# Patient Record
Sex: Male | Born: 1955 | Race: Black or African American | Hispanic: No | Marital: Married | Smoking: Current some day smoker
Health system: Southern US, Community
[De-identification: ages and names within clinical notes are randomized; demographics above are authoritative.]

## PROBLEM LIST (undated history)

## (undated) DIAGNOSIS — I77810 Thoracic aortic ectasia: Secondary | ICD-10-CM

## (undated) DIAGNOSIS — R600 Localized edema: Secondary | ICD-10-CM

## (undated) DIAGNOSIS — I5022 Chronic systolic (congestive) heart failure: Secondary | ICD-10-CM

## (undated) DIAGNOSIS — F419 Anxiety disorder, unspecified: Secondary | ICD-10-CM

## (undated) DIAGNOSIS — I513 Intracardiac thrombosis, not elsewhere classified: Secondary | ICD-10-CM

## (undated) DIAGNOSIS — E785 Hyperlipidemia, unspecified: Secondary | ICD-10-CM

## (undated) DIAGNOSIS — F1490 Cocaine use, unspecified, uncomplicated: Secondary | ICD-10-CM

## (undated) DIAGNOSIS — I480 Paroxysmal atrial fibrillation: Secondary | ICD-10-CM

## (undated) DIAGNOSIS — N1832 Chronic kidney disease, stage 3b: Secondary | ICD-10-CM

## (undated) DIAGNOSIS — Z91199 Patient's noncompliance with other medical treatment and regimen due to unspecified reason: Secondary | ICD-10-CM

## (undated) DIAGNOSIS — I428 Other cardiomyopathies: Secondary | ICD-10-CM

## (undated) DIAGNOSIS — I10 Essential (primary) hypertension: Secondary | ICD-10-CM

## (undated) DIAGNOSIS — I739 Peripheral vascular disease, unspecified: Secondary | ICD-10-CM

## (undated) DIAGNOSIS — I509 Heart failure, unspecified: Secondary | ICD-10-CM

## (undated) DIAGNOSIS — I340 Nonrheumatic mitral (valve) insufficiency: Secondary | ICD-10-CM

## (undated) HISTORY — DX: Thoracic aortic ectasia: I77.810

## (undated) HISTORY — DX: Anxiety disorder, unspecified: F41.9

## (undated) HISTORY — DX: Chronic kidney disease, stage 3b: N18.32

## (undated) HISTORY — DX: Chronic systolic (congestive) heart failure: I50.22

## (undated) HISTORY — DX: Essential (primary) hypertension: I10

## (undated) HISTORY — DX: Nonrheumatic mitral (valve) insufficiency: I34.0

## (undated) HISTORY — DX: Intracardiac thrombosis, not elsewhere classified: I51.3

## (undated) HISTORY — DX: Other cardiomyopathies: I42.8

## (undated) HISTORY — DX: Hyperlipidemia, unspecified: E78.5

## (undated) HISTORY — DX: Paroxysmal atrial fibrillation: I48.0

## (undated) HISTORY — DX: Patient's noncompliance with other medical treatment and regimen due to unspecified reason: Z91.199

## (undated) HISTORY — DX: Peripheral vascular disease, unspecified: I73.9

## (undated) HISTORY — PX: HERNIA REPAIR: SHX51

## (undated) HISTORY — DX: Cocaine use, unspecified, uncomplicated: F14.90

---

## 2001-12-11 ENCOUNTER — Emergency Department (HOSPITAL_COMMUNITY): Admission: EM | Admit: 2001-12-11 | Discharge: 2001-12-11 | Payer: Self-pay | Admitting: *Deleted

## 2001-12-11 ENCOUNTER — Encounter: Payer: Self-pay | Admitting: *Deleted

## 2003-10-16 ENCOUNTER — Emergency Department (HOSPITAL_COMMUNITY): Admission: EM | Admit: 2003-10-16 | Discharge: 2003-10-16 | Payer: Self-pay | Admitting: Emergency Medicine

## 2006-06-28 ENCOUNTER — Encounter: Payer: Self-pay | Admitting: Emergency Medicine

## 2006-06-28 ENCOUNTER — Inpatient Hospital Stay (HOSPITAL_COMMUNITY): Admission: AD | Admit: 2006-06-28 | Discharge: 2006-07-03 | Payer: Self-pay | Admitting: General Surgery

## 2006-07-07 ENCOUNTER — Emergency Department (HOSPITAL_COMMUNITY): Admission: EM | Admit: 2006-07-07 | Discharge: 2006-07-07 | Payer: Self-pay | Admitting: Hematology and Oncology

## 2006-07-11 ENCOUNTER — Emergency Department (HOSPITAL_COMMUNITY): Admission: EM | Admit: 2006-07-11 | Discharge: 2006-07-11 | Payer: Self-pay | Admitting: Emergency Medicine

## 2008-11-13 ENCOUNTER — Emergency Department (HOSPITAL_COMMUNITY): Admission: EM | Admit: 2008-11-13 | Discharge: 2008-11-13 | Payer: Self-pay | Admitting: Emergency Medicine

## 2010-02-08 ENCOUNTER — Encounter: Payer: Self-pay | Admitting: General Surgery

## 2010-04-23 LAB — POCT I-STAT, CHEM 8
BUN: 12 mg/dL (ref 6–23)
Calcium, Ion: 1.07 mmol/L — ABNORMAL LOW (ref 1.12–1.32)
Chloride: 105 mEq/L (ref 96–112)
Creatinine, Ser: 1 mg/dL (ref 0.4–1.5)
Glucose, Bld: 131 mg/dL — ABNORMAL HIGH (ref 70–99)
HCT: 37 % — ABNORMAL LOW (ref 39.0–52.0)
Hemoglobin: 12.6 g/dL — ABNORMAL LOW (ref 13.0–17.0)
Potassium: 3.3 mEq/L — ABNORMAL LOW (ref 3.5–5.1)
Sodium: 140 mEq/L (ref 135–145)
TCO2: 22 mmol/L (ref 0–100)
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
## 2010-11-05 LAB — CBC
HCT: 39.8
HCT: 41.5
HCT: 42.1
Hemoglobin: 13.6
Hemoglobin: 14.6
Hemoglobin: 14.6
MCHC: 34
MCHC: 34.6
MCHC: 35.3
MCV: 94.3
MCV: 94.4
MCV: 96.1
Platelets: 202
Platelets: 260
Platelets: 269
RBC: 4.15 — ABNORMAL LOW
RBC: 4.39
RBC: 4.46
RDW: 13.2
RDW: 13.3
RDW: 13.6
WBC: 10
WBC: 12 — ABNORMAL HIGH
WBC: 14.1 — ABNORMAL HIGH

## 2010-11-05 LAB — BASIC METABOLIC PANEL
BUN: 12
BUN: 3 — ABNORMAL LOW
BUN: 8
CO2: 20
CO2: 27
CO2: 29
Calcium: 8.1 — ABNORMAL LOW
Calcium: 8.2 — ABNORMAL LOW
Calcium: 8.3 — ABNORMAL LOW
Chloride: 103
Chloride: 107
Chloride: 108
Creatinine, Ser: 0.87
Creatinine, Ser: 0.96
Creatinine, Ser: 1.11
GFR calc Af Amer: >60
GFR calc Af Amer: >60
GFR calc Af Amer: >60
GFR calc non Af Amer: >60
GFR calc non Af Amer: >60
GFR calc non Af Amer: >60
Glucose, Bld: 119 — ABNORMAL HIGH
Glucose, Bld: 132 — ABNORMAL HIGH
Glucose, Bld: 135 — ABNORMAL HIGH
Potassium: 3.4 — ABNORMAL LOW
Potassium: 3.4 — ABNORMAL LOW
Potassium: 3.9
Sodium: 137
Sodium: 138
Sodium: 139

## 2010-11-05 LAB — URINALYSIS, ROUTINE W REFLEX MICROSCOPIC
Bilirubin Urine: NEGATIVE
Glucose, UA: NEGATIVE
Hgb urine dipstick: NEGATIVE
Ketones, ur: NEGATIVE
Nitrite: NEGATIVE
Protein, ur: NEGATIVE
Specific Gravity, Urine: 1.02
Urobilinogen, UA: 1
pH: 6

## 2010-11-05 LAB — DIFFERENTIAL
Basophils Absolute: 0
Basophils Relative: 0
Eosinophils Absolute: 0
Eosinophils Relative: 0
Lymphocytes Relative: 17
Lymphs Abs: 2.1
Monocytes Absolute: 0.7
Monocytes Relative: 6
Neutro Abs: 9.2 — ABNORMAL HIGH
Neutrophils Relative %: 76

## 2010-11-05 LAB — RAPID URINE DRUG SCREEN, HOSP PERFORMED
Amphetamines: NOT DETECTED
Barbiturates: NOT DETECTED
Benzodiazepines: NOT DETECTED
Cocaine: POSITIVE — AB
Opiates: POSITIVE — AB
Tetrahydrocannabinol: NOT DETECTED

## 2010-11-05 LAB — PROTIME-INR
INR: 1
Prothrombin Time: 13.2

## 2010-11-05 LAB — HEPATIC FUNCTION PANEL
ALT: 27
AST: 26
Albumin: 3.3 — ABNORMAL LOW
Alkaline Phosphatase: 76
Bilirubin, Direct: 0.2
Indirect Bilirubin: 0.3
Total Bilirubin: 0.5
Total Protein: 6.6

## 2010-11-05 LAB — ETHANOL: Alcohol, Ethyl (B): 113 — ABNORMAL HIGH

## 2010-11-05 LAB — APTT: aPTT: 29

## 2011-07-07 ENCOUNTER — Inpatient Hospital Stay (HOSPITAL_COMMUNITY)
Admission: EM | Admit: 2011-07-07 | Discharge: 2011-07-10 | DRG: 287 | Disposition: A | Discharge status: Home / Self Care | Payer: 59 | Source: Ambulatory Visit | Attending: Family Medicine | Admitting: Family Medicine

## 2011-07-07 ENCOUNTER — Encounter (HOSPITAL_COMMUNITY): Payer: Self-pay | Admitting: Emergency Medicine

## 2011-07-07 ENCOUNTER — Emergency Department (HOSPITAL_COMMUNITY): Payer: 59

## 2011-07-07 DIAGNOSIS — E119 Type 2 diabetes mellitus without complications: Secondary | ICD-10-CM | POA: Diagnosis present

## 2011-07-07 DIAGNOSIS — I4729 Other ventricular tachycardia: Secondary | ICD-10-CM | POA: Diagnosis present

## 2011-07-07 DIAGNOSIS — E1169 Type 2 diabetes mellitus with other specified complication: Secondary | ICD-10-CM

## 2011-07-07 DIAGNOSIS — J90 Pleural effusion, not elsewhere classified: Secondary | ICD-10-CM | POA: Diagnosis present

## 2011-07-07 DIAGNOSIS — I5022 Chronic systolic (congestive) heart failure: Secondary | ICD-10-CM

## 2011-07-07 DIAGNOSIS — I452 Bifascicular block: Secondary | ICD-10-CM | POA: Diagnosis present

## 2011-07-07 DIAGNOSIS — E1121 Type 2 diabetes mellitus with diabetic nephropathy: Secondary | ICD-10-CM

## 2011-07-07 DIAGNOSIS — R791 Abnormal coagulation profile: Secondary | ICD-10-CM | POA: Diagnosis present

## 2011-07-07 DIAGNOSIS — I10 Essential (primary) hypertension: Secondary | ICD-10-CM

## 2011-07-07 DIAGNOSIS — I472 Ventricular tachycardia, unspecified: Secondary | ICD-10-CM | POA: Diagnosis present

## 2011-07-07 DIAGNOSIS — I509 Heart failure, unspecified: Secondary | ICD-10-CM | POA: Diagnosis present

## 2011-07-07 DIAGNOSIS — I5021 Acute systolic (congestive) heart failure: Principal | ICD-10-CM

## 2011-07-07 DIAGNOSIS — I428 Other cardiomyopathies: Secondary | ICD-10-CM | POA: Diagnosis present

## 2011-07-07 DIAGNOSIS — R0789 Other chest pain: Secondary | ICD-10-CM | POA: Diagnosis present

## 2011-07-07 DIAGNOSIS — F17200 Nicotine dependence, unspecified, uncomplicated: Secondary | ICD-10-CM | POA: Diagnosis present

## 2011-07-07 HISTORY — DX: Localized edema: R60.0

## 2011-07-07 LAB — CBC
HCT: 44.3 % (ref 39.0–52.0)
Hemoglobin: 15 g/dL (ref 13.0–17.0)
MCH: 33.2 pg (ref 26.0–34.0)
MCHC: 33.9 g/dL (ref 30.0–36.0)
MCV: 98 fL (ref 78.0–100.0)
Platelets: 189 10*3/uL (ref 150–400)
RBC: 4.52 MIL/uL (ref 4.22–5.81)
RDW: 14.3 % (ref 11.5–15.5)
WBC: 7.3 10*3/uL (ref 4.0–10.5)

## 2011-07-07 LAB — DIFFERENTIAL
Basophils Absolute: 0 10*3/uL (ref 0.0–0.1)
Basophils Relative: 0 % (ref 0–1)
Eosinophils Absolute: 0 10*3/uL (ref 0.0–0.7)
Eosinophils Relative: 0 % (ref 0–5)
Lymphocytes Relative: 22 % (ref 12–46)
Lymphs Abs: 1.6 10*3/uL (ref 0.7–4.0)
Monocytes Absolute: 0.7 10*3/uL (ref 0.1–1.0)
Monocytes Relative: 9 % (ref 3–12)
Neutro Abs: 5 10*3/uL (ref 1.7–7.7)
Neutrophils Relative %: 68 % (ref 43–77)

## 2011-07-07 LAB — BASIC METABOLIC PANEL
BUN: 7 mg/dL (ref 6–23)
CO2: 20 mEq/L (ref 19–32)
Calcium: 8.5 mg/dL (ref 8.4–10.5)
Chloride: 105 mEq/L (ref 96–112)
Creatinine, Ser: 0.98 mg/dL (ref 0.50–1.35)
GFR calc Af Amer: >90 mL/min (ref >90)
GFR calc non Af Amer: >90 mL/min (ref >90)
Glucose, Bld: 144 mg/dL — ABNORMAL HIGH (ref 70–99)
Potassium: 3.6 mEq/L (ref 3.5–5.1)
Sodium: 138 mEq/L (ref 135–145)

## 2011-07-07 LAB — PRO B NATRIURETIC PEPTIDE: Pro B Natriuretic peptide (BNP): 6821 pg/mL — ABNORMAL HIGH (ref 0–125)

## 2011-07-07 LAB — TROPONIN I: Troponin I: <0.3 ng/mL (ref <0.30)

## 2011-07-07 MED ORDER — POTASSIUM CHLORIDE CRYS ER 10 MEQ PO TBCR
10.0000 meq | EXTENDED_RELEASE_TABLET | Freq: Every day | ORAL | Status: DC
  Administered 2011-07-07 – 2011-07-10 (×3): 10 meq via ORAL
  Filled 2011-07-07 (×3): qty 1

## 2011-07-07 MED ORDER — FUROSEMIDE 10 MG/ML IJ SOLN: 40.0000 mg | Freq: Once | INTRAMUSCULAR | Status: DC

## 2011-07-07 MED ORDER — ALPRAZOLAM 0.5 MG PO TABS
0.5000 mg | ORAL_TABLET | Freq: Three times a day (TID) | ORAL | Status: DC | PRN
  Administered 2011-07-07 – 2011-07-09 (×4): 0.5 mg via ORAL
  Filled 2011-07-07 (×4): qty 1

## 2011-07-07 MED ORDER — IPRATROPIUM BROMIDE 0.02 % IN SOLN
0.5000 mg | Freq: Once | RESPIRATORY_TRACT | Status: AC
  Administered 2011-07-07: 0.5 mg via RESPIRATORY_TRACT
  Filled 2011-07-07: qty 2.5

## 2011-07-07 MED ORDER — SODIUM CHLORIDE 0.9 % IV SOLN: INTRAVENOUS | Status: DC

## 2011-07-07 MED ORDER — SODIUM CHLORIDE 0.9 % IV SOLN: 250.0000 mL | INTRAVENOUS | Status: DC | PRN

## 2011-07-07 MED ORDER — FUROSEMIDE 10 MG/ML IJ SOLN
40.0000 mg | Freq: Once | INTRAMUSCULAR | Status: AC
Start: 2011-07-07 — End: 2011-07-07
  Administered 2011-07-07: 40 mg via INTRAVENOUS
  Filled 2011-07-07: qty 4

## 2011-07-07 MED ORDER — LOSARTAN POTASSIUM 50 MG PO TABS
50.0000 mg | ORAL_TABLET | Freq: Every day | ORAL | Status: DC
  Administered 2011-07-07 – 2011-07-10 (×4): 50 mg via ORAL
  Filled 2011-07-07 (×4): qty 1

## 2011-07-07 MED ORDER — ASPIRIN 81 MG PO CHEW: 324.0000 mg | CHEWABLE_TABLET | ORAL | Status: DC

## 2011-07-07 MED ORDER — CARVEDILOL 6.25 MG PO TABS
6.2500 mg | ORAL_TABLET | Freq: Two times a day (BID) | ORAL | Status: DC
  Administered 2011-07-07 – 2011-07-10 (×6): 6.25 mg via ORAL
  Filled 2011-07-07: qty 1
  Filled 2011-07-07: qty 2
  Filled 2011-07-07: qty 1
  Filled 2011-07-07: qty 2
  Filled 2011-07-07 (×2): qty 1
  Filled 2011-07-07: qty 2
  Filled 2011-07-07: qty 1

## 2011-07-07 MED ORDER — ALBUTEROL SULFATE (5 MG/ML) 0.5% IN NEBU
2.5000 mg | INHALATION_SOLUTION | Freq: Once | RESPIRATORY_TRACT | Status: AC
  Administered 2011-07-07: 2.5 mg via RESPIRATORY_TRACT
  Filled 2011-07-07: qty 0.5

## 2011-07-07 MED ORDER — METFORMIN HCL 500 MG PO TABS
500.0000 mg | ORAL_TABLET | Freq: Every day | ORAL | Status: DC
  Administered 2011-07-07 – 2011-07-08 (×2): 500 mg via ORAL
  Filled 2011-07-07 (×3): qty 1

## 2011-07-07 MED ORDER — ONDANSETRON HCL 4 MG/2ML IJ SOLN: 4.0000 mg | Freq: Three times a day (TID) | INTRAMUSCULAR | Status: AC | PRN

## 2011-07-07 MED ORDER — SODIUM CHLORIDE 0.9 % IJ SOLN: 3.0000 mL | INTRAMUSCULAR | Status: DC | PRN

## 2011-07-07 MED ORDER — SODIUM CHLORIDE 0.9 % IJ SOLN
3.0000 mL | Freq: Two times a day (BID) | INTRAMUSCULAR | Status: DC
  Administered 2011-07-07 – 2011-07-08 (×3): 3 mL via INTRAVENOUS
  Filled 2011-07-07 (×2): qty 3

## 2011-07-07 MED ORDER — CLOPIDOGREL BISULFATE 75 MG PO TABS
75.0000 mg | ORAL_TABLET | Freq: Once | ORAL | Status: AC
  Administered 2011-07-08: 75 mg via ORAL
  Filled 2011-07-07: qty 1

## 2011-07-07 MED ORDER — SODIUM CHLORIDE 0.45 % IV SOLN
INTRAVENOUS | Status: DC
  Administered 2011-07-07: 12:00:00 via INTRAVENOUS

## 2011-07-07 MED ORDER — FUROSEMIDE 10 MG/ML IJ SOLN
40.0000 mg | Freq: Two times a day (BID) | INTRAMUSCULAR | Status: DC
  Administered 2011-07-07 – 2011-07-09 (×5): 40 mg via INTRAVENOUS
  Filled 2011-07-07 (×7): qty 4

## 2011-07-07 NOTE — ED Notes (Signed)
Patient complaining of shortness of breath since last night. Also complaining of cough x 2 weeks. Denies pain.

## 2011-07-07 NOTE — H&P (Signed)
NAME:  [name] [name], [name] [name]              ACCOUNT NO.:  [id number]  MEDICAL RECORD NO.:  [id number]  LOCATION:  A335                          FACILITY:  [facility]  PHYSICIAN:  Avonda Toso G. Renard Matter, MD   DATE OF BIRTH:  10/28/1955  DATE OF ADMISSION:  07/07/2011 DATE OF DISCHARGE:  LH                             HISTORY & PHYSICAL   This 56 year old Afro American male was admitted through the ED with a chief complaint being chest tightness and shortness of breath which had been present for several days.  The patient was seen and evaluated by ED physician.  It was noted he had a nonproductive cough and dyspnea with exertion and when he attempts to lie down flat, he has some more shortness of breath.  X-rays at the time of his admission showed evidence of an enlarged cardiac silhouette with pulmonary vascular congestion.  Bibasilar atelectasis with small pleural effusions.  The patient did have an elevated proBNP 6821.  He was felt that he was in congestive heart failure.  Subsequently was admitted.  An EKG done showed frequent PVCs, right bundle branch block, left anterior fascicular block.  The patient was started on IV furosemide on admission.  SOCIAL HISTORY:  The patient does smoke approximately half a pack a day. Does use alcohol on a regular basis.  PAST MEDICAL HISTORY:  The patient does have a history of diabetes mellitus type 2.  PAST SURGICAL HISTORY:  The patient has a history of hernia repair.  ALLERGIES:  The patient has no known allergies.  REVIEW OF SYSTEMS:  HEENT:  Negative.  CARDIOPULMONARY:  The patient has some shortness of breath and some orthopnea.  GI:  No nausea, vomiting, or diarrhea.  GU:  No dysuria, hematuria.  EXAMINATION:  GENERAL:  Alert male. VITAL SIGNS:  Blood pressure 144/78, respirations 18, pulse 47, temp 97.8. HEENT:  Eyes, PERRLA.  TMs negative. OROPHARYNX:  Benign. NECK:  Supple.  No JVD or thyroid abnormalities. LUNGS:  Clear to P and A. HEART:   Regular rhythm.  No murmurs. ABDOMEN:  No palpable organs or masses.  No organomegaly. EXTREMITIES:  1+ edema bilaterally. NEUROLOGIC:  Cranial nerves intact.  No motor or sensory abnormalities. No abnormal reflexes.  ASSESSMENT:  The patient was admitted with what was felt to be congestive heart failure.  PLAN:  To obtain Cardiology consult to start IV furosemide for diuresis. Continue to monitor daily weights.  MEDICATION LIST:  Metformin 500 mg daily.  Potassium chloride 10 mEq daily.     Syd Manges G. Renard Matter, MD     AGM/MEDQ  D:  07/07/2011  T:  07/07/2011  Job:  [id number]

## 2011-07-07 NOTE — Consult Note (Signed)
Reason for Consult:CHF Referring Physician: [name] is an 56 y.o. male.  [name] is a very pleasant 56 year old African American male patient with no prior cardiac history who was admitted with congestive heart failure. He complains of 2-3 day history of cough that gradually worsened and shortness of breath when laying down. He was walking up a flight of stairs at work today where he'd completely gave out of breath so drove himself to the emergency room. His BNP is 6821 and chest x-ray shows pulmonary vascular congestion. The patient does admit to several week history of chest tightness when doing his yard work. He attributed this to the pollen.   Patient has history of diabetes mellitus and smokes a half a pack of cigarettes a day for the past 20 years he also drinks 5-6 beers daily. Has a family history of coronary artery disease. He's had occasional leg swelling over the years.  Past Medical History  Diagnosis Date  . Diabetes mellitus   . Fluid retention in legs     Past Surgical History  Procedure Date  . Hernia repair     History reviewed. No pertinent family history.  Social History:  reports that he has been smoking.  He does not have any smokeless tobacco history on file. He reports that he drinks alcohol. He reports that he does not use illicit drugs.  Allergies: No Known Allergies  Medications:  Scheduled Meds:   . albuterol  2.5 mg Nebulization Once  . furosemide  40 mg Intravenous Once  . furosemide  40 mg Intravenous BID  . ipratropium  0.5 mg Nebulization Once  . metFORMIN  500 mg Oral Q breakfast  . potassium chloride  10 mEq Oral Daily  . DISCONTD: furosemide  40 mg Intramuscular Once   Continuous Infusions:   . sodium chloride 50 mL/hr at 07/07/11 1155   PRN Meds:.ALPRAZolam, ondansetron (ZOFRAN) IV   Results for orders placed during the hospital encounter of 07/07/11 (from the past 48 hour(s))  CBC     Status: Normal   Collection Time    07/07/11  6:59 AM      Component Value Range Comment   WBC 7.3  4.0 - 10.5 K/uL    RBC 4.52  4.22 - 5.81 MIL/uL    Hemoglobin 15.0  13.0 - 17.0 g/dL    HCT 45.4  09.8 - 11.9 %    MCV 98.0  78.0 - 100.0 fL    MCH 33.2  26.0 - 34.0 pg    MCHC 33.9  30.0 - 36.0 g/dL    RDW 14.7  82.9 - 56.2 %    Platelets 189  150 - 400 K/uL   DIFFERENTIAL     Status: Normal   Collection Time   07/07/11  6:59 AM      Component Value Range Comment   Neutrophils Relative 68  43 - 77 %    Neutro Abs 5.0  1.7 - 7.7 K/uL    Lymphocytes Relative 22  12 - 46 %    Lymphs Abs 1.6  0.7 - 4.0 K/uL    Monocytes Relative 9  3 - 12 %    Monocytes Absolute 0.7  0.1 - 1.0 K/uL    Eosinophils Relative 0  0 - 5 %    Eosinophils Absolute 0.0  0.0 - 0.7 K/uL    Basophils Relative 0  0 - 1 %    Basophils Absolute 0.0  0.0 - 0.1 K/uL  BASIC METABOLIC PANEL     Status: Abnormal   Collection Time   07/07/11  6:59 AM      Component Value Range Comment   Sodium 138  135 - 145 mEq/L    Potassium 3.6  3.5 - 5.1 mEq/L    Chloride 105  96 - 112 mEq/L    CO2 20  19 - 32 mEq/L    Glucose, Bld 144 (*) 70 - 99 mg/dL    BUN 7  6 - 23 mg/dL    Creatinine, Ser 1.61  0.50 - 1.35 mg/dL    Calcium 8.5  8.4 - 09.6 mg/dL    GFR calc non Af Amer >90  >90 mL/min    GFR calc Af Amer >90  >90 mL/min   PRO B NATRIURETIC PEPTIDE     Status: Abnormal   Collection Time   07/07/11  6:59 AM      Component Value Range Comment   Pro B Natriuretic peptide (BNP) 6821.0 (*) 0 - 125 pg/mL   TROPONIN I     Status: Normal   Collection Time   07/07/11  6:59 AM      Component Value Range Comment   Troponin I <0.30  <0.30 ng/mL     Dg Chest 2 View  07/07/2011  *RADIOLOGY REPORT*  Clinical Data: Shortness of breath, smoker, cough  CHEST - 2 VIEW  Comparison: 06/28/2006  Findings: Mild enlargement of cardiac silhouette. Pulmonary vascular congestion. Small pleural effusions and bibasilar atelectasis. Peribronchial thickening. No pneumothorax.  Unremarkable.  IMPRESSION: Enlargement of cardiac silhouette with pulmonary vascular congestion. Bibasilar atelectasis and small pleural effusions.  Original Report Authenticated By: Lollie Marrow, M.D.    ROS See HPI Eyes: Negative Ears:Negative for hearing loss, tinnitus Cardiovascular: He has occasional palpitations when he gets anxious,Negative for near-syncope, orthopnea, paroxysmal nocturnal dyspnea and syncope, claudication, cyanosis,.  Respiratory:   Negative for hemoptysis, shortness of breath, sleep disturbances due to breathing, sputum production and wheezing.   Endocrine: Negative for cold intolerance and heat intolerance.  Hematologic/Lymphatic: Negative for adenopathy and bleeding problem. Does not bruise/bleed easily.  Musculoskeletal: Negative.   Gastrointestinal: Negative for nausea, vomiting, reflux, abdominal pain, diarrhea, constipation.   Genitourinary: Negative for bladder incontinence, dysuria, flank pain, frequency, hematuria, hesitancy, nocturia and urgency.  Neurological: Negative.  Allergic/Immunologic: Negative for environmental allergies.  Blood pressure 151/90, pulse 80, temperature 98.2 F (36.8 C), resp. rate 18, height 6' (1.829 m), weight 199 lb 4.8 oz (90.402 kg), SpO2 97.00%. Physical Exam PHYSICAL EXAM: Well-nournished, in no acute distress. Neck: slight increased JVD, no HJR, Bruit, or thyroid enlargement Lungs: decreased breath sounds with bibasilar Rales right greater than left Cardiovascular: RRR with some skipping, positive S3 and S4 gallop, 1-2/6 systolic murmur at the left sternal border, no bruit, thrill, or heave. Abdomen: BS normal. Soft without organomegaly, masses, lesions or tenderness. Extremities: without cyanosis, clubbing or edema. Good distal pulses bilateral SKin: Warm, no lesions or rashes  Musculoskeletal: No deformities Neuro: no focal signs  EAV:WUJWJX sinus rhythm with frequent PVCs, incomplete right bundle branch  block  Assessment/Plan: CHF: we'll need to diurese, stop IV fluids, and check 2-D echo for LV function. Could be ischemic. Hypertension: patient has no history of high blood pressure but hasn't been elevated since admission. Add low dose Coreg. Chest pain worrisome for ischemia: will likely need further assessment with cardiac catheterization. He is on the schedule for this Friday, June 21st for right and left heart cath with Dr. [place]  at 12:00 Diabetes mellitus Tobacco abuse: smoking cessation. Patient would like to try Chantix. ETOH: have discussed decreasing intake.  Jacolyn Reedy 07/07/2011, 12:04 PM    Patient examined and chart reviewed.  Long discussion with patient and wife.  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  Responding to diuresis with less orthopnea and edema.  Echo pending from today but suspect EF will be at least modrately reduced.  Tentatively planned right and left heart cath in [place] on Friday with Dr [place].  Indicated to patient that heart will not improve unless he stops drinking.  Can be transferred tomorrow or day of cath.  Continue to titrate meds.  Pre cath orders written and case scheduled in [facility] around 12:30 6/21  Charlton Haws 3:27 PM 07/07/2011

## 2011-07-07 NOTE — ED Provider Notes (Signed)
History     CSN: [id number]  Arrival date & time 07/07/11  6213   First MD Initiated Contact with Patient 07/07/11 [id number]      Chief Complaint  Patient presents with  . Shortness of Breath    (Consider location/radiation/quality/duration/timing/severity/associated sxs/prior treatment) Patient is a 56 y.o. male presenting with shortness of breath. The history is provided by the patient.  Shortness of Breath  Associated symptoms include shortness of breath.  He has been having shortness of breath and cough for the last 2 days. Cough is nonproductive. He denies fever, chills, sweats. There has been some mild tightness in his chest. He denies nausea, vomiting, diaphoresis. He thinks that he's been exposed to too much pollen. Dyspnea is worse if he lays flat and if he exerts himself. Nothing makes the symptoms any better. He has not taken any medication for her.  Past Medical History  Diagnosis Date  . Diabetes mellitus   . Fluid retention in legs     Past Surgical History  Procedure Date  . Hernia repair     History reviewed. No pertinent family history.  History  Substance Use Topics  . Smoking status: Current Everyday Smoker -- 0.5 packs/day  . Smokeless tobacco: Not on file  . Alcohol Use: Yes     weekends      Review of Systems  Respiratory: Positive for shortness of breath.   All other systems reviewed and are negative.    Allergies  Review of patient's allergies indicates no known allergies.  Home Medications   Current Outpatient Rx  Name Route Sig Dispense Refill  . METFORMIN HCL 500 MG PO TABS Oral Take 500 mg by mouth daily.    Marland Kitchen POTASSIUM CHLORIDE CRYS ER 10 MEQ PO TBCR Oral Take 10 mEq by mouth daily.      BP 158/117  Pulse 96  Temp 97.8 F (36.6 C)  Resp 20  Ht 6' (1.829 m)  Wt 215 lb (97.523 kg)  BMI 29.16 kg/m2  SpO2 95%  Physical Exam  Nursing note and vitals reviewed.  56 year old male who is resting comfortably and in no acute  distress. Vital signs are significant for hypertension with blood pressure 158/117. Oxygen saturation is 95% which is normal. Head is normocephalic and atraumatic. PERRLA, EOMI. Neck is nontender and supple. No JVD seen. Back is nontender. Lungs have a slightly prolonged exhalation phase but no overt rales, wheezes, or rhonchi. Heart has regular rate and rhythm without murmur. Abdomen is soft, flat, nontender without masses or hepatosplenomegaly. Extremities have 1+ edema, no cyanosis. Skin is warm and dry without rash. Neurologic: Mental status is normal, cranial nerves are intact, there are no motor or sensory deficits.  ED Course  Procedures (including critical care time)  Results for orders placed during the hospital encounter of 07/07/11  CBC      Component Value Range   WBC 7.3  4.0 - 10.5 K/uL   RBC 4.52  4.22 - 5.81 MIL/uL   Hemoglobin 15.0  13.0 - 17.0 g/dL   HCT 78.4  69.6 - 29.5 %   MCV 98.0  78.0 - 100.0 fL   MCH 33.2  26.0 - 34.0 pg   MCHC 33.9  30.0 - 36.0 g/dL   RDW 28.4  13.2 - 44.0 %   Platelets 189  150 - 400 K/uL  DIFFERENTIAL      Component Value Range   Neutrophils Relative 68  43 - 77 %   Neutro Abs 5.0  1.7 - 7.7 K/uL   Lymphocytes Relative 22  12 - 46 %   Lymphs Abs 1.6  0.7 - 4.0 K/uL   Monocytes Relative 9  3 - 12 %   Monocytes Absolute 0.7  0.1 - 1.0 K/uL   Eosinophils Relative 0  0 - 5 %   Eosinophils Absolute 0.0  0.0 - 0.7 K/uL   Basophils Relative 0  0 - 1 %   Basophils Absolute 0.0  0.0 - 0.1 K/uL  BASIC METABOLIC PANEL      Component Value Range   Sodium 138  135 - 145 mEq/L   Potassium 3.6  3.5 - 5.1 mEq/L   Chloride 105  96 - 112 mEq/L   CO2 20  19 - 32 mEq/L   Glucose, Bld 144 (*) 70 - 99 mg/dL   BUN 7  6 - 23 mg/dL   Creatinine, Ser 1.61  0.50 - 1.35 mg/dL   Calcium 8.5  8.4 - 09.6 mg/dL   GFR calc non Af Amer >90  >90 mL/min   GFR calc Af Amer >90  >90 mL/min  PRO B NATRIURETIC PEPTIDE      Component Value Range   Pro B Natriuretic  peptide (BNP) 6821.0 (*) 0 - 125 pg/mL  TROPONIN I      Component Value Range   Troponin I <0.30  <0.30 ng/mL   Dg Chest 2 View  07/07/2011  *RADIOLOGY REPORT*  Clinical Data: Shortness of breath, smoker, cough  CHEST - 2 VIEW  Comparison: 06/28/2006  Findings: Mild enlargement of cardiac silhouette. Pulmonary vascular congestion. Small pleural effusions and bibasilar atelectasis. Peribronchial thickening. No pneumothorax. Unremarkable.  IMPRESSION: Enlargement of cardiac silhouette with pulmonary vascular congestion. Bibasilar atelectasis and small pleural effusions.  Original Report Authenticated By: Lollie Marrow, M.D.      Date: 07/07/2011  Rate: 94  Rhythm: normal sinus rhythm and premature ventricular contractions (PVC)  QRS Axis: left  Intervals: QT prolonged  ST/T Wave abnormalities: normal  Conduction Disutrbances right bundle branch block, left anterior fascicular block  Narrative Interpretation: Frequent PVCs, right bundle branch block, left anterior fascicular block. When compared with ECG of 06/28/2006, right bundle-branch block and PVCs are now present.  Old EKG Reviewed: changes noted    1. CHF (congestive heart failure)       MDM  Dyspnea which seems most consistent with acute bronchitis. Chest x-ray and BNP will be obtained to rule out CHF. He will be given a therapeutic trial of albuterol with Atrovent.  He got no relief with albuterol and Atrovent. Laboratory workup and chest x-ray seem most consistent with CHF. Since this is new onset CHF, he should be admitted. Case is discussed with Dr. Renard Matter who agrees to admit him. Treatment was started in the emergency department with a dose of Lasix.      Dione Booze, MD 07/07/11 [phone]

## 2011-07-08 ENCOUNTER — Encounter (HOSPITAL_COMMUNITY): Payer: Self-pay | Admitting: Adult Health

## 2011-07-08 DIAGNOSIS — I517 Cardiomegaly: Secondary | ICD-10-CM

## 2011-07-08 DIAGNOSIS — I509 Heart failure, unspecified: Secondary | ICD-10-CM

## 2011-07-08 DIAGNOSIS — I10 Essential (primary) hypertension: Secondary | ICD-10-CM

## 2011-07-08 DIAGNOSIS — E1169 Type 2 diabetes mellitus with other specified complication: Secondary | ICD-10-CM

## 2011-07-08 DIAGNOSIS — E1121 Type 2 diabetes mellitus with diabetic nephropathy: Secondary | ICD-10-CM

## 2011-07-08 DIAGNOSIS — I5022 Chronic systolic (congestive) heart failure: Secondary | ICD-10-CM

## 2011-07-08 DIAGNOSIS — E119 Type 2 diabetes mellitus without complications: Secondary | ICD-10-CM

## 2011-07-08 LAB — GLUCOSE, CAPILLARY: Glucose-Capillary: 91 mg/dL (ref 70–99)

## 2011-07-08 LAB — BASIC METABOLIC PANEL
BUN: 10 mg/dL (ref 6–23)
CO2: 25 mEq/L (ref 19–32)
Calcium: 8.7 mg/dL (ref 8.4–10.5)
Chloride: 103 mEq/L (ref 96–112)
Creatinine, Ser: 1.21 mg/dL (ref 0.50–1.35)
GFR calc Af Amer: 77 mL/min — ABNORMAL LOW (ref >90)
GFR calc non Af Amer: 66 mL/min — ABNORMAL LOW (ref >90)
Glucose, Bld: 123 mg/dL — ABNORMAL HIGH (ref 70–99)
Potassium: 3.3 mEq/L — ABNORMAL LOW (ref 3.5–5.1)
Sodium: 140 mEq/L (ref 135–145)

## 2011-07-08 MED ORDER — SODIUM CHLORIDE 0.9 % IJ SOLN
INTRAMUSCULAR | Status: AC
  Filled 2011-07-08: qty 3

## 2011-07-08 MED ORDER — SODIUM CHLORIDE 0.9 % IJ SOLN
INTRAMUSCULAR | Status: AC
  Administered 2011-07-08: 3 mL via INTRAVENOUS
  Filled 2011-07-08: qty 6

## 2011-07-08 MED ORDER — ASPIRIN 81 MG PO CHEW
324.0000 mg | CHEWABLE_TABLET | ORAL | Status: AC
  Administered 2011-07-09: 324 mg via ORAL
  Filled 2011-07-08: qty 1
  Filled 2011-07-08: qty 4
  Filled 2011-07-08: qty 3

## 2011-07-08 NOTE — Progress Notes (Signed)
SUBJECTIVE: Feeling and breathing better with no recurrence of edema.  Active Problems:  CHF (congestive heart failure)  Diabetes mellitus  Hypertension   LABS: Basic Metabolic Panel:  Basename 07/08/11 0600 07/07/11 0659  NA 140 138  K 3.3* 3.6  CL 103 105  CO2 25 20  GLUCOSE 123* 144*  BUN 10 7  CREATININE 1.21 0.98  CALCIUM 8.7 8.5  MG -- --  PHOS -- --   CBC:  Basename 07/07/11 0659  WBC 7.3  NEUTROABS 5.0  HGB 15.0  HCT 44.3  MCV 98.0  PLT 189   Cardiac Enzymes:  Basename 07/07/11 0659  CKTOTAL --  CKMB --  CKMBINDEX --  TROPONINI <0.30    RADIOLOGY: Dg Chest 2 View  07/07/2011  *RADIOLOGY REPORT*  Clinical Data: Shortness of breath, smoker, cough  CHEST - 2 VIEW  Comparison: 06/28/2006  Findings: Mild enlargement of cardiac silhouette. Pulmonary vascular congestion. Small pleural effusions and bibasilar atelectasis. Peribronchial thickening. No pneumothorax. Unremarkable.  IMPRESSION: Enlargement of cardiac silhouette with pulmonary vascular congestion. Bibasilar atelectasis and small pleural effusions.  Original Report Authenticated By: Lollie Marrow, M.D.     PHYSICAL EXAM BP 118/82  Pulse 80  Temp 97.4 F (36.3 C) (Oral)  Resp 20  Ht 6' (1.829 m)  Wt 195 lb (88.451 kg)  BMI 26.45 kg/m2  SpO2 96% General: Well developed, well nourished, in no acute distress Head: Eyes PERRLA, No xanthomas.   Normal cephalic and atramatic  Lungs: Clear bilaterally to auscultation and percussion. Heart: HRRR S1 S2 distant heart sounds.  Pulses are 2+ & equal.            No carotid bruit. No JVD.  No abdominal bruits. No femoral bruits. Abdomen: Bowel sounds are positive, abdomen soft and non-tender without masses or                  Hernia's noted. Msk:  Back normal, normal gait. Normal strength and tone for age. Extremities: No clubbing, cyanosis or edema.  DP +1 Neuro: Alert and oriented X 3. Psych:  Good affect, responds appropriately  TELEMETRY: Reviewed  telemetry pt in: NSR with PVC's  ASSESSMENT AND PLAN:  1. CHF: He is diuresed approximately 2000 cc since admission. He is breathing better with no edema noted on exam. He continues on IV Lasix 40 mg twice a day. He is mildly hypokalemic this morning and this is being repleted. He appears to be euvolemic. He is on Coreg 6.25 mg twice a day.  2. Chest pain: The patient was seen and examined by Dr.Nishan yesterday. He plans to transfer patient for cardiac catheterization in a.m. He is scheduled for 12:30 PM on 6/21. Cardiac enzymes have been found to be negative. He remains on Plavix and aspirin.  3. Hypertension: Blood pressure is much better controlled on Cozaar. He was not on any antihypertensives prior to admission. Echocardiogram is pending results.  Bettey Mare. Lyman Bishop NP [facility] 07/08/2011, 5:23 PM

## 2011-07-08 NOTE — Progress Notes (Signed)
NAME:  [name], [name]              ACCOUNT NO.:  [id number]  MEDICAL RECORD NO.:  [id number]  LOCATION:  A335                          FACILITY:  [facility]  PHYSICIAN:  Khadija Thier G. Renard Matter, MD   DATE OF BIRTH:  1955/04/01  DATE OF PROCEDURE: DATE OF DISCHARGE:                                PROGRESS NOTE   This patient was thought to have evidence of congestive heart failure and possible cardiomyopathy.  He is relatively comfortable today, had a fair night with minimal difficulty with his breathing.  X-rays on admission showed evidence of atelectasis and small pleural effusions. The patient did have elevated proBNP 6821.  OBJECTIVE:  VITAL SIGNS:  Blood pressure 131/93, respirations 20, pulse 55, temp 98.1. GENERAL:  The patient has a net urinary output -2095. LUNGS:  Diminished breath sounds. HEART:  Regular rhythm. ABDOMEN:  No palpable organs or masses.  Minimal edema.  ASSESSMENT:  The patient was admitted with congestive heart failure, cardiomyopathy.  A 2D echo will be done and he is scheduled for left heart catheterization on Friday in [place].  PLAN:  To continue current IV Lasix, we will check BMET.     [name] Yepes G. Renard Matter, MD     AGM/MEDQ  D:  07/08/2011  T:  07/08/2011  Job:  [id number]

## 2011-07-08 NOTE — Progress Notes (Signed)
*  PRELIMINARY RESULTS* Echocardiogram 2D Echocardiogram has been performed.  [name] 07/08/2011, 9:09 AM

## 2011-07-08 NOTE — Progress Notes (Signed)
UR Chart Review Completed  

## 2011-07-09 ENCOUNTER — Encounter (HOSPITAL_COMMUNITY)
Admission: EM | Disposition: A | Discharge status: Home / Self Care | Payer: Self-pay | Source: Ambulatory Visit | Attending: Family Medicine

## 2011-07-09 DIAGNOSIS — I428 Other cardiomyopathies: Secondary | ICD-10-CM

## 2011-07-09 HISTORY — PX: LEFT AND RIGHT HEART CATHETERIZATION WITH CORONARY ANGIOGRAM: SHX5449

## 2011-07-09 LAB — CREATININE, SERUM
Creatinine, Ser: 1.25 mg/dL (ref 0.50–1.35)
GFR calc Af Amer: 74 mL/min — ABNORMAL LOW (ref >90)
GFR calc non Af Amer: 64 mL/min — ABNORMAL LOW (ref >90)

## 2011-07-09 LAB — POCT I-STAT 3, VENOUS BLOOD GAS (G3P V)
Acid-Base Excess: 1 mmol/L (ref 0.0–2.0)
Bicarbonate: 25.7 mEq/L — ABNORMAL HIGH (ref 20.0–24.0)
O2 Saturation: 62 %
TCO2: 27 mmol/L (ref 0–100)
pCO2, Ven: 39.4 mmHg — ABNORMAL LOW (ref 45.0–50.0)
pH, Ven: 7.423 — ABNORMAL HIGH (ref 7.250–7.300)
pO2, Ven: 31 mmHg (ref 30.0–45.0)

## 2011-07-09 LAB — POCT I-STAT 3, ART BLOOD GAS (G3+)
Bicarbonate: 22.6 mEq/L (ref 20.0–24.0)
O2 Saturation: 93 %
TCO2: 24 mmol/L (ref 0–100)
pCO2 arterial: 32.4 mmHg — ABNORMAL LOW (ref 35.0–45.0)
pH, Arterial: 7.452 — ABNORMAL HIGH (ref 7.350–7.450)
pO2, Arterial: 63 mmHg — ABNORMAL LOW (ref 80.0–100.0)

## 2011-07-09 LAB — GLUCOSE, CAPILLARY
Glucose-Capillary: 105 mg/dL — ABNORMAL HIGH (ref 70–99)
Glucose-Capillary: 124 mg/dL — ABNORMAL HIGH (ref 70–99)

## 2011-07-09 LAB — PROTIME-INR
INR: 0.97 (ref 0.00–1.49)
Prothrombin Time: 13.1 seconds (ref 11.6–15.2)

## 2011-07-09 LAB — MAGNESIUM: Magnesium: 2.1 mg/dL (ref 1.5–2.5)

## 2011-07-09 LAB — CBC
HCT: 54.9 % — ABNORMAL HIGH (ref 39.0–52.0)
Hemoglobin: 19.1 g/dL — ABNORMAL HIGH (ref 13.0–17.0)
MCH: 34.4 pg — ABNORMAL HIGH (ref 26.0–34.0)
MCHC: 34.8 g/dL (ref 30.0–36.0)
MCV: 98.7 fL (ref 78.0–100.0)
Platelets: 243 10*3/uL (ref 150–400)
RBC: 5.56 MIL/uL (ref 4.22–5.81)
RDW: 14.2 % (ref 11.5–15.5)
WBC: 7.6 10*3/uL (ref 4.0–10.5)

## 2011-07-09 LAB — BASIC METABOLIC PANEL
BUN: 16 mg/dL (ref 6–23)
CO2: 25 mEq/L (ref 19–32)
Calcium: 8.8 mg/dL (ref 8.4–10.5)
Chloride: 101 mEq/L (ref 96–112)
Creatinine, Ser: 1.21 mg/dL (ref 0.50–1.35)
GFR calc Af Amer: 77 mL/min — ABNORMAL LOW (ref >90)
GFR calc non Af Amer: 66 mL/min — ABNORMAL LOW (ref >90)
Glucose, Bld: 118 mg/dL — ABNORMAL HIGH (ref 70–99)
Potassium: 3.1 mEq/L — ABNORMAL LOW (ref 3.5–5.1)
Sodium: 139 mEq/L (ref 135–145)

## 2011-07-09 LAB — LIPID PANEL
Cholesterol: 169 mg/dL (ref 0–200)
HDL: 45 mg/dL (ref >39)
LDL Cholesterol: 94 mg/dL (ref 0–99)
Total CHOL/HDL Ratio: 3.8 RATIO
Triglycerides: 152 mg/dL — ABNORMAL HIGH (ref <150)
VLDL: 30 mg/dL (ref 0–40)

## 2011-07-09 LAB — POTASSIUM: Potassium: 4 mEq/L (ref 3.5–5.1)

## 2011-07-09 SURGERY — LEFT AND RIGHT HEART CATHETERIZATION WITH CORONARY ANGIOGRAM
Anesthesia: LOCAL

## 2011-07-09 MED ORDER — SODIUM CHLORIDE 0.9 % IV SOLN
INTRAVENOUS | Status: DC
  Administered 2011-07-09: 10 mL/h via INTRAVENOUS

## 2011-07-09 MED ORDER — NICOTINE 21 MG/24HR TD PT24
21.0000 mg | MEDICATED_PATCH | Freq: Every day | TRANSDERMAL | Status: DC
  Administered 2011-07-09 – 2011-07-10 (×2): 21 mg via TRANSDERMAL
  Filled 2011-07-09 (×2): qty 1

## 2011-07-09 MED ORDER — POTASSIUM CHLORIDE 10 MEQ/100ML IV SOLN
10.0000 meq | INTRAVENOUS | Status: AC
  Administered 2011-07-09: 10 meq via INTRAVENOUS
  Filled 2011-07-09: qty 100

## 2011-07-09 MED ORDER — POTASSIUM CHLORIDE CRYS ER 20 MEQ PO TBCR
40.0000 meq | EXTENDED_RELEASE_TABLET | ORAL | Status: AC
  Administered 2011-07-09: 40 meq via ORAL
  Filled 2011-07-09: qty 2

## 2011-07-09 MED ORDER — ZOLPIDEM TARTRATE 5 MG PO TABS: 5.0000 mg | ORAL_TABLET | Freq: Every evening | ORAL | Status: DC | PRN

## 2011-07-09 MED ORDER — DIAZEPAM 5 MG PO TABS
5.0000 mg | ORAL_TABLET | Freq: Once | ORAL | Status: AC
  Administered 2011-07-09: 5 mg via ORAL
  Filled 2011-07-09: qty 1

## 2011-07-09 MED ORDER — HEPARIN SODIUM (PORCINE) 5000 UNIT/ML IJ SOLN
5000.0000 [IU] | Freq: Three times a day (TID) | INTRAMUSCULAR | Status: DC
  Administered 2011-07-09 – 2011-07-10 (×2): 5000 [IU] via SUBCUTANEOUS
  Filled 2011-07-09 (×5): qty 1

## 2011-07-09 MED ORDER — LIVING BETTER WITH HEART FAILURE BOOK
Freq: Once | Status: DC
  Filled 2011-07-09: qty 1

## 2011-07-09 MED ORDER — FUROSEMIDE 40 MG PO TABS
40.0000 mg | ORAL_TABLET | Freq: Every day | ORAL | Status: DC
  Administered 2011-07-10: 40 mg via ORAL
  Filled 2011-07-09: qty 1

## 2011-07-09 MED ORDER — CLOPIDOGREL BISULFATE 75 MG PO TABS
75.0000 mg | ORAL_TABLET | Freq: Every day | ORAL | Status: DC
  Administered 2011-07-09: 75 mg via ORAL
  Filled 2011-07-09: qty 1

## 2011-07-09 MED ORDER — ASPIRIN 81 MG PO CHEW
324.0000 mg | CHEWABLE_TABLET | Freq: Once | ORAL | Status: AC
  Administered 2011-07-09: 324 mg via ORAL
  Filled 2011-07-09: qty 4

## 2011-07-09 MED ORDER — FENTANYL CITRATE 0.05 MG/ML IJ SOLN
INTRAMUSCULAR | Status: AC
  Filled 2011-07-09: qty 2

## 2011-07-09 MED ORDER — MIDAZOLAM HCL 2 MG/2ML IJ SOLN
INTRAMUSCULAR | Status: AC
  Filled 2011-07-09: qty 2

## 2011-07-09 MED ORDER — SODIUM CHLORIDE 0.9 % IV SOLN: 1.0000 mL/kg/h | INTRAVENOUS | Status: AC

## 2011-07-09 MED ORDER — POTASSIUM CHLORIDE CRYS ER 20 MEQ PO TBCR
40.0000 meq | EXTENDED_RELEASE_TABLET | Freq: Once | ORAL | Status: AC
  Administered 2011-07-09: 40 meq via ORAL
  Filled 2011-07-09: qty 2

## 2011-07-09 NOTE — Progress Notes (Signed)
Pt had 16 beats of vtacch ,pt resting on bed, denies any discomforts, v/s stable. Theodore Demark PA notified. New labs ordered.pt kept on monitor.

## 2011-07-09 NOTE — CV Procedure (Signed)
   Cardiac Catheterization Procedure Note  Name: [name] [name] MRN: [id number] DOB: 1955-05-17  Procedure: Right Heart Cath, Left Heart Cath, Selective Coronary Angiography, LV angiography  Indication: 56 year old white male with newly diagnosed cardiomyopathy and ejection fraction of 15%. He has a history of diabetes and tobacco use.   Procedural Details: The right groin was prepped, draped, and anesthetized with 1% lidocaine. Using the modified Seldinger technique a 5 French sheath was placed in the right femoral artery and a 7 French sheath was placed in the right femoral vein. A Swan-Ganz catheter was used for the right heart catheterization. Standard protocol was followed for recording of right heart pressures and sampling of oxygen saturations. Fick cardiac output was calculated. Standard Judkins catheters were used for selective coronary angiography and left ventriculography. There were no immediate procedural complications. The patient was transferred to the post catheterization recovery area for further monitoring.  Procedural Findings: Hemodynamics RA 6/4with a mean of 2 mmHg RV 20/1 mmHg PA 31/13 with a mean of 21 mmHg PCWP 11/9 with a mean of 6 mm mercury LV 104/4 mmHg AO 98/72 with a mean of 84 mmHg  Oxygen saturations: PA 62% AO 93%  Cardiac Output (Fick) 4.42 L per minute  Cardiac Index (Fick) 2.1 L per minute per meter square   Coronary angiography: Coronary dominance: right  Left mainstem: Normal  Left anterior descending (LAD): Normal  Left circumflex (LCx): Normal  Right coronary artery (RCA): Normal  Left ventriculography: Limited injection demonstrates left ventricular enlargement with severe global hypokinesis. Ejection fraction is estimated at 15-20%.  Final Conclusions:   1. Normal coronary anatomy. 2. Severe left ventricular dysfunction. 3. Normal right heart pressures.  Recommendations: Recommend optimizing medications for congestive heart  failure. Patient needs to abstain from tobacco and alcohol. May resume metformin 48 hours post cath.  Theron Arista [facility] 07/09/2011, 3:28 PM

## 2011-07-09 NOTE — Progress Notes (Signed)
Report given to [facility] via telephone.  Verbalized understanding.  [facility] to be at [facility] within the hour to transport to [facility] for cardiac cath.  Schonewitz, Candelaria Stagers 07/09/2011

## 2011-07-09 NOTE — Progress Notes (Signed)
NAME:  [name] [name], [name] [name]              ACCOUNT NO.:  [id number]  MEDICAL RECORD NO.:  [id number]  LOCATION:  A335                          FACILITY:  [facility]  PHYSICIAN:  Temitope Flammer G. Renard Matter, MD   DATE OF BIRTH:  1955-01-25  DATE OF PROCEDURE: DATE OF DISCHARGE:                                PROGRESS NOTE   This patient was admitted with what was felt to be congestive heart failure and possible cardiomyopathy, had a fair night.  Previous x-ray showed evidence of atelectasis and pleural effusions.  He did have elevated proBNP 6821.  OBJECTIVE:  VITAL SIGNS:  Blood pressure 115/75, respirations 16, pulse 83, temp 98.2. HEART:  Regular rhythm. LUNGS:  Clear to P and A. ABDOMEN:  No palpable organs or masses. Minimal edema.  ASSESSMENT:  The patient was admitted with what was felt to be congestive heart failure.  He has been diuresing.  He has been seen by cardiologist and they planned cardiac catheterization today.  Cardiac enzymes remained negative.  Potassium was slightly low and has been repleted.  He has negative output 2955.  Lab studies have returned, again it shows a potassium of 3.1, extra potassium will be given.     [name] [name] G. Renard Matter, MD     AGM/MEDQ  D:  07/09/2011  T:  07/09/2011  Job:  [id number]

## 2011-07-09 NOTE — Progress Notes (Signed)
Patient crying out in pain from IV Potassium.  Refusing to continue.  Made [name] Reining, PA cardiology aware.  PA ordered PO supplements in place of IV.  Will carry order out as ordered.  Schonewitz, Candelaria Stagers 07/09/2011

## 2011-07-10 DIAGNOSIS — I5021 Acute systolic (congestive) heart failure: Principal | ICD-10-CM

## 2011-07-10 LAB — BASIC METABOLIC PANEL
BUN: 18 mg/dL (ref 6–23)
CO2: 22 mEq/L (ref 19–32)
Calcium: 8.6 mg/dL (ref 8.4–10.5)
Chloride: 104 mEq/L (ref 96–112)
Creatinine, Ser: 1.13 mg/dL (ref 0.50–1.35)
GFR calc Af Amer: 83 mL/min — ABNORMAL LOW (ref >90)
GFR calc non Af Amer: 72 mL/min — ABNORMAL LOW (ref >90)
Glucose, Bld: 116 mg/dL — ABNORMAL HIGH (ref 70–99)
Potassium: 3.6 mEq/L (ref 3.5–5.1)
Sodium: 139 mEq/L (ref 135–145)

## 2011-07-10 LAB — TSH: TSH: 2.654 u[IU]/mL (ref 0.350–4.500)

## 2011-07-10 MED ORDER — CARVEDILOL 6.25 MG PO TABS: 6.2500 mg | ORAL_TABLET | Freq: Two times a day (BID) | ORAL | Status: DC

## 2011-07-10 MED ORDER — POTASSIUM CHLORIDE CRYS ER 20 MEQ PO TBCR
EXTENDED_RELEASE_TABLET | ORAL | Status: AC
  Filled 2011-07-10: qty 2

## 2011-07-10 MED ORDER — POTASSIUM CHLORIDE CRYS ER 20 MEQ PO TBCR
40.0000 meq | EXTENDED_RELEASE_TABLET | Freq: Once | ORAL | Status: AC
  Administered 2011-07-10: 40 meq via ORAL

## 2011-07-10 MED ORDER — NICOTINE 21 MG/24HR TD PT24: 1.0000 | MEDICATED_PATCH | TRANSDERMAL | Status: AC

## 2011-07-10 MED ORDER — LOSARTAN POTASSIUM 50 MG PO TABS: 50.0000 mg | ORAL_TABLET | Freq: Every day | ORAL | Status: DC

## 2011-07-10 MED ORDER — FUROSEMIDE 40 MG PO TABS: 40.0000 mg | ORAL_TABLET | Freq: Every day | ORAL | Status: DC

## 2011-07-10 NOTE — Progress Notes (Signed)
@   Subjective:  Denies CP or dyspnea; no history of syncope or palpitations   Objective:  Filed Vitals:   07/10/11 0200 07/10/11 0300 07/10/11 0500 07/10/11 0845  BP: 98/64 123/81 99/72   Pulse: 76 74 84   Temp:    97.9 F (36.6 C)  TempSrc:    Oral  Resp: 18 20    Height:      Weight:   87 kg (191 lb 12.8 oz)   SpO2: 93% 91% 99% 94%    Intake/Output from previous day:  Intake/Output Summary (Last 24 hours) at 07/10/11 0924 Last data filed at 07/10/11 0600  Gross per 24 hour  Intake    584 ml  Output   1475 ml  Net   -891 ml    Physical Exam: Physical exam: Well-developed well-nourished in no acute distress.  Skin is warm and dry.  HEENT is normal.  Neck is supple.  Chest is clear to auscultation with normal expansion.  Cardiovascular exam is regular rate and rhythm.  Abdominal exam nontender or distended. No masses palpated. Right groin with no hematoma or bruit Extremities show no edema. neuro grossly intact    Lab Results: Basic Metabolic Panel:  Basename 07/10/11 0434 07/09/11 1648 07/09/11 1300 07/09/11 0539  NA 139 -- -- 139  K 3.6 -- 4.0 --  CL 104 -- -- 101  CO2 22 -- -- 25  GLUCOSE 116* -- -- 118*  BUN 18 -- -- 16  CREATININE 1.13 1.25 -- --  CALCIUM 8.6 -- -- 8.8  MG -- -- 2.1 --  PHOS -- -- -- --   CBC:  Basename 07/09/11 1648  WBC 7.6  NEUTROABS --  HGB 19.1*  HCT 54.9*  MCV 98.7  PLT 243   Telemetry-sinus with frequent PVCs and nonsustained ventricular tachycardia.  Assessment/Plan:  1) cardiomyopathy-etiology unclear. Cardiac catheterization reveals normal coronary arteries and normal capillary wedge pressure. Check TSH. Patient has significant alcohol use which certainly could be contributing. Plan continue beta blocker and ARB. Titrate as an outpatient. Avoid EtOH. Repeat echocardiogram once medications fully titrated. His ejection fraction less than 35% he will need ICD. #2-nonsustained ventricular tachycardia-patient not  symptomatic. Continue beta blocker. Reassess LV function after meds titrated and patient abstains from alcohol. Note magnesium normal. Supplement potassium. #3-EtOH-patient counseled on discontinuing. #4-hypertension-blood pressure controlled. #5-diabetes mellitus-resume Glucophage 48 hours following procedure and followup primary care. #6-acute systolic congestive heart failure-continue ARB, beta blocker and present dose of Lasix. Patient euvolemic on examination and pulmonary capillary wedge pressure normal on catheterization. Plan check potassium and renal function 4 days following discharge. #7-tobacco abuse-patient counseled on discontinuing. Patient will followup in [place] approximately 2 weeks following discharge. Greater than 30 minutes PA and physician time D2 Olga Millers 07/10/2011, 9:24 AM

## 2011-07-10 NOTE — Discharge Summary (Signed)
Physician Discharge Summary  Patient ID: [name] [name] MRN: [id number] DOB/AGE: [birth year] 56 y.o.  Admit date: 07/07/2011 Discharge date: 07/10/2011  Primary Cardiologist: Charlton Haws, MD   Primary Discharge Diagnosis:  1 Cardiomyopathy  - Unclear etiology  - Normal coronary arteries  - Normal capillary wedge pressure  2 Acute SHF  - EF 15-20%  3 NSVT  - Asymptomatic  4 ETOH   Secondary Discharge Diagnoses: Past Medical History  Diagnosis Date  . Diabetes mellitus   . Fluid retention in legs     Reason for Admission: 56 year old [place] American male, with no prior cardiac history, who was initially admitted at [facility] with congestive heart failure, and seen in consultation by Dr. Eden Emms. A 2D echo at [facility] yielded EF 15%. Recommendation was to transfer to [facility] for right/left cardiac catheterization.   Procedures: Right Heart Cath, Left Heart Cath, Selective Coronary Angiography, LV angiography  Hospital Course:  Coronary angiography indicated normal coronary anatomy, but with severe LVD (EF 15-20%), and normal right heart pressures.  Plan is to continue beta blocker, current dose of Lasix, and ARB, and titrate as an outpatient. Patient is to avoid EtOH. Repeat echocardiogram once medications fully titrated. If ejection fraction remains less than 35%, he will need ICD.  Patient will need a TSH level and followup BMET early next week, for close monitoring of electrolytes and renal function. Metformin to be resumed 48 hours post cath.  Discharge Vitals: Blood pressure 95/59, pulse 78, temperature 98 F (36.7 C), temperature source Oral, resp. rate 20, height 6' (1.829 m), weight 188 lb 14.4 oz (85.684 kg), SpO2 98.00%.  Labs: Lab Results  Component Value Date   WBC 7.6 07/09/2011   HGB 19.1* 07/09/2011   HCT 54.9* 07/09/2011   MCV 98.7 07/09/2011   PLT 243 07/09/2011      Lab 07/10/11 0434  NA 139  K 3.6  CL 104  CO2 22  BUN 18  CREATININE 1.13  CALCIUM 8.6    ALBUMIN --  PROT --  BILITOT --  ALKPHOS --  ALT --  AST --  GLUCOSE 116*    Lab Results  Component Value Date   CHOL 169 07/09/2011   HDL 45 07/09/2011   LDLCALC 94 07/09/2011   TRIG 152* 07/09/2011    No results found for this basename: DDIMER    No results found for this basename: TSH    No results found for this basename: CKTOTAL:3,CKMB:3,TROPONINI:3 in the last 72 hours  Diagnostic Studies: Dg Chest 2 View  07/07/2011  *RADIOLOGY REPORT*  Clinical Data: Shortness of breath, smoker, cough  CHEST - 2 VIEW  Comparison: 06/28/2006  Findings: Mild enlargement of cardiac silhouette. Pulmonary vascular congestion. Small pleural effusions and bibasilar atelectasis. Peribronchial thickening. No pneumothorax. Unremarkable.  IMPRESSION: Enlargement of cardiac silhouette with pulmonary vascular congestion. Bibasilar atelectasis and small pleural effusions.  Original Report Authenticated By: Lollie Marrow, M.D.     DISPOSITION: Stable condition  FOLLOW UP PLANS AND APPOINTMENTS: Discharge Orders    Future Orders Please Complete By Expires   Diet - low sodium heart healthy      Increase activity slowly        Follow-up Information    Follow up with Charlton Haws, MD in 2 weeks. ([place] office to call and arrange follow up)    Contact information:   [address], [address] [place] [address] [phone]          DISCHARGE  MEDICATIONS: Medication List  As of 07/10/2011 12:20 PM   TAKE these medications         ALPRAZolam 1 MG tablet   Commonly known as: XANAX   Take 1 mg by mouth 3 (three) times daily as needed. For anxiety      carvedilol 6.25 MG tablet   Commonly known as: COREG   Take 1 tablet (6.25 mg total) by mouth 2 (two) times daily with a meal.      fexofenadine 180 MG tablet   Commonly known as: ALLEGRA   Take 180 mg by mouth daily.      furosemide 40 MG tablet   Commonly known as: LASIX   Take 1 tablet  (40 mg total) by mouth daily.      losartan 50 MG tablet   Commonly known as: COZAAR   Take 1 tablet (50 mg total) by mouth daily.      metFORMIN 500 MG tablet   Commonly known as: GLUCOPHAGE   Take 500 mg by mouth daily.      nicotine 21 mg/24hr patch   Commonly known as: NICODERM CQ - dosed in mg/24 hours   Place 1 patch onto the skin daily.      potassium chloride 10 MEQ tablet   Commonly known as: K-DUR,KLOR-CON   Take 10 mEq by mouth daily.            BRING ALL MEDICATIONS WITH YOU TO FOLLOW UP APPOINTMENTS  Time spent with patient to include physician time: Greater than 30 minutes, including physician time.  SignedPrescott Parma 07/10/2011, 12:20 PM Co-Sign MD

## 2011-07-10 NOTE — Progress Notes (Signed)
Pt had 12beats of VT. Pt asleep. VS obtained and remain stable. Will continue to monitor.

## 2011-07-10 NOTE — Progress Notes (Signed)
Dr. Antoine Poche made aware of 8 &12 beat runs of VT. Pt was asleep. VSS

## 2011-07-10 NOTE — Discharge Instructions (Signed)
Resume Metformin after 48 hours.  BMET and TSH level next week, arrangements to be made through our office.

## 2011-07-11 NOTE — Discharge Summary (Signed)
See progress notes [name] [name]  

## 2011-07-12 ENCOUNTER — Telehealth: Payer: Self-pay

## 2011-07-12 DIAGNOSIS — I10 Essential (primary) hypertension: Secondary | ICD-10-CM

## 2011-07-12 DIAGNOSIS — E119 Type 2 diabetes mellitus without complications: Secondary | ICD-10-CM

## 2011-07-12 LAB — GLUCOSE, CAPILLARY
Glucose-Capillary: 168 mg/dL — ABNORMAL HIGH (ref 70–99)
Glucose-Capillary: 215 mg/dL — ABNORMAL HIGH (ref 70–99)

## 2011-07-12 NOTE — Telephone Encounter (Signed)
**Note De-Identified Axel Frisk Obfuscation** Message copied by Demetrios Loll on Mon Jul 12, 2011 11:08 AM ------      Message from: San Jetty      Created: Mon Jul 12, 2011 10:08 AM       PT NEEDS TO HAVE BMET AND TSH DONE ON 07/14/11. PT IS AWARE ALREADY. CAN YOU PLEASE PUT ORDER IN. GENE SERPE CALLED IT OVER, PT HAS SEEN DR Jens Som AND KATHRYN LAWRENCE IN HOSPITAL ALSO.[email or web address]

## 2011-07-12 NOTE — Telephone Encounter (Signed)
**Note De-Identified [name] [name] Obfuscation** Lab orders faxed to [facility] lab./LV

## 2011-07-15 ENCOUNTER — Encounter: Payer: 59 | Admitting: Physician Assistant

## 2011-07-15 LAB — BASIC METABOLIC PANEL
BUN: 19 mg/dL (ref 6–23)
CO2: 24 mEq/L (ref 19–32)
Calcium: 9.3 mg/dL (ref 8.4–10.5)
Chloride: 105 mEq/L (ref 96–112)
Creat: 1.24 mg/dL (ref 0.50–1.35)
Glucose, Bld: 93 mg/dL (ref 70–99)
Potassium: 4.1 mEq/L (ref 3.5–5.3)
Sodium: 140 mEq/L (ref 135–145)

## 2011-07-15 LAB — TSH: TSH: 6.026 u[IU]/mL — ABNORMAL HIGH (ref 0.350–4.500)

## 2011-07-16 ENCOUNTER — Ambulatory Visit (INDEPENDENT_AMBULATORY_CARE_PROVIDER_SITE_OTHER): Payer: 59 | Admitting: Adult Health

## 2011-07-16 ENCOUNTER — Encounter: Payer: Self-pay | Admitting: Adult Health

## 2011-07-16 VITALS — BP 96/65 | HR 64 | Resp 18 | Ht 72.0 in | Wt 192.0 lb

## 2011-07-16 DIAGNOSIS — I509 Heart failure, unspecified: Secondary | ICD-10-CM

## 2011-07-16 DIAGNOSIS — I10 Essential (primary) hypertension: Secondary | ICD-10-CM

## 2011-07-16 NOTE — Patient Instructions (Addendum)
Your physician recommends that you schedule a follow-up appointment in: 1 month  

## 2011-07-16 NOTE — Progress Notes (Signed)
   HPI: Mr. Battie is a 56 y/o patient of Dr. Eden Emms we are seeing on follow-up after cardiac catheterization in the setting of severe systolic dysfunction, EF 15%. Cardiac cath demonstrated normal coronary arteries. He was continued on coreg, lasix and ARB. He comes today feeling better, wt is within 2 lbs of discharge wt. He is medically complaint and has stopped smoking and drinking ETOH. 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 He denies chest pain, palpitations, or dizziness. He is avoiding salt.  No Known Allergies  Current Outpatient Prescriptions  Medication Sig Dispense Refill  . ALPRAZolam (XANAX) 1 MG tablet Take 1 mg by mouth 3 (three) times daily as needed. For anxiety      . carvedilol (COREG) 6.25 MG tablet Take 1 tablet (6.25 mg total) by mouth 2 (two) times daily with a meal.  60 tablet  6  . fexofenadine (ALLEGRA) 180 MG tablet Take 180 mg by mouth daily.      . furosemide (LASIX) 40 MG tablet Take 1 tablet (40 mg total) by mouth daily.  30 tablet  6  . losartan (COZAAR) 50 MG tablet Take 1 tablet (50 mg total) by mouth daily.  30 tablet  6  . metFORMIN (GLUCOPHAGE) 500 MG tablet Take 500 mg by mouth daily.      . nicotine (NICODERM CQ - DOSED IN MG/24 HOURS) 21 mg/24hr patch Place 1 patch onto the skin daily.  28 patch  1  . potassium chloride (K-DUR,KLOR-CON) 10 MEQ tablet Take 10 mEq by mouth daily.        Past Medical History  Diagnosis Date  . Diabetes mellitus   . Fluid retention in legs     Past Surgical History  Procedure Date  . Hernia repair     BJY:NWGNFA of systems complete and found to be negative unless listed above  PHYSICAL EXAM BP 96/65  Pulse 64  Resp 18  Ht 6' (1.829 m)  Wt 192 lb (87.091 kg)  BMI 26.04 kg/m2  General: Well developed, well nourished, in no acute distress Head: Eyes PERRLA, No xanthomas.   Normal cephalic and atramatic  Lungs: Clear bilaterally to auscultation and  percussion. Heart: HRRR S1 S2,distant heart sounds..  Pulses are 2+ & equal.            No carotid bruit. No JVD.  No abdominal bruits. No femoral bruits. Abdomen: Bowel sounds are positive, abdomen soft and non-tender without masses or                  Hernia's noted. Msk:  Back normal, normal gait. Normal strength and tone for age. Extremities: No clubbing, cyanosis or edema.  DP +1. Right groin site is mildly tender with a small hard place probably related to vasoseal. No bleeding or hematoma. Neuro: Alert and oriented X 3. Psych:  Good affect, responds appropriately    ASSESSMENT AND PLAN

## 2011-07-16 NOTE — Assessment & Plan Note (Signed)
Low normal for patient with systolic dysfunction. Will continue to follow.

## 2011-07-16 NOTE — Assessment & Plan Note (Addendum)
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 He is on coreg, lasix and ARB. I am reluctant to begin hydralazine and nitrates has his BP is low normal for his EF of 15%. Will continue to monitor his progress which I have explained to him will be slow. Review of labs demonstrate Creatinine of 1.24. Potassium of 4.1. Will continue current medications. He will be seen on a monthly basis for ongoing assessment. He states he is in the GREEN zone for heart failure symptoms at this time. He will return to work in approximately 3 months with follow up echo completed prior to his return. If EF has not returned, he will be referred to Dr. Ladona Ridgel for discussion of AICD.

## 2011-07-20 ENCOUNTER — Encounter: Payer: 59 | Admitting: Adult Health

## 2011-08-16 ENCOUNTER — Ambulatory Visit (INDEPENDENT_AMBULATORY_CARE_PROVIDER_SITE_OTHER): Payer: 59 | Admitting: Adult Health

## 2011-08-16 ENCOUNTER — Encounter: Payer: Self-pay | Admitting: Adult Health

## 2011-08-16 VITALS — BP 114/70 | HR 76 | Ht 72.0 in | Wt 190.1 lb

## 2011-08-16 DIAGNOSIS — I509 Heart failure, unspecified: Secondary | ICD-10-CM

## 2011-08-16 DIAGNOSIS — I10 Essential (primary) hypertension: Secondary | ICD-10-CM

## 2011-08-16 MED ORDER — CARVEDILOL 12.5 MG PO TABS: 12.5000 mg | ORAL_TABLET | Freq: Two times a day (BID) | ORAL | Status: DC

## 2011-08-16 NOTE — Patient Instructions (Addendum)
Your physician has requested that you have an echocardiogram in September. Echocardiography is a painless test that uses sound waves to create images of your heart. It provides your doctor with information about the size and shape of your heart and how well your heart's chambers and valves are working. This procedure takes approximately one hour. There are no restrictions for this procedure.  Your physician recommends that you schedule a follow-up appointment after you have completed your ECHO    Your physician has recommended you make the following change in your medication: Increase Carvedilol to 12.5mg  1 tablet twice a day.

## 2011-08-16 NOTE — Assessment & Plan Note (Signed)
He is currently stable, and tolerating his medications without difficulty. I will increase his Coreg to 12.5 mg twice a day. I considered adding hydralazine, but blood pressure is soft. We will see how responds to Coreg dosage increase. We will followup in our office in September after repeat echocardiogram is completed. He is advised to have his blood pressure checked periodically throughout the week. If he becomes symptomatic with dizziness lightheadedness or presyncope he is to call.

## 2011-08-16 NOTE — Assessment & Plan Note (Signed)
Low normal, with EF of 15% would not want to see a much higher. As above, he will increase his Coreg to 12.5 mg twice a day.

## 2011-08-16 NOTE — Progress Notes (Signed)
   HPI: [name] [name] is a 56 y/o patient of Dr. Eden Emms we are seeing on follow-up after cardiac catheterization in the setting of severe systolic dysfunction, EF 15%. Cardiac cath demonstrated normal coronary arteries. He was continued on coreg, lasix and ARB. He comes today feeling better, wt is within 2 lbs of discharge wt. He is medically complaint and has stopped smoking and drinking ETOH.    He is here for one-month followup for continued assessment. He is feeling about the same, energy level is still low. He denies any chest pain fluid retention or dyspnea on exertion. He has not yet returned to work. He is medically compliant, and is avoiding salt.  No Known Allergies  Current Outpatient Prescriptions  Medication Sig Dispense Refill  . ALPRAZolam (XANAX) 1 MG tablet Take 1 mg by mouth 3 (three) times daily as needed. For anxiety      . carvedilol (COREG) 12.5 MG tablet Take 1 tablet (12.5 mg total) by mouth 2 (two) times daily with a meal.  60 tablet  6  . fexofenadine (ALLEGRA) 180 MG tablet Take 180 mg by mouth daily.      . furosemide (LASIX) 40 MG tablet Take 1 tablet (40 mg total) by mouth daily.  30 tablet  6  . losartan (COZAAR) 50 MG tablet Take 1 tablet (50 mg total) by mouth daily.  30 tablet  6  . metFORMIN (GLUCOPHAGE) 500 MG tablet Take 500 mg by mouth daily.      . potassium chloride (K-DUR,KLOR-CON) 10 MEQ tablet Take 10 mEq by mouth daily.      Marland Kitchen DISCONTD: carvedilol (COREG) 6.25 MG tablet Take 1 tablet (6.25 mg total) by mouth 2 (two) times daily with a meal.  60 tablet  6    Past Medical History  Diagnosis Date  . Diabetes mellitus   . Fluid retention in legs     Past Surgical History  Procedure Date  . Hernia repair     WUJ:WJXBJY of systems complete and found to be negative unless listed above  PHYSICAL EXAM BP 114/70  Pulse 76  Ht 6' (1.829 m)  Wt 190 lb 1.9 oz (86.238 kg)  BMI 25.78 kg/m2  General: Well developed, well nourished, in no acute  distress Head: Eyes PERRLA, No xanthomas.   Normal cephalic and atramatic  Lungs: Clear bilaterally to auscultation and percussion. Heart: HRRR S1 S2,distant heart sounds..  Pulses are 2+ & equal.            No carotid bruit. No JVD.  No abdominal bruits. No femoral bruits. Abdomen: Bowel sounds are positive, abdomen soft and non-tender without masses or                  Hernia's noted. Msk:  Back normal, normal gait. Normal strength and tone for age. Extremities: No clubbing, cyanosis or edema.  DP +1. Right groin site is mildly tender with a small hard place probably related to vasoseal. No bleeding or hematoma. Neuro: Alert and oriented X 3. Psych:  Good affect, responds appropriately    ASSESSMENT AND PLAN

## 2011-09-28 ENCOUNTER — Other Ambulatory Visit (HOSPITAL_COMMUNITY): Payer: 59

## 2011-09-30 ENCOUNTER — Encounter: Payer: Self-pay | Admitting: Adult Health

## 2011-09-30 ENCOUNTER — Ambulatory Visit (INDEPENDENT_AMBULATORY_CARE_PROVIDER_SITE_OTHER): Payer: 59 | Admitting: Adult Health

## 2011-09-30 VITALS — BP 96/62 | HR 69 | Ht 72.0 in | Wt 193.1 lb

## 2011-09-30 DIAGNOSIS — I10 Essential (primary) hypertension: Secondary | ICD-10-CM

## 2011-09-30 DIAGNOSIS — I509 Heart failure, unspecified: Secondary | ICD-10-CM

## 2011-09-30 DIAGNOSIS — I5022 Chronic systolic (congestive) heart failure: Secondary | ICD-10-CM

## 2011-09-30 LAB — HEMOGLOBIN A1C
Hgb A1c MFr Bld: 6.3 % — ABNORMAL HIGH (ref <5.7)
Mean Plasma Glucose: 134 mg/dL — ABNORMAL HIGH (ref <117)

## 2011-09-30 NOTE — Progress Notes (Signed)
   HPI: [name] is a 56 year old patient of Dr.Nishan we are seeing for ongoing assessment of treatment of known history of nonischemic cardiomyopathy, severe systolic dysfunction with an EF of 15%. Patient had a cardiac catheterization revealing normal coronary anatomy. He was continued on Coreg Lasix and ARB. He comes today without any new complaints, he does have some fatigue at times, he is working outside, [employer] team, and is without complaint of dizziness lightheadedness or tachycardia palpitations. He is medically compliant.  No Known Allergies  Current Outpatient Prescriptions  Medication Sig Dispense Refill  . ALPRAZolam (XANAX) 1 MG tablet Take 1 mg by mouth 3 (three) times daily as needed. For anxiety      . carvedilol (COREG) 12.5 MG tablet Take 1 tablet (12.5 mg total) by mouth 2 (two) times daily with a meal.  60 tablet  6  . fexofenadine (ALLEGRA) 180 MG tablet Take 180 mg by mouth daily.      . furosemide (LASIX) 40 MG tablet Take 1 tablet (40 mg total) by mouth daily.  30 tablet  6  . losartan (COZAAR) 50 MG tablet Take 1 tablet (50 mg total) by mouth daily.  30 tablet  6  . metFORMIN (GLUCOPHAGE) 500 MG tablet Take 500 mg by mouth daily.      . potassium chloride (K-DUR,KLOR-CON) 10 MEQ tablet Take 10 mEq by mouth daily.        Past Medical History  Diagnosis Date  . Diabetes mellitus   . Fluid retention in legs     Past Surgical History  Procedure Date  . Hernia repair     JXB:JYNWGN of systems complete and found to be negative unless listed above  PHYSICAL EXAM BP 96/62  Pulse 69  Ht 6' (1.829 m)  Wt 193 lb 1.9 oz (87.599 kg)  BMI 26.19 kg/m2  General: Well developed, well nourished, in no acute distress Head: Eyes PERRLA, No xanthomas.   Normal cephalic and atramatic  Lungs: Clear bilaterally to auscultation and percussion. Heart: HRRR S1 S2 distant heart sounds, without MRG.  Pulses are 2+ & equal.            No carotid bruit. No JVD.   No abdominal bruits. No femoral bruits. Abdomen: Bowel sounds are positive, abdomen soft and non-tender without masses or                  Hernia's noted. Msk:  Back normal, normal gait. Normal strength and tone for age. Extremities: No clubbing, cyanosis or edema.  DP +1 Neuro: Alert and oriented X 3. Psych:  Good affect, responds appropriately    ASSESSMENT AND PLAN

## 2011-09-30 NOTE — Assessment & Plan Note (Signed)
Excellent blood pressure control. No changes on this medication at this time.

## 2011-09-30 NOTE — Assessment & Plan Note (Signed)
He has no overt signs of fluid overload. He remains medically compliant. His only complaint is mild fatigue. I will repeat his echocardiogram for LV function as he has been on current medication for 3 months. We will also check chemistries, hemoglobin A1c. He will followup in one month for reevaluation and recommendations concerning medication adjustments. I discussed with him the possibility of ICD pacemaker should his LV function not have improved with medications. He verbalizes understanding and is willing to proceed with these tests. We will followup giving him more recommendations once we have results.

## 2011-09-30 NOTE — Patient Instructions (Addendum)
Your physician has requested that you have an echocardiogram. Echocardiography is a painless test that uses sound waves to create images of your heart. It provides your doctor with information about the size and shape of your heart and how well your heart's chambers and valves are working. This procedure takes approximately one hour. There are no restrictions for this procedure.   Your physician recommends that you have lab work drawn today: CMP, HbA1C We will call you with your results  Your physician recommends that you schedule a follow-up appointment in: 3 months with [name] [name].

## 2011-10-01 ENCOUNTER — Encounter: Payer: Self-pay | Admitting: *Deleted

## 2011-10-01 ENCOUNTER — Ambulatory Visit (HOSPITAL_COMMUNITY)
Admission: RE | Admit: 2011-10-01 | Discharge: 2011-10-01 | Disposition: A | Discharge status: Home / Self Care | Payer: 59 | Source: Ambulatory Visit | Attending: Adult Health | Admitting: Adult Health

## 2011-10-01 DIAGNOSIS — I517 Cardiomegaly: Secondary | ICD-10-CM

## 2011-10-01 DIAGNOSIS — I509 Heart failure, unspecified: Secondary | ICD-10-CM | POA: Insufficient documentation

## 2011-10-01 DIAGNOSIS — I10 Essential (primary) hypertension: Secondary | ICD-10-CM | POA: Insufficient documentation

## 2011-10-01 DIAGNOSIS — I5022 Chronic systolic (congestive) heart failure: Secondary | ICD-10-CM

## 2011-10-01 DIAGNOSIS — E119 Type 2 diabetes mellitus without complications: Secondary | ICD-10-CM | POA: Insufficient documentation

## 2011-10-01 LAB — COMPREHENSIVE METABOLIC PANEL
ALT: 9 U/L (ref 0–53)
AST: 13 U/L (ref 0–37)
Albumin: 4.2 g/dL (ref 3.5–5.2)
Alkaline Phosphatase: 74 U/L (ref 39–117)
BUN: 13 mg/dL (ref 6–23)
CO2: 25 mEq/L (ref 19–32)
Calcium: 8.7 mg/dL (ref 8.4–10.5)
Chloride: 108 mEq/L (ref 96–112)
Creat: 1.12 mg/dL (ref 0.50–1.35)
Glucose, Bld: 106 mg/dL — ABNORMAL HIGH (ref 70–99)
Potassium: 3.7 mEq/L (ref 3.5–5.3)
Sodium: 141 mEq/L (ref 135–145)
Total Bilirubin: 0.6 mg/dL (ref 0.3–1.2)
Total Protein: 6.7 g/dL (ref 6.0–8.3)

## 2011-10-01 NOTE — Progress Notes (Signed)
*  PRELIMINARY RESULTS* Echocardiogram 2D Echocardiogram has been performed.  [name] [name] M 10/01/2011, 10:15 AM

## 2011-12-23 ENCOUNTER — Telehealth: Payer: Self-pay

## 2011-12-23 ENCOUNTER — Other Ambulatory Visit: Payer: Self-pay

## 2011-12-23 DIAGNOSIS — Z139 Encounter for screening, unspecified: Secondary | ICD-10-CM

## 2011-12-23 NOTE — Telephone Encounter (Signed)
LM for pt to call

## 2011-12-23 NOTE — Telephone Encounter (Signed)
Gastroenterology Pre-Procedure Form     Request Date: 12/23/2011   Requesting Physician: Dr. Renard Matter     PATIENT INFORMATION:  [name] [name] is a 56 y.o., male (DOB=08-31-1955).  PROCEDURE: Procedure(s) requested: colonoscopy Procedure Reason: screening for colon cancer  PATIENT REVIEW QUESTIONS: The patient reports the following:   1. Diabetes Melitis: yes  2. Joint replacements in the past 12 months: no 3. Major health problems in the past 3 months: no 4. Has an artificial valve or MVP:no 5. Has been advised in past to take antibiotics in advance of a procedure like teeth cleaning: no}    MEDICATIONS & ALLERGIES:    Patient reports the following regarding taking any blood thinners:   Plavix? no Aspirin?yes  Coumadin?  no  Patient confirms/reports the following medications:  Current Outpatient Prescriptions  Medication Sig Dispense Refill  . ALPRAZolam (XANAX) 1 MG tablet Take 1 mg by mouth 3 (three) times daily as needed. For anxiety      . carvedilol (COREG) 12.5 MG tablet Take 1 tablet (12.5 mg total) by mouth 2 (two) times daily with a meal.  60 tablet  6  . furosemide (LASIX) 40 MG tablet Take 1 tablet (40 mg total) by mouth daily.  30 tablet  6  . metFORMIN (GLUCOPHAGE) 500 MG tablet Take 500 mg by mouth daily.      . potassium chloride (K-DUR,KLOR-CON) 10 MEQ tablet Take 10 mEq by mouth daily.      . fexofenadine (ALLEGRA) 180 MG tablet Take 180 mg by mouth daily.      Marland Kitchen losartan (COZAAR) 50 MG tablet Take 1 tablet (50 mg total) by mouth daily.  30 tablet  6    Patient confirms/reports the following allergies:  No Known Allergies  Patient is appropriate to schedule for requested procedure(s): yes  AUTHORIZATION INFORMATION Primary Insurance:   ID #:   Group #:  Pre-Cert / Auth required:  Pre-Cert / Auth #:   Secondary Insurance:   ID #:   Group #:  Pre-Cert / Auth required: Pre-Cert / Auth #:   No orders of the defined types were placed in this  encounter.    SCHEDULE INFORMATION: Procedure has been scheduled as follows:  Date: 01/04/2012    Time: 1:30 PM  Location: [facility] Short stay  This Gastroenterology Pre-Precedure Form is being routed to the following provider(s) for review: Jonette Eva, MD

## 2011-12-27 ENCOUNTER — Encounter (HOSPITAL_COMMUNITY): Payer: Self-pay | Admitting: Pharmacy Technician

## 2011-12-31 ENCOUNTER — Telehealth: Payer: Self-pay

## 2011-12-31 MED ORDER — PEG-KCL-NACL-NASULF-NA ASC-C 100 G PO SOLR: 1.0000 | ORAL | Status: DC

## 2011-12-31 NOTE — Telephone Encounter (Signed)
LMOM to call in reference to insurance. ( Per [employer], [employer] is no longer active).

## 2011-12-31 NOTE — Telephone Encounter (Signed)
MOVI PREP SPLIT DOSING, REGULAR BREAKFAST. CLEAR LIQUIDS AFTER 9 AM.  CONTINUE GLUCOPHAGE.  

## 2011-12-31 NOTE — Telephone Encounter (Signed)
Rx sent to [facility]. Faxing over the instructions. Called and went over instructions with Leonides Sake. She is aware that pt can get the instructions also at [facility]. She is aware he can have a regular breakfast before 9:00 AM on Mon and then start clear liquids.

## 2012-01-03 ENCOUNTER — Telehealth: Payer: Self-pay

## 2012-01-03 NOTE — Telephone Encounter (Signed)
I called [employer] at [phone] and spoke to Loma Sousa and she said not PA is needed for screening colonoscopy.

## 2012-01-03 NOTE — Telephone Encounter (Signed)
Pt called and gave me the insurance info.

## 2012-01-03 NOTE — Telephone Encounter (Signed)
Called and spoke to [place], pt's wife. She said he is on her insurance. She will find her card and call me back with the info.

## 2012-01-04 ENCOUNTER — Encounter (HOSPITAL_COMMUNITY)
Admission: RE | Disposition: A | Discharge status: Home / Self Care | Payer: Self-pay | Source: Ambulatory Visit | Attending: Gastroenterology

## 2012-01-04 ENCOUNTER — Ambulatory Visit (HOSPITAL_COMMUNITY)
Admission: RE | Admit: 2012-01-04 | Discharge: 2012-01-04 | Disposition: A | Discharge status: Home / Self Care | Payer: 59 | Source: Ambulatory Visit | Attending: Gastroenterology | Admitting: Gastroenterology

## 2012-01-04 ENCOUNTER — Encounter (HOSPITAL_COMMUNITY): Payer: Self-pay | Admitting: *Deleted

## 2012-01-04 DIAGNOSIS — D129 Benign neoplasm of anus and anal canal: Secondary | ICD-10-CM | POA: Insufficient documentation

## 2012-01-04 DIAGNOSIS — K648 Other hemorrhoids: Secondary | ICD-10-CM | POA: Insufficient documentation

## 2012-01-04 DIAGNOSIS — E119 Type 2 diabetes mellitus without complications: Secondary | ICD-10-CM | POA: Insufficient documentation

## 2012-01-04 DIAGNOSIS — Z139 Encounter for screening, unspecified: Secondary | ICD-10-CM

## 2012-01-04 DIAGNOSIS — Z1211 Encounter for screening for malignant neoplasm of colon: Secondary | ICD-10-CM | POA: Insufficient documentation

## 2012-01-04 DIAGNOSIS — K621 Rectal polyp: Secondary | ICD-10-CM

## 2012-01-04 DIAGNOSIS — D126 Benign neoplasm of colon, unspecified: Secondary | ICD-10-CM

## 2012-01-04 DIAGNOSIS — D128 Benign neoplasm of rectum: Secondary | ICD-10-CM | POA: Insufficient documentation

## 2012-01-04 DIAGNOSIS — K5730 Diverticulosis of large intestine without perforation or abscess without bleeding: Secondary | ICD-10-CM | POA: Insufficient documentation

## 2012-01-04 DIAGNOSIS — K620 Anal polyp: Secondary | ICD-10-CM

## 2012-01-04 HISTORY — PX: COLONOSCOPY: SHX5424

## 2012-01-04 HISTORY — DX: Heart failure, unspecified: I50.9

## 2012-01-04 LAB — GLUCOSE, CAPILLARY: Glucose-Capillary: 81 mg/dL (ref 70–99)

## 2012-01-04 SURGERY — COLONOSCOPY
Anesthesia: Moderate Sedation

## 2012-01-04 MED ORDER — MEPERIDINE HCL 100 MG/ML IJ SOLN
INTRAMUSCULAR | Status: AC
  Filled 2012-01-04: qty 2

## 2012-01-04 MED ORDER — MIDAZOLAM HCL 5 MG/5ML IJ SOLN
INTRAMUSCULAR | Status: AC
  Filled 2012-01-04: qty 10

## 2012-01-04 MED ORDER — MIDAZOLAM HCL 5 MG/5ML IJ SOLN
INTRAMUSCULAR | Status: DC | PRN
  Administered 2012-01-04 (×2): 2 mg via INTRAVENOUS

## 2012-01-04 MED ORDER — MEPERIDINE HCL 100 MG/ML IJ SOLN
INTRAMUSCULAR | Status: DC | PRN
  Administered 2012-01-04: 25 mg via INTRAVENOUS
  Administered 2012-01-04: 50 mg via INTRAVENOUS

## 2012-01-04 MED ORDER — SIMETHICONE 40 MG/0.6ML PO SUSP
ORAL | Status: DC | PRN
  Administered 2012-01-04: 13:00:00

## 2012-01-04 MED ORDER — SODIUM CHLORIDE 0.45 % IV SOLN
INTRAVENOUS | Status: DC
  Administered 2012-01-04: 1000 mL via INTRAVENOUS

## 2012-01-04 NOTE — Op Note (Signed)
[facility] [address] [place], [address]   COLONOSCOPY PROCEDURE REPORT  PATIENT: [name] [name], [name] [name]  MR#: [id number] BIRTHDATE: 1955/02/21 , 56  yrs. old GENDER: Male ENDOSCOPIST: Jonette Eva, MD REFERRED XB:JYNWG Renard Matter, M.D. PROCEDURE DATE:  01/04/2012 PROCEDURE:   Colonoscopy with cold biopsy polypectomy INDICATIONS:Average risk patient for colon cancer. MEDICATIONS: Demerol 75 mg IV and Versed 4 mg IV  DESCRIPTION OF PROCEDURE:    Physical exam was performed.  Informed consent was obtained from the patient after explaining the benefits, risks, and alternatives to procedure.  The patient was connected to monitor and placed in left lateral position. Continuous oxygen was provided by nasal cannula and IV medicine administered through an indwelling cannula.  After administration of sedation and rectal exam, the patients rectum was intubated and the EC-[id number] ([id number])  colonoscope was advanced under direct visualization to the cecum.  The scope was removed slowly by carefully examining the color, texture, anatomy, and integrity mucosa on the way out.  The patient was recovered in endoscopy and discharged home in satisfactory condition.       COLON FINDINGS: Three sessile polyps ranging between 3-48mm in size were found in the sigmoid colon and rectum.  A polypectomy was performed with cold forceps.  , Moderate diverticulosis was noted throughout the entire examined colon.  , The colon mucosa was otherwise normal.  , and Small internal hemorrhoids were found.  PREP QUALITY: good. CECAL W/D TIME: 19 minutes  COMPLICATIONS: None  ENDOSCOPIC IMPRESSION: 1.   Three sessile polyps ranging between 3-98mm in size were found in the sigmoid colon and rectum; polypectomy was performed with cold forceps 2.   Moderate diverticulosis was noted throughout the entire examined colon 3.   The colon mucosa was otherwise normal 4.   Small internal  hemorrhoids   RECOMMENDATIONS: AWAIT BIOPSY HIGH FIBER DIET TCS IN 10 YEARS       _______________________________ eSignedJonette Eva, MD 01/04/2012 3:46 PM     PATIENT NAME:  [name] [name], [name] [name] MR#: [id number]

## 2012-01-04 NOTE — H&P (Signed)
  Primary Care Physician:  Alice Reichert, MD Primary Gastroenterologist:  Dr. Darrick Penna  Pre-Procedure History & Physical: HPI:  [name] [name] is a 56 y.o. male here for COLON CANCER SCREENING.   Past Medical History  Diagnosis Date  . Diabetes mellitus   . Fluid retention in legs   . CHF (congestive heart failure)     Past Surgical History  Procedure Date  . Hernia repair     Prior to Admission medications   Medication Sig Start Date End Date Taking? Authorizing Provider  ALPRAZolam Prudy Feeler) 1 MG tablet Take 1 mg by mouth 3 (three) times daily as needed. For anxiety   Yes Historical Provider, MD  carvedilol (COREG) 12.5 MG tablet Take 1 tablet (12.5 mg total) by mouth 2 (two) times daily with a meal. 08/16/11 08/15/12 Yes Jodelle Gross, NP  furosemide (LASIX) 40 MG tablet Take 1 tablet (40 mg total) by mouth daily. 07/10/11 07/09/12 Yes Prescott Parma, PA  losartan (COZAAR) 50 MG tablet Take 1 tablet (50 mg total) by mouth daily. 07/10/11 07/09/12 Yes Prescott Parma, PA  metFORMIN (GLUCOPHAGE) 500 MG tablet Take 500 mg by mouth daily.   Yes Historical Provider, MD  fexofenadine (ALLEGRA) 180 MG tablet Take 180 mg by mouth daily.    Historical Provider, MD    Allergies as of 12/23/2011  . (No Known Allergies)    Family History  Problem Relation Age of Onset  . Heart attack Father   . Heart failure Father   . Cancer Mother     History   Social History  . Marital Status: Married    Spouse Name: N/A    Number of Children: N/A  . Years of Education: N/A   Occupational History  . Not on file.   Social History Main Topics  . Smoking status: Former Smoker -- 0.5 packs/day  . Smokeless tobacco: Not on file  . Alcohol Use: Yes     Comment: weekends  . Drug Use: No  . Sexually Active: Not on file   Other Topics Concern  . Not on file   Social History Narrative  . No narrative on file    Review of Systems: See HPI, otherwise negative ROS   Physical Exam: BP  140/86  Pulse 72  Temp 98.4 F (36.9 C) (Oral)  Resp 20  Ht 6' (1.829 m)  Wt 197 lb (89.359 kg)  BMI 26.72 kg/m2  SpO2 100% General:   Alert,  pleasant and cooperative in NAD Head:  Normocephalic and atraumatic. Neck:  Supple; Lungs:  Clear throughout to auscultation.    Heart:  Regular rate and rhythm. Abdomen:  Soft, nontender and nondistended. Normal bowel sounds, without guarding, and without rebound.   Neurologic:  Alert and  oriented x4;  grossly normal neurologically.  Impression/Plan:     SCREENING  Plan:  1. TCS TODAY

## 2012-01-07 ENCOUNTER — Encounter (HOSPITAL_COMMUNITY): Payer: Self-pay | Admitting: Gastroenterology

## 2012-01-10 ENCOUNTER — Telehealth: Payer: Self-pay | Admitting: Gastroenterology

## 2012-01-10 NOTE — Telephone Encounter (Signed)
Please call pt. He had HYPERPLASTIC POLYPS removed from her colon. TCS in 10 years. High fiber diet.

## 2012-01-11 NOTE — Telephone Encounter (Signed)
Path faxed to PCP, recall made 

## 2012-01-11 NOTE — Telephone Encounter (Signed)
Called and informed pt.  

## 2012-04-25 ENCOUNTER — Encounter: Payer: 59 | Admitting: Adult Health

## 2012-04-25 NOTE — Progress Notes (Signed)
   HPI [name] [name] is a 57 year old patient of Dr. 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 This was last seen in the office in September of 2013 continued on his current medical management. At that time we discussed the possibility of an AICD pacemaker implantation with referral to EP. A repeat echocardiogram was completed prior to this revealing a improved systolic function of 40% with diffuse hypokinesis with mild regional variation. He continues to have grade 1 diastolic dysfunction.  No Known Allergies  Current Outpatient Prescriptions  Medication Sig Dispense Refill  . ALPRAZolam (XANAX) 1 MG tablet Take 1 mg by mouth 3 (three) times daily as needed. For anxiety      . carvedilol (COREG) 12.5 MG tablet Take 1 tablet (12.5 mg total) by mouth 2 (two) times daily with a meal.  60 tablet  6  . fexofenadine (ALLEGRA) 180 MG tablet Take 180 mg by mouth daily.      . furosemide (LASIX) 40 MG tablet Take 1 tablet (40 mg total) by mouth daily.  30 tablet  6  . losartan (COZAAR) 50 MG tablet Take 1 tablet (50 mg total) by mouth daily.  30 tablet  6  . metFORMIN (GLUCOPHAGE) 500 MG tablet Take 500 mg by mouth daily.       No current facility-administered medications for this visit.    Past Medical History  Diagnosis Date  . Diabetes mellitus   . Fluid retention in legs   . CHF (congestive heart failure)     Past Surgical History  Procedure Laterality Date  . Hernia repair    . Colonoscopy  01/04/2012    Procedure: COLONOSCOPY;  Surgeon: West Bali, MD;  Location: [facility];  Service: Endoscopy;  Laterality: N/A;  1:30 PM    ROS: PHYSICAL EXAM There were no vitals taken for this visit.  EKG:  ASSESSMENT AND PLAN

## 2012-05-03 ENCOUNTER — Encounter: Payer: Self-pay | Admitting: Adult Health

## 2012-05-03 ENCOUNTER — Ambulatory Visit: Payer: 59 | Admitting: Adult Health

## 2012-05-03 NOTE — Progress Notes (Signed)
   HPI: [name] is a 57 year old patient of Dr. 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 The patient was last seen in our office in September of 2013 and was asymptomatic. Most recent echocardiogram was completed in September of 2013 demonstrating an ejection fraction of 40%, with mild to moderate LVH with moderately reduced systolic function. His Doppler parameters were consistent with grade 1 diastolic dysfunction.    He comes today with complaints of abdominal pain, occurring on and off, described as some pressure without associated burping, abdominal distention, diarrhea or constipation. He denies any dizziness or lightheadedness. He is medically compliant. He denies any rapid heart rhythm or palpitations.     No Known Allergies  Current Outpatient Prescriptions  Medication Sig Dispense Refill  . ALPRAZolam (XANAX) 1 MG tablet Take 1 mg by mouth 3 (three) times daily as needed. For anxiety      . carvedilol (COREG) 12.5 MG tablet Take 1 tablet (12.5 mg total) by mouth 2 (two) times daily with a meal.  60 tablet  6  . fexofenadine (ALLEGRA) 180 MG tablet Take 180 mg by mouth daily.      . furosemide (LASIX) 40 MG tablet Take 1 tablet (40 mg total) by mouth daily.  30 tablet  6  . losartan (COZAAR) 50 MG tablet Take 1 tablet (50 mg total) by mouth daily.  30 tablet  6  . metFORMIN (GLUCOPHAGE) 500 MG tablet Take 500 mg by mouth daily.      Marland Kitchen HYDROcodone-acetaminophen (NORCO/VICODIN) 5-325 MG per tablet       . potassium chloride (K-DUR,KLOR-CON) 10 MEQ tablet        No current facility-administered medications for this visit.    Past Medical History  Diagnosis Date  . Diabetes mellitus   . Fluid retention in legs   . CHF (congestive heart failure)     Past Surgical History  Procedure Laterality Date  . Hernia repair    .  Colonoscopy  01/04/2012    Procedure: COLONOSCOPY;  Surgeon: West Bali, MD;  Location: [facility];  Service: Endoscopy;  Laterality: N/A;  1:30 PM    ROS: PHYSICAL EXAM BP 98/60  Pulse 79  Ht 6' (1.829 m)  Wt 192 lb (87.091 kg)  BMI 26.03 kg/m2  General: Well developed, well nourished, in no acute distress Head: Eyes PERRLA, No xanthomas.   Normal cephalic and atramatic  Lungs: Clear bilaterally to auscultation and percussion. Heart: HRRR S1 S2, without MRG.  Pulses are 2+ & equal.            No carotid bruit. No JVD.  No abdominal bruits. No femoral bruits. Abdomen: Bowel sounds are positive, abdomen soft and non-tender without masses or                  Hernia's noted. Msk:  Back normal, normal gait. Normal strength and tone for age. Extremities: No clubbing, cyanosis or edema.  DP +1 Neuro: Alert and oriented X 3. Psych:  Good affect, responds appropriately  EKG: Sinus rhythm with T wave inversion noted in the anterior lateral leads, patient also has some left atrial enlargement and LVH. Rate of 70 beats per minute.  ASSESSMENT AND PLAN

## 2012-05-04 ENCOUNTER — Encounter: Payer: Self-pay | Admitting: Adult Health

## 2012-05-04 ENCOUNTER — Ambulatory Visit (INDEPENDENT_AMBULATORY_CARE_PROVIDER_SITE_OTHER): Payer: 59 | Admitting: Adult Health

## 2012-05-04 VITALS — BP 98/60 | HR 79 | Ht 72.0 in | Wt 192.0 lb

## 2012-05-04 DIAGNOSIS — I10 Essential (primary) hypertension: Secondary | ICD-10-CM

## 2012-05-04 LAB — COMPREHENSIVE METABOLIC PANEL
ALT: 14 U/L (ref 0–53)
AST: 20 U/L (ref 0–37)
Albumin: 3.6 g/dL (ref 3.5–5.2)
Alkaline Phosphatase: 67 U/L (ref 39–117)
BUN: 10 mg/dL (ref 6–23)
CO2: 24 mEq/L (ref 19–32)
Calcium: 8.6 mg/dL (ref 8.4–10.5)
Chloride: 102 mEq/L (ref 96–112)
Creat: 1.17 mg/dL (ref 0.50–1.35)
Glucose, Bld: 84 mg/dL (ref 70–99)
Potassium: 4 mEq/L (ref 3.5–5.3)
Sodium: 136 mEq/L (ref 135–145)
Total Bilirubin: 0.7 mg/dL (ref 0.3–1.2)
Total Protein: 6.3 g/dL (ref 6.0–8.3)

## 2012-05-04 MED ORDER — FUROSEMIDE 20 MG PO TABS: 20.0000 mg | ORAL_TABLET | Freq: Every day | ORAL | Status: DC

## 2012-05-04 MED ORDER — SIMETHICONE 125 MG PO CAPS: ORAL_CAPSULE | ORAL | Status: DC

## 2012-05-04 NOTE — Progress Notes (Deleted)
Name: [name] [name]    DOB: Dec 02, 1955  Age: 57 y.o.  MR#: [id number]       PCP:  Alice Reichert, MD      Insurance: Payor: [employer]  Plan: GENERIC [employer]  Product Type: *No Product type*    CC:    Chief Complaint  Patient presents with  . Congestive Heart Failure    Chronic Systolic    VS Filed Vitals:   05/04/12 1325  BP: 98/60  Pulse: 79  Height: 6' (1.829 m)  Weight: 192 lb (87.091 kg)    Weights Current Weight  05/04/12 192 lb (87.091 kg)  01/04/12 197 lb (89.359 kg)  01/04/12 197 lb (89.359 kg)    Blood Pressure  BP Readings from Last 3 Encounters:  05/04/12 98/60  01/04/12 138/93  01/04/12 138/93     Admit date:  (Not on file) Last encounter with RMR:  05/03/2012   Allergy Review of patient's allergies indicates no known allergies.  Current Outpatient Prescriptions  Medication Sig Dispense Refill  . ALPRAZolam (XANAX) 1 MG tablet Take 1 mg by mouth 3 (three) times daily as needed. For anxiety      . carvedilol (COREG) 12.5 MG tablet Take 1 tablet (12.5 mg total) by mouth 2 (two) times daily with a meal.  60 tablet  6  . fexofenadine (ALLEGRA) 180 MG tablet Take 180 mg by mouth daily.      . furosemide (LASIX) 40 MG tablet Take 1 tablet (40 mg total) by mouth daily.  30 tablet  6  . losartan (COZAAR) 50 MG tablet Take 1 tablet (50 mg total) by mouth daily.  30 tablet  6  . metFORMIN (GLUCOPHAGE) 500 MG tablet Take 500 mg by mouth daily.      Marland Kitchen HYDROcodone-acetaminophen (NORCO/VICODIN) 5-325 MG per tablet       . potassium chloride (K-DUR,KLOR-CON) 10 MEQ tablet        No current facility-administered medications for this visit.    Discontinued Meds:   There are no discontinued medications.  Patient Active Problem List  Diagnosis  . CHF (congestive heart failure)  . Diabetes mellitus  . Hypertension  . Acute systolic heart failure    LABS    Component Value Date/Time   NA 141 09/30/2011 1352   NA 140 07/12/2011 1113   NA 139 07/10/2011 0434   K 3.7  09/30/2011 1352   K 4.1 07/12/2011 1113   K 3.6 07/10/2011 0434   CL 108 09/30/2011 1352   CL 105 07/12/2011 1113   CL 104 07/10/2011 0434   CO2 25 09/30/2011 1352   CO2 24 07/12/2011 1113   CO2 22 07/10/2011 0434   GLUCOSE 106* 09/30/2011 1352   GLUCOSE 93 07/12/2011 1113   GLUCOSE 116* 07/10/2011 0434   BUN 13 09/30/2011 1352   BUN 19 07/12/2011 1113   BUN 18 07/10/2011 0434   CREATININE 1.12 09/30/2011 1352   CREATININE 1.24 07/12/2011 1113   CREATININE 1.13 07/10/2011 0434   CREATININE 1.25 07/09/2011 1648   CREATININE 1.21 07/09/2011 0539   CALCIUM 8.7 09/30/2011 1352   CALCIUM 9.3 07/12/2011 1113   CALCIUM 8.6 07/10/2011 0434   GFRNONAA 72* 07/10/2011 0434   GFRNONAA 64* 07/09/2011 1648   GFRNONAA 66* 07/09/2011 0539   GFRAA 83* 07/10/2011 0434   GFRAA 74* 07/09/2011 1648   GFRAA 77* 07/09/2011 0539   CMP     Component Value Date/Time   NA 141 09/30/2011 1352   K 3.7 09/30/2011  1352   CL 108 09/30/2011 1352   CO2 25 09/30/2011 1352   GLUCOSE 106* 09/30/2011 1352   BUN 13 09/30/2011 1352   CREATININE 1.12 09/30/2011 1352   CREATININE 1.13 07/10/2011 0434   CALCIUM 8.7 09/30/2011 1352   PROT 6.7 09/30/2011 1352   ALBUMIN 4.2 09/30/2011 1352   AST 13 09/30/2011 1352   ALT 9 09/30/2011 1352   ALKPHOS 74 09/30/2011 1352   BILITOT 0.6 09/30/2011 1352   GFRNONAA 72* 07/10/2011 0434   GFRAA 83* 07/10/2011 0434       Component Value Date/Time   WBC 7.6 07/09/2011 1648   WBC 7.3 07/07/2011 0659   WBC 10.0 06/30/2006 0525   HGB 19.1* 07/09/2011 1648   HGB 15.0 07/07/2011 0659   HGB 12.6* 11/13/2008 0143   HCT 54.9* 07/09/2011 1648   HCT 44.3 07/07/2011 0659   HCT 37.0* 11/13/2008 0143   MCV 98.7 07/09/2011 1648   MCV 98.0 07/07/2011 0659   MCV 96.1 06/30/2006 0525    Lipid Panel     Component Value Date/Time   CHOL 169 07/09/2011 0540   TRIG 152* 07/09/2011 0540   HDL 45 07/09/2011 0540   CHOLHDL 3.8 07/09/2011 0540   VLDL 30 07/09/2011 0540   LDLCALC 94 07/09/2011 0540    ABG    Component Value  Date/Time   PHART 7.452* 07/09/2011 1504   PCO2ART 32.4* 07/09/2011 1504   PO2ART 63.0* 07/09/2011 1504   HCO3 25.7* 07/09/2011 1513   TCO2 27 07/09/2011 1513   O2SAT 62.0 07/09/2011 1513     Lab Results  Component Value Date   TSH 6.026* 07/12/2011   BNP (last 3 results)  Recent Labs  07/07/11 0659  PROBNP 6821.0*   Cardiac Panel (last 3 results) No results found for this basename: CKTOTAL, CKMB, TROPONINI, RELINDX,  in the last 72 hours  Iron/TIBC/Ferritin No results found for this basename: iron, tibc, ferritin     EKG Orders placed in visit on 05/04/12  . EKG 12-LEAD     Prior Assessment and Plan Problem List as of 05/04/2012     ICD-9-CM   CHF (congestive heart failure)   Last Assessment & Plan   09/30/2011 Office Visit Written 09/30/2011  2:06 PM by Jodelle Gross, NP     He has no overt signs of fluid overload. He remains medically compliant. His only complaint is mild fatigue. I will repeat his echocardiogram for LV function as he has been on current medication for 3 months. We will also check chemistries, hemoglobin A1c. He will followup in one month for reevaluation and recommendations concerning medication adjustments. I discussed with him the possibility of ICD pacemaker should his LV function not have improved with medications. He verbalizes understanding and is willing to proceed with these tests. We will followup giving him more recommendations once we have results.    Diabetes mellitus   Hypertension   Last Assessment & Plan   09/30/2011 Office Visit Written 09/30/2011  2:06 PM by Jodelle Gross, NP     Excellent blood pressure control. No changes on this medication at this time.    Acute systolic heart failure       Imaging: No results found.

## 2012-05-04 NOTE — Addendum Note (Signed)
Addended by: Derry Lory A on: 05/04/2012 01:52 PM   Modules accepted: Orders

## 2012-05-04 NOTE — Assessment & Plan Note (Signed)
As stated above he is hypotensive during this examination. Decreasing diuretic should help with this. We will followup with him in one month unless he becomes symptomatic. Will review labs.

## 2012-05-04 NOTE — Patient Instructions (Addendum)
Your physician recommends that you schedule a follow-up appointment in: ONE MONTH  Your physician recommends that you return for lab work in: TODAY (SLIPS GIVEN-CMET,CBC,TSH)  Your physician has recommended you make the following change in your medication:   1) DECREASE LASIX TO 20MG  ONE TIME DAILY 2) GAS X ONE TO TWO TABS EVERY 4-6 HOURS AS NEEDED

## 2012-05-04 NOTE — Assessment & Plan Note (Signed)
I find no evidence for decompensated CHF. His abdomen is nondistended, it is non-tender with palpation over the right upper quadrant. I believe his abdominal pain is related to gas. I have given her prescription for Gas-X. He is found to be mildly hypotensive here in the office, and I have rechecked his blood pressure here in clinic and found it to be 108/68. We will decrease his Lasix to 20 mg daily, but he can take an extra 20 mg and he noticed some fluid retention. A followup BMET, CBC, and TSH is ordered. Was seen again in the office in one month.

## 2012-05-05 ENCOUNTER — Encounter: Payer: Self-pay | Admitting: *Deleted

## 2012-05-05 LAB — CBC
HCT: 42.5 % (ref 39.0–52.0)
Hemoglobin: 14.9 g/dL (ref 13.0–17.0)
MCH: 33.2 pg (ref 26.0–34.0)
MCHC: 35.1 g/dL (ref 30.0–36.0)
MCV: 94.7 fL (ref 78.0–100.0)
Platelets: 244 10*3/uL (ref 150–400)
RBC: 4.49 MIL/uL (ref 4.22–5.81)
RDW: 13.6 % (ref 11.5–15.5)
WBC: 6.2 10*3/uL (ref 4.0–10.5)

## 2012-05-05 LAB — TSH: TSH: 3.881 u[IU]/mL (ref 0.350–4.500)

## 2012-06-02 ENCOUNTER — Ambulatory Visit: Payer: 59 | Admitting: Adult Health

## 2012-06-05 ENCOUNTER — Encounter: Payer: 59 | Admitting: Adult Health

## 2012-06-05 NOTE — Progress Notes (Deleted)
   HPI: [name] is a 57 year old patient of Dr. Eden Emms we are seeing for ongoing assessment and management of nonischemic cardiomyopathy, systolic dysfunction with an EF of 15%, with cardiac catheterization revealing normal coronary anatomy. The patient was last seen in the office in April of 2014 with decrease of Lasix to 20 mg daily with a next to 20 mg with fluid retention. Followup labs included BMET CBC and TSH were ordered and he is here for discussion of results.  All labs were found to be within normal limits. TSH was also normal.    No Known Allergies  Current Outpatient Prescriptions  Medication Sig Dispense Refill  . ALPRAZolam (XANAX) 1 MG tablet Take 1 mg by mouth 3 (three) times daily as needed. For anxiety      . carvedilol (COREG) 12.5 MG tablet Take 1 tablet (12.5 mg total) by mouth 2 (two) times daily with a meal.  60 tablet  6  . fexofenadine (ALLEGRA) 180 MG tablet Take 180 mg by mouth daily.      . furosemide (LASIX) 20 MG tablet Take 1 tablet (20 mg total) by mouth daily.  90 tablet  3  . HYDROcodone-acetaminophen (NORCO/VICODIN) 5-325 MG per tablet       . losartan (COZAAR) 50 MG tablet Take 1 tablet (50 mg total) by mouth daily.  30 tablet  6  . metFORMIN (GLUCOPHAGE) 500 MG tablet Take 500 mg by mouth daily.      . potassium chloride (K-DUR,KLOR-CON) 10 MEQ tablet       . Simethicone (GAS-X EXTRA STRENGTH) 125 MG CAPS ONE TO TWO CAPSULE EVERY 4-6 HOURS AS NEEDED  56 each  6   No current facility-administered medications for this visit.    Past Medical History  Diagnosis Date  . Diabetes mellitus   . Fluid retention in legs   . CHF (congestive heart failure)     Past Surgical History  Procedure Laterality Date  . Hernia repair    . Colonoscopy  01/04/2012    Procedure: COLONOSCOPY;  Surgeon: West Bali, MD;  Location: [facility];  Service: Endoscopy;  Laterality: N/A;  1:30 PM    ROS: PHYSICAL EXAM There were no vitals taken for this  visit.  EKG:  ASSESSMENT AND PLAN

## 2012-07-09 ENCOUNTER — Emergency Department (HOSPITAL_COMMUNITY)
Admission: EM | Admit: 2012-07-09 | Discharge: 2012-07-09 | Disposition: A | Discharge status: Home / Self Care | Payer: 59 | Attending: Emergency Medicine | Admitting: Emergency Medicine

## 2012-07-09 ENCOUNTER — Encounter (HOSPITAL_COMMUNITY): Payer: Self-pay | Admitting: *Deleted

## 2012-07-09 ENCOUNTER — Emergency Department (HOSPITAL_COMMUNITY): Payer: 59

## 2012-07-09 DIAGNOSIS — R5381 Other malaise: Secondary | ICD-10-CM | POA: Insufficient documentation

## 2012-07-09 DIAGNOSIS — Z87891 Personal history of nicotine dependence: Secondary | ICD-10-CM | POA: Insufficient documentation

## 2012-07-09 DIAGNOSIS — Z9861 Coronary angioplasty status: Secondary | ICD-10-CM | POA: Insufficient documentation

## 2012-07-09 DIAGNOSIS — R5383 Other fatigue: Secondary | ICD-10-CM | POA: Insufficient documentation

## 2012-07-09 DIAGNOSIS — R05 Cough: Secondary | ICD-10-CM | POA: Insufficient documentation

## 2012-07-09 DIAGNOSIS — E119 Type 2 diabetes mellitus without complications: Secondary | ICD-10-CM | POA: Insufficient documentation

## 2012-07-09 DIAGNOSIS — Z79899 Other long term (current) drug therapy: Secondary | ICD-10-CM | POA: Insufficient documentation

## 2012-07-09 DIAGNOSIS — R6883 Chills (without fever): Secondary | ICD-10-CM | POA: Insufficient documentation

## 2012-07-09 DIAGNOSIS — Z8739 Personal history of other diseases of the musculoskeletal system and connective tissue: Secondary | ICD-10-CM | POA: Insufficient documentation

## 2012-07-09 DIAGNOSIS — R110 Nausea: Secondary | ICD-10-CM | POA: Insufficient documentation

## 2012-07-09 DIAGNOSIS — I509 Heart failure, unspecified: Secondary | ICD-10-CM

## 2012-07-09 DIAGNOSIS — R059 Cough, unspecified: Secondary | ICD-10-CM | POA: Insufficient documentation

## 2012-07-09 LAB — BASIC METABOLIC PANEL
BUN: 11 mg/dL (ref 6–23)
CO2: 26 mEq/L (ref 19–32)
Calcium: 8.4 mg/dL (ref 8.4–10.5)
Chloride: 97 mEq/L (ref 96–112)
Creatinine, Ser: 1 mg/dL (ref 0.50–1.35)
GFR calc Af Amer: >90 mL/min (ref >90)
GFR calc non Af Amer: 83 mL/min — ABNORMAL LOW (ref >90)
Glucose, Bld: 153 mg/dL — ABNORMAL HIGH (ref 70–99)
Potassium: 3.2 mEq/L — ABNORMAL LOW (ref 3.5–5.1)
Sodium: 136 mEq/L (ref 135–145)

## 2012-07-09 LAB — CBC WITH DIFFERENTIAL/PLATELET
Basophils Absolute: 0.1 10*3/uL (ref 0.0–0.1)
Basophils Relative: 1 % (ref 0–1)
Eosinophils Absolute: 0.1 10*3/uL (ref 0.0–0.7)
Eosinophils Relative: 1 % (ref 0–5)
HCT: 40.1 % (ref 39.0–52.0)
Hemoglobin: 13.6 g/dL (ref 13.0–17.0)
Lymphocytes Relative: 22 % (ref 12–46)
Lymphs Abs: 1.7 10*3/uL (ref 0.7–4.0)
MCH: 32.6 pg (ref 26.0–34.0)
MCHC: 33.9 g/dL (ref 30.0–36.0)
MCV: 96.2 fL (ref 78.0–100.0)
Monocytes Absolute: 0.8 10*3/uL (ref 0.1–1.0)
Monocytes Relative: 10 % (ref 3–12)
Neutro Abs: 4.9 10*3/uL (ref 1.7–7.7)
Neutrophils Relative %: 66 % (ref 43–77)
Platelets: 185 10*3/uL (ref 150–400)
RBC: 4.17 MIL/uL — ABNORMAL LOW (ref 4.22–5.81)
RDW: 12.9 % (ref 11.5–15.5)
WBC: 7.6 10*3/uL (ref 4.0–10.5)

## 2012-07-09 LAB — TROPONIN I: Troponin I: <0.3 ng/mL (ref <0.30)

## 2012-07-09 LAB — PRO B NATRIURETIC PEPTIDE: Pro B Natriuretic peptide (BNP): 6063 pg/mL — ABNORMAL HIGH (ref 0–125)

## 2012-07-09 MED ORDER — FUROSEMIDE 20 MG PO TABS: 20.0000 mg | ORAL_TABLET | Freq: Two times a day (BID) | ORAL | Status: DC

## 2012-07-09 MED ORDER — LORAZEPAM 1 MG PO TABS
1.0000 mg | ORAL_TABLET | Freq: Once | ORAL | Status: AC
  Administered 2012-07-09: 1 mg via ORAL
  Filled 2012-07-09: qty 1

## 2012-07-09 MED ORDER — FUROSEMIDE 40 MG PO TABS
60.0000 mg | ORAL_TABLET | Freq: Once | ORAL | Status: AC
  Administered 2012-07-09: 60 mg via ORAL
  Filled 2012-07-09: qty 2

## 2012-07-09 NOTE — ED Notes (Signed)
Pt c/o sob, generalized fatigue, trouble sleeping that started a week ago, sob is worse with exertion,  has a cough that is productive with dark sputum, denies any fever, or pain.

## 2012-07-09 NOTE — ED Provider Notes (Signed)
History    This chart was scribed for Lyanne Co, MD by Leone Payor, ED Scribe. This patient was seen in room [facility] and the patient's care was started 10:13 AM.   CSN: [id number]  Arrival date & time 07/09/12  4098   First MD Initiated Contact with Patient 07/09/12 1007      Chief Complaint  Patient presents with  . Shortness of Breath     The history is provided by the patient. No language interpreter was used.    HPI Comments: [name] [name] is a 57 y.o. male who presents to the Emergency Department complaining of ongoing, constant SOB that started 1 week ago. States he has associated cough productive of dark sputum as well as generalized fatigue for the last several days. The SOB is worse with exertion which has increased recently. He also has chills, nausea, new swelling to lower extremities. Pt was diagnosed with CHF last year and takes carvedilol. He had a heart catheterization performed and was prescribed lasix. He denies fever. Pt has h/o DM, fluid retention in legs, CHF.   Past Medical History  Diagnosis Date  . Diabetes mellitus   . Fluid retention in legs   . CHF (congestive heart failure)     Past Surgical History  Procedure Laterality Date  . Hernia repair    . Colonoscopy  01/04/2012    Procedure: COLONOSCOPY;  Surgeon: West Bali, MD;  Location: [facility];  Service: Endoscopy;  Laterality: N/A;  1:30 PM    Family History  Problem Relation Age of Onset  . Heart attack Father   . Heart failure Father   . Cancer Mother     History  Substance Use Topics  . Smoking status: Former Smoker -- 0.50 packs/day  . Smokeless tobacco: Not on file  . Alcohol Use: Yes     Comment: weekends      Review of Systems A complete 10 system review of systems was obtained and all systems are negative except as noted in the HPI and PMH.   Allergies  Review of patient's allergies indicates no known allergies.  Home Medications   Current Outpatient  Rx  Name  Route  Sig  Dispense  Refill  . furosemide (LASIX) 20 MG tablet   Oral   Take 1 tablet (20 mg total) by mouth daily.   90 tablet   3   . HYDROcodone-acetaminophen (NORCO/VICODIN) 5-325 MG per tablet   Oral   Take 1 tablet by mouth 4 (four) times daily as needed for pain.          Marland Kitchen losartan (COZAAR) 50 MG tablet   Oral   Take 1 tablet (50 mg total) by mouth daily.   30 tablet   6   . potassium chloride SA (K-DUR,KLOR-CON) 20 MEQ tablet   Oral   Take 20 mEq by mouth daily.         Marland Kitchen ALPRAZolam (XANAX) 1 MG tablet   Oral   Take 1 mg by mouth 3 (three) times daily as needed. For anxiety         . carvedilol (COREG) 12.5 MG tablet   Oral   Take 1 tablet (12.5 mg total) by mouth 2 (two) times daily with a meal.   60 tablet   6   . fexofenadine (ALLEGRA) 180 MG tablet   Oral   Take 180 mg by mouth daily as needed (allergies).          Marland Kitchen  metFORMIN (GLUCOPHAGE) 500 MG tablet   Oral   Take 500 mg by mouth daily.           BP 117/70  Pulse 86  Temp(Src) 98.9 F (37.2 C) (Oral)  Resp 24  Ht 6' (1.829 m)  Wt 180 lb (81.647 kg)  BMI 24.41 kg/m2  SpO2 96%  Physical Exam  Nursing note and vitals reviewed. Constitutional: He is oriented to person, place, and time. He appears well-developed and well-nourished.  HENT:  Head: Normocephalic and atraumatic.  Eyes: EOM are normal.  Neck: Normal range of motion.  Cardiovascular: Normal rate, regular rhythm, normal heart sounds and intact distal pulses.   Pulmonary/Chest: Effort normal. No respiratory distress. He has no wheezes. He has rales.  Rhonchi and rales diffusely, no wheezes.   Abdominal: Soft. He exhibits no distension. There is no tenderness.  Genitourinary: Rectum normal.  Musculoskeletal: Normal range of motion.  No significant lower extremity swelling  Neurological: He is alert and oriented to person, place, and time.  Skin: Skin is warm and dry.  Psychiatric: He has a normal mood and  affect. Judgment normal.    ED Course  Procedures (including critical care time)   Date: 07/09/2012  Rate: 94  Rhythm: normal sinus rhythm  QRS Axis: normal  Intervals: normal  ST/T Wave abnormalities: normal  Conduction Disutrbances: none  Narrative Interpretation:   Old EKG Reviewed: No significant changes noted  DIAGNOSTIC STUDIES: Oxygen Saturation is 94% on RA, adequate by my interpretation.    COORDINATION OF CARE: 10:13 AM Discussed treatment plan with pt at bedside and pt agreed to plan.   12:38 PM Pt updated on lab and imaging results. Advised to follow up with NP at [facility].   Labs Reviewed  CBC WITH DIFFERENTIAL - Abnormal; Notable for the following:    RBC 4.17 (*)    All other components within normal limits  BASIC METABOLIC PANEL - Abnormal; Notable for the following:    Potassium 3.2 (*)    Glucose, Bld 153 (*)    GFR calc non Af Amer 83 (*)    All other components within normal limits  PRO B NATRIURETIC PEPTIDE - Abnormal; Notable for the following:    Pro B Natriuretic peptide (BNP) 6063.0 (*)    All other components within normal limits  TROPONIN I   Dg Chest 2 View  07/09/2012   *RADIOLOGY REPORT*  Clinical Data: Shortness of breath  CHEST - 2 VIEW  Comparison: 07/07/2011  Findings: Heart size appears mildly enlarged.  There are small bilateral pleural effusions identified.  No interstitial edema. Bibasilar atelectasis is identified.  IMPRESSION:  1.  Cardiac enlargement and small bilateral pleural effusions persist. 2.  Bibasilar atelectasis.   Original Report Authenticated By: Signa Kell, M.D.    I personally reviewed the imaging tests through PACS system I reviewed available ER/hospitalization records through the EMR   1. CHF exacerbation       MDM  12:47 PM Pt feels better at this time after additional lasix in the ER. Will double his lasix x 7 days. I made a personal note to his primary cardiology NP for close follow up. He understands to  return to ER for new or worsening symptoms. Last EF was 15-20%.     I personally performed the services described in this documentation, which was scribed in my presence. The recorded information has been reviewed and is accurate.      Lyanne Co, MD 07/09/12 [phone]

## 2012-07-10 ENCOUNTER — Other Ambulatory Visit: Payer: Self-pay | Admitting: Physician Assistant

## 2012-07-11 ENCOUNTER — Telehealth: Payer: Self-pay | Admitting: *Deleted

## 2012-07-11 ENCOUNTER — Encounter: Payer: Self-pay | Admitting: *Deleted

## 2012-07-11 NOTE — Telephone Encounter (Signed)
PT WAS TAKEN OUT OF WORK WHEN HE WAS SEEN IN THE HOSPITAL TILL 07/09/12. PATIENT STATES THEY ED DOCTOR WANTED HIM SEEN BEFORE HE WENT BACK TO WORK. WE CAN NOT GET HIM IN FOR APPT TILL 07/17/12 WITH K LAWRENCE.  PATIENT IS WANTING [place] TO GIVE HIM A LETTER TAKING HIM OUT OF WORK TILL 07/17/12.

## 2012-07-12 ENCOUNTER — Telehealth: Payer: Self-pay

## 2012-07-12 ENCOUNTER — Encounter: Payer: Self-pay | Admitting: *Deleted

## 2012-07-12 NOTE — Telephone Encounter (Signed)
Should have been written by ER MD since he requested that he stay out of work. However, we will provide the letter.

## 2012-07-12 NOTE — Telephone Encounter (Signed)
Letter printed. Placed at front desk for pick up. Pt aware.

## 2012-07-12 NOTE — Telephone Encounter (Signed)
Patient called regarding letter for work.  States that job is giving him a hard time.  Needs letter to state out of work until can be seen.  Please call patient.

## 2012-07-17 ENCOUNTER — Ambulatory Visit (INDEPENDENT_AMBULATORY_CARE_PROVIDER_SITE_OTHER): Payer: 59 | Admitting: Adult Health

## 2012-07-17 ENCOUNTER — Encounter: Payer: Self-pay | Admitting: *Deleted

## 2012-07-17 ENCOUNTER — Other Ambulatory Visit: Payer: Self-pay | Admitting: *Deleted

## 2012-07-17 ENCOUNTER — Encounter: Payer: Self-pay | Admitting: Adult Health

## 2012-07-17 VITALS — BP 136/88 | HR 92 | Ht 72.0 in | Wt 196.2 lb

## 2012-07-17 DIAGNOSIS — I5032 Chronic diastolic (congestive) heart failure: Secondary | ICD-10-CM

## 2012-07-17 DIAGNOSIS — I10 Essential (primary) hypertension: Secondary | ICD-10-CM

## 2012-07-17 LAB — COMPREHENSIVE METABOLIC PANEL
ALT: 9 U/L (ref 0–53)
AST: 12 U/L (ref 0–37)
Albumin: 3.9 g/dL (ref 3.5–5.2)
Alkaline Phosphatase: 93 U/L (ref 39–117)
BUN: 14 mg/dL (ref 6–23)
CO2: 24 mEq/L (ref 19–32)
Calcium: 8.7 mg/dL (ref 8.4–10.5)
Chloride: 103 mEq/L (ref 96–112)
Creat: 1 mg/dL (ref 0.50–1.35)
Glucose, Bld: 94 mg/dL (ref 70–99)
Potassium: 4.2 mEq/L (ref 3.5–5.3)
Sodium: 139 mEq/L (ref 135–145)
Total Bilirubin: 0.7 mg/dL (ref 0.3–1.2)
Total Protein: 7.1 g/dL (ref 6.0–8.3)

## 2012-07-17 MED ORDER — POTASSIUM CHLORIDE CRYS ER 20 MEQ PO TBCR: 20.0000 meq | EXTENDED_RELEASE_TABLET | Freq: Every day | ORAL | Status: DC

## 2012-07-17 MED ORDER — CARVEDILOL 12.5 MG PO TABS: 12.5000 mg | ORAL_TABLET | Freq: Two times a day (BID) | ORAL | Status: DC

## 2012-07-17 MED ORDER — SPIRONOLACTONE 25 MG PO TABS: 25.0000 mg | ORAL_TABLET | Freq: Every day | ORAL | Status: DC

## 2012-07-17 NOTE — Patient Instructions (Addendum)
Your physician recommends that you schedule a follow-up appointment in: 1 week to 10 days  Your physician recommends that you return for lab work in today.  Your physician has requested that you have an echocardiogram. Echocardiography is a painless test that uses sound waves to create images of your heart. It provides your doctor with information about the size and shape of your heart and how well your heart's chambers and valves are working. This procedure takes approximately one hour. There are no restrictions for this procedure.   Your physician has recommended you make the following change in your medication: Start Spironolactone 25 mg daily.

## 2012-07-17 NOTE — Progress Notes (Signed)
HPI: [name] is a 57 year old patient of Dr. Eden Emms we are following for ongoing assessment and management of nonischemic cardiomyopathy, severe systolic dysfunction with an EF of 15%, with cardiac catheterization revealing normal coronary anatomy. He was last seen in our office on 05/04/2012. He was seen in the emergency room on 07/11/2012 for increased shortness of breath and mild lower extremity edema. Lasix was increased to 40 mg oncea day for 7 days. A letter to keep him out of work until being seen in our office was written for him.    Since ER visit, he is having symptoms of PND, but no over edema. He has no further episodes of DOE. He has no complaints of chest pain. He admits to dietary noncompliance with fast food and snacks from vending machines at work.    No Known Allergies  Current Outpatient Prescriptions  Medication Sig Dispense Refill  . ALPRAZolam (XANAX) 1 MG tablet Take 1 mg by mouth 3 (three) times daily as needed. For anxiety      . carvedilol (COREG) 12.5 MG tablet Take 1 tablet (12.5 mg total) by mouth 2 (two) times daily with a meal.  60 tablet  6  . fexofenadine (ALLEGRA) 180 MG tablet Take 180 mg by mouth daily as needed (allergies).       . furosemide (LASIX) 20 MG tablet Take 1 tablet (20 mg total) by mouth daily.  90 tablet  3  . furosemide (LASIX) 20 MG tablet Take 1 tablet (20 mg total) by mouth 2 (two) times daily.  14 tablet  0  . HYDROcodone-acetaminophen (NORCO/VICODIN) 5-325 MG per tablet Take 1 tablet by mouth 4 (four) times daily as needed for pain.       Marland Kitchen losartan (COZAAR) 50 MG tablet Take 1 tablet (50 mg total) by mouth daily.  30 tablet  6  . metFORMIN (GLUCOPHAGE) 500 MG tablet Take 500 mg by mouth daily.      . potassium chloride SA (K-DUR,KLOR-CON) 20 MEQ tablet Take 20 mEq by mouth daily.      . simvastatin (ZOCOR) 40 MG tablet        No current facility-administered medications for this visit.    Past Medical History  Diagnosis Date  .  Diabetes mellitus   . Fluid retention in legs   . CHF (congestive heart failure)     Past Surgical History  Procedure Laterality Date  . Hernia repair    . Colonoscopy  01/04/2012    Procedure: COLONOSCOPY;  Surgeon: West Bali, MD;  Location: [facility];  Service: Endoscopy;  Laterality: N/A;  1:30 PM    ZOX:WRUEAV of systems complete and found to be negative unless listed above  PHYSICAL EXAM BP 136/88  Pulse 92  Ht 6' (1.829 m)  Wt 196 lb 4 oz (89.018 kg)  BMI 26.61 kg/m2  General: Well developed, well nourished, in no acute distress Head: Eyes PERRLA, No xanthomas.   Normal cephalic and atramatic  Lungs: Clear bilaterally to auscultation and percussion. Heart: HRRR S1 S2, tachycardic, without MRG.  Pulses are 2+ & equal.            No carotid bruit. No JVD.  No abdominal bruits. No femoral bruits. Abdomen: Bowel sounds are positive, abdomen soft and non-tender without masses or                  Hernia's noted. Msk:  Back normal, normal gait. Normal strength and tone for age. Extremities: No  clubbing, cyanosis or edema.  DP +1 Neuro: Alert and oriented X 3. Psych:  Good affect, responds appropriately    ASSESSMENT AND PLAN

## 2012-07-17 NOTE — Progress Notes (Deleted)
Name: [name] [name]    DOB: 1955-12-02  Age: 57 y.o.  MR#: [id number]       PCP:  Alice Reichert, MD      Insurance: Payor: [employer] / Plan: Research scientist (life sciences) / Product Type: *No Product type* /   CC:    Chief Complaint  Patient presents with  . Hypertension  pt needs refill on coreg if dosage stays the same today  VS Filed Vitals:   07/17/12 1131  BP: 136/88  Pulse: 92  Height: 6' (1.829 m)  Weight: 196 lb 4 oz (89.018 kg)    Weights Current Weight  07/17/12 196 lb 4 oz (89.018 kg)  07/09/12 180 lb (81.647 kg)  05/04/12 192 lb (87.091 kg)    Blood Pressure  BP Readings from Last 3 Encounters:  07/17/12 136/88  07/09/12 125/85  05/04/12 98/60     Admit date:  (Not on file) Last encounter with RMR:  06/05/2012   Allergy Review of patient's allergies indicates no known allergies.  Current Outpatient Prescriptions  Medication Sig Dispense Refill  . ALPRAZolam (XANAX) 1 MG tablet Take 1 mg by mouth 3 (three) times daily as needed. For anxiety      . carvedilol (COREG) 12.5 MG tablet Take 1 tablet (12.5 mg total) by mouth 2 (two) times daily with a meal.  60 tablet  6  . fexofenadine (ALLEGRA) 180 MG tablet Take 180 mg by mouth daily as needed (allergies).       . furosemide (LASIX) 20 MG tablet Take 1 tablet (20 mg total) by mouth daily.  90 tablet  3  . furosemide (LASIX) 20 MG tablet Take 1 tablet (20 mg total) by mouth 2 (two) times daily.  14 tablet  0  . HYDROcodone-acetaminophen (NORCO/VICODIN) 5-325 MG per tablet Take 1 tablet by mouth 4 (four) times daily as needed for pain.       Marland Kitchen losartan (COZAAR) 50 MG tablet Take 1 tablet (50 mg total) by mouth daily.  30 tablet  6  . metFORMIN (GLUCOPHAGE) 500 MG tablet Take 500 mg by mouth daily.      . potassium chloride SA (K-DUR,KLOR-CON) 20 MEQ tablet Take 20 mEq by mouth daily.      . simvastatin (ZOCOR) 40 MG tablet        No current facility-administered medications for this visit.    Discontinued Meds:   There are  no discontinued medications.  Patient Active Problem List   Diagnosis Date Noted  . Acute systolic heart failure 07/10/2011  . CHF (congestive heart failure) 07/08/2011  . Diabetes mellitus 07/08/2011  . Hypertension 07/08/2011    LABS    Component Value Date/Time   NA 136 07/09/2012 1015   NA 136 05/04/2012 1352   NA 141 09/30/2011 1352   K 3.2* 07/09/2012 1015   K 4.0 05/04/2012 1352   K 3.7 09/30/2011 1352   CL 97 07/09/2012 1015   CL 102 05/04/2012 1352   CL 108 09/30/2011 1352   CO2 26 07/09/2012 1015   CO2 24 05/04/2012 1352   CO2 25 09/30/2011 1352   GLUCOSE 153* 07/09/2012 1015   GLUCOSE 84 05/04/2012 1352   GLUCOSE 106* 09/30/2011 1352   BUN 11 07/09/2012 1015   BUN 10 05/04/2012 1352   BUN 13 09/30/2011 1352   CREATININE 1.00 07/09/2012 1015   CREATININE 1.17 05/04/2012 1352   CREATININE 1.12 09/30/2011 1352   CREATININE 1.24 07/12/2011 1113   CREATININE 1.13 07/10/2011 0434  CREATININE 1.25 07/09/2011 1648   CALCIUM 8.4 07/09/2012 1015   CALCIUM 8.6 05/04/2012 1352   CALCIUM 8.7 09/30/2011 1352   GFRNONAA 83* 07/09/2012 1015   GFRNONAA 72* 07/10/2011 0434   GFRNONAA 64* 07/09/2011 1648   GFRAA >90 07/09/2012 1015   GFRAA 83* 07/10/2011 0434   GFRAA 74* 07/09/2011 1648   CMP     Component Value Date/Time   NA 136 07/09/2012 1015   K 3.2* 07/09/2012 1015   CL 97 07/09/2012 1015   CO2 26 07/09/2012 1015   GLUCOSE 153* 07/09/2012 1015   BUN 11 07/09/2012 1015   CREATININE 1.00 07/09/2012 1015   CREATININE 1.17 05/04/2012 1352   CALCIUM 8.4 07/09/2012 1015   PROT 6.3 05/04/2012 1352   ALBUMIN 3.6 05/04/2012 1352   AST 20 05/04/2012 1352   ALT 14 05/04/2012 1352   ALKPHOS 67 05/04/2012 1352   BILITOT 0.7 05/04/2012 1352   GFRNONAA 83* 07/09/2012 1015   GFRAA >90 07/09/2012 1015       Component Value Date/Time   WBC 7.6 07/09/2012 1015   WBC 6.2 05/04/2012 1352   WBC 7.6 07/09/2011 1648   HGB 13.6 07/09/2012 1015   HGB 14.9 05/04/2012 1352   HGB 19.1* 07/09/2011 1648   HCT 40.1 07/09/2012  1015   HCT 42.5 05/04/2012 1352   HCT 54.9* 07/09/2011 1648   MCV 96.2 07/09/2012 1015   MCV 94.7 05/04/2012 1352   MCV 98.7 07/09/2011 1648    Lipid Panel     Component Value Date/Time   CHOL 169 07/09/2011 0540   TRIG 152* 07/09/2011 0540   HDL 45 07/09/2011 0540   CHOLHDL 3.8 07/09/2011 0540   VLDL 30 07/09/2011 0540   LDLCALC 94 07/09/2011 0540    ABG    Component Value Date/Time   PHART 7.452* 07/09/2011 1504   PCO2ART 32.4* 07/09/2011 1504   PO2ART 63.0* 07/09/2011 1504   HCO3 25.7* 07/09/2011 1513   TCO2 27 07/09/2011 1513   O2SAT 62.0 07/09/2011 1513     Lab Results  Component Value Date   TSH 3.881 05/04/2012   BNP (last 3 results)  Recent Labs  07/09/12 1015  PROBNP 6063.0*   Cardiac Panel (last 3 results) No results found for this basename: CKTOTAL, CKMB, TROPONINI, RELINDX,  in the last 72 hours  Iron/TIBC/Ferritin No results found for this basename: iron, tibc, ferritin     EKG Orders placed in visit on 07/17/12  . EKG 12-LEAD     Prior Assessment and Plan Problem List as of 07/17/2012   CHF (congestive heart failure)   Last Assessment & Plan   05/04/2012 Office Visit Written 05/04/2012  1:48 PM by Jodelle Gross, NP     I find no evidence for decompensated CHF. His abdomen is nondistended, it is non-tender with palpation over the right upper quadrant. I believe his abdominal pain is related to gas. I have given her prescription for Gas-X. He is found to be mildly hypotensive here in the office, and I have rechecked his blood pressure here in clinic and found it to be 108/68. We will decrease his Lasix to 20 mg daily, but he can take an extra 20 mg and he noticed some fluid retention. A followup BMET, CBC, and TSH is ordered. Was seen again in the office in one month.    Diabetes mellitus   Hypertension   Last Assessment & Plan   05/04/2012 Office Visit Written 05/04/2012  1:48 PM by Bettey Mare  Lawrence, NP     As stated above he is hypotensive during this  examination. Decreasing diuretic should help with this. We will followup with him in one month unless he becomes symptomatic. Will review labs.    Acute systolic heart failure       Imaging: Dg Chest 2 View  07/09/2012   *RADIOLOGY REPORT*  Clinical Data: Shortness of breath  CHEST - 2 VIEW  Comparison: 07/07/2011  Findings: Heart size appears mildly enlarged.  There are small bilateral pleural effusions identified.  No interstitial edema. Bibasilar atelectasis is identified.  IMPRESSION:  1.  Cardiac enlargement and small bilateral pleural effusions persist. 2.  Bibasilar atelectasis.   Original Report Authenticated By: Signa Kell, M.D.

## 2012-07-17 NOTE — Assessment & Plan Note (Signed)
BP is well controlled currently. However, LV fx may be decreased. Will await changing medications until his EF is evaluated Letter to keep him out of work for 2 weeks is provided. We will see him in one week.

## 2012-07-17 NOTE — Assessment & Plan Note (Signed)
No overt signs of CHF on examination. No edema, JVD, or decreased breath sounds. He has somatic complaints of PND and DOE. Last Pro-BNP was >6000. He has been taking lasix 40 mg daily as directed. It was noted that he was hypokalemic with K+ 3.2. He was given potassium replacement with increased dose of lasix.   I will begin spironolactone 25 mg daily. Recheck BMET and Pro-BNP. He will have a repeat echo for LV fx. He is advised to avoid salt in his diet, fast foods or vending machine food.

## 2012-07-18 ENCOUNTER — Ambulatory Visit (HOSPITAL_COMMUNITY)
Admission: RE | Admit: 2012-07-18 | Discharge: 2012-07-18 | Disposition: A | Discharge status: Home / Self Care | Payer: 59 | Source: Ambulatory Visit | Attending: Adult Health | Admitting: Adult Health

## 2012-07-18 DIAGNOSIS — I5032 Chronic diastolic (congestive) heart failure: Secondary | ICD-10-CM

## 2012-07-18 DIAGNOSIS — I5021 Acute systolic (congestive) heart failure: Secondary | ICD-10-CM | POA: Insufficient documentation

## 2012-07-18 DIAGNOSIS — Z6826 Body mass index (BMI) 26.0-26.9, adult: Secondary | ICD-10-CM | POA: Insufficient documentation

## 2012-07-18 DIAGNOSIS — E119 Type 2 diabetes mellitus without complications: Secondary | ICD-10-CM | POA: Insufficient documentation

## 2012-07-18 DIAGNOSIS — I10 Essential (primary) hypertension: Secondary | ICD-10-CM

## 2012-07-18 DIAGNOSIS — I369 Nonrheumatic tricuspid valve disorder, unspecified: Secondary | ICD-10-CM

## 2012-07-18 LAB — PRO B NATRIURETIC PEPTIDE: Pro B Natriuretic peptide (BNP): 4864 pg/mL — ABNORMAL HIGH (ref <126)

## 2012-07-18 NOTE — Progress Notes (Signed)
*  PRELIMINARY RESULTS* Echocardiogram 2D Echocardiogram has been performed.  [name] [name] 07/18/2012, 2:36 PM 

## 2012-07-18 NOTE — Addendum Note (Signed)
Addended by: Thompson Grayer on: 07/18/2012 04:13 PM   Modules accepted: Orders

## 2012-07-20 ENCOUNTER — Telehealth: Payer: Self-pay

## 2012-07-20 MED ORDER — CARVEDILOL 25 MG PO TABS: 25.0000 mg | ORAL_TABLET | Freq: Two times a day (BID) | ORAL | Status: DC

## 2012-07-20 NOTE — Telephone Encounter (Signed)
Called pt back. Pt not there. Left a message to call back.

## 2012-07-20 NOTE — Telephone Encounter (Signed)
Patient asking about echo results.  Patient also asked about FMLA paperwork.  Please call patient.

## 2012-07-22 LAB — BASIC METABOLIC PANEL
BUN: 14 mg/dL (ref 6–23)
CO2: 25 mEq/L (ref 19–32)
Calcium: 8.9 mg/dL (ref 8.4–10.5)
Chloride: 103 mEq/L (ref 96–112)
Creat: 1.17 mg/dL (ref 0.50–1.35)
Glucose, Bld: 121 mg/dL — ABNORMAL HIGH (ref 70–99)
Potassium: 4.6 mEq/L (ref 3.5–5.3)
Sodium: 137 mEq/L (ref 135–145)

## 2012-07-24 ENCOUNTER — Encounter: Payer: Self-pay | Admitting: *Deleted

## 2012-07-24 ENCOUNTER — Ambulatory Visit (INDEPENDENT_AMBULATORY_CARE_PROVIDER_SITE_OTHER): Payer: 59 | Admitting: Adult Health

## 2012-07-24 ENCOUNTER — Encounter: Payer: Self-pay | Admitting: Adult Health

## 2012-07-24 VITALS — BP 119/83 | HR 76 | Ht 72.0 in | Wt 192.0 lb

## 2012-07-24 DIAGNOSIS — I10 Essential (primary) hypertension: Secondary | ICD-10-CM

## 2012-07-24 DIAGNOSIS — I5032 Chronic diastolic (congestive) heart failure: Secondary | ICD-10-CM

## 2012-07-24 MED ORDER — LOSARTAN POTASSIUM 50 MG PO TABS: 50.0000 mg | ORAL_TABLET | Freq: Every day | ORAL | Status: DC

## 2012-07-24 MED ORDER — FUROSEMIDE 20 MG PO TABS: ORAL_TABLET | ORAL | Status: DC

## 2012-07-24 MED ORDER — SPIRONOLACTONE 25 MG PO TABS: 25.0000 mg | ORAL_TABLET | Freq: Every day | ORAL | Status: DC

## 2012-07-24 NOTE — Assessment & Plan Note (Signed)
Controlled but not optimal for systolic dysfunction.  Losartan is restarted. Will check an echo in 3 months for evaluation of LVEF improvement. He may be a candidate for ICD.

## 2012-07-24 NOTE — Progress Notes (Signed)
   HPI: Mr.Banghart is a 56 y/o patient of Dr. Eden Emms we are following for ongoing assessment and management of nonischemic heart myopathy, severe systolic dysfunction with an EF of 50%, with cardiac catheterization revealing normal coronary anatomy. He was last seen in our office on 07/17/2012 after being seen in the ER for symptoms of PND but no overt edema. He admits to dietary noncompliance. The patient was started on spironolactone at 25 mg daily, and we rechecked it BMET and proBNP. Echocardiogram was ordered.   Echo demonstrated an EF of 25%. He was found to be high risk for ventricular arrhythmias, Coreg was increased to 25 mg twice a day. Labs demonstrated sodium 137 potassium 4.6 chloride 103 CO2 25 BUN 14 creatinine 1.17. It is noted that the patient is not on an ACE inhibitor. Will start him on low dose today. Left TM on this for approximately 3 months, and reassess his echo at that time for need for ICD placement.     No Known Allergies  Current Outpatient Prescriptions  Medication Sig Dispense Refill  . ALPRAZolam (XANAX) 1 MG tablet Take 1 mg by mouth 3 (three) times daily as needed. For anxiety      . carvedilol (COREG) 25 MG tablet Take 1 tablet (25 mg total) by mouth 2 (two) times daily with a meal.  60 tablet  3  . fexofenadine (ALLEGRA) 180 MG tablet Take 180 mg by mouth daily as needed (allergies).       . furosemide (LASIX) 20 MG tablet Take 1 tablet (20 mg total) by mouth daily.  90 tablet  3  . furosemide (LASIX) 20 MG tablet Take 1 tablet (20 mg total) by mouth 2 (two) times daily.  14 tablet  0  . HYDROcodone-acetaminophen (NORCO/VICODIN) 5-325 MG per tablet Take 1 tablet by mouth 4 (four) times daily as needed for pain.       Marland Kitchen losartan (COZAAR) 50 MG tablet Take 1 tablet (50 mg total) by mouth daily.  30 tablet  6  . metFORMIN (GLUCOPHAGE) 500 MG tablet Take 500 mg by mouth daily.      . potassium chloride SA (K-DUR,KLOR-CON) 20 MEQ tablet Take 1 tablet (20 mEq total) by  mouth daily.  30 tablet  5  . simvastatin (ZOCOR) 40 MG tablet       . spironolactone (ALDACTONE) 25 MG tablet Take 1 tablet (25 mg total) by mouth daily.  30 tablet  6   No current facility-administered medications for this visit.    Past Medical History  Diagnosis Date  . Diabetes mellitus   . Fluid retention in legs   . CHF (congestive heart failure)     Past Surgical History  Procedure Laterality Date  . Hernia repair    . Colonoscopy  01/04/2012    Procedure: COLONOSCOPY;  Surgeon: West Bali, MD;  Location: [facility];  Service: Endoscopy;  Laterality: N/A;  1:30 PM    ROS: PHYSICAL EXAM There were no vitals taken for this visit.  EKG:  ASSESSMENT AND PLAN

## 2012-07-24 NOTE — Assessment & Plan Note (Signed)
Heart function is severely reduced. I have spoken with him about the medications he is taking and the need to be compliant with low sodium diet. He is note taking losartan 50mg  as directed. He is not sure about the spironolactone.  I will decrease lasix to 40 mg in am and 20 mg in pm. He will continue all other medications as directed with the exception of the potassium. He will have BMET drawn this week. Letter to keep him out of work until seen in one month

## 2012-07-24 NOTE — Patient Instructions (Addendum)
Your physician recommends that you schedule a follow-up appointment in: 1 MONTH  Your physician has recommended you make the following change in your medication: 1. Lasix 40 mg in AM     Lasix 20 mg in PM 2. Losartan 50 mg daily 3. Discontinue Potassium Make sure you take SPIRONOLACTONE daily.   Your physician recommends that you return for lab work This week by Thursday. For BMET

## 2012-07-26 ENCOUNTER — Encounter: Payer: Self-pay | Admitting: *Deleted

## 2012-07-26 LAB — BASIC METABOLIC PANEL
BUN: 17 mg/dL (ref 6–23)
CO2: 25 mEq/L (ref 19–32)
Calcium: 8.8 mg/dL (ref 8.4–10.5)
Chloride: 103 mEq/L (ref 96–112)
Creat: 1.2 mg/dL (ref 0.50–1.35)
Glucose, Bld: 106 mg/dL — ABNORMAL HIGH (ref 70–99)
Potassium: 4.1 mEq/L (ref 3.5–5.3)
Sodium: 138 mEq/L (ref 135–145)

## 2012-08-24 ENCOUNTER — Encounter: Payer: Self-pay | Admitting: *Deleted

## 2012-08-24 ENCOUNTER — Ambulatory Visit: Payer: 59 | Admitting: Adult Health

## 2012-09-06 ENCOUNTER — Ambulatory Visit (INDEPENDENT_AMBULATORY_CARE_PROVIDER_SITE_OTHER): Payer: 59 | Admitting: Adult Health

## 2012-09-06 ENCOUNTER — Encounter: Payer: Self-pay | Admitting: Adult Health

## 2012-09-06 ENCOUNTER — Encounter: Payer: Self-pay | Admitting: *Deleted

## 2012-09-06 VITALS — BP 108/69 | HR 82 | Ht 72.0 in | Wt 204.8 lb

## 2012-09-06 DIAGNOSIS — R059 Cough, unspecified: Secondary | ICD-10-CM

## 2012-09-06 DIAGNOSIS — I5021 Acute systolic (congestive) heart failure: Secondary | ICD-10-CM

## 2012-09-06 DIAGNOSIS — R05 Cough: Secondary | ICD-10-CM

## 2012-09-06 DIAGNOSIS — I509 Heart failure, unspecified: Secondary | ICD-10-CM

## 2012-09-06 DIAGNOSIS — I10 Essential (primary) hypertension: Secondary | ICD-10-CM

## 2012-09-06 NOTE — Patient Instructions (Addendum)
Your physician recommends that you schedule a follow-up appointment in: 1 month   Your physician has requested that you have an echocardiogram. Echocardiography is a painless test that uses sound waves to create images of your heart. It provides your doctor with information about the size and shape of your heart and how well your heart's chambers and valves are working. This procedure takes approximately one hour. There are no restrictions for this procedure.  A chest x-ray takes a picture of the organs and structures inside the chest, including the heart, lungs, and blood vessels. This test can show several things, including, whether the heart is enlarges; whether fluid is building up in the lungs; and whether pacemaker / defibrillator leads are still in place.

## 2012-09-06 NOTE — Assessment & Plan Note (Signed)
Blood pressure is normal for patient with significant systolic dysfunction. Will continue current medication regimen to include coreg, ARB, lasix and spironolactone. Echo is pending.

## 2012-09-06 NOTE — Assessment & Plan Note (Signed)
He has gained about 8 lbs since being seen last. He went on a cruise, but states he mostly ate salads. He denies shortness of breath, but has some chest congestion. I will have a CXR completed and repeat his echo for ongoing assessment of his systolic fx. He will follow up with Dr.Branch for ongoing treatment and to be established with cardiologist.

## 2012-09-06 NOTE — Progress Notes (Signed)
HPI: [name] is a 57 year old patient of Dr. Eden Emms we are following for ongoing assessment and management of nonischemic cardiomyopathy, here systolic dysfunction with an EF of 25%, cardiac catheterization revealing normal coronary anatomy. He was last seen in the office on 07/24/2012 he has a history of medical noncompliance, and noncompliance with low sodium diet. Lasix was adjusted to 40 mg in a.m. and 20 mg in the p.m. A followup BMET was drawn losartan was restarted. He was continued on spironolactone.   Lab results reveal a sodium 138 potassium 4.1 chloride 103 CO2 25 BUN 17 creatinine 1.2 glucose 106.  He comes today with complaints of fatigue, but no dyspnea or chest pain. He has been coughing. He has not yet returned to work. He is medically complaint.  No Known Allergies  Current Outpatient Prescriptions  Medication Sig Dispense Refill  . ALPRAZolam (XANAX) 1 MG tablet Take 1 mg by mouth 3 (three) times daily as needed. For anxiety      . carvedilol (COREG) 25 MG tablet Take 1 tablet (25 mg total) by mouth 2 (two) times daily with a meal.  60 tablet  3  . fexofenadine (ALLEGRA) 180 MG tablet Take 180 mg by mouth daily as needed (allergies).       . furosemide (LASIX) 20 MG tablet TAKE 2  tab in the morning and 1 tab in the evening.  270 tablet  1  . HYDROcodone-acetaminophen (NORCO/VICODIN) 5-325 MG per tablet Take 1 tablet by mouth 4 (four) times daily as needed for pain.       Marland Kitchen Hydrocodone-Acetaminophen 10-300 MG TABS       . losartan (COZAAR) 50 MG tablet Take 1 tablet (50 mg total) by mouth daily.  90 tablet  3  . metFORMIN (GLUCOPHAGE) 500 MG tablet Take 500 mg by mouth daily.      . potassium chloride SA (K-DUR,KLOR-CON) 20 MEQ tablet       . simvastatin (ZOCOR) 40 MG tablet       . spironolactone (ALDACTONE) 25 MG tablet Take 1 tablet (25 mg total) by mouth daily.  30 tablet  6   No current facility-administered medications for this visit.    Past Medical History    Diagnosis Date  . Diabetes mellitus   . Fluid retention in legs   . CHF (congestive heart failure)     Past Surgical History  Procedure Laterality Date  . Hernia repair    . Colonoscopy  01/04/2012    Procedure: COLONOSCOPY;  Surgeon: West Bali, MD;  Location: [facility];  Service: Endoscopy;  Laterality: N/A;  1:30 PM    ROS: Review of systems complete and found to be negative unless listed above  PHYSICAL EXAM BP 108/69  Pulse 82  Ht 6' (1.829 m)  Wt 204 lb 12 oz (92.874 kg)  BMI 27.76 kg/m2  General: Well developed, well nourished, in no acute distress Head: Eyes PERRLA, No xanthomas.   Normal cephalic and atramatic  Lungs: Bilateral crackles with some expiratory wheezes, not cleared with cough. Heart: HRRR S1 S2, without MRG.  Pulses are 2+ & equal.            No carotid bruit. No JVD.   Abdomen: Bowel sounds are positive, abdomen soft and non-tender without masses or                  Hernia's noted. Msk:  Back normal, normal gait. Normal strength and tone for age. Extremities: No  clubbing, cyanosis or edema.  DP +1 Neuro: Alert and oriented X 3. Psych:  Good affect, responds appropriately    ASSESSMENT AND PLAN

## 2012-09-06 NOTE — Progress Notes (Deleted)
Name: [name] [name]    DOB: 1955-04-10  Age: 57 y.o.  MR#: [id number]       PCP:  Alice Reichert, MD      Insurance: Payor: [employer] / Plan: Research scientist (life sciences) / Product Type: *No Product type* /   CC:   No chief complaint on file.   VS Filed Vitals:   09/06/12 1318  BP: 108/69  Pulse: 82  Height: 6' (1.829 m)  Weight: 204 lb 12 oz (92.874 kg)    Weights Current Weight  09/06/12 204 lb 12 oz (92.874 kg)  07/24/12 192 lb (87.091 kg)  07/17/12 196 lb 4 oz (89.018 kg)    Blood Pressure  BP Readings from Last 3 Encounters:  09/06/12 108/69  07/24/12 119/83  07/17/12 136/88     Admit date:  (Not on file) Last encounter with RMR:  07/24/2012   Allergy Review of patient's allergies indicates no known allergies.  Current Outpatient Prescriptions  Medication Sig Dispense Refill  . ALPRAZolam (XANAX) 1 MG tablet Take 1 mg by mouth 3 (three) times daily as needed. For anxiety      . carvedilol (COREG) 25 MG tablet Take 1 tablet (25 mg total) by mouth 2 (two) times daily with a meal.  60 tablet  3  . fexofenadine (ALLEGRA) 180 MG tablet Take 180 mg by mouth daily as needed (allergies).       . furosemide (LASIX) 20 MG tablet TAKE 2  tab in the morning and 1 tab in the evening.  270 tablet  1  . HYDROcodone-acetaminophen (NORCO/VICODIN) 5-325 MG per tablet Take 1 tablet by mouth 4 (four) times daily as needed for pain.       Marland Kitchen Hydrocodone-Acetaminophen 10-300 MG TABS       . losartan (COZAAR) 50 MG tablet Take 1 tablet (50 mg total) by mouth daily.  90 tablet  3  . metFORMIN (GLUCOPHAGE) 500 MG tablet Take 500 mg by mouth daily.      . potassium chloride SA (K-DUR,KLOR-CON) 20 MEQ tablet       . simvastatin (ZOCOR) 40 MG tablet       . spironolactone (ALDACTONE) 25 MG tablet Take 1 tablet (25 mg total) by mouth daily.  30 tablet  6   No current facility-administered medications for this visit.    Discontinued Meds:   There are no discontinued medications.  Patient Active Problem  List   Diagnosis Date Noted  . Acute systolic heart failure 07/10/2011  . CHF (congestive heart failure) 07/08/2011  . Diabetes mellitus 07/08/2011  . Hypertension 07/08/2011    LABS    Component Value Date/Time   NA 138 07/24/2012 1204   NA 137 07/22/2012 1045   NA 139 07/17/2012 1240   K 4.1 07/24/2012 1204   K 4.6 07/22/2012 1045   K 4.2 07/17/2012 1240   CL 103 07/24/2012 1204   CL 103 07/22/2012 1045   CL 103 07/17/2012 1240   CO2 25 07/24/2012 1204   CO2 25 07/22/2012 1045   CO2 24 07/17/2012 1240   GLUCOSE 106* 07/24/2012 1204   GLUCOSE 121* 07/22/2012 1045   GLUCOSE 94 07/17/2012 1240   BUN 17 07/24/2012 1204   BUN 14 07/22/2012 1045   BUN 14 07/17/2012 1240   CREATININE 1.20 07/24/2012 1204   CREATININE 1.17 07/22/2012 1045   CREATININE 1.00 07/17/2012 1240   CREATININE 1.00 07/09/2012 1015   CREATININE 1.13 07/10/2011 0434   CREATININE 1.25 07/09/2011 1648   CALCIUM  8.8 07/24/2012 1204   CALCIUM 8.9 07/22/2012 1045   CALCIUM 8.7 07/17/2012 1240   GFRNONAA 83* 07/09/2012 1015   GFRNONAA 72* 07/10/2011 0434   GFRNONAA 64* 07/09/2011 1648   GFRAA >90 07/09/2012 1015   GFRAA 83* 07/10/2011 0434   GFRAA 74* 07/09/2011 1648   CMP     Component Value Date/Time   NA 138 07/24/2012 1204   K 4.1 07/24/2012 1204   CL 103 07/24/2012 1204   CO2 25 07/24/2012 1204   GLUCOSE 106* 07/24/2012 1204   BUN 17 07/24/2012 1204   CREATININE 1.20 07/24/2012 1204   CREATININE 1.00 07/09/2012 1015   CALCIUM 8.8 07/24/2012 1204   PROT 7.1 07/17/2012 1240   ALBUMIN 3.9 07/17/2012 1240   AST 12 07/17/2012 1240   ALT 9 07/17/2012 1240   ALKPHOS 93 07/17/2012 1240   BILITOT 0.7 07/17/2012 1240   GFRNONAA 83* 07/09/2012 1015   GFRAA >90 07/09/2012 1015       Component Value Date/Time   WBC 7.6 07/09/2012 1015   WBC 6.2 05/04/2012 1352   WBC 7.6 07/09/2011 1648   HGB 13.6 07/09/2012 1015   HGB 14.9 05/04/2012 1352   HGB 19.1* 07/09/2011 1648   HCT 40.1 07/09/2012 1015   HCT 42.5 05/04/2012 1352   HCT 54.9* 07/09/2011 1648   MCV 96.2 07/09/2012  1015   MCV 94.7 05/04/2012 1352   MCV 98.7 07/09/2011 1648    Lipid Panel     Component Value Date/Time   CHOL 169 07/09/2011 0540   TRIG 152* 07/09/2011 0540   HDL 45 07/09/2011 0540   CHOLHDL 3.8 07/09/2011 0540   VLDL 30 07/09/2011 0540   LDLCALC 94 07/09/2011 0540    ABG    Component Value Date/Time   PHART 7.452* 07/09/2011 1504   PCO2ART 32.4* 07/09/2011 1504   PO2ART 63.0* 07/09/2011 1504   HCO3 25.7* 07/09/2011 1513   TCO2 27 07/09/2011 1513   O2SAT 62.0 07/09/2011 1513     Lab Results  Component Value Date   TSH 3.881 05/04/2012   BNP (last 3 results)  Recent Labs  07/09/12 1015 07/17/12 1240  PROBNP 6063.0* 4864.00*   Cardiac Panel (last 3 results) No results found for this basename: CKTOTAL, CKMB, TROPONINI, RELINDX,  in the last 72 hours  Iron/TIBC/Ferritin No results found for this basename: iron, tibc, ferritin     EKG Orders placed in visit on 07/17/12  . EKG 12-LEAD     Prior Assessment and Plan Problem List as of 09/06/2012     Cardiovascular and Mediastinum   CHF (congestive heart failure)   Last Assessment & Plan   07/24/2012 Office Visit Written 07/24/2012 11:52 AM by Jodelle Gross, NP     Heart function is severely reduced. I have spoken with him about the medications he is taking and the need to be compliant with low sodium diet. He is note taking losartan 50mg  as directed. He is not sure about the spironolactone.  I will decrease lasix to 40 mg in am and 20 mg in pm. He will continue all other medications as directed with the exception of the potassium. He will have BMET drawn this week. Letter to keep him out of work until seen in one month    Hypertension   Last Assessment & Plan   07/24/2012 Office Visit Written 07/24/2012 11:54 AM by Jodelle Gross, NP     Controlled but not optimal for systolic dysfunction.  Losartan is restarted. Will  check an echo in 3 months for evaluation of LVEF improvement. He may be a candidate for ICD.    Acute  systolic heart failure     Endocrine   Diabetes mellitus       Imaging: No results found.

## 2012-09-08 ENCOUNTER — Ambulatory Visit (HOSPITAL_COMMUNITY)
Admission: RE | Admit: 2012-09-08 | Discharge: 2012-09-08 | Disposition: A | Discharge status: Home / Self Care | Payer: 59 | Source: Ambulatory Visit | Attending: Adult Health | Admitting: Adult Health

## 2012-09-08 ENCOUNTER — Encounter: Payer: Self-pay | Admitting: Adult Health

## 2012-09-08 DIAGNOSIS — R05 Cough: Secondary | ICD-10-CM

## 2012-09-08 DIAGNOSIS — I5021 Acute systolic (congestive) heart failure: Secondary | ICD-10-CM

## 2012-09-08 DIAGNOSIS — I509 Heart failure, unspecified: Secondary | ICD-10-CM

## 2012-09-08 DIAGNOSIS — I10 Essential (primary) hypertension: Secondary | ICD-10-CM | POA: Insufficient documentation

## 2012-09-08 DIAGNOSIS — E119 Type 2 diabetes mellitus without complications: Secondary | ICD-10-CM | POA: Insufficient documentation

## 2012-09-08 DIAGNOSIS — R059 Cough, unspecified: Secondary | ICD-10-CM

## 2012-09-08 DIAGNOSIS — I517 Cardiomegaly: Secondary | ICD-10-CM

## 2012-09-08 NOTE — Progress Notes (Signed)
*  PRELIMINARY RESULTS* Echocardiogram 2D Echocardiogram has been performed.  [name] [name]  09/08/2012, 8:47 AM

## 2012-09-29 ENCOUNTER — Ambulatory Visit (INDEPENDENT_AMBULATORY_CARE_PROVIDER_SITE_OTHER): Payer: 59 | Admitting: Internal Medicine

## 2012-09-29 ENCOUNTER — Encounter: Payer: Self-pay | Admitting: Internal Medicine

## 2012-09-29 VITALS — BP 110/68 | HR 71 | Ht 72.0 in | Wt 205.0 lb

## 2012-09-29 DIAGNOSIS — I10 Essential (primary) hypertension: Secondary | ICD-10-CM

## 2012-09-29 DIAGNOSIS — I509 Heart failure, unspecified: Secondary | ICD-10-CM

## 2012-09-29 NOTE — Progress Notes (Signed)
HPI [name] [name] is referred today for consideration for prophylactic ICD implantation. The patient is a very pleasant 57 year old man with chronic systolic heart failure, initially diagnosed approximately 15 months ago. Initially his ejection fraction was 15%, and after medical therapy improved to 40%. Over the last 3 months,  Repeat echocardiography demonstrates that his ejection fraction is reduced back to 20-25%. On maximal medical therapy, his heart failure symptoms are class II. He takes Aldactone, losartan, furosemide, and carvedilol. He missed to some dietary indiscretion with sodium, and some medical noncompliance. No Known Allergies   Current Outpatient Prescriptions  Medication Sig Dispense Refill  . ALPRAZolam (XANAX) 1 MG tablet Take 1 mg by mouth 3 (three) times daily as needed. For anxiety      . carvedilol (COREG) 25 MG tablet Take 1 tablet (25 mg total) by mouth 2 (two) times daily with a meal.  60 tablet  3  . fexofenadine (ALLEGRA) 180 MG tablet Take 180 mg by mouth daily as needed (allergies).       . furosemide (LASIX) 20 MG tablet TAKE 2  tab in the morning and 1 tab in the evening.  270 tablet  1  . HYDROcodone-acetaminophen (NORCO/VICODIN) 5-325 MG per tablet Take 1 tablet by mouth 4 (four) times daily as needed for pain.       Marland Kitchen losartan (COZAAR) 50 MG tablet Take 1 tablet (50 mg total) by mouth daily.  90 tablet  3  . metFORMIN (GLUCOPHAGE) 500 MG tablet Take 500 mg by mouth 2 (two) times daily with a meal.       . simvastatin (ZOCOR) 40 MG tablet Take 40 mg by mouth at bedtime.       Marland Kitchen spironolactone (ALDACTONE) 25 MG tablet Take 1 tablet (25 mg total) by mouth daily.  30 tablet  6   No current facility-administered medications for this visit.     Past Medical History  Diagnosis Date  . Diabetes mellitus   . Fluid retention in legs   . CHF (congestive heart failure)     ROS:   All systems reviewed and negative except as noted in the HPI.   Past Surgical  History  Procedure Laterality Date  . Hernia repair    . Colonoscopy  01/04/2012    Procedure: COLONOSCOPY;  Surgeon: West Bali, MD;  Location: [facility];  Service: Endoscopy;  Laterality: N/A;  1:30 PM     Family History  Problem Relation Age of Onset  . Heart attack Father   . Heart failure Father   . Cancer Mother      History   Social History  . Marital Status: Married    Spouse Name: N/A    Number of Children: N/A  . Years of Education: N/A   Occupational History  . Not on file.   Social History Main Topics  . Smoking status: Former Smoker -- 0.50 packs/day  . Smokeless tobacco: Not on file     Comment: electronic cig for 4 months   . Alcohol Use: Yes     Comment: weekends  . Drug Use: No  . Sexual Activity: Not on file   Other Topics Concern  . Not on file   Social History Narrative  . No narrative on file     BP 110/68  Pulse 71  Ht 6' (1.829 m)  Wt 205 lb (92.987 kg)  BMI 27.8 kg/m2  SpO2 97%  Physical Exam:  Well appearing 57 year old man, NAD HEENT: Unremarkable  Neck:  7 cm JVD, no thyromegally Back:  No CVA tenderness Lungs:  Clear with no wheezes, rales, or rhonchi. HEART:  Regular rate rhythm, no murmurs, no rubs, no clicks, PMI is enlarged Abd:  soft, positive bowel sounds, no organomegally, no rebound, no guarding Ext:  2 plus pulses, no edema, no cyanosis, no clubbing Skin:  No rashes no nodules Neuro:  CN II through XII intact, motor grossly intact  EKG - normal sinus rhythm with right bundle branch block  Assess/Plan:

## 2012-09-29 NOTE — Patient Instructions (Addendum)
Your physician recommends that you schedule a follow-up appointment in: 6 months with Dr Ladona Ridgel  Your physician has requested that you have an echocardiogram. Echocardiography is a painless test that uses sound waves to create images of your heart. It provides your doctor with information about the size and shape of your heart and how well your heart's chambers and valves are working. This procedure takes approximately one hour. There are no restrictions for this procedure. 6 months

## 2012-09-29 NOTE — Assessment & Plan Note (Signed)
His blood pressure is very well controlled. He'll maintain a low-sodium diet, and continue his current medical therapy.

## 2012-09-29 NOTE — Assessment & Plan Note (Signed)
His chronic systolic heart failure remains class II with an ejection fraction of 25% despite maximal medical therapy. I've discussed the treatment options with the patient with regard to ICD implantation. The risk, goals, benefits, and expectations of ICD therapy have been outlined in detail. The patient is interested in pursuing ICD therapy only if his ejection fraction doesn't improve at a later date. As such, we will plan to see him back in 6 months, and repeat his 2-D echocardiogram in the interim.

## 2012-10-04 ENCOUNTER — Telehealth: Payer: Self-pay | Admitting: *Deleted

## 2012-10-04 NOTE — Telephone Encounter (Signed)
Enter in error

## 2012-10-06 ENCOUNTER — Encounter: Payer: Self-pay | Admitting: Cardiology

## 2012-10-06 ENCOUNTER — Encounter: Payer: 59 | Admitting: Cardiology

## 2012-10-06 NOTE — Progress Notes (Signed)
Clinical Summary [name] [name] is a 57 y.o.male  1. Chronic systolic heart  - dx approx 15 months ago, initial LVEF 15% which improved to 40% with medical therapy, then reduced to 20-25%. - NICM, cath showed normal coronaries -seen by Dr Ladona Ridgel for possible ICD , will decide after repeat echo in approx 6 months (approx 03/2013)   Past Medical History  Diagnosis Date  . Diabetes mellitus   . Fluid retention in legs   . CHF (congestive heart failure)      No Known Allergies   Current Outpatient Prescriptions  Medication Sig Dispense Refill  . ALPRAZolam (XANAX) 1 MG tablet Take 1 mg by mouth 3 (three) times daily as needed. For anxiety      . carvedilol (COREG) 25 MG tablet Take 1 tablet (25 mg total) by mouth 2 (two) times daily with a meal.  60 tablet  3  . fexofenadine (ALLEGRA) 180 MG tablet Take 180 mg by mouth daily as needed (allergies).       . furosemide (LASIX) 20 MG tablet TAKE 2  tab in the morning and 1 tab in the evening.  270 tablet  1  . HYDROcodone-acetaminophen (NORCO/VICODIN) 5-325 MG per tablet Take 1 tablet by mouth 4 (four) times daily as needed for pain.       Marland Kitchen losartan (COZAAR) 50 MG tablet Take 1 tablet (50 mg total) by mouth daily.  90 tablet  3  . metFORMIN (GLUCOPHAGE) 500 MG tablet Take 500 mg by mouth 2 (two) times daily with a meal.       . simvastatin (ZOCOR) 40 MG tablet Take 40 mg by mouth at bedtime.       Marland Kitchen spironolactone (ALDACTONE) 25 MG tablet Take 1 tablet (25 mg total) by mouth daily.  30 tablet  6   No current facility-administered medications for this visit.     Past Surgical History  Procedure Laterality Date  . Hernia repair    . Colonoscopy  01/04/2012    Procedure: COLONOSCOPY;  Surgeon: West Bali, MD;  Location: [facility];  Service: Endoscopy;  Laterality: N/A;  1:30 PM     No Known Allergies    Family History  Problem Relation Age of Onset  . Heart attack Father   . Heart failure Father   . Cancer Mother       Social History Mr. Abalos reports that he has quit smoking. He does not have any smokeless tobacco history on file. Mr. Freeland reports that  drinks alcohol.   Review of Systems 12 point ROS negative other than reported in HPI  Physical Examination There were no vitals filed for this visit. There were no vitals filed for this visit.  Gen: resting comfortably, NAD HEENT: no scleral icterus, pupils equal round and reactive, no palptable cervical adenopathy CV Pulm: CTAB Abd: soft, NT, ND NABS, no hepatosplenomegaly Ext: warm, no edema.  Skin: warm, no rash Neuro: A&Ox3, no focal deficits    Diagnostic Studies 09/08/12 Echo: LVEF 20-25%, moderate basal septal hypertrophy, diffuse global hypokinesis, grade II diastolic dysfunction, mod LAE, mod RV dysfunction,   07/28/2012 Echo: LVEF 20-25% 09/2011 Echo: LVEF 40% 06/2011 Echo: LVEF 15%  06/2011 [facility] Procedural Findings:  Hemodynamics  RA 6/4with a mean of 2 mmHg  RV 20/1 mmHg  PA 31/13 with a mean of 21 mmHg  PCWP 11/9 with a mean of 6 mm mercury  LV 104/4 mmHg  AO 98/72 with a mean of  84 mmHg  Oxygen saturations:  PA 62%  AO 93%  Cardiac Output (Fick) 4.42 L per minute  Cardiac Index (Fick) 2.1 L per minute per meter square  Coronary angiography:  Coronary dominance: right  Left mainstem: Normal  Left anterior descending (LAD): Normal  Left circumflex (LCx): Normal  Right coronary artery (RCA): Normal  Left ventriculography: Limited injection demonstrates left ventricular enlargement with severe global hypokinesis. Ejection fraction is estimated at 15-20%.  Final Conclusions:  1. Normal coronary anatomy.  2. Severe left ventricular dysfunction.  3. Normal right heart pressures.     Assessment and Plan     Antoine Poche, M.D., F.A.C.C.

## 2012-11-01 ENCOUNTER — Ambulatory Visit (INDEPENDENT_AMBULATORY_CARE_PROVIDER_SITE_OTHER): Payer: 59 | Admitting: Adult Health

## 2012-11-01 ENCOUNTER — Ambulatory Visit (HOSPITAL_COMMUNITY)
Admission: RE | Admit: 2012-11-01 | Discharge: 2012-11-01 | Disposition: A | Discharge status: Home / Self Care | Payer: 59 | Source: Ambulatory Visit | Attending: Family Medicine | Admitting: Family Medicine

## 2012-11-01 ENCOUNTER — Other Ambulatory Visit (HOSPITAL_COMMUNITY): Payer: Self-pay | Admitting: Family Medicine

## 2012-11-01 ENCOUNTER — Encounter: Payer: Self-pay | Admitting: Adult Health

## 2012-11-01 VITALS — BP 97/63 | HR 93 | Ht 72.0 in | Wt 206.0 lb

## 2012-11-01 DIAGNOSIS — E119 Type 2 diabetes mellitus without complications: Secondary | ICD-10-CM

## 2012-11-01 DIAGNOSIS — M25559 Pain in unspecified hip: Secondary | ICD-10-CM | POA: Insufficient documentation

## 2012-11-01 DIAGNOSIS — M25561 Pain in right knee: Secondary | ICD-10-CM

## 2012-11-01 DIAGNOSIS — I5022 Chronic systolic (congestive) heart failure: Secondary | ICD-10-CM

## 2012-11-01 DIAGNOSIS — M25551 Pain in right hip: Secondary | ICD-10-CM

## 2012-11-01 DIAGNOSIS — M25562 Pain in left knee: Secondary | ICD-10-CM

## 2012-11-01 DIAGNOSIS — M25569 Pain in unspecified knee: Secondary | ICD-10-CM | POA: Insufficient documentation

## 2012-11-01 DIAGNOSIS — I5032 Chronic diastolic (congestive) heart failure: Secondary | ICD-10-CM

## 2012-11-01 DIAGNOSIS — I10 Essential (primary) hypertension: Secondary | ICD-10-CM

## 2012-11-01 MED ORDER — SPIRONOLACTONE 25 MG PO TABS: 12.5000 mg | ORAL_TABLET | Freq: Every day | ORAL | Status: DC

## 2012-11-01 NOTE — Progress Notes (Signed)
HPI: [name] to 57 year old patient normally followed by Dr. Eden Emms that we see for assessment and management of nonischemic heart myopathy, most recent echocardiogram demonstrating EF of 25%. Most recent cardiac catheterization revealed normal coronary anatomy. Was last seen in the office in August of 2014 complaints of fatigue but no dyspnea or chest pain. The patient was given rotation to return to work. He was to followup with Dr. Wyline Mood.     On 09/29/2012 he was seen by Dr. Sharrell Ku for consideration of prophylactic ICD implantation. Her neck evaluation the patient was found to be a candidate for implantation, but the patient wished to wait to have this implanted and wished to continue medical therapy. He was to followup with Dr. Ladona Ridgel in 6 months and repeat his 2-D echocardiogram prior to that visit. Continue on current medical management.    He comes today is with complaints of positional dizziness. Cc usually occurs when getting up out of a chair or out of the bed. Last a few seconds and then normalizes. He has no syncope. He is medically compliant. Denies palpitations or discomfort in his chest.  No Known Allergies  Current Outpatient Prescriptions  Medication Sig Dispense Refill  . ALPRAZolam (XANAX) 1 MG tablet Take 1 mg by mouth 3 (three) times daily as needed. For anxiety      . carvedilol (COREG) 25 MG tablet Take 1 tablet (25 mg total) by mouth 2 (two) times daily with a meal.  60 tablet  3  . fexofenadine (ALLEGRA) 180 MG tablet Take 180 mg by mouth daily as needed (allergies).       . furosemide (LASIX) 20 MG tablet TAKE 2  tab in the morning and 1 tab in the evening.  270 tablet  1  . HYDROcodone-acetaminophen (NORCO/VICODIN) 5-325 MG per tablet Take 1 tablet by mouth 4 (four) times daily as needed for pain.       Marland Kitchen losartan (COZAAR) 50 MG tablet Take 1 tablet (50 mg total) by mouth daily.  90 tablet  3  . metFORMIN (GLUCOPHAGE) 500 MG tablet Take 500 mg by mouth 2 (two)  times daily with a meal.       . simvastatin (ZOCOR) 40 MG tablet Take 40 mg by mouth at bedtime.       Marland Kitchen spironolactone (ALDACTONE) 25 MG tablet Take 0.5 tablets (12.5 mg total) by mouth daily.  30 tablet  6   No current facility-administered medications for this visit.    Past Medical History  Diagnosis Date  . Diabetes mellitus   . Fluid retention in legs   . CHF (congestive heart failure)     Past Surgical History  Procedure Laterality Date  . Hernia repair    . Colonoscopy  01/04/2012    Procedure: COLONOSCOPY;  Surgeon: West Bali, MD;  Location: [facility];  Service: Endoscopy;  Laterality: N/A;  1:30 PM    ZOX:WRUEAV of systems complete and found to be negative unless listed above  PHYSICAL EXAM BP 97/63  Pulse 93  Ht 6' (1.829 m)  Wt 206 lb (93.441 kg)  BMI 27.93 kg/m2  General: Well developed, well nourished, in no acute distress Head: Eyes PERRLA, No xanthomas.   Normal cephalic and atraumatic Lungs: Clear bilaterally to auscultation and percussion. Heart: HRRR S1 S2, distant sounds without MRG.  Pulses are 2+ & equal.            No carotid bruit. No JVD.  No abdominal bruits.  No femoral bruits. Abdomen: Bowel sounds are positive, abdomen soft and non-tender without masses or                  Hernia's noted. Msk:  Back normal, normal gait. Normal strength and tone for age. Extremities: No clubbing, cyanosis or edema.  DP +1 Neuro: Alert and oriented X 3. Psych:  Good affect, responds appropriately    ASSESSMENT AND PLAN

## 2012-11-01 NOTE — Patient Instructions (Signed)
Your physician recommends that you schedule a follow-up appointment in: 3 months  Your physician has recommended you make the following change in your medication:  1. Start Spirolactone 12.5 mg daily.  Your physician recommends that you return for lab work  In 3 months. We will send you lab slips in the mail when your labs are due.  Your physician has requested that you have an echocardiogram. Echocardiography is a painless test that uses sound waves to create images of your heart. It provides your doctor with information about the size and shape of your heart and how well your heart's chambers and valves are working. This procedure takes approximately one hour. There are no restrictions for this procedure.

## 2012-11-01 NOTE — Assessment & Plan Note (Signed)
He is doing well. Medically compliant. His main complaint is of dizziness with position changes. Orthostatic blood pressures were completed and he is not positive that there is a change in his blood pressure from sitting to standing. Sitting blood pressure 113/66 with a heart rate of 81, standing blood pressure 97/60 with a heart rate of 86.    I have advised him not to get up from a sitting or lying position too quickly and have any rate between sitting and standing, possibly counting to 10 or 20 before standing. I have explained to him with his systolic dysfunction, a low blood pressure is expected and recommended until his heart function improves. I will decrease his spironolactone from 25 mg to 12.5 mg daily to assist with some of the positional dizziness. He is advised a low sodium diet. Will followup with the [facility] and it echocardiogram in 3 months.

## 2012-11-01 NOTE — Assessment & Plan Note (Signed)
Continue management per PCP.

## 2012-11-01 NOTE — Progress Notes (Deleted)
Name: [name] [name]    DOB: 1955-04-18  Age: 57 y.o.  MR#: [id number]       PCP:  Alice Reichert, MD      Insurance: Payor: [employer] / Plan: Research scientist (life sciences) / Product Type: *No Product type* /   CC:    Chief Complaint  Patient presents with  . Hypertension  . Congestive Heart Failure    Systolic    VS Filed Vitals:   11/01/12 1428 11/01/12 1429 11/01/12 1430 11/01/12 1431  BP:  113/66 97/60 97/63   Pulse:  81 86 93  Height: 6' (1.829 m)  6' (1.829 m)   Weight: 206 lb (93.441 kg)  206 lb (93.441 kg)     Weights Current Weight  11/01/12 206 lb (93.441 kg)  09/29/12 205 lb (92.987 kg)  09/06/12 204 lb 12 oz (92.874 kg)    Blood Pressure  BP Readings from Last 3 Encounters:  11/01/12 97/63  09/29/12 110/68  09/06/12 108/69     Admit date:  (Not on file) Last encounter with RMR:  09/06/2012   Allergy Review of patient's allergies indicates no known allergies.  Current Outpatient Prescriptions  Medication Sig Dispense Refill  . ALPRAZolam (XANAX) 1 MG tablet Take 1 mg by mouth 3 (three) times daily as needed. For anxiety      . carvedilol (COREG) 25 MG tablet Take 1 tablet (25 mg total) by mouth 2 (two) times daily with a meal.  60 tablet  3  . fexofenadine (ALLEGRA) 180 MG tablet Take 180 mg by mouth daily as needed (allergies).       . furosemide (LASIX) 20 MG tablet TAKE 2  tab in the morning and 1 tab in the evening.  270 tablet  1  . HYDROcodone-acetaminophen (NORCO/VICODIN) 5-325 MG per tablet Take 1 tablet by mouth 4 (four) times daily as needed for pain.       Marland Kitchen losartan (COZAAR) 50 MG tablet Take 1 tablet (50 mg total) by mouth daily.  90 tablet  3  . metFORMIN (GLUCOPHAGE) 500 MG tablet Take 500 mg by mouth 2 (two) times daily with a meal.       . simvastatin (ZOCOR) 40 MG tablet Take 40 mg by mouth at bedtime.       Marland Kitchen spironolactone (ALDACTONE) 25 MG tablet Take 1 tablet (25 mg total) by mouth daily.  30 tablet  6   No current facility-administered medications for  this visit.    Discontinued Meds:   There are no discontinued medications.  Patient Active Problem List   Diagnosis Date Noted  . Chronic systolic heart failure 07/08/2011    Priority: Medium  . Diabetes mellitus 07/08/2011  . Hypertension 07/08/2011    LABS    Component Value Date/Time   NA 138 07/24/2012 1204   NA 137 07/22/2012 1045   NA 139 07/17/2012 1240   K 4.1 07/24/2012 1204   K 4.6 07/22/2012 1045   K 4.2 07/17/2012 1240   CL 103 07/24/2012 1204   CL 103 07/22/2012 1045   CL 103 07/17/2012 1240   CO2 25 07/24/2012 1204   CO2 25 07/22/2012 1045   CO2 24 07/17/2012 1240   GLUCOSE 106* 07/24/2012 1204   GLUCOSE 121* 07/22/2012 1045   GLUCOSE 94 07/17/2012 1240   BUN 17 07/24/2012 1204   BUN 14 07/22/2012 1045   BUN 14 07/17/2012 1240   CREATININE 1.20 07/24/2012 1204   CREATININE 1.17 07/22/2012 1045   CREATININE 1.00 07/17/2012 1240  CREATININE 1.00 07/09/2012 1015   CREATININE 1.13 07/10/2011 0434   CREATININE 1.25 07/09/2011 1648   CALCIUM 8.8 07/24/2012 1204   CALCIUM 8.9 07/22/2012 1045   CALCIUM 8.7 07/17/2012 1240   GFRNONAA 83* 07/09/2012 1015   GFRNONAA 72* 07/10/2011 0434   GFRNONAA 64* 07/09/2011 1648   GFRAA >90 07/09/2012 1015   GFRAA 83* 07/10/2011 0434   GFRAA 74* 07/09/2011 1648   CMP     Component Value Date/Time   NA 138 07/24/2012 1204   K 4.1 07/24/2012 1204   CL 103 07/24/2012 1204   CO2 25 07/24/2012 1204   GLUCOSE 106* 07/24/2012 1204   BUN 17 07/24/2012 1204   CREATININE 1.20 07/24/2012 1204   CREATININE 1.00 07/09/2012 1015   CALCIUM 8.8 07/24/2012 1204   PROT 7.1 07/17/2012 1240   ALBUMIN 3.9 07/17/2012 1240   AST 12 07/17/2012 1240   ALT 9 07/17/2012 1240   ALKPHOS 93 07/17/2012 1240   BILITOT 0.7 07/17/2012 1240   GFRNONAA 83* 07/09/2012 1015   GFRAA >90 07/09/2012 1015       Component Value Date/Time   WBC 7.6 07/09/2012 1015   WBC 6.2 05/04/2012 1352   WBC 7.6 07/09/2011 1648   HGB 13.6 07/09/2012 1015   HGB 14.9 05/04/2012 1352   HGB 19.1* 07/09/2011 1648   HCT 40.1 07/09/2012  1015   HCT 42.5 05/04/2012 1352   HCT 54.9* 07/09/2011 1648   MCV 96.2 07/09/2012 1015   MCV 94.7 05/04/2012 1352   MCV 98.7 07/09/2011 1648    Lipid Panel     Component Value Date/Time   CHOL 169 07/09/2011 0540   TRIG 152* 07/09/2011 0540   HDL 45 07/09/2011 0540   CHOLHDL 3.8 07/09/2011 0540   VLDL 30 07/09/2011 0540   LDLCALC 94 07/09/2011 0540    ABG    Component Value Date/Time   PHART 7.452* 07/09/2011 1504   PCO2ART 32.4* 07/09/2011 1504   PO2ART 63.0* 07/09/2011 1504   HCO3 25.7* 07/09/2011 1513   TCO2 27 07/09/2011 1513   O2SAT 62.0 07/09/2011 1513     Lab Results  Component Value Date   TSH 3.881 05/04/2012   BNP (last 3 results)  Recent Labs  07/09/12 1015 07/17/12 1240  PROBNP 6063.0* 4864.00*   Cardiac Panel (last 3 results) No results found for this basename: CKTOTAL, CKMB, TROPONINI, RELINDX,  in the last 72 hours  Iron/TIBC/Ferritin No results found for this basename: iron, tibc, ferritin     EKG Orders placed in visit on 07/17/12  . EKG 12-LEAD     Prior Assessment and Plan Problem List as of 11/01/2012     Cardiovascular and Mediastinum   Chronic systolic heart failure   Last Assessment & Plan   09/29/2012 Office Visit Written 09/29/2012 12:13 PM by Marinus Maw, MD     His chronic systolic heart failure remains class II with an ejection fraction of 25% despite maximal medical therapy. I've discussed the treatment options with the patient with regard to ICD implantation. The risk, goals, benefits, and expectations of ICD therapy have been outlined in detail. The patient is interested in pursuing ICD therapy only if his ejection fraction doesn't improve at a later date. As such, we will plan to see him back in 6 months, and repeat his 2-D echocardiogram in the interim.    Hypertension   Last Assessment & Plan   09/29/2012 Office Visit Written 09/29/2012 12:14 PM by Marinus Maw, MD  His blood pressure is very well controlled. He'll maintain a  low-sodium diet, and continue his current medical therapy.      Endocrine   Diabetes mellitus       Imaging: No results found.

## 2012-11-01 NOTE — Assessment & Plan Note (Signed)
Blood pressure is low normal. I am not going to change any of his medications other than stated above. We will keep him at low blood pressure as we are continuing to treat his significant systolic dysfunction.

## 2012-12-21 ENCOUNTER — Other Ambulatory Visit: Payer: Self-pay | Admitting: *Deleted

## 2012-12-21 ENCOUNTER — Encounter: Payer: Self-pay | Admitting: *Deleted

## 2012-12-21 DIAGNOSIS — I10 Essential (primary) hypertension: Secondary | ICD-10-CM

## 2013-01-09 ENCOUNTER — Emergency Department (HOSPITAL_COMMUNITY)
Admission: EM | Admit: 2013-01-09 | Discharge: 2013-01-09 | Disposition: A | Discharge status: Home / Self Care | Payer: 59 | Attending: Emergency Medicine | Admitting: Emergency Medicine

## 2013-01-09 ENCOUNTER — Encounter (HOSPITAL_COMMUNITY): Payer: Self-pay | Admitting: Emergency Medicine

## 2013-01-09 DIAGNOSIS — E119 Type 2 diabetes mellitus without complications: Secondary | ICD-10-CM | POA: Insufficient documentation

## 2013-01-09 DIAGNOSIS — I509 Heart failure, unspecified: Secondary | ICD-10-CM | POA: Insufficient documentation

## 2013-01-09 DIAGNOSIS — J3489 Other specified disorders of nose and nasal sinuses: Secondary | ICD-10-CM | POA: Insufficient documentation

## 2013-01-09 DIAGNOSIS — Z87891 Personal history of nicotine dependence: Secondary | ICD-10-CM | POA: Insufficient documentation

## 2013-01-09 DIAGNOSIS — J209 Acute bronchitis, unspecified: Secondary | ICD-10-CM | POA: Insufficient documentation

## 2013-01-09 DIAGNOSIS — H109 Unspecified conjunctivitis: Secondary | ICD-10-CM

## 2013-01-09 DIAGNOSIS — J40 Bronchitis, not specified as acute or chronic: Secondary | ICD-10-CM

## 2013-01-09 DIAGNOSIS — Z79899 Other long term (current) drug therapy: Secondary | ICD-10-CM | POA: Insufficient documentation

## 2013-01-09 LAB — GLUCOSE, CAPILLARY: Glucose-Capillary: 109 mg/dL — ABNORMAL HIGH (ref 70–99)

## 2013-01-09 MED ORDER — ALBUTEROL SULFATE HFA 108 (90 BASE) MCG/ACT IN AERS
2.0000 | INHALATION_SPRAY | Freq: Once | RESPIRATORY_TRACT | Status: AC
  Administered 2013-01-09: 2 via RESPIRATORY_TRACT
  Filled 2013-01-09: qty 6.7

## 2013-01-09 MED ORDER — AZITHROMYCIN 250 MG PO TABS: ORAL_TABLET | ORAL | Status: DC

## 2013-01-09 MED ORDER — TOBRAMYCIN 0.3 % OP SOLN
1.0000 [drp] | Freq: Once | OPHTHALMIC | Status: AC
  Administered 2013-01-09: 1 [drp] via OPHTHALMIC
  Filled 2013-01-09: qty 5

## 2013-01-09 MED ORDER — AZITHROMYCIN 250 MG PO TABS
500.0000 mg | ORAL_TABLET | Freq: Once | ORAL | Status: AC
  Administered 2013-01-09: 500 mg via ORAL
  Filled 2013-01-09: qty 2

## 2013-01-09 NOTE — ED Notes (Signed)
Pt c/o dry cough x2 days as well as bilateral eye irritation and redness x2 days. Pt states "when I wake up my eyes are stuck together".

## 2013-01-09 NOTE — ED Provider Notes (Signed)
CSN: [id number]     Arrival date & time 01/09/13  1137 History   First MD Initiated Contact with Patient 01/09/13 1322     Chief Complaint  Patient presents with  . Cough  . Eye Problem   (Consider location/radiation/quality/duration/timing/severity/associated sxs/prior Treatment) Patient is a 57 y.o. male presenting with cough. The history is provided by the patient.  Cough Cough characteristics:  Productive Sputum characteristics:  Yellow Severity:  Mild Onset quality:  Gradual Duration:  2 days Timing:  Intermittent Progression:  Unchanged Chronicity:  New Smoker: former smoker.   Context: upper respiratory infection   Relieved by:  Nothing Worsened by:  Nothing tried Ineffective treatments:  None tried Associated symptoms: eye discharge and rhinorrhea   Associated symptoms: no chest pain, no chills, no diaphoresis, no ear pain, no fever, no headaches, no myalgias, no rash, no shortness of breath, no sinus congestion, no sore throat and no wheezing   Rhinorrhea:    Quality:  Clear   Severity:  Mild   Past Medical History  Diagnosis Date  . Diabetes mellitus   . Fluid retention in legs   . CHF (congestive heart failure)    Past Surgical History  Procedure Laterality Date  . Hernia repair    . Colonoscopy  01/04/2012    Procedure: COLONOSCOPY;  Surgeon: West Bali, MD;  Location: [facility];  Service: Endoscopy;  Laterality: N/A;  1:30 PM   Family History  Problem Relation Age of Onset  . Heart attack Father   . Heart failure Father   . Cancer Mother    History  Substance Use Topics  . Smoking status: Former Smoker -- 0.50 packs/day  . Smokeless tobacco: Not on file     Comment: electronic cig for 4 months   . Alcohol Use: Yes     Comment: weekends    Review of Systems  Constitutional: Negative for fever, chills, diaphoresis, activity change and appetite change.  HENT: Positive for congestion and rhinorrhea. Negative for ear pain, facial swelling,  sore throat and trouble swallowing.   Eyes: Positive for photophobia, discharge, redness and itching. Negative for visual disturbance.  Respiratory: Positive for cough. Negative for chest tightness, shortness of breath, wheezing and stridor.   Cardiovascular: Negative for chest pain and leg swelling.  Gastrointestinal: Negative for nausea, vomiting and abdominal pain.  Musculoskeletal: Negative for myalgias, neck pain and neck stiffness.  Skin: Negative.  Negative for rash.  Neurological: Negative for dizziness, weakness, numbness and headaches.  Hematological: Negative for adenopathy.  Psychiatric/Behavioral: Negative for confusion.  All other systems reviewed and are negative.    Allergies  Review of patient's allergies indicates no known allergies.  Home Medications   Current Outpatient Rx  Name  Route  Sig  Dispense  Refill  . ALPRAZolam (XANAX) 1 MG tablet   Oral   Take 1 mg by mouth 3 (three) times daily as needed. For anxiety         . carvedilol (COREG) 25 MG tablet   Oral   Take 1 tablet (25 mg total) by mouth 2 (two) times daily with a meal.   60 tablet   3   . furosemide (LASIX) 20 MG tablet      TAKE 2  tab in the morning and 1 tab in the evening.   270 tablet   1   . HYDROcodone-acetaminophen (NORCO/VICODIN) 5-325 MG per tablet   Oral   Take 1 tablet by mouth 4 (four) times daily as  needed for pain.          Marland Kitchen losartan (COZAAR) 50 MG tablet   Oral   Take 1 tablet (50 mg total) by mouth daily.   90 tablet   3   . metFORMIN (GLUCOPHAGE) 500 MG tablet   Oral   Take 500 mg by mouth 2 (two) times daily with a meal.          . naphazoline-glycerin (CLEAR EYES) 0.012-0.2 % SOLN   Both Eyes   Place 1-2 drops into both eyes every 8 (eight) hours as needed for irritation.         Marland Kitchen spironolactone (ALDACTONE) 25 MG tablet   Oral   Take 0.5 tablets (12.5 mg total) by mouth daily.   30 tablet   6   . simvastatin (ZOCOR) 40 MG tablet   Oral   Take  40 mg by mouth at bedtime.           BP 117/70  Pulse 82  Temp(Src) 98.6 F (37 C) (Oral)  Resp 18  SpO2 98% Physical Exam  Nursing note and vitals reviewed. Constitutional: He is oriented to person, place, and time. He appears well-developed and well-nourished. No distress.  HENT:  Head: Normocephalic and atraumatic.  Right Ear: Tympanic membrane and ear canal normal.  Left Ear: Tympanic membrane and ear canal normal.  Mouth/Throat: Uvula is midline, oropharynx is clear and moist and mucous membranes are normal. No oropharyngeal exudate.  Eyes: EOM are normal. Pupils are equal, round, and reactive to light. Right eye exhibits chemosis and discharge. Right eye exhibits no exudate and no hordeolum. No foreign body present in the right eye. Left eye exhibits chemosis, discharge and exudate. Left eye exhibits no hordeolum. No foreign body present in the left eye. Right conjunctiva is injected. Left conjunctiva is injected. No scleral icterus.  Neck: Normal range of motion, full passive range of motion without pain and phonation normal. Neck supple.  Cardiovascular: Normal rate, regular rhythm, normal heart sounds and intact distal pulses.   No murmur heard. Pulmonary/Chest: Effort normal. No stridor. No respiratory distress. He has wheezes. He has no rales. He exhibits no tenderness.  Coarse lungs sounds bilaterally with inspiratory wheezes, no rales   Musculoskeletal: Normal range of motion. He exhibits no edema.  Lymphadenopathy:    He has no cervical adenopathy.  Neurological: He is alert and oriented to person, place, and time. He exhibits normal muscle tone. Coordination normal.  Skin: Skin is warm and dry.    ED Course  Procedures (including critical care time) Labs Review Labs Reviewed  GLUCOSE, CAPILLARY - Abnormal; Notable for the following:    Glucose-Capillary 109 (*)    All other components within normal limits   Imaging Review No results found.  EKG Interpretation    None       MDM    Inspiratory wheezes throughout.  No crackles , stridor or tachypnea. VSS.  No LE edema , no concerning sx's to suggest CHF excerebration at this time.  URI sx's with conjunctivitis.  Likely bronchitis.  Will treat with z-pack and tobramycin ophth drops, albuterol inhaler dispensed.  Pt advised and agrees to return here for worsening sx's,    Jessicah Croll L. Trisha Mangle, PA-C 01/10/13 1155

## 2013-01-09 NOTE — ED Notes (Signed)
Pt c/o nonprod cough and bilateral "eye problem" x 2 days.sclera red bilateral. Nad. Denies any swelling. No resp distress or sob note.

## 2013-01-10 NOTE — ED Provider Notes (Signed)
Medical screening examination/treatment/procedure(s) were performed by non-physician practitioner and as supervising physician I was immediately available for consultation/collaboration.  EKG Interpretation   None         Benny Lennert, MD 01/10/13 [phone]

## 2013-02-17 ENCOUNTER — Emergency Department (HOSPITAL_COMMUNITY)
Admission: EM | Admit: 2013-02-17 | Discharge: 2013-02-17 | Disposition: A | Discharge status: Home / Self Care | Payer: 59 | Attending: Emergency Medicine | Admitting: Emergency Medicine

## 2013-02-17 ENCOUNTER — Encounter (HOSPITAL_COMMUNITY): Payer: Self-pay | Admitting: Emergency Medicine

## 2013-02-17 DIAGNOSIS — Z87891 Personal history of nicotine dependence: Secondary | ICD-10-CM | POA: Insufficient documentation

## 2013-02-17 DIAGNOSIS — E119 Type 2 diabetes mellitus without complications: Secondary | ICD-10-CM | POA: Insufficient documentation

## 2013-02-17 DIAGNOSIS — Z792 Long term (current) use of antibiotics: Secondary | ICD-10-CM | POA: Insufficient documentation

## 2013-02-17 DIAGNOSIS — G8929 Other chronic pain: Secondary | ICD-10-CM | POA: Insufficient documentation

## 2013-02-17 DIAGNOSIS — M545 Low back pain, unspecified: Secondary | ICD-10-CM | POA: Insufficient documentation

## 2013-02-17 DIAGNOSIS — M25562 Pain in left knee: Secondary | ICD-10-CM

## 2013-02-17 DIAGNOSIS — M25569 Pain in unspecified knee: Secondary | ICD-10-CM | POA: Insufficient documentation

## 2013-02-17 DIAGNOSIS — M25561 Pain in right knee: Secondary | ICD-10-CM

## 2013-02-17 DIAGNOSIS — I509 Heart failure, unspecified: Secondary | ICD-10-CM | POA: Insufficient documentation

## 2013-02-17 DIAGNOSIS — Z79899 Other long term (current) drug therapy: Secondary | ICD-10-CM | POA: Insufficient documentation

## 2013-02-17 MED ORDER — HYDROCODONE-ACETAMINOPHEN 10-325 MG PO TABS: 1.0000 | ORAL_TABLET | Freq: Four times a day (QID) | ORAL | Status: DC | PRN

## 2013-02-17 NOTE — ED Notes (Signed)
Pt states chronic bilateral knee pain and lower back pain. Pt states he was on hydrocone 10mg  and was switched to Tylenol #3 on the 20th. He states new medication is not relieving pain.

## 2013-02-17 NOTE — Discharge Instructions (Signed)
As discussed, it is important that you follow up as soon as possible with your physician for continued management of your condition.  If you develop any new, or concerning changes in your condition, please return to the emergency department immediately.  Cryotherapy Cryotherapy means treatment with cold. Ice or gel packs can be used to reduce both pain and swelling. Ice is the most helpful within the first 24 to 48 hours after an injury or flareup from overusing a muscle or joint. Sprains, strains, spasms, burning pain, shooting pain, and aches can all be eased with ice. Ice can also be used when recovering from surgery. Ice is effective, has very few side effects, and is safe for most people to use. PRECAUTIONS  Ice is not a safe treatment option for people with:  Raynaud's phenomenon. This is a condition affecting small blood vessels in the extremities. Exposure to cold may cause your problems to return.  Cold hypersensitivity. There are many forms of cold hypersensitivity, including:  Cold urticaria. Red, itchy hives appear on the skin when the tissues begin to warm after being iced.  Cold erythema. This is a red, itchy rash caused by exposure to cold.  Cold hemoglobinuria. Red blood cells break down when the tissues begin to warm after being iced. The hemoglobin that carry oxygen are passed into the urine because they cannot combine with blood proteins fast enough.  Numbness or altered sensitivity in the area being iced. If you have any of the following conditions, do not use ice until you have discussed cryotherapy with your caregiver:  Heart conditions, such as arrhythmia, angina, or chronic heart disease.  High blood pressure.  Healing wounds or open skin in the area being iced.  Current infections.  Rheumatoid arthritis.  Poor circulation.  Diabetes. Ice slows the blood flow in the region it is applied. This is beneficial when trying to stop inflamed tissues from spreading  irritating chemicals to surrounding tissues. However, if you expose your skin to cold temperatures for too long or without the proper protection, you can damage your skin or nerves. Watch for signs of skin damage due to cold. HOME CARE INSTRUCTIONS Follow these tips to use ice and cold packs safely.  Place a dry or damp towel between the ice and skin. A damp towel will cool the skin more quickly, so you may need to shorten the time that the ice is used.  For a more rapid response, add gentle compression to the ice.  Ice for no more than 10 to 20 minutes at a time. The bonier the area you are icing, the less time it will take to get the benefits of ice.  Check your skin after 5 minutes to make sure there are no signs of a poor response to cold or skin damage.  Rest 20 minutes or more in between uses.  Once your skin is numb, you can end your treatment. You can test numbness by very lightly touching your skin. The touch should be so light that you do not see the skin dimple from the pressure of your fingertip. When using ice, most people will feel these normal sensations in this order: cold, burning, aching, and numbness.  Do not use ice on someone who cannot communicate their responses to pain, such as small children or people with dementia. HOW TO MAKE AN ICE PACK Ice packs are the most common way to use ice therapy. Other methods include ice massage, ice baths, and cryo-sprays. Muscle creams that  cause a cold, tingly feeling do not offer the same benefits that ice offers and should not be used as a substitute unless recommended by your caregiver. To make an ice pack, do one of the following:  Place crushed ice or a bag of frozen vegetables in a sealable plastic bag. Squeeze out the excess air. Place this bag inside another plastic bag. Slide the bag into a pillowcase or place a damp towel between your skin and the bag.  Mix 3 parts water with 1 part rubbing alcohol. Freeze the mixture in a  sealable plastic bag. When you remove the mixture from the freezer, it will be slushy. Squeeze out the excess air. Place this bag inside another plastic bag. Slide the bag into a pillowcase or place a damp towel between your skin and the bag. SEEK MEDICAL CARE IF:  You develop white spots on your skin. This may give the skin a blotchy (mottled) appearance.  Your skin turns blue or pale.  Your skin becomes waxy or hard.  Your swelling gets worse. MAKE SURE YOU:   Understand these instructions.  Will watch your condition.  Will get help right away if you are not doing well or get worse. Document Released: 08/31/2010 Document Revised: 03/29/2011 Document Reviewed: 08/31/2010 [facility] Patient Information 2014 [place], [place].

## 2013-02-17 NOTE — ED Notes (Signed)
Pt alert & oriented x4, stable gait. Patient given discharge instructions, paperwork & prescription(s). Patient  instructed to stop at the registration desk to finish any additional paperwork. Patient verbalized understanding. Pt left department w/ no further questions. 

## 2013-02-17 NOTE — ED Provider Notes (Signed)
CSN: [id number]     Arrival date & time 02/17/13  6962 History  This chart was scribed for [name] Muskrat, MD by Maree Erie, ED Scribe. The patient was seen in room APA19/APA19. Patient's care was started at 9:42 AM.     Chief Complaint  Patient presents with  . Knee Pain   Patient is a 58 y.o. male presenting with knee pain. The history is provided by the patient. No language interpreter was used.  Knee Pain   HPI Comments: [name] is a 58 y.o. male who presents to the Emergency Department complaining of a recurrence of chronic low back and bilateral knee pain that began about ten days ago. He states that he was switched from his usual pain medication of Hydrocodone 10-325 to Tylenol-Codeine #3 by his PCP to try and wean off pain medication. He states his usual chronic pain appeared after switching to the new medication. He denies any new trauma, falls or injury to the area. He states prior imaging of the area came back negative for any acute findings. He denies new weakness or numbness, fever, chills, or bowel or bladder incontinence. He has a past medical history of DM and CHF that are well managed.   PCP is Dr. Everette Rank.   Past Medical History  Diagnosis Date  . Diabetes mellitus   . Fluid retention in legs   . CHF (congestive heart failure)    Past Surgical History  Procedure Laterality Date  . Hernia repair    . Colonoscopy  01/04/2012    Procedure: COLONOSCOPY;  Surgeon: Danie Binder, MD;  Location: [facility];  Service: Endoscopy;  Laterality: N/A;  1:30 PM   Family History  Problem Relation Age of Onset  . Heart attack Father   . Heart failure Father   . Cancer Mother    History  Substance Use Topics  . Smoking status: Former Smoker -- 0.50 packs/day  . Smokeless tobacco: Not on file     Comment: electronic cig for 4 months   . Alcohol Use: Yes     Comment: weekends    Review of Systems  Constitutional:       Per HPI, otherwise negative  HENT:        Per HPI, otherwise negative  Respiratory:       Per HPI, otherwise negative  Cardiovascular:       Per HPI, otherwise negative  Gastrointestinal: Negative for vomiting.  Endocrine:       Negative aside from HPI  Genitourinary:       Neg aside from HPI   Musculoskeletal:       Per HPI, otherwise negative  Skin: Negative.   Neurological: Negative for syncope.    Allergies  Review of patient's allergies indicates no known allergies.  Home Medications   Current Outpatient Rx  Name  Route  Sig  Dispense  Refill  . ALPRAZolam (XANAX) 1 MG tablet   Oral   Take 1 mg by mouth 3 (three) times daily as needed. For anxiety         . azithromycin (ZITHROMAX Z-PAK) 250 MG tablet      Take two tablets on day one, then one tab qd days 2-5   6 tablet   0   . carvedilol (COREG) 25 MG tablet   Oral   Take 1 tablet (25 mg total) by mouth 2 (two) times daily with a meal.   60 tablet   3   .  furosemide (LASIX) 20 MG tablet      TAKE 2  tab in the morning and 1 tab in the evening.   270 tablet   1   . HYDROcodone-acetaminophen (NORCO/VICODIN) 5-325 MG per tablet   Oral   Take 1 tablet by mouth 4 (four) times daily as needed for pain.          Marland Kitchen losartan (COZAAR) 50 MG tablet   Oral   Take 1 tablet (50 mg total) by mouth daily.   90 tablet   3   . metFORMIN (GLUCOPHAGE) 500 MG tablet   Oral   Take 500 mg by mouth 2 (two) times daily with a meal.          . naphazoline-glycerin (CLEAR EYES) 0.012-0.2 % SOLN   Both Eyes   Place 1-2 drops into both eyes every 8 (eight) hours as needed for irritation.         . simvastatin (ZOCOR) 40 MG tablet   Oral   Take 40 mg by mouth at bedtime.          Marland Kitchen spironolactone (ALDACTONE) 25 MG tablet   Oral   Take 0.5 tablets (12.5 mg total) by mouth daily.   30 tablet   6    Triage Vitals: BP 140/86  Pulse 87  Temp(Src) 97.8 F (36.6 C)  Resp 16  Ht 6' (1.829 m)  Wt 210 lb (95.255 kg)  BMI 28.47 kg/m2  SpO2  96%  Physical Exam  Nursing note and vitals reviewed. Constitutional: He is oriented to person, place, and time. He appears well-developed. No distress.  HENT:  Head: Normocephalic and atraumatic.  Eyes: Conjunctivae and EOM are normal.  Cardiovascular: Normal rate and regular rhythm.   Pulmonary/Chest: Effort normal. No stridor. No respiratory distress.  Abdominal: He exhibits no distension.  Musculoskeletal: He exhibits no edema.  No knee effusions, no sig ttp anywhere. Audible noise w flexion bilaterally. No instability. Distal LE WNL.  Neurological: He is alert and oriented to person, place, and time.  Skin: Skin is warm and dry.  Psychiatric: He has a normal mood and affect.    ED Course  Procedures (including critical care time)    COORDINATION OF CARE: 9:47 AM -Will order short course of medication and recommend patient follows up with Dr. Everette Rank early next week.  Patient verbalizes understanding and agrees with treatment plan.     Labs Review Labs Reviewed - No data to display Imaging Review No results found.  EKG Interpretation   None       MDM   I personally performed the services described in this documentation, which was scribed in my presence. The recorded information has been reviewed and is accurate.   This patient history of chronic musculoskeletal pain now presents with worsening pain in his legs.  Patient's neurovascular intact, with no description of concerning new developments, and on exam no findings concerning for occult systemic pathology.  Patient was discharged with analgesia, instructions to follow up with primary care for appropriate long-term management, including consideration of physical therapy.   [name] Muskrat, MD 02/17/13 [phone]

## 2013-03-09 ENCOUNTER — Ambulatory Visit (INDEPENDENT_AMBULATORY_CARE_PROVIDER_SITE_OTHER): Payer: 59 | Admitting: Cardiovascular Disease

## 2013-03-09 ENCOUNTER — Encounter: Payer: Self-pay | Admitting: Cardiovascular Disease

## 2013-03-09 ENCOUNTER — Emergency Department (HOSPITAL_COMMUNITY)
Admission: EM | Admit: 2013-03-09 | Discharge: 2013-03-09 | Disposition: A | Discharge status: Home / Self Care | Payer: 59 | Attending: Emergency Medicine | Admitting: Emergency Medicine

## 2013-03-09 ENCOUNTER — Encounter (INDEPENDENT_AMBULATORY_CARE_PROVIDER_SITE_OTHER): Payer: Self-pay

## 2013-03-09 ENCOUNTER — Encounter (HOSPITAL_COMMUNITY): Payer: Self-pay | Admitting: Emergency Medicine

## 2013-03-09 VITALS — BP 127/74 | HR 92 | Ht 72.0 in | Wt 215.5 lb

## 2013-03-09 DIAGNOSIS — G8929 Other chronic pain: Secondary | ICD-10-CM | POA: Insufficient documentation

## 2013-03-09 DIAGNOSIS — I509 Heart failure, unspecified: Secondary | ICD-10-CM | POA: Insufficient documentation

## 2013-03-09 DIAGNOSIS — E119 Type 2 diabetes mellitus without complications: Secondary | ICD-10-CM | POA: Insufficient documentation

## 2013-03-09 DIAGNOSIS — I5022 Chronic systolic (congestive) heart failure: Secondary | ICD-10-CM

## 2013-03-09 DIAGNOSIS — I10 Essential (primary) hypertension: Secondary | ICD-10-CM

## 2013-03-09 DIAGNOSIS — R0602 Shortness of breath: Secondary | ICD-10-CM | POA: Insufficient documentation

## 2013-03-09 DIAGNOSIS — M545 Low back pain, unspecified: Secondary | ICD-10-CM | POA: Insufficient documentation

## 2013-03-09 DIAGNOSIS — Z79899 Other long term (current) drug therapy: Secondary | ICD-10-CM | POA: Insufficient documentation

## 2013-03-09 DIAGNOSIS — Z87891 Personal history of nicotine dependence: Secondary | ICD-10-CM | POA: Insufficient documentation

## 2013-03-09 DIAGNOSIS — F1920 Other psychoactive substance dependence, uncomplicated: Secondary | ICD-10-CM

## 2013-03-09 DIAGNOSIS — Z760 Encounter for issue of repeat prescription: Secondary | ICD-10-CM | POA: Insufficient documentation

## 2013-03-09 LAB — CBG MONITORING, ED: Glucose-Capillary: 205 mg/dL — ABNORMAL HIGH (ref 70–99)

## 2013-03-09 MED ORDER — HYDROCODONE-ACETAMINOPHEN 5-325 MG PO TABS
2.0000 | ORAL_TABLET | Freq: Once | ORAL | Status: AC
  Administered 2013-03-09: 2 via ORAL
  Filled 2013-03-09: qty 2

## 2013-03-09 NOTE — ED Notes (Signed)
Pt states ran out of hydrocodone for his chronic lower back and bilateral knee pain x 4 days ago. pcp appt in March. Nad.

## 2013-03-09 NOTE — Progress Notes (Signed)
Patient ID: [name] [name], male   DOB: 04/18/1955, 58 y.o.   MRN: [id number]      SUBJECTIVE: The patient is a 58 year old male with a history of a nonischemic cardiomyopathy and chronic systolic heart failure. His most recent ejection fraction was assessed at 20-25%. Coronary angiography revealed normal coronary anatomy. He is seeing Dr. Lovena Le in the past for ICD consideration. A decision regarding this was to be made after a repeat echocardiogram, which is to be performed next month. He denies leg swelling. He does complain of arthritis in his right knee as well as his back. He sleeps with 2 pillows but says he has done this for a long time. He denies paroxysmal nocturnal dyspnea. If he stands up too quickly or turns his head too quickly he may get dizzy, but denies syncope and also denies palpitations. He requests pain medications for arthritis. He now smokes electronic cigarettes.  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  No Known Allergies  Current Outpatient Prescriptions  Medication Sig Dispense Refill  . ALPRAZolam (XANAX) 1 MG tablet Take 1 mg by mouth 3 (three) times daily as needed. For anxiety      . azithromycin (ZITHROMAX Z-PAK) 250 MG tablet Take two tablets on day one, then one tab qd days 2-5  6 tablet  0  . carvedilol (COREG) 25 MG tablet Take 1 tablet (25 mg total) by mouth 2 (two) times daily with a meal.  60 tablet  3  . furosemide (LASIX) 20 MG tablet TAKE 2  tab in the morning and 1 tab in the evening.  270 tablet  1  . HYDROcodone-acetaminophen (NORCO) 10-325 MG per tablet Take 1 tablet by mouth every 6 (six) hours as needed.  20 tablet  0  . losartan (COZAAR) 50 MG tablet Take 1 tablet (50 mg total) by mouth daily.  90 tablet  3  . metFORMIN (GLUCOPHAGE) 500 MG tablet Take 500 mg by mouth 2 (two) times daily with a meal.       . naphazoline-glycerin (CLEAR EYES) 0.012-0.2 % SOLN Place 1-2 drops into  both eyes every 8 (eight) hours as needed for irritation.      . simvastatin (ZOCOR) 40 MG tablet Take 40 mg by mouth at bedtime.       Marland Kitchen spironolactone (ALDACTONE) 25 MG tablet Take 0.5 tablets (12.5 mg total) by mouth daily.  30 tablet  6   No current facility-administered medications for this visit.    Past Medical History  Diagnosis Date  . Diabetes mellitus   . Fluid retention in legs   . CHF (congestive heart failure)     Past Surgical History  Procedure Laterality Date  . Hernia repair    . Colonoscopy  01/04/2012    Procedure: COLONOSCOPY;  Surgeon: Danie Binder, MD;  Location: [facility];  Service: Endoscopy;  Laterality: N/A;  1:30 PM    History   Social History  . Marital Status: Married    Spouse Name: N/A    Number of Children: N/A  . Years of Education: N/A   Occupational History  . Not on file.   Social History Main Topics  . Smoking status: Former Smoker -- 0.50 packs/day  . Smokeless tobacco: Not on file     Comment: electronic cig for 4 months   . Alcohol Use: Yes     Comment: weekends  . Drug Use: No  .  Sexual Activity: Not on file   Other Topics Concern  . Not on file   Social History Narrative  . No narrative on file    BP 127/74 Pulse 92    PHYSICAL EXAM General: NAD Neck: No JVD, no thyromegaly or thyroid nodule.  Lungs: Intermittent, faint expiratory wheezes bilaterally with normal respiratory effort. CV: Nondisplaced PMI.  Heart regular S1/S2, no S3/S4, no murmur.  No peripheral edema.  No carotid bruit.  Normal pedal pulses.  Abdomen: Soft, nontender, no hepatosplenomegaly, no distention.  Neurologic: Alert and oriented x 3.  Psych: Normal affect. Extremities: No clubbing or cyanosis.   ECG: reviewed and available in electronic records. (Normal sinus rhythm with right bundle branch block and left anterior fascicular block).   Diagnostic Studies   09/08/12 Echo: LVEF 20-25%, moderate basal septal hypertrophy, diffuse  global hypokinesis, grade II diastolic dysfunction, mod LAE, mod RV dysfunction,   07/28/2012 Echo: LVEF 20-25%  09/2011 Echo: LVEF 40%  06/2011 Echo: LVEF 15%   06/2011 RHC and LHC  Procedural Findings:  Hemodynamics  RA 6/4with a mean of 2 mmHg  RV 20/1 mmHg  PA 31/13 with a mean of 21 mmHg  PCWP 11/9 with a mean of 6 mm mercury  LV 104/4 mmHg  AO 98/72 with a mean of 84 mmHg  Oxygen saturations:  PA 62%  AO 93%  Cardiac Output (Fick) 4.42 L per minute  Cardiac Index (Fick) 2.1 L per minute per meter square  Coronary angiography:  Coronary dominance: right  Left mainstem: Normal  Left anterior descending (LAD): Normal  Left circumflex (LCx): Normal  Right coronary artery (RCA): Normal  Left ventriculography: Limited injection demonstrates left ventricular enlargement with severe global hypokinesis. Ejection fraction is estimated at 15-20%.   Final Conclusions:  1. Normal coronary anatomy.  2. Severe left ventricular dysfunction.  3. Normal right heart pressures.       ASSESSMENT AND PLAN: 1. Chronic systolic heart failure: Appears to be compensated with NYHA class II symptoms currently. Continue carvedilol, Lasix, spironolactone, and ARB at present doses. 2. HTN: Controlled on present therapy. 3. Deferred pain medication prescription to primary care physician.  Dispo: repeat echo next month and f/u with Dr. Lovena Le for ICD consideration. Follow up with me in 6 months.  Kate Sable, M.D., F.A.C.C.

## 2013-03-09 NOTE — Patient Instructions (Addendum)
Your physician wants you to follow-up in: 6 months You will receive a reminder letter in the mail two months in advance. If you don't receive a letter, please call our office to schedule the follow-up appointment.   Your physician recommends that you continue on your current medications as directed. Please refer to the Current Medication list given to you today.    You will have an upcoming apt with Dr.Taylor.You also need an echocardiogram,our front office staff can schedule that apt for you   Thanks for choosing [facility] !

## 2013-03-09 NOTE — Discharge Instructions (Signed)
your vital signs are within normal limits. No acute problems noted on your examination at this time. Please see your primary care physician for prescription refill and pain management. The emergency department is not set up for pain management issues.

## 2013-03-09 NOTE — ED Provider Notes (Signed)
History/physical exam/procedure(s) were performed by non-physician practitioner and as supervising physician I was immediately available for consultation/collaboration. I have reviewed all notes and am in agreement with care and plan.   Shaune Pollack, MD 03/09/13 [phone]

## 2013-03-09 NOTE — ED Provider Notes (Signed)
CSN: [id number]     Arrival date & time 03/09/13  1651 History   First MD Initiated Contact with Patient 03/09/13 1820     Chief Complaint  Patient presents with  . Knee Pain  . Back Pain     (Consider location/radiation/quality/duration/timing/severity/associated sxs/prior Treatment) HPI Comments: Patient is a 58 year old male who presents to the emergency department with complaint of being out of his hydrocodone for his chronic lower back and knee pain. The patient states that he has chronic lower back and bilateral knee pain. He's been out of his medications over the past 4 days. The patient states he has an appointment in March of this year. He presents now requesting pain medication until he can see his primary physician. There has been no new injury. Patient has not had any recent operations or procedures. It is of note that the patient was seen on January 31 with a very similar situation. He states he has taken Tylenol but this is not helping.  Patient is a 58 y.o. male presenting with knee pain and back pain. The history is provided by the patient.  Knee Pain Associated symptoms: back pain   Associated symptoms: no neck pain   Back Pain Associated symptoms: no abdominal pain, no chest pain and no dysuria     Past Medical History  Diagnosis Date  . Diabetes mellitus   . Fluid retention in legs   . CHF (congestive heart failure)    Past Surgical History  Procedure Laterality Date  . Hernia repair    . Colonoscopy  01/04/2012    Procedure: COLONOSCOPY;  Surgeon: Danie Binder, MD;  Location: [facility];  Service: Endoscopy;  Laterality: N/A;  1:30 PM   Family History  Problem Relation Age of Onset  . Heart attack Father   . Heart failure Father   . Cancer Mother    History  Substance Use Topics  . Smoking status: Former Smoker -- 0.50 packs/day  . Smokeless tobacco: Not on file     Comment: electronic cig for 4 months   . Alcohol Use: Yes     Comment: weekends     Review of Systems  Constitutional: Negative for activity change.       All ROS Neg except as noted in HPI  HENT: Negative for nosebleeds.   Eyes: Negative for photophobia and discharge.  Respiratory: Positive for shortness of breath. Negative for cough and wheezing.   Cardiovascular: Negative for chest pain and palpitations.  Gastrointestinal: Negative for abdominal pain and blood in stool.  Genitourinary: Negative for dysuria, frequency and hematuria.  Musculoskeletal: Positive for arthralgias and back pain. Negative for neck pain.  Skin: Negative.   Neurological: Negative for dizziness, seizures and speech difficulty.  Psychiatric/Behavioral: Negative for hallucinations and confusion.      Allergies  Review of patient's allergies indicates no known allergies.  Home Medications   Current Outpatient Rx  Name  Route  Sig  Dispense  Refill  . acetaminophen-codeine (TYLENOL #3) 300-30 MG per tablet               . ALPRAZolam (XANAX) 1 MG tablet   Oral   Take 1 mg by mouth 3 (three) times daily as needed. For anxiety         . carvedilol (COREG) 25 MG tablet   Oral   Take 1 tablet (25 mg total) by mouth 2 (two) times daily with a meal.   60 tablet   3   .  furosemide (LASIX) 20 MG tablet      TAKE 2  tab in the morning and 1 tab in the evening.   270 tablet   1   . HYDROcodone-acetaminophen (NORCO) 10-325 MG per tablet   Oral   Take 1 tablet by mouth every 6 (six) hours as needed.   20 tablet   0   . losartan (COZAAR) 50 MG tablet   Oral   Take 1 tablet (50 mg total) by mouth daily.   90 tablet   3   . metFORMIN (GLUCOPHAGE) 500 MG tablet   Oral   Take 500 mg by mouth 2 (two) times daily with a meal.          . simvastatin (ZOCOR) 40 MG tablet   Oral   Take 40 mg by mouth at bedtime.          Marland Kitchen spironolactone (ALDACTONE) 25 MG tablet   Oral   Take 0.5 tablets (12.5 mg total) by mouth daily.   30 tablet   6   . UNISTRIP1 GENERIC test  strip                BP 129/72  Pulse 75  Temp(Src) 98.3 F (36.8 C) (Oral)  Resp 19  SpO2 8% Physical Exam  Nursing note and vitals reviewed. Constitutional: He is oriented to person, place, and time. He appears well-developed and well-nourished.  Non-toxic appearance.  HENT:  Head: Normocephalic.  Right Ear: Tympanic membrane and external ear normal.  Left Ear: Tympanic membrane and external ear normal.  Smell of alcohol on the breath.  Eyes: EOM and lids are normal. Pupils are equal, round, and reactive to light.  Neck: Normal range of motion. Neck supple. Carotid bruit is not present.  Cardiovascular: Normal rate, regular rhythm, normal heart sounds, intact distal pulses and normal pulses.   Pulmonary/Chest: Breath sounds normal. No respiratory distress.  Abdominal: Soft. Bowel sounds are normal. There is no tenderness. There is no guarding.  Musculoskeletal: Normal range of motion.  There is pain with range of motion of the lower back area. There is pain with change of position. There is crepitus with range of motion of the knee, right greater than left. There no hot joints appreciated.  Lymphadenopathy:       Head (right side): No submandibular adenopathy present.       Head (left side): No submandibular adenopathy present.    He has no cervical adenopathy.  Neurological: He is alert and oriented to person, place, and time. He has normal strength. No cranial nerve deficit or sensory deficit.  Skin: Skin is warm and dry.  Psychiatric: He has a normal mood and affect. His speech is normal.    ED Course  Procedures (including critical care time) Labs Review Labs Reviewed  CBG MONITORING, ED - Abnormal; Notable for the following:    Glucose-Capillary 205 (*)    All other components within normal limits   Imaging Review No results found.  EKG Interpretation   None       MDM Patient has chronic back and knee pain. He is in the midst of changing positions, and  cannot see the new physician until March of this year. The patient presents for prescription refill an assistance with his pain management. I have reviewed the previous emergency department records, and the patient had a similar situation on January 31.  I have examined the patient in no acute or emergent changes at this time.  I've  instructed the patient that he will need to see his new physician, or his previous physician for assistance with his pain management. I've instructed the patient that emergency department is not set up for pain management issues.    Final diagnoses:  None    **I have reviewed nursing notes, vital signs, and all appropriate lab and imaging results for this patient.Lenox Ahr, PA-C 03/09/13 [phone]

## 2013-03-09 NOTE — ED Notes (Signed)
bil knee pain and low back pain , chronic, Says he is out of his pain meds. And wants a refill.  No recent injury

## 2013-03-12 ENCOUNTER — Telehealth: Payer: Self-pay | Admitting: *Deleted

## 2013-03-12 NOTE — Telephone Encounter (Signed)
(  Late entry)Pt called several times on 03/09/13 leaving messages requesting a refill on Hydrocodone 10-325 mg. He has an appointment to establish care on 04/11/13 @ 10:00 am. Pt asked if we could review medical records to give medication due to severe pain. This RMA explain he had to be an patient in order to get Rx.

## 2013-03-15 ENCOUNTER — Inpatient Hospital Stay (HOSPITAL_COMMUNITY): Admission: RE | Admit: 2013-03-15 | Payer: 59 | Source: Ambulatory Visit

## 2013-03-15 ENCOUNTER — Ambulatory Visit (HOSPITAL_COMMUNITY)
Admission: RE | Admit: 2013-03-15 | Discharge: 2013-03-15 | Disposition: A | Discharge status: Home / Self Care | Payer: 59 | Source: Ambulatory Visit | Attending: Internal Medicine | Admitting: Internal Medicine

## 2013-03-15 DIAGNOSIS — Z6829 Body mass index (BMI) 29.0-29.9, adult: Secondary | ICD-10-CM | POA: Insufficient documentation

## 2013-03-15 DIAGNOSIS — E119 Type 2 diabetes mellitus without complications: Secondary | ICD-10-CM | POA: Insufficient documentation

## 2013-03-15 DIAGNOSIS — I10 Essential (primary) hypertension: Secondary | ICD-10-CM | POA: Insufficient documentation

## 2013-03-15 DIAGNOSIS — I517 Cardiomegaly: Secondary | ICD-10-CM

## 2013-03-15 DIAGNOSIS — I509 Heart failure, unspecified: Secondary | ICD-10-CM | POA: Insufficient documentation

## 2013-03-15 DIAGNOSIS — F17200 Nicotine dependence, unspecified, uncomplicated: Secondary | ICD-10-CM | POA: Insufficient documentation

## 2013-03-15 DIAGNOSIS — I428 Other cardiomyopathies: Secondary | ICD-10-CM | POA: Insufficient documentation

## 2013-03-15 NOTE — Progress Notes (Signed)
*  PRELIMINARY RESULTS* Echocardiogram 2D Echocardiogram has been performed.  [place], [place] 03/15/2013, 10:59 AM

## 2013-03-20 ENCOUNTER — Telehealth: Payer: Self-pay | Admitting: Cardiovascular Disease

## 2013-03-20 NOTE — Telephone Encounter (Signed)
Patient requests echocardiogram results         Will route to Dr.Taylor for response

## 2013-03-20 NOTE — Telephone Encounter (Signed)
Results of 2 D Echo / tgs  °

## 2013-03-21 ENCOUNTER — Telehealth: Payer: Self-pay | Admitting: *Deleted

## 2013-03-21 NOTE — Telephone Encounter (Signed)
Made pt aware of Echo results. Pt is also aware of Dr Tanna Furry appt on 04-16-13

## 2013-03-21 NOTE — Telephone Encounter (Signed)
Message copied by Truett Mainland on Wed Mar 21, 2013  2:34 PM ------      Message from: [place] [facility]      Created: Wed Mar 21, 2013  2:08 PM      Regarding: RE: echo results       Please let patient know that Dr Raliegh Ip is out. His echo shows that heart functioning continues to be weak, and overall unchanged from prior studies. This will be further addressed when he sees Dr Lovena Le later this month.            Carlyle Dolly MD      ----- Message -----         From: Bernita Raisin, RN         Sent: 03/20/2013  11:34 AM           To: Herminio Commons, MD      Subject: echo results                                             Pt calling for echo results       ------

## 2013-04-09 ENCOUNTER — Other Ambulatory Visit: Payer: Self-pay | Admitting: Adult Health

## 2013-04-10 ENCOUNTER — Ambulatory Visit: Payer: Self-pay | Admitting: Family Medicine

## 2013-04-16 ENCOUNTER — Ambulatory Visit: Payer: 59 | Admitting: Internal Medicine

## 2013-04-16 ENCOUNTER — Encounter: Payer: Self-pay | Admitting: Internal Medicine

## 2013-05-10 ENCOUNTER — Ambulatory Visit: Payer: 59 | Admitting: Internal Medicine

## 2013-05-21 ENCOUNTER — Ambulatory Visit: Payer: 59 | Admitting: Internal Medicine

## 2013-05-21 ENCOUNTER — Encounter: Payer: Self-pay | Admitting: Internal Medicine

## 2013-06-06 ENCOUNTER — Ambulatory Visit: Payer: 59 | Admitting: *Deleted

## 2013-06-15 NOTE — Progress Notes (Signed)
This encounter was created in error - please disregard.

## 2013-06-20 ENCOUNTER — Ambulatory Visit (INDEPENDENT_AMBULATORY_CARE_PROVIDER_SITE_OTHER): Payer: 59 | Admitting: Medical

## 2013-06-20 ENCOUNTER — Encounter: Payer: Self-pay | Admitting: Medical

## 2013-06-20 ENCOUNTER — Telehealth: Payer: Self-pay | Admitting: Family Medicine

## 2013-06-20 VITALS — BP 120/80 | HR 60 | Temp 98.1°F | Resp 14 | Wt 220.0 lb

## 2013-06-20 DIAGNOSIS — G8929 Other chronic pain: Secondary | ICD-10-CM

## 2013-06-20 DIAGNOSIS — F411 Generalized anxiety disorder: Secondary | ICD-10-CM

## 2013-06-20 DIAGNOSIS — M25569 Pain in unspecified knee: Secondary | ICD-10-CM

## 2013-06-20 DIAGNOSIS — I5032 Chronic diastolic (congestive) heart failure: Secondary | ICD-10-CM

## 2013-06-20 DIAGNOSIS — M549 Dorsalgia, unspecified: Secondary | ICD-10-CM

## 2013-06-20 DIAGNOSIS — I519 Heart disease, unspecified: Secondary | ICD-10-CM

## 2013-06-20 DIAGNOSIS — I10 Essential (primary) hypertension: Secondary | ICD-10-CM

## 2013-06-20 DIAGNOSIS — E119 Type 2 diabetes mellitus without complications: Secondary | ICD-10-CM

## 2013-06-20 DIAGNOSIS — M25559 Pain in unspecified hip: Secondary | ICD-10-CM

## 2013-06-20 MED ORDER — SPIRONOLACTONE 25 MG PO TABS: 12.5000 mg | ORAL_TABLET | Freq: Every day | ORAL | Status: DC

## 2013-06-20 MED ORDER — SIMVASTATIN 40 MG PO TABS: 40.0000 mg | ORAL_TABLET | Freq: Every day | ORAL | Status: DC

## 2013-06-20 MED ORDER — LOSARTAN POTASSIUM 50 MG PO TABS: 80.0000 mg | ORAL_TABLET | Freq: Every day | ORAL | Status: DC

## 2013-06-20 MED ORDER — HYDROCODONE-ACETAMINOPHEN 10-325 MG PO TABS: 1.0000 | ORAL_TABLET | Freq: Four times a day (QID) | ORAL | Status: DC | PRN

## 2013-06-20 MED ORDER — FUROSEMIDE 20 MG PO TABS: 20.0000 mg | ORAL_TABLET | Freq: Two times a day (BID) | ORAL | Status: DC

## 2013-06-20 MED ORDER — ALPRAZOLAM 1 MG PO TABS: 1.0000 mg | ORAL_TABLET | Freq: Three times a day (TID) | ORAL | Status: DC | PRN

## 2013-06-20 NOTE — Telephone Encounter (Signed)
Patient has an appointment to see Dr. Rhona Raider on 06/22/13 @ 845 am at [place]. CLS I fax over his OV note. CLS

## 2013-06-20 NOTE — Patient Instructions (Signed)
Thank you for giving me the opportunity to serve you today.    Your diagnosis today includes: Encounter Diagnoses  Name Primary?  . Chronic back pain Yes  . Generalized anxiety disorder   . Chronic knee pain   . Chronic hip pain   . Heart disease, unspecified   . Type II or unspecified type diabetes mellitus without mention of complication, not stated as uncontrolled   . Chronic diastolic heart failure   . Unspecified essential hypertension      Specific recommendations today include:  I will need you to make an appointment with a psychiatrist to establish care for anxiety  You'll have to make this appointment on her own, and it needs to be within the next 30 days  I prescribed a short-term supply of Xanax and I recommend you wean down on this so you don't run out  Likewise, I refilled a short-term supply of her pain medication today, and I recommend you wean down this to not run out of this  We will refer you to [facility] orthopedics to further evaluate your back and hip as well  I will plan to see you back in one month for routine care on diabetes and blood pressure  Return pending call backs.    I have included other useful information below for your review.   RESOURCES in [place], [place]  If you are experiencing a mental health crisis or an emergency, please call 911 or go to the nearest emergency department.  [facility]   [facility]  [phone] [facility]   [phone]  Suicide Hotline 1-800-Suicide (212)299-8113)  National Suicide Prevention Lifeline (410) 655-0925  9345726271)  Domestic Violence, Rape/Crisis - [facility] [place] 251-831-3656  The [employer] Violence Hotline 1-800-799-SAFE 620-456-4586)  To report Child or Elder Abuse, please call: [place]  [phone] [facility] Department  [place]  [phone]  Teen Crisis line [phone] or [phone]     Psychiatry and Counseling services   Dr. Sheralyn Boatman, psychiatry [phone] office [email or web address] [address] [place], [place], [place] [address] Dr. Sheralyn Boatman Gala Murdoch, NP Leata Mouse, NP  Anxiety, Depression, ADHD, OCD, Eating Disorders, Bipolar, other   [facility] [phone] office [email or web address] [place], [place] [address]  Dr. Brayton Caves, psychiatry services  Dr. Mosie Epstein  Depression, Anxiety, Substance Abuse, Couples Issues, Adolescent Issues Janeth Rase, NP Depression, Anxiety, ADHD, Women's issues, Bipolar Disorder, Substance   Abuse Lajuana Ripple, NP Depression, Anxiety, Aging, ADHD, Bipolar Disorder, Substance Abuse Thomasene Lot, NP  Mood disturbances, ADHD, children, adolescents, adults Camelia Phenes, Therapist Sexual Addiction, Bipolar, Depression, Anxiety, Substance Addiction Marita Snellen, Therapist Grief and Loss, Anxiety, Depression, Bipolar, Medical Challenges, Life    Transitions Chase Picket, Therapist Substance Abuse, Relationships, Clergy Families, Anger and Stress Management, Postpartum Depression, Pre-Marital Counseling Chari Manning, Therapist [place], Anxiety, Depression, ADHD, Adjustment Disorder, PTSD, Grief and Loss, Divorce, Adoption Concerns   [facility] [phone] office [email or web address] [address], [place], [place], [place] [address]  Toy Cookey, MA, clinical psychologist  Cognitive-Behavior Therapy; Mood Disorders; Anxiety Disorders; adult and child ADHD; Family Therapy; Stress Management; personal growth, and Marital Therapy.    Terrance Mass Ph.D., clinical psychologist Cognitive-Behavior Therapy; Mood Disorders; Anxiety Disorders; Stress     Management   Karin Golden Ph.D., clinical  psychologist [phone] office [place], [place] [address] Cognitive Behavior Therapy, Depression, Bipolar, Anxiety, Grief and Loss  [facility] [place] office [place] [address] [place], [place] [address] Crisis services, Family support, in home therapy, treatment for Anxiety, PTSD, Sexual Assault, Substance Abuse, Financial/Credit Counseling, Variety of other services    [facility] [email or web address] [phone] office [place], [place] [address]  Larose Kells, Ph.D., [facility] Family, Couples, Anxiety, Depression, ADHD, Abuse, Anger Management Jonelle Sports, M.Ed., LPC Couples, Sexual orientation, Domestic violence, Child Abuse, Major Life Change,  Depression Clotilde Dieter, [place] Marriage counseling, Women's Issues, Depression, Intimacy, Career Issues Lennart Pall, Ph.D.,  LPC PTSD, Addictions, Grief, Anxiety, Sexual Orientation Krystal Eaton, [facility] Teen and child depression, anxiety, parenting challenges, Adult depression, self injury, relationship issues. Urbano Heir, [facility] Addiction, PTSD, Eating Disorders, Depression, Sexual Orientation Merilyn Baba, [facility] Eating disorder, Anxiety and Depression, Grief, Divorce, Couples and Family Counseling, Parenting   Dr. Norma Fredrickson, psychiatry [phone] office [address], [place], [place] [address]  Geriatric psychiatry services   [facility] [phone] office, 24x7 help line [email or web address] [address] [place], [place] [address] Substance Abuse, Depression, Anxiety, Mood Disorders, other Addictions, DWI Assessment/Treatment, Teen Issues, ADHD, Family Therapy Dr. Elane Fritz, Psychiatry services   Trude Mcburney, Therapist Initial assessments, Clinical Director, Substance Abuse counseling, DWI and DMV assessments, individual and group counseling Pennelope Bracken,  Therapist Depression, Anxiety, Dysfunctional families, Individual and Couples Counseling, Addiction, Sexual Abuse, Childhood Trauma, Spiritually Based Counseling Robin Ringer, Therapist [facility], Children and Adult Individual Counseling, Depression, Anxiety, Mood Disorders Godfrey Pick, Therapist Ages 5 and up, individual, couple and family therapy, family concerns, ADHD, Mood disorders, Grief, Substance Abuse Gena Fray, Therapist Male patients only - Mood disorders, Depression, Anxiety, PTSD, Gried,   Abuse, Relationships   Dr. Chucky May, psychiatry [phone] office [place], [place], [place] [address]

## 2013-06-20 NOTE — Progress Notes (Signed)
Subjective:   [name] [name] is a 58 y.o. male presenting on 06/20/2013 with chronic back and bilateral knee pain  New patient to establish care today.  Lives in [place], was seeing primary care in [place] prior, Dr. Emilee Hero, and saw Dr. Sheryn Bison once for same pain issues.   Dr. Emilee Hero stopped.  For last 8 months has had worsening pains in back, knees, and hips.     Has had back problems for 4-5 years.  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  Was working as an Administrator.  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  Since then has been out on short term disability, and currently feels like he can't work due to his multiple pains . Primarily been seeing Dr. Emilee Hero for this.  Had back xrays about a year ago.  He denies PT or chiropractor in recent years.   Was not referred to back specialist or pain specialist.  Dr. Emilee Hero was primarily treating with medication and home exercises.   No prior back surgery.  He reports bilat knee pains and hip pains over same period of time as the back.   He went to [facility] 2 wk for knee pains.  Up until that point had been treated by [facility] in [place] with pain control.   recently had to steroid shots in both knees on the same day.   Prior to this was medication management.  No prior knee surgery.  Constant pain if walking prolonged.  Unable to stand for just minutes without pain.   Has to stop and rest after 10-15 feet due to pain in hips, knees and back.   In general on medication for diabetes, high blood pressure, has a heart condition.  Cardiologist - [facility], sees multiple providers there.  Press refills on his medications today as he is completely out of Xanax and hydrocodone.  States he was getting 180 of each xanax and hydrocodone per month.  Hx/o anxiety, he states no prior SSRI.  Had been treated primarily by his prior PCM, Dr. Emilee Hero.   No  prior psychiatry eval.  No other aggravating or relieving factors.  No other complaint.  Review of Systems ROS as in subjective      Objective:    BP 120/80  Pulse 60  Temp(Src) 98.1 F (36.7 C) (Oral)  Resp 14  Wt 220 lb (99.791 kg)  General appearance: alert, no distress, WD/WN, AA male Neck: supple, no lymphadenopathy, no thyromegaly, no masses Heart: RRR, normal S1, S2, no murmurs Lungs: CTA bilaterally, no wheezes, rhonchi, or rales Abdomen: +bs, soft, non tender, non distended, no masses, no hepatomegaly, no splenomegaly Back: nontender to palpation, mild pain with flexion and extension, possible mild thoracolumbar scoliosis MSK: mild pain and mild reduced ROM bilat hips, bilat knees with creptius and popping with flexion, seems to have some slight laxity of both knees with lachmans, pain with knee ROM bilat, lower legs unremarkable Neuro: normal LE strength, sensation, DTRs, normal heel and toe walk, -SLR Ext: no edema, no cyanosis Pulses: 2+ symmetric, upper and lower extremities, normal cap refill      Assessment: Encounter Diagnoses  Name Primary?  . Chronic back pain Yes  . Generalized anxiety disorder   . Chronic knee pain   . Chronic hip pain   . Heart disease, unspecified   . Type II or unspecified type diabetes mellitus without mention of complication,  not stated as uncontrolled      Plan: Discussed case with supervising physician Dr. Redmond School.  Reviewed controlled substance reporting system info.  From 12/2012 until now has gotten consistent xanax and hydrocodone refills from Dr. Laverta Baltimore in [place] other than 1 lone script for Dr. Sheryn Bison, and 1 other small quantity of hydrocodone likely from the ED.    We discussed his concerns.  Chronic back pain, Chronic knee and hip pain - advised that we don't make a determination of disability.  Advised that we don't take over narcotic chronic medications.  Advised that he really needs to have additional  eval including imaging, other treatment offered and discussed for chronic back pain.  Similar, needs further eval on hip pains.  We will refer back to [facility].    Anxiety - no prior SSRI or psych eval.  Advised that I was not comfortable with the quality of xanax he is taking, and advised that he really needs formal psychiatry eval.  advised he establish with psychiatry.       He is followed by cardiology.  Advised that we can see him for routine f/u on diabetes and BP medications.  I called and spoke to his prior PCM, and they didn't discharge him, but he left upset that they would no longer prescribe his pain medications.    After speaking with Dr. Redmond School, I will give him short term supply of his medications, but will make appropriate referral.  He understands this plan.     Jarrin was seen today for chronic back and bilateral knee pain.  Diagnoses and associated orders for this visit:  Chronic back pain  Generalized anxiety disorder  Chronic knee pain  Chronic hip pain  Heart disease, unspecified  Type II or unspecified type diabetes mellitus without mention of complication, not stated as uncontrolled    Return pending call backs.

## 2013-07-09 ENCOUNTER — Telehealth: Payer: Self-pay | Admitting: Family Medicine

## 2013-07-09 NOTE — Telephone Encounter (Signed)
Patient had a Ortho appointment to see Dr. Rhona Raider on 06/22/13 @ 845 am. CLS

## 2013-07-09 NOTE — Telephone Encounter (Signed)
Pt called for refill of Hydrocodone and Xanax.  I advised pt if this is refilled he will need to pick up Hydrocodone rx and he said ok.  Xanax to go to [facility] in [place].  Pt Ph [phone]

## 2013-07-09 NOTE — Telephone Encounter (Signed)
pls see plan from last visit.  We should have referred him to ortho and he was suppose to schedule appt with psychiatrist.    When was his ortho appt, when is his psych appt, who/what time and date?  Also due back at this time for routine diabetes f/u.

## 2013-07-10 NOTE — Telephone Encounter (Signed)
LMOM TO CALL BACK. CLS

## 2013-07-11 NOTE — Telephone Encounter (Signed)
Patient states that he is currently taking taking 4 to 6 Xanax a day. I informed Dorothea Ogle PAC of this and he states that the patient will have to seek counseling first of all, make a follow up appointment here and he will only give him # 60 meaning 2 tablets a day if he will follow the above things then he can work with him. If he feels like he needs more Xanax a day, then he will need to see a psych. If he doesn't want to do what Dorothea Ogle [facility] recommends then he can't work with him and he will need to seek another doctor to refill his medications. I also explain to him that if he comes in for the [place] that they will need to discuss other medications to help with his anxiety. Patient states that he doesn't want to make an appointment because he needs his medications right now. CLS

## 2013-07-11 NOTE — Telephone Encounter (Signed)
LMOM TO CB. CLS 

## 2013-07-11 NOTE — Telephone Encounter (Signed)
Patient did see the Ortho. Doctor but he did not follow up with counselor. CLS He states that he has disability.

## 2013-07-11 NOTE — Telephone Encounter (Signed)
At his last visit I advised that I was not comfortable with either of the controlled medications, Hydrocodone and Xanax.  He told me he was getting #180 Xanax at a time.   Verify, was he taking 60/mo = BID dosing, or was he actually getting 180 per month which would be 5-6 tablets daily?    If he was really taking only BID dosing, and if willing to work with me and begin other medication to help with anxiety along with referral to counseling, then I can work with him.   If not, he will have to establish with psych.

## 2013-07-22 ENCOUNTER — Other Ambulatory Visit: Payer: Self-pay | Admitting: Adult Health

## 2013-07-25 ENCOUNTER — Ambulatory Visit: Payer: 59 | Admitting: *Deleted

## 2013-07-27 ENCOUNTER — Other Ambulatory Visit: Payer: Self-pay | Admitting: Family Medicine

## 2013-07-27 ENCOUNTER — Telehealth: Payer: Self-pay | Admitting: Medical

## 2013-07-27 MED ORDER — METFORMIN HCL 500 MG PO TABS: 500.0000 mg | ORAL_TABLET | Freq: Two times a day (BID) | ORAL | Status: DC

## 2013-07-27 NOTE — Telephone Encounter (Signed)
That is fine, send the medication but make f/u appt per last labs.

## 2013-07-27 NOTE — Telephone Encounter (Signed)
Rx refill sent. CLS

## 2013-07-27 NOTE — Telephone Encounter (Signed)
Pt call and requested refills on metformin sent to [facility] in [place]

## 2013-07-27 NOTE — Telephone Encounter (Signed)
Shane, is this okay to refill? 

## 2013-08-29 ENCOUNTER — Telehealth: Payer: Self-pay | Admitting: Adult Health

## 2013-08-29 ENCOUNTER — Telehealth: Payer: Self-pay | Admitting: Medical

## 2013-08-29 NOTE — Telephone Encounter (Signed)
Patient wants to know if he can get Xanax RX due to anxiety attacks. States that Dr.McInnis is no longer prescribing narcotics. / tgs

## 2013-08-29 NOTE — Telephone Encounter (Signed)
LMTCB will refer pt to get talk with pcp to get a ref to behavioral health to get proper treatment and cardiology does not follow this

## 2013-08-30 NOTE — Telephone Encounter (Signed)
Refer to pain clinic.  There are some ortho records in the computer and my OV note from last visit to send.  He is also due for routine f/u here.

## 2013-08-31 NOTE — Telephone Encounter (Signed)
I fax his records over to [facility]. CLS

## 2013-09-13 ENCOUNTER — Other Ambulatory Visit: Payer: Self-pay | Admitting: Medical

## 2013-09-13 NOTE — Telephone Encounter (Signed)
Ok to RF? Your last note seems to suggest a one time refill.

## 2013-10-11 ENCOUNTER — Other Ambulatory Visit: Payer: Self-pay | Admitting: Medical

## 2013-10-12 ENCOUNTER — Ambulatory Visit (INDEPENDENT_AMBULATORY_CARE_PROVIDER_SITE_OTHER): Payer: 59 | Admitting: Cardiovascular Disease

## 2013-10-12 ENCOUNTER — Other Ambulatory Visit: Payer: Self-pay | Admitting: Medical

## 2013-10-12 ENCOUNTER — Encounter: Payer: Self-pay | Admitting: Cardiovascular Disease

## 2013-10-12 VITALS — BP 90/52 | HR 48 | Ht 72.0 in | Wt 223.0 lb

## 2013-10-12 DIAGNOSIS — I10 Essential (primary) hypertension: Secondary | ICD-10-CM

## 2013-10-12 DIAGNOSIS — I5023 Acute on chronic systolic (congestive) heart failure: Secondary | ICD-10-CM

## 2013-10-12 DIAGNOSIS — R001 Bradycardia, unspecified: Secondary | ICD-10-CM

## 2013-10-12 DIAGNOSIS — I5032 Chronic diastolic (congestive) heart failure: Secondary | ICD-10-CM

## 2013-10-12 DIAGNOSIS — I519 Heart disease, unspecified: Secondary | ICD-10-CM

## 2013-10-12 DIAGNOSIS — I9589 Other hypotension: Secondary | ICD-10-CM

## 2013-10-12 DIAGNOSIS — I509 Heart failure, unspecified: Secondary | ICD-10-CM

## 2013-10-12 DIAGNOSIS — I498 Other specified cardiac arrhythmias: Secondary | ICD-10-CM

## 2013-10-12 DIAGNOSIS — I420 Dilated cardiomyopathy: Secondary | ICD-10-CM | POA: Insufficient documentation

## 2013-10-12 DIAGNOSIS — I428 Other cardiomyopathies: Secondary | ICD-10-CM

## 2013-10-12 MED ORDER — SPIRONOLACTONE 25 MG PO TABS: 12.5000 mg | ORAL_TABLET | Freq: Every day | ORAL | Status: DC

## 2013-10-12 MED ORDER — LOSARTAN POTASSIUM 50 MG PO TABS: 50.0000 mg | ORAL_TABLET | Freq: Every day | ORAL | Status: DC

## 2013-10-12 MED ORDER — TORSEMIDE 20 MG PO TABS: 20.0000 mg | ORAL_TABLET | Freq: Two times a day (BID) | ORAL | Status: DC

## 2013-10-12 MED ORDER — CARVEDILOL 6.25 MG PO TABS: 6.2500 mg | ORAL_TABLET | Freq: Two times a day (BID) | ORAL | Status: DC

## 2013-10-12 NOTE — Patient Instructions (Signed)
Your physician recommends that you schedule a follow-up appointment in: 2-3 weeks    Your physician has recommended you make the following change in your medication:     STOP Lasix  START Torsemide 20 mg twice a day  GET BLOOD WORK Monday 10/15/13 (BMET)   DECREASE Coreg to 6.25 mg twice a day  DECREASE Cozaar to 50 mg daily    Please make sure you have an apt with Dr.Taylor for your ICD check        Thank you for choosing [facility] !

## 2013-10-12 NOTE — Progress Notes (Signed)
Patient ID: [name] [name], male   DOB: 1956/01/10, 58 y.o.   MRN: [id number]      SUBJECTIVE: The patient is a 58 year old male with a history of a nonischemic cardiomyopathy and chronic systolic heart failure. His most recent ejection fraction was assessed at 25-30% on 03/15/13, with severe LVH and grade 1 diastolic dysfunction. Coronary angiography revealed normal coronary anatomy.  He was to follow up with Dr. Lovena Le for ICD consideration but this never occurred.   Wt 223 lbs (215 on 03/09/13).  He has been getting more fatigued with exertion. He has a slope in his backyard and when he has to walk back uphill he feels tired out. He denies exertional dyspnea per se. He denies chest pain, orthopnea and paroxysmal nocturnal dyspnea. He denies leg swelling but feels that his abdomen is more swollen and distended. He has been taking Lasix 40 mg in the morning and 20 mg in the evening without relief.   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    Review of Systems: As per "subjective", otherwise negative.  No Known Allergies  Current Outpatient Prescriptions  Medication Sig Dispense Refill  . acetaminophen-codeine (TYLENOL #3) 300-30 MG per tablet Take 1 tablet by mouth every 4 (four) hours as needed for moderate pain.       Marland Kitchen ALPRAZolam (XANAX) 1 MG tablet Take 1 tablet (1 mg total) by mouth 3 (three) times daily as needed for anxiety.  60 tablet  0  . carvedilol (COREG) 12.5 MG tablet TAKE 1 TABLET BY MOUTH TWICE DAILY WITH A MEAL  60 tablet  9  . furosemide (LASIX) 20 MG tablet TAKE 1- 2 TABLETS BY MOUTH EVERY MORNING, THEN TAKE 1 TABLET BY MOUTH EVERY EVENING  90 tablet  0  . HYDROcodone-acetaminophen (NORCO) 10-325 MG per tablet Take 1 tablet by mouth every 6 (six) hours as needed.  45 tablet  0  . losartan (COZAAR) 50 MG tablet TAKE 1 1/2 TABLETS BY MOUTH EVERY DAY  45 tablet  0  . metFORMIN (GLUCOPHAGE) 500 MG tablet  Take 1 tablet (500 mg total) by mouth 2 (two) times daily with a meal.  60 tablet  2  . simvastatin (ZOCOR) 40 MG tablet Take 1 tablet (40 mg total) by mouth at bedtime.  30 tablet  1  . spironolactone (ALDACTONE) 25 MG tablet Take 0.5 tablets (12.5 mg total) by mouth daily.  30 tablet  1   No current facility-administered medications for this visit.    Past Medical History  Diagnosis Date  . Diabetes mellitus   . Fluid retention in legs   . CHF (congestive heart failure)   . Chronic systolic heart failure   . Hypertension     Past Surgical History  Procedure Laterality Date  . Hernia repair    . Colonoscopy  01/04/2012    Procedure: COLONOSCOPY;  Surgeon: Danie Binder, MD;  Location: [facility];  Service: Endoscopy;  Laterality: N/A;  1:30 PM    History   Social History  . Marital Status: Married    Spouse Name: N/A    Number of Children: N/A  . Years of Education: N/A   Occupational History  . Not on file.   Social History Main Topics  . Smoking status: Former Smoker -- 0.50 packs/day    Types: Cigarettes, E-cigarettes    Start date: 10/13/1982  . Smokeless tobacco: Never Used  Comment: electronic cig for 4 months   . Alcohol Use: Yes     Comment: weekends  . Drug Use: No  . Sexual Activity: Not on file   Other Topics Concern  . Not on file   Social History Narrative  . No narrative on file     Filed Vitals:   10/12/13 1311  BP: 90/52  Pulse: 48  Height: 6' (1.829 m)  Weight: 223 lb (101.152 kg)    PHYSICAL EXAM General: NAD HEENT: Normal. Neck: No JVD, no thyromegaly. Lungs: Clear to auscultation bilaterally with normal respiratory effort. CV: Nondisplaced PMI.  Regular rate and rhythm, normal S1/S2, no S3/S4, no murmur. No pretibial or periankle edema.    Abdomen: Soft, nontender, no hepatosplenomegaly, no significant distention.  Neurologic: Alert and oriented x 3.  Psych: Normal affect. Skin: Normal. Musculoskeletal: Normal range of  motion, no gross deformities. Extremities: No clubbing or cyanosis.   ECG: Most recent ECG reviewed.      ASSESSMENT AND PLAN: 1. Nonischemic cardiomyopathy/acute on chronic systolic heart failure: Currently with NYHA class II symptoms. However has had 8 lb weight gain since 02/2013 with complaints of abdominal distention. Given hemodynamics (hypotension, bradycardia), will reduce Coreg to 6.25 mg bid and losartan to 50 mg daily. Will switch Lasix to torsemide 20 mg bid and check BMET on 9/28. Continue spironolactone 12.5 mg daily, which I may increase depending upon renal function. He may also require KCl supplementation.  2. Essential HTN:  Hypotensive with med changes as noted above. 3. Severe LV dysfunction: Will need ICD. Will f/u with Dr. Lovena Le.  Dispo: f/u 2-3 weeks.  Kate Sable, M.D., F.A.C.C.

## 2013-10-15 LAB — BASIC METABOLIC PANEL
BUN: 20 mg/dL (ref 6–23)
CO2: 29 mEq/L (ref 19–32)
Calcium: 8.7 mg/dL (ref 8.4–10.5)
Chloride: 100 mEq/L (ref 96–112)
Creat: 1.17 mg/dL (ref 0.50–1.35)
Glucose, Bld: 106 mg/dL — ABNORMAL HIGH (ref 70–99)
Potassium: 3.7 mEq/L (ref 3.5–5.3)
Sodium: 139 mEq/L (ref 135–145)

## 2013-11-02 ENCOUNTER — Ambulatory Visit (INDEPENDENT_AMBULATORY_CARE_PROVIDER_SITE_OTHER): Payer: 59 | Admitting: Cardiovascular Disease

## 2013-11-02 ENCOUNTER — Encounter: Payer: Self-pay | Admitting: Cardiovascular Disease

## 2013-11-02 VITALS — BP 126/82 | HR 77 | Ht 72.0 in | Wt 220.0 lb

## 2013-11-02 DIAGNOSIS — I5032 Chronic diastolic (congestive) heart failure: Secondary | ICD-10-CM

## 2013-11-02 DIAGNOSIS — I429 Cardiomyopathy, unspecified: Secondary | ICD-10-CM

## 2013-11-02 DIAGNOSIS — E0822 Diabetes mellitus due to underlying condition with diabetic chronic kidney disease: Secondary | ICD-10-CM

## 2013-11-02 DIAGNOSIS — I519 Heart disease, unspecified: Secondary | ICD-10-CM

## 2013-11-02 DIAGNOSIS — Z23 Encounter for immunization: Secondary | ICD-10-CM

## 2013-11-02 DIAGNOSIS — I5023 Acute on chronic systolic (congestive) heart failure: Secondary | ICD-10-CM

## 2013-11-02 DIAGNOSIS — I420 Dilated cardiomyopathy: Secondary | ICD-10-CM

## 2013-11-02 DIAGNOSIS — I10 Essential (primary) hypertension: Secondary | ICD-10-CM

## 2013-11-02 MED ORDER — SPIRONOLACTONE 25 MG PO TABS: 25.0000 mg | ORAL_TABLET | Freq: Every day | ORAL | Status: DC

## 2013-11-02 NOTE — Progress Notes (Signed)
Patient ID: [name] [name], male   DOB: 1955/05/09, 58 y.o.   MRN: [id number]      SUBJECTIVE: The patient presents for follow up of acute on chronic systolic heart failure with severely reduced LV systolic function and nonischemic cardiomyopathy. He is feeling much better and by his home scale he has lost between 4-6 lbs since 9/25. His abdomen feels less distended. He is trying to watch his diet carefully, particularly with regards to sodium intake. BMET  On 9/28: BUN 20, creatinine 1.17, K 3.7.  Review of Systems: As per "subjective", otherwise negative.  No Known Allergies  Current Outpatient Prescriptions  Medication Sig Dispense Refill  . acetaminophen-codeine (TYLENOL #3) 300-30 MG per tablet Take 1 tablet by mouth every 4 (four) hours as needed for moderate pain.       Marland Kitchen ALPRAZolam (XANAX) 1 MG tablet Take 1 tablet (1 mg total) by mouth 3 (three) times daily as needed for anxiety.  60 tablet  0  . carvedilol (COREG) 6.25 MG tablet Take 1 tablet (6.25 mg total) by mouth 2 (two) times daily.  180 tablet  3  . HYDROcodone-acetaminophen (NORCO) 10-325 MG per tablet Take 1 tablet by mouth every 6 (six) hours as needed.  45 tablet  0  . losartan (COZAAR) 50 MG tablet Take 1 tablet (50 mg total) by mouth daily.  90 tablet  3  . metFORMIN (GLUCOPHAGE) 500 MG tablet Take 1 tablet (500 mg total) by mouth 2 (two) times daily with a meal.  60 tablet  2  . simvastatin (ZOCOR) 40 MG tablet Take 1 tablet (40 mg total) by mouth at bedtime.  30 tablet  1  . spironolactone (ALDACTONE) 25 MG tablet Take 0.5 tablets (12.5 mg total) by mouth daily.  90 tablet  3  . torsemide (DEMADEX) 20 MG tablet Take 1 tablet (20 mg total) by mouth 2 (two) times daily.  180 tablet  3   No current facility-administered medications for this visit.    Past Medical History  Diagnosis Date  . Diabetes mellitus   . Fluid retention in legs   . CHF (congestive heart failure)   . Chronic systolic heart failure   .  Hypertension     Past Surgical History  Procedure Laterality Date  . Hernia repair    . Colonoscopy  01/04/2012    Procedure: COLONOSCOPY;  Surgeon: Danie Binder, MD;  [facility];  Service: Endoscopy;  Laterality: N/A;  1:30 PM    History   Social History  . Marital Status: Married    Spouse Name: N/A    Number of Children: N/A  . Years of Education: N/A   Occupational History  . Not on file.   Social History Main Topics  . Smoking status: Former Smoker -- 0.50 packs/day    Types: Cigarettes, E-cigarettes    Start date: 10/13/1982  . Smokeless tobacco: Never Used     Comment: electronic cig for 4 months   . Alcohol Use: Yes     Comment: weekends  . Drug Use: No  . Sexual Activity: Not on file   Other Topics Concern  . Not on file   Social History Narrative  . No narrative on file      PHYSICAL EXAM General: NAD HEENT: Normal. Neck: No JVD, no thyromegaly. Lungs: Clear to auscultation bilaterally with normal respiratory effort. CV: Nondisplaced PMI.  Regular rate and rhythm, normal S1/S2, no S3/S4, no murmur. No pretibial or periankle edema.  No carotid bruit.  Normal pedal pulses.  Abdomen: Soft, nontender, no hepatosplenomegaly, no distention.  Neurologic: Alert and oriented x 3.  Psych: Normal affect. Skin: Normal. Musculoskeletal: Normal range of motion, no gross deformities. Extremities: No clubbing or cyanosis.   ECG: Most recent ECG reviewed.      ASSESSMENT AND PLAN: 1. Nonischemic cardiomyopathy/acute on chronic systolic heart failure: Currently with NYHA class II symptoms with improvement overall. Has lost 4-6 lbs by home scale, and 3 lbs by office scale. Given hemodynamics at his last visit (hypotension, bradycardia), I had reduced Coreg to 6.25 mg bid and losartan to 50 mg daily. I will continue at these present dosages as his BP and HR have improved. Renal function is stable as per most recent BMET I had ordered. Continue  torsemide 20 mg bid. Will increase spironolactone to 25 mg daily. I have asked him to inform me if he becomes lightheaded/dizzy and BP becomes low again. 2. Essential HTN: Normotensive today with medication changes made at last visit. As I am increasing spironolactone, this will need continued monitoring. 3. Severe LV dysfunction: Will need ICD. Will f/u with Dr. Lovena Le.  4. Type 2 diabetes: Stable on current dose of metformin, no changes.  Dispo: f/u 2 months.   Kate Sable, M.D., F.A.C.C.

## 2013-11-02 NOTE — Patient Instructions (Addendum)
Your physician recommends that you schedule a follow-up appointment in: 2 months     Your physician has recommended you make the following change in your medication:      INCREASE Spironolactone to 25 mg daily      Thank you for choosing [facility] !

## 2013-11-09 ENCOUNTER — Ambulatory Visit: Payer: 59 | Admitting: Medical

## 2013-11-20 ENCOUNTER — Encounter: Payer: Self-pay | Admitting: Medical

## 2013-12-07 ENCOUNTER — Encounter: Payer: Self-pay | Admitting: Internal Medicine

## 2013-12-07 ENCOUNTER — Ambulatory Visit (INDEPENDENT_AMBULATORY_CARE_PROVIDER_SITE_OTHER): Payer: 59 | Admitting: Internal Medicine

## 2013-12-07 VITALS — BP 138/62 | HR 83 | Ht 72.0 in | Wt 217.8 lb

## 2013-12-07 DIAGNOSIS — I5022 Chronic systolic (congestive) heart failure: Secondary | ICD-10-CM

## 2013-12-07 DIAGNOSIS — I10 Essential (primary) hypertension: Secondary | ICD-10-CM

## 2013-12-07 NOTE — Progress Notes (Signed)
HPI [name] [name] is referred today for consideration for prophylactic ICD implantation. The patient is a very pleasant 58 year old man with chronic systolic heart failure, initially diagnosed approximately 2 years ago. Initially his ejection fraction was 15%, and after medical therapy improved to 40%. An echo over a year ago showed an EF of 25%. Since then he has been stable and has not required hospitalization. On maximal medical therapy, his heart failure symptoms are class II. He takes Aldactone, losartan, furosemide, and carvedilol. He has not had syncope. No Known Allergies   Current Outpatient Prescriptions  Medication Sig Dispense Refill  . acetaminophen-codeine (TYLENOL #3) 300-30 MG per tablet Take 1 tablet by mouth every 4 (four) hours as needed for moderate pain.     Marland Kitchen ALPRAZolam (XANAX) 1 MG tablet Take 1 tablet (1 mg total) by mouth 3 (three) times daily as needed for anxiety. 60 tablet 0  . carvedilol (COREG) 6.25 MG tablet Take 1 tablet (6.25 mg total) by mouth 2 (two) times daily. 180 tablet 3  . HYDROcodone-acetaminophen (NORCO) 10-325 MG per tablet Take 1 tablet by mouth every 6 (six) hours as needed. 45 tablet 0  . losartan (COZAAR) 50 MG tablet Take 1 tablet (50 mg total) by mouth daily. 90 tablet 3  . metFORMIN (GLUCOPHAGE) 500 MG tablet Take 1 tablet (500 mg total) by mouth 2 (two) times daily with a meal. 60 tablet 2  . spironolactone (ALDACTONE) 25 MG tablet Take 1 tablet (25 mg total) by mouth daily. 90 tablet 3  . torsemide (DEMADEX) 20 MG tablet Take 1 tablet (20 mg total) by mouth 2 (two) times daily. 180 tablet 3  . simvastatin (ZOCOR) 40 MG tablet Take 1 tablet (40 mg total) by mouth at bedtime. 30 tablet 1   No current facility-administered medications for this visit.     Past Medical History  Diagnosis Date  . Diabetes mellitus   . Fluid retention in legs   . CHF (congestive heart failure)   . Chronic systolic heart failure   . Hypertension     ROS:   All systems reviewed and negative except as noted in the HPI.   Past Surgical History  Procedure Laterality Date  . Hernia repair    . Colonoscopy  01/04/2012    Procedure: COLONOSCOPY;  Surgeon: Danie Binder, MD;  [facility];  Service: Endoscopy;  Laterality: N/A;  1:30 PM     Family History  Problem Relation Age of Onset  . Heart attack Father   . Heart failure Father   . Cancer Mother      History   Social History  . Marital Status: Married    Spouse Name: N/A    Number of Children: N/A  . Years of Education: N/A   Occupational History  . Not on file.   Social History Main Topics  . Smoking status: Former Smoker -- 0.50 packs/day    Types: Cigarettes, E-cigarettes    Start date: 10/13/1982    Quit date: 09/19/2011  . Smokeless tobacco: Never Used     Comment: electronic cig for 4 months   . Alcohol Use: Yes     Comment: weekends  . Drug Use: No  . Sexual Activity: Not on file   Other Topics Concern  . Not on file   Social History Narrative     BP 138/62 mmHg  Pulse 83  Ht 6' (1.829 m)  Wt 217 lb 12.8 oz (98.793 kg)  BMI 29.53 kg/m2  SpO2 99%  Physical Exam:  Well appearing 58 year old man, NAD HEENT: Unremarkable Neck:  7 cm JVD, no thyromegally Back:  No CVA tenderness Lungs:  Clear with no wheezes, rales, or rhonchi. HEART:  Regular rate rhythm, no murmurs, no rubs, no clicks, PMI is enlarged Abd:  soft, positive bowel sounds, no organomegally, no rebound, no guarding Ext:  2 plus pulses, no edema, no cyanosis, no clubbing Skin:  No rashes no nodules Neuro:  CN II through XII intact, motor grossly intact  EKG - normal sinus rhythm with right bundle branch block  Assess/Plan:

## 2013-12-07 NOTE — Patient Instructions (Signed)
Your physician recommends that you schedule a follow-up appointment after your echocardiogram  Your physician recommends that you continue on your current medications as directed. Please refer to the Current Medication list given to you today.  Your physician has requested that you have an echocardiogram. Echocardiography is a painless test that uses sound waves to create images of your heart. It provides your doctor with information about the size and shape of your heart and how well your heart's chambers and valves are working. This procedure takes approximately one hour. There are no restrictions for this procedure.  Following your echocardiogram results you may need an ICD. An implantable cardioverter defibrillator (ICD) is a small device that is placed in your chest or, in rare cases, your abdomen. This device uses electrical pulses or shocks to help control life-threatening, irregular heartbeats that could lead the heart to suddenly stop beating (sudden cardiac arrest). Leads are attached to the ICD that goes into your heart. This is done in the hospital and usually requires an overnight stay. Please see the instruction sheet given to you today for more information.  Thank you for choosing [facility]!!

## 2013-12-07 NOTE — Addendum Note (Signed)
Addended by: Julian Hy T on: 12/07/2013 11:35 AM   Modules accepted: Level of Service

## 2013-12-07 NOTE — Assessment & Plan Note (Signed)
He has been on maximal medical therapy for over a year. I have recommended a repeat echo. If his EF is still below 35% then he would like to proceed with ICD implant for primary prevention. Will schedule echo and followup as appropriate. He will continue his maximal heart failure medications.

## 2013-12-07 NOTE — Assessment & Plan Note (Signed)
His blood pressure has been well controlled. No change in meds. He will maintain a low sodium diet.

## 2013-12-12 ENCOUNTER — Ambulatory Visit (HOSPITAL_COMMUNITY)
Admission: RE | Admit: 2013-12-12 | Discharge: 2013-12-12 | Disposition: A | Discharge status: Home / Self Care | Payer: 59 | Source: Ambulatory Visit | Attending: Internal Medicine | Admitting: Internal Medicine

## 2013-12-12 DIAGNOSIS — I371 Nonrheumatic pulmonary valve insufficiency: Secondary | ICD-10-CM | POA: Insufficient documentation

## 2013-12-12 DIAGNOSIS — I10 Essential (primary) hypertension: Secondary | ICD-10-CM | POA: Insufficient documentation

## 2013-12-12 DIAGNOSIS — I509 Heart failure, unspecified: Secondary | ICD-10-CM | POA: Diagnosis present

## 2013-12-12 DIAGNOSIS — Z87891 Personal history of nicotine dependence: Secondary | ICD-10-CM | POA: Diagnosis not present

## 2013-12-12 DIAGNOSIS — I081 Rheumatic disorders of both mitral and tricuspid valves: Secondary | ICD-10-CM | POA: Diagnosis not present

## 2013-12-12 DIAGNOSIS — I5022 Chronic systolic (congestive) heart failure: Secondary | ICD-10-CM

## 2013-12-12 DIAGNOSIS — I369 Nonrheumatic tricuspid valve disorder, unspecified: Secondary | ICD-10-CM

## 2013-12-12 DIAGNOSIS — E119 Type 2 diabetes mellitus without complications: Secondary | ICD-10-CM | POA: Insufficient documentation

## 2013-12-12 NOTE — Progress Notes (Signed)
  Echocardiogram 2D Echocardiogram has been performed.  [place], [place] 12/12/2013, 11:36 AM

## 2013-12-17 ENCOUNTER — Telehealth: Payer: Self-pay | Admitting: *Deleted

## 2013-12-17 NOTE — Telephone Encounter (Signed)
Spoke with pt and does not want to proceed with ICD. Stated he wanted "to wait another year". Explained what echo results were and that DR. Lovena Le wanted to schedule ICD placement now. Encouraged pt to call us if he changed his mind. Forwarded to Dr. Lovena Le

## 2013-12-18 ENCOUNTER — Other Ambulatory Visit: Payer: Self-pay | Admitting: Medical

## 2013-12-18 NOTE — Telephone Encounter (Signed)
For some reason when I get this message I can't tell what prescription your referring to.   This is the third one of these today

## 2013-12-18 NOTE — Telephone Encounter (Signed)
Is this okay?

## 2013-12-27 ENCOUNTER — Encounter (HOSPITAL_COMMUNITY): Payer: Self-pay | Admitting: Cardiology

## 2013-12-28 ENCOUNTER — Encounter: Payer: Self-pay | Admitting: Cardiovascular Disease

## 2013-12-28 ENCOUNTER — Ambulatory Visit (INDEPENDENT_AMBULATORY_CARE_PROVIDER_SITE_OTHER): Payer: 59 | Admitting: Cardiovascular Disease

## 2013-12-28 VITALS — BP 122/70 | HR 100 | Ht 72.0 in | Wt 221.0 lb

## 2013-12-28 DIAGNOSIS — R000 Tachycardia, unspecified: Secondary | ICD-10-CM

## 2013-12-28 DIAGNOSIS — I5022 Chronic systolic (congestive) heart failure: Secondary | ICD-10-CM

## 2013-12-28 DIAGNOSIS — I5189 Other ill-defined heart diseases: Secondary | ICD-10-CM

## 2013-12-28 DIAGNOSIS — E0822 Diabetes mellitus due to underlying condition with diabetic chronic kidney disease: Secondary | ICD-10-CM

## 2013-12-28 DIAGNOSIS — I420 Dilated cardiomyopathy: Secondary | ICD-10-CM

## 2013-12-28 DIAGNOSIS — I519 Heart disease, unspecified: Secondary | ICD-10-CM

## 2013-12-28 DIAGNOSIS — I429 Cardiomyopathy, unspecified: Secondary | ICD-10-CM

## 2013-12-28 DIAGNOSIS — I10 Essential (primary) hypertension: Secondary | ICD-10-CM

## 2013-12-28 MED ORDER — LOSARTAN POTASSIUM 25 MG PO TABS: 25.0000 mg | ORAL_TABLET | Freq: Every day | ORAL | Status: DC

## 2013-12-28 MED ORDER — CARVEDILOL 12.5 MG PO TABS: 12.5000 mg | ORAL_TABLET | Freq: Two times a day (BID) | ORAL | Status: DC

## 2013-12-28 NOTE — Patient Instructions (Signed)
Your physician recommends that you schedule a follow-up appointment in: 3 months     Your physician has recommended you make the following change in your medication:     DECREASE Losartan to 25 mg daily    INCREASE Coreg to 12.5 mg twice a day     Thank you for choosing [facility] !

## 2013-12-28 NOTE — Progress Notes (Signed)
Patient ID: [name] [name], male   DOB: 1955/05/18, 58 y.o.   MRN: [id number]      SUBJECTIVE: The patient presents for follow up of chronic systolic heart failure with severely reduced LV systolic function and nonischemic cardiomyopathy. Echocardiogram in November demonstrated severely reduced left ventricular systolic function, EF 08-65%. Dr. Lovena Le also recommended ICD implantation but the patient has refused and would like to wait for at least another year, in the hopes that his heart function will improve. He denies chest pain and shortness of breath, as well as leg swelling and paroxysmal nocturnal dyspnea. He weighs less than 219 lbs on his home scale. He says he has been more anxious lately and cannot pinpoint why this is so.  Review of Systems: As per "subjective", otherwise negative.  No Known Allergies  Current Outpatient Prescriptions  Medication Sig Dispense Refill  . acetaminophen-codeine (TYLENOL #3) 300-30 MG per tablet Take 1 tablet by mouth every 4 (four) hours as needed for moderate pain.     Marland Kitchen ALPRAZolam (XANAX) 1 MG tablet Take 1 tablet (1 mg total) by mouth 3 (three) times daily as needed for anxiety. 60 tablet 0  . carvedilol (COREG) 6.25 MG tablet Take 1 tablet (6.25 mg total) by mouth 2 (two) times daily. 180 tablet 3  . HYDROcodone-acetaminophen (NORCO) 10-325 MG per tablet Take 1 tablet by mouth every 6 (six) hours as needed. 45 tablet 0  . losartan (COZAAR) 50 MG tablet Take 1 tablet (50 mg total) by mouth daily. 90 tablet 3  . metFORMIN (GLUCOPHAGE) 500 MG tablet TAKE 1 TABLET BY MOUTH TWICE DAILY WITH A MEAL 60 tablet 0  . simvastatin (ZOCOR) 40 MG tablet Take 1 tablet (40 mg total) by mouth at bedtime. 30 tablet 1  . spironolactone (ALDACTONE) 25 MG tablet Take 1 tablet (25 mg total) by mouth daily. 90 tablet 3  . torsemide (DEMADEX) 20 MG tablet Take 1 tablet (20 mg total) by mouth 2 (two) times daily. 180 tablet 3   No current facility-administered  medications for this visit.    Past Medical History  Diagnosis Date  . Diabetes mellitus   . Fluid retention in legs   . CHF (congestive heart failure)   . Chronic systolic heart failure   . Hypertension     Past Surgical History  Procedure Laterality Date  . Hernia repair    . Colonoscopy  01/04/2012    Procedure: COLONOSCOPY;  Surgeon: Danie Binder, MD;  [facility];  Service: Endoscopy;  Laterality: N/A;  1:30 PM  . Left and right heart catheterization with coronary angiogram N/A 07/09/2011    Procedure: LEFT AND RIGHT HEART CATHETERIZATION WITH CORONARY ANGIOGRAM;  Surgeon: Peter M Martinique, MD;  Location: [facility] CATH LAB;  Service: Cardiovascular;  Laterality: N/A;    History   Social History  . Marital Status: Married    Spouse Name: N/A    Number of Children: N/A  . Years of Education: N/A   Occupational History  . Not on file.   Social History Main Topics  . Smoking status: Former Smoker -- 0.50 packs/day    Types: Cigarettes, E-cigarettes    Start date: 10/13/1982    Quit date: 09/19/2011  . Smokeless tobacco: Never Used     Comment: electronic cig for 4 months   . Alcohol Use: Yes     Comment: weekends  . Drug Use: No  . Sexual Activity: Not on file   Other Topics Concern  .  Not on file   Social History Narrative    BP 122/70  Pulse 100  Weight 221 lb (100.245 kg) Height 6' (1.829 m)    PHYSICAL EXAM General: NAD HEENT: Normal. Neck: No JVD, no thyromegaly. Lungs: Clear to auscultation bilaterally with normal respiratory effort. CV: Nondisplaced PMI.  HR 100 bpm by auscultation, regular rhythm, normal S1/S2, no S3/S4, no murmur. No pretibial or periankle edema.  Abdomen: Soft, nontender, no hepatosplenomegaly, no distention.  Neurologic: Alert and oriented x 3.  Psych: Normal affect. Skin: Normal. Musculoskeletal: Normal range of motion, no gross deformities. Extremities: No clubbing or cyanosis.   ECG: Most recent ECG  reviewed.      ASSESSMENT AND PLAN: 1. Nonischemic cardiomyopathy/chronic systolic heart failure: Currently with NYHA class II symptoms. Given his mild tachycardia, I will increase Coreg to 12.5 mg bid and reduce losartan to 25 mg daily to avoid precipitating hypotension. Continue torsemide 20 mg bid and spironolactone 25 mg daily. 2. Essential HTN: Normotensive today. As I am increasing Coreg and reducing losartan, this will need continued monitoring. 3. Severe LV dysfunction: Refuses ICD.  4. Type 2 diabetes: Stable on current dose of metformin, no changes.  Dispo: f/u 3 months.   Kate Sable, M.D., F.A.C.C.

## 2014-01-15 ENCOUNTER — Other Ambulatory Visit (HOSPITAL_COMMUNITY): Payer: Self-pay | Admitting: Orthopaedic Surgery

## 2014-01-15 DIAGNOSIS — M542 Cervicalgia: Secondary | ICD-10-CM

## 2014-01-22 ENCOUNTER — Ambulatory Visit (HOSPITAL_COMMUNITY)
Admission: RE | Admit: 2014-01-22 | Discharge: 2014-01-22 | Disposition: A | Discharge status: Home / Self Care | Payer: 59 | Source: Ambulatory Visit | Attending: Orthopaedic Surgery | Admitting: Orthopaedic Surgery

## 2014-01-22 DIAGNOSIS — M542 Cervicalgia: Secondary | ICD-10-CM

## 2014-01-22 DIAGNOSIS — M2578 Osteophyte, vertebrae: Secondary | ICD-10-CM | POA: Diagnosis not present

## 2014-01-22 DIAGNOSIS — M5032 Other cervical disc degeneration, mid-cervical region: Secondary | ICD-10-CM | POA: Insufficient documentation

## 2014-01-22 DIAGNOSIS — M4802 Spinal stenosis, cervical region: Secondary | ICD-10-CM | POA: Insufficient documentation

## 2014-01-25 ENCOUNTER — Other Ambulatory Visit: Payer: Self-pay | Admitting: Medical

## 2014-02-15 DIAGNOSIS — Z6828 Body mass index (BMI) 28.0-28.9, adult: Secondary | ICD-10-CM | POA: Insufficient documentation

## 2014-03-05 ENCOUNTER — Other Ambulatory Visit: Payer: Self-pay | Admitting: Medical

## 2014-03-06 ENCOUNTER — Encounter: Payer: Self-pay | Admitting: Neurology

## 2014-03-06 ENCOUNTER — Ambulatory Visit (INDEPENDENT_AMBULATORY_CARE_PROVIDER_SITE_OTHER): Payer: 59 | Admitting: Neurology

## 2014-03-06 VITALS — BP 125/75 | HR 64 | Ht 72.0 in | Wt 216.6 lb

## 2014-03-06 DIAGNOSIS — M25521 Pain in right elbow: Secondary | ICD-10-CM

## 2014-03-06 DIAGNOSIS — M25529 Pain in unspecified elbow: Secondary | ICD-10-CM | POA: Insufficient documentation

## 2014-03-06 DIAGNOSIS — M79621 Pain in right upper arm: Secondary | ICD-10-CM

## 2014-03-06 MED ORDER — MIRTAZAPINE 30 MG PO TABS: 30.0000 mg | ORAL_TABLET | Freq: Every day | ORAL | Status: DC

## 2014-03-06 NOTE — Progress Notes (Signed)
Reason for visit: Arm pain  [name] [name] is a 59 y.o. male  History of present illness:  [name] [name] is a 59 year old right-handed black male with a two-month history of right arm pain. The patient indicates that when he would tilt his head to the right, he could induce pain down the right arm. He may also feel some discomfort in the arm when he flexes his head down with pain in the posterior aspect of the upper arm. The patient reports some right shoulder discomfort, there may be a certain way he can move the arm, and he will get a sharp pain in the shoulder joint. He indicates that he has had plain x-rays of the shoulder. MRI evaluation of the cervical spine suggests possible impingement of the right C6 and C7 nerve roots, but the patient has been seen by Dr. Sherwood Gambler, and it was felt that the nerve impingement was not significant. The patient believes that he is slightly weak in the right arm, he denies any numbness in the arm, and he denies significant issues with neck discomfort. He has no pain in the left arm or back pain or leg discomfort. He denies any balance issues, or difficulty controlling the bowels or the bladder. He denies any headache. He does suffer from an anxiety disorder, and he does not sleep well at night because of this.  Past Medical History  Diagnosis Date  . Diabetes mellitus   . Fluid retention in legs   . CHF (congestive heart failure)   . Chronic systolic heart failure   . Hypertension   . Anxiety     Past Surgical History  Procedure Laterality Date  . Hernia repair    . Colonoscopy  01/04/2012    Procedure: COLONOSCOPY;  Surgeon: Danie Binder, MD;  [facility];  Service: Endoscopy;  Laterality: N/A;  1:30 PM  . Left and right heart catheterization with coronary angiogram N/A 07/09/2011    Procedure: LEFT AND RIGHT HEART CATHETERIZATION WITH CORONARY ANGIOGRAM;  Surgeon: Peter M Martinique, MD;  Location: [facility] CATH LAB;  Service: Cardiovascular;   Laterality: N/A;    Family History  Problem Relation Age of Onset  . Heart attack Father   . Heart failure Father   . Cancer Mother     Multiple myeloma  . Heart disease Sister     Social history:  reports that he quit smoking about 2 years ago. His smoking use included Cigarettes and E-cigarettes. He started smoking about 31 years ago. He smoked 0.50 packs per day. He has never used smokeless tobacco. He reports that he drinks alcohol. He reports that he does not use illicit drugs.  Medications:  Current Outpatient Prescriptions on File Prior to Visit  Medication Sig Dispense Refill  . carvedilol (COREG) 12.5 MG tablet Take 1 tablet (12.5 mg total) by mouth 2 (two) times daily. 180 tablet 3  . HYDROcodone-acetaminophen (NORCO) 10-325 MG per tablet Take 1 tablet by mouth every 6 (six) hours as needed. 45 tablet 0  . losartan (COZAAR) 25 MG tablet Take 1 tablet (25 mg total) by mouth daily. 90 tablet 3  . metFORMIN (GLUCOPHAGE) 500 MG tablet TAKE 1 TABLET BY MOUTH TWICE DAILY WITH A MEAL 60 tablet 0  . simvastatin (ZOCOR) 40 MG tablet Take 1 tablet (40 mg total) by mouth at bedtime. 30 tablet 1  . spironolactone (ALDACTONE) 25 MG tablet Take 1 tablet (25 mg total) by mouth daily. 90 tablet 3  .  torsemide (DEMADEX) 20 MG tablet Take 1 tablet (20 mg total) by mouth 2 (two) times daily. 180 tablet 3  . ALPRAZolam (XANAX) 1 MG tablet Take 1 tablet (1 mg total) by mouth 3 (three) times daily as needed for anxiety. (Patient not taking: Reported on 03/06/2014) 60 tablet 0   No current facility-administered medications on file prior to visit.     No Known Allergies  ROS:  Out of a complete 14 system review of symptoms, the patient complains only of the following symptoms, and all other reviewed systems are negative.  Joint pain, achy muscles Anxiety, decreased energy Insomnia  Blood pressure 125/75, pulse 64, height 6' (1.829 m), weight 216 lb 9.6 oz (98.249 kg).  Physical  Exam  General: The patient is alert and cooperative at the time of the examination.  Eyes: Pupils are equal, round, and reactive to light. Discs are flat bilaterally.  Neck: The neck is supple, no carotid bruits are noted.  Respiratory: The respiratory examination is clear.  Cardiovascular: The cardiovascular examination reveals a regular rate and rhythm, no obvious murmurs or rubs are noted.  Neuromuscular: Range of movement of the cervical spine was full.  Skin: Extremities are without significant edema.  Neurologic Exam  Mental status: The patient is alert and oriented x 3 at the time of the examination. The patient has apparent normal recent and remote memory, with an apparently normal attention span and concentration ability.  Cranial nerves: Facial symmetry is present. There is good sensation of the face to pinprick and soft touch bilaterally. The strength of the facial muscles and the muscles to head turning and shoulder shrug are normal bilaterally. Speech is well enunciated, no aphasia or dysarthria is noted. Extraocular movements are full. Visual fields are full. The tongue is midline, and the patient has symmetric elevation of the soft palate. No obvious hearing deficits are noted.  Motor: The motor testing reveals 5 over 5 strength of all 4 extremities. Good symmetric motor tone is noted throughout.  Sensory: Sensory testing is intact to pinprick, soft touch, vibration sensation, and position sense on all 4 extremities. No evidence of extinction is noted.  Coordination: Cerebellar testing reveals good finger-nose-finger and heel-to-shin bilaterally.  Gait and station: Gait is normal. Tandem gait is normal. Romberg is negative. No drift is seen.  Reflexes: Deep tendon reflexes are symmetric, but are depressed bilaterally. Toes are downgoing bilaterally.   MRI cervical 01/22/14:  IMPRESSION: 1. C5-C6 mild RIGHT foraminal stenosis due to uncovertebral spurring, potentially  irritating the RIGHT C6 nerve. 2. C6-C7 RIGHT foraminal stenosis due to uncovertebral spurring potentially affecting the RIGHT C7 nerve.   Assessment/Plan:  1. Right upper extremity discomfort  The patient does report pain down the right arm with certain positions of the head and neck. The patient may have a very low-grade nerve root impingement syndrome. The patient also reports some pain with the right shoulder with certain arm movements. The patient will be set up for nerve conduction studies of both arms, EMG evaluation of the right arm. The patient reports a lot of anxiety issues, difficulty with sleeping, a prescription for Remeron will be called in.  Jill Alexanders MD 03/06/2014 8:08 PM  [facility] [address] [place], [place] [address]  Phone [phone] Fax [phone]

## 2014-03-12 ENCOUNTER — Ambulatory Visit (INDEPENDENT_AMBULATORY_CARE_PROVIDER_SITE_OTHER): Payer: PRIVATE HEALTH INSURANCE | Admitting: Medical

## 2014-03-12 ENCOUNTER — Encounter: Payer: Self-pay | Admitting: Medical

## 2014-03-12 ENCOUNTER — Ambulatory Visit (INDEPENDENT_AMBULATORY_CARE_PROVIDER_SITE_OTHER): Payer: 59 | Admitting: Neurology

## 2014-03-12 ENCOUNTER — Encounter: Payer: Self-pay | Admitting: Neurology

## 2014-03-12 ENCOUNTER — Ambulatory Visit (INDEPENDENT_AMBULATORY_CARE_PROVIDER_SITE_OTHER): Payer: Self-pay | Admitting: Neurology

## 2014-03-12 VITALS — BP 110/80 | HR 79 | Temp 98.4°F | Resp 15 | Wt 208.0 lb

## 2014-03-12 DIAGNOSIS — M79621 Pain in right upper arm: Secondary | ICD-10-CM

## 2014-03-12 DIAGNOSIS — F411 Generalized anxiety disorder: Secondary | ICD-10-CM

## 2014-03-12 DIAGNOSIS — M25521 Pain in right elbow: Secondary | ICD-10-CM

## 2014-03-12 MED ORDER — ALPRAZOLAM 1 MG PO TABS: 1.0000 mg | ORAL_TABLET | Freq: Two times a day (BID) | ORAL | Status: DC | PRN

## 2014-03-12 MED ORDER — CITALOPRAM HYDROBROMIDE 40 MG PO TABS: ORAL_TABLET | ORAL | Status: DC

## 2014-03-12 MED ORDER — DICLOFENAC SODIUM 75 MG PO TBEC: 75.0000 mg | DELAYED_RELEASE_TABLET | Freq: Two times a day (BID) | ORAL | Status: DC

## 2014-03-12 NOTE — Procedures (Signed)
     HISTORY:  [name] [name] is a 59 year old gentleman with a history of right shoulder and neck discomfort, pain down the right arm. He is being evaluated for possible cervical radiculopathy.  NERVE CONDUCTION STUDIES:  Nerve conduction studies were performed on both upper extremities. The distal motor latencies and motor amplitudes for the median and ulnar nerves were within normal limits. The F wave latencies and nerve conduction velocities for these nerves were also normal. The sensory latencies for the median and ulnar nerves were normal.   EMG STUDIES:  EMG study was performed on the right upper extremity:  The first dorsal interosseous muscle reveals 2 to 4 K units with full recruitment. No fibrillations or positive waves were noted. The abductor pollicis brevis muscle reveals 2 to 4 K units with full recruitment. No fibrillations or positive waves were noted. The extensor indicis proprius muscle reveals 1 to 3 K units with full recruitment. No fibrillations or positive waves were noted. The pronator teres muscle reveals 2 to 3 K units with full recruitment. No fibrillations or positive waves were noted. The biceps muscle reveals 1 to 2 K units with full recruitment. No fibrillations or positive waves were noted. The triceps muscle reveals 2 to 4 K units with full recruitment. No fibrillations or positive waves were noted. The anterior deltoid muscle reveals 2 to 3 K units with full recruitment. No fibrillations or positive waves were noted. The cervical paraspinal muscles were tested at 2 levels. No abnormalities of insertional activity were seen at either level tested. There was good relaxation.   IMPRESSION:  Nerve conduction studies done on both upper extremities were within normal limits. No evidence of a neuropathy is seen. EMG evaluation of the right upper extremity was unremarkable, without evidence of an overlying cervical radiculopathy.  Jill Alexanders  MD 03/12/2014 1:42 PM  [facility] [address] [place], [place] [address]  Phone [phone] Fax [phone]

## 2014-03-12 NOTE — Patient Instructions (Addendum)
Encounter Diagnosis  Name Primary?  . Generalized anxiety disorder Yes    Recommendations:  Begin citalopram one half tablet at bedtime daily for week, then go up to 1 tablet at bedtime  Continue to use Xanax, but try and use this only as needed not every single day and certainly not 3 times a day  I do recommend you pursue counseling.  This can be done through your pastor or local counselor in your area or hear in [place]. I do have a list of counselors down below  Call your cardiology office and asked them about a referral to cardiac rehabilitation  I would recommend you not watch prior time news  Let's see you back in 3-4 weeks or call sooner if having problems with the medication   [facility] [phone] office [email or web address] [address] [place], [place], [place] [address]  Rema Fendt, therapist  Toy Cookey, MA, clinical psychologist  Cognitive-Behavior Therapy; Mood Disorders; Anxiety Disorders; adult and child ADHD; Family Therapy; Stress Management; personal growth, and Marital Therapy.    Terrance Mass Ph.D., clinical psychologist Cognitive-Behavior Therapy; Mood Disorders; Anxiety Disorders; Stress     Management   [facility] [address], [place], [place] [address] [phone]   [facility], psychotherapist [address] [place], [place] [address] [phone]   Karin Golden Ph.D., clinical psychologist [phone] office [place], [place] [address] Cognitive Behavior Therapy, Depression, Bipolar, Anxiety, Grief and Loss

## 2014-03-12 NOTE — Progress Notes (Signed)
Please refer to EMG and nerve conduction study procedure note. 

## 2014-03-12 NOTE — Progress Notes (Signed)
[name] [name] is a 59 year old gentleman with a history of right shoulder and neck discomfort and pain down the right arm. He comes to the office today for EMG and nerve conduction study evaluation.  Nerve conduction studies done on both upper extremity was unremarkable. EMG of the right upper extremity was unremarkable.  The patient may have a low-grade nerve root irritation focus, but he also may have intrinsic right shoulder disease. The patient has undergone a plain x-ray the right shoulder that was unremarkable. He will be given a one-month trial on diclofenac taking 75 mg twice daily. If this is not effective, the patient may be set up for MRI of the right shoulder, and considerations may be made for an epidural steroid injection of the cervical spine. A prescription was called in for diclofenac taking 75 mg twice daily.

## 2014-03-12 NOTE — Progress Notes (Signed)
  Subjective:   [name] [name] is an 59 y.o. male who presents for recheck.    I have only seen him once this past year to establish in June.   He wasn't happy that we wouldn't take over his pain and anxiety medications at that time . We referred to ortho at that time, and advised he f/u with psychiatry.  He has not been back to discuss his chronic issues, diabetes or other.  He has seen cardiology and neurology in the meantime.   Sees orthopedist now, shoulder pain, Dr. Luna Glasgow, [facility].   He has hx/o general anxiety disorder x 3-4 years.   Mind races at night particularly.   For years had a stressful job, working in Loss adjuster, chartered, prodution, had to be very accurate as mistakes were costly.   Currently on disability since 11/2013.  Lives at home with wife and [place], [place].   Step son works, but doesn't necessarily help pay the bills.  Wife is retired .  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  Exercises some.  Has problems getting to sleep daily.  In the past has been prescribed xanax and other medications that didn't work and gave side effects but he can give specific examples.  Xanax always seemed to calm him down.     Past Medical History  Diagnosis Date  . Diabetes mellitus   . Fluid retention in legs   . CHF (congestive heart failure)   . Chronic systolic heart failure   . Hypertension   . Anxiety     The following portions of the patient's history were reviewed and updated as appropriate: allergies, current medications, past family history, past medical history, past social history, past surgical history and problem list.  ROS as in subjective above    Objective:   BP 110/80 mmHg  Pulse 79  Temp(Src) 98.4 F (36.9 C) (Oral)  Resp 15  Wt 208 lb (94.348 kg)  Gen: wdwn, nad Psych: pleasant, good eye contact, answers questions appropriately    Assessment:   Encounter Diagnosis  Name Primary?  . Generalized anxiety disorder  Yes    Plan:   Discussed his concerns, questions, treatment.   We discussed ways to deal with anxiety including counseling, exercise, stress reduction, financial counseling, not watching the 24 hour news circuits, listening to calming mucic.  Strongly recommended counseling with independent counselor or his pastor.  Begin trial of Citalopram, Xanax prn, but advised this was not meant to be long term every day, and we would work to limit this.  Discussed risks/benefits of medication. F/u 3-4wk.  F/u soon for routine chronic issues, diabetes  Advised he talke with cardiology office about referral to cardiac rehab.

## 2014-03-14 ENCOUNTER — Ambulatory Visit: Payer: 59 | Admitting: Medical

## 2014-03-18 ENCOUNTER — Other Ambulatory Visit: Payer: Self-pay | Admitting: Medical

## 2014-04-01 ENCOUNTER — Ambulatory Visit: Payer: 59 | Admitting: Cardiovascular Disease

## 2014-04-17 ENCOUNTER — Telehealth: Payer: Self-pay | Admitting: Medical

## 2014-04-17 NOTE — Telephone Encounter (Signed)
Pt called and stated he needs refill on xanax. Pt uses [facility] and can be reached at [phone].

## 2014-04-17 NOTE — Telephone Encounter (Signed)
1) per last visit, I wanted to see him back in 3-4 wk, which would be now, so make f/u appt 2) call pharmacy and make sure he actually picked up the citalopram too from last visit, not just the xanax

## 2014-04-18 NOTE — Telephone Encounter (Signed)
Please see the questions first.

## 2014-04-18 NOTE — Telephone Encounter (Signed)
[facility] xanax 03/12/14 for 60 which he picked up and [facility] filled celexa 03/12/24 which he picked up2/29/16, refill xanax?

## 2014-04-18 NOTE — Telephone Encounter (Signed)
[name] [name], patient would like a refill on XANAX sent to [facility]. Please advise

## 2014-04-19 ENCOUNTER — Other Ambulatory Visit: Payer: Self-pay | Admitting: Family Medicine

## 2014-04-19 MED ORDER — ALPRAZOLAM 1 MG PO TABS: 1.0000 mg | ORAL_TABLET | Freq: Two times a day (BID) | ORAL | Status: DC | PRN

## 2014-04-19 NOTE — Telephone Encounter (Signed)
Patient is aware of Dorothea Ogle PA message in detail. Patient has his appointment scheduled 05/16/14 I called out a 30 day supply to his pharmacy per Chana Bode PA

## 2014-04-19 NOTE — Telephone Encounter (Signed)
Call out 30 days of xanax, and make f/u appt now.  He was due back to recheck since we started celexa

## 2014-05-02 ENCOUNTER — Encounter: Payer: Self-pay | Admitting: Cardiovascular Disease

## 2014-05-02 ENCOUNTER — Ambulatory Visit (INDEPENDENT_AMBULATORY_CARE_PROVIDER_SITE_OTHER): Payer: 59 | Admitting: Cardiovascular Disease

## 2014-05-02 ENCOUNTER — Telehealth: Payer: Self-pay | Admitting: Medical

## 2014-05-02 VITALS — BP 114/76 | HR 87 | Ht 72.0 in | Wt 209.0 lb

## 2014-05-02 DIAGNOSIS — I420 Dilated cardiomyopathy: Secondary | ICD-10-CM

## 2014-05-02 DIAGNOSIS — I429 Cardiomyopathy, unspecified: Secondary | ICD-10-CM

## 2014-05-02 DIAGNOSIS — I519 Heart disease, unspecified: Secondary | ICD-10-CM

## 2014-05-02 DIAGNOSIS — E0822 Diabetes mellitus due to underlying condition with diabetic chronic kidney disease: Secondary | ICD-10-CM

## 2014-05-02 DIAGNOSIS — I10 Essential (primary) hypertension: Secondary | ICD-10-CM

## 2014-05-02 DIAGNOSIS — I5032 Chronic diastolic (congestive) heart failure: Secondary | ICD-10-CM

## 2014-05-02 DIAGNOSIS — I5022 Chronic systolic (congestive) heart failure: Secondary | ICD-10-CM | POA: Diagnosis not present

## 2014-05-02 DIAGNOSIS — R5383 Other fatigue: Secondary | ICD-10-CM

## 2014-05-02 MED ORDER — LOSARTAN POTASSIUM 25 MG PO TABS: 12.5000 mg | ORAL_TABLET | Freq: Every day | ORAL | Status: DC

## 2014-05-02 MED ORDER — SPIRONOLACTONE 25 MG PO TABS: 12.5000 mg | ORAL_TABLET | Freq: Every day | ORAL | Status: DC

## 2014-05-02 NOTE — Patient Instructions (Signed)
Your physician wants you to follow-up in: 6 months You will receive a reminder letter in the mail two months in advance. If you don't receive a letter, please call our office to schedule the follow-up appointment.     DECREASE Losartan to 12.5 mg daily   DECREASE Spironolactone to 12.5 mg daily    Get blood work in 1 week - BMET  05/10/14     Thank you for choosing [facility] !

## 2014-05-02 NOTE — Telephone Encounter (Signed)
Lets get him back in for recheck from last visit and to help address his overall health including diabetes.

## 2014-05-02 NOTE — Progress Notes (Signed)
Patient ID: RICAHRD SCHWAGER, male   DOB: 1955/02/22, 59 y.o.   MRN: [id number]      SUBJECTIVE: The patient presents for follow up of chronic systolic heart failure with severely reduced LV systolic function and nonischemic cardiomyopathy. Echocardiogram in November 2015 demonstrated severely reduced left ventricular systolic function, EF 35-46%. Dr. Lovena Le also recommended ICD implantation but the patient has refused and would like to wait for at least another year, in the hopes that his heart function will improve.  He denies chest pain, shortness of breath, orthopnea , paroxysmal nocturnal dyspnea, and leg swelling. If he stands up too quickly he may get lightheaded but denies syncope. He has felt slightly fatigued in the past 2-3 months.  ECG performed in the office today demonstrates normal sinus rhythm with a right bundle branch block and left anterior fascicular block with a PVC, heart rate 84 bpm. This is unchanged from one year ago.    Review of Systems: As per "subjective", otherwise negative.  No Known Allergies  Current Outpatient Prescriptions  Medication Sig Dispense Refill  . ALPRAZolam (XANAX) 1 MG tablet Take 1 tablet (1 mg total) by mouth 2 (two) times daily as needed for anxiety. 30 tablet 0  . carvedilol (COREG) 12.5 MG tablet Take 1 tablet (12.5 mg total) by mouth 2 (two) times daily. 180 tablet 3  . citalopram (CELEXA) 40 MG tablet 1/2 tablet QHS x 1 wk, then 1 tablet po QHS 30 tablet 2  . diclofenac (VOLTAREN) 75 MG EC tablet Take 1 tablet (75 mg total) by mouth 2 (two) times daily. 60 tablet 1  . HYDROcodone-acetaminophen (NORCO) 10-325 MG per tablet Take 1 tablet by mouth every 6 (six) hours as needed. 45 tablet 0  . losartan (COZAAR) 25 MG tablet Take 1 tablet (25 mg total) by mouth daily. 90 tablet 3  . metFORMIN (GLUCOPHAGE) 500 MG tablet TAKE 1 TABLET BY MOUTH TWICE DAILY WITH A MEAL 60 tablet 0  . mirtazapine (REMERON) 30 MG tablet Take 1 tablet (30 mg total)  by mouth at bedtime. 30 tablet 2  . simvastatin (ZOCOR) 40 MG tablet Take 1 tablet (40 mg total) by mouth at bedtime. 30 tablet 1  . spironolactone (ALDACTONE) 25 MG tablet Take 1 tablet (25 mg total) by mouth daily. 90 tablet 3  . torsemide (DEMADEX) 20 MG tablet Take 1 tablet (20 mg total) by mouth 2 (two) times daily. 180 tablet 3   No current facility-administered medications for this visit.    Past Medical History  Diagnosis Date  . Diabetes mellitus   . Fluid retention in legs   . CHF (congestive heart failure)   . Chronic systolic heart failure   . Hypertension   . Anxiety     Past Surgical History  Procedure Laterality Date  . Hernia repair    . Colonoscopy  01/04/2012    Procedure: COLONOSCOPY;  Surgeon: Danie Binder, MD;  Location: [facility];  Service: Endoscopy;  Laterality: N/A;  1:30 PM  . Left and right heart catheterization with coronary angiogram N/A 07/09/2011    Procedure: LEFT AND RIGHT HEART CATHETERIZATION WITH CORONARY ANGIOGRAM;  Surgeon: Peter M Martinique, MD;  Location: [facility] CATH LAB;  Service: Cardiovascular;  Laterality: N/A;    History   Social History  . Marital Status: Married    Spouse Name: N/A  . Number of Children: 0  . Years of Education: N/A   Occupational History  . disabled    Social  History Main Topics  . Smoking status: Former Smoker -- 0.50 packs/day    Types: Cigarettes, E-cigarettes    Start date: 10/13/1982    Quit date: 09/19/2011  . Smokeless tobacco: Never Used     Comment: electronic cig for 4 months   . Alcohol Use: Yes     Comment: weekends  . Drug Use: No  . Sexual Activity: Not on file   Other Topics Concern  . Not on file   Social History Narrative   Patient is right handed.   Patient seldom drinks caffeine.     Filed Vitals:   05/02/14 0938  BP: 114/76  Pulse: 87  Height: 6' (1.829 m)  Weight: 209 lb (94.802 kg)  SpO2: 97%    PHYSICAL EXAM General: NAD HEENT: Normal. Neck: No JVD, no  thyromegaly. Lungs: Clear to auscultation bilaterally with normal respiratory effort. CV: Nondisplaced PMI.  Regular rate and rhythm, normal S1/S2, no S3/S4, no murmur. No pretibial or periankle edema.  No carotid bruit.  Normal pedal pulses.  Abdomen: Soft, nontender, no hepatosplenomegaly, no distention.  Neurologic: Alert and oriented x 3.  Psych: Normal affect. Skin: Normal. Musculoskeletal: Normal range of motion, no gross deformities. Extremities: No clubbing or cyanosis.   ECG: Most recent ECG reviewed.      ASSESSMENT AND PLAN: 1. Nonischemic cardiomyopathy/chronic systolic heart failure: Euvolemic. Currently with NYHA class II symptoms. Continue Coreg 12.5 mg bid. Due to fatigue, will reduce losartan to 12.5 mg daily and spironolactone to 12.5 mg daily. Continue torsemide 20 mg bid. Will check BMET to assess K and renal function. 2. Essential HTN: Normotensive today. No changes. 3. Severe LV dysfunction: Refuses ICD.  4. Type 2 diabetes: Stable on current dose of metformin, no changes.  Dispo: f/u 6 months.   Kate Sable, M.D., F.A.C.C.

## 2014-05-03 ENCOUNTER — Other Ambulatory Visit: Payer: Self-pay | Admitting: Medical

## 2014-05-03 NOTE — Telephone Encounter (Signed)
Patient is aware and he does have his appointment scheduled 05/16/14

## 2014-05-11 LAB — BASIC METABOLIC PANEL
BUN: 25 mg/dL — ABNORMAL HIGH (ref 6–23)
CO2: 29 mEq/L (ref 19–32)
Calcium: 8.5 mg/dL (ref 8.4–10.5)
Chloride: 99 mEq/L (ref 96–112)
Creat: 1.92 mg/dL — ABNORMAL HIGH (ref 0.50–1.35)
Glucose, Bld: 116 mg/dL — ABNORMAL HIGH (ref 70–99)
Potassium: 3.3 mEq/L — ABNORMAL LOW (ref 3.5–5.3)
Sodium: 139 mEq/L (ref 135–145)

## 2014-05-14 ENCOUNTER — Telehealth: Payer: Self-pay

## 2014-05-14 DIAGNOSIS — E876 Hypokalemia: Secondary | ICD-10-CM

## 2014-05-14 MED ORDER — POTASSIUM CHLORIDE CRYS ER 20 MEQ PO TBCR: 40.0000 meq | EXTENDED_RELEASE_TABLET | Freq: Every day | ORAL | Status: DC

## 2014-05-14 MED ORDER — TORSEMIDE 20 MG PO TABS: 20.0000 mg | ORAL_TABLET | Freq: Every day | ORAL | Status: DC

## 2014-05-14 NOTE — Telephone Encounter (Signed)
-----   Message from Herminio Commons, MD sent at 05/13/2014  5:56 PM EDT ----- Reduce torsemide to 20 mg daily. Can take extra 20 mg for worsening SOB/leg swelling. Give 40 meq KCl x 2 days. Repeat BMET in 10 days.

## 2014-05-14 NOTE — Telephone Encounter (Signed)
Left message with wife for patient to call back,he is at doctors apt,have mailed lab slip,changed Torsemide dose in medication history and escribed Potassium to pharmacy

## 2014-05-15 ENCOUNTER — Telehealth: Payer: Self-pay

## 2014-05-15 NOTE — Telephone Encounter (Signed)
-----   Message from Herminio Commons, MD sent at 05/13/2014  5:56 PM EDT ----- Reduce torsemide to 20 mg daily. Can take extra 20 mg for worsening SOB/leg swelling. Give 40 meq KCl x 2 days. Repeat BMET in 10 days.

## 2014-05-15 NOTE — Telephone Encounter (Signed)
I spoke with patient,he will take KCL for 2 days,repeat BMET in 10 days and reduce torsemide to 20 mg daily

## 2014-05-16 ENCOUNTER — Ambulatory Visit: Payer: Self-pay | Admitting: Medical

## 2014-05-22 ENCOUNTER — Other Ambulatory Visit (HOSPITAL_COMMUNITY): Payer: Self-pay | Admitting: Orthopaedic Surgery

## 2014-05-22 DIAGNOSIS — M25511 Pain in right shoulder: Secondary | ICD-10-CM

## 2014-05-29 ENCOUNTER — Ambulatory Visit (HOSPITAL_COMMUNITY)
Admission: RE | Admit: 2014-05-29 | Discharge: 2014-05-29 | Disposition: A | Discharge status: Home / Self Care | Payer: 59 | Source: Ambulatory Visit | Attending: Orthopaedic Surgery | Admitting: Orthopaedic Surgery

## 2014-05-29 DIAGNOSIS — M25511 Pain in right shoulder: Secondary | ICD-10-CM | POA: Diagnosis present

## 2014-06-06 LAB — BASIC METABOLIC PANEL
BUN: 10 mg/dL (ref 6–23)
CO2: 24 mEq/L (ref 19–32)
Calcium: 8.6 mg/dL (ref 8.4–10.5)
Chloride: 106 mEq/L (ref 96–112)
Creat: 0.89 mg/dL (ref 0.50–1.35)
Glucose, Bld: 117 mg/dL — ABNORMAL HIGH (ref 70–99)
Potassium: 3.6 mEq/L (ref 3.5–5.3)
Sodium: 141 mEq/L (ref 135–145)

## 2014-06-26 ENCOUNTER — Other Ambulatory Visit: Payer: Self-pay | Admitting: Medical

## 2014-08-02 ENCOUNTER — Other Ambulatory Visit: Payer: Self-pay | Admitting: Medical

## 2014-08-12 ENCOUNTER — Telehealth: Payer: Self-pay | Admitting: Medical

## 2014-08-12 NOTE — Telephone Encounter (Signed)
Pt informed will have to wait til Wed for refills

## 2014-08-12 NOTE — Telephone Encounter (Signed)
I'll have to just do refills when I see him back.  He didn't come in for the previously scheduled April visit, see phone messages.

## 2014-08-12 NOTE — Telephone Encounter (Signed)
Pt has med ck appt Wednesday with you but is out of his Metformin and Xanax, can you refill to [facility]

## 2014-08-14 ENCOUNTER — Encounter: Payer: Self-pay | Admitting: Medical

## 2014-08-14 ENCOUNTER — Other Ambulatory Visit: Payer: Self-pay | Admitting: Medical

## 2014-08-14 ENCOUNTER — Ambulatory Visit (INDEPENDENT_AMBULATORY_CARE_PROVIDER_SITE_OTHER): Payer: 59 | Admitting: Medical

## 2014-08-14 VITALS — BP 110/70 | HR 79 | Temp 97.9°F | Resp 14 | Wt 221.0 lb

## 2014-08-14 DIAGNOSIS — B351 Tinea unguium: Secondary | ICD-10-CM | POA: Diagnosis not present

## 2014-08-14 DIAGNOSIS — L609 Nail disorder, unspecified: Secondary | ICD-10-CM

## 2014-08-14 DIAGNOSIS — B360 Pityriasis versicolor: Secondary | ICD-10-CM

## 2014-08-14 DIAGNOSIS — I429 Cardiomyopathy, unspecified: Secondary | ICD-10-CM | POA: Diagnosis not present

## 2014-08-14 DIAGNOSIS — I5022 Chronic systolic (congestive) heart failure: Secondary | ICD-10-CM

## 2014-08-14 DIAGNOSIS — R160 Hepatomegaly, not elsewhere classified: Secondary | ICD-10-CM | POA: Diagnosis not present

## 2014-08-14 DIAGNOSIS — I420 Dilated cardiomyopathy: Secondary | ICD-10-CM

## 2014-08-14 DIAGNOSIS — F411 Generalized anxiety disorder: Secondary | ICD-10-CM | POA: Diagnosis not present

## 2014-08-14 DIAGNOSIS — I10 Essential (primary) hypertension: Secondary | ICD-10-CM

## 2014-08-14 DIAGNOSIS — E1165 Type 2 diabetes mellitus with hyperglycemia: Secondary | ICD-10-CM | POA: Diagnosis not present

## 2014-08-14 DIAGNOSIS — E785 Hyperlipidemia, unspecified: Secondary | ICD-10-CM

## 2014-08-14 DIAGNOSIS — IMO0002 Reserved for concepts with insufficient information to code with codable children: Secondary | ICD-10-CM | POA: Insufficient documentation

## 2014-08-14 DIAGNOSIS — L602 Onychogryphosis: Secondary | ICD-10-CM

## 2014-08-14 LAB — LIPID PANEL
Cholesterol: 132 mg/dL (ref 125–200)
HDL: 50 mg/dL (ref >=40)
LDL Cholesterol: 55 mg/dL (ref <130)
Total CHOL/HDL Ratio: 2.6 Ratio (ref <=5.0)
Triglycerides: 134 mg/dL (ref <150)
VLDL: 27 mg/dL (ref <30)

## 2014-08-14 LAB — HEPATIC FUNCTION PANEL
ALT: 13 U/L (ref 9–46)
AST: 12 U/L (ref 10–35)
Albumin: 4.2 g/dL (ref 3.6–5.1)
Alkaline Phosphatase: 79 U/L (ref 40–115)
Bilirubin, Direct: 0.1 mg/dL (ref <=0.2)
Indirect Bilirubin: 0.3 mg/dL (ref 0.2–1.2)
Total Bilirubin: 0.4 mg/dL (ref 0.2–1.2)
Total Protein: 6.8 g/dL (ref 6.1–8.1)

## 2014-08-14 LAB — HEMOGLOBIN A1C
Hgb A1c MFr Bld: 7.6 % — ABNORMAL HIGH (ref <5.7)
Mean Plasma Glucose: 171 mg/dL — ABNORMAL HIGH (ref <117)

## 2014-08-14 MED ORDER — ALPRAZOLAM 1 MG PO TABS: 1.0000 mg | ORAL_TABLET | Freq: Two times a day (BID) | ORAL | Status: DC | PRN

## 2014-08-14 MED ORDER — METFORMIN HCL 1000 MG PO TABS: 1000.0000 mg | ORAL_TABLET | Freq: Two times a day (BID) | ORAL | Status: DC

## 2014-08-14 MED ORDER — KETOCONAZOLE 2 % EX SHAM: 1.0000 "application " | MEDICATED_SHAMPOO | CUTANEOUS | Status: DC

## 2014-08-14 MED ORDER — BUSPIRONE HCL 10 MG PO TABS: 10.0000 mg | ORAL_TABLET | Freq: Two times a day (BID) | ORAL | Status: DC

## 2014-08-14 NOTE — Addendum Note (Signed)
Addended by: Carlena Hurl on: 08/14/2014 02:12 PM   Modules accepted: Orders, Medications

## 2014-08-14 NOTE — Progress Notes (Signed)
Subjective:   [name] [name] is an 59 y.o. male who presents for follow up of Type 2 diabetes mellitus.   Patient is checking home blood sugars.  Checks morning and evening before meals.  Been out of metformin a few days, but has been compliant with metformin BID.  Last HgbA1C unsure? But thinks it was in the 5 range.  No GI side effects from metformin.   Home blood sugar records: 160 to 170 Current symptoms include: none.  Patient is checking their feet daily. Foot concerns (callous, ulcer, wound, thickened nails, toenail fungus, skin fungus, hammer toe): no concerns Last dilated eye exam unsure Current diet: well balanced Current exercise: walking Known diabetic complications: none  Hyperlipidemia - compliant with simvastatin nightly  BP and heart medications - compliant.  Not current taking potassium given teh spironolactone but is taking Coreg, Losartan, Spironolactone and Demadex.   Has rash Tinea Versicolor he gets on his neck every summer.  currently not taking anything for this.   Has concerns about thickened toenails.  No prior podiatry.    Anxiety - takes Xanax morning and night, but still feels restless.  Took Citalopram for 2 months but didn't feel any iimprovement   Hasn't went to counseling.  Has always been kind of tense.   Worries about things he can't control.  He has hx/o general anxiety disorder x 3-4 years.   Mind races at night particularly.   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   Currently on disability since 11/2013.  Lives at home with wife and [place], [place].   Step son works, but doesn't necessarily help pay the bills.  Wife is retired .  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  Exercises some.  Has problems getting to sleep daily.  In the past has been prescribed xanax and other medications that didn't work and gave side effects but he can give  specific examples.  Xanax always seemed to calm him down, feels like he needs to be on this TID.     The following portions of the patient's history were reviewed and updated as appropriate: allergies, current medications, past family history, past medical history, past social history, past surgical history and problem list.  ROS as in subjective above  Past Medical History  Diagnosis Date  . Diabetes mellitus   . Fluid retention in legs   . CHF (congestive heart failure)   . Chronic systolic heart failure   . Hypertension   . Anxiety      Objective:   BP 110/70 mmHg  Pulse 79  Temp(Src) 97.9 F (36.6 C) (Oral)  Resp 14  Wt 221 lb (100.245 kg)  Gen: wdwn, nad Psych: pleasant, good eye contact, answers questions appropriately Heart: RRR, normal s1, s2, no murmurs Lungs clear Ext: no edema 1+ pedal pulses See separate foot exam Abdomen:+bs, soft, vertical surgical scar, possible hepatomegaly, but otherwise no mass, no splenomegaly, non tender   Assessment:   Encounter Diagnoses  Name Primary?  . Generalized anxiety disorder Yes  . Diabetes mellitus type 2, uncontrolled   . Hyperlipidemia   . Hepatomegaly   . Essential hypertension   . Cardiomyopathy, dilated, nonischemic   . Chronic systolic heart failure   . Tinea versicolor   . Hypertrophic toenail   . Onychomycosis     Plan:   Reviewed their concerns. Labs today, discussed diabetic f/u, eye  doctor visits, foot care, glucose monitoring.    Patient Instructions  Recommendations:  We will call with lab results  Diabetes   Continue to check your feet daily for sores or wounds  Continue to check your blood sugar regularly  increase to Metformin 1000mg  twice daily  Hyperlipidemia   continue simvastatin at bedtime daily  Hypertension and heart disease   Continue your blood pressure and heart medications  See your heart doctor as scheduled  Anxiety   Begin trial of Buspar twice daily   Use  Xanax as needed, but try to wean down on this  Consider counseling  Tinea versicolor   Use the Nizoral shampoo 2 times per week, leave on for 10 minutes, then wash off  Toenails   I will give you further recommendations after we get labs back    f/u in 1 month, pending labs

## 2014-08-14 NOTE — Patient Instructions (Signed)
Recommendations:  We will call with lab results  Diabetes   Continue to check your feet daily for sores or wounds  Continue to check your blood sugar regularly  increase to Metformin 1000mg  twice daily  Hyperlipidemia   continue simvastatin at bedtime daily  Hypertension and heart disease   Continue your blood pressure and heart medications  See your heart doctor as scheduled  Anxiety   Begin trial of Buspar twice daily   Use Xanax as needed, but try to wean down on this  Consider counseling  Tinea versicolor   Use the Nizoral shampoo 2 times per week, leave on for 10 minutes, then wash off  Toenails   I will give you further recommendations after we get labs back

## 2014-08-15 ENCOUNTER — Other Ambulatory Visit: Payer: Self-pay | Admitting: Medical

## 2014-08-15 LAB — MICROALBUMIN / CREATININE URINE RATIO
Creatinine, Urine: 153.1 mg/dL
Microalb Creat Ratio: 3.9 mg/g (ref 0.0–30.0)
Microalb, Ur: 0.6 mg/dL (ref <2.0)

## 2014-08-15 MED ORDER — SIMVASTATIN 40 MG PO TABS: 40.0000 mg | ORAL_TABLET | Freq: Every day | ORAL | Status: DC

## 2014-08-15 MED ORDER — LOSARTAN POTASSIUM 25 MG PO TABS: 12.5000 mg | ORAL_TABLET | Freq: Every day | ORAL | Status: DC

## 2014-08-15 MED ORDER — TORSEMIDE 20 MG PO TABS: 20.0000 mg | ORAL_TABLET | Freq: Every day | ORAL | Status: DC

## 2014-08-15 MED ORDER — CARVEDILOL 12.5 MG PO TABS: 12.5000 mg | ORAL_TABLET | Freq: Two times a day (BID) | ORAL | Status: DC

## 2014-10-18 ENCOUNTER — Telehealth: Payer: Self-pay | Admitting: Cardiovascular Disease

## 2014-10-18 MED ORDER — TORSEMIDE 20 MG PO TABS: ORAL_TABLET | ORAL | Status: DC

## 2014-10-18 NOTE — Telephone Encounter (Signed)
Based on renal dysfunction, I had reduced it several months ago to 20 mg once daily, with instructions to take an extra 20 mg for increased shortness or breath and/or leg swelling (please reference telephone call note).

## 2014-10-18 NOTE — Telephone Encounter (Signed)
PCP decreased torsemide on 08/15/14,now pt wants to take it BID,please advise

## 2014-10-18 NOTE — Telephone Encounter (Signed)
Pt would like to know if he can take his Torsemide 2x a day instead of one and if it could be sent to [facility].

## 2014-11-11 ENCOUNTER — Other Ambulatory Visit: Payer: Self-pay | Admitting: Cardiovascular Disease

## 2014-11-13 ENCOUNTER — Telehealth: Payer: Self-pay | Admitting: Cardiovascular Disease

## 2014-11-13 NOTE — Telephone Encounter (Signed)
carvedilol (COREG) 12.5 MG tablet PATIENT asked that someone contact him in reference to this medication

## 2014-11-13 NOTE — Telephone Encounter (Signed)
he said that he spoke to [facility], she will call him back if he will take over the medication in question (carvedilol)

## 2015-05-20 ENCOUNTER — Other Ambulatory Visit: Payer: Self-pay | Admitting: Cardiovascular Disease

## 2015-08-05 ENCOUNTER — Ambulatory Visit (HOSPITAL_COMMUNITY)
Admission: RE | Admit: 2015-08-05 | Discharge: 2015-08-05 | Disposition: A | Discharge status: Home / Self Care | Payer: Medicare HMO | Source: Ambulatory Visit | Attending: Family Medicine | Admitting: Family Medicine

## 2015-08-05 ENCOUNTER — Other Ambulatory Visit (HOSPITAL_COMMUNITY): Payer: Self-pay | Admitting: Family Medicine

## 2015-08-05 DIAGNOSIS — M8949 Other hypertrophic osteoarthropathy, multiple sites: Secondary | ICD-10-CM

## 2015-08-05 DIAGNOSIS — M7581 Other shoulder lesions, right shoulder: Secondary | ICD-10-CM | POA: Insufficient documentation

## 2015-08-05 DIAGNOSIS — M150 Primary generalized (osteo)arthritis: Principal | ICD-10-CM

## 2015-08-05 DIAGNOSIS — M25511 Pain in right shoulder: Secondary | ICD-10-CM | POA: Insufficient documentation

## 2015-08-05 DIAGNOSIS — M19011 Primary osteoarthritis, right shoulder: Secondary | ICD-10-CM | POA: Diagnosis not present

## 2015-08-05 DIAGNOSIS — M159 Polyosteoarthritis, unspecified: Secondary | ICD-10-CM

## 2015-09-27 ENCOUNTER — Other Ambulatory Visit: Payer: Self-pay | Admitting: Cardiology

## 2015-10-28 ENCOUNTER — Other Ambulatory Visit: Payer: Self-pay | Admitting: Cardiology

## 2017-04-15 ENCOUNTER — Other Ambulatory Visit (HOSPITAL_COMMUNITY): Payer: Self-pay | Admitting: Family Medicine

## 2017-04-15 ENCOUNTER — Ambulatory Visit (HOSPITAL_COMMUNITY)
Admission: RE | Admit: 2017-04-15 | Discharge: 2017-04-15 | Disposition: A | Discharge status: Home / Self Care | Payer: Medicare PPO | Source: Ambulatory Visit | Attending: Family Medicine | Admitting: Family Medicine

## 2017-04-15 DIAGNOSIS — G8929 Other chronic pain: Secondary | ICD-10-CM | POA: Insufficient documentation

## 2017-04-15 DIAGNOSIS — M47816 Spondylosis without myelopathy or radiculopathy, lumbar region: Secondary | ICD-10-CM | POA: Insufficient documentation

## 2017-04-15 DIAGNOSIS — M1990 Unspecified osteoarthritis, unspecified site: Secondary | ICD-10-CM

## 2017-06-15 ENCOUNTER — Other Ambulatory Visit (HOSPITAL_COMMUNITY): Payer: Self-pay | Admitting: Family Medicine

## 2017-06-15 DIAGNOSIS — M545 Low back pain: Secondary | ICD-10-CM

## 2017-06-22 ENCOUNTER — Ambulatory Visit (HOSPITAL_COMMUNITY)
Admission: RE | Admit: 2017-06-22 | Discharge: 2017-06-22 | Disposition: A | Discharge status: Home / Self Care | Payer: Medicare PPO | Source: Ambulatory Visit | Attending: Family Medicine | Admitting: Family Medicine

## 2017-06-22 DIAGNOSIS — M5136 Other intervertebral disc degeneration, lumbar region: Secondary | ICD-10-CM | POA: Diagnosis not present

## 2017-06-22 DIAGNOSIS — M48061 Spinal stenosis, lumbar region without neurogenic claudication: Secondary | ICD-10-CM | POA: Diagnosis not present

## 2017-06-22 DIAGNOSIS — M545 Low back pain: Secondary | ICD-10-CM | POA: Diagnosis not present

## 2017-11-04 ENCOUNTER — Other Ambulatory Visit (HOSPITAL_COMMUNITY): Payer: Self-pay | Admitting: Family Medicine

## 2017-11-04 DIAGNOSIS — M545 Low back pain, unspecified: Secondary | ICD-10-CM

## 2017-11-11 ENCOUNTER — Ambulatory Visit (HOSPITAL_COMMUNITY)
Admission: RE | Admit: 2017-11-11 | Discharge: 2017-11-11 | Disposition: A | Discharge status: Home / Self Care | Payer: Medicare PPO | Source: Ambulatory Visit | Attending: Family Medicine | Admitting: Family Medicine

## 2017-11-11 DIAGNOSIS — M545 Low back pain, unspecified: Secondary | ICD-10-CM

## 2017-11-11 DIAGNOSIS — M5136 Other intervertebral disc degeneration, lumbar region: Secondary | ICD-10-CM | POA: Insufficient documentation

## 2017-11-11 DIAGNOSIS — M48061 Spinal stenosis, lumbar region without neurogenic claudication: Secondary | ICD-10-CM | POA: Diagnosis not present

## 2017-11-11 DIAGNOSIS — M4807 Spinal stenosis, lumbosacral region: Secondary | ICD-10-CM | POA: Diagnosis not present

## 2018-10-26 ENCOUNTER — Other Ambulatory Visit: Payer: Self-pay | Admitting: *Deleted

## 2018-10-26 DIAGNOSIS — Z20822 Contact with and (suspected) exposure to covid-19: Secondary | ICD-10-CM

## 2018-10-28 LAB — NOVEL CORONAVIRUS, NAA: SARS-CoV-2, NAA: NOT DETECTED

## 2018-10-30 ENCOUNTER — Telehealth: Payer: Self-pay | Admitting: General Practice

## 2018-10-30 NOTE — Telephone Encounter (Signed)
Negative COVID results given. Patient results "NOT Detected." Caller expressed understanding. ° °

## 2019-04-23 ENCOUNTER — Inpatient Hospital Stay (HOSPITAL_COMMUNITY)
Admission: EM | Admit: 2019-04-23 | Discharge: 2019-04-27 | DRG: 291 | Disposition: A | Discharge status: Home / Self Care | Payer: Medicare PPO | Attending: Internal Medicine | Admitting: Internal Medicine

## 2019-04-23 ENCOUNTER — Other Ambulatory Visit: Payer: Self-pay

## 2019-04-23 ENCOUNTER — Encounter (HOSPITAL_COMMUNITY): Payer: Self-pay | Admitting: Emergency Medicine

## 2019-04-23 ENCOUNTER — Emergency Department (HOSPITAL_COMMUNITY): Payer: Medicare PPO

## 2019-04-23 DIAGNOSIS — Z7984 Long term (current) use of oral hypoglycemic drugs: Secondary | ICD-10-CM | POA: Diagnosis not present

## 2019-04-23 DIAGNOSIS — E782 Mixed hyperlipidemia: Secondary | ICD-10-CM | POA: Diagnosis not present

## 2019-04-23 DIAGNOSIS — R7989 Other specified abnormal findings of blood chemistry: Secondary | ICD-10-CM | POA: Diagnosis present

## 2019-04-23 DIAGNOSIS — I428 Other cardiomyopathies: Secondary | ICD-10-CM | POA: Diagnosis present

## 2019-04-23 DIAGNOSIS — E1121 Type 2 diabetes mellitus with diabetic nephropathy: Secondary | ICD-10-CM

## 2019-04-23 DIAGNOSIS — I130 Hypertensive heart and chronic kidney disease with heart failure and stage 1 through stage 4 chronic kidney disease, or unspecified chronic kidney disease: Secondary | ICD-10-CM | POA: Diagnosis not present

## 2019-04-23 DIAGNOSIS — I5021 Acute systolic (congestive) heart failure: Secondary | ICD-10-CM | POA: Diagnosis not present

## 2019-04-23 DIAGNOSIS — Z7901 Long term (current) use of anticoagulants: Secondary | ICD-10-CM | POA: Diagnosis not present

## 2019-04-23 DIAGNOSIS — E1149 Type 2 diabetes mellitus with other diabetic neurological complication: Secondary | ICD-10-CM

## 2019-04-23 DIAGNOSIS — Z791 Long term (current) use of non-steroidal anti-inflammatories (NSAID): Secondary | ICD-10-CM

## 2019-04-23 DIAGNOSIS — Z20822 Contact with and (suspected) exposure to covid-19: Secondary | ICD-10-CM | POA: Diagnosis present

## 2019-04-23 DIAGNOSIS — Z807 Family history of other malignant neoplasms of lymphoid, hematopoietic and related tissues: Secondary | ICD-10-CM | POA: Diagnosis not present

## 2019-04-23 DIAGNOSIS — G8929 Other chronic pain: Secondary | ICD-10-CM | POA: Diagnosis present

## 2019-04-23 DIAGNOSIS — G894 Chronic pain syndrome: Secondary | ICD-10-CM | POA: Diagnosis present

## 2019-04-23 DIAGNOSIS — I5023 Acute on chronic systolic (congestive) heart failure: Secondary | ICD-10-CM | POA: Diagnosis present

## 2019-04-23 DIAGNOSIS — N1831 Chronic kidney disease, stage 3a: Secondary | ICD-10-CM | POA: Diagnosis present

## 2019-04-23 DIAGNOSIS — R0602 Shortness of breath: Secondary | ICD-10-CM | POA: Diagnosis present

## 2019-04-23 DIAGNOSIS — N179 Acute kidney failure, unspecified: Secondary | ICD-10-CM | POA: Diagnosis present

## 2019-04-23 DIAGNOSIS — E785 Hyperlipidemia, unspecified: Secondary | ICD-10-CM | POA: Diagnosis present

## 2019-04-23 DIAGNOSIS — I472 Ventricular tachycardia: Secondary | ICD-10-CM | POA: Diagnosis present

## 2019-04-23 DIAGNOSIS — Z79891 Long term (current) use of opiate analgesic: Secondary | ICD-10-CM | POA: Diagnosis not present

## 2019-04-23 DIAGNOSIS — F419 Anxiety disorder, unspecified: Secondary | ICD-10-CM | POA: Diagnosis present

## 2019-04-23 DIAGNOSIS — I4891 Unspecified atrial fibrillation: Secondary | ICD-10-CM | POA: Diagnosis not present

## 2019-04-23 DIAGNOSIS — I480 Paroxysmal atrial fibrillation: Secondary | ICD-10-CM | POA: Diagnosis present

## 2019-04-23 DIAGNOSIS — I5043 Acute on chronic combined systolic (congestive) and diastolic (congestive) heart failure: Secondary | ICD-10-CM | POA: Diagnosis present

## 2019-04-23 DIAGNOSIS — Z87891 Personal history of nicotine dependence: Secondary | ICD-10-CM | POA: Diagnosis not present

## 2019-04-23 DIAGNOSIS — E1122 Type 2 diabetes mellitus with diabetic chronic kidney disease: Secondary | ICD-10-CM | POA: Diagnosis present

## 2019-04-23 DIAGNOSIS — I513 Intracardiac thrombosis, not elsewhere classified: Secondary | ICD-10-CM | POA: Diagnosis present

## 2019-04-23 DIAGNOSIS — I509 Heart failure, unspecified: Secondary | ICD-10-CM

## 2019-04-23 DIAGNOSIS — K219 Gastro-esophageal reflux disease without esophagitis: Secondary | ICD-10-CM | POA: Diagnosis not present

## 2019-04-23 DIAGNOSIS — J9601 Acute respiratory failure with hypoxia: Secondary | ICD-10-CM | POA: Diagnosis present

## 2019-04-23 DIAGNOSIS — E1140 Type 2 diabetes mellitus with diabetic neuropathy, unspecified: Secondary | ICD-10-CM | POA: Diagnosis present

## 2019-04-23 DIAGNOSIS — Z8249 Family history of ischemic heart disease and other diseases of the circulatory system: Secondary | ICD-10-CM | POA: Diagnosis not present

## 2019-04-23 DIAGNOSIS — I4510 Unspecified right bundle-branch block: Secondary | ICD-10-CM | POA: Diagnosis present

## 2019-04-23 DIAGNOSIS — N189 Chronic kidney disease, unspecified: Secondary | ICD-10-CM | POA: Diagnosis present

## 2019-04-23 DIAGNOSIS — I10 Essential (primary) hypertension: Secondary | ICD-10-CM | POA: Diagnosis present

## 2019-04-23 DIAGNOSIS — Z79899 Other long term (current) drug therapy: Secondary | ICD-10-CM

## 2019-04-23 DIAGNOSIS — E1169 Type 2 diabetes mellitus with other specified complication: Secondary | ICD-10-CM

## 2019-04-23 DIAGNOSIS — N183 Chronic kidney disease, stage 3 unspecified: Secondary | ICD-10-CM | POA: Diagnosis not present

## 2019-04-23 LAB — TROPONIN I (HIGH SENSITIVITY)
Troponin I (High Sensitivity): 42 ng/L — ABNORMAL HIGH (ref <18)
Troponin I (High Sensitivity): 38 ng/L — ABNORMAL HIGH (ref <18)

## 2019-04-23 LAB — CBC
HCT: 39.3 % (ref 39.0–52.0)
Hemoglobin: 12.8 g/dL — ABNORMAL LOW (ref 13.0–17.0)
MCH: 35.9 pg — ABNORMAL HIGH (ref 26.0–34.0)
MCHC: 32.6 g/dL (ref 30.0–36.0)
MCV: 110.1 fL — ABNORMAL HIGH (ref 80.0–100.0)
Platelets: 191 10*3/uL (ref 150–400)
RBC: 3.57 MIL/uL — ABNORMAL LOW (ref 4.22–5.81)
RDW: 12.8 % (ref 11.5–15.5)
WBC: 7.2 10*3/uL (ref 4.0–10.5)
nRBC: 0 % (ref 0.0–0.2)

## 2019-04-23 LAB — COMPREHENSIVE METABOLIC PANEL
ALT: 66 U/L — ABNORMAL HIGH (ref 0–44)
AST: 44 U/L — ABNORMAL HIGH (ref 15–41)
Albumin: 3.5 g/dL (ref 3.5–5.0)
Alkaline Phosphatase: 100 U/L (ref 38–126)
Anion gap: 11 (ref 5–15)
BUN: 16 mg/dL (ref 8–23)
CO2: 21 mmol/L — ABNORMAL LOW (ref 22–32)
Calcium: 8.6 mg/dL — ABNORMAL LOW (ref 8.9–10.3)
Chloride: 103 mmol/L (ref 98–111)
Creatinine, Ser: 1.45 mg/dL — ABNORMAL HIGH (ref 0.61–1.24)
GFR calc Af Amer: 59 mL/min — ABNORMAL LOW (ref >60)
GFR calc non Af Amer: 51 mL/min — ABNORMAL LOW (ref >60)
Glucose, Bld: 224 mg/dL — ABNORMAL HIGH (ref 70–99)
Potassium: 4.1 mmol/L (ref 3.5–5.1)
Sodium: 135 mmol/L (ref 135–145)
Total Bilirubin: 0.8 mg/dL (ref 0.3–1.2)
Total Protein: 6.5 g/dL (ref 6.5–8.1)

## 2019-04-23 LAB — GLUCOSE, CAPILLARY: Glucose-Capillary: 160 mg/dL — ABNORMAL HIGH (ref 70–99)

## 2019-04-23 LAB — HEMOGLOBIN A1C
Hgb A1c MFr Bld: 7.3 % — ABNORMAL HIGH (ref 4.8–5.6)
Mean Plasma Glucose: 162.81 mg/dL

## 2019-04-23 LAB — SARS CORONAVIRUS 2 (TAT 6-24 HRS): SARS Coronavirus 2: NEGATIVE

## 2019-04-23 LAB — BRAIN NATRIURETIC PEPTIDE: B Natriuretic Peptide: 1155 pg/mL — ABNORMAL HIGH (ref 0.0–100.0)

## 2019-04-23 LAB — TSH: TSH: 4.363 u[IU]/mL (ref 0.350–4.500)

## 2019-04-23 LAB — MAGNESIUM: Magnesium: 1.7 mg/dL (ref 1.7–2.4)

## 2019-04-23 MED ORDER — INSULIN ASPART 100 UNIT/ML ~~LOC~~ SOLN
0.0000 [IU] | Freq: Three times a day (TID) | SUBCUTANEOUS | Status: DC
  Administered 2019-04-24 – 2019-04-25 (×2): 2 [IU] via SUBCUTANEOUS
  Administered 2019-04-25 – 2019-04-27 (×4): 1 [IU] via SUBCUTANEOUS

## 2019-04-23 MED ORDER — CARVEDILOL 3.125 MG PO TABS
3.1250 mg | ORAL_TABLET | Freq: Every day | ORAL | Status: DC
  Administered 2019-04-23 – 2019-04-26 (×4): 3.125 mg via ORAL
  Filled 2019-04-23 (×4): qty 1

## 2019-04-23 MED ORDER — ASPIRIN EC 81 MG PO TBEC
81.0000 mg | DELAYED_RELEASE_TABLET | Freq: Every day | ORAL | Status: DC
  Administered 2019-04-23 – 2019-04-24 (×2): 81 mg via ORAL
  Filled 2019-04-23 (×2): qty 1

## 2019-04-23 MED ORDER — INSULIN ASPART 100 UNIT/ML ~~LOC~~ SOLN: 0.0000 [IU] | Freq: Every day | SUBCUTANEOUS | Status: DC

## 2019-04-23 MED ORDER — FUROSEMIDE 10 MG/ML IJ SOLN
40.0000 mg | Freq: Once | INTRAMUSCULAR | Status: AC
  Administered 2019-04-23: 11:00:00 40 mg via INTRAVENOUS
  Filled 2019-04-23: qty 4

## 2019-04-23 MED ORDER — SIMVASTATIN 20 MG PO TABS
40.0000 mg | ORAL_TABLET | Freq: Every day | ORAL | Status: DC
  Administered 2019-04-23 – 2019-04-26 (×4): 40 mg via ORAL
  Filled 2019-04-23 (×4): qty 2

## 2019-04-23 MED ORDER — HEPARIN SODIUM (PORCINE) 5000 UNIT/ML IJ SOLN
5000.0000 [IU] | Freq: Three times a day (TID) | INTRAMUSCULAR | Status: DC
  Administered 2019-04-23 – 2019-04-25 (×5): 5000 [IU] via SUBCUTANEOUS
  Filled 2019-04-23 (×5): qty 1

## 2019-04-23 MED ORDER — GUAIFENESIN-DM 100-10 MG/5ML PO SYRP
5.0000 mL | ORAL_SOLUTION | ORAL | Status: DC | PRN
  Administered 2019-04-23: 16:00:00 5 mL via ORAL
  Filled 2019-04-23: qty 5

## 2019-04-23 MED ORDER — ACETAMINOPHEN 325 MG PO TABS: 650.0000 mg | ORAL_TABLET | ORAL | Status: DC | PRN

## 2019-04-23 MED ORDER — SODIUM CHLORIDE 0.9 % IV SOLN: 250.0000 mL | INTRAVENOUS | Status: DC | PRN

## 2019-04-23 MED ORDER — SODIUM CHLORIDE 0.9% FLUSH: 3.0000 mL | INTRAVENOUS | Status: DC | PRN

## 2019-04-23 MED ORDER — SODIUM CHLORIDE 0.9% FLUSH
3.0000 mL | Freq: Two times a day (BID) | INTRAVENOUS | Status: DC
  Administered 2019-04-23 – 2019-04-26 (×6): 3 mL via INTRAVENOUS

## 2019-04-23 MED ORDER — FUROSEMIDE 10 MG/ML IJ SOLN
20.0000 mg | Freq: Two times a day (BID) | INTRAMUSCULAR | Status: DC
  Administered 2019-04-23 – 2019-04-24 (×2): 20 mg via INTRAVENOUS
  Filled 2019-04-23 (×2): qty 2

## 2019-04-23 MED ORDER — OXYCODONE HCL 5 MG PO TABS
10.0000 mg | ORAL_TABLET | Freq: Three times a day (TID) | ORAL | Status: DC | PRN
  Administered 2019-04-25 – 2019-04-26 (×2): 10 mg via ORAL
  Filled 2019-04-23 (×2): qty 2

## 2019-04-23 MED ORDER — ONDANSETRON HCL 4 MG/2ML IJ SOLN: 4.0000 mg | Freq: Four times a day (QID) | INTRAMUSCULAR | Status: DC | PRN

## 2019-04-23 MED ORDER — GABAPENTIN 100 MG PO CAPS
200.0000 mg | ORAL_CAPSULE | Freq: Three times a day (TID) | ORAL | Status: DC
  Administered 2019-04-23 – 2019-04-27 (×11): 200 mg via ORAL
  Filled 2019-04-23 (×13): qty 2

## 2019-04-23 NOTE — Plan of Care (Signed)

## 2019-04-23 NOTE — ED Notes (Signed)
ED TO INPATIENT HANDOFF REPORT  ED Nurse Name and Phone #: [phone]  S Name/Age/Gender [name] [name] 64 y.o. male Room/Bed: APA18/APA18  Code Status   Code Status: Full Code  Home/SNF/Other Home Patient oriented to: self, place, time and situation Is this baseline? Yes   Triage Complete: Triage complete  Chief Complaint Acute on chronic systolic (congestive) heart failure (HCC) [I50.23]  Triage Note PT c/o SOB with exertion and laying flat with increased peripheral edema x3 weeks. PT states he went to PCP (Dr. Cindie Laroche) office this am and was told to come to ED for further eval. PT states he used to take a fluid pill but no longer takes it due to kidney function.     Allergies No Known Allergies  Level of Care/Admitting Diagnosis ED Disposition    ED Disposition Condition [facility] Area: [facility] [id number]  Level of Care: Telemetry [5]  Covid Evaluation: Asymptomatic Screening Protocol (No Symptoms)  Diagnosis: Acute on chronic systolic (congestive) heart failure [facility]) [id number]  Admitting Physician: Barton Dubois [[id number]]  Attending Physician: Barton Dubois [[id number]]  Estimated length of stay: past midnight tomorrow  Certification:: I certify this patient will need inpatient services for at least 2 midnights       B Medical/Surgery History Past Medical History:  Diagnosis Date  . Anxiety   . CHF (congestive heart failure) ([place])   . Chronic systolic heart failure ([place])   . Diabetes mellitus   . Fluid retention in legs   . Hypertension    Past Surgical History:  Procedure Laterality Date  . COLONOSCOPY  01/04/2012   Procedure: COLONOSCOPY;  Surgeon: Danie Binder, MD;  [facility];  Service: Endoscopy;  Laterality: N/A;  1:30 PM  . HERNIA REPAIR    . LEFT AND RIGHT HEART CATHETERIZATION WITH CORONARY ANGIOGRAM N/A 07/09/2011   Procedure: LEFT AND RIGHT HEART CATHETERIZATION WITH CORONARY ANGIOGRAM;  Surgeon: Peter M  Martinique, MD;  Location: [facility] CATH LAB;  Service: Cardiovascular;  Laterality: N/A;     A IV Location/Drains/Wounds Patient Lines/Drains/Airways Status   Active Line/Drains/Airways    Name:   Placement date:   Placement time:   Site:   Days:   Peripheral IV 04/23/19 Right;Lateral;Upper Antecubital   04/23/19    1015    Antecubital   less than 1          Intake/Output Last 24 hours No intake or output data in the 24 hours ending 04/23/19 1349  Labs/Imaging Results for orders placed or performed during the hospital encounter of 04/23/19 (from the past 48 hour(s))  CBC     Status: Abnormal   Collection Time: 04/23/19 10:20 AM  Result Value Ref Range   WBC 7.2 4.0 - 10.5 K/uL   RBC 3.57 (L) 4.22 - 5.81 MIL/uL   Hemoglobin 12.8 (L) 13.0 - 17.0 g/dL   HCT 39.3 39.0 - 52.0 %   MCV 110.1 (H) 80.0 - 100.0 fL   MCH 35.9 (H) 26.0 - 34.0 pg   MCHC 32.6 30.0 - 36.0 g/dL   RDW 12.8 11.5 - 15.5 %   Platelets 191 150 - 400 K/uL   nRBC 0.0 0.0 - 0.2 %    Comment: Performed at [facility], [address] [place], [place] [address]  Comprehensive metabolic panel     Status: Abnormal   Collection Time: 04/23/19 10:20 AM  Result Value Ref Range   Sodium 135 135 - 145 mmol/L  Potassium 4.1 3.5 - 5.1 mmol/L   Chloride 103 98 - 111 mmol/L   CO2 21 (L) 22 - 32 mmol/L   Glucose, Bld 224 (H) 70 - 99 mg/dL    Comment: Glucose reference range applies only to samples taken after fasting for at least 8 hours.   BUN 16 8 - 23 mg/dL   Creatinine, Ser 1.45 (H) 0.61 - 1.24 mg/dL   Calcium 8.6 (L) 8.9 - 10.3 mg/dL   Total Protein 6.5 6.5 - 8.1 g/dL   Albumin 3.5 3.5 - 5.0 g/dL   AST 44 (H) 15 - 41 U/L   ALT 66 (H) 0 - 44 U/L   Alkaline Phosphatase 100 38 - 126 U/L   Total Bilirubin 0.8 0.3 - 1.2 mg/dL   GFR calc non Af Amer 51 (L) >60 mL/min   GFR calc Af Amer 59 (L) >60 mL/min   Anion gap 11 5 - 15    Comment: Performed at [facility], [address], [place], [place] [address]  Brain natriuretic  peptide     Status: Abnormal   Collection Time: 04/23/19 10:20 AM  Result Value Ref Range   B Natriuretic Peptide 1,155.0 (H) 0.0 - 100.0 pg/mL    Comment: Performed at [facility], [address], [place], [place] [address]   DG Chest Port 1 View  Result Date: 04/23/2019 CLINICAL DATA:  Shortness of breath EXAM: PORTABLE CHEST 1 VIEW COMPARISON:  September 08, 2012 FINDINGS: There is a small pleural effusion on the right with equivocal pleural effusion on the left. No edema or airspace opacity evident. There is cardiomegaly with pulmonary venous hypertension. No adenopathy. No bone lesions. IMPRESSION: Cardiomegaly with pulmonary vascular congestion. Small right pleural effusion with equivocal left pleural effusion. Lungs otherwise clear. No adenopathy. Electronically Signed   By: Lowella Grip III M.D.   On: 04/23/2019 11:13    Pending Labs Unresulted Labs (From admission, onward)    Start     Ordered   04/24/19 [id number]  Basic metabolic panel  Daily,   R     04/23/19 1341   04/23/19 1338  Magnesium  Once,   STAT     04/23/19 1341   04/23/19 1338  TSH  Once,   STAT     04/23/19 1341   04/23/19 1334  HIV Antibody (routine testing w rflx)  (HIV Antibody (Routine testing w reflex) panel)  Once,   STAT     04/23/19 1341   04/23/19 1334  CBC  (heparin)  Once,   STAT    Comments: Baseline for heparin therapy IF NOT ALREADY DRAWN.  Notify MD if PLT < 100 K.    04/23/19 1341   04/23/19 1334  Creatinine, serum  (heparin)  Once,   STAT    Comments: Baseline for heparin therapy IF NOT ALREADY DRAWN.    04/23/19 1341   04/23/19 1310  SARS CORONAVIRUS 2 (TAT 6-24 HRS) Nasopharyngeal Nasopharyngeal Swab  (Tier 3 (TAT 6-24 hrs))  Once,   STAT    Question Answer Comment  Is this test for diagnosis or screening Screening   Symptomatic for COVID-19 as defined by CDC No   Hospitalized for COVID-19 No   Admitted to ICU for COVID-19 No   Previously tested for COVID-19 Yes   Resident in a congregate  (group) care setting Unknown   Employed in healthcare setting Unknown      04/23/19 1309          Vitals/Pain Today's  Vitals   04/23/19 1200 04/23/19 1230 04/23/19 1318 04/23/19 1330  BP: 100/86 (!) 105/93 (!) 108/91 (!) 113/93  Pulse:   73   Resp: (!) 22   15  Temp:      TempSrc:      SpO2:   94%   Weight:      Height:      PainSc:        Isolation Precautions No active isolations  Medications Medications  sodium chloride flush (NS) 0.9 % injection 3 mL (has no administration in time range)  sodium chloride flush (NS) 0.9 % injection 3 mL (has no administration in time range)  0.9 %  sodium chloride infusion (has no administration in time range)  acetaminophen (TYLENOL) tablet 650 mg (has no administration in time range)  ondansetron (ZOFRAN) injection 4 mg (has no administration in time range)  heparin injection 5,000 Units (has no administration in time range)  furosemide (LASIX) injection 20 mg (has no administration in time range)  aspirin EC tablet 81 mg (has no administration in time range)  furosemide (LASIX) injection 40 mg (40 mg Intravenous Given 04/23/19 1126)    Mobility walks Low fall risk   Focused Assessments    R Recommendations: See Admitting Provider Note  Report given to:   Additional Notes:

## 2019-04-23 NOTE — ED Provider Notes (Signed)
[facility] Emergency Department Provider Note MRN:  [id number]  Arrival date & time: 04/23/19     Chief Complaint   Shortness of Breath   History of Present Illness   [name] [name] is a 64 y.o. year-old male with a history of CHF presenting to the ED with chief complaint of shortness of breath.  3 weeks of dyspnea exertion, shortness of breath when laying flat, increasing lower extremity edema.  Denies chest pain.  Cough for 3 weeks.  No fever, no abdominal pain.  Symptoms constant, gradual onset.  Review of Systems  A complete 10 system review of systems was obtained and all systems are negative except as noted in the HPI and PMH.   Patient's Health History    Past Medical History:  Diagnosis Date  . Anxiety   . CHF (congestive heart failure) ([place])   . Chronic systolic heart failure ([place])   . Diabetes mellitus   . Fluid retention in legs   . Hypertension     Past Surgical History:  Procedure Laterality Date  . COLONOSCOPY  01/04/2012   Procedure: COLONOSCOPY;  Surgeon: Danie Binder, MD;  [facility];  Service: Endoscopy;  Laterality: N/A;  1:30 PM  . HERNIA REPAIR    . LEFT AND RIGHT HEART CATHETERIZATION WITH CORONARY ANGIOGRAM N/A 07/09/2011   Procedure: LEFT AND RIGHT HEART CATHETERIZATION WITH CORONARY ANGIOGRAM;  Surgeon: Peter M Martinique, MD;  Location: [facility] CATH LAB;  Service: Cardiovascular;  Laterality: N/A;    Family History  Problem Relation Age of Onset  . Heart attack Father   . Heart failure Father   . Cancer Mother        Multiple myeloma  . Heart disease Sister     Social History   Socioeconomic History  . Marital status: Married    Spouse name: Not on file  . Number of children: 0  . Years of education: Not on file  . Highest education level: Not on file  Occupational History  . Occupation: disabled  Tobacco Use  . Smoking status: Former Smoker    Packs/day: 0.50    Types: Cigarettes, E-cigarettes   Start date: 10/13/1982    Quit date: 09/19/2011    Years since quitting: 7.5  . Smokeless tobacco: Never Used  . Tobacco comment: electronic cig for 4 months   Substance and Sexual Activity  . Alcohol use: Yes    Comment: weekends  . Drug use: No  . Sexual activity: Not on file  Other Topics Concern  . Not on file  Social History Narrative   Patient is right handed.   Patient seldom drinks caffeine.   Social Determinants of Health   Financial Resource Strain:   . Difficulty of Paying Living Expenses:   Food Insecurity:   . Worried About Charity fundraiser in the Last Year:   . Arboriculturist in the Last Year:   Transportation Needs:   . Film/video editor (Medical):   Marland Kitchen Lack of Transportation (Non-Medical):   Physical Activity:   . Days of Exercise per Week:   . Minutes of Exercise per Session:   Stress:   . Feeling of Stress :   Social Connections:   . Frequency of Communication with Friends and Family:   . Frequency of Social Gatherings with Friends and Family:   . Attends Religious Services:   . Active Member of Clubs or Organizations:   . Attends Archivist  Meetings:   Marland Kitchen Marital Status:   Intimate Partner Violence:   . Fear of Current or Ex-Partner:   . Emotionally Abused:   Marland Kitchen Physically Abused:   . Sexually Abused:      Physical Exam   Vitals:   04/23/19 1330 04/23/19 1430  BP: (!) 113/93 101/84  Pulse:    Resp: 15   Temp:    SpO2:      CONSTITUTIONAL: Well-appearing, NAD NEURO:  Alert and oriented x 3, no focal deficits EYES:  eyes equal and reactive ENT/NECK:  no LAD, no JVD CARDIO: Regular rate, well-perfused, normal S1 and S2 PULM:  CTAB no wheezing or rhonchi GI/GU:  normal bowel sounds, non-distended, non-tender MSK/SPINE:  No gross deformities, no edema SKIN:  no rash, atraumatic PSYCH:  Appropriate speech and behavior  *Additional and/or pertinent findings included in MDM below  Diagnostic and Interventional Summary     EKG Interpretation  Date/Time:  Monday April 23 2019 10:13:40 EDT Ventricular Rate:  93 PR Interval:    QRS Duration: 132 QT Interval:  416 QTC Calculation: 518 R Axis:   -79 Text Interpretation: Sinus rhythm IVCD, consider atypical RBBB Nonspecific T abnormalities, lateral leads Confirmed by Gerlene Fee [phone]) on 04/23/2019 10:36:25 AM      Labs Reviewed  CBC - Abnormal; Notable for the following components:      Result Value   RBC 3.57 (*)    Hemoglobin 12.8 (*)    MCV 110.1 (*)    MCH 35.9 (*)    All other components within normal limits  COMPREHENSIVE METABOLIC PANEL - Abnormal; Notable for the following components:   CO2 21 (*)    Glucose, Bld 224 (*)    Creatinine, Ser 1.45 (*)    Calcium 8.6 (*)    AST 44 (*)    ALT 66 (*)    GFR calc non Af Amer 51 (*)    GFR calc Af Amer 59 (*)    All other components within normal limits  BRAIN NATRIURETIC PEPTIDE - Abnormal; Notable for the following components:   B Natriuretic Peptide 1,155.0 (*)    All other components within normal limits  TROPONIN I (HIGH SENSITIVITY) - Abnormal; Notable for the following components:   Troponin I (High Sensitivity) 42 (*)    All other components within normal limits  SARS CORONAVIRUS 2 (TAT 6-24 HRS)  MAGNESIUM  TSH  HIV ANTIBODY (ROUTINE TESTING W REFLEX)  TROPONIN I (HIGH SENSITIVITY)    DG Chest Port 1 View  Final Result      Medications  sodium chloride flush (NS) 0.9 % injection 3 mL (has no administration in time range)  sodium chloride flush (NS) 0.9 % injection 3 mL (has no administration in time range)  0.9 %  sodium chloride infusion (has no administration in time range)  acetaminophen (TYLENOL) tablet 650 mg (has no administration in time range)  ondansetron (ZOFRAN) injection 4 mg (has no administration in time range)  heparin injection 5,000 Units (has no administration in time range)  furosemide (LASIX) injection 20 mg (has no administration in time range)  aspirin  EC tablet 81 mg (has no administration in time range)  furosemide (LASIX) injection 40 mg (40 mg Intravenous Given 04/23/19 1126)     Procedures  /  Critical Care Procedures  ED Course and Medical Decision Making  I have reviewed the triage vital signs, the nursing notes, and pertinent available records from the EMR.  Pertinent labs & imaging results  that were available during my care of the patient were reviewed by me and considered in my medical decision making (see below for details).     Suspect CHF exacerbation, well-appearing, normal vital signs, suspect he could be managed as an outpatient, work-up pending.  Work-up reveals elevated BNP consistent with CHF exacerbation.  Patient is with some soft blood pressures, continues to be tachypneic, did not produce much urine with Lasix bolus.  Admitted to hospitalist service for further care.  Barth Kirks. Sedonia Small, [email or web address] [email or web address]  Final Clinical Impressions(s) / ED Diagnoses     ICD-10-CM   1. Congestive heart failure, unspecified HF chronicity, unspecified heart failure type ([place])  I50.9     ED Discharge Orders    None       Discharge Instructions Discussed with and Provided to Patient:   Discharge Instructions   None       Maudie Flakes, MD 04/23/19 1505

## 2019-04-23 NOTE — H&P (Signed)
History and Physical    RAYN [name] RDE:[id number] DOB: 1955-12-10 DOA: 04/23/2019  Referring MD/NP/PA: Dr. Sedonia Small PCP: Lucia Gaskins, MD  Patient coming from: home  Chief Complaint: Increased swelling, orthopnea, shortness of breath.  HPI: [name] [name] is a 64 y.o. male with a past medical history significant for chronic systolic heart failure, hypertension, hyperlipidemia, type 2 diabetes with nephropathy, chronic kidney disease a stage IIIa, anxiety, chronic pain and diabetic neuropathy; who presented to the hospital secondary to approximately 3 weeks of worsening swelling and associated shortness of breath.  Patient reports symptoms worsening in this timeframe with association of dyspnea on exertion, orthopnea and increased lower extremity swelling.  He reports to be compliant with medications; but expressed recently being told not to take some of his diuretics medications due to renal dysfunction.  He has no follow-up with cardiology in over 4 years or so. Patient denies chest pain, fever, productive cough, dysuria, hematuria, melena, hematochezia, focal weakness, nausea, vomiting headaches, blurred vision or any other complaints.  In the ED chest x-ray demonstrated interstitial edema with vascular congestion, physical exam demonstrating lower extremity swelling (2+ edema), elevated BNP and a creatinine level of 1.45.  Patient received a dose of Lasix which was minimal improvement in his symptoms and is still being very tachypneic with minimal exertion.  TRH was consulted to admit patient for further evaluation and management of acute on chronic CHF.   Past Medical/Surgical History: Past Medical History:  Diagnosis Date  . Anxiety   . CHF (congestive heart failure) ([place])   . Chronic systolic heart failure ([place])   . Diabetes mellitus   . Fluid retention in legs   . Hypertension     Past Surgical History:  Procedure Laterality Date  . COLONOSCOPY  01/04/2012   Procedure:  COLONOSCOPY;  Surgeon: [name] Binder, MD;  Location: [facility];  Service: Endoscopy;  Laterality: N/A;  1:30 PM  . HERNIA REPAIR    . LEFT AND RIGHT HEART CATHETERIZATION WITH CORONARY ANGIOGRAM N/A 07/09/2011   Procedure: LEFT AND RIGHT HEART CATHETERIZATION WITH CORONARY ANGIOGRAM;  Surgeon: [name] M Martinique, MD;  Location: [facility] CATH LAB;  Service: Cardiovascular;  Laterality: N/A;    Social History:  reports that he quit smoking about 7 years ago. His smoking use included cigarettes and e-cigarettes. He started smoking about 36 years ago. He smoked 0.50 packs per day. He has never used smokeless tobacco. He reports current alcohol use. He reports that he does not use drugs.  Allergies: No Known Allergies  Family History:  Family History  Problem Relation Age of Onset  . Heart attack Father   . Heart failure Father   . Cancer Mother        Multiple myeloma  . Heart disease Sister     Prior to Admission medications   Medication Sig Start Date End Date Taking? Authorizing Provider  carvedilol (COREG) 6.25 MG tablet Take 6.25 mg by mouth at bedtime. 02/20/19  Yes [provider]  gabapentin (NEURONTIN) 300 MG capsule Take 300 mg by mouth 3 (three) times daily. 04/09/19  Yes [provider]  metFORMIN (GLUCOPHAGE) 500 MG tablet Take 500 mg by mouth 2 (two) times daily. 04/20/19  Yes [provider]  naproxen (NAPROSYN) 500 MG tablet Take 500 mg by mouth 2 (two) times daily. 02/28/19  Yes [provider]  Oxycodone HCl 10 MG TABS Take 10 mg by mouth 4 (four) times daily as needed. 04/11/19  Yes [provider]  simvastatin (ZOCOR) 40 MG tablet Take 1 tablet (40 mg total) by mouth at bedtime. 08/15/14  Yes [name], [name] Eng, PA-C  spironolactone (ALDACTONE) 25 MG tablet TAKE 1/2 TABLET BY MOUTH DAILY. 05/20/15  Yes Branch, [name] Guild, MD  ALPRAZolam [name] [name]) 1 MG tablet Take 1 tablet (1 mg total) by mouth 2 (two) times daily as needed for anxiety. Patient  not taking: Reported on 04/23/2019 08/14/14   [name], [name] Eng, PA-C  busPIRone (BUSPAR) 10 MG tablet Take 1 tablet (10 mg total) by mouth 2 (two) times daily. Patient not taking: Reported on 04/23/2019 08/14/14   [name], [name] Eng, PA-C  ketoconazole (NIZORAL) 2 % shampoo Apply 1 application topically 2 (two) times a week. Patient not taking: Reported on 04/23/2019 08/14/14   [name], [name] Eng, PA-C  losartan (COZAAR) 25 MG tablet Take 0.5 tablets (12.5 mg total) by mouth daily. Patient not taking: Reported on 04/23/2019 08/15/14   [name], [name] Eng, PA-C  torsemide [facility]) 20 MG tablet May take 20 mg extra daily for shortness of breath or leg swelling Patient not taking: Reported on 04/23/2019 10/18/14   [name] Commons, MD    Review of Systems:  Negative except as otherwise mentioned in HPI.  Physical Exam: Vitals:   04/23/19 1318 04/23/19 1330 04/23/19 1430 04/23/19 1509  BP: (!) 108/91 (!) 113/93 101/84 100/78  Pulse: 73   71  Resp:  15  18  Temp:    (!) 97.5 F (36.4 C)  TempSrc:    Oral  SpO2: 94%   100%  Weight:      Height:       Constitutional: Complaining of lower extremity swelling, orthopnea and shortness of breath on exertion.  Denies chest pain.  No nausea, no vomiting.  Patient is afebrile.  Eyes: PERRL, lids and conjunctivae normal ENMT: Mucous membranes are moist. Posterior pharynx clear of any exudate or lesions. Neck: normal, supple, no masses, no thyromegaly, no JVD appreciated on exam. Respiratory: Decreased breath sounds at the bases; fine crackles auscultated.  No using accessory muscles; positive tachypnea with minimal exertion. Cardiovascular: Regular rate, no murmurs / rubs or gallops.  2+ edema lower extremity bilaterally. No carotid bruits.  Abdomen: no tenderness, no masses palpated. No hepatosplenomegaly. Bowel sounds positive.  Musculoskeletal: no clubbing / cyanosis. No joint deformity upper and lower extremities. Good ROM, no contractures. Normal muscle  tone.  Skin: no rashes, lesions, ulcers.  No petechiae. Neurologic: CN 2-12 grossly intact. Sensation intact, DTR normal. Strength 5/5 in all 4.  Psychiatric: Normal judgment and insight. Alert and oriented x 3. Normal mood.    Labs on Admission: I have personally reviewed the following labs and imaging studies  CBC: Recent Labs  Lab 04/23/19 1020  WBC 7.2  HGB 12.8*  HCT 39.3  MCV 110.1*  PLT 324   Basic Metabolic Panel: Recent Labs  Lab 04/23/19 1020 04/23/19 1334  NA 135  --   K 4.1  --   CL 103  --   CO2 21*  --   GLUCOSE 224*  --   BUN 16  --   CREATININE 1.45*  --   CALCIUM 8.6*  --   MG  --  1.7   GFR: Estimated Creatinine Clearance: 65.7 mL/min (A) (by C-G formula based on SCr of 1.45 mg/dL (H)).   Liver Function Tests: Recent Labs  Lab 04/23/19 1020  AST 44*  ALT 66*  ALKPHOS 100  BILITOT 0.8  PROT 6.5  ALBUMIN  3.5   Thyroid Function Tests: Recent Labs    04/23/19 1020  TSH 4.363   Urine analysis:    Component Value Date/Time   COLORURINE YELLOW 06/28/2006 1102   APPEARANCEUR CLEAR 06/28/2006 1102   LABSPEC 1.020 06/28/2006 1102   PHURINE 6.0 06/28/2006 1102   GLUCOSEU NEGATIVE 06/28/2006 1102   HGBUR NEGATIVE 06/28/2006 1102   BILIRUBINUR NEGATIVE 06/28/2006 1102   KETONESUR NEGATIVE 06/28/2006 1102   PROTEINUR NEGATIVE 06/28/2006 1102   UROBILINOGEN 1.0 06/28/2006 1102   NITRITE NEGATIVE 06/28/2006 1102   LEUKOCYTESUR  06/28/2006 1102    NEGATIVE MICROSCOPIC NOT DONE ON URINES WITH NEGATIVE PROTEIN, BLOOD, LEUKOCYTES, NITRITE, OR GLUCOSE <1000 mg/dL.    Radiological Exams on Admission: DG Chest Port 1 View  Result Date: 04/23/2019 CLINICAL DATA:  Shortness of breath EXAM: PORTABLE CHEST 1 VIEW COMPARISON:  September 08, 2012 FINDINGS: There is a small pleural effusion on the right with equivocal pleural effusion on the left. No edema or airspace opacity evident. There is cardiomegaly with pulmonary venous hypertension. No adenopathy. No  bone lesions. IMPRESSION: Cardiomegaly with pulmonary vascular congestion. Small right pleural effusion with equivocal left pleural effusion. Lungs otherwise clear. No adenopathy. Electronically Signed   By: Lowella Grip III M.D.   On: 04/23/2019 11:13    EKG: Independently reviewed.  Rate controlled, nonspecific T wave abnormalities in multiple leads.  Normal axis.  Assessment/Plan 1-Acute on chronic systolic (congestive) heart failure (HCC) -Patient with shortness of breath, increased swelling and elevated BNP on presentation. -Chest x-ray demonstrating interstitial edema and vascular congestion. -Admitted to telemetry. -Cardiology service has been consulted -Repeat 2D echo to assess ejection fraction and heart structure. -Started on IV Lasix every 12 hours -Closely follow electrolytes and renal function -Low-sodium diet, daily weights and strict I's and O's to be followed. -Spironolactone and losartan on hold in the setting of acute CHF exacerbation, soft blood pressure and renal insufficiency. -Continue low-dose beta-blocker. -checking troponin and TSH.  2-hypertension -Currently with supple pressure on presentation -Continue to follow vital signs -Medication regimen has been adjusted to prevent significant low BP.  3-hyperlipidemia -Continue statins  4-type 2 diabetes with nephropathy -Hold oral hypoglycemic agents while inpatient -Check A1c -Started on sliding scale insulin. -Follow CBGs. -Modified carbohydrate diet has been ordered.  5-diabetic neuropathy -continue Neurontin  6-chronic pain -continue home oxycodone PRN  7-HLD -continue statins.   8-chronic renal failure: Stage IIIa at baseline -will closely follow renal function trend and electrolytes -Minimize/avoid the use of nephrotoxic agents. -Avoid sustained hypotension.  DVT prophylaxis: Heparin. Code Status: Full code Family Communication: No family at bedside. Disposition Plan: To be determined;  but anticipate discharge back home once volume status and breathing is stabilized. Consults called: Cardiology service. Admission status: Inpatient, level of stay more than 2 midnights; telemetry bed.   Time Spent: 70 minutes.  Barton Dubois MD [facility] Pager [phone]  04/23/2019, 4:55 PM

## 2019-04-23 NOTE — ED Triage Notes (Signed)
PT c/o SOB with exertion and laying flat with increased peripheral edema x3 weeks. PT states he went to PCP (Dr. Cindie Laroche) office this am and was told to come to ED for further eval. PT states he used to take a fluid pill but no longer takes it due to kidney function.

## 2019-04-24 ENCOUNTER — Inpatient Hospital Stay (HOSPITAL_COMMUNITY): Payer: Medicare PPO

## 2019-04-24 ENCOUNTER — Encounter (HOSPITAL_COMMUNITY): Payer: Self-pay | Admitting: Internal Medicine

## 2019-04-24 DIAGNOSIS — I4891 Unspecified atrial fibrillation: Secondary | ICD-10-CM

## 2019-04-24 DIAGNOSIS — E785 Hyperlipidemia, unspecified: Secondary | ICD-10-CM | POA: Diagnosis not present

## 2019-04-24 DIAGNOSIS — I10 Essential (primary) hypertension: Secondary | ICD-10-CM

## 2019-04-24 DIAGNOSIS — I5021 Acute systolic (congestive) heart failure: Secondary | ICD-10-CM

## 2019-04-24 DIAGNOSIS — N1831 Chronic kidney disease, stage 3a: Secondary | ICD-10-CM | POA: Diagnosis not present

## 2019-04-24 DIAGNOSIS — I5023 Acute on chronic systolic (congestive) heart failure: Secondary | ICD-10-CM

## 2019-04-24 DIAGNOSIS — N183 Chronic kidney disease, stage 3 unspecified: Secondary | ICD-10-CM

## 2019-04-24 DIAGNOSIS — G8929 Other chronic pain: Secondary | ICD-10-CM | POA: Diagnosis not present

## 2019-04-24 DIAGNOSIS — I513 Intracardiac thrombosis, not elsewhere classified: Secondary | ICD-10-CM

## 2019-04-24 LAB — GLUCOSE, CAPILLARY
Glucose-Capillary: 134 mg/dL — ABNORMAL HIGH (ref 70–99)
Glucose-Capillary: 237 mg/dL — ABNORMAL HIGH (ref 70–99)
Glucose-Capillary: 129 mg/dL — ABNORMAL HIGH (ref 70–99)
Glucose-Capillary: 142 mg/dL — ABNORMAL HIGH (ref 70–99)

## 2019-04-24 LAB — BASIC METABOLIC PANEL
Anion gap: 10 (ref 5–15)
BUN: 17 mg/dL (ref 8–23)
CO2: 25 mmol/L (ref 22–32)
Calcium: 8.4 mg/dL — ABNORMAL LOW (ref 8.9–10.3)
Chloride: 104 mmol/L (ref 98–111)
Creatinine, Ser: 1.4 mg/dL — ABNORMAL HIGH (ref 0.61–1.24)
GFR calc Af Amer: >60 mL/min (ref >60)
GFR calc non Af Amer: 53 mL/min — ABNORMAL LOW (ref >60)
Glucose, Bld: 117 mg/dL — ABNORMAL HIGH (ref 70–99)
Potassium: 3.7 mmol/L (ref 3.5–5.1)
Sodium: 139 mmol/L (ref 135–145)

## 2019-04-24 LAB — PROTIME-INR
INR: 1 (ref 0.8–1.2)
Prothrombin Time: 13.2 seconds (ref 11.4–15.2)

## 2019-04-24 LAB — HIV ANTIBODY (ROUTINE TESTING W REFLEX): HIV Screen 4th Generation wRfx: NONREACTIVE

## 2019-04-24 LAB — ECHOCARDIOGRAM COMPLETE
Height: 72 in
Weight: 3569.69 oz

## 2019-04-24 MED ORDER — WARFARIN SODIUM 5 MG PO TABS
10.0000 mg | ORAL_TABLET | Freq: Once | ORAL | Status: AC
  Administered 2019-04-24: 16:00:00 10 mg via ORAL
  Filled 2019-04-24: qty 2

## 2019-04-24 MED ORDER — WARFARIN - PHARMACIST DOSING INPATIENT: Freq: Every day | Status: DC

## 2019-04-24 MED ORDER — MAGNESIUM SULFATE 2 GM/50ML IV SOLN
2.0000 g | Freq: Once | INTRAVENOUS | Status: AC
  Administered 2019-04-24: 2 g via INTRAVENOUS
  Filled 2019-04-24: qty 50

## 2019-04-24 MED ORDER — TRAZODONE HCL 50 MG PO TABS
50.0000 mg | ORAL_TABLET | Freq: Every evening | ORAL | Status: DC | PRN
  Administered 2019-04-25: 50 mg via ORAL
  Filled 2019-04-24: qty 1

## 2019-04-24 MED ORDER — TRAZODONE HCL 50 MG PO TABS
50.0000 mg | ORAL_TABLET | Freq: Every evening | ORAL | Status: AC | PRN
  Administered 2019-04-24: 01:00:00 50 mg via ORAL
  Filled 2019-04-24: qty 1

## 2019-04-24 MED ORDER — FUROSEMIDE 10 MG/ML IJ SOLN
40.0000 mg | Freq: Two times a day (BID) | INTRAMUSCULAR | Status: DC
  Administered 2019-04-24 – 2019-04-26 (×4): 40 mg via INTRAVENOUS
  Filled 2019-04-24 (×4): qty 4

## 2019-04-24 NOTE — Progress Notes (Signed)
*  PRELIMINARY RESULTS* Echocardiogram 2D Echocardiogram has been performed.  [name] [name] 04/24/2019, 10:09 AM

## 2019-04-24 NOTE — TOC Initial Note (Signed)
Transition of Care [facility]) - Initial/Assessment Note    Patient Details  Name: [name] [name] MRN: [id number] Date of Birth: 04-20-1955  Transition of Care [place] [facility]) CM/SW Contact:    Sherie Don, LCSW Phone Number: 04/24/2019, 6:11 PM  Clinical Narrative: CSW met patient to complete initial assessment. Patient reported he lives at home with his wife, Neiman Roots, and stepson and he is able to complete all of his ADLs at home independently. Patient reported he does not have any DME at home and has never received HH before, but stated "it might would help" if recommended.  CSW asked CHF-related questions. Patient reported he does not complete daily weigh-ins, has not monitored his salt intake, and is not currently restricting fluid intake. Patient also reported he has difficulty breathing at night and cannot sleep lying down so he sleeps on his couch. Patient thinks his PCP was in the process of scheduling a sleep study, but was unsure. TOC to follow.  Expected Discharge Plan: [place] Barriers to Discharge: Continued Medical Work up  Patient Goals and CMS Choice Patient states their goals for this hospitalization and ongoing recovery are:: Discharge home CMS Medicare.gov Compare Post Acute Care list provided to:: Patient Choice offered to / list presented to : Patient  Expected Discharge Plan and Services Expected Discharge Plan: [place] Choice: [place] arrangements for the past 2 months: Single Family Home  Prior Living Arrangements/Services Living arrangements for the past 2 months: Single Family Home Lives with:: Spouse Patient language and need for interpreter reviewed:: Yes Do you feel safe going back to the place where you live?: Yes      Need for Family Participation in Patient Care: No (Comment) Care giver support system in place?: Yes (comment)(Theon Pollack (wife): [phone])   Criminal Activity/Legal  Involvement Pertinent to Current Situation/Hospitalization: No - Comment as needed  Activities of Daily Living Home Assistive Devices/Equipment: None ADL Screening (condition at time of admission) Patient's cognitive ability adequate to safely complete daily activities?: Yes Is the patient deaf or have difficulty hearing?: No Does the patient have difficulty seeing, even when wearing glasses/contacts?: No Does the patient have difficulty concentrating, remembering, or making decisions?: No Patient able to express need for assistance with ADLs?: No Does the patient have difficulty dressing or bathing?: No Independently performs ADLs?: Yes (appropriate for developmental age) Does the patient have difficulty walking or climbing stairs?: No Weakness of Legs: None Weakness of Arms/Hands: None  Permission Sought/Granted   Emotional Assessment Appearance:: Appears stated age Attitude/Demeanor/Rapport: Engaged Affect (typically observed): Accepting Orientation: : Oriented to Self, Oriented to Place, Oriented to  Time, Oriented to Situation  Admission diagnosis:  Acute on chronic systolic (congestive) heart failure (HCC) [I50.23] Congestive heart failure, unspecified HF chronicity, unspecified heart failure type [facility]) [I50.9] Patient Active Problem List   Diagnosis Date Noted  . Acute on chronic systolic (congestive) heart failure ([place]) 04/23/2019  . Chronic pain 04/23/2019  . Chronic renal failure, stage 3a 04/23/2019  . Generalized anxiety disorder 08/14/2014  . Diabetes mellitus type 2, uncontrolled ([place]) 08/14/2014  . Hyperlipidemia 08/14/2014  . Onychomycosis 08/14/2014  . Hypertrophic toenail 08/14/2014  . Tinea versicolor 08/14/2014  . Hepatomegaly 08/14/2014  . Pain in joint, upper arm 03/06/2014  . Cardiomyopathy, dilated, nonischemic ([place]) 10/12/2013  . Chronic systolic heart failure ([place]) 07/08/2011  . Type 2 diabetes with nephropathy ([place]) 07/08/2011  . Hypertension  07/08/2011   PCP:  Lucia Gaskins, MD Pharmacy:   [facility], [address] AT [address] [place]. [place] [address] Phone: [phone] Fax: [phone]  [facility], [address] [place] [address] Phone: [phone] Fax: [phone]  Readmission Risk Interventions No flowsheet data found.

## 2019-04-24 NOTE — Plan of Care (Signed)
Nutrition Education Note  RD consulted for nutrition education regarding new onset CHF.  RD provided "Low Sodium Nutrition Therapy" handout from the Academy of [employer] and Dietetics. Reviewed patient's dietary recall. Provided examples on ways to decrease sodium intake in diet. Discouraged intake of processed foods and use of salt shaker. Encouraged fresh fruits and vegetables as well as whole grain sources of carbohydrates to maximize fiber intake.   RD discussed why it is important for patient to adhere to diet recommendations, and emphasized the role of fluids, foods to avoid, and importance of weighing self daily. Teach back method used.  Expect fair compliance.  Body mass index is 30.26 kg/m. Pt meets criteria for overweight based on current BMI.  Current diet order is HH/CM, patient is consuming approximately 75% of meals at this time. Labs and medications reviewed. No further nutrition interventions warranted at this time. RD contact information provided. If additional nutrition issues arise, please re-consult RD.   Lajuan Lines, RD, LDN Clinical Nutrition After Hours/Weekend Pager # in [place]

## 2019-04-24 NOTE — Consult Note (Addendum)
Cardiology Consult    Patient ID: MAHKI SPIKES; [id number]; [birth year]   Admit date: 04/23/2019 Date of Consult: 04/24/2019  Primary Care Provider: Lucia Gaskins, MD Primary Cardiologist: Kate Sable, MD (last office visit in 04/2014)  Patient Profile    [name] [name] is a 64 y.o. male with past medical history of chronic combined systolic and diastolic CHF/NICM (EF 72% in 2013 with cath showing normal cors, EF 30-35% by repeat echo in 2015), HTN, HLD, Type 2 DM and Stage 3 CKD who is being seen today for the evaluation of CHF at the request of Dr. Dyann Kief.   History of Present Illness    [name] [name] was last examined by Dr. Bronson Ing in 04/2014 and denied any chest pain, palpitations or dyspnea at that time. He had previously met with Dr. Lovena Le to discuss ICD placement but did not wish to undergo the procedure at that time. Losartan was reduced to 12.14m daily given fatigue and he was continued on Coreg 12.561mBID, Spironolactone 12.24m89maily and Coreg 12.24mg68mD.   [facility] of worsening dyspnea on exertion, orthopnea, abdominal distension and lower extremity edema for the past 3 weeks. Denies any associated chest pain or palpitations. Has been sleeping in the recliner most nights. Reports a baseline weight less than 220 lbs but does not weigh himself daily. Does not add extra salt to his food but does get take-out from local restaurants a few times each week. He reports his PCP stopped his fluid pill approximately 1 year ago due to him not needing it anymore and due to the risk of stressing his kidneys. He has been continued on Coreg 6.224mg73m, Spironolactone 12.24mg d2my and Losartan 12.24mg da22m.   Initial labs show WBC 7.2, Hgb 12.8, platelets 191, Na+ 135, K+ 4.1 and creatinine 1.45 (no recent values available for comparison). Mg 1.7. BNP 1155. HS Troponin values flat at 42 and 38. TSH 4.363. Hgb A1c 7.3. COVID negative.  CXR showed cardiomegaly with pulmonary vascular congestion and small right pleural effusion. EKG showed rate-controlled atrial fibrillation, HR 93 with RBBB.   He was started on IV Lasix 20mg BI27m admission (also received 40mg whi54mn the ED) with a recorded output of -3.4L thus far. Weight down 5 lbs (228 --> 223 lbs). Follow-up labs this AM show creatinine has improved to 1.40.   Past Medical History:  Diagnosis Date  . Anxiety   . CHF (congestive heart failure) (HCC)    [place] 15[place] in 2013 with cath showing normal cors b. EF 30-35% by repeat echo in 2015  . Chronic systolic heart failure (HCC)   . [place] mellitus   . Fluid retention in legs   . Hypertension     Past Surgical History:  Procedure Laterality Date  . COLONOSCOPY  01/04/2012   Procedure: COLONOSCOPY;  Surgeon: Sandi L FDanie Bindercation: [facility] ENDO SUITE;  Service: Endoscopy;  Laterality: N/A;  1:30 PM  . HERNIA REPAIR    . LEFT AND RIGHT HEART CATHETERIZATION WITH CORONARY ANGIOGRAM N/A 07/09/2011   Procedure: LEFT AND RIGHT HEART CATHETERIZATION WITH CORONARY ANGIOGRAM;  Surgeon: Peter M Jordan, MMartiniquecation: [facility];  Service: Cardiovascular;  Laterality: N/A;     Home Medications:  Prior to Admission medications   Medication Sig Start Date End Date Taking? Authorizing Provider  carvedilol (COREG) 6.25 MG tablet Take 6.25 mg by mouth at bedtime. 02/20/19  Yes [provider]  gabapentin (NEURONTIN) 300 MG capsule Take 300 mg by mouth 3 (three) times daily. 04/09/19  Yes [provider]  metFORMIN (GLUCOPHAGE) 500 MG tablet Take 500 mg by mouth 2 (two) times daily. 04/20/19  Yes [provider]  naproxen (NAPROSYN) 500 MG tablet Take 500 mg by mouth 2 (two) times daily. 02/28/19  Yes [provider]  Oxycodone HCl 10 MG TABS Take 10 mg by mouth 4 (four) times daily as needed. 04/11/19  Yes [provider]  simvastatin (ZOCOR) 40 MG tablet Take 1 tablet (40 mg total) by  mouth at bedtime. 08/15/14  Yes Tysinger, Camelia Eng, PA-C  spironolactone (ALDACTONE) 25 MG tablet TAKE 1/2 TABLET BY MOUTH DAILY. 05/20/15  Yes Maynard David, Alphonse Guild, MD  ALPRAZolam Duanne Moron) 1 MG tablet Take 1 tablet (1 mg total) by mouth 2 (two) times daily as needed for anxiety. Patient not taking: Reported on 04/23/2019 08/14/14   Tysinger, Camelia Eng, PA-C  busPIRone (BUSPAR) 10 MG tablet Take 1 tablet (10 mg total) by mouth 2 (two) times daily. Patient not taking: Reported on 04/23/2019 08/14/14   Tysinger, Camelia Eng, PA-C  ketoconazole (NIZORAL) 2 % shampoo Apply 1 application topically 2 (two) times a week. Patient not taking: Reported on 04/23/2019 08/14/14   Tysinger, Camelia Eng, PA-C  losartan (COZAAR) 25 MG tablet Take 0.5 tablets (12.5 mg total) by mouth daily. Patient not taking: Reported on 04/23/2019 08/15/14   Carlena Hurl, PA-C  torsemide [facility]) 20 MG tablet May take 20 mg extra daily for shortness of breath or leg swelling Patient not taking: Reported on 04/23/2019 10/18/14   Herminio Commons, MD    Inpatient Medications: Scheduled Meds: . aspirin EC  81 mg Oral Daily  . carvedilol  3.125 mg Oral QHS  . furosemide  40 mg Intravenous Q12H  . gabapentin  200 mg Oral TID  . heparin  5,000 Units Subcutaneous Q8H  . insulin aspart  0-5 Units Subcutaneous QHS  . insulin aspart  0-6 Units Subcutaneous TID WC  . simvastatin  40 mg Oral QHS  . sodium chloride flush  3 mL Intravenous Q12H   Continuous Infusions: . sodium chloride     PRN Meds: sodium chloride, acetaminophen, guaiFENesin-dextromethorphan, ondansetron (ZOFRAN) IV, oxyCODONE, sodium chloride flush  Allergies:   No Known Allergies  Social History:   Social History   Socioeconomic History  . Marital status: Married    Spouse name: Not on file  . Number of children: 0  . Years of education: Not on file  . Highest education level: Not on file  Occupational History  . Occupation: disabled  Tobacco Use  . Smoking status:  Former Smoker    Packs/day: 0.50    Types: Cigarettes, E-cigarettes    Start date: 10/13/1982    Quit date: 09/19/2011    Years since quitting: 7.6  . Smokeless tobacco: Never Used  . Tobacco comment: electronic cig for 4 months   Substance and Sexual Activity  . Alcohol use: Yes    Comment: weekends  . Drug use: No  . Sexual activity: Not on file  Other Topics Concern  . Not on file  Social History Narrative   Patient is right handed.   Patient seldom drinks caffeine.   Social Determinants of Health   Financial Resource Strain:   . Difficulty of Paying Living Expenses:   Food Insecurity:   . Worried About Charity fundraiser in the Last Year:   .  Ran Out of Food in the Last Year:   Transportation Needs:   . Film/video editor (Medical):   Marland Kitchen Lack of Transportation (Non-Medical):   Physical Activity:   . Days of Exercise per Week:   . Minutes of Exercise per Session:   Stress:   . Feeling of Stress :   Social Connections:   . Frequency of Communication with Friends and Family:   . Frequency of Social Gatherings with Friends and Family:   . Attends Religious Services:   . Active Member of Clubs or Organizations:   . Attends Archivist Meetings:   Marland Kitchen Marital Status:   Intimate Partner Violence:   . Fear of Current or Ex-Partner:   . Emotionally Abused:   Marland Kitchen Physically Abused:   . Sexually Abused:      Family History:    Family History  Problem Relation Age of Onset  . Heart attack Father   . Heart failure Father   . Cancer Mother        Multiple myeloma  . Heart disease Sister       Review of Systems    General:  No chills, fever, night sweats or weight changes.  Cardiovascular:  No chest pain, palpitations, paroxysmal nocturnal dyspnea. Positive for orthopnea, edema and dyspnea on exertion.  Dermatological: No rash, lesions/masses Respiratory: No cough, dyspnea Urologic: No hematuria, dysuria Abdominal:   No nausea, vomiting, diarrhea, bright  red blood per rectum, melena, or hematemesis Neurologic:  No visual changes, wkns, changes in mental status. All other systems reviewed and are otherwise negative except as noted above.  Physical Exam/Data    Vitals:   04/24/19 0500 04/24/19 0532 04/24/19 0709 04/24/19 0736  BP:  114/64 109/81   Pulse:  (!) 49 70   Resp:  18    Temp:  97.9 F (36.6 C)    TempSrc:  Oral    SpO2:  98% 99% 96%  Weight: 101.2 kg     Height:        Intake/Output Summary (Last 24 hours) at 04/24/2019 1010 Last data filed at 04/24/2019 0912 Gross per 24 hour  Intake 483 ml  Output 3650 ml  Net -3167 ml   Filed Weights   04/23/19 1012 04/23/19 1509 04/24/19 0500  Weight: 103.4 kg 102.3 kg 101.2 kg   Body mass index is 30.26 kg/m.   General: Pleasant male appearing in NAD Psych: Normal affect. Neuro: Alert and oriented X 3. Moves all extremities spontaneously. HEENT: Normal  Neck: Supple without bruits. JVD at 8cm. Lungs:  Resp regular and unlabored, CTA. Heart: Irregularly irregular. no s3, s4, or murmurs. Abdomen: Soft, non-tender, BS + x 4. Appears distended.  Extremities: No clubbing or cyanosis. 1+ pitting edema bilaterally. DP/PT/Radials 2+ and equal bilaterally.   EKG:  The EKG was personally reviewed and demonstrates: Rate-controlled atrial fibrillation, HR 93 with RBBB.   Telemetry:  Telemetry was personally reviewed and demonstrates: Rate-controlled atrial fibrillation, HR in 70's to 90's. Episodes of NSVT up to 8 beats.    Labs/Studies     Relevant CV Studies:  Cardiac Catheterization: 06/2011  Procedural Findings: Hemodynamics RA 6/4with a mean of 2 mmHg RV 20/1 mmHg PA 31/13 with a mean of 21 mmHg PCWP 11/9 with a mean of 6 mm mercury LV 104/4 mmHg AO 98/72 with a mean of 84 mmHg  Oxygen saturations: PA 62% AO 93%  Cardiac Output (Fick) 4.42 L per minute  Cardiac Index (Fick) 2.1 L per minute  per meter square         Coronary angiography: Coronary  dominance: right  Left mainstem: Normal  Left anterior descending (LAD): Normal  Left circumflex (LCx): Normal  Right coronary artery (RCA): Normal  Left ventriculography: Limited injection demonstrates left ventricular enlargement with severe global hypokinesis. Ejection fraction is estimated at 15-20%.  Final Conclusions:   1. Normal coronary anatomy. 2. Severe left ventricular dysfunction. 3. Normal right heart pressures.  Recommendations: Recommend optimizing medications for congestive heart failure. Patient needs to abstain from tobacco and alcohol. May resume metformin 48 hours post cath.  Echocardiogram: 11/2013 Study Conclusions   - Left ventricle: The cavity size was normal. Wall thickness was  increased in a pattern of mild LVH. Systolic function was  moderately to severely reduced. The estimated ejection fraction  was in the range of 30% to 35%. Diffuse hypokinesis. Doppler  parameters are consistent with abnormal left ventricular  relaxation (grade 1 diastolic dysfunction).  - Aortic valve: Mildly calcified annulus. Trileaflet; mildly  thickened leaflets.  - Left atrium: The atrium was moderately dilated.  - Atrial septum: No defect or patent foramen ovale was identified.  - Technically adequate study.    Laboratory Data:  Chemistry Recent Labs  Lab 04/23/19 1020 04/24/19 0452  NA 135 139  K 4.1 3.7  CL 103 104  CO2 21* 25  GLUCOSE 224* 117*  BUN 16 17  CREATININE 1.45* 1.40*  CALCIUM 8.6* 8.4*  GFRNONAA 51* 53*  GFRAA 59* >60  ANIONGAP 11 10    Recent Labs  Lab 04/23/19 1020  PROT 6.5  ALBUMIN 3.5  AST 44*  ALT 66*  ALKPHOS 100  BILITOT 0.8   Hematology Recent Labs  Lab 04/23/19 1020  WBC 7.2  RBC 3.57*  HGB 12.8*  HCT 39.3  MCV 110.1*  MCH 35.9*  MCHC 32.6  RDW 12.8  PLT 191   Cardiac EnzymesNo results for input(s): TROPONINI in the last 168 hours. No results for input(s): TROPIPOC in the last 168 hours.   BNP Recent Labs  Lab 04/23/19 1020  BNP 1,155.0*    DDimer No results for input(s): DDIMER in the last 168 hours.  Radiology/Studies:  DG Chest Port 1 View  Result Date: 04/23/2019 CLINICAL DATA:  Shortness of breath EXAM: PORTABLE CHEST 1 VIEW COMPARISON:  September 08, 2012 FINDINGS: There is a small pleural effusion on the right with equivocal pleural effusion on the left. No edema or airspace opacity evident. There is cardiomegaly with pulmonary venous hypertension. No adenopathy. No bone lesions. IMPRESSION: Cardiomegaly with pulmonary vascular congestion. Small right pleural effusion with equivocal left pleural effusion. Lungs otherwise clear. No adenopathy. Electronically Signed   By: Lowella Grip III M.D.   On: 04/23/2019 11:13     Assessment & Plan    1. Acute on Chronic Combined Systolic and Diastolic CHF/History of NICM - EF previously 15% in 2013 with cath showing normal cors, EF 30-35% by repeat echo in 2015. No routine outpatient Cardiology follow-up since 2016 but had declined ICD placement at that time. PCP stopped his diuretic approximately 1 year ago.  - presented with worsening dyspnea on exertion, orthopnea, abdominal distension and lower extremity edema for the past 3 weeks and found to have an acute CHF exacerbation with BNP elevated to 1155 and CXR consistent with CHF.  - started on IV Lasix 58m BID at the time of admission (also received 441mwhile in the ED) with a recorded output of -3.4L thus far. Weight  down 5 lbs (228 --> 223 lbs). Given continued volume overload by examination, will titrate Lasix to 62m BID. Continue to follow I&O's along with daily weights.  - repeat echocardiogram currently being performed with results pending. Continue Coreg 3.1270mBID. If EF remains significantly reduced, would anticipate transitioning Losartan to Entresto if BP allows.  2. Newly Documented Atrial Fibrillation - newly documented this admission. TSH and K+ WNL. Mg was  low at 1.7 and replacement has been ordered by the admitting team. Rates have been well-controlled since admission. Continue Coreg 3.12566mID and titrate as BP allows.  - This patients CHA2DS2-VASc Score and unadjusted Ischemic Stroke Rate (% per year) is equal to at least 3.2 % stroke rate/year from a score of 3 (CHF, HTN, DM). Would plan to start anticoagulation prior to discharge. Will await echo results to ensure no invasive procedures such as repeat R/LHC are indicated this admission.   3. HTN - BP has been soft with IV diuresis, at 109/81 on most recent check. Coreg has been restarted at a lower dose of 3.125m73mD. PTA Losartan and Spironolactone currently held.   4. HLD - followed by PCP as an outpatient. He has been continued on PTA Simvastatin 40mg37mly.   5. Stage 3 CKD - unknown baseline given no recent results for comparison. Variable from 0.89 to 1.92 in 2016. Creatinine was elevated to 1.45 on admission, improved to 1.40 today. Follow with diuresis.    For questions or updates, please contact [facility] consult [email or web address] for contact info under Cardiology/STEMI.  Signed, BrittErma HeritageC 04/24/2019, 10:10 AM Pager: [phone] note Patient seen and discussed with PA StradAhmed Primagree with her documentation. History of chronic systolic HF LVEF 30-3537-16%015 echo, has not followed up since 2016 in cardiology clinic. Cath in 2013 without significant disease, consistent with a NICM. Presents with LE edema, orthopnea, SOB.    WBC 7.2 Hgb 12.8 Plt 191 K 4.1 Cr 1.45 AST 44 ALT 66 BNP 1155 TSH 4.3 Mg 1.7 hstrop 42-->38 COVID neg CXR pulm congestion  11/2013 echo: LVEF 30-35%, grade I DDX  Long history of NICM, has not followed with cardiology in several years. Presents volume overloaded. Negative 2.4 L thus far early on in admission, he received IV lasix 40mg 44m20mg y64mrday, cunrrently scheduled for 40mg bi37mday. Mild downtrend in Cr  with diuresis, continue IV diuretics. Continuing coreg, his losartan and aldactone held on admit due to elevated Cr and some soft bp's. Follow bp's and renal function, likely restart these meds this admissoin. Avoid entresto until we know he can tolerate an ARB. New diagnosis of afib this admission, he is rate controlled. Prelim on echo shows apical thrombus, thus coumadin would be the favor anticoagulation and would need to avoid any consideration of DCCV until resolved. Does not need bridging, after 3 months would repeat echo, if LV thrombus resolved could change to DOAC at that time. Short run of NSVT, monitor at this time, titrate beta blocker when more euvolemic, suspect related to his chronic systoilc dysfunction, prior cath without significant disease. Keep K at 4 Mg at 2.     JonathanCarlyle [name]

## 2019-04-24 NOTE — Progress Notes (Addendum)
ANTICOAGULATION CONSULT NOTE - Initial Up Consult   Pharmacy Consult for warfarin dosing  Indication: atrial fibrillation and LV thrombus    No Known Allergies    Patient Measurements: Last Weight  Most recent update: 04/24/2019  5:34 AM   Weight  101.2 kg (223 lb 1.7 oz)           Body mass index is 30.26 kg/m. [name] [name]               Temp: 97.9 F (36.6 C) (04/06 0532) Temp Source: Oral (04/06 0532) BP: 109/81 (04/06 0709) Pulse Rate: 70 (04/06 0709)  Labs: Recent Labs    04/23/19 1020 04/24/19 0452 04/24/19 1052  HGB 12.8*  --   --   HCT 39.3  --   --   PLT 191  --   --   LABPROT  --   --  13.2  INR  --   --  1.0  CREATININE 1.45* 1.40*  --     Estimated Creatinine Clearance: 67.3 mL/min (A) (by C-G formula based on SCr of 1.4 mg/dL (H)).     Medications:  Medications Prior to Admission  Medication Sig Dispense Refill Last Dose  . carvedilol (COREG) 6.25 MG tablet Take 6.25 mg by mouth at bedtime.   04/22/2019 at Unknown time  . gabapentin (NEURONTIN) 300 MG capsule Take 300 mg by mouth 3 (three) times daily.   04/22/2019 at Unknown time  . metFORMIN (GLUCOPHAGE) 500 MG tablet Take 500 mg by mouth 2 (two) times daily.   04/22/2019 at Unknown time  . naproxen (NAPROSYN) 500 MG tablet Take 500 mg by mouth 2 (two) times daily.   04/22/2019 at Unknown time  . Oxycodone HCl 10 MG TABS Take 10 mg by mouth 4 (four) times daily as needed.   04/22/2019 at Unknown time  . simvastatin (ZOCOR) 40 MG tablet Take 1 tablet (40 mg total) by mouth at bedtime. 90 tablet 1 04/22/2019 at Unknown time  . spironolactone (ALDACTONE) 25 MG tablet TAKE 1/2 TABLET BY MOUTH DAILY. 15 tablet 3 04/22/2019 at Unknown time  . ALPRAZolam (XANAX) 1 MG tablet Take 1 tablet (1 mg total) by mouth 2 (two) times daily as needed for anxiety. (Patient not taking: Reported on 04/23/2019) 60 tablet 0 Not Taking at Unknown time  . busPIRone (BUSPAR) 10 MG tablet Take 1 tablet (10 mg total) by mouth 2 (two) times  daily. (Patient not taking: Reported on 04/23/2019) 60 tablet 1 Not Taking at Unknown time  . ketoconazole (NIZORAL) 2 % shampoo Apply 1 application topically 2 (two) times a week. (Patient not taking: Reported on 04/23/2019) 120 mL 1 Not Taking at Unknown time  . losartan (COZAAR) 25 MG tablet Take 0.5 tablets (12.5 mg total) by mouth daily. (Patient not taking: Reported on 04/23/2019) 45 tablet 0 Not Taking at Unknown time  . torsemide (DEMADEX) 20 MG tablet May take 20 mg extra daily for shortness of breath or leg swelling (Patient not taking: Reported on 04/23/2019) 45 tablet 3 Not Taking at Unknown time   Scheduled:  . carvedilol  3.125 mg Oral QHS  . furosemide  40 mg Intravenous Q12H  . gabapentin  200 mg Oral TID  . heparin  5,000 Units Subcutaneous Q8H  . insulin aspart  0-5 Units Subcutaneous QHS  . insulin aspart  0-6 Units Subcutaneous TID WC  . simvastatin  40 mg Oral QHS  . sodium chloride flush  3 mL Intravenous Q12H  .  warfarin  10 mg Oral ONCE-1600  . Warfarin - Pharmacist Dosing Inpatient   Does not apply q1600   Infusions:  . sodium chloride     PRN: sodium chloride, acetaminophen, guaiFENesin-dextromethorphan, ondansetron (ZOFRAN) IV, oxyCODONE, sodium chloride flush Anti-infectives (From admission, onward)   None      Goal of Therapy:  INR 2-3 Monitor platelets by anticoagulation protocol: Yes   Patient was not on warfarin prior to admission    Admit INR was 1.0 Lab Results  Component Value Date   INR 1.0 04/24/2019   INR 0.97 07/09/2011   INR 1.0 06/28/2006    Assessment: [name] [name] a 64 y.o. male requires anticoagulation with warfarin for the indication of  atrial fibrillation and LV thrombus . Patient is also on heparin 5000units subq q8h.  Warfarin will be initiated inpatient following pharmacy protocol per pharmacy consult. Patient most recent blood work is as follows:  CBC Latest Ref Rng & Units 04/23/2019 07/09/2012 05/04/2012  WBC 4.0 - 10.5 K/uL  7.2 7.6 6.2  Hemoglobin 13.0 - 17.0 g/dL 12.8(L) 13.6 14.9  Hematocrit 39.0 - 52.0 % 39.3 40.1 42.5  Platelets 150 - 400 K/uL 191 185 244     Plan: Warfarin 10mg  po x 1  Monitor CBC MWF with am labs   Monitor INR daily Monitor for signs and symptoms of bleeding   Donna Christen Tremain Rucinski, PharmD, MBA, BCGP Clinical Pharmacist

## 2019-04-24 NOTE — Plan of Care (Signed)
  Problem: Education: Goal: Knowledge of General Education information will improve Description: Including pain rating scale, medication(s)/side effects and non-pharmacologic comfort measures Outcome: Progressing   Problem: Clinical Measurements: Goal: Ability to maintain clinical measurements within normal limits will improve Outcome: Progressing Goal: Will remain free from infection Outcome: Progressing Goal: Diagnostic test results will improve Outcome: Progressing Goal: Respiratory complications will improve Outcome: Progressing Goal: Cardiovascular complication will be avoided Outcome: Progressing   Problem: Activity: Goal: Risk for activity intolerance will decrease Outcome: Progressing   Problem: Safety: Goal: Ability to remain free from injury will improve Outcome: Progressing   Problem: Skin Integrity: Goal: Risk for impaired skin integrity will decrease Outcome: Progressing

## 2019-04-24 NOTE — Progress Notes (Signed)
Notified by telemetry that patient had 8 beat run of Vtach. MD notified.

## 2019-04-24 NOTE — Progress Notes (Signed)
PROGRESS NOTE    [name] [name]  [id number] DOB: 1955/09/23 DOA: 04/23/2019 PCP: Lucia Gaskins, MD    Brief Narrative:  64 y.o. male with a past medical history significant for chronic systolic heart failure, hypertension, hyperlipidemia, type 2 diabetes with nephropathy, chronic kidney disease a stage IIIa, anxiety, chronic pain and diabetic neuropathy; who presented to the hospital secondary to approximately 3 weeks of worsening swelling and associated shortness of breath.  Patient reports symptoms worsening in this timeframe with association of dyspnea on exertion, orthopnea and increased lower extremity swelling.  He reports to be compliant with medications; but expressed recently being told not to take some of his diuretics medications due to renal dysfunction.  He has no follow-up with cardiology in over 4 years or so. Patient denies chest pain, fever, productive cough, dysuria, hematuria, melena, hematochezia, focal weakness, nausea, vomiting headaches, blurred vision or any other complaints.  In the ED chest x-ray demonstrated interstitial edema with vascular congestion, physical exam demonstrating lower extremity swelling (2+ edema), elevated BNP and a creatinine level of 1.45.  Patient received a dose of Lasix which was minimal improvement in his symptoms and is still being very tachypneic with minimal exertion.  TRH was consulted to admit patient for further evaluation and management of acute on chronic CHF.   Assessment & Plan: 1-Acute on chronic systolic and diastolic (congestive) heart failure (HCC) -combined CHF base on echo results -EF 30-35% -concerns for semi thrombus appreciated as well; per cardiology service will start patient on coumadin. -still SOB on exertion and complaining of orthopnea, despite good response to diuresis. -continue IV diuretics -continue daily weight, strict I's and O's and low sodium diet  -will continue b-blocker and follow further  recommendations by cardiology service. -wean O2 supplementation as tolerated.  2-cardiac thrombus -as mentioned above and seen on echo -started on coumadin -no bridging needed -repeat echo in 3 months (cardiology will arrange follow up)  3-Type 2 diabetes with nephropathy (HCC) -A1C 7.3 -continue SSI -continue holding oral hypoglycemic regimen   4-Hypertension -soft, but stable -continue to follow VS and continue current antihypertensive regimen   5-HLD -continue statins  6-Chronic pain -continue home PRN analgesic regimen   7-Chronic renal failure, stage 3a -avoid hypotension and minimize nephrotoxic agents usage acutely -follow renal function trend with acute diuresis   8-non-sustained Vtach -asymptomatic -will maintain Mg above 2 and K above 4 as much as possible -continue monitoring telemetry    DVT prophylaxis: started on coumadin (no bridging needed). Code Status: Full code. Family Communication: wife updated over the phone.  Disposition Plan: discharge home once volume status and breathing stabilized. Still SOB on exertion, complaining of orthopnea and with swelling appreciated on his legs. 2L [place] in place. Cardiology is on board and will follow any further recommendations.   Consultants:   Cardiology service.   Procedures:   2-D echo  1. The is a 1.0 x 0.92 cm apical thrombus that is semi mobile. . Left  ventricular ejection fraction, by estimation, is 25 to 30%. The left  ventricle has severely decreased function. The left ventricle demonstrates  global hypokinesis. There is mild left  ventricular hypertrophy. Left ventricular diastolic parameters are  indeterminate.  2. Right ventricular systolic function is moderately reduced. The right  ventricular size is mildly enlarged. There is moderately elevated  pulmonary artery systolic pressure.  3. Left atrial size was severely dilated.  4. The mitral valve is normal in structure. No evidence of mitral  valve  regurgitation.  No evidence of mitral stenosis.  5. The aortic valve is tricuspid. Aortic valve regurgitation is not  visualized. No aortic stenosis is present.  6. Indeterminant PASP, inadequate TR jet.  7. The inferior vena cava is normal in size with greater than 50%  respiratory variability, suggesting right atrial pressure of 3 mmHg.   Antimicrobials:  Anti-infectives (From admission, onward)   None      Subjective: Afebrile, no CP. Reports SOB sensation with exertion, still with LE swelling and also complaining of orthopnea.   Objective: Vitals:   04/24/19 0500 04/24/19 0532 04/24/19 0709 04/24/19 0736  BP:  114/64 109/81   Pulse:  (!) 49 70   Resp:  18    Temp:  97.9 F (36.6 C)    TempSrc:  Oral    SpO2:  98% 99% 96%  Weight: 101.2 kg     Height:        Intake/Output Summary (Last 24 hours) at 04/24/2019 1152 Last data filed at 04/24/2019 0912 Gross per 24 hour  Intake 483 ml  Output 3650 ml  Net -3167 ml   Filed Weights   04/23/19 1012 04/23/19 1509 04/24/19 0500  Weight: 103.4 kg 102.3 kg 101.2 kg    Examination: General exam: Alert, awake, oriented x 3; still SOB on exertion, complaning of orthopnea and with LE extremities swelling (even better). 2L [place] O2 supplementation in place.  Respiratory system: decrease BS at the bases, no using accessory muscles, unable to laying flat due to SOB and with tachypnea appreciated on exertion.  Cardiovascular system:Rate controlled, no rubs, no gallops, mild JVD on exam.  Gastrointestinal system: Abdomen is nondistended, soft and nontender. No organomegaly or masses felt. Normal bowel sounds heard. Central nervous system: Alert and oriented. No focal neurological deficits. Extremities: No cyanosis, no clubbing, positive 1+ edema bilaterally.  Skin: No rashes, no open wounds.  Psychiatry: Judgement and insight appear normal. Mood & affect appropriate.     Data Reviewed: I have personally reviewed following labs  and imaging studies  CBC: Recent Labs  Lab 04/23/19 1020  WBC 7.2  HGB 12.8*  HCT 39.3  MCV 110.1*  PLT 99991111   Basic Metabolic Panel: Recent Labs  Lab 04/23/19 1020 04/23/19 1334 04/24/19 0452  NA 135  --  139  K 4.1  --  3.7  CL 103  --  104  CO2 21*  --  25  GLUCOSE 224*  --  117*  BUN 16  --  17  CREATININE 1.45*  --  1.40*  CALCIUM 8.6*  --  8.4*  MG  --  1.7  --    GFR: Estimated Creatinine Clearance: 67.3 mL/min (A) (by C-G formula based on SCr of 1.4 mg/dL (H)).   Liver Function Tests: Recent Labs  Lab 04/23/19 1020  AST 44*  ALT 66*  ALKPHOS 100  BILITOT 0.8  PROT 6.5  ALBUMIN 3.5   Coagulation Profile: Recent Labs  Lab 04/24/19 1052  INR 1.0   HbA1C: Recent Labs    04/23/19 1020  HGBA1C 7.3*   CBG: Recent Labs  Lab 04/23/19 2120 04/24/19 0715 04/24/19 1111  GLUCAP 160* 134* 237*   Thyroid Function Tests: Recent Labs    04/23/19 1020  TSH 4.363   Urine analysis:    Component Value Date/Time   COLORURINE YELLOW 06/28/2006 1102   APPEARANCEUR CLEAR 06/28/2006 1102   LABSPEC 1.020 06/28/2006 1102   PHURINE 6.0 06/28/2006 1102   GLUCOSEU NEGATIVE 06/28/2006 1102  HGBUR NEGATIVE 06/28/2006 1102   BILIRUBINUR NEGATIVE 06/28/2006 1102   KETONESUR NEGATIVE 06/28/2006 1102   PROTEINUR NEGATIVE 06/28/2006 1102   UROBILINOGEN 1.0 06/28/2006 1102   NITRITE NEGATIVE 06/28/2006 1102   LEUKOCYTESUR  06/28/2006 1102    NEGATIVE MICROSCOPIC NOT DONE ON URINES WITH NEGATIVE PROTEIN, BLOOD, LEUKOCYTES, NITRITE, OR GLUCOSE <1000 mg/dL.    Recent Results (from the past 240 hour(s))  SARS CORONAVIRUS 2 (TAT 6-24 HRS) Nasopharyngeal Nasopharyngeal Swab     Status: None   Collection Time: 04/23/19  1:23 PM   Specimen: Nasopharyngeal Swab  Result Value Ref Range Status   SARS Coronavirus 2 NEGATIVE NEGATIVE Final    Comment: (NOTE) SARS-CoV-2 target nucleic acids are NOT DETECTED. The SARS-CoV-2 RNA is generally detectable in upper and  lower respiratory specimens during the acute phase of infection. Negative results do not preclude SARS-CoV-2 infection, do not rule out co-infections with other pathogens, and should not be used as the sole basis for treatment or other patient management decisions. Negative results must be combined with clinical observations, patient history, and epidemiological information. The expected result is Negative. Fact Sheet for Patients: SugarRoll.be Fact Sheet for Healthcare Providers: https://www.woods-mathews.com/ This test is not yet approved or cleared by the [place] FDA and  has been authorized for detection and/or diagnosis of SARS-CoV-2 by FDA under an Emergency Use Authorization (EUA). This EUA will remain  in effect (meaning this test can be used) for the duration of the COVID-19 declaration under Section 56 4(b)(1) of the Act, 21 U.S.C. section 360bbb-3(b)(1), unless the authorization is terminated or revoked sooner. Performed at [facility], [place] [address] [place], [place] [address]      Radiology Studies: DG Chest Port 1 View  Result Date: 04/23/2019 CLINICAL DATA:  Shortness of breath EXAM: PORTABLE CHEST 1 VIEW COMPARISON:  September 08, 2012 FINDINGS: There is a small pleural effusion on the right with equivocal pleural effusion on the left. No edema or airspace opacity evident. There is cardiomegaly with pulmonary venous hypertension. No adenopathy. No bone lesions. IMPRESSION: Cardiomegaly with pulmonary vascular congestion. Small right pleural effusion with equivocal left pleural effusion. Lungs otherwise clear. No adenopathy. Electronically Signed   By: Lowella Grip III M.D.   On: 04/23/2019 11:13   ECHOCARDIOGRAM COMPLETE  Result Date: 04/24/2019    ECHOCARDIOGRAM REPORT   Patient Name:   [name] [name] [place] Date of Exam: 04/24/2019 Medical Rec #:  [id number]        Height:       72.0 in Accession #:    [id number]        Weight:       223.1 lb Date of Birth:  [birth year]       BSA:          2.232 m Patient Age:    30 years         BP:           109/81 mmHg Patient Gender: M                HR:           70 bpm. Exam Location:  [facility] Procedure: 2D Echo Indications:    CHF-Acute Systolic [id number] / [id number]  History:        Patient has prior history of Echocardiogram examinations, most                 recent 12/12/2013. CHF; Risk Factors:Hypertension, Dyslipidemia,  Diabetes and Former Smoker. Chronic renal failure, stage 3a.  Sonographer:    Leavy Cella RDCS (AE) Referring Phys: [place]  1. The is a 1.0 x 0.92 cm apical thrombus that is semi mobile. . Left ventricular ejection fraction, by estimation, is 25 to 30%. The left ventricle has severely decreased function. The left ventricle demonstrates global hypokinesis. There is mild left ventricular hypertrophy. Left ventricular diastolic parameters are indeterminate.  2. Right ventricular systolic function is moderately reduced. The right ventricular size is mildly enlarged. There is moderately elevated pulmonary artery systolic pressure.  3. Left atrial size was severely dilated.  4. The mitral valve is normal in structure. No evidence of mitral valve regurgitation. No evidence of mitral stenosis.  5. The aortic valve is tricuspid. Aortic valve regurgitation is not visualized. No aortic stenosis is present.  6. Indeterminant PASP, inadequate TR jet.  7. The inferior vena cava is normal in size with greater than 50% respiratory variability, suggesting right atrial pressure of 3 mmHg. FINDINGS  Left Ventricle: The is a 1.0 x 0.92 cm apical thrombus that is semi mobile. Left ventricular ejection fraction, by estimation, is 25 to 30%. The left ventricle has severely decreased function. The left ventricle demonstrates global hypokinesis. The left  ventricular internal cavity size was normal in size. There is mild left ventricular hypertrophy. Left  ventricular diastolic parameters are indeterminate. Right Ventricle: The right ventricular size is mildly enlarged. No increase in right ventricular wall thickness. Right ventricular systolic function is moderately reduced. There is moderately elevated pulmonary artery systolic pressure. The tricuspid regurgitant velocity is 3.09 m/s, and with an assumed right atrial pressure of 10 mmHg, the estimated right ventricular systolic pressure is 99991111 mmHg. Left Atrium: Left atrial size was severely dilated. Right Atrium: Right atrial size was normal in size. Pericardium: There is no evidence of pericardial effusion. Mitral Valve: The mitral valve is normal in structure. No evidence of mitral valve regurgitation. No evidence of mitral valve stenosis. Tricuspid Valve: The tricuspid valve is normal in structure. Tricuspid valve regurgitation is mild . No evidence of tricuspid stenosis. Aortic Valve: The aortic valve is tricuspid. . There is mild thickening and mild calcification of the aortic valve. Aortic valve regurgitation is not visualized. No aortic stenosis is present. Mild aortic valve annular calcification. There is mild thickening of the aortic valve. There is mild calcification of the aortic valve. Aortic valve mean gradient measures 4.1 mmHg. Aortic valve peak gradient measures 8.0 mmHg. Aortic valve area, by VTI measures 1.84 cm. Pulmonic Valve: The pulmonic valve was not well visualized. Pulmonic valve regurgitation is not visualized. No evidence of pulmonic stenosis. Aorta: The aortic root is normal in size and structure. Pulmonary Artery: Indeterminant PASP, inadequate TR jet. Venous: The inferior vena cava is normal in size with greater than 50% respiratory variability, suggesting right atrial pressure of 3 mmHg. IAS/Shunts: No atrial level shunt detected by color flow Doppler.  LEFT VENTRICLE PLAX 2D LVIDd:         5.92 cm  Diastology LVIDs:         4.96 cm  LV e' lateral:   7.37 cm/s LV PW:         1.20 cm   LV E/e' lateral: 16.2 LV IVS:        1.27 cm  LV e' medial:    3.49 cm/s LVOT diam:     2.00 cm  LV E/e' medial:  34.2 LV SV:  41 LV SV Index:   18 LVOT Area:     3.14 cm  RIGHT VENTRICLE RV S prime:     8.06 cm/s TAPSE (M-mode): 1.1 cm LEFT ATRIUM           Index LA diam:      4.50 cm 2.02 cm/m LA Vol (A2C): 90.4 ml 40.50 ml/m LA Vol (A4C): 76.7 ml 34.37 ml/m  AORTIC VALVE AV Area (Vmax):    1.76 cm AV Area (Vmean):   1.77 cm AV Area (VTI):     1.84 cm AV Vmax:           141.17 cm/s AV Vmean:          94.367 cm/s AV VTI:            0.224 m AV Peak Grad:      8.0 mmHg AV Mean Grad:      4.1 mmHg LVOT Vmax:         79.10 cm/s LVOT Vmean:        53.300 cm/s LVOT VTI:          0.131 m LVOT/AV VTI ratio: 0.58  AORTA Ao Root diam: 3.00 cm MITRAL VALVE                TRICUSPID VALVE MV Area (PHT): 4.82 cm     TR Peak grad:   38.2 mmHg MV Decel Time: 157 msec     TR Vmax:        309.00 cm/s MR Peak grad: 61.5 mmHg MR Mean grad: 42.0 mmHg     SHUNTS MR Vmax:      392.00 cm/s   Systemic VTI:  0.13 m MR Vmean:     309.0 cm/s    Systemic Diam: 2.00 cm MV E velocity: 119.33 cm/s Carlyle Dolly MD Electronically signed by Carlyle Dolly MD Signature Date/Time: 04/24/2019/11:07:08 AM    Final     Scheduled Meds: . carvedilol  3.125 mg Oral QHS  . furosemide  40 mg Intravenous Q12H  . gabapentin  200 mg Oral TID  . heparin  5,000 Units Subcutaneous Q8H  . insulin aspart  0-5 Units Subcutaneous QHS  . insulin aspart  0-6 Units Subcutaneous TID WC  . simvastatin  40 mg Oral QHS  . sodium chloride flush  3 mL Intravenous Q12H   Continuous Infusions: . sodium chloride       LOS: 1 day    Time spent: 30 minutes.   Barton Dubois, MD [facility] Pager [phone]   04/24/2019, 11:52 AM

## 2019-04-25 DIAGNOSIS — G894 Chronic pain syndrome: Secondary | ICD-10-CM | POA: Diagnosis not present

## 2019-04-25 DIAGNOSIS — E782 Mixed hyperlipidemia: Secondary | ICD-10-CM

## 2019-04-25 DIAGNOSIS — N1831 Chronic kidney disease, stage 3a: Secondary | ICD-10-CM

## 2019-04-25 DIAGNOSIS — J9601 Acute respiratory failure with hypoxia: Secondary | ICD-10-CM

## 2019-04-25 DIAGNOSIS — E1121 Type 2 diabetes mellitus with diabetic nephropathy: Secondary | ICD-10-CM

## 2019-04-25 LAB — MAGNESIUM: Magnesium: 1.7 mg/dL (ref 1.7–2.4)

## 2019-04-25 LAB — BASIC METABOLIC PANEL
Anion gap: 10 (ref 5–15)
BUN: 21 mg/dL (ref 8–23)
CO2: 25 mmol/L (ref 22–32)
Calcium: 8.4 mg/dL — ABNORMAL LOW (ref 8.9–10.3)
Chloride: 103 mmol/L (ref 98–111)
Creatinine, Ser: 1.45 mg/dL — ABNORMAL HIGH (ref 0.61–1.24)
GFR calc Af Amer: 59 mL/min — ABNORMAL LOW (ref >60)
GFR calc non Af Amer: 51 mL/min — ABNORMAL LOW (ref >60)
Glucose, Bld: 127 mg/dL — ABNORMAL HIGH (ref 70–99)
Potassium: 3.6 mmol/L (ref 3.5–5.1)
Sodium: 138 mmol/L (ref 135–145)

## 2019-04-25 LAB — CBC
HCT: 37.4 % — ABNORMAL LOW (ref 39.0–52.0)
Hemoglobin: 12.3 g/dL — ABNORMAL LOW (ref 13.0–17.0)
MCH: 35.5 pg — ABNORMAL HIGH (ref 26.0–34.0)
MCHC: 32.9 g/dL (ref 30.0–36.0)
MCV: 108.1 fL — ABNORMAL HIGH (ref 80.0–100.0)
Platelets: 193 10*3/uL (ref 150–400)
RBC: 3.46 MIL/uL — ABNORMAL LOW (ref 4.22–5.81)
RDW: 12.8 % (ref 11.5–15.5)
WBC: 5.5 10*3/uL (ref 4.0–10.5)
nRBC: 0 % (ref 0.0–0.2)

## 2019-04-25 LAB — GLUCOSE, CAPILLARY
Glucose-Capillary: 124 mg/dL — ABNORMAL HIGH (ref 70–99)
Glucose-Capillary: 202 mg/dL — ABNORMAL HIGH (ref 70–99)
Glucose-Capillary: 169 mg/dL — ABNORMAL HIGH (ref 70–99)
Glucose-Capillary: 139 mg/dL — ABNORMAL HIGH (ref 70–99)

## 2019-04-25 LAB — HEPARIN LEVEL (UNFRACTIONATED): Heparin Unfractionated: 0.53 IU/mL (ref 0.30–0.70)

## 2019-04-25 LAB — PROTIME-INR
INR: 1.1 (ref 0.8–1.2)
Prothrombin Time: 14 seconds (ref 11.4–15.2)

## 2019-04-25 MED ORDER — HEPARIN (PORCINE) 25000 UT/250ML-% IV SOLN
1500.0000 [IU]/h | INTRAVENOUS | Status: DC
  Administered 2019-04-25: 09:00:00 1600 [IU]/h via INTRAVENOUS
  Administered 2019-04-26: 18:00:00 1500 [IU]/h via INTRAVENOUS
  Administered 2019-04-26: 1600 [IU]/h via INTRAVENOUS
  Administered 2019-04-27: 13:00:00 1500 [IU]/h via INTRAVENOUS
  Filled 2019-04-25 (×4): qty 250

## 2019-04-25 MED ORDER — MAGNESIUM SULFATE 2 GM/50ML IV SOLN
2.0000 g | Freq: Once | INTRAVENOUS | Status: AC
  Administered 2019-04-25: 2 g via INTRAVENOUS
  Filled 2019-04-25: qty 50

## 2019-04-25 MED ORDER — LOSARTAN POTASSIUM 25 MG PO TABS
12.5000 mg | ORAL_TABLET | Freq: Every day | ORAL | Status: DC
  Administered 2019-04-26 – 2019-04-27 (×3): 12.5 mg via ORAL
  Filled 2019-04-25 (×5): qty 1

## 2019-04-25 MED ORDER — WARFARIN SODIUM 5 MG PO TABS
10.0000 mg | ORAL_TABLET | Freq: Once | ORAL | Status: AC
  Administered 2019-04-25: 10 mg via ORAL
  Filled 2019-04-25: qty 2

## 2019-04-25 MED ORDER — DIPHENHYDRAMINE HCL 25 MG PO CAPS
25.0000 mg | ORAL_CAPSULE | Freq: Every evening | ORAL | Status: DC | PRN
  Administered 2019-04-25: 22:00:00 25 mg via ORAL
  Filled 2019-04-25: qty 1

## 2019-04-25 NOTE — Progress Notes (Signed)
Progress Note  Patient Name: [name] [name] Date of Encounter: 04/25/2019  Primary Cardiologist: Kate Sable, MD   Subjective   Improving SOB  Inpatient Medications    Scheduled Meds: . carvedilol  3.125 mg Oral QHS  . furosemide  40 mg Intravenous Q12H  . gabapentin  200 mg Oral TID  . insulin aspart  0-5 Units Subcutaneous QHS  . insulin aspart  0-6 Units Subcutaneous TID WC  . simvastatin  40 mg Oral QHS  . sodium chloride flush  3 mL Intravenous Q12H  . warfarin  10 mg Oral ONCE-1600  . Warfarin - Pharmacist Dosing Inpatient   Does not apply q1600   Continuous Infusions: . sodium chloride    . heparin 1,600 Units/hr (04/25/19 0906)   PRN Meds: sodium chloride, acetaminophen, guaiFENesin-dextromethorphan, ondansetron (ZOFRAN) IV, oxyCODONE, sodium chloride flush, traZODone   Vital Signs    Vitals:   04/24/19 1316 04/24/19 1947 04/24/19 2126 04/25/19 0607  BP: 110/81  114/70 109/76  Pulse: 94  (!) 57 83  Resp: 20  20 20   Temp: 98 F (36.7 C)  98.9 F (37.2 C) 98.3 F (36.8 C)  TempSrc: Oral  Oral Oral  SpO2: 99% 98% 99% 100%  Weight:    99.8 kg  Height:        Intake/Output Summary (Last 24 hours) at 04/25/2019 0916 Last data filed at 04/25/2019 0908 Gross per 24 hour  Intake 483 ml  Output 2500 ml  Net -2017 ml   Last 3 Weights 04/25/2019 04/24/2019 04/23/2019  Weight (lbs) 220 lb 0.3 oz 223 lb 1.7 oz 225 lb 8.5 oz  Weight (kg) 99.8 kg 101.2 kg 102.3 kg      Telemetry    Rate controlled afib - Personally Reviewed  ECG    n/a - Personally Reviewed  Physical Exam   GEN: No acute distress.   Neck: No JVD Cardiac: irreg, no murmurs, rubs, or gallops.  Respiratory: Clear to auscultation bilaterally. GI: Soft, nontender, non-distended  MS: No edema; No deformity. Neuro:  Nonfocal  Psych: Normal affect   Labs    High Sensitivity Troponin:   Recent Labs  Lab 04/23/19 1020 04/23/19 1532  TROPONINIHS 42* 38*      Chemistry Recent Labs   Lab 04/23/19 1020 04/24/19 0452 04/25/19 0449  NA 135 139 138  K 4.1 3.7 3.6  CL 103 104 103  CO2 21* 25 25  GLUCOSE 224* 117* 127*  BUN 16 17 21   CREATININE 1.45* 1.40* 1.45*  CALCIUM 8.6* 8.4* 8.4*  PROT 6.5  --   --   ALBUMIN 3.5  --   --   AST 44*  --   --   ALT 66*  --   --   ALKPHOS 100  --   --   BILITOT 0.8  --   --   GFRNONAA 51* 53* 51*  GFRAA 59* >60 59*  ANIONGAP 11 10 10      Hematology Recent Labs  Lab 04/23/19 1020 04/25/19 0449  WBC 7.2 5.5  RBC 3.57* 3.46*  HGB 12.8* 12.3*  HCT 39.3 37.4*  MCV 110.1* 108.1*  MCH 35.9* 35.5*  MCHC 32.6 32.9  RDW 12.8 12.8  PLT 191 193    BNP Recent Labs  Lab 04/23/19 1020  BNP 1,155.0*     DDimer No results for input(s): DDIMER in the last 168 hours.   Radiology    DG Chest Port 1 View  Result Date: 04/23/2019 CLINICAL DATA:  Shortness of breath EXAM: PORTABLE CHEST 1 VIEW COMPARISON:  September 08, 2012 FINDINGS: There is a small pleural effusion on the right with equivocal pleural effusion on the left. No edema or airspace opacity evident. There is cardiomegaly with pulmonary venous hypertension. No adenopathy. No bone lesions. IMPRESSION: Cardiomegaly with pulmonary vascular congestion. Small right pleural effusion with equivocal left pleural effusion. Lungs otherwise clear. No adenopathy. Electronically Signed   By: Lowella Grip III M.D.   On: 04/23/2019 11:13   ECHOCARDIOGRAM COMPLETE  Result Date: 04/24/2019    ECHOCARDIOGRAM REPORT   Patient Name:   [name] [name] Date of Exam: 04/24/2019 Medical Rec #:  [id number]        Height:       72.0 in Accession #:    [id number]       Weight:       223.1 lb Date of Birth:  05-24-1955       BSA:          2.232 m Patient Age:    64 years         BP:           109/81 mmHg Patient Gender: M                HR:           70 bpm. Exam Location:  [facility] Procedure: 2D Echo Indications:    CHF-Acute Systolic 123456 / [id number]  History:        Patient has prior history of  Echocardiogram examinations, most                 recent 12/12/2013. CHF; Risk Factors:Hypertension, Dyslipidemia,                 Diabetes and Former Smoker. Chronic renal failure, stage 3a.  Sonographer:    Leavy Cella RDCS (AE) Referring Phys: [place]  1. The is a 1.0 x 0.92 cm apical thrombus that is semi mobile. . Left ventricular ejection fraction, by estimation, is 25 to 30%. The left ventricle has severely decreased function. The left ventricle demonstrates global hypokinesis. There is mild left ventricular hypertrophy. Left ventricular diastolic parameters are indeterminate.  2. Right ventricular systolic function is moderately reduced. The right ventricular size is mildly enlarged. There is moderately elevated pulmonary artery systolic pressure.  3. Left atrial size was severely dilated.  4. The mitral valve is normal in structure. No evidence of mitral valve regurgitation. No evidence of mitral stenosis.  5. The aortic valve is tricuspid. Aortic valve regurgitation is not visualized. No aortic stenosis is present.  6. Indeterminant PASP, inadequate TR jet.  7. The inferior vena cava is normal in size with greater than 50% respiratory variability, suggesting right atrial pressure of 3 mmHg. FINDINGS  Left Ventricle: The is a 1.0 x 0.92 cm apical thrombus that is semi mobile. Left ventricular ejection fraction, by estimation, is 25 to 30%. The left ventricle has severely decreased function. The left ventricle demonstrates global hypokinesis. The left  ventricular internal cavity size was normal in size. There is mild left ventricular hypertrophy. Left ventricular diastolic parameters are indeterminate. Right Ventricle: The right ventricular size is mildly enlarged. No increase in right ventricular wall thickness. Right ventricular systolic function is moderately reduced. There is moderately elevated pulmonary artery systolic pressure. The tricuspid regurgitant velocity is 3.09  m/s, and with an assumed right atrial pressure of 10 mmHg, the estimated right ventricular systolic pressure is  48.2 mmHg. Left Atrium: Left atrial size was severely dilated. Right Atrium: Right atrial size was normal in size. Pericardium: There is no evidence of pericardial effusion. Mitral Valve: The mitral valve is normal in structure. No evidence of mitral valve regurgitation. No evidence of mitral valve stenosis. Tricuspid Valve: The tricuspid valve is normal in structure. Tricuspid valve regurgitation is mild . No evidence of tricuspid stenosis. Aortic Valve: The aortic valve is tricuspid. . There is mild thickening and mild calcification of the aortic valve. Aortic valve regurgitation is not visualized. No aortic stenosis is present. Mild aortic valve annular calcification. There is mild thickening of the aortic valve. There is mild calcification of the aortic valve. Aortic valve mean gradient measures 4.1 mmHg. Aortic valve peak gradient measures 8.0 mmHg. Aortic valve area, by VTI measures 1.84 cm. Pulmonic Valve: The pulmonic valve was not well visualized. Pulmonic valve regurgitation is not visualized. No evidence of pulmonic stenosis. Aorta: The aortic root is normal in size and structure. Pulmonary Artery: Indeterminant PASP, inadequate TR jet. Venous: The inferior vena cava is normal in size with greater than 50% respiratory variability, suggesting right atrial pressure of 3 mmHg. IAS/Shunts: No atrial level shunt detected by color flow Doppler.  LEFT VENTRICLE PLAX 2D LVIDd:         5.92 cm  Diastology LVIDs:         4.96 cm  LV e' lateral:   7.37 cm/s LV PW:         1.20 cm  LV E/e' lateral: 16.2 LV IVS:        1.27 cm  LV e' medial:    3.49 cm/s LVOT diam:     2.00 cm  LV E/e' medial:  34.2 LV SV:         41 LV SV Index:   18 LVOT Area:     3.14 cm  RIGHT VENTRICLE RV S prime:     8.06 cm/s TAPSE (M-mode): 1.1 cm LEFT ATRIUM           Index LA diam:      4.50 cm 2.02 cm/m LA Vol (A2C): 90.4 ml  40.50 ml/m LA Vol (A4C): 76.7 ml 34.37 ml/m  AORTIC VALVE AV Area (Vmax):    1.76 cm AV Area (Vmean):   1.77 cm AV Area (VTI):     1.84 cm AV Vmax:           141.17 cm/s AV Vmean:          94.367 cm/s AV VTI:            0.224 m AV Peak Grad:      8.0 mmHg AV Mean Grad:      4.1 mmHg LVOT Vmax:         79.10 cm/s LVOT Vmean:        53.300 cm/s LVOT VTI:          0.131 m LVOT/AV VTI ratio: 0.58  AORTA Ao Root diam: 3.00 cm MITRAL VALVE                TRICUSPID VALVE MV Area (PHT): 4.82 cm     TR Peak grad:   38.2 mmHg MV Decel Time: 157 msec     TR Vmax:        309.00 cm/s MR Peak grad: 61.5 mmHg MR Mean grad: 42.0 mmHg     SHUNTS MR Vmax:      392.00 cm/s   Systemic VTI:  0.13 m  MR Vmean:     309.0 cm/s    Systemic Diam: 2.00 cm MV E velocity: 119.33 cm/s Carlyle Dolly MD Electronically signed by Carlyle Dolly MD Signature Date/Time: 04/24/2019/11:07:08 AM    Final     Cardiac Studies    Patient Profile     [name] [name] is a 64 y.o. male with past medical history of chronic combined systolic and diastolic CHF/NICM (EF 0000000 in 2013 with cath showing normal cors, EF 30-35% by repeat echo in 2015), HTN, HLD, Type 2 DM and Stage 3 CKD who is being seen today for the evaluation of CHF at the request of Dr. Dyann Kief.   Assessment & Plan    1. Acute on chronic systolic HF - History of chronic systolic HF LVEF [id number] by 2015 echo, has not followed up since 2016 in cardiology clinic. Cath in 2013 without significant disease, consistent with a NICM.  - 04/2019 echo LVEF 25-30%, mod RV dysfunction  - negative 2.7L  Yesterday, neg 5.1L since admission. He is on lasix 40mg  IV bid, stable Cr - medical therapy with coreg 3.125mg  bid. Start low dose losartan 12.5mg  daily.  - continue IV diuresis  2. LV thrombus - noted on echo this admit, apical thrombus - started on coumadin, on hep gtt - would treat 3 months and repeat echo, if resolved could change to DOAC for his afib  3. Afib - new diagnosis  this admission - rate controlled on coreg - in setting of afib with CHADS2Vasc score of 3, as well as a newly diagonsed LV thrombus he has been started on coumadin as the preferred agent - repeat echo in 69months, if resolved thrombus then could change to DOAC.   4. CKD 3 - stable Cr around 1.4 this admission     For questions or updates, please contact [place] Please consult [email or web address] for contact info under        Signed, Carlyle Dolly, MD  04/25/2019, 9:16 AM

## 2019-04-25 NOTE — Progress Notes (Signed)
PROGRESS NOTE  [name] [name] [id number] DOB: 1955/04/29 DOA: 04/23/2019 PCP: Lucia Gaskins, MD  Brief History:  64 year old male with a history of systolic and diastolic CHF (EF 0000000 in 0000000 with cath showing normal cors, EF 30-35% by repeat echo in 2015), HTN, HLD, Type 2 DM and Stage 3 CKD presenting with 3-day history of shortness of breath, dyspnea on exertion, lower extremity edema. Denies any associated chest pain or palpitations. Has been sleeping in the recliner most nights. Reports a baseline weight less than 220 lbs but does not weigh himself daily. Does not add extra salt to his food but does get take-out from local restaurants a few times each week. He reports his PCP stopped his fluid pill approximately 1 year ago due to him not needing it anymore and due to the risk of stressing his kidneys. He has been continued on Coreg 6.25mg  BID, Spironolactone 12.5mg  daily and Losartan 12.5mg  daily.   Initial labs show WBC 7.2, Hgb 12.8, platelets 191, Na+ 135, K+ 4.1 and creatinine 1.45 (no recent values available for comparison). Mg 1.7. BNP 1155. HS Troponin values flat at 42 and 38. TSH 4.363. Hgb A1c 7.3. COVID negative. CXR showed cardiomegaly with pulmonary vascular congestion and small right pleural effusion. EKG showed rate-controlled atrial fibrillation, HR 93 with RBBB.   He was started on IV furosemide 20 mg IV twice daily on admission.  Cardiology was consulted to assist with management.  Assessment/Plan: Acute respiratory failure with hypoxia -Secondary to pulmonary edema -Currently stable on 2 L nasal cannula -Wean oxygen for saturation greater 92%  Acute on chronic systolic CHF -[id number] echo EF 25-30%, global HK, moderate decreased RV function, apical thrombus noted -Appreciate cardiology follow-up -Continue IV furosemide 40 mg IV twice daily -Neg 8 lbs- NEG 5.1L -Continue carvedilol -Restart losartan if blood pressure able to tolerate  Apical  thrombus -Continue warfarin -Heparin bridge  Atrial fibrillation, paroxysmal -New diagnosis this admission -Rate controlled -CHADS-VASc = 3 -Started on warfarin for his LV thrombus -Repeat echo in 3 months, if thrombus resolved, could change to DOAC  CKD stage IIIa -Baseline creatinine 0.9-1.2 -Monitor with diuresis  Diabetes mellitus type 2 -Continue NovoLog sliding scale -04/23/2019 hemoglobin A1c 7.3 -Holding metformin  Hyperlipidemia -Continue statin  non-sustained Vtach -asymptomatic -will maintain Mg above 2 and K above 4 as much as possible -continue monitoring telemetry   Chronic pain syndrome- -continue home dose oxycodone -Continue gabapentin    Disposition Plan: Patient From: Home D/C Place: Home/SNF - 1-2  Days Barriers: Not Clinically Stable--remains fluid overloaded on IV lasix  Family Communication:No  Family at bedside  Consultants:  cardiology  Code Status:  FULL  DVT Prophylaxis: warfarin   Procedures: As Listed in Progress Note Above  Antibiotics: None       Subjective: Patient denies fevers, chills, headache, chest pain, dyspnea, nausea, vomiting, diarrhea, abdominal pain, dysuria, hematuria, hematochezia, and melena.   Objective: Vitals:   04/25/19 1033 04/25/19 1148 04/25/19 1245 04/25/19 1437  BP: (!) 91/59 92/60 106/74 104/70  Pulse: 88 76 80 93  Resp: (!) 21 (!) 24 (!) 24 18  Temp:    98.6 F (37 C)  TempSrc:      SpO2: 100% 100% 100% 100%  Weight:      Height:        Intake/Output Summary (Last 24 hours) at 04/25/2019 1624 Last data filed at 04/25/2019 1254 Gross per 24 hour  Intake 723 ml  Output 2700 ml  Net -1977 ml   Weight change: -3.62 kg Exam:   General:  Pt is alert, follows commands appropriately, not in acute distress  HEENT: No icterus, No thrush, No neck mass, [place]/AT  Cardiovascular: RRR, S1/S2, no rubs, no gallops +JVD  Respiratory: bibasilar crackles. No wheeze  Abdomen: Soft/+BS, non  tender, non distended, no guarding  Extremities: No edema, No lymphangitis, No petechiae, No rashes, no synovitis   Data Reviewed: I have personally reviewed following labs and imaging studies Basic Metabolic Panel: Recent Labs  Lab 04/23/19 1020 04/23/19 1334 04/24/19 0452 04/25/19 0449  NA 135  --  139 138  K 4.1  --  3.7 3.6  CL 103  --  104 103  CO2 21*  --  25 25  GLUCOSE 224*  --  117* 127*  BUN 16  --  17 21  CREATININE 1.45*  --  1.40* 1.45*  CALCIUM 8.6*  --  8.4* 8.4*  MG  --  1.7  --  1.7   Liver Function Tests: Recent Labs  Lab 04/23/19 1020  AST 44*  ALT 66*  ALKPHOS 100  BILITOT 0.8  PROT 6.5  ALBUMIN 3.5   No results for input(s): LIPASE, AMYLASE in the last 168 hours. No results for input(s): AMMONIA in the last 168 hours. Coagulation Profile: Recent Labs  Lab 04/24/19 1052 04/25/19 0449  INR 1.0 1.1   CBC: Recent Labs  Lab 04/23/19 1020 04/25/19 0449  WBC 7.2 5.5  HGB 12.8* 12.3*  HCT 39.3 37.4*  MCV 110.1* 108.1*  PLT 191 193   Cardiac Enzymes: No results for input(s): CKTOTAL, CKMB, CKMBINDEX, TROPONINI in the last 168 hours. BNP: Invalid input(s): POCBNP CBG: Recent Labs  Lab 04/24/19 1111 04/24/19 1637 04/24/19 2129 04/25/19 0716 04/25/19 1108  GLUCAP 237* 129* 142* 124* 202*   HbA1C: Recent Labs    04/23/19 1020  HGBA1C 7.3*   Urine analysis:    Component Value Date/Time   COLORURINE YELLOW 06/28/2006 1102   APPEARANCEUR CLEAR 06/28/2006 1102   LABSPEC 1.020 06/28/2006 1102   PHURINE 6.0 06/28/2006 1102   GLUCOSEU NEGATIVE 06/28/2006 1102   HGBUR NEGATIVE 06/28/2006 1102   BILIRUBINUR NEGATIVE 06/28/2006 1102   KETONESUR NEGATIVE 06/28/2006 1102   PROTEINUR NEGATIVE 06/28/2006 1102   UROBILINOGEN 1.0 06/28/2006 1102   NITRITE NEGATIVE 06/28/2006 1102   LEUKOCYTESUR  06/28/2006 1102    NEGATIVE MICROSCOPIC NOT DONE ON URINES WITH NEGATIVE PROTEIN, BLOOD, LEUKOCYTES, NITRITE, OR GLUCOSE <1000 mg/dL.    Sepsis Labs: @LABRCNTIP (procalcitonin:4,lacticidven:4) ) Recent Results (from the past 240 hour(s))  SARS CORONAVIRUS 2 (Tonie Elsey 6-24 HRS) Nasopharyngeal Nasopharyngeal Swab     Status: None   Collection Time: 04/23/19  1:23 PM   Specimen: Nasopharyngeal Swab  Result Value Ref Range Status   SARS Coronavirus 2 NEGATIVE NEGATIVE Final    Comment: (NOTE) SARS-CoV-2 target nucleic acids are NOT DETECTED. The SARS-CoV-2 RNA is generally detectable in upper and lower respiratory specimens during the acute phase of infection. Negative results do not preclude SARS-CoV-2 infection, do not rule out co-infections with other pathogens, and should not be used as the sole basis for treatment or other patient management decisions. Negative results must be combined with clinical observations, patient history, and epidemiological information. The expected result is Negative. Fact Sheet for Patients: SugarRoll.be Fact Sheet for Healthcare Providers: https://www.woods-mathews.com/ This test is not yet approved or cleared by the [place] FDA and  has been authorized for detection and/or diagnosis  of SARS-CoV-2 by FDA under an Emergency Use Authorization (EUA). This EUA will remain  in effect (meaning this test can be used) for the duration of the COVID-19 declaration under Section 56 4(b)(1) of the Act, 21 U.S.C. section 360bbb-3(b)(1), unless the authorization is terminated or revoked sooner. Performed at [facility], [place] [address], [place], [place] [address]      Scheduled Meds: . carvedilol  3.125 mg Oral QHS  . furosemide  40 mg Intravenous Q12H  . gabapentin  200 mg Oral TID  . insulin aspart  0-5 Units Subcutaneous QHS  . insulin aspart  0-6 Units Subcutaneous TID WC  . losartan  12.5 mg Oral Daily  . simvastatin  40 mg Oral QHS  . sodium chloride flush  3 mL Intravenous Q12H  . warfarin  10 mg Oral ONCE-1600  . Warfarin -  Pharmacist Dosing Inpatient   Does not apply q1600   Continuous Infusions: . sodium chloride    . heparin 1,600 Units/hr (04/25/19 0906)    Procedures/Studies: DG Chest Port 1 View  Result Date: 04/23/2019 CLINICAL DATA:  Shortness of breath EXAM: PORTABLE CHEST 1 VIEW COMPARISON:  September 08, 2012 FINDINGS: There is a small pleural effusion on the right with equivocal pleural effusion on the left. No edema or airspace opacity evident. There is cardiomegaly with pulmonary venous hypertension. No adenopathy. No bone lesions. IMPRESSION: Cardiomegaly with pulmonary vascular congestion. Small right pleural effusion with equivocal left pleural effusion. Lungs otherwise clear. No adenopathy. Electronically Signed   By: Lowella Grip III M.D.   On: 04/23/2019 11:13   ECHOCARDIOGRAM COMPLETE  Result Date: 04/24/2019    ECHOCARDIOGRAM REPORT   Patient Name:   [name] [name] [place] Date of Exam: 04/24/2019 Medical Rec #:  [id number]        Height:       72.0 in Accession #:    [id number]       Weight:       223.1 lb Date of Birth:  Oct 22, 1955       BSA:          2.232 m Patient Age:    89 years         BP:           109/81 mmHg Patient Gender: M                HR:           70 bpm. Exam Location:  [facility] Procedure: 2D Echo Indications:    CHF-Acute Systolic 123456 / [id number]  History:        Patient has prior history of Echocardiogram examinations, most                 recent 12/12/2013. CHF; Risk Factors:Hypertension, Dyslipidemia,                 Diabetes and Former Smoker. Chronic renal failure, stage 3a.  Sonographer:    Leavy Cella RDCS (AE) Referring Phys: [place]  1. The is a 1.0 x 0.92 cm apical thrombus that is semi mobile. . Left ventricular ejection fraction, by estimation, is 25 to 30%. The left ventricle has severely decreased function. The left ventricle demonstrates global hypokinesis. There is mild left ventricular hypertrophy. Left ventricular diastolic parameters are  indeterminate.  2. Right ventricular systolic function is moderately reduced. The right ventricular size is mildly enlarged. There is moderately elevated pulmonary artery systolic pressure.  3. Left atrial  size was severely dilated.  4. The mitral valve is normal in structure. No evidence of mitral valve regurgitation. No evidence of mitral stenosis.  5. The aortic valve is tricuspid. Aortic valve regurgitation is not visualized. No aortic stenosis is present.  6. Indeterminant PASP, inadequate TR jet.  7. The inferior vena cava is normal in size with greater than 50% respiratory variability, suggesting right atrial pressure of 3 mmHg. FINDINGS  Left Ventricle: The is a 1.0 x 0.92 cm apical thrombus that is semi mobile. Left ventricular ejection fraction, by estimation, is 25 to 30%. The left ventricle has severely decreased function. The left ventricle demonstrates global hypokinesis. The left  ventricular internal cavity size was normal in size. There is mild left ventricular hypertrophy. Left ventricular diastolic parameters are indeterminate. Right Ventricle: The right ventricular size is mildly enlarged. No increase in right ventricular wall thickness. Right ventricular systolic function is moderately reduced. There is moderately elevated pulmonary artery systolic pressure. The tricuspid regurgitant velocity is 3.09 m/s, and with an assumed right atrial pressure of 10 mmHg, the estimated right ventricular systolic pressure is 99991111 mmHg. Left Atrium: Left atrial size was severely dilated. Right Atrium: Right atrial size was normal in size. Pericardium: There is no evidence of pericardial effusion. Mitral Valve: The mitral valve is normal in structure. No evidence of mitral valve regurgitation. No evidence of mitral valve stenosis. Tricuspid Valve: The tricuspid valve is normal in structure. Tricuspid valve regurgitation is mild . No evidence of tricuspid stenosis. Aortic Valve: The aortic valve is tricuspid. .  There is mild thickening and mild calcification of the aortic valve. Aortic valve regurgitation is not visualized. No aortic stenosis is present. Mild aortic valve annular calcification. There is mild thickening of the aortic valve. There is mild calcification of the aortic valve. Aortic valve mean gradient measures 4.1 mmHg. Aortic valve peak gradient measures 8.0 mmHg. Aortic valve area, by VTI measures 1.84 cm. Pulmonic Valve: The pulmonic valve was not well visualized. Pulmonic valve regurgitation is not visualized. No evidence of pulmonic stenosis. Aorta: The aortic root is normal in size and structure. Pulmonary Artery: Indeterminant PASP, inadequate TR jet. Venous: The inferior vena cava is normal in size with greater than 50% respiratory variability, suggesting right atrial pressure of 3 mmHg. IAS/Shunts: No atrial level shunt detected by color flow Doppler.  LEFT VENTRICLE PLAX 2D LVIDd:         5.92 cm  Diastology LVIDs:         4.96 cm  LV e' lateral:   7.37 cm/s LV PW:         1.20 cm  LV E/e' lateral: 16.2 LV IVS:        1.27 cm  LV e' medial:    3.49 cm/s LVOT diam:     2.00 cm  LV E/e' medial:  34.2 LV SV:         41 LV SV Index:   18 LVOT Area:     3.14 cm  RIGHT VENTRICLE RV S prime:     8.06 cm/s TAPSE (M-mode): 1.1 cm LEFT ATRIUM           Index LA diam:      4.50 cm 2.02 cm/m LA Vol (A2C): 90.4 ml 40.50 ml/m LA Vol (A4C): 76.7 ml 34.37 ml/m  AORTIC VALVE AV Area (Vmax):    1.76 cm AV Area (Vmean):   1.77 cm AV Area (VTI):     1.84 cm AV Vmax:  141.17 cm/s AV Vmean:          94.367 cm/s AV VTI:            0.224 m AV Peak Grad:      8.0 mmHg AV Mean Grad:      4.1 mmHg LVOT Vmax:         79.10 cm/s LVOT Vmean:        53.300 cm/s LVOT VTI:          0.131 m LVOT/AV VTI ratio: 0.58  AORTA Ao Root diam: 3.00 cm MITRAL VALVE                TRICUSPID VALVE MV Area (PHT): 4.82 cm     TR Peak grad:   38.2 mmHg MV Decel Time: 157 msec     TR Vmax:        309.00 cm/s MR Peak grad: 61.5 mmHg  MR Mean grad: 42.0 mmHg     SHUNTS MR Vmax:      392.00 cm/s   Systemic VTI:  0.13 m MR Vmean:     309.0 cm/s    Systemic Diam: 2.00 cm MV E velocity: 119.33 cm/s Carlyle Dolly MD Electronically signed by Carlyle Dolly MD Signature Date/Time: 04/24/2019/11:07:08 AM    Final     Orson Eva, DO  [facility]  If 7PM-7AM, please contact night-coverage [email or web address] Password [email or web address] 04/25/2019, 4:24 PM   LOS: 2 days

## 2019-04-25 NOTE — Progress Notes (Signed)
10 beat run of V-tach.  Patient asymptomatic and vitals stable.  On-call MD notified via text page.  Will continue to monitor.

## 2019-04-25 NOTE — Progress Notes (Addendum)
ANTICOAGULATION CONSULT NOTE - Initial Consult  Pharmacy Consult for heparin gtt  Indication: apical thrombus  No Known Allergies  Patient Measurements: Height: 6' (182.9 cm) Weight: 99.8 kg (220 lb 0.3 oz) IBW/kg (Calculated) : 77.6 Heparin Dosing Weight: HEPARIN DW (KG): 98.6   Vital Signs: Temp: 98.3 F (36.8 C) (04/07 0607) Temp Source: Oral (04/07 0607) BP: 109/76 (04/07 0607) Pulse Rate: 83 (04/07 0607)  Labs: Recent Labs    04/23/19 1020 04/24/19 0452 04/24/19 1052 04/25/19 0449  HGB 12.8*  --   --  12.3*  HCT 39.3  --   --  37.4*  PLT 191  --   --  193  LABPROT  --   --  13.2 14.0  INR  --   --  1.0 1.1  CREATININE 1.45* 1.40*  --  1.45*    Estimated Creatinine Clearance: 64.6 mL/min (A) (by C-G formula based on SCr of 1.45 mg/dL (H)).   Medical History: Past Medical History:  Diagnosis Date  . Anxiety   . CHF (congestive heart failure) ([place])    a. EF 15% in 2013 with cath showing normal cors b. EF 30-35% by repeat echo in 2015  . Chronic systolic heart failure ([place])   . Diabetes mellitus   . Fluid retention in legs   . Hypertension     Medications:  Medications Prior to Admission  Medication Sig Dispense Refill Last Dose  . carvedilol (COREG) 6.25 MG tablet Take 6.25 mg by mouth at bedtime.   04/22/2019 at Unknown time  . gabapentin (NEURONTIN) 300 MG capsule Take 300 mg by mouth 3 (three) times daily.   04/22/2019 at Unknown time  . metFORMIN (GLUCOPHAGE) 500 MG tablet Take 500 mg by mouth 2 (two) times daily.   04/22/2019 at Unknown time  . naproxen (NAPROSYN) 500 MG tablet Take 500 mg by mouth 2 (two) times daily.   04/22/2019 at Unknown time  . Oxycodone HCl 10 MG TABS Take 10 mg by mouth 4 (four) times daily as needed.   04/22/2019 at Unknown time  . simvastatin (ZOCOR) 40 MG tablet Take 1 tablet (40 mg total) by mouth at bedtime. 90 tablet 1 04/22/2019 at Unknown time  . spironolactone (ALDACTONE) 25 MG tablet TAKE 1/2 TABLET BY MOUTH DAILY. 15 tablet 3  04/22/2019 at Unknown time  . ALPRAZolam (XANAX) 1 MG tablet Take 1 tablet (1 mg total) by mouth 2 (two) times daily as needed for anxiety. (Patient not taking: Reported on 04/23/2019) 60 tablet 0 Not Taking at Unknown time  . busPIRone (BUSPAR) 10 MG tablet Take 1 tablet (10 mg total) by mouth 2 (two) times daily. (Patient not taking: Reported on 04/23/2019) 60 tablet 1 Not Taking at Unknown time  . ketoconazole (NIZORAL) 2 % shampoo Apply 1 application topically 2 (two) times a week. (Patient not taking: Reported on 04/23/2019) 120 mL 1 Not Taking at Unknown time  . losartan (COZAAR) 25 MG tablet Take 0.5 tablets (12.5 mg total) by mouth daily. (Patient not taking: Reported on 04/23/2019) 45 tablet 0 Not Taking at Unknown time  . torsemide (DEMADEX) 20 MG tablet May take 20 mg extra daily for shortness of breath or leg swelling (Patient not taking: Reported on 04/23/2019) 45 tablet 3 Not Taking at Unknown time   Scheduled:  . carvedilol  3.125 mg Oral QHS  . furosemide  40 mg Intravenous Q12H  . gabapentin  200 mg Oral TID  . insulin aspart  0-5 Units Subcutaneous QHS  .  insulin aspart  0-6 Units Subcutaneous TID WC  . simvastatin  40 mg Oral QHS  . sodium chloride flush  3 mL Intravenous Q12H  . warfarin  10 mg Oral ONCE-1600  . Warfarin - Pharmacist Dosing Inpatient   Does not apply q1600   Infusions:  . sodium chloride    . heparin     PRN: sodium chloride, acetaminophen, guaiFENesin-dextromethorphan, ondansetron (ZOFRAN) IV, oxyCODONE, sodium chloride flush, traZODone Anti-infectives (From admission, onward)   None      Assessment: [name] [name] a 64 y.o. male requires anticoagulation with a heparin iv infusion for the indication of  apical thrombus. Heparin gtt will be started following pharmacy protocol per pharmacy consult. Patient is not on previous oral anticoagulant that will require aPTT/HL correlation before transitioning to only HL monitoring. Warfarin was started 4/6, baseline INR  1.0, now 1.1. Goal 2-3   Goal of Therapy:  INR 2-3 Heparin level 0.3-0.7 units/ml Monitor platelets by anticoagulation protocol: Yes   Plan:  Start heparin infusion at 1600 units/hr Check anti-Xa level in 6 hours and daily while on heparin Continue to monitor H&H and platelets Warfarin 10mg  po x 1  Monitor INR daily Monitor for signs and symptoms of bleeding  Heparin level to be drawn in 6 hours.  Donna Christen Sladen Plancarte 04/25/2019,8:10 AM

## 2019-04-25 NOTE — Progress Notes (Addendum)
ANTICOAGULATION CONSULT NOTE - Initial Up Consult   Pharmacy Consult for warfarin dosing  Indication: atrial fibrillation and LV thrombus    No Known Allergies    Patient Measurements: Last Weight  Most recent update: 04/25/2019  6:12 AM   Weight  99.8 kg (220 lb 0.3 oz)           Body mass index is 29.84 kg/m. [name] [name]               Temp: 98.3 F (36.8 C) (04/07 0607) Temp Source: Oral (04/07 0607) BP: 109/76 (04/07 0607) Pulse Rate: 83 (04/07 0607)  Labs: Recent Labs    04/23/19 1020 04/24/19 0452 04/24/19 1052 04/25/19 0449  HGB 12.8*  --   --  12.3*  HCT 39.3  --   --  37.4*  PLT 191  --   --  193  LABPROT  --   --  13.2 14.0  INR  --   --  1.0 1.1  CREATININE 1.45* 1.40*  --  1.45*    Estimated Creatinine Clearance: 64.6 mL/min (A) (by C-G formula based on SCr of 1.45 mg/dL (H)).     Medications:  Medications Prior to Admission  Medication Sig Dispense Refill Last Dose  . carvedilol (COREG) 6.25 MG tablet Take 6.25 mg by mouth at bedtime.   04/22/2019 at Unknown time  . gabapentin (NEURONTIN) 300 MG capsule Take 300 mg by mouth 3 (three) times daily.   04/22/2019 at Unknown time  . metFORMIN (GLUCOPHAGE) 500 MG tablet Take 500 mg by mouth 2 (two) times daily.   04/22/2019 at Unknown time  . naproxen (NAPROSYN) 500 MG tablet Take 500 mg by mouth 2 (two) times daily.   04/22/2019 at Unknown time  . Oxycodone HCl 10 MG TABS Take 10 mg by mouth 4 (four) times daily as needed.   04/22/2019 at Unknown time  . simvastatin (ZOCOR) 40 MG tablet Take 1 tablet (40 mg total) by mouth at bedtime. 90 tablet 1 04/22/2019 at Unknown time  . spironolactone (ALDACTONE) 25 MG tablet TAKE 1/2 TABLET BY MOUTH DAILY. 15 tablet 3 04/22/2019 at Unknown time  . ALPRAZolam (XANAX) 1 MG tablet Take 1 tablet (1 mg total) by mouth 2 (two) times daily as needed for anxiety. (Patient not taking: Reported on 04/23/2019) 60 tablet 0 Not Taking at Unknown time  . busPIRone (BUSPAR) 10 MG tablet Take  1 tablet (10 mg total) by mouth 2 (two) times daily. (Patient not taking: Reported on 04/23/2019) 60 tablet 1 Not Taking at Unknown time  . ketoconazole (NIZORAL) 2 % shampoo Apply 1 application topically 2 (two) times a week. (Patient not taking: Reported on 04/23/2019) 120 mL 1 Not Taking at Unknown time  . losartan (COZAAR) 25 MG tablet Take 0.5 tablets (12.5 mg total) by mouth daily. (Patient not taking: Reported on 04/23/2019) 45 tablet 0 Not Taking at Unknown time  . torsemide (DEMADEX) 20 MG tablet May take 20 mg extra daily for shortness of breath or leg swelling (Patient not taking: Reported on 04/23/2019) 45 tablet 3 Not Taking at Unknown time   Scheduled:  . carvedilol  3.125 mg Oral QHS  . furosemide  40 mg Intravenous Q12H  . gabapentin  200 mg Oral TID  . heparin  5,000 Units Subcutaneous Q8H  . insulin aspart  0-5 Units Subcutaneous QHS  . insulin aspart  0-6 Units Subcutaneous TID WC  . simvastatin  40 mg Oral QHS  . sodium chloride  flush  3 mL Intravenous Q12H  . Warfarin - Pharmacist Dosing Inpatient   Does not apply q1600   Infusions:  . sodium chloride     PRN: sodium chloride, acetaminophen, guaiFENesin-dextromethorphan, ondansetron (ZOFRAN) IV, oxyCODONE, sodium chloride flush, traZODone Anti-infectives (From admission, onward)   None      Goal of Therapy:  INR 2-3 Monitor platelets by anticoagulation protocol: Yes   Patient was not on warfarin prior to admission    Admit INR was 1.0 Lab Results  Component Value Date   INR 1.1 04/25/2019   INR 1.0 04/24/2019   INR 0.97 07/09/2011    Assessment: INR 1.1 from 1.0  [name] [name] a 64 y.o. male requires anticoagulation with warfarin for the indication of  atrial fibrillation and LV thrombus . Patient is also on heparin 5000units subq q8h.  Warfarin will be initiated inpatient following pharmacy protocol per pharmacy consult. Patient most recent blood work is as follows:  CBC Latest Ref Rng & Units 04/25/2019  04/23/2019 07/09/2012  WBC 4.0 - 10.5 K/uL 5.5 7.2 7.6  Hemoglobin 13.0 - 17.0 g/dL 12.3(L) 12.8(L) 13.6  Hematocrit 39.0 - 52.0 % 37.4(L) 39.3 40.1  Platelets 150 - 400 K/uL 193 191 185     Plan: Warfarin 10mg  po x 1  Monitor CBC MWF with am labs   Monitor INR daily Monitor for signs and symptoms of bleeding   [name] [name], PharmD, MBA, BCGP Clinical Pharmacist

## 2019-04-25 NOTE — Progress Notes (Signed)
Pt's B/P 91/59. Pt alert, oriented. Denies c/o. Skin warm and dry, other VSS. MD Tat notified, received order to hold scheduled Losartan.

## 2019-04-25 NOTE — Progress Notes (Signed)
Notified by Central Tele that pt had 9 beat run of V-Tach. Upon assessment of pt, pt sleeping soundly. Easily awakened, denies any c/o chest pain or SOB. Resp 24/min and regular, SaO2 100% on RA. Skin warm and dry. Manual B/P 92/60. Current heart rate 76 apical and regular. Charge nurse Evalyn Casco, RN notified and MD Tat paged by Amion to report rhythm finding by cental tele and current VS, as pt is at [facility] 2.

## 2019-04-25 NOTE — Progress Notes (Signed)
ANTICOAGULATION CONSULT NOTE - Initial Consult  Pharmacy Consult for heparin gtt  Indication: apical thrombus  No Known Allergies  Patient Measurements: Height: 6' (182.9 cm) Weight: 99.8 kg (220 lb 0.3 oz) IBW/kg (Calculated) : 77.6 Heparin Dosing Weight: HEPARIN DW (KG): 98.6   Vital Signs: Temp: 98.6 F (37 C) (04/07 1437) Temp Source: Oral (04/07 0607) BP: 104/70 (04/07 1437) Pulse Rate: 93 (04/07 1437)  Labs: Recent Labs    04/23/19 1020 04/24/19 0452 04/24/19 1052 04/25/19 0449 04/25/19 1334  HGB 12.8*  --   --  12.3*  --   HCT 39.3  --   --  37.4*  --   PLT 191  --   --  193  --   LABPROT  --   --  13.2 14.0  --   INR  --   --  1.0 1.1  --   HEPARINUNFRC  --   --   --   --  0.53  CREATININE 1.45* 1.40*  --  1.45*  --     Estimated Creatinine Clearance: 64.6 mL/min (A) (by C-G formula based on SCr of 1.45 mg/dL (H)).   Medical History: Past Medical History:  Diagnosis Date  . Anxiety   . CHF (congestive heart failure) ([place])    a. EF 15% in 2013 with cath showing normal cors b. EF 30-35% by repeat echo in 2015  . Chronic systolic heart failure ([place])   . Diabetes mellitus   . Fluid retention in legs   . Hypertension     Medications:  Medications Prior to Admission  Medication Sig Dispense Refill Last Dose  . carvedilol (COREG) 6.25 MG tablet Take 6.25 mg by mouth at bedtime.   04/22/2019 at Unknown time  . gabapentin (NEURONTIN) 300 MG capsule Take 300 mg by mouth 3 (three) times daily.   04/22/2019 at Unknown time  . metFORMIN (GLUCOPHAGE) 500 MG tablet Take 500 mg by mouth 2 (two) times daily.   04/22/2019 at Unknown time  . naproxen (NAPROSYN) 500 MG tablet Take 500 mg by mouth 2 (two) times daily.   04/22/2019 at Unknown time  . Oxycodone HCl 10 MG TABS Take 10 mg by mouth 4 (four) times daily as needed.   04/22/2019 at Unknown time  . simvastatin (ZOCOR) 40 MG tablet Take 1 tablet (40 mg total) by mouth at bedtime. 90 tablet 1 04/22/2019 at Unknown time  .  spironolactone (ALDACTONE) 25 MG tablet TAKE 1/2 TABLET BY MOUTH DAILY. 15 tablet 3 04/22/2019 at Unknown time  . ALPRAZolam (XANAX) 1 MG tablet Take 1 tablet (1 mg total) by mouth 2 (two) times daily as needed for anxiety. (Patient not taking: Reported on 04/23/2019) 60 tablet 0 Not Taking at Unknown time  . busPIRone (BUSPAR) 10 MG tablet Take 1 tablet (10 mg total) by mouth 2 (two) times daily. (Patient not taking: Reported on 04/23/2019) 60 tablet 1 Not Taking at Unknown time  . ketoconazole (NIZORAL) 2 % shampoo Apply 1 application topically 2 (two) times a week. (Patient not taking: Reported on 04/23/2019) 120 mL 1 Not Taking at Unknown time  . losartan (COZAAR) 25 MG tablet Take 0.5 tablets (12.5 mg total) by mouth daily. (Patient not taking: Reported on 04/23/2019) 45 tablet 0 Not Taking at Unknown time  . torsemide (DEMADEX) 20 MG tablet May take 20 mg extra daily for shortness of breath or leg swelling (Patient not taking: Reported on 04/23/2019) 45 tablet 3 Not Taking at Unknown time  Scheduled:  . carvedilol  3.125 mg Oral QHS  . furosemide  40 mg Intravenous Q12H  . gabapentin  200 mg Oral TID  . insulin aspart  0-5 Units Subcutaneous QHS  . insulin aspart  0-6 Units Subcutaneous TID WC  . losartan  12.5 mg Oral Daily  . simvastatin  40 mg Oral QHS  . sodium chloride flush  3 mL Intravenous Q12H  . warfarin  10 mg Oral ONCE-1600  . Warfarin - Pharmacist Dosing Inpatient   Does not apply q1600   Infusions:  . sodium chloride    . heparin 1,600 Units/hr (04/25/19 0906)   PRN: sodium chloride, acetaminophen, guaiFENesin-dextromethorphan, ondansetron (ZOFRAN) IV, oxyCODONE, sodium chloride flush, traZODone Anti-infectives (From admission, onward)   None      Assessment: [name] [name] a 64 y.o. male requires anticoagulation with a heparin iv infusion for the indication of  apical thrombus. Heparin gtt will be started following pharmacy protocol per pharmacy consult. Patient is not on  previous oral anticoagulant that will require aPTT/HL correlation before transitioning to only HL monitoring. Warfarin was started 4/6, baseline INR 1.0, now 1.1. Goal 2-3   HL 0.53 therapeutic   Goal of Therapy:  INR 2-3 Heparin level 0.3-0.7 units/ml Monitor platelets by anticoagulation protocol: Yes   Plan:  Continue heparin infusion at 1600 units/hr Check anti-Xa level daily while on heparin Continue to monitor H&H and platelets Warfarin 10mg  po x 1  Monitor INR daily Monitor for signs and symptoms of bleeding  [name] [name] 04/25/2019,3:16 PM

## 2019-04-26 LAB — GLUCOSE, CAPILLARY
Glucose-Capillary: 129 mg/dL — ABNORMAL HIGH (ref 70–99)
Glucose-Capillary: 184 mg/dL — ABNORMAL HIGH (ref 70–99)
Glucose-Capillary: 109 mg/dL — ABNORMAL HIGH (ref 70–99)
Glucose-Capillary: 143 mg/dL — ABNORMAL HIGH (ref 70–99)

## 2019-04-26 LAB — BASIC METABOLIC PANEL
Anion gap: 11 (ref 5–15)
BUN: 22 mg/dL (ref 8–23)
CO2: 25 mmol/L (ref 22–32)
Calcium: 8.6 mg/dL — ABNORMAL LOW (ref 8.9–10.3)
Chloride: 103 mmol/L (ref 98–111)
Creatinine, Ser: 1.29 mg/dL — ABNORMAL HIGH (ref 0.61–1.24)
GFR calc Af Amer: >60 mL/min (ref >60)
GFR calc non Af Amer: 59 mL/min — ABNORMAL LOW (ref >60)
Glucose, Bld: 129 mg/dL — ABNORMAL HIGH (ref 70–99)
Potassium: 3.3 mmol/L — ABNORMAL LOW (ref 3.5–5.1)
Sodium: 139 mmol/L (ref 135–145)

## 2019-04-26 LAB — CBC
HCT: 39.4 % (ref 39.0–52.0)
Hemoglobin: 13 g/dL (ref 13.0–17.0)
MCH: 35.1 pg — ABNORMAL HIGH (ref 26.0–34.0)
MCHC: 33 g/dL (ref 30.0–36.0)
MCV: 106.5 fL — ABNORMAL HIGH (ref 80.0–100.0)
Platelets: 217 10*3/uL (ref 150–400)
RBC: 3.7 MIL/uL — ABNORMAL LOW (ref 4.22–5.81)
RDW: 12.7 % (ref 11.5–15.5)
WBC: 7.1 10*3/uL (ref 4.0–10.5)
nRBC: 0 % (ref 0.0–0.2)

## 2019-04-26 LAB — PROTIME-INR
INR: 1.3 — ABNORMAL HIGH (ref 0.8–1.2)
Prothrombin Time: 15.7 seconds — ABNORMAL HIGH (ref 11.4–15.2)

## 2019-04-26 LAB — HEPARIN LEVEL (UNFRACTIONATED)
Heparin Unfractionated: 0.71 IU/mL — ABNORMAL HIGH (ref 0.30–0.70)
Heparin Unfractionated: 0.48 IU/mL (ref 0.30–0.70)

## 2019-04-26 LAB — MAGNESIUM: Magnesium: 1.9 mg/dL (ref 1.7–2.4)

## 2019-04-26 MED ORDER — FUROSEMIDE 10 MG/ML IJ SOLN
20.0000 mg | Freq: Once | INTRAMUSCULAR | Status: AC
  Administered 2019-04-26: 11:00:00 20 mg via INTRAVENOUS
  Filled 2019-04-26: qty 2

## 2019-04-26 MED ORDER — WARFARIN SODIUM 7.5 MG PO TABS: 12.5000 mg | ORAL_TABLET | Freq: Once | ORAL | Status: DC

## 2019-04-26 MED ORDER — POTASSIUM CHLORIDE CRYS ER 20 MEQ PO TBCR
40.0000 meq | EXTENDED_RELEASE_TABLET | ORAL | Status: AC
  Administered 2019-04-26 (×2): 40 meq via ORAL
  Filled 2019-04-26 (×2): qty 2

## 2019-04-26 MED ORDER — WARFARIN SODIUM 5 MG PO TABS
15.0000 mg | ORAL_TABLET | Freq: Once | ORAL | Status: AC
  Administered 2019-04-26: 15 mg via ORAL
  Filled 2019-04-26: qty 3

## 2019-04-26 MED ORDER — FUROSEMIDE 10 MG/ML IJ SOLN
60.0000 mg | Freq: Two times a day (BID) | INTRAMUSCULAR | Status: DC
  Administered 2019-04-26 – 2019-04-27 (×2): 60 mg via INTRAVENOUS
  Filled 2019-04-26 (×2): qty 6

## 2019-04-26 MED ORDER — MAGNESIUM SULFATE 2 GM/50ML IV SOLN
2.0000 g | Freq: Once | INTRAVENOUS | Status: AC
  Administered 2019-04-26: 18:00:00 2 g via INTRAVENOUS
  Filled 2019-04-26: qty 50

## 2019-04-26 NOTE — Progress Notes (Signed)
PROGRESS NOTE  NIJAY LANSING [id number] DOB: October 21, 1955 DOA: 04/23/2019 PCP: Lucia Gaskins, MD Brief History:  64 year old male with a history of systolic and diastolic CHF (EF 0000000 in 0000000 with cath showing normal cors,EF30-35% by repeat echo in 2015), HTN, HLD, Type 2 DM and Stage 3 CKD presenting with 3-day history of shortness of breath, dyspnea on exertion, lower extremity edema. Denies any associated chest pain or palpitations. Has been sleeping in the recliner most nights. Reports a baseline weight less than 220 lbs but does not weigh himself daily. Does not add extra salt to his food but does get take-out from local restaurants a few times each week. He reports his PCP stopped his fluid pill approximately 1 year ago due to him not needing it anymore and due to the risk of stressing his kidneys. He has been continued on Coreg 6.25mg  BID, Spironolactone 12.5mg  daily and Losartan 12.5mg  daily.  Initial labs show WBC 7.2, Hgb 12.8, platelets 191, Na+ 135, K+ 4.1 and creatinine 1.45 (no recent values available for comparison). Mg 1.7. BNP 1155. HS Troponin values flat at 42 and 38. TSH 4.363. Hgb A1c 7.3. COVID negative. CXR showed cardiomegaly with pulmonary vascular congestion and small right pleural effusion. EKG showedrate-controlled atrial fibrillation, HR 93 with RBBB.  He was started on IV furosemide 20 mg IV twice daily on admission.  Cardiology was consulted to assist with management.  Assessment/Plan: Acute respiratory failure with hypoxia -Secondary to pulmonary edema -Currently stable on 2 L nasal cannula -Wean oxygen for saturation greater 92%  Acute on chronic systolic CHF -[id number] echo EF 25-30%, global HK, moderate decreased RV function, apical thrombus noted -Appreciate cardiology follow-up -Increase IV furosemide 60 mg IV twice daily -Neg 8 lbs- NEG 6.1L -Continue carvedilol -Restart losartan if blood pressure able to tolerate  Apical  thrombus -Continue warfarin -Heparin bridge  Atrial fibrillation, paroxysmal -New diagnosis this admission -Rate controlled -CHADS-VASc = 3 -Started on warfarin for his LV thrombus -Repeat echo in 3 months, if thrombus resolved, could change to DOAC  CKD stage IIIa -Baseline creatinine 0.9-1.2 -Monitor with diuresis  Diabetes mellitus type 2 -Continue NovoLog sliding scale -04/23/2019 hemoglobin A1c 7.3 -Holding metformin  Hyperlipidemia -Continue statin  non-sustained Vtach -asymptomatic -will maintain Mg above 2 and K above 4 as much as possible -continue monitoring telemetry  Chronic pain syndrome- -continue home dose oxycodone -Continue gabapentin    Disposition Plan: Patient From: Home D/C Place: Home/SNF - 1-2  Days Barriers: Not Clinically Stable--remains fluid overloaded on IV lasix  Family Communication:No  Family at bedside  Consultants:  cardiology  Code Status:  FULL  DVT Prophylaxis: warfarin   Procedures: As Listed in Progress Note Above  Antibiotics: None       Subjective: Pt occasionally has "anxiety feeling" when laying in bed, lasting seconds.  No cp, no sob, no n/v, no dizziness.  Denies f/c, cough, dysuria, abd pain  Objective: Vitals:   04/26/19 0300 04/26/19 0559 04/26/19 1309 04/26/19 1501  BP:  105/61 90/68 94/77   Pulse:  (!) 50 84 75  Resp:  18 18 (!) 24  Temp:  98.1 F (36.7 C) 98.2 F (36.8 C) 99 F (37.2 C)  TempSrc:  Oral Oral Oral  SpO2:  100% 97% 98%  Weight: 97.8 kg     Height:        Intake/Output Summary (Last 24 hours) at 04/26/2019 1733 Last data filed at 04/26/2019 1455 Gross per  24 hour  Intake 813.2 ml  Output 1950 ml  Net -1136.8 ml   Weight change: -2 kg Exam:   General:  Pt is alert, follows commands appropriately, not in acute distress  HEENT: No icterus, No thrush, No neck mass, [place]/AT  Cardiovascular: IRRR, S1/S2, no rubs, no gallops  Respiratory: bibasilar crackles.  No wheeze  Abdomen: Soft/+BS, non tender, non distended, no guarding  Extremities: trac LE edema, No lymphangitis, No petechiae, No rashes, no synovitis   Data Reviewed: I have personally reviewed following labs and imaging studies Basic Metabolic Panel: Recent Labs  Lab 04/23/19 1020 04/23/19 1334 04/24/19 0452 04/25/19 0449 04/26/19 0527  NA 135  --  139 138 139  K 4.1  --  3.7 3.6 3.3*  CL 103  --  104 103 103  CO2 21*  --  25 25 25   GLUCOSE 224*  --  117* 127* 129*  BUN 16  --  17 21 22   CREATININE 1.45*  --  1.40* 1.45* 1.29*  CALCIUM 8.6*  --  8.4* 8.4* 8.6*  MG  --  1.7  --  1.7 1.9   Liver Function Tests: Recent Labs  Lab 04/23/19 1020  AST 44*  ALT 66*  ALKPHOS 100  BILITOT 0.8  PROT 6.5  ALBUMIN 3.5   No results for input(s): LIPASE, AMYLASE in the last 168 hours. No results for input(s): AMMONIA in the last 168 hours. Coagulation Profile: Recent Labs  Lab 04/24/19 1052 04/25/19 0449 04/26/19 0527  INR 1.0 1.1 1.3*   CBC: Recent Labs  Lab 04/23/19 1020 04/25/19 0449 04/26/19 0527  WBC 7.2 5.5 7.1  HGB 12.8* 12.3* 13.0  HCT 39.3 37.4* 39.4  MCV 110.1* 108.1* 106.5*  PLT 191 193 217   Cardiac Enzymes: No results for input(s): CKTOTAL, CKMB, CKMBINDEX, TROPONINI in the last 168 hours. BNP: Invalid input(s): POCBNP CBG: Recent Labs  Lab 04/25/19 1627 04/25/19 2138 04/26/19 0749 04/26/19 1106 04/26/19 1600  GLUCAP 169* 139* 129* 184* 109*   HbA1C: No results for input(s): HGBA1C in the last 72 hours. Urine analysis:    Component Value Date/Time   COLORURINE YELLOW 06/28/2006 1102   APPEARANCEUR CLEAR 06/28/2006 1102   LABSPEC 1.020 06/28/2006 1102   PHURINE 6.0 06/28/2006 1102   GLUCOSEU NEGATIVE 06/28/2006 1102   HGBUR NEGATIVE 06/28/2006 1102   BILIRUBINUR NEGATIVE 06/28/2006 1102   KETONESUR NEGATIVE 06/28/2006 1102   PROTEINUR NEGATIVE 06/28/2006 1102   UROBILINOGEN 1.0 06/28/2006 1102   NITRITE NEGATIVE 06/28/2006 1102    LEUKOCYTESUR  06/28/2006 1102    NEGATIVE MICROSCOPIC NOT DONE ON URINES WITH NEGATIVE PROTEIN, BLOOD, LEUKOCYTES, NITRITE, OR GLUCOSE <1000 mg/dL.   Sepsis Labs: @LABRCNTIP (procalcitonin:4,lacticidven:4) ) Recent Results (from the past 240 hour(s))  SARS CORONAVIRUS 2 (Caleigha Zale 6-24 HRS) Nasopharyngeal Nasopharyngeal Swab     Status: None   Collection Time: 04/23/19  1:23 PM   Specimen: Nasopharyngeal Swab  Result Value Ref Range Status   SARS Coronavirus 2 NEGATIVE NEGATIVE Final    Comment: (NOTE) SARS-CoV-2 target nucleic acids are NOT DETECTED. The SARS-CoV-2 RNA is generally detectable in upper and lower respiratory specimens during the acute phase of infection. Negative results do not preclude SARS-CoV-2 infection, do not rule out co-infections with other pathogens, and should not be used as the sole basis for treatment or other patient management decisions. Negative results must be combined with clinical observations, patient history, and epidemiological information. The expected result is Negative. Fact Sheet for Patients: SugarRoll.be Fact Sheet  for Healthcare Providers: https://www.woods-mathews.com/ This test is not yet approved or cleared by the [place] and  has been authorized for detection and/or diagnosis of SARS-CoV-2 by FDA under an Emergency Use Authorization (EUA). This EUA will remain  in effect (meaning this test can be used) for the duration of the COVID-19 declaration under Section 56 4(b)(1) of the Act, 21 U.S.C. section 360bbb-3(b)(1), unless the authorization is terminated or revoked sooner. Performed at [facility], [place] [address], [place], [place] [address]      Scheduled Meds: . carvedilol  3.125 mg Oral QHS  . furosemide  60 mg Intravenous Q12H  . gabapentin  200 mg Oral TID  . insulin aspart  0-5 Units Subcutaneous QHS  . insulin aspart  0-6 Units Subcutaneous TID WC  . losartan  12.5 mg  Oral Daily  . simvastatin  40 mg Oral QHS  . sodium chloride flush  3 mL Intravenous Q12H  . Warfarin - Pharmacist Dosing Inpatient   Does not apply q1600   Continuous Infusions: . sodium chloride    . heparin 1,500 Units/hr (04/26/19 1121)    Procedures/Studies: DG Chest Port 1 View  Result Date: 04/23/2019 CLINICAL DATA:  Shortness of breath EXAM: PORTABLE CHEST 1 VIEW COMPARISON:  September 08, 2012 FINDINGS: There is a small pleural effusion on the right with equivocal pleural effusion on the left. No edema or airspace opacity evident. There is cardiomegaly with pulmonary venous hypertension. No adenopathy. No bone lesions. IMPRESSION: Cardiomegaly with pulmonary vascular congestion. Small right pleural effusion with equivocal left pleural effusion. Lungs otherwise clear. No adenopathy. Electronically Signed   By: Lowella Grip III M.D.   On: 04/23/2019 11:13   ECHOCARDIOGRAM COMPLETE  Result Date: 04/24/2019    ECHOCARDIOGRAM REPORT   Patient Name:   CORTLANDT KOSKY Vaux Date of Exam: 04/24/2019 Medical Rec #:  [id number]        Height:       72.0 in Accession #:    [id number]       Weight:       223.1 lb Date of Birth:  16-Jun-1955       BSA:          2.232 m Patient Age:    87 years         BP:           109/81 mmHg Patient Gender: M                HR:           70 bpm. Exam Location:  [facility] Procedure: 2D Echo Indications:    CHF-Acute Systolic [id number] / [id number]  History:        Patient has prior history of Echocardiogram examinations, most                 recent 12/12/2013. CHF; Risk Factors:Hypertension, Dyslipidemia,                 Diabetes and Former Smoker. Chronic renal failure, stage 3a.  Sonographer:    Leavy Cella RDCS (AE) Referring Phys: [place]  1. The is a 1.0 x 0.92 cm apical thrombus that is semi mobile. . Left ventricular ejection fraction, by estimation, is 25 to 30%. The left ventricle has severely decreased function. The left ventricle demonstrates  global hypokinesis. There is mild left ventricular hypertrophy. Left ventricular diastolic parameters are indeterminate.  2. Right ventricular systolic function is moderately reduced. The  right ventricular size is mildly enlarged. There is moderately elevated pulmonary artery systolic pressure.  3. Left atrial size was severely dilated.  4. The mitral valve is normal in structure. No evidence of mitral valve regurgitation. No evidence of mitral stenosis.  5. The aortic valve is tricuspid. Aortic valve regurgitation is not visualized. No aortic stenosis is present.  6. Indeterminant PASP, inadequate TR jet.  7. The inferior vena cava is normal in size with greater than 50% respiratory variability, suggesting right atrial pressure of 3 mmHg. FINDINGS  Left Ventricle: The is a 1.0 x 0.92 cm apical thrombus that is semi mobile. Left ventricular ejection fraction, by estimation, is 25 to 30%. The left ventricle has severely decreased function. The left ventricle demonstrates global hypokinesis. The left  ventricular internal cavity size was normal in size. There is mild left ventricular hypertrophy. Left ventricular diastolic parameters are indeterminate. Right Ventricle: The right ventricular size is mildly enlarged. No increase in right ventricular wall thickness. Right ventricular systolic function is moderately reduced. There is moderately elevated pulmonary artery systolic pressure. The tricuspid regurgitant velocity is 3.09 m/s, and with an assumed right atrial pressure of 10 mmHg, the estimated right ventricular systolic pressure is 99991111 mmHg. Left Atrium: Left atrial size was severely dilated. Right Atrium: Right atrial size was normal in size. Pericardium: There is no evidence of pericardial effusion. Mitral Valve: The mitral valve is normal in structure. No evidence of mitral valve regurgitation. No evidence of mitral valve stenosis. Tricuspid Valve: The tricuspid valve is normal in structure. Tricuspid valve  regurgitation is mild . No evidence of tricuspid stenosis. Aortic Valve: The aortic valve is tricuspid. . There is mild thickening and mild calcification of the aortic valve. Aortic valve regurgitation is not visualized. No aortic stenosis is present. Mild aortic valve annular calcification. There is mild thickening of the aortic valve. There is mild calcification of the aortic valve. Aortic valve mean gradient measures 4.1 mmHg. Aortic valve peak gradient measures 8.0 mmHg. Aortic valve area, by VTI measures 1.84 cm. Pulmonic Valve: The pulmonic valve was not well visualized. Pulmonic valve regurgitation is not visualized. No evidence of pulmonic stenosis. Aorta: The aortic root is normal in size and structure. Pulmonary Artery: Indeterminant PASP, inadequate TR jet. Venous: The inferior vena cava is normal in size with greater than 50% respiratory variability, suggesting right atrial pressure of 3 mmHg. IAS/Shunts: No atrial level shunt detected by color flow Doppler.  LEFT VENTRICLE PLAX 2D LVIDd:         5.92 cm  Diastology LVIDs:         4.96 cm  LV e' lateral:   7.37 cm/s LV PW:         1.20 cm  LV E/e' lateral: 16.2 LV IVS:        1.27 cm  LV e' medial:    3.49 cm/s LVOT diam:     2.00 cm  LV E/e' medial:  34.2 LV SV:         41 LV SV Index:   18 LVOT Area:     3.14 cm  RIGHT VENTRICLE RV S prime:     8.06 cm/s TAPSE (M-mode): 1.1 cm LEFT ATRIUM           Index LA diam:      4.50 cm 2.02 cm/m LA Vol (A2C): 90.4 ml 40.50 ml/m LA Vol (A4C): 76.7 ml 34.37 ml/m  AORTIC VALVE AV Area (Vmax):    1.76 cm AV Area (Vmean):  1.77 cm AV Area (VTI):     1.84 cm AV Vmax:           141.17 cm/s AV Vmean:          94.367 cm/s AV VTI:            0.224 m AV Peak Grad:      8.0 mmHg AV Mean Grad:      4.1 mmHg LVOT Vmax:         79.10 cm/s LVOT Vmean:        53.300 cm/s LVOT VTI:          0.131 m LVOT/AV VTI ratio: 0.58  AORTA Ao Root diam: 3.00 cm MITRAL VALVE                TRICUSPID VALVE MV Area (PHT): 4.82 cm      TR Peak grad:   38.2 mmHg MV Decel Time: 157 msec     TR Vmax:        309.00 cm/s MR Peak grad: 61.5 mmHg MR Mean grad: 42.0 mmHg     SHUNTS MR Vmax:      392.00 cm/s   Systemic VTI:  0.13 m MR Vmean:     309.0 cm/s    Systemic Diam: 2.00 cm MV E velocity: 119.33 cm/s Carlyle Dolly MD Electronically signed by Carlyle Dolly MD Signature Date/Time: 04/24/2019/11:07:08 AM    Final     Orson Eva, DO  [facility]  If 7PM-7AM, please contact night-coverage [email or web address] Password [facility] 04/26/2019, 5:33 PM   LOS: 3 days

## 2019-04-26 NOTE — Progress Notes (Signed)
ANTICOAGULATION CONSULT NOTE - Follow-Up Consult  Pharmacy Consult for heparin gtt >> warfarin Indication: apical thrombus  No Known Allergies  Patient Measurements: Height: 6' (182.9 cm) Weight: 97.8 kg (215 lb 9.8 oz) IBW/kg (Calculated) : 77.6 Heparin Dosing Weight: HEPARIN DW (KG): 98.6   Vital Signs: Temp: 99 F (37.2 C) (04/08 1501) Temp Source: Oral (04/08 1501) BP: 112/82 (04/08 1739) Pulse Rate: 57 (04/08 1739)  Labs: Recent Labs    04/24/19 0452 04/24/19 1052 04/25/19 0449 04/25/19 1334 04/26/19 0527 04/26/19 0943 04/26/19 1819  HGB  --   --  12.3*  --  13.0  --   --   HCT  --   --  37.4*  --  39.4  --   --   PLT  --   --  193  --  217  --   --   LABPROT  --  13.2 14.0  --  15.7*  --   --   INR  --  1.0 1.1  --  1.3*  --   --   HEPARINUNFRC  --   --   --  0.53  --  0.71* 0.48  CREATININE 1.40*  --  1.45*  --  1.29*  --   --     Estimated Creatinine Clearance: 72 mL/min (A) (by C-G formula based on SCr of 1.29 mg/dL (H)).   Medical History: Past Medical History:  Diagnosis Date  . Anxiety   . CHF (congestive heart failure) ([place])    a. EF 15% in 2013 with cath showing normal cors b. EF 30-35% by repeat echo in 2015  . Chronic systolic heart failure ([place])   . Diabetes mellitus   . Fluid retention in legs   . Hypertension      Assessment: [name] [name] is a 64 y.o. male who was started on heparin >> warfarin anticoagulation for apical thrombus.   Heparin level is therapeutic on 1500 units/hr.  No bleeding noted.   Goal of Therapy:  INR 2-3 Heparin level 0.3-0.7 units/ml Monitor platelets by anticoagulation protocol: Yes   Plan:  Continue heparin infusion at 1500 units/hr  Check anti-Xa level daily while on heparin Monitor INR and CBC daily Monitor for signs and symptoms of bleeding  [employer], Pharm.D., BCPS Clinical Pharmacist  **Pharmacist phone directory can be found on [email or web address] listed under [place].  04/26/2019 7:19  PM

## 2019-04-26 NOTE — Progress Notes (Signed)
Tele called with report of pulse 189 for six seconds and 23 beat run of vtach. Patient denied any symptoms and vitals ok except temp 99 and resp 24.  Showing afib at 110 on monitor.  Texted Dr. Carles Collet with this information.

## 2019-04-26 NOTE — Progress Notes (Signed)
ANTICOAGULATION CONSULT NOTE - Initial Consult  Pharmacy Consult for heparin gtt  Indication: apical thrombus  No Known Allergies  Patient Measurements: Height: 6' (182.9 cm) Weight: 97.8 kg (215 lb 9.8 oz) IBW/kg (Calculated) : 77.6 Heparin Dosing Weight: HEPARIN DW (KG): 98.6   Vital Signs: Temp: 98.1 F (36.7 C) (04/08 0559) Temp Source: Oral (04/08 0559) BP: 105/61 (04/08 0559) Pulse Rate: 50 (04/08 0559)  Labs: Recent Labs    04/24/19 0452 04/24/19 1052 04/25/19 0449 04/25/19 1334 04/26/19 0527 04/26/19 0943  HGB  --   --  12.3*  --  13.0  --   HCT  --   --  37.4*  --  39.4  --   PLT  --   --  193  --  217  --   LABPROT  --  13.2 14.0  --  15.7*  --   INR  --  1.0 1.1  --  1.3*  --   HEPARINUNFRC  --   --   --  0.53  --  0.71*  CREATININE 1.40*  --  1.45*  --  1.29*  --     Estimated Creatinine Clearance: 72 mL/min (A) (by C-G formula based on SCr of 1.29 mg/dL (H)).   Medical History: Past Medical History:  Diagnosis Date  . Anxiety   . CHF (congestive heart failure) ([place])    a. EF 15% in 2013 with cath showing normal cors b. EF 30-35% by repeat echo in 2015  . Chronic systolic heart failure ([place])   . Diabetes mellitus   . Fluid retention in legs   . Hypertension      Assessment: [name] [name] a 64 y.o. male requires anticoagulation with a heparin iv infusion for the indication of  apical thrombus. Heparin gtt will be started following pharmacy protocol per pharmacy consult. Patient is not on previous oral anticoagulant that will require aPTT/HL correlation before transitioning to only HL monitoring. Warfarin was started 4/6, baseline INR 1.0, now 1.1.   04/26/19 1100 update: Heparin level: 0.71 IU/mL, slightly above high end of goal range on heparin at 1600 units/hr INR: 1.3 after two doses of warfarin 10mg  CBC: remains WNL RN reports no issues with bleeding or infusion site.   Goal of Therapy:  INR 2-3 Heparin level 0.3-0.7 units/ml Monitor  platelets by anticoagulation protocol: Yes   Plan:  Decrease heparin infusion rate to  1500 units/hr to make sure HL remains within range. Re-check HL ~1800 today Give warfarin 15 mg po x 1 today (50% boost) Check anti-Xa level daily while on heparin Continue to monitor H&H and platelets  Monitor INR and CBC daily Monitor for signs and symptoms of bleeding  Despina Pole 04/26/2019,11:13 AM

## 2019-04-26 NOTE — Progress Notes (Signed)
Progress Note  Patient Name: [name] [name] Date of Encounter: 04/26/2019  Primary Cardiologist: Kate Sable, MD   Subjective   SOB improving.   Inpatient Medications    Scheduled Meds: . carvedilol  3.125 mg Oral QHS  . furosemide  40 mg Intravenous Q12H  . gabapentin  200 mg Oral TID  . insulin aspart  0-5 Units Subcutaneous QHS  . insulin aspart  0-6 Units Subcutaneous TID WC  . losartan  12.5 mg Oral Daily  . simvastatin  40 mg Oral QHS  . sodium chloride flush  3 mL Intravenous Q12H  . warfarin  12.5 mg Oral ONCE-1600  . Warfarin - Pharmacist Dosing Inpatient   Does not apply q1600   Continuous Infusions: . sodium chloride    . heparin 1,600 Units/hr (04/26/19 0345)   PRN Meds: sodium chloride, acetaminophen, diphenhydrAMINE, guaiFENesin-dextromethorphan, ondansetron (ZOFRAN) IV, oxyCODONE, sodium chloride flush   Vital Signs    Vitals:   04/25/19 1627 04/25/19 2142 04/26/19 0300 04/26/19 0559  BP: 104/80 107/73  105/61  Pulse: 93 85  (!) 50  Resp: 20 20  18   Temp:  98.3 F (36.8 C)  98.1 F (36.7 C)  TempSrc:  Oral  Oral  SpO2: 100% 100%  100%  Weight:   97.8 kg   Height:        Intake/Output Summary (Last 24 hours) at 04/26/2019 1031 Last data filed at 04/26/2019 0900 Gross per 24 hour  Intake 1053.2 ml  Output 1650 ml  Net -596.8 ml   Last 3 Weights 04/26/2019 04/25/2019 04/24/2019  Weight (lbs) 215 lb 9.8 oz 220 lb 0.3 oz 223 lb 1.7 oz  Weight (kg) 97.8 kg 99.8 kg 101.2 kg      Telemetry    Afib, short runs NSVT - Personally Reviewed  ECG    n/a - Personally Reviewed  Physical Exam   GEN: No acute distress.   Neck: elevated JVD Cardiac: irreg, no murmurs, rubs, or gallops.  Respiratory: Clear to auscultation bilaterally. GI: Soft, nontender, non-distended  MS: No edema; No deformity. Neuro:  Nonfocal  Psych: Normal affect   Labs    High Sensitivity Troponin:   Recent Labs  Lab 04/23/19 1020 04/23/19 1532  TROPONINIHS 42*  38*      Chemistry Recent Labs  Lab 04/23/19 1020 04/23/19 1020 04/24/19 0452 04/25/19 0449 04/26/19 0527  NA 135   < > 139 138 139  K 4.1   < > 3.7 3.6 3.3*  CL 103   < > 104 103 103  CO2 21*   < > 25 25 25   GLUCOSE 224*   < > 117* 127* 129*  BUN 16   < > 17 21 22   CREATININE 1.45*   < > 1.40* 1.45* 1.29*  CALCIUM 8.6*   < > 8.4* 8.4* 8.6*  PROT 6.5  --   --   --   --   ALBUMIN 3.5  --   --   --   --   AST 44*  --   --   --   --   ALT 66*  --   --   --   --   ALKPHOS 100  --   --   --   --   BILITOT 0.8  --   --   --   --   GFRNONAA 51*   < > 53* 51* 59*  GFRAA 59*   < > >60 59* >60  ANIONGAP  11   < > 10 10 11    < > = values in this interval not displayed.     Hematology Recent Labs  Lab 04/23/19 1020 04/25/19 0449 04/26/19 0527  WBC 7.2 5.5 7.1  RBC 3.57* 3.46* 3.70*  HGB 12.8* 12.3* 13.0  HCT 39.3 37.4* 39.4  MCV 110.1* 108.1* 106.5*  MCH 35.9* 35.5* 35.1*  MCHC 32.6 32.9 33.0  RDW 12.8 12.8 12.7  PLT 191 193 217    BNP Recent Labs  Lab 04/23/19 1020  BNP 1,155.0*     DDimer No results for input(s): DDIMER in the last 168 hours.   Radiology    No results found.  Cardiac Studies     Patient Profile     Kaliel Porcella Withersis a 64 y.o.malewith past medical history of chronic combined systolic and diastolic CHF/NICM (EF 0000000 in 2013 with cath showing normal cors,EF30-35% by repeat echo in 2015), HTN, HLD, Type 2 DM and Stage 3 CKDwho is being seen today for the evaluation ofCHFat the request of Dr. Dyann Kief.   Assessment & Plan    1. Acute on chronic systolic HF - History of chronic systolic HF LVEF [id number] by 2015 echo, has not followed up since 2016 in cardiology clinic. Cath in 2013 without significant disease, consistent with a NICM.  - 04/2019 echo LVEF 25-30%, mod RV dysfunction  - negative 1 L  Yesterday, neg 6.2L since admission. He is on lasix 40mg  IV bid, downtrend in Cr.  - medical therapy with coreg 3.125mg  bid, losartan 12.5mg   daily  - remains volume overloaded, diuresis slowing down. Increase lasix to IV 60mg  bid.     2. LV thrombus - noted on echo this admit, apical thrombus - started on coumadin, on hep gtt - would treat 3 months and repeat echo, if resolved could change to DOAC for his afib  3. Afib - new diagnosis this admission - rate controlled on coreg - in setting of afib with CHADS2Vasc score of 3, as well as a newly diagonsed LV thrombus he has been started on coumadin as the preferred agent - repeat echo in 41months, if resolved thrombus then could change to DOAC.   4. CKD 3 - stable Cr around 1.4 this admission   5. NSVT - in setting of systolic dysfunction, NICM - keep K at 4, Mg at 2 - I have written for K replacement.   For questions or updates, please contact [place] Please consult [email or web address] for contact info under        Signed, Carlyle Dolly, MD  04/26/2019, 10:31 AM

## 2019-04-27 DIAGNOSIS — N1831 Chronic kidney disease, stage 3a: Secondary | ICD-10-CM | POA: Diagnosis not present

## 2019-04-27 DIAGNOSIS — J9601 Acute respiratory failure with hypoxia: Secondary | ICD-10-CM | POA: Diagnosis not present

## 2019-04-27 LAB — CBC
HCT: 42.6 % (ref 39.0–52.0)
Hemoglobin: 14.3 g/dL (ref 13.0–17.0)
MCH: 35.5 pg — ABNORMAL HIGH (ref 26.0–34.0)
MCHC: 33.6 g/dL (ref 30.0–36.0)
MCV: 105.7 fL — ABNORMAL HIGH (ref 80.0–100.0)
Platelets: 229 10*3/uL (ref 150–400)
RBC: 4.03 MIL/uL — ABNORMAL LOW (ref 4.22–5.81)
RDW: 12.6 % (ref 11.5–15.5)
WBC: 6.9 10*3/uL (ref 4.0–10.5)
nRBC: 0 % (ref 0.0–0.2)

## 2019-04-27 LAB — BASIC METABOLIC PANEL
Anion gap: 12 (ref 5–15)
BUN: 26 mg/dL — ABNORMAL HIGH (ref 8–23)
CO2: 25 mmol/L (ref 22–32)
Calcium: 9 mg/dL (ref 8.9–10.3)
Chloride: 102 mmol/L (ref 98–111)
Creatinine, Ser: 1.36 mg/dL — ABNORMAL HIGH (ref 0.61–1.24)
GFR calc Af Amer: >60 mL/min (ref >60)
GFR calc non Af Amer: 55 mL/min — ABNORMAL LOW (ref >60)
Glucose, Bld: 138 mg/dL — ABNORMAL HIGH (ref 70–99)
Potassium: 3.8 mmol/L (ref 3.5–5.1)
Sodium: 139 mmol/L (ref 135–145)

## 2019-04-27 LAB — GLUCOSE, CAPILLARY
Glucose-Capillary: 153 mg/dL — ABNORMAL HIGH (ref 70–99)
Glucose-Capillary: 164 mg/dL — ABNORMAL HIGH (ref 70–99)

## 2019-04-27 LAB — MAGNESIUM: Magnesium: 2.1 mg/dL (ref 1.7–2.4)

## 2019-04-27 LAB — HEPARIN LEVEL (UNFRACTIONATED): Heparin Unfractionated: 0.51 IU/mL (ref 0.30–0.70)

## 2019-04-27 LAB — PROTIME-INR
INR: 1.7 — ABNORMAL HIGH (ref 0.8–1.2)
Prothrombin Time: 19.9 seconds — ABNORMAL HIGH (ref 11.4–15.2)

## 2019-04-27 MED ORDER — WARFARIN SODIUM 7.5 MG PO TABS: 7.5000 mg | ORAL_TABLET | Freq: Every day | ORAL | Status: DC

## 2019-04-27 MED ORDER — TORSEMIDE 20 MG PO TABS: 40.0000 mg | ORAL_TABLET | Freq: Every day | ORAL | 1 refills | Status: DC

## 2019-04-27 MED ORDER — METOPROLOL SUCCINATE ER 25 MG PO TB24: 37.5000 mg | ORAL_TABLET | Freq: Every day | ORAL | 1 refills | Status: DC

## 2019-04-27 MED ORDER — POTASSIUM CHLORIDE CRYS ER 20 MEQ PO TBCR
40.0000 meq | EXTENDED_RELEASE_TABLET | Freq: Every day | ORAL | Status: DC
  Administered 2019-04-27: 10:00:00 40 meq via ORAL
  Filled 2019-04-27: qty 2

## 2019-04-27 MED ORDER — WARFARIN SODIUM 2.5 MG PO TABS: 7.5000 mg | ORAL_TABLET | Freq: Every day | ORAL | 1 refills | Status: DC

## 2019-04-27 MED ORDER — GABAPENTIN 100 MG PO CAPS: 200.0000 mg | ORAL_CAPSULE | Freq: Three times a day (TID) | ORAL | 1 refills | Status: DC

## 2019-04-27 MED ORDER — METOPROLOL SUCCINATE ER 25 MG PO TB24: 37.5000 mg | ORAL_TABLET | Freq: Every day | ORAL | Status: DC

## 2019-04-27 MED ORDER — TORSEMIDE 20 MG PO TABS: 40.0000 mg | ORAL_TABLET | Freq: Every day | ORAL | Status: DC

## 2019-04-27 MED ORDER — METOPROLOL SUCCINATE ER 25 MG PO TB24
37.5000 mg | ORAL_TABLET | Freq: Every day | ORAL | Status: DC
  Administered 2019-04-27: 37.5 mg via ORAL
  Filled 2019-04-27: qty 2

## 2019-04-27 MED ORDER — WARFARIN SODIUM 5 MG PO TABS
10.0000 mg | ORAL_TABLET | Freq: Once | ORAL | Status: AC
  Administered 2019-04-27: 16:00:00 10 mg via ORAL
  Filled 2019-04-27: qty 2

## 2019-04-27 NOTE — Discharge Summary (Signed)
Physician Discharge Summary  DARIYON BARINGER [id number] DOB: 1956/01/01 DOA: 04/23/2019  PCP: Lucia Gaskins, MD  Admit date: 04/23/2019 Discharge date: 04/27/2019  Admitted From: Home Disposition:  Home   Recommendations for Outpatient Follow-up:  1. Follow up with PCP in 1-2 weeks 2. Please obtain BMP/CBC in one week 3. Please check INR on 04/30/19 and adjust coumadin for INR 2-3-->[facility] [place]   Discharge Condition: Stable CODE STATUS: FULL Diet recommendation: Heart Healthy / Carb Modified   Brief/Interim Summary: 64 year old male with a history of systolic and diastolic CHF(EF 0000000 in 0000000 with cath showing normal cors,EF30-35% by repeat echo in 2015), HTN, HLD, Type 2 DM and Stage 3 CKDpresenting with 3-day history of shortness of breath, dyspnea on exertion, lower extremity edema.Denies any associated chest pain or palpitations. Has been sleeping in the recliner most nights. Reports a baseline weight less than 220 lbs but does not weigh himself daily. Does not add extra salt to his food but does get take-out from local restaurants a few times each week. He reports his PCP stopped his fluid pill approximately 1 year ago due to him not needing it anymore and due to the risk of stressing his kidneys. He has been continued on Coreg 6.25mg  BID, Spironolactone 12.5mg  daily and Losartan 12.5mg  daily.  Initial labs show WBC 7.2, Hgb 12.8, platelets 191, Na+ 135, K+ 4.1 and creatinine 1.45 (no recent values available for comparison). Mg 1.7. BNP 1155. HS Troponin values flat at 42 and 38. TSH 4.363. Hgb A1c 7.3. COVID negative. CXR showed cardiomegaly with pulmonary vascular congestion and small right pleural effusion. EKG showedrate-controlled atrial fibrillation, HR 93 with RBBB.He was started on IV furosemide 20 mg IV twice daily on admission. Cardiology was consulted to assist with management.  Discharge Diagnoses:  Acute respiratory failure with  hypoxia -Secondary to pulmonary edema -Currently stable on 2 L nasal cannula>>>RA -Wean oxygen for saturation greater 92%  Acute on chronic systolic CHF -[id number] echo EF 25-30%, global HK, moderate decreased RV function, apical thrombus noted -Appreciate cardiology follow-up -Increase IV furosemide 60 mg IV twice daily -discharge weight 213 lsbs -NEG 7L -carvedilol changed to metoprolol succinate 37.5 mg due to NS Vtach -Restart losartan 12.5 mg daily  Apical thrombus -Continue warfarin -start coumadin 7.5 mg daily on 4/10-->obtain INR on 4/12   Atrial fibrillation,paroxysmal -New diagnosis this admission -Rate controlled -CHADS-VASc = 3 -Started on warfarin for his LV thrombus -Repeat echo in 3 months, if thrombus resolved, could change toDOAC  CKD stage IIIa -Baseline creatinine 0.9-1.2 -Monitor with diuresis -serum creatinine 1.36 on day of d/c  Diabetes mellitus type 2 -Continue NovoLog sliding scale -04/23/2019 hemoglobin A1c 7.3 -Holding metformin--restart after d/c  Hyperlipidemia -Continue statin  non-sustained Vtach -asymptomatic -will maintain Mg above 2 and K above 4 as much as possible -continue monitoring telemetry  Chronic pain syndrome- -continue home dose oxycodone -Continue gabapentin   Discharge Instructions   Allergies as of 04/27/2019   No Known Allergies     Medication List    STOP taking these medications   ALPRAZolam 1 MG tablet Commonly known as: XANAX   busPIRone 10 MG tablet Commonly known as: BUSPAR   carvedilol 6.25 MG tablet Commonly known as: COREG   ketoconazole 2 % shampoo Commonly known as: Nizoral   naproxen 500 MG tablet Commonly known as: NAPROSYN   spironolactone 25 MG tablet Commonly known as: ALDACTONE     TAKE these medications   gabapentin 100 MG  capsule Commonly known as: NEURONTIN Take 2 capsules (200 mg total) by mouth 3 (three) times daily. What changed:   medication  strength  how much to take   losartan 25 MG tablet Commonly known as: COZAAR Take 0.5 tablets (12.5 mg total) by mouth daily.   metFORMIN 500 MG tablet Commonly known as: GLUCOPHAGE Take 500 mg by mouth 2 (two) times daily.   metoprolol succinate 25 MG 24 hr tablet Commonly known as: TOPROL-XL Take 1.5 tablets (37.5 mg total) by mouth daily. Start taking on: April 28, 2019   Oxycodone HCl 10 MG Tabs Take 10 mg by mouth 4 (four) times daily as needed.   simvastatin 40 MG tablet Commonly known as: ZOCOR Take 1 tablet (40 mg total) by mouth at bedtime.   torsemide 20 MG tablet Commonly known as: DEMADEX Take 2 tablets (40 mg total) by mouth daily. Start taking on: April 28, 2019 What changed:   how much to take  how to take this  when to take this  additional instructions   warfarin 2.5 MG tablet Commonly known as: COUMADIN Take 3 tablets (7.5 mg total) by mouth daily at 4 PM. Start taking on: April 28, 2019      Follow-up Information    Herminio Commons, MD Follow up on 05/11/2019.   Specialty: Cardiology Why: at 1:30 pm with his PA [place] information: [place] [address] [phone]        Herminio Commons, MD Follow up.   Specialty: Cardiology Why: 04/30/19 at 10AM for coumadin/INR check Contact information: [place] [id number] [phone]          No Known Allergies  Consultations:  cardiology   Procedures/Studies: DG Chest Port 1 View  Result Date: 04/23/2019 CLINICAL DATA:  Shortness of breath EXAM: PORTABLE CHEST 1 VIEW COMPARISON:  September 08, 2012 FINDINGS: There is a small pleural effusion on the right with equivocal pleural effusion on the left. No edema or airspace opacity evident. There is cardiomegaly with pulmonary venous hypertension. No adenopathy. No bone lesions. IMPRESSION: Cardiomegaly with pulmonary vascular congestion. Small right pleural effusion with  equivocal left pleural effusion. Lungs otherwise clear. No adenopathy. Electronically Signed   By: Lowella Grip III M.D.   On: 04/23/2019 11:13   ECHOCARDIOGRAM COMPLETE  Result Date: 04/24/2019    ECHOCARDIOGRAM REPORT   Patient Name:   POSEIDON SCHRAUTH Holstad Date of Exam: 04/24/2019 Medical Rec #:  [id number]        Height:       72.0 in Accession #:    [id number]       Weight:       223.1 lb Date of Birth:  1955-08-10       BSA:          2.232 m Patient Age:    64 years         BP:           109/81 mmHg Patient Gender: M                HR:           70 bpm. Exam Location:  [facility] Procedure: 2D Echo Indications:    CHF-Acute Systolic 123456 / [id number]  History:        Patient has prior history of Echocardiogram examinations, most                 recent  12/12/2013. CHF; Risk Factors:Hypertension, Dyslipidemia,                 Diabetes and Former Smoker. Chronic renal failure, stage 3a.  Sonographer:    Leavy Cella RDCS (AE) Referring Phys: [place]  1. The is a 1.0 x 0.92 cm apical thrombus that is semi mobile. . Left ventricular ejection fraction, by estimation, is 25 to 30%. The left ventricle has severely decreased function. The left ventricle demonstrates global hypokinesis. There is mild left ventricular hypertrophy. Left ventricular diastolic parameters are indeterminate.  2. Right ventricular systolic function is moderately reduced. The right ventricular size is mildly enlarged. There is moderately elevated pulmonary artery systolic pressure.  3. Left atrial size was severely dilated.  4. The mitral valve is normal in structure. No evidence of mitral valve regurgitation. No evidence of mitral stenosis.  5. The aortic valve is tricuspid. Aortic valve regurgitation is not visualized. No aortic stenosis is present.  6. Indeterminant PASP, inadequate TR jet.  7. The inferior vena cava is normal in size with greater than 50% respiratory variability, suggesting right atrial pressure of  3 mmHg. FINDINGS  Left Ventricle: The is a 1.0 x 0.92 cm apical thrombus that is semi mobile. Left ventricular ejection fraction, by estimation, is 25 to 30%. The left ventricle has severely decreased function. The left ventricle demonstrates global hypokinesis. The left  ventricular internal cavity size was normal in size. There is mild left ventricular hypertrophy. Left ventricular diastolic parameters are indeterminate. Right Ventricle: The right ventricular size is mildly enlarged. No increase in right ventricular wall thickness. Right ventricular systolic function is moderately reduced. There is moderately elevated pulmonary artery systolic pressure. The tricuspid regurgitant velocity is 3.09 m/s, and with an assumed right atrial pressure of 10 mmHg, the estimated right ventricular systolic pressure is 99991111 mmHg. Left Atrium: Left atrial size was severely dilated. Right Atrium: Right atrial size was normal in size. Pericardium: There is no evidence of pericardial effusion. Mitral Valve: The mitral valve is normal in structure. No evidence of mitral valve regurgitation. No evidence of mitral valve stenosis. Tricuspid Valve: The tricuspid valve is normal in structure. Tricuspid valve regurgitation is mild . No evidence of tricuspid stenosis. Aortic Valve: The aortic valve is tricuspid. . There is mild thickening and mild calcification of the aortic valve. Aortic valve regurgitation is not visualized. No aortic stenosis is present. Mild aortic valve annular calcification. There is mild thickening of the aortic valve. There is mild calcification of the aortic valve. Aortic valve mean gradient measures 4.1 mmHg. Aortic valve peak gradient measures 8.0 mmHg. Aortic valve area, by VTI measures 1.84 cm. Pulmonic Valve: The pulmonic valve was not well visualized. Pulmonic valve regurgitation is not visualized. No evidence of pulmonic stenosis. Aorta: The aortic root is normal in size and structure. Pulmonary Artery:  Indeterminant PASP, inadequate TR jet. Venous: The inferior vena cava is normal in size with greater than 50% respiratory variability, suggesting right atrial pressure of 3 mmHg. IAS/Shunts: No atrial level shunt detected by color flow Doppler.  LEFT VENTRICLE PLAX 2D LVIDd:         5.92 cm  Diastology LVIDs:         4.96 cm  LV e' lateral:   7.37 cm/s LV PW:         1.20 cm  LV E/e' lateral: 16.2 LV IVS:        1.27 cm  LV e' medial:    3.49 cm/s  LVOT diam:     2.00 cm  LV E/e' medial:  34.2 LV SV:         41 LV SV Index:   18 LVOT Area:     3.14 cm  RIGHT VENTRICLE RV S prime:     8.06 cm/s TAPSE (M-mode): 1.1 cm LEFT ATRIUM           Index LA diam:      4.50 cm 2.02 cm/m LA Vol (A2C): 90.4 ml 40.50 ml/m LA Vol (A4C): 76.7 ml 34.37 ml/m  AORTIC VALVE AV Area (Vmax):    1.76 cm AV Area (Vmean):   1.77 cm AV Area (VTI):     1.84 cm AV Vmax:           141.17 cm/s AV Vmean:          94.367 cm/s AV VTI:            0.224 m AV Peak Grad:      8.0 mmHg AV Mean Grad:      4.1 mmHg LVOT Vmax:         79.10 cm/s LVOT Vmean:        53.300 cm/s LVOT VTI:          0.131 m LVOT/AV VTI ratio: 0.58  AORTA Ao Root diam: 3.00 cm MITRAL VALVE                TRICUSPID VALVE MV Area (PHT): 4.82 cm     TR Peak grad:   38.2 mmHg MV Decel Time: 157 msec     TR Vmax:        309.00 cm/s MR Peak grad: 61.5 mmHg MR Mean grad: 42.0 mmHg     SHUNTS MR Vmax:      392.00 cm/s   Systemic VTI:  0.13 m MR Vmean:     309.0 cm/s    Systemic Diam: 2.00 cm MV E velocity: 119.33 cm/s Carlyle Dolly MD Electronically signed by Carlyle Dolly MD Signature Date/Time: 04/24/2019/11:07:08 AM    Final         Discharge Exam: Vitals:   04/27/19 0504 04/27/19 0817  BP: 92/67 114/71  Pulse: 73 69  Resp: 16   Temp: 98 F (36.7 C)   SpO2: 96%    Vitals:   04/26/19 2055 04/27/19 0504 04/27/19 0522 04/27/19 0817  BP: 105/81 92/67  114/71  Pulse: (!) 57 73  69  Resp: 16 16    Temp:  98 F (36.7 C)    TempSrc:  Oral    SpO2: 90% 96%     Weight:   96.8 kg   Height:        General: Pt is alert, awake, not in acute distress Cardiovascular: IRRR, S1/S2 +, no rubs, no gallops Respiratory: fine bibasilar crackles. No wheeze Abdominal: Soft, NT, ND, bowel sounds + Extremities: no edema, no cyanosis   The results of significant diagnostics from this hospitalization (including imaging, microbiology, ancillary and laboratory) are listed below for reference.    Significant Diagnostic Studies: DG Chest Port 1 View  Result Date: 04/23/2019 CLINICAL DATA:  Shortness of breath EXAM: PORTABLE CHEST 1 VIEW COMPARISON:  September 08, 2012 FINDINGS: There is a small pleural effusion on the right with equivocal pleural effusion on the left. No edema or airspace opacity evident. There is cardiomegaly with pulmonary venous hypertension. No adenopathy. No bone lesions. IMPRESSION: Cardiomegaly with pulmonary vascular congestion. Small right pleural effusion with equivocal left pleural effusion. Lungs  otherwise clear. No adenopathy. Electronically Signed   By: Lowella Grip III M.D.   On: 04/23/2019 11:13   ECHOCARDIOGRAM COMPLETE  Result Date: 04/24/2019    ECHOCARDIOGRAM REPORT   Patient Name:   [name] [name] Date of Exam: 04/24/2019 Medical Rec #:  [id number]        Height:       72.0 in Accession #:    [id number]       Weight:       223.1 lb Date of Birth:  1955/11/30       BSA:          2.232 m Patient Age:    43 years         BP:           109/81 mmHg Patient Gender: M                HR:           70 bpm. Exam Location:  [facility] Procedure: 2D Echo Indications:    CHF-Acute Systolic 123456 / [id number]  History:        Patient has prior history of Echocardiogram examinations, most                 recent 12/12/2013. CHF; Risk Factors:Hypertension, Dyslipidemia,                 Diabetes and Former Smoker. Chronic renal failure, stage 3a.  Sonographer:    Leavy Cella RDCS (AE) Referring Phys: [place]  1. The is a 1.0 x  0.92 cm apical thrombus that is semi mobile. . Left ventricular ejection fraction, by estimation, is 25 to 30%. The left ventricle has severely decreased function. The left ventricle demonstrates global hypokinesis. There is mild left ventricular hypertrophy. Left ventricular diastolic parameters are indeterminate.  2. Right ventricular systolic function is moderately reduced. The right ventricular size is mildly enlarged. There is moderately elevated pulmonary artery systolic pressure.  3. Left atrial size was severely dilated.  4. The mitral valve is normal in structure. No evidence of mitral valve regurgitation. No evidence of mitral stenosis.  5. The aortic valve is tricuspid. Aortic valve regurgitation is not visualized. No aortic stenosis is present.  6. Indeterminant PASP, inadequate TR jet.  7. The inferior vena cava is normal in size with greater than 50% respiratory variability, suggesting right atrial pressure of 3 mmHg. FINDINGS  Left Ventricle: The is a 1.0 x 0.92 cm apical thrombus that is semi mobile. Left ventricular ejection fraction, by estimation, is 25 to 30%. The left ventricle has severely decreased function. The left ventricle demonstrates global hypokinesis. The left  ventricular internal cavity size was normal in size. There is mild left ventricular hypertrophy. Left ventricular diastolic parameters are indeterminate. Right Ventricle: The right ventricular size is mildly enlarged. No increase in right ventricular wall thickness. Right ventricular systolic function is moderately reduced. There is moderately elevated pulmonary artery systolic pressure. The tricuspid regurgitant velocity is 3.09 m/s, and with an assumed right atrial pressure of 10 mmHg, the estimated right ventricular systolic pressure is 99991111 mmHg. Left Atrium: Left atrial size was severely dilated. Right Atrium: Right atrial size was normal in size. Pericardium: There is no evidence of pericardial effusion. Mitral Valve: The  mitral valve is normal in structure. No evidence of mitral valve regurgitation. No evidence of mitral valve stenosis. Tricuspid Valve: The tricuspid valve is normal in structure. Tricuspid valve regurgitation is mild . No  evidence of tricuspid stenosis. Aortic Valve: The aortic valve is tricuspid. . There is mild thickening and mild calcification of the aortic valve. Aortic valve regurgitation is not visualized. No aortic stenosis is present. Mild aortic valve annular calcification. There is mild thickening of the aortic valve. There is mild calcification of the aortic valve. Aortic valve mean gradient measures 4.1 mmHg. Aortic valve peak gradient measures 8.0 mmHg. Aortic valve area, by VTI measures 1.84 cm. Pulmonic Valve: The pulmonic valve was not well visualized. Pulmonic valve regurgitation is not visualized. No evidence of pulmonic stenosis. Aorta: The aortic root is normal in size and structure. Pulmonary Artery: Indeterminant PASP, inadequate TR jet. Venous: The inferior vena cava is normal in size with greater than 50% respiratory variability, suggesting right atrial pressure of 3 mmHg. IAS/Shunts: No atrial level shunt detected by color flow Doppler.  LEFT VENTRICLE PLAX 2D LVIDd:         5.92 cm  Diastology LVIDs:         4.96 cm  LV e' lateral:   7.37 cm/s LV PW:         1.20 cm  LV E/e' lateral: 16.2 LV IVS:        1.27 cm  LV e' medial:    3.49 cm/s LVOT diam:     2.00 cm  LV E/e' medial:  34.2 LV SV:         41 LV SV Index:   18 LVOT Area:     3.14 cm  RIGHT VENTRICLE RV S prime:     8.06 cm/s TAPSE (M-mode): 1.1 cm LEFT ATRIUM           Index LA diam:      4.50 cm 2.02 cm/m LA Vol (A2C): 90.4 ml 40.50 ml/m LA Vol (A4C): 76.7 ml 34.37 ml/m  AORTIC VALVE AV Area (Vmax):    1.76 cm AV Area (Vmean):   1.77 cm AV Area (VTI):     1.84 cm AV Vmax:           141.17 cm/s AV Vmean:          94.367 cm/s AV VTI:            0.224 m AV Peak Grad:      8.0 mmHg AV Mean Grad:      4.1 mmHg LVOT Vmax:          79.10 cm/s LVOT Vmean:        53.300 cm/s LVOT VTI:          0.131 m LVOT/AV VTI ratio: 0.58  AORTA Ao Root diam: 3.00 cm MITRAL VALVE                TRICUSPID VALVE MV Area (PHT): 4.82 cm     TR Peak grad:   38.2 mmHg MV Decel Time: 157 msec     TR Vmax:        309.00 cm/s MR Peak grad: 61.5 mmHg MR Mean grad: 42.0 mmHg     SHUNTS MR Vmax:      392.00 cm/s   Systemic VTI:  0.13 m MR Vmean:     309.0 cm/s    Systemic Diam: 2.00 cm MV E velocity: 119.33 cm/s Carlyle Dolly MD Electronically signed by Carlyle Dolly MD Signature Date/Time: 04/24/2019/11:07:08 AM    Final      Microbiology: Recent Results (from the past 240 hour(s))  SARS CORONAVIRUS 2 ([place] 6-24 HRS) Nasopharyngeal Nasopharyngeal Swab  Status: None   Collection Time: 04/23/19  1:23 PM   Specimen: Nasopharyngeal Swab  Result Value Ref Range Status   SARS Coronavirus 2 NEGATIVE NEGATIVE Final    Comment: (NOTE) SARS-CoV-2 target nucleic acids are NOT DETECTED. The SARS-CoV-2 RNA is generally detectable in upper and lower respiratory specimens during the acute phase of infection. Negative results do not preclude SARS-CoV-2 infection, do not rule out co-infections with other pathogens, and should not be used as the sole basis for treatment or other patient management decisions. Negative results must be combined with clinical observations, patient history, and epidemiological information. The expected result is Negative. Fact Sheet for Patients: SugarRoll.be Fact Sheet for Healthcare Providers: https://www.woods-mathews.com/ This test is not yet approved or cleared by the [place] FDA and  has been authorized for detection and/or diagnosis of SARS-CoV-2 by FDA under an Emergency Use Authorization (EUA). This EUA will remain  in effect (meaning this test can be used) for the duration of the COVID-19 declaration under Section 56 4(b)(1) of the Act, 21 U.S.C. section  360bbb-3(b)(1), unless the authorization is terminated or revoked sooner. Performed at [facility], [place] [address] [place], [place] [address]      Labs: Basic Metabolic Panel: Recent Labs  Lab 04/23/19 1020 04/23/19 1020 04/23/19 1334 04/24/19 0452 04/24/19 0452 04/25/19 0449 04/25/19 0449 04/26/19 0527 04/27/19 0628  NA 135  --   --  139  --  138  --  139 139  K 4.1   < >  --  3.7   < > 3.6   < > 3.3* 3.8  CL 103  --   --  104  --  103  --  103 102  CO2 21*  --   --  25  --  25  --  25 25  GLUCOSE 224*  --   --  117*  --  127*  --  129* 138*  BUN 16  --   --  17  --  21  --  22 26*  CREATININE 1.45*  --   --  1.40*  --  1.45*  --  1.29* 1.36*  CALCIUM 8.6*  --   --  8.4*  --  8.4*  --  8.6* 9.0  MG  --   --  1.7  --   --  1.7  --  1.9 2.1   < > = values in this interval not displayed.   Liver Function Tests: Recent Labs  Lab 04/23/19 1020  AST 44*  ALT 66*  ALKPHOS 100  BILITOT 0.8  PROT 6.5  ALBUMIN 3.5   No results for input(s): LIPASE, AMYLASE in the last 168 hours. No results for input(s): AMMONIA in the last 168 hours. CBC: Recent Labs  Lab 04/23/19 1020 04/25/19 0449 04/26/19 0527 04/27/19 0628  WBC 7.2 5.5 7.1 6.9  HGB 12.8* 12.3* 13.0 14.3  HCT 39.3 37.4* 39.4 42.6  MCV 110.1* 108.1* 106.5* 105.7*  PLT 191 193 217 229   Cardiac Enzymes: No results for input(s): CKTOTAL, CKMB, CKMBINDEX, TROPONINI in the last 168 hours. BNP: Invalid input(s): POCBNP CBG: Recent Labs  Lab 04/26/19 1106 04/26/19 1600 04/26/19 2210 04/27/19 0750 04/27/19 1219  GLUCAP 184* 109* 143* 153* 164*    Time coordinating discharge:  36 minutes  Signed:  Orson Eva, DO [facility] Pager: [phone] 04/27/2019, 3:29 PM

## 2019-04-27 NOTE — Progress Notes (Signed)
ANTICOAGULATION CONSULT NOTE - Follow-Up Consult  Pharmacy Consult for heparin gtt >> warfarin Indication: apical thrombus  No Known Allergies  Patient Measurements: Height: 6' (182.9 cm) Weight: 96.8 kg (213 lb 6.5 oz) IBW/kg (Calculated) : 77.6 Heparin Dosing Weight: HEPARIN DW (KG): 98.6   Vital Signs: Temp: 98 F (36.7 C) (04/09 0504) Temp Source: Oral (04/09 0504) BP: 114/71 (04/09 0817) Pulse Rate: 69 (04/09 0817)  Labs: Recent Labs    04/25/19 0449 04/25/19 1334 04/26/19 0527 04/26/19 0943 04/26/19 1819 04/27/19 0628  HGB 12.3*  --  13.0  --   --  14.3  HCT 37.4*  --  39.4  --   --  42.6  PLT 193  --  217  --   --  229  LABPROT 14.0  --  15.7*  --   --  19.9*  INR 1.1  --  1.3*  --   --  1.7*  HEPARINUNFRC  --    < >  --  0.71* 0.48 0.51  CREATININE 1.45*  --  1.29*  --   --  1.36*   < > = values in this interval not displayed.    Estimated Creatinine Clearance: 67.9 mL/min (A) (by C-G formula based on SCr of 1.36 mg/dL (H)).   Medical History: Past Medical History:  Diagnosis Date  . Anxiety   . CHF (congestive heart failure) ([place])    a. EF 15% in 2013 with cath showing normal cors b. EF 30-35% by repeat echo in 2015  . Chronic systolic heart failure ([place])   . Diabetes mellitus   . Fluid retention in legs   . Hypertension    Assessment: [name] [name] is a 64 y.o. male who was started on heparin >> warfarin anticoagulation for apical thrombus.    4/9//21 1100 update: Heparin level: 0.51 IU/mL within goal range on heparin at 1500 units/hr INR: 1.7--finally moving up  after total dose of  warfarin 35mg  CBC: remains WNL RN reports no issues with bleeding or infusion site.  Goal of Therapy:  INR 2-3 Heparin level 0.3-0.7 units/ml Monitor platelets by anticoagulation protocol: Yes   Plan:  Give warfarin 10mg  x1 dose tonight Continue heparin infusion at 1500 units/hr  Check anti-Xa level daily while on heparin Monitor INR and CBC  daily Monitor for signs and symptoms of bleeding  Despina Pole, Pharm. D. Clinical Pharmacist 04/27/2019 11:31 AM   04/27/2019 11:23 AM

## 2019-04-27 NOTE — Progress Notes (Signed)
Nsg Discharge Note  Admit Date:  04/23/2019 Discharge date: 04/27/2019   [name] to be D/C'd Home per MD order.  AVS completed.  Copy for chart, and copy for patient signed, and dated. Patient/caregiver able to verbalize understanding.  Discharge Medication: Allergies as of 04/27/2019   No Known Allergies     Medication List    STOP taking these medications   ALPRAZolam 1 MG tablet Commonly known as: XANAX   busPIRone 10 MG tablet Commonly known as: BUSPAR   carvedilol 6.25 MG tablet Commonly known as: COREG   ketoconazole 2 % shampoo Commonly known as: Nizoral   naproxen 500 MG tablet Commonly known as: NAPROSYN   spironolactone 25 MG tablet Commonly known as: ALDACTONE     TAKE these medications   gabapentin 100 MG capsule Commonly known as: NEURONTIN Take 2 capsules (200 mg total) by mouth 3 (three) times daily. What changed:   medication strength  how much to take   losartan 25 MG tablet Commonly known as: COZAAR Take 0.5 tablets (12.5 mg total) by mouth daily.   metFORMIN 500 MG tablet Commonly known as: GLUCOPHAGE Take 500 mg by mouth 2 (two) times daily.   metoprolol succinate 25 MG 24 hr tablet Commonly known as: TOPROL-XL Take 1.5 tablets (37.5 mg total) by mouth daily. Start taking on: April 28, 2019   Oxycodone HCl 10 MG Tabs Take 10 mg by mouth 4 (four) times daily as needed.   simvastatin 40 MG tablet Commonly known as: ZOCOR Take 1 tablet (40 mg total) by mouth at bedtime.   torsemide 20 MG tablet Commonly known as: DEMADEX Take 2 tablets (40 mg total) by mouth daily. Start taking on: April 28, 2019 What changed:   how much to take  how to take this  when to take this  additional instructions   warfarin 2.5 MG tablet Commonly known as: COUMADIN Take 3 tablets (7.5 mg total) by mouth daily at 4 PM. Start taking on: April 28, 2019       Discharge Assessment: Vitals:   04/27/19 0504 04/27/19 0817  BP: 92/67 114/71   Pulse: 73 69  Resp: 16   Temp: 98 F (36.7 C)   SpO2: 96%    Skin clean, dry and intact without evidence of skin break down, no evidence of skin tears noted. IV catheter discontinued intact. Site without signs and symptoms of complications - no redness or edema noted at insertion site, patient denies c/o pain - only slight tenderness at site.  Dressing with slight pressure applied.  D/c Instructions-Education: Discharge instructions given to patient/family with verbalized understanding. D/c education completed with patient/family including follow up instructions, medication list, d/c activities limitations if indicated, with other d/c instructions as indicated by MD - patient able to verbalize understanding, all questions fully answered. Patient instructed to return to ED, call 911, or call MD for any changes in condition.  Patient escorted via [place], and D/C home via private auto.  Zenaida Deed, RN 04/27/2019 4:12 PM

## 2019-04-27 NOTE — Progress Notes (Signed)
Telemetry informed nurse that patient had 11 beat run of vtach but now is in afib. MD made aware. No new orders placed at this time. Will continue to assess.

## 2019-04-27 NOTE — Progress Notes (Addendum)
Progress Note  Patient Name: [name] [name] Date of Encounter: 04/27/2019  Primary Cardiologist: Kate Sable, MD   Subjective   No complaints  Inpatient Medications    Scheduled Meds: . carvedilol  3.125 mg Oral QHS  . furosemide  60 mg Intravenous Q12H  . gabapentin  200 mg Oral TID  . insulin aspart  0-5 Units Subcutaneous QHS  . insulin aspart  0-6 Units Subcutaneous TID WC  . losartan  12.5 mg Oral Daily  . simvastatin  40 mg Oral QHS  . sodium chloride flush  3 mL Intravenous Q12H  . warfarin  10 mg Oral ONCE-1600  . Warfarin - Pharmacist Dosing Inpatient   Does not apply q1600   Continuous Infusions: . sodium chloride    . heparin 1,500 Units/hr (04/26/19 1740)   PRN Meds: sodium chloride, acetaminophen, diphenhydrAMINE, guaiFENesin-dextromethorphan, ondansetron (ZOFRAN) IV, oxyCODONE, sodium chloride flush   Vital Signs    Vitals:   04/26/19 2055 04/27/19 0504 04/27/19 0522 04/27/19 0817  BP: 105/81 92/67  114/71  Pulse: (!) 57 73  69  Resp: 16 16    Temp:  98 F (36.7 C)    TempSrc:  Oral    SpO2: 90% 96%    Weight:   96.8 kg   Height:        Intake/Output Summary (Last 24 hours) at 04/27/2019 0840 Last data filed at 04/26/2019 2224 Gross per 24 hour  Intake 720 ml  Output 1550 ml  Net -830 ml   Last 3 Weights 04/27/2019 04/26/2019 04/25/2019  Weight (lbs) 213 lb 6.5 oz 215 lb 9.8 oz 220 lb 0.3 oz  Weight (kg) 96.8 kg 97.8 kg 99.8 kg      Telemetry    SR, episodes of NSVT - Personally Reviewed  ECG    n/a - Personally Reviewed  Physical Exam   GEN: No acute distress.   Neck: No JVD Cardiac: RRR, no murmurs, rubs, or gallops.  Respiratory: Clear to auscultation bilaterally. GI: Soft, nontender, non-distended  MS: No edema; No deformity. Neuro:  Nonfocal  Psych: Normal affect   Labs    High Sensitivity Troponin:   Recent Labs  Lab 04/23/19 1020 04/23/19 1532  TROPONINIHS 42* 38*      Chemistry Recent Labs  Lab  04/23/19 1020 04/24/19 0452 04/25/19 0449 04/26/19 0527 04/27/19 0628  NA 135   < > 138 139 139  K 4.1   < > 3.6 3.3* 3.8  CL 103   < > 103 103 102  CO2 21*   < > 25 25 25   GLUCOSE 224*   < > 127* 129* 138*  BUN 16   < > 21 22 26*  CREATININE 1.45*   < > 1.45* 1.29* 1.36*  CALCIUM 8.6*   < > 8.4* 8.6* 9.0  PROT 6.5  --   --   --   --   ALBUMIN 3.5  --   --   --   --   AST 44*  --   --   --   --   ALT 66*  --   --   --   --   ALKPHOS 100  --   --   --   --   BILITOT 0.8  --   --   --   --   GFRNONAA 51*   < > 51* 59* 55*  GFRAA 59*   < > 59* >60 >60  ANIONGAP 11   < >  10 11 12    < > = values in this interval not displayed.     Hematology Recent Labs  Lab 04/25/19 0449 04/26/19 0527 04/27/19 0628  WBC 5.5 7.1 6.9  RBC 3.46* 3.70* 4.03*  HGB 12.3* 13.0 14.3  HCT 37.4* 39.4 42.6  MCV 108.1* 106.5* 105.7*  MCH 35.5* 35.1* 35.5*  MCHC 32.9 33.0 33.6  RDW 12.8 12.7 12.6  PLT 193 217 229    BNP Recent Labs  Lab 04/23/19 1020  BNP 1,155.0*     DDimer No results for input(s): DDIMER in the last 168 hours.   Radiology    No results found.  Cardiac Studies    Patient Profile     Ervin Bhardwaj Withersis a 64 y.o.malewith past medical history of chronic combined systolic and diastolic CHF/NICM (EF 0000000 in 2013 with cath showing normal cors,EF30-35% by repeat echo in 2015), HTN, HLD, Type 2 DM and Stage 3 CKDwho is being seen today for the evaluation ofCHFat the request of Dr. Dyann Kief.  Assessment & Plan    1. Acute on chronic systolic HF -History of chronic systolic HF LVEF [id number] by 2015 echo, has not followed up since 2016 in cardiology clinic. Cath in 2013 without significant disease, consistent with a NICM. - 04/2019 echo LVEF 25-30%, mod RV dysfunction  - negative 830 mL Yesterday, neg 7 L since admission. He is on lasix 60mg  IV bid, mild fluctuations in renal function without consistent trend.  By weight down from 226 to 213 lbs this admission.  -  medical therapy with coreg 3.125mg  bid, losartan 12.5mg  daily. SOft bp's limit titration.  - change coreg to toprol 37.5mg  daily for less bp effect, more effect on suppressing his NSVT.  - d/c IV lasix, start torsemide 40mg  daily    2. LV thrombus - noted on echo this admit, apical thrombus - started on coumadin, on hep gtt - would treat 3 months and repeat echo, if resolved could change to DOAC for his afib  3. Afib - new diagnosis this admission - rate controlled on coreg - in setting of afib with CHADS2Vasc score of 3, as well as a newly diagonsed LV thrombus he has been started on coumadin as the preferred agent - repeat echo in 76months, if resolved thrombus then could change to DOAC.  - will not require any bridging at home, just coumadin at discharge   4. CKD 3 - mild fluctuations in renal function this admission, continue to monitor.   5. NSVT - in setting of systolic dysfunction, NICM - keep K at 4, Mg at 2 - change from coreg to toprol    Would follow bp's and HRs after AM dose of toprol (changing him from prior coreg), ambulate patient with nursing staff. If does well would be ok for discharge. We will arrange f/u with coumadin and cardiology clinic.  -will need labs arranged at f/u  For questions or updates, please contact [facility] Please consult [email or web address] for contact info under        Signed, Carlyle Dolly, MD  04/27/2019, 8:40 AM

## 2019-04-30 ENCOUNTER — Ambulatory Visit (INDEPENDENT_AMBULATORY_CARE_PROVIDER_SITE_OTHER): Payer: Medicare PPO | Admitting: *Deleted

## 2019-04-30 ENCOUNTER — Other Ambulatory Visit: Payer: Self-pay

## 2019-04-30 DIAGNOSIS — I4891 Unspecified atrial fibrillation: Secondary | ICD-10-CM | POA: Diagnosis not present

## 2019-04-30 DIAGNOSIS — Z5181 Encounter for therapeutic drug level monitoring: Secondary | ICD-10-CM | POA: Diagnosis not present

## 2019-04-30 DIAGNOSIS — I513 Intracardiac thrombosis, not elsewhere classified: Secondary | ICD-10-CM | POA: Diagnosis not present

## 2019-04-30 LAB — POCT INR: INR: 5 — AB (ref 2.0–3.0)

## 2019-04-30 MED ORDER — LOSARTAN POTASSIUM 25 MG PO TABS: 12.5000 mg | ORAL_TABLET | Freq: Every day | ORAL | 2 refills | Status: DC

## 2019-04-30 NOTE — Patient Instructions (Signed)
D/C home on warfarin 7.5mg  daily Hold warfarin tonight and tomorrow night then decrease dose to 2 tablets daily except 3 tablets on Mondays and Fridays Recheck in 1 week Rx for Losartan sent to [place] because he did not have Rx from hospital discharge.

## 2019-05-07 ENCOUNTER — Other Ambulatory Visit: Payer: Self-pay

## 2019-05-07 ENCOUNTER — Ambulatory Visit (INDEPENDENT_AMBULATORY_CARE_PROVIDER_SITE_OTHER): Payer: Medicare PPO | Admitting: *Deleted

## 2019-05-07 DIAGNOSIS — Z5181 Encounter for therapeutic drug level monitoring: Secondary | ICD-10-CM | POA: Diagnosis not present

## 2019-05-07 DIAGNOSIS — I4891 Unspecified atrial fibrillation: Secondary | ICD-10-CM

## 2019-05-07 DIAGNOSIS — I513 Intracardiac thrombosis, not elsewhere classified: Secondary | ICD-10-CM

## 2019-05-07 LAB — POCT INR: INR: 3.1 — AB (ref 2.0–3.0)

## 2019-05-07 NOTE — Patient Instructions (Signed)
Decrease dose to 2 tablets daily  Recheck in 1 week

## 2019-05-11 ENCOUNTER — Other Ambulatory Visit (HOSPITAL_COMMUNITY)
Admission: RE | Admit: 2019-05-11 | Discharge: 2019-05-11 | Disposition: A | Discharge status: Home / Self Care | Payer: Medicare PPO | Source: Ambulatory Visit | Attending: Student | Admitting: Student

## 2019-05-11 ENCOUNTER — Encounter: Payer: Self-pay | Admitting: Student

## 2019-05-11 ENCOUNTER — Ambulatory Visit: Payer: Medicare PPO | Admitting: Student

## 2019-05-11 VITALS — BP 106/64 | HR 87 | Temp 98.0°F | Ht 71.5 in | Wt 211.0 lb

## 2019-05-11 DIAGNOSIS — I5042 Chronic combined systolic (congestive) and diastolic (congestive) heart failure: Secondary | ICD-10-CM | POA: Diagnosis not present

## 2019-05-11 DIAGNOSIS — Z79899 Other long term (current) drug therapy: Secondary | ICD-10-CM

## 2019-05-11 DIAGNOSIS — E782 Mixed hyperlipidemia: Secondary | ICD-10-CM

## 2019-05-11 DIAGNOSIS — I513 Intracardiac thrombosis, not elsewhere classified: Secondary | ICD-10-CM

## 2019-05-11 DIAGNOSIS — I480 Paroxysmal atrial fibrillation: Secondary | ICD-10-CM

## 2019-05-11 DIAGNOSIS — I420 Dilated cardiomyopathy: Secondary | ICD-10-CM

## 2019-05-11 LAB — BASIC METABOLIC PANEL
Anion gap: 14 (ref 5–15)
BUN: 28 mg/dL — ABNORMAL HIGH (ref 8–23)
CO2: 27 mmol/L (ref 22–32)
Calcium: 7.9 mg/dL — ABNORMAL LOW (ref 8.9–10.3)
Chloride: 94 mmol/L — ABNORMAL LOW (ref 98–111)
Creatinine, Ser: 1.68 mg/dL — ABNORMAL HIGH (ref 0.61–1.24)
GFR calc Af Amer: 50 mL/min — ABNORMAL LOW (ref >60)
GFR calc non Af Amer: 43 mL/min — ABNORMAL LOW (ref >60)
Glucose, Bld: 148 mg/dL — ABNORMAL HIGH (ref 70–99)
Potassium: 3.2 mmol/L — ABNORMAL LOW (ref 3.5–5.1)
Sodium: 135 mmol/L (ref 135–145)

## 2019-05-11 MED ORDER — TORSEMIDE 20 MG PO TABS: 40.0000 mg | ORAL_TABLET | Freq: Every day | ORAL | 3 refills | Status: DC

## 2019-05-11 MED ORDER — LOSARTAN POTASSIUM 25 MG PO TABS: 12.5000 mg | ORAL_TABLET | Freq: Every day | ORAL | 3 refills | Status: DC

## 2019-05-11 MED ORDER — METOPROLOL SUCCINATE ER 25 MG PO TB24: 37.5000 mg | ORAL_TABLET | Freq: Every day | ORAL | 3 refills | Status: DC

## 2019-05-11 NOTE — Patient Instructions (Addendum)
Medication Instructions:  °Your physician recommends that you continue on your current medications as directed. Please refer to the Current Medication list given to you today. ° °*If you need a refill on your cardiac medications before your next appointment, please call your pharmacy* ° ° °Lab Work: °Your physician recommends that you return for lab work in: Today  ° °If you have labs (blood work) drawn today and your tests are completely normal, you will receive your results only by: °• MyChart Message (if you have MyChart) OR °• A paper copy in the mail °If you have any lab test that is abnormal or we need to change your treatment, we will call you to review the results. ° ° °Testing/Procedures: °NONE  ° °Follow-Up: °At [facility], you and your health needs are our priority.  As part of our continuing mission to provide you with exceptional heart care, we have created designated Provider Care Teams.  These Care Teams include your primary Cardiologist (physician) and Advanced Practice Providers (APPs -  Physician Assistants and Nurse Practitioners) who all work together to provide you with the care you need, when you need it. ° °We recommend signing up for the patient portal called "MyChart".  Sign up information is provided on this After Visit Summary.  MyChart is used to connect with patients for Virtual Visits (Telemedicine).  Patients are able to view lab/test results, encounter notes, upcoming appointments, etc.  Non-urgent messages can be sent to your provider as well.   °To learn more about what you can do with MyChart, go to [email or web address].   ° °Your next appointment:   °3 month(s) ° °The format for your next appointment:   °In Person ° °Provider:   °Jonathan Branch, MD ° ° °Other Instructions °Thank you for choosing [facility]! °  ° ° °

## 2019-05-11 NOTE — Progress Notes (Signed)
Cardiology Office Note    Date:  05/12/2019   ID:  [name], DOB 10/05/1955, MRN [id number]  PCP:  [name] Gaskins, MD  Cardiologist: Previously Dr. Bronson [name] in 2016 but he wishes to follow-up with Dr. Harl [name] who followed the patient during his recent admission.   Chief Complaint  Patient presents with  . Hospitalization Follow-up    History of Present Illness:    [name] [name] is a 64 y.o. male with past medical history of chronic combined systolic and diastolic CHF/NICM (EF 0000000 in 2013 with cath showing normal cors, EF 30-35% by repeat echo in 2015), HTN, HLD, Type 2 DM and Stage 3 CKD who presents to the office today for hospital follow-up.  He presented to [facility] on 04/23/2019 for evaluation of worsening dyspnea on exertion, orthopnea and lower extremity edema for the past several weeks. Was found to have an acute CHF exacerbation with BNP elevated to 1155 and was also in new onset atrial fibrillation. He was started on IV Lasix and responded well to this. He diuresed over 7 L during admission and was transitioned to Torsemide 40 mg daily at discharge. He was started on Toprol-XL 37.5mg  daily and Losartan 12.5 mg daily for his cardiomyopathy with BP limiting further titration. Weight had improved to 213 lbs at the time of discharge. Echocardiogram during admission did show an LV thrombus and he was initially started on heparin and transitioned to Coumadin. The plan was to repeat an echocardiogram in 3 months and if his thrombus had resolved, then switch to a DOAC for his atrial fibrillation.  In talking with the patient today, he reports his breathing has significantly improved since hospital discharge. No recurrent dyspnea on exertion, orthopnea, PND or edema. Weight has further declined to 211 lbs on the office scales today. He does report feeling fatigued since admission. No recent chest pain or palpitations.   Reports good compliance with his medication regimen. No  melena, hematochezia or hematuria. INR was 3.1 when checked on 4/19 and is being followed bu the [facility].   Past Medical History:  Diagnosis Date  . Anxiety   . CHF (congestive heart failure) ([place])    a. EF 15% in 2013 with cath showing normal cors b. EF 30-35% by repeat echo in 2015  . Chronic systolic heart failure ([place])   . Diabetes mellitus   . Fluid retention in legs   . Hypertension     Past Surgical History:  Procedure Laterality Date  . COLONOSCOPY  01/04/2012   Procedure: COLONOSCOPY;  Surgeon: Danie Binder, MD;  Location: [facility];  Service: Endoscopy;  Laterality: N/A;  1:30 PM  . HERNIA REPAIR    . LEFT AND RIGHT HEART CATHETERIZATION WITH CORONARY ANGIOGRAM N/A 07/09/2011   Procedure: LEFT AND RIGHT HEART CATHETERIZATION WITH CORONARY ANGIOGRAM;  Surgeon: Peter M Martinique, MD;  Location: [facility] CATH LAB;  Service: Cardiovascular;  Laterality: N/A;    Current Medications: Outpatient Medications Prior to Visit  Medication Sig Dispense Refill  . gabapentin (NEURONTIN) 100 MG capsule Take 2 capsules (200 mg total) by mouth 3 (three) times daily. 180 capsule 1  . metFORMIN (GLUCOPHAGE) 500 MG tablet Take 500 mg by mouth 2 (two) times daily.    . Oxycodone HCl 10 MG TABS Take 10 mg by mouth 4 (four) times daily as needed.    . simvastatin (ZOCOR) 40 MG tablet Take 1 tablet (40 mg total) by mouth at bedtime. 90 tablet 1  .  warfarin (COUMADIN) 2.5 MG tablet Take 3 tablets (7.5 mg total) by mouth daily at 4 PM. 90 tablet 1  . losartan (COZAAR) 25 MG tablet Take 0.5 tablets (12.5 mg total) by mouth daily. 30 tablet 2  . metoprolol succinate (TOPROL-XL) 25 MG 24 hr tablet Take 1.5 tablets (37.5 mg total) by mouth daily. 60 tablet 1  . torsemide (DEMADEX) 20 MG tablet Take 2 tablets (40 mg total) by mouth daily. 60 tablet 1   No facility-administered medications prior to visit.     Allergies:   Patient has no known allergies.   Social History   Socioeconomic History   . Marital status: Married    Spouse name: Not on file  . Number of children: 0  . Years of education: Not on file  . Highest education level: Not on file  Occupational History  . Occupation: disabled  Tobacco Use  . Smoking status: Former Smoker    Packs/day: 0.50    Types: Cigarettes, E-cigarettes    Start date: 10/13/1982    Quit date: 09/19/2011    Years since quitting: 7.6  . Smokeless tobacco: Never Used  . Tobacco comment: electronic cig for 4 months   Substance and Sexual Activity  . Alcohol use: Yes    Comment: weekends  . Drug use: No  . Sexual activity: Not on file  Other Topics Concern  . Not on file  Social History Narrative   Patient is right handed.   Patient seldom drinks caffeine.   Social Determinants of Health   Financial Resource Strain:   . Difficulty of Paying Living Expenses:   Food Insecurity:   . Worried About Charity fundraiser in the Last Year:   . Arboriculturist in the Last Year:   Transportation Needs:   . Film/video editor (Medical):   Marland Kitchen Lack of Transportation (Non-Medical):   Physical Activity:   . Days of Exercise per Week:   . Minutes of Exercise per Session:   Stress:   . Feeling of Stress :   Social Connections:   . Frequency of Communication with Friends and Family:   . Frequency of Social Gatherings with Friends and Family:   . Attends Religious Services:   . Active Member of Clubs or Organizations:   . Attends Archivist Meetings:   Marland Kitchen Marital Status:      Family History:  The patient's family history includes Cancer in his mother; Heart attack in his father; Heart disease in his sister; Heart failure in his father.   Review of Systems:   Please see the history of present illness.     General:  No chills, fever, night sweats or weight changes. Positive for fatigue.  Cardiovascular:  No chest pain, dyspnea on exertion, edema, orthopnea, palpitations, paroxysmal nocturnal dyspnea. Dermatological: No rash,  lesions/masses Respiratory: No cough, dyspnea Urologic: No hematuria, dysuria Abdominal:   No nausea, vomiting, diarrhea, bright red blood per rectum, melena, or hematemesis Neurologic:  No visual changes, wkns, changes in mental status. All other systems reviewed and are otherwise negative except as noted above.   Physical Exam:    VS:  BP 106/64 (BP Location: Left Arm)   Pulse 87   Temp 98 F (36.7 C)   Ht 5' 11.5" (1.816 m)   Wt 211 lb (95.7 kg)   SpO2 96%   BMI 29.02 kg/m    General: Well developed, well nourished,male appearing in no acute distress. Head: Normocephalic, atraumatic, sclera  non-icteric.  Neck: No carotid bruits. JVD not elevated.  Lungs: Respirations regular and unlabored, without wheezes or rales.  Heart: Regular rate and rhythm. No S3 or S4.  No murmur, no rubs, or gallops appreciated. Abdomen: Soft, non-tender, non-distended. No obvious abdominal masses. Msk:  Strength and tone appear normal for age. No obvious joint deformities or effusions. Extremities: No clubbing or cyanosis. No lower extremity edema.  Distal pedal pulses are 2+ bilaterally. Neuro: Alert and oriented X 3. Moves all extremities spontaneously. No focal deficits noted. Psych:  Responds to questions appropriately with a normal affect. Skin: No rashes or lesions noted  Wt Readings from Last 3 Encounters:  05/11/19 211 lb (95.7 kg)  04/27/19 213 lb 6.5 oz (96.8 kg)  08/14/14 221 lb (100.2 kg)     Studies/Labs Reviewed:   EKG:  EKG is not ordered today.    Recent Labs: 04/23/2019: ALT 66; B Natriuretic Peptide 1,155.0; TSH 4.363 04/27/2019: Hemoglobin 14.3; Magnesium 2.1; Platelets 229 05/11/2019: BUN 28; Creatinine, Ser 1.68; Potassium 3.2; Sodium 135   Lipid Panel    Component Value Date/Time   CHOL 132 08/14/2014 1030   TRIG 134 08/14/2014 1030   HDL 50 08/14/2014 1030   CHOLHDL 2.6 08/14/2014 1030   VLDL 27 08/14/2014 1030   LDLCALC 55 08/14/2014 1030    Additional studies/  records that were reviewed today include:   Echocardiogram: 04/2019 IMPRESSIONS    1. The is a 1.0 x 0.92 cm apical thrombus that is semi mobile. . Left  ventricular ejection fraction, by estimation, is 25 to 30%. The left  ventricle has severely decreased function. The left ventricle demonstrates  global hypokinesis. There is mild left  ventricular hypertrophy. Left ventricular diastolic parameters are  indeterminate.  2. Right ventricular systolic function is moderately reduced. The right  ventricular size is mildly enlarged. There is moderately elevated  pulmonary artery systolic pressure.  3. Left atrial size was severely dilated.  4. The mitral valve is normal in structure. No evidence of mitral valve  regurgitation. No evidence of mitral stenosis.  5. The aortic valve is tricuspid. Aortic valve regurgitation is not  visualized. No aortic stenosis is present.  6. Indeterminant PASP, inadequate TR jet.  7. The inferior vena cava is normal in size with greater than 50%  respiratory variability, suggesting right atrial pressure of 3 mmHg.   Assessment:    1. Chronic combined systolic and diastolic heart failure (HCC)   2. Cardiomyopathy, dilated, nonischemic (HCC)   3. Apical mural thrombus   4. Medication management   5. PAF (paroxysmal atrial fibrillation) ([place])   6. Mixed hyperlipidemia      Plan:   In order of problems listed above:  1. Chronic Combined Systolic and Diastolic CHF/ Presumed NICM - EF was previously 15% in 2013 with cath showing normal cors, EF 30-35% by repeat echo in 2015. Repeat echo during admission showed EF now at 25-30%.  - he has done well since hospital discharge and breathing has been at baseline. Appears euvolemic by examination.  - he is currently on Torsemide 40mg  daily. Will recheck BMET today. Continue Toprol-XL 37.5mg  daily and Losartan 12.5mg  daily. BP does not allow for further titration or switching to Entresto at this time.    2. LV Thrombus - echo during admission showed a 1.0 x 0.92 cm apical thrombus that was semi mobile. Will plan for a repeat echocardiogram in 3 months for reassessment. Remains on Coumadin with INR at 3.1 on most recent  check.   3. Paroxysmal Atrial Fibrillation - new diagnosis for the patient during his recent admission and he converted back to NSR. Maintaining NSR by examination today. Continue Toprol-XL 37.5mg  for rate-control. - he denies any evidence of active bleeding. Remains on Coumadin for anticoagulation. Pending repeat echo, can switch to a DOAC if LV thrombus resolved at that time.   4. HLD - followed by PCP. He remains on Simvastatin 40mg  daily.    Medication Adjustments/Labs and Tests Ordered: Current medicines are reviewed at length with the patient today.  Concerns regarding medicines are outlined above.  Medication changes, Labs and Tests ordered today are listed in the Patient Instructions below. Patient Instructions  Medication Instructions:  Your physician recommends that you continue on your current medications as directed. Please refer to the Current Medication list given to you today.  *If you need a refill on your cardiac medications before your next appointment, please call your pharmacy*   Lab Work: Your physician recommends that you return for lab work in: Today   If you have labs (blood work) drawn today and your tests are completely normal, you will receive your results only by: Marland Kitchen MyChart Message (if you have MyChart) OR . A paper copy in the mail If you have any lab test that is abnormal or we need to change your treatment, we will call you to review the results.   Testing/Procedures: NONE    Follow-Up: At [facility], you and your health needs are our priority.  As part of our continuing mission to provide you with exceptional heart care, we have created designated Provider Care Teams.  These Care Teams include your primary Cardiologist  (physician) and Advanced Practice Providers (APPs -  Physician Assistants and Nurse Practitioners) who all work together to provide you with the care you need, when you need it.  We recommend signing up for the patient portal called "MyChart".  Sign up information is provided on this After Visit Summary.  MyChart is used to connect with patients for Virtual Visits (Telemedicine).  Patients are able to view lab/test results, encounter notes, upcoming appointments, etc.  Non-urgent messages can be sent to your provider as well.   To learn more about what you can do with MyChart, go to [email or web address].    Your next appointment:   3 month(s)  The format for your next appointment:   In Person  Provider:   Carlyle Dolly, MD   Other Instructions Thank you for choosing [facility]!       Signed, Erma Heritage, PA-C  05/12/2019 12:00 PM    [place]. [address] [place], [place] [address] Phone: [phone] Fax: [phone]

## 2019-05-12 ENCOUNTER — Encounter: Payer: Self-pay | Admitting: Student

## 2019-05-14 ENCOUNTER — Ambulatory Visit (INDEPENDENT_AMBULATORY_CARE_PROVIDER_SITE_OTHER): Payer: Medicare PPO | Admitting: *Deleted

## 2019-05-14 ENCOUNTER — Other Ambulatory Visit: Payer: Self-pay

## 2019-05-14 ENCOUNTER — Telehealth: Payer: Self-pay

## 2019-05-14 DIAGNOSIS — Z5181 Encounter for therapeutic drug level monitoring: Secondary | ICD-10-CM

## 2019-05-14 DIAGNOSIS — I513 Intracardiac thrombosis, not elsewhere classified: Secondary | ICD-10-CM | POA: Diagnosis not present

## 2019-05-14 DIAGNOSIS — Z79899 Other long term (current) drug therapy: Secondary | ICD-10-CM

## 2019-05-14 LAB — POCT INR: INR: 3.4 — AB (ref 2.0–3.0)

## 2019-05-14 MED ORDER — TORSEMIDE 20 MG PO TABS: 20.0000 mg | ORAL_TABLET | Freq: Every day | ORAL | 3 refills | Status: DC

## 2019-05-14 MED ORDER — POTASSIUM CHLORIDE CRYS ER 20 MEQ PO TBCR: EXTENDED_RELEASE_TABLET | ORAL | 0 refills | Status: DC

## 2019-05-14 NOTE — Telephone Encounter (Signed)
Spoke with patient, he will make med cg=hanges as directed by B. Strader, PA-C, I will mail lab slip

## 2019-05-14 NOTE — Telephone Encounter (Signed)
-----   Message from Erma Heritage, [place] sent at 05/12/2019  8:52 AM EDT ----- Please let the patient know his kidney function has worsened since hospital discharge. Given that volume status was stable by examination, would recommend reducing his Torsemide to 20mg  daily. He can take an extra tablet if needed for weight gain greater than 2 lbs overnight or 5 lbs in one week. K+ was also low. Please have him take K-dur 20 mEq for 4 days. I suspect his K+ will improve with dose reduction of Torsemide as well. He will need a repeat BMET in 2 weeks for reassessment.

## 2019-05-14 NOTE — Patient Instructions (Signed)
Hold warfarin tonight then decrease dose to 2 tablets daily except 1 1/2 tablets on Mondays Wednesdays and Fridays Recheck in 2 weeks

## 2019-05-24 ENCOUNTER — Other Ambulatory Visit (HOSPITAL_COMMUNITY)
Admission: RE | Admit: 2019-05-24 | Discharge: 2019-05-24 | Disposition: A | Discharge status: Home / Self Care | Payer: Medicare PPO | Source: Ambulatory Visit | Attending: Student | Admitting: Student

## 2019-05-24 DIAGNOSIS — Z79899 Other long term (current) drug therapy: Secondary | ICD-10-CM | POA: Insufficient documentation

## 2019-05-24 LAB — BASIC METABOLIC PANEL
Anion gap: 12 (ref 5–15)
BUN: 16 mg/dL (ref 8–23)
CO2: 28 mmol/L (ref 22–32)
Calcium: 7.5 mg/dL — ABNORMAL LOW (ref 8.9–10.3)
Chloride: 99 mmol/L (ref 98–111)
Creatinine, Ser: 1.44 mg/dL — ABNORMAL HIGH (ref 0.61–1.24)
GFR calc Af Amer: 60 mL/min — ABNORMAL LOW (ref >60)
GFR calc non Af Amer: 52 mL/min — ABNORMAL LOW (ref >60)
Glucose, Bld: 123 mg/dL — ABNORMAL HIGH (ref 70–99)
Potassium: 3.1 mmol/L — ABNORMAL LOW (ref 3.5–5.1)
Sodium: 139 mmol/L (ref 135–145)

## 2019-05-25 ENCOUNTER — Telehealth: Payer: Self-pay

## 2019-05-25 DIAGNOSIS — Z79899 Other long term (current) drug therapy: Secondary | ICD-10-CM

## 2019-05-25 MED ORDER — POTASSIUM CHLORIDE CRYS ER 20 MEQ PO TBCR
EXTENDED_RELEASE_TABLET | ORAL | 3 refills | Status: DC
Start: 2019-05-25 — End: 2019-06-15

## 2019-05-25 NOTE — Telephone Encounter (Signed)
I spoke with patient.He will take potassium 20 meq daily and repeat bmet in 3-4 weeks.I mailed lab slip to him.

## 2019-05-25 NOTE — Telephone Encounter (Signed)
-----   Message from Erma Heritage, [place] sent at 05/25/2019 12:20 PM EDT ----- Please let the patient know his renal function has improved with creatinine going from 1.68 to 1.44. His potassium remains low so please have him start K-dur 20 mEq daily (previously on this for only 4 days but now needs daily dosing given consistently low potassium). Would recommend repeat labs with [place] or his PCP in 3-4 weeks.

## 2019-05-28 ENCOUNTER — Other Ambulatory Visit: Payer: Self-pay

## 2019-05-28 ENCOUNTER — Ambulatory Visit (INDEPENDENT_AMBULATORY_CARE_PROVIDER_SITE_OTHER): Payer: Medicare PPO | Admitting: *Deleted

## 2019-05-28 DIAGNOSIS — I513 Intracardiac thrombosis, not elsewhere classified: Secondary | ICD-10-CM | POA: Diagnosis not present

## 2019-05-28 DIAGNOSIS — I4891 Unspecified atrial fibrillation: Secondary | ICD-10-CM

## 2019-05-28 DIAGNOSIS — Z5181 Encounter for therapeutic drug level monitoring: Secondary | ICD-10-CM | POA: Diagnosis not present

## 2019-05-28 LAB — POCT INR: INR: 2.2 (ref 2.0–3.0)

## 2019-05-28 NOTE — Patient Instructions (Signed)
Continue warfarin 2 tablets daily except 1 1/2 tablets on Mondays Wednesdays and Fridays Recheck in 3 weeks

## 2019-06-15 ENCOUNTER — Other Ambulatory Visit: Payer: Self-pay

## 2019-06-15 ENCOUNTER — Telehealth: Payer: Self-pay | Admitting: *Deleted

## 2019-06-15 ENCOUNTER — Other Ambulatory Visit (HOSPITAL_COMMUNITY)
Admission: RE | Admit: 2019-06-15 | Discharge: 2019-06-15 | Disposition: A | Discharge status: Home / Self Care | Payer: Medicare PPO | Source: Ambulatory Visit | Attending: Student | Admitting: Student

## 2019-06-15 DIAGNOSIS — Z79899 Other long term (current) drug therapy: Secondary | ICD-10-CM | POA: Insufficient documentation

## 2019-06-15 LAB — BASIC METABOLIC PANEL
Anion gap: 17 — ABNORMAL HIGH (ref 5–15)
BUN: 24 mg/dL — ABNORMAL HIGH (ref 8–23)
CO2: 23 mmol/L (ref 22–32)
Calcium: 7.5 mg/dL — ABNORMAL LOW (ref 8.9–10.3)
Chloride: 99 mmol/L (ref 98–111)
Creatinine, Ser: 1.65 mg/dL — ABNORMAL HIGH (ref 0.61–1.24)
GFR calc Af Amer: 51 mL/min — ABNORMAL LOW (ref >60)
GFR calc non Af Amer: 44 mL/min — ABNORMAL LOW (ref >60)
Glucose, Bld: 111 mg/dL — ABNORMAL HIGH (ref 70–99)
Potassium: 3.2 mmol/L — ABNORMAL LOW (ref 3.5–5.1)
Sodium: 139 mmol/L (ref 135–145)

## 2019-06-15 MED ORDER — POTASSIUM CHLORIDE CRYS ER 20 MEQ PO TBCR
40.0000 meq | EXTENDED_RELEASE_TABLET | Freq: Every day | ORAL | 3 refills | Status: DC
Start: 2019-06-15 — End: 2019-07-31

## 2019-06-15 NOTE — Telephone Encounter (Signed)
-----   Message from Erma Heritage, [place] sent at 06/15/2019 10:58 AM EDT ----- Please let the patient know his kidney function has slightly worsened since his most recent labs and potassium still remains low. Has he had to take any extra Torsemide over the past 2 weeks?  Has weight remained stable? If he has not required extra Torsemide and weight is stable, would recommend continuing Torsemide at his current dosing and increasing K-dur to 40 mEq daily. If weight has increased, please forward back to me. Repeat BMET again in 3-4 weeks.

## 2019-06-15 NOTE — Telephone Encounter (Signed)
    Thank you for the update. Would therefore continue Torsemide at his current dosing and increase K-dur as previously recommended with plans for a repeat BMET in 3-4 weeks.   Signed, Erma Heritage, PA-C 06/15/2019, 5:16 PM Pager: [phone]

## 2019-06-15 NOTE — Telephone Encounter (Signed)
Spoke with pt who states he has not taken any extra Torsemide, and reports his weights have remained the same.

## 2019-06-20 ENCOUNTER — Telehealth: Payer: Self-pay | Admitting: Cardiology

## 2019-06-20 NOTE — Telephone Encounter (Signed)
Pt's weight today is 215 lbs.took extra torsemide 20 mg at 4 pm.He will call tomorrow with update.I ordered a free bp monitor for him

## 2019-06-20 NOTE — Telephone Encounter (Signed)
per pt phone call-- requesting apt to be seen- no energy, hard to move and he's swelling, scheduled to see Dr. Harl Bowie in the [place] office on Monday wanted to speak w/ someone before then  [phone]

## 2019-06-21 ENCOUNTER — Other Ambulatory Visit: Payer: Self-pay

## 2019-06-21 ENCOUNTER — Telehealth: Payer: Self-pay | Admitting: Licensed Clinical Social Worker

## 2019-06-21 ENCOUNTER — Ambulatory Visit (INDEPENDENT_AMBULATORY_CARE_PROVIDER_SITE_OTHER): Payer: Medicare PPO | Admitting: *Deleted

## 2019-06-21 DIAGNOSIS — I4891 Unspecified atrial fibrillation: Secondary | ICD-10-CM | POA: Diagnosis not present

## 2019-06-21 DIAGNOSIS — Z5181 Encounter for therapeutic drug level monitoring: Secondary | ICD-10-CM | POA: Diagnosis not present

## 2019-06-21 DIAGNOSIS — I513 Intracardiac thrombosis, not elsewhere classified: Secondary | ICD-10-CM | POA: Diagnosis not present

## 2019-06-21 LAB — POCT INR: INR: 3.3 — AB (ref 2.0–3.0)

## 2019-06-21 NOTE — Telephone Encounter (Signed)
CSW referred to assist patient with obtaining a BP cuff. CSW contacted patient to inform cuff will be delivered to home. Patient grateful for support and assistance. CSW available as needed. Jackie Monti Jilek, LCSW, CCSW-MCS [phone]  

## 2019-06-21 NOTE — Patient Instructions (Signed)
Hold warfarin tonight then resume 2 tablets daily except 1 1/2 tablets on Mondays Wednesdays and Fridays Recheck in 3 weeks

## 2019-06-25 ENCOUNTER — Encounter: Payer: Self-pay | Admitting: Cardiology

## 2019-06-25 ENCOUNTER — Ambulatory Visit (INDEPENDENT_AMBULATORY_CARE_PROVIDER_SITE_OTHER): Payer: Medicare PPO | Admitting: Cardiology

## 2019-06-25 ENCOUNTER — Other Ambulatory Visit: Payer: Self-pay

## 2019-06-25 VITALS — BP 92/60 | HR 121 | Ht 72.0 in | Wt 219.2 lb

## 2019-06-25 DIAGNOSIS — I4891 Unspecified atrial fibrillation: Secondary | ICD-10-CM | POA: Diagnosis not present

## 2019-06-25 DIAGNOSIS — I5023 Acute on chronic systolic (congestive) heart failure: Secondary | ICD-10-CM

## 2019-06-25 DIAGNOSIS — E876 Hypokalemia: Secondary | ICD-10-CM

## 2019-06-25 MED ORDER — TORSEMIDE 20 MG PO TABS: 40.0000 mg | ORAL_TABLET | Freq: Every day | ORAL | 1 refills | Status: DC

## 2019-06-25 MED ORDER — AMIODARONE HCL 200 MG PO TABS
ORAL_TABLET | ORAL | 1 refills | Status: DC
Start: 2019-06-25 — End: 2019-09-17

## 2019-06-25 NOTE — Progress Notes (Signed)
Clinical Summary Mr. Deman is a 64 y.o.male  1. Chronic systolic HF -History of chronic systolic HF LVEF 83-50% by 2015 echo, has not followed up since 2016 in cardiology clinic. Cath in 2013 without significant disease, consistent with a NICM. - 04/2019 echo LVEF 25-30%, mod RV dysfunction    - admitted 04/2019 with fluid overload  - has had some recent LE edema. Weight up 8 lbs since 04/2019 - weight uptrend since diuretic decreased - compliant with meds - taking torsemide 93m daily.    2. LV thrombus - noted on echo during 04/2019 admission - started on coumadin - would treat 3 months and repeat echo, if resolved could change to DOAC for his afib  3. Afib - new diagnosis 04/2019 admission  - has had some recent palpitations, often with laying down.     4. CKD 3 - mild fluctuations in renal function this admission, continue to monitor.   - 06/15/19 Cr 1.65, up from 1.44   5. NSVT - in setting of systolic dysfunction, NICM - keep K at 4, Mg at 2 - change from coreg to toprol   6. Fatigue Some increased fatigue, primarily with exertion. -   7. Hypokalemia - potassium increased to 443m daily.     Past Medical History:  Diagnosis Date  . Anxiety   . CH[place] (congestive heart failure) ([facility]   a. EF 15% in 2013 with cath showing normal cors b. EF 30-35% by repeat echo in 2015  . Chronic systolic heart failure ([facility]  . Diabetes mellitus   . Fluid retention in legs   . Hypertension      No Known Allergies   Current Outpatient Medications  Medication Sig Dispense Refill  . gabapentin (NEURONTIN) 100 MG capsule Take 2 capsules (200 mg total) by mouth 3 (three) times daily. 180 capsule 1  . losartan (COZAAR) 25 MG tablet Take 0.5 tablets (12.5 mg total) by mouth daily. 45 tablet 3  . metFORMIN (GLUCOPHAGE) 500 MG tablet Take 500 mg by mouth 2 (two) times daily.    . metoprolol succinate (TOPROL-XL) 25 MG 24 hr tablet Take 1.5 tablets (37.5 mg  total) by mouth daily. 135 tablet 3  . Oxycodone HCl 10 MG TABS Take 10 mg by mouth 4 (four) times daily as needed.    . potassium chloride SA (KLOR-CON) 20 MEQ tablet Take 2 tablets (40 mEq total) by mouth daily. 180 tablet 3  . simvastatin (ZOCOR) 40 MG tablet Take 1 tablet (40 mg total) by mouth at bedtime. 90 tablet 1  . torsemide (DEMADEX) 20 MG tablet Take 1 tablet (20 mg total) by mouth daily. May take additional 20 mg for wt gain over 2 lbs in 24 hrs or 5 lbs in 1 week 180 tablet 3  . warfarin (COUMADIN) 2.5 MG tablet Take 3 tablets (7.5 mg total) by mouth daily at 4 PM. 90 tablet 1   No current facility-administered medications for this visit.     Past Surgical History:  Procedure Laterality Date  . COLONOSCOPY  01/04/2012   Procedure: COLONOSCOPY;  Surgeon: SaDanie BinderMD;  [facility];  Service: Endoscopy;  Laterality: N/A;  1:30 PM  . HERNIA REPAIR    . LEFT AND RIGHT HEART CATHETERIZATION WITH CORONARY ANGIOGRAM N/A 07/09/2011   Procedure: LEFT AND RIGHT HEART CATHETERIZATION WITH CORONARY ANGIOGRAM;  Surgeon: Peter M JoMartiniqueMD;  [facility];  Service: Cardiovascular;  Laterality: N/A;  No Known Allergies    Family History  Problem Relation Age of Onset  . Heart attack Father   . Heart failure Father   . Cancer Mother        Multiple myeloma  . Heart disease Sister      Social History Mr. Rodman reports that he quit smoking about 7 years ago. His smoking use included cigarettes and e-cigarettes. He started smoking about 36 years ago. He smoked 0.50 packs per day. He has never used smokeless tobacco. Mr. Skog reports current alcohol use.   Review of Systems CONSTITUTIONAL: No weight loss, fever, chills, weakness or fatigue.  HEENT: Eyes: No visual loss, blurred vision, double vision or yellow sclerae.No hearing loss, sneezing, congestion, runny nose or sore throat.  SKIN: No rash or itching.  CARDIOVASCULAR: per  hpi RESPIRATORY: No shortness of breath, cough or sputum.  GASTROINTESTINAL: No anorexia, nausea, vomiting or diarrhea. No abdominal pain or blood.  GENITOURINARY: No burning on urination, no polyuria NEUROLOGICAL: No headache, dizziness, syncope, paralysis, ataxia, numbness or tingling in the extremities. No change in bowel or bladder control.  MUSCULOSKELETAL: No muscle, back pain, joint pain or stiffness.  LYMPHATICS: No enlarged nodes. No history of splenectomy.  PSYCHIATRIC: No history of depression or anxiety.  ENDOCRINOLOGIC: No reports of sweating, cold or heat intolerance. No polyuria or polydipsia.  Marland Kitchen   Physical Examination Today's Vitals   06/25/19 1338  BP: 92/60  Pulse: (!) 121  SpO2: 93%  Weight: 219 lb 3.2 oz (99.4 kg)  Height: 6' (1.829 m)   Body mass index is 29.73 kg/m.  Gen: resting comfortably, no acute distress HEENT: no scleral icterus, pupils equal round and reactive, no palptable cervical adenopathy,  CV: irreg, no m/r/g. +JVD Resp: Clear to auscultation bilaterally GI: abdomen is soft, non-tender, non-distended, normal bowel sounds, no hepatosplenomegaly MSK: extremities are warm, 1+ bilateral LE edema  Skin: warm, no rash Neuro:  no focal deficits Psych: appropriate affect     Assessment and Plan  1. Acute on chronic systolic HF - up 8 lbs since last visit, volume overloaded by exam - I think uncontrolled afib is the exacerbating factor - increase torsemide to 65m daily, has labs pending for Monday   2. Afib - rates 120s by dynamap initally, after sitting and getting EKG in the 90s afib - I suspect given his palpitations and high HRs afib is not well controlled, likely exacerbating his HF - do not have room with bp to tirate beta blocker. With labiel renal function would be hesitant for digoxin. HF and renal dysfunction limit antiarrhytmic options - start amio 4077mbid x 1 week, then 20070mid x 2 weeks, then 200m94mily   3.  Hypokalemia - upcoming repeat labs  F/u 1 week, repeat EKG at that time. Reassess volume status and labs    JonaArnoldo LenisD.

## 2019-06-25 NOTE — Patient Instructions (Signed)
Your physician recommends that you schedule a follow-up appointment in: June 14TH AT 840 AM   Your physician has recommended you make the following change in your medication:   START AMIODARONE 400 MG TWICE DAILY FOR 1 WEEK   THEN AMIODARONE 200 MG TWICE DAILY FOR 2 WEEKS   THEN AMIODARONE 200 MG DAILY   INCREASE TORSEMIDE 40 MG DAILY  PLEASE HAVE LABS DONE ON Monday   Thank you for choosing [facility]!!

## 2019-06-29 ENCOUNTER — Other Ambulatory Visit (HOSPITAL_COMMUNITY)
Admission: RE | Admit: 2019-06-29 | Discharge: 2019-06-29 | Disposition: A | Discharge status: Home / Self Care | Payer: Medicare PPO | Source: Ambulatory Visit | Attending: Student | Admitting: Student

## 2019-06-29 ENCOUNTER — Telehealth: Payer: Self-pay | Admitting: *Deleted

## 2019-06-29 DIAGNOSIS — I5023 Acute on chronic systolic (congestive) heart failure: Secondary | ICD-10-CM | POA: Diagnosis not present

## 2019-06-29 DIAGNOSIS — Z79899 Other long term (current) drug therapy: Secondary | ICD-10-CM | POA: Insufficient documentation

## 2019-06-29 DIAGNOSIS — I130 Hypertensive heart and chronic kidney disease with heart failure and stage 1 through stage 4 chronic kidney disease, or unspecified chronic kidney disease: Secondary | ICD-10-CM | POA: Diagnosis not present

## 2019-06-29 DIAGNOSIS — N1831 Chronic kidney disease, stage 3a: Secondary | ICD-10-CM

## 2019-06-29 LAB — BASIC METABOLIC PANEL
Anion gap: 16 — ABNORMAL HIGH (ref 5–15)
BUN: 76 mg/dL — ABNORMAL HIGH (ref 8–23)
CO2: 23 mmol/L (ref 22–32)
Calcium: 7.3 mg/dL — ABNORMAL LOW (ref 8.9–10.3)
Chloride: 97 mmol/L — ABNORMAL LOW (ref 98–111)
Creatinine, Ser: 3.77 mg/dL — ABNORMAL HIGH (ref 0.61–1.24)
GFR calc Af Amer: 19 mL/min — ABNORMAL LOW (ref >60)
GFR calc non Af Amer: 16 mL/min — ABNORMAL LOW (ref >60)
Glucose, Bld: 151 mg/dL — ABNORMAL HIGH (ref 70–99)
Potassium: 3.7 mmol/L (ref 3.5–5.1)
Sodium: 136 mmol/L (ref 135–145)

## 2019-06-29 NOTE — Telephone Encounter (Signed)
Pt's wife Deshun Sedivy notified of test results.

## 2019-06-29 NOTE — Telephone Encounter (Signed)
-----   Message from Erma Heritage, [place] sent at 06/29/2019  4:59 PM EDT ----- Please let the patient know his renal function has acutely worsened with creatinine going from 1.65 in 05/2018 to 3.77 when checked earlier today. It looks like he was just evaluated by Dr. Harl Bowie on 06/25/2019 and Torsemide was titrated to 40 mg daily. Did his weight significantly change with the increased dose? Given his acute rise in creatinine, I would recommend he hold Losartan and Torsemide for now with repeat BMET on Monday or Tuesday. I cannot see where he is followed by Nephrology. I would recommend referral to [facility] here in [place] to establish care as his renal function has been extremely variable anytime we adjust his diuretic therapy.

## 2019-07-02 ENCOUNTER — Encounter (HOSPITAL_COMMUNITY): Payer: Self-pay | Admitting: *Deleted

## 2019-07-02 ENCOUNTER — Encounter: Payer: Self-pay | Admitting: Cardiology

## 2019-07-02 ENCOUNTER — Ambulatory Visit: Payer: Medicare PPO | Admitting: Cardiology

## 2019-07-02 ENCOUNTER — Emergency Department (HOSPITAL_COMMUNITY): Payer: Medicare PPO

## 2019-07-02 ENCOUNTER — Inpatient Hospital Stay (HOSPITAL_COMMUNITY)
Admission: AD | Admit: 2019-07-02 | Discharge: 2019-07-07 | DRG: 291 | Disposition: A | Discharge status: Home / Self Care | Payer: Medicare PPO | Source: Ambulatory Visit | Attending: Internal Medicine | Admitting: Internal Medicine

## 2019-07-02 ENCOUNTER — Other Ambulatory Visit: Payer: Self-pay

## 2019-07-02 VITALS — BP 90/60 | HR 68 | Ht 72.0 in | Wt 224.0 lb

## 2019-07-02 DIAGNOSIS — I5043 Acute on chronic combined systolic (congestive) and diastolic (congestive) heart failure: Secondary | ICD-10-CM | POA: Diagnosis present

## 2019-07-02 DIAGNOSIS — I4891 Unspecified atrial fibrillation: Secondary | ICD-10-CM

## 2019-07-02 DIAGNOSIS — E876 Hypokalemia: Secondary | ICD-10-CM | POA: Diagnosis present

## 2019-07-02 DIAGNOSIS — I959 Hypotension, unspecified: Secondary | ICD-10-CM | POA: Diagnosis present

## 2019-07-02 DIAGNOSIS — Z79899 Other long term (current) drug therapy: Secondary | ICD-10-CM

## 2019-07-02 DIAGNOSIS — J9601 Acute respiratory failure with hypoxia: Secondary | ICD-10-CM | POA: Diagnosis present

## 2019-07-02 DIAGNOSIS — I472 Ventricular tachycardia: Secondary | ICD-10-CM | POA: Diagnosis not present

## 2019-07-02 DIAGNOSIS — I130 Hypertensive heart and chronic kidney disease with heart failure and stage 1 through stage 4 chronic kidney disease, or unspecified chronic kidney disease: Secondary | ICD-10-CM | POA: Diagnosis present

## 2019-07-02 DIAGNOSIS — Z7901 Long term (current) use of anticoagulants: Secondary | ICD-10-CM

## 2019-07-02 DIAGNOSIS — E1140 Type 2 diabetes mellitus with diabetic neuropathy, unspecified: Secondary | ICD-10-CM | POA: Diagnosis present

## 2019-07-02 DIAGNOSIS — N1831 Chronic kidney disease, stage 3a: Secondary | ICD-10-CM | POA: Diagnosis present

## 2019-07-02 DIAGNOSIS — I509 Heart failure, unspecified: Secondary | ICD-10-CM

## 2019-07-02 DIAGNOSIS — I5023 Acute on chronic systolic (congestive) heart failure: Secondary | ICD-10-CM | POA: Diagnosis present

## 2019-07-02 DIAGNOSIS — F17210 Nicotine dependence, cigarettes, uncomplicated: Secondary | ICD-10-CM | POA: Diagnosis present

## 2019-07-02 DIAGNOSIS — Z20822 Contact with and (suspected) exposure to covid-19: Secondary | ICD-10-CM | POA: Diagnosis present

## 2019-07-02 DIAGNOSIS — E785 Hyperlipidemia, unspecified: Secondary | ICD-10-CM | POA: Diagnosis present

## 2019-07-02 DIAGNOSIS — I513 Intracardiac thrombosis, not elsewhere classified: Secondary | ICD-10-CM | POA: Diagnosis present

## 2019-07-02 DIAGNOSIS — K761 Chronic passive congestion of liver: Secondary | ICD-10-CM | POA: Diagnosis present

## 2019-07-02 DIAGNOSIS — N179 Acute kidney failure, unspecified: Secondary | ICD-10-CM | POA: Diagnosis present

## 2019-07-02 DIAGNOSIS — Z7984 Long term (current) use of oral hypoglycemic drugs: Secondary | ICD-10-CM | POA: Diagnosis not present

## 2019-07-02 DIAGNOSIS — I420 Dilated cardiomyopathy: Secondary | ICD-10-CM | POA: Diagnosis present

## 2019-07-02 DIAGNOSIS — E1121 Type 2 diabetes mellitus with diabetic nephropathy: Secondary | ICD-10-CM | POA: Diagnosis not present

## 2019-07-02 DIAGNOSIS — E1122 Type 2 diabetes mellitus with diabetic chronic kidney disease: Secondary | ICD-10-CM | POA: Diagnosis present

## 2019-07-02 DIAGNOSIS — I4819 Other persistent atrial fibrillation: Secondary | ICD-10-CM | POA: Diagnosis present

## 2019-07-02 LAB — COMPREHENSIVE METABOLIC PANEL
ALT: 617 U/L — ABNORMAL HIGH (ref 0–44)
AST: 117 U/L — ABNORMAL HIGH (ref 15–41)
Albumin: 3.5 g/dL (ref 3.5–5.0)
Alkaline Phosphatase: 100 U/L (ref 38–126)
Anion gap: 13 (ref 5–15)
BUN: 41 mg/dL — ABNORMAL HIGH (ref 8–23)
CO2: 25 mmol/L (ref 22–32)
Calcium: 7.4 mg/dL — ABNORMAL LOW (ref 8.9–10.3)
Chloride: 101 mmol/L (ref 98–111)
Creatinine, Ser: 1.73 mg/dL — ABNORMAL HIGH (ref 0.61–1.24)
GFR calc Af Amer: 48 mL/min — ABNORMAL LOW (ref >60)
GFR calc non Af Amer: 41 mL/min — ABNORMAL LOW (ref >60)
Glucose, Bld: 168 mg/dL — ABNORMAL HIGH (ref 70–99)
Potassium: 4 mmol/L (ref 3.5–5.1)
Sodium: 139 mmol/L (ref 135–145)
Total Bilirubin: 0.4 mg/dL (ref 0.3–1.2)
Total Protein: 6.7 g/dL (ref 6.5–8.1)

## 2019-07-02 LAB — PROTIME-INR
INR: 5.5 (ref 0.8–1.2)
Prothrombin Time: 48.2 seconds — ABNORMAL HIGH (ref 11.4–15.2)

## 2019-07-02 LAB — GLUCOSE, CAPILLARY
Glucose-Capillary: 103 mg/dL — ABNORMAL HIGH (ref 70–99)
Glucose-Capillary: 142 mg/dL — ABNORMAL HIGH (ref 70–99)

## 2019-07-02 LAB — CBC WITH DIFFERENTIAL/PLATELET
Abs Immature Granulocytes: 0.02 10*3/uL (ref 0.00–0.07)
Basophils Absolute: 0 10*3/uL (ref 0.0–0.1)
Basophils Relative: 0 %
Eosinophils Absolute: 0.1 10*3/uL (ref 0.0–0.5)
Eosinophils Relative: 1 %
HCT: 38 % — ABNORMAL LOW (ref 39.0–52.0)
Hemoglobin: 12.4 g/dL — ABNORMAL LOW (ref 13.0–17.0)
Immature Granulocytes: 0 %
Lymphocytes Relative: 24 %
Lymphs Abs: 1.5 10*3/uL (ref 0.7–4.0)
MCH: 33.7 pg (ref 26.0–34.0)
MCHC: 32.6 g/dL (ref 30.0–36.0)
MCV: 103.3 fL — ABNORMAL HIGH (ref 80.0–100.0)
Monocytes Absolute: 0.5 10*3/uL (ref 0.1–1.0)
Monocytes Relative: 8 %
Neutro Abs: 4.4 10*3/uL (ref 1.7–7.7)
Neutrophils Relative %: 67 %
Platelets: 198 10*3/uL (ref 150–400)
RBC: 3.68 MIL/uL — ABNORMAL LOW (ref 4.22–5.81)
RDW: 14.7 % (ref 11.5–15.5)
WBC: 6.5 10*3/uL (ref 4.0–10.5)
nRBC: 0.8 % — ABNORMAL HIGH (ref 0.0–0.2)

## 2019-07-02 LAB — BRAIN NATRIURETIC PEPTIDE: B Natriuretic Peptide: 2735 pg/mL — ABNORMAL HIGH (ref 0.0–100.0)

## 2019-07-02 LAB — HEMOGLOBIN A1C
Hgb A1c MFr Bld: 7.9 % — ABNORMAL HIGH (ref 4.8–5.6)
Mean Plasma Glucose: 180.03 mg/dL

## 2019-07-02 LAB — SARS CORONAVIRUS 2 BY RT PCR (HOSPITAL ORDER, PERFORMED IN ~~LOC~~ HOSPITAL LAB): SARS Coronavirus 2: NEGATIVE

## 2019-07-02 MED ORDER — FUROSEMIDE 10 MG/ML IJ SOLN
40.0000 mg | Freq: Once | INTRAMUSCULAR | Status: DC
  Filled 2019-07-02: qty 4

## 2019-07-02 MED ORDER — ONDANSETRON HCL 4 MG/2ML IJ SOLN: 4.0000 mg | Freq: Four times a day (QID) | INTRAMUSCULAR | Status: DC | PRN

## 2019-07-02 MED ORDER — FUROSEMIDE 10 MG/ML IJ SOLN
80.0000 mg | Freq: Two times a day (BID) | INTRAMUSCULAR | Status: DC
  Administered 2019-07-02 – 2019-07-06 (×8): 80 mg via INTRAVENOUS
  Filled 2019-07-02 (×7): qty 8

## 2019-07-02 MED ORDER — INSULIN ASPART 100 UNIT/ML ~~LOC~~ SOLN
0.0000 [IU] | Freq: Three times a day (TID) | SUBCUTANEOUS | Status: DC
  Administered 2019-07-03: 8 [IU] via SUBCUTANEOUS
  Administered 2019-07-05: 2 [IU] via SUBCUTANEOUS
  Administered 2019-07-05: 3 [IU] via SUBCUTANEOUS
  Administered 2019-07-06: 5 [IU] via SUBCUTANEOUS
  Administered 2019-07-06 – 2019-07-07 (×2): 2 [IU] via SUBCUTANEOUS

## 2019-07-02 MED ORDER — POTASSIUM CHLORIDE CRYS ER 20 MEQ PO TBCR
40.0000 meq | EXTENDED_RELEASE_TABLET | Freq: Every day | ORAL | Status: DC
  Administered 2019-07-02 – 2019-07-04 (×3): 40 meq via ORAL
  Filled 2019-07-02 (×3): qty 2

## 2019-07-02 MED ORDER — GABAPENTIN 100 MG PO CAPS
200.0000 mg | ORAL_CAPSULE | Freq: Three times a day (TID) | ORAL | Status: DC
  Administered 2019-07-02 – 2019-07-07 (×15): 200 mg via ORAL
  Filled 2019-07-02 (×15): qty 2

## 2019-07-02 MED ORDER — NICOTINE 7 MG/24HR TD PT24
7.0000 mg | MEDICATED_PATCH | Freq: Every day | TRANSDERMAL | Status: DC
  Administered 2019-07-02 – 2019-07-07 (×6): 7 mg via TRANSDERMAL
  Filled 2019-07-02 (×9): qty 1

## 2019-07-02 MED ORDER — WARFARIN SODIUM 7.5 MG PO TABS: 7.5000 mg | ORAL_TABLET | Freq: Every day | ORAL | Status: DC

## 2019-07-02 MED ORDER — SODIUM CHLORIDE 0.9% FLUSH
3.0000 mL | Freq: Two times a day (BID) | INTRAVENOUS | Status: DC
  Administered 2019-07-02 – 2019-07-07 (×10): 3 mL via INTRAVENOUS

## 2019-07-02 MED ORDER — ACETAMINOPHEN 325 MG PO TABS: 650.0000 mg | ORAL_TABLET | ORAL | Status: DC | PRN

## 2019-07-02 MED ORDER — AMIODARONE HCL 200 MG PO TABS
200.0000 mg | ORAL_TABLET | Freq: Every day | ORAL | Status: DC
  Administered 2019-07-02: 200 mg via ORAL
  Filled 2019-07-02 (×2): qty 1

## 2019-07-02 MED ORDER — INSULIN ASPART 100 UNIT/ML ~~LOC~~ SOLN: 0.0000 [IU] | Freq: Every day | SUBCUTANEOUS | Status: DC

## 2019-07-02 MED ORDER — OXYCODONE HCL 5 MG PO TABS
10.0000 mg | ORAL_TABLET | Freq: Four times a day (QID) | ORAL | Status: DC | PRN
  Administered 2019-07-02 – 2019-07-07 (×13): 10 mg via ORAL
  Filled 2019-07-02 (×14): qty 2

## 2019-07-02 MED ORDER — SODIUM CHLORIDE 0.9% FLUSH: 3.0000 mL | INTRAVENOUS | Status: DC | PRN

## 2019-07-02 MED ORDER — SODIUM CHLORIDE 0.9 % IV SOLN: 250.0000 mL | INTRAVENOUS | Status: DC | PRN

## 2019-07-02 NOTE — Progress Notes (Signed)
Clinical Summary [name] [name] is a 64 y.o.male seen today for follow up of the following medical problems.    1. Chronic systolic HF -History of chronic systolic HF LVEF 02-72% by 2015 echo, has not followed up since 2016 in cardiology clinic. Cath in 2013 without significant disease, consistent with a NICM. - 04/2019 echo LVEF 25-30%, mod RV dysfunction - admitted 04/2019 with fluid overload   - last visit he was up 8 lbs from 04/2019 with significant LE edema, we increased his torsemide to 50m daily.  - had significant AKI with diuretic change, was to hold losartan and torsemide.  - up 5 lbs since then. Incrased LE edema. + fatigue.   2. LV thrombus - noted on echo during 04/2019 admission - started on coumadin   3. Afib - new diagnosis 04/2019 admission - last visit we started him on amio due to elevated HRs, soft bp's did not allow titration of beta blocker    4. CKD 3 - 06/15/19 Cr 1.65, up from 1.44 - 06/29/19 Cr all the way up to 3.77 when we changed him to torsemide 452mdaily, diuretic and ARD stopped at that time   5. NSVT - in setting of systolic dysfunction, NICM      Past Medical History:  Diagnosis Date  . Anxiety   . CHF (congestive heart failure) ([facility] [place]   a. EF 15% in 2013 with cath showing normal cors b. EF 30-35% by repeat echo in 2015  . Chronic systolic heart failure ([place]  . Diabetes mellitus   . Fluid retention in legs   . Hypertension      No Known Allergies   Current Outpatient Medications  Medication Sig Dispense Refill  . amiodarone (PACERONE) 200 MG tablet TAKE 400 MG TWICE DAILY FOR 1 WEEK THEN 200 MG TWICE DAILY FOR 2 WEEKS THEN TAKE 200 MG DAILY 180 tablet 1  . gabapentin (NEURONTIN) 100 MG capsule Take 2 capsules (200 mg total) by mouth 3 (three) times daily. 180 capsule 1  . losartan (COZAAR) 25 MG tablet Take 0.5 tablets (12.5 mg total) by mouth daily. 45 tablet 3  . metFORMIN (GLUCOPHAGE) 500 MG tablet Take  500 mg by mouth 2 (two) times daily.    . metoprolol succinate (TOPROL-XL) 25 MG 24 hr tablet Take 1.5 tablets (37.5 mg total) by mouth daily. 135 tablet 3  . Oxycodone HCl 10 MG TABS Take 10 mg by mouth 4 (four) times daily as needed.    . potassium chloride SA (KLOR-CON) 20 MEQ tablet Take 2 tablets (40 mEq total) by mouth daily. 180 tablet 3  . simvastatin (ZOCOR) 40 MG tablet Take 1 tablet (40 mg total) by mouth at bedtime. 90 tablet 1  . torsemide (DEMADEX) 20 MG tablet Take 2 tablets (40 mg total) by mouth daily. 90 tablet 1  . warfarin (COUMADIN) 2.5 MG tablet Take 3 tablets (7.5 mg total) by mouth daily at 4 PM. 90 tablet 1   No current facility-administered medications for this visit.     Past Surgical History:  Procedure Laterality Date  . COLONOSCOPY  01/04/2012   Procedure: COLONOSCOPY;  Surgeon: SaDanie BinderMD;  [facility];  Service: Endoscopy;  Laterality: N/A;  1:30 PM  . HERNIA REPAIR    . LEFT AND RIGHT HEART CATHETERIZATION WITH CORONARY ANGIOGRAM N/A 07/09/2011   Procedure: LEFT AND RIGHT HEART CATHETERIZATION WITH CORONARY ANGIOGRAM;  Surgeon: Peter M JoMartiniqueMD;  [facility];  Service: Cardiovascular;  Laterality: N/A;     No Known Allergies    Family History  Problem Relation Age of Onset  . Heart attack Father   . Heart failure Father   . Cancer Mother        Multiple myeloma  . Heart disease Sister      Social History [name] [name] reports that he quit smoking about 7 years ago. His smoking use included cigarettes and e-cigarettes. He started smoking about 36 years ago. He smoked 0.50 packs per day. He has never used smokeless tobacco. [name] [name] reports current alcohol use.   Review of Systems CONSTITUTIONAL: No weight loss, fever, chills, weakness or fatigue.  HEENT: Eyes: No visual loss, blurred vision, double vision or yellow sclerae.No hearing loss, sneezing, congestion, runny nose or sore throat.  SKIN: No rash or  itching.  CARDIOVASCULAR: per hpi RESPIRATORY: No shortness of breath, cough or sputum.  GASTROINTESTINAL: No anorexia, nausea, vomiting or diarrhea. No abdominal pain or blood.  GENITOURINARY: No burning on urination, no polyuria NEUROLOGICAL: No headache, dizziness, syncope, paralysis, ataxia, numbness or tingling in the extremities. No change in bowel or bladder control.  MUSCULOSKELETAL: No muscle, back pain, joint pain or stiffness.  LYMPHATICS: No enlarged nodes. No history of splenectomy.  PSYCHIATRIC: No history of depression or anxiety.  ENDOCRINOLOGIC: No reports of sweating, cold or heat intolerance. No polyuria or polydipsia.  Marland Kitchen   Physical Examination Today's Vitals   07/02/19 0834  BP: 90/60  Pulse: 68  SpO2: 97%  Weight: 224 lb (101.6 kg)  Height: 6' (1.829 m)   Body mass index is 30.38 kg/m.  Gen: resting comfortably, no acute distress HEENT: no scleral icterus, pupils equal round and reactive, no palptable cervical adenopathy,  CV: RRR, no m/r/g. +JVD Resp:mild crackles bilateral bases GI: abdomen is soft, non-tender, non-distended, normal bowel sounds, no hepatosplenomegaly MSK: extremities are warm, 3+ bialteral LE edema.  Skin: warm, no rash Neuro:  no focal deficits Psych: appropriate affect   Diagnostic Studies     Assessment and Plan  1. Acute on chronic systolic HF -up 13 lbs since 04/2019, severe volume overload on exam - did not tolerate outpatient titration of torsemide, severe AKI with Cr up to 3.77. Toresemide held -  difficultly diuresing as outpatient with severe AKI with diuretic change and soft bp's raise concern for worsening RV or LV failure, perhaps low flow HF.  - will need admission at [facility]. Repeat echo. Would start IV lasix 7m bid and follow output and renal function, if ongoing difficultly diuresing would need consideration for advanced heart failure eval and potential inotroptes   2. Afib - started on oral amio last  visit due elevated heart rates, soft bp's prevented av nodal agent titration - EKG today shows SR - continue coumadin   3. LV thrombus - would treat 3 months and repeat echo, if resolved could change to DOAC for his afib   4. AKI on CKD - occurred with increasing home torsemide, Cr up to 3.77 - ARB held, torsemide held initially as outpatient.  - primary issue likely cardiorenal, venous congestion and potential low output - follow renal function with initial attempts at diuresis. May require consideration for renal consultation     Discussed with patient[facility] Will require [facility] to medicine service with cardiology consultation.       JArnoldo Lenis M.D.

## 2019-07-02 NOTE — Progress Notes (Signed)
CRITICAL VALUE ALERT  Critical Value:  INR 5.5   Date & Time Notied:  07/02/2019 1631  Provider Notified: Dr. Manuella Ghazi   Orders Received/Actions taken: Pharmacy consulted for INR per Dr. Manuella Ghazi, no new orders received, coumadin to be held.

## 2019-07-02 NOTE — H&P (Signed)
History and Physical    [name] BCW:[id number] DOB: 02-Apr-1955 DOA: 07/02/2019  PCP: Lucia Gaskins, MD   Patient coming from: Cardiology Clinic  Chief Complaint: Volume overload  HPI: [name] [name] is a 64 y.o. male with medical history significant for systolic congestive heart failure with LVEF 30-35% on echocardiogram in 2015, LV thrombus currently on Coumadin, ongoing tobacco abuse, history of atrial fibrillation, hypertension, and type 2 diabetes with diabetic neuropathy who presented to the ED after being seen in the cardiology clinic earlier today by his cardiologist Dr. Harl Bowie.  He was noted to be volume overloaded with an approximately 13 pound weight gain since 04/2019.  He did not appear to tolerate increasing doses of torsemide at home and was noted to have severe AKI with creatinine up to 3.77 for which torsemide was held.  Since he was noted to have difficulty with diuresis in the outpatient setting, he was referred to the ED for admission for inpatient diuresis and close monitoring as well as repeat 2D echocardiogram.  He denies any chest pain or shortness of breath.  No orthopnea or paroxysmal nocturnal dyspnea noted.  No fevers, or chills, or cough.   ED Course: Stable vital signs noted.  And creatinine currently 1.73 which is near his baseline.  He is noted to have some mild transaminitis with AST 117 and ALT 617.  BNP is 2735.  Two-view chest x-ray with some cardiomegaly and right-sided pleural effusion and interstitial edema noted.  Covid testing negative.  IV Lasix has been ordered and pending.  Review of Systems: All others reviewed and otherwise negative except as noted above.  Past Medical History:  Diagnosis Date  . Anxiety   . CHF (congestive heart failure) ([place])    a. EF 15% in 2013 with cath showing normal cors b. EF 30-35% by repeat echo in 2015  . Chronic systolic heart failure ([place])   . Diabetes mellitus   . Fluid retention in legs   .  Hypertension     Past Surgical History:  Procedure Laterality Date  . COLONOSCOPY  01/04/2012   Procedure: COLONOSCOPY;  Surgeon: Danie Binder, MD;  Location: [facility];  Service: Endoscopy;  Laterality: N/A;  1:30 PM  . HERNIA REPAIR    . LEFT AND RIGHT HEART CATHETERIZATION WITH CORONARY ANGIOGRAM N/A 07/09/2011   Procedure: LEFT AND RIGHT HEART CATHETERIZATION WITH CORONARY ANGIOGRAM;  Surgeon: Peter M Martinique, MD;  [facility] C[facility] LAB;  Service: Cardiovascular;  Laterality: N/A;     reports that he has been smoking cigarettes and e-cigarettes. He started smoking about 36 years ago. He has been smoking about 0.50 packs per day. He has never used smokeless tobacco. He reports current alcohol use. He reports that he does not use drugs.  No Known Allergies  Family History  Problem Relation Age of Onset  . Heart attack Father   . Heart failure Father   . Cancer Mother        Multiple myeloma  . Heart disease Sister     Prior to Admission medications   Medication Sig Start Date End Date Taking? Authorizing Provider  amiodarone (PACERONE) 200 MG tablet TAKE 400 MG TWICE DAILY FOR 1 WEEK THEN 200 MG TWICE DAILY FOR 2 WEEKS THEN TAKE 200 MG DAILY 06/25/19   Arnoldo Lenis, MD  gabapentin (NEURONTIN) 100 MG capsule Take 2 capsules (200 mg total) by mouth 3 (three) times daily. 04/27/19   Orson Eva, MD  losartan (COZAAR)  25 MG tablet Take 0.5 tablets (12.5 mg total) by mouth daily. Patient not taking: Reported on 07/02/2019 05/11/19   Erma Heritage, PA-C  metFORMIN (GLUCOPHAGE) 500 MG tablet Take 500 mg by mouth 2 (two) times daily. 04/20/19   [provider]  metoprolol succinate (TOPROL-XL) 25 MG 24 hr tablet Take 1.5 tablets (37.5 mg total) by mouth daily. 05/11/19   Strader, Fransisco Hertz, PA-C  Oxycodone HCl 10 MG TABS Take 10 mg by mouth 4 (four) times daily as needed. 04/11/19   [provider]  potassium chloride SA (KLOR-CON) 20 MEQ tablet Take 2 tablets (40  mEq total) by mouth daily. 06/15/19   Strader, Fransisco Hertz, PA-C  simvastatin (ZOCOR) 40 MG tablet Take 1 tablet (40 mg total) by mouth at bedtime. 08/15/14   Tysinger, Camelia Eng, PA-C  torsemide (DEMADEX) 20 MG tablet Take 2 tablets (40 mg total) by mouth daily. Patient not taking: Reported on 07/02/2019 06/25/19   Arnoldo Lenis, MD  warfarin (COUMADIN) 2.5 MG tablet Take 3 tablets (7.5 mg total) by mouth daily at 4 PM. 04/28/19   Orson Eva, MD    Physical Exam: Vitals:   07/02/19 1105 07/02/19 1115 07/02/19 1130  BP: 103/74  97/79  Pulse: 73 73 72  Resp: (!) 24 (!) 23 20  Temp: 98.3 F (36.8 C)    TempSrc: Oral    SpO2: 100% 97% 97%  Weight: 102.1 kg    Height: 6' (1.829 m)      Constitutional: NAD, calm, comfortable Vitals:   07/02/19 1105 07/02/19 1115 07/02/19 1130  BP: 103/74  97/79  Pulse: 73 73 72  Resp: (!) 24 (!) 23 20  Temp: 98.3 F (36.8 C)    TempSrc: Oral    SpO2: 100% 97% 97%  Weight: 102.1 kg    Height: 6' (1.829 m)     Eyes: lids and conjunctivae normal ENMT: Mucous membranes are moist.  Neck: normal, supple Respiratory: clear to auscultation bilaterally. Normal respiratory effort. No accessory muscle use.  Cardiovascular: Regular rate and rhythm, no murmurs. No extremity edema. Abdomen: no tenderness, no distention. Bowel sounds positive.  Musculoskeletal:  No joint deformity upper and lower extremities.   Skin: no rashes, lesions, ulcers.  Psychiatric: Normal judgment and insight. Alert and oriented x 3. Normal mood.   Labs on Admission: I have personally reviewed following labs and imaging studies  CBC: Recent Labs  Lab 07/02/19 1130  WBC 6.5  NEUTROABS 4.4  HGB 12.4*  HCT 38.0*  MCV 103.3*  PLT 537   Basic Metabolic Panel: Recent Labs  Lab 06/29/19 1556 07/02/19 1130  NA 136 139  K 3.7 4.0  CL 97* 101  CO2 23 25  GLUCOSE 151* 168*  BUN 76* 41*  CREATININE 3.77* 1.73*  CALCIUM 7.3* 7.4*   GFR: Estimated Creatinine Clearance:  54.7 mL/min (A) (by C-G formula based on SCr of 1.73 mg/dL (H)). Liver Function Tests: Recent Labs  Lab 07/02/19 1130  AST 117*  ALT 617*  ALKPHOS 100  BILITOT 0.4  PROT 6.7  ALBUMIN 3.5   No results for input(s): LIPASE, AMYLASE in the last 168 hours. No results for input(s): AMMONIA in the last 168 hours. Coagulation Profile: No results for input(s): INR, PROTIME in the last 168 hours. Cardiac Enzymes: No results for input(s): CKTOTAL, CKMB, CKMBINDEX, TROPONINI in the last 168 hours. BNP (last 3 results) No results for input(s): PROBNP in the last 8760 hours. HbA1C: No results for input(s): HGBA1C  in the last 72 hours. CBG: No results for input(s): GLUCAP in the last 168 hours. Lipid Profile: No results for input(s): CHOL, HDL, LDLCALC, TRIG, CHOLHDL, LDLDIRECT in the last 72 hours. Thyroid Function Tests: No results for input(s): TSH, T4TOTAL, FREET4, T3FREE, THYROIDAB in the last 72 hours. Anemia Panel: No results for input(s): VITAMINB12, FOLATE, FERRITIN, TIBC, IRON, RETICCTPCT in the last 72 hours. Urine analysis:    Component Value Date/Time   COLORURINE YELLOW 06/28/2006 1102   APPEARANCEUR CLEAR 06/28/2006 1102   LABSPEC 1.020 06/28/2006 1102   PHURINE 6.0 06/28/2006 1102   GLUCOSEU NEGATIVE 06/28/2006 1102   HGBUR NEGATIVE 06/28/2006 1102   BILIRUBINUR NEGATIVE 06/28/2006 1102   KETONESUR NEGATIVE 06/28/2006 1102   PROTEINUR NEGATIVE 06/28/2006 1102   UROBILINOGEN 1.0 06/28/2006 1102   NITRITE NEGATIVE 06/28/2006 1102   LEUKOCYTESUR  06/28/2006 1102    NEGATIVE MICROSCOPIC NOT DONE ON URINES WITH NEGATIVE PROTEIN, BLOOD, LEUKOCYTES, NITRITE, OR GLUCOSE <1000 mg/dL.    Radiological Exams on Admission: DG Chest 2 View  Result Date: 07/02/2019 CLINICAL DATA:  64 year old male with lower extremity swelling for 3 weeks. Cough. Query edema. EXAM: CHEST - 2 VIEW COMPARISON:  Portable chest 04/23/2019 and earlier. FINDINGS: Cardiomegaly again suspected with  stable cardiac silhouette. Normal lung volumes. Patchy right lung base opacity which on the lateral view is suspicious for pleural effusion partially tracking into the major fissure. Small volume of minor fissure fluid also. Pulmonary vascularity is increased compared to 2014 radiographs. No left pleural effusion, pneumothorax, or other confluent opacity. Visualized tracheal air column is within normal limits. No acute osseous abnormality identified. Negative visible bowel gas pattern. IMPRESSION: 1. Cardiomegaly with right lung base opacity favored due to pleural effusion partially tracking into the fissures. 2. Increased pulmonary vascularity compatible with mild or developing interstitial edema. Electronically Signed   By: Genevie Ann M.D.   On: 07/02/2019 12:12    Assessment/Plan Active Problems:   Acute on chronic systolic (congestive) heart failure (HCC)    Acute on chronic systolic congestive heart failure exacerbation -Prior 2D echocardiogram in 2015 with LVEF 30-35%, plan to repeat during this hospitalization -Start diuresis with IV Lasix 80 mg twice daily -Follow daily weights and strict I's and O's -Fluid restriction of 1500 mL -Plan to hold losartan, metoprolol, and Demadex on account of softer blood pressure readings -Consult to cardiology appreciated  History of atrial fibrillation -Currently noted to be in sinus rhythm -Continue amiodarone as well as Coumadin  Hypertension -Holding losartan, metoprolol, and home Demadex due to softer blood pressure readings -Monitor closely on IV Lasix diuresis  LV thrombus -Continue on Coumadin for now -May consider switching to [place] if resolved on repeat echocardiogram  CKD stage IIIa -Baseline creatinine 1.4-1.7 -Home torsemide and ARB was held on account of recent AKI with creatinine up to 3.77 -Current creatinine level 1.73 -Continue to follow a.m. labs  Mild transaminitis -Likely secondary to congestive hepatopathy -Hold statin  for now -Monitor trend  Type 2 diabetes -Hold Metformin -Maintain on carb modified diet -SSI -Hold statin and hold ARB  Diabetic neuropathy -Continue home gabapentin  Ongoing tobacco abuse -Counseled on cessation   DVT prophylaxis: Continue Coumadin Code Status: Full Family Communication:  Disposition Plan:Admit for Treatment of CHF exacerbation Consults called:Cardiology Admission status: Inpatient, Tele   Yalitza Teed D Kleber Crean DO [facility]  If 7PM-7AM, please contact night-coverage [email or web address]  07/02/2019, 2:06 PM

## 2019-07-02 NOTE — ED Triage Notes (Signed)
Advised by Dr Harl Bowie to come in for evaluation of abdominal swelling

## 2019-07-02 NOTE — ED Notes (Signed)
Attempted Report x1 to 3rd floor at this time and nurse assigned unable to take the call.

## 2019-07-02 NOTE — Patient Instructions (Signed)
Your physician recommends that you schedule a follow-up appointment in: TO BE DETERMINED WITH DR BRANCH  PLEASE GO TO [place]   Thank you for choosing [facility]!!

## 2019-07-02 NOTE — Progress Notes (Signed)
ANTICOAGULATION CONSULT NOTE - Initial Consult  Pharmacy Consult for Coumadin Indication: atrial fibrillation/LV thrombu  No Known Allergies  Patient Measurements: Height: 6' (182.9 cm) Weight: 102.1 kg (225 lb) IBW/kg (Calculated) : 77.6  Vital Signs: Temp: 97.8 F (36.6 C) (06/14 1602) Temp Source: Oral (06/14 1602) BP: 95/73 (06/14 1602) Pulse Rate: 72 (06/14 1602)  Labs: Recent Labs    07/02/19 1130  HGB 12.4*  HCT 38.0*  PLT 198  LABPROT 48.2*  INR 5.5*  CREATININE 1.73*    Estimated Creatinine Clearance: 54.7 mL/min (A) (by C-G formula based on SCr of 1.73 mg/dL (H)).   Medical History: Past Medical History:  Diagnosis Date  . Anxiety   . CHF (congestive heart failure) ([place])    a. EF 15% in 2013 with cath showing normal cors b. EF 30-35% by repeat echo in 2015  . Chronic systolic heart failure ([place])   . Diabetes mellitus   . Fluid retention in legs   . Hypertension     Medications:  Medications Prior to Admission  Medication Sig Dispense Refill Last Dose  . amiodarone (PACERONE) 200 MG tablet TAKE 400 MG TWICE DAILY FOR 1 WEEK THEN 200 MG TWICE DAILY FOR 2 WEEKS THEN TAKE 200 MG DAILY 180 tablet 1 07/01/2019 at Unknown time  . gabapentin (NEURONTIN) 100 MG capsule Take 2 capsules (200 mg total) by mouth 3 (three) times daily. 180 capsule 1 07/01/2019 at Unknown time  . metFORMIN (GLUCOPHAGE) 500 MG tablet Take 500 mg by mouth 2 (two) times daily.   07/01/2019 at Unknown time  . metoprolol succinate (TOPROL-XL) 25 MG 24 hr tablet Take 1.5 tablets (37.5 mg total) by mouth daily. 135 tablet 3 07/01/2019 at Unknown time  . Oxycodone HCl 10 MG TABS Take 10 mg by mouth 4 (four) times daily as needed.   07/01/2019 at Unknown time  . potassium chloride SA (KLOR-CON) 20 MEQ tablet Take 2 tablets (40 mEq total) by mouth daily. 180 tablet 3 07/01/2019 at Unknown time  . simvastatin (ZOCOR) 40 MG tablet Take 1 tablet (40 mg total) by mouth at bedtime. 90 tablet 1 07/01/2019  at Unknown time  . warfarin (COUMADIN) 2.5 MG tablet Take 3 tablets (7.5 mg total) by mouth daily at 4 PM. 90 tablet 1 07/01/2019 at Unknown time  . losartan (COZAAR) 25 MG tablet Take 0.5 tablets (12.5 mg total) by mouth daily. (Patient not taking: Reported on 07/02/2019) 45 tablet 3 Not Taking at Unknown time  . torsemide (DEMADEX) 20 MG tablet Take 2 tablets (40 mg total) by mouth daily. (Patient not taking: Reported on 07/02/2019) 90 tablet 1 Not Taking at Unknown time    Assessment: 64 y.o. male with medical history significant for systolic congestive heart failure with LVEF 30-35% on echocardiogram in 2015, LV thrombus currently on Coumadin, ongoing tobacco abuse, history of atrial fibrillation, hypertension, and type 2 diabetes with diabetic neuropathy. Presented to the ED with bolume overload, 14 ob weight gain. Pharmacy asked to dose coumadin. INR supratherapeutic at 5.5.  Home dose is 3.75mg  on M,W,F and 5mg  on T, Th, Sat, Sun  Goal of Therapy:  INR 2-3 Monitor platelets by anticoagulation protocol: Yes   Plan:  No coumadin today Daily PT-INR Monitor for S/S of bleeding  Isac Sarna, BS [place], BCPS Clinical Pharmacist Pager [phone] 07/02/2019,4:27 PM

## 2019-07-03 ENCOUNTER — Inpatient Hospital Stay (HOSPITAL_COMMUNITY): Payer: Medicare PPO

## 2019-07-03 DIAGNOSIS — I5023 Acute on chronic systolic (congestive) heart failure: Secondary | ICD-10-CM

## 2019-07-03 DIAGNOSIS — N1831 Chronic kidney disease, stage 3a: Secondary | ICD-10-CM

## 2019-07-03 DIAGNOSIS — I472 Ventricular tachycardia: Secondary | ICD-10-CM

## 2019-07-03 DIAGNOSIS — E1121 Type 2 diabetes mellitus with diabetic nephropathy: Secondary | ICD-10-CM

## 2019-07-03 DIAGNOSIS — I4819 Other persistent atrial fibrillation: Secondary | ICD-10-CM

## 2019-07-03 DIAGNOSIS — N179 Acute kidney failure, unspecified: Secondary | ICD-10-CM

## 2019-07-03 LAB — CBC WITH DIFFERENTIAL/PLATELET
Abs Immature Granulocytes: 0.02 10*3/uL (ref 0.00–0.07)
Basophils Absolute: 0 10*3/uL (ref 0.0–0.1)
Basophils Relative: 0 %
Eosinophils Absolute: 0.1 10*3/uL (ref 0.0–0.5)
Eosinophils Relative: 2 %
HCT: 36.7 % — ABNORMAL LOW (ref 39.0–52.0)
Hemoglobin: 11.8 g/dL — ABNORMAL LOW (ref 13.0–17.0)
Immature Granulocytes: 0 %
Lymphocytes Relative: 28 %
Lymphs Abs: 1.5 10*3/uL (ref 0.7–4.0)
MCH: 33.2 pg (ref 26.0–34.0)
MCHC: 32.2 g/dL (ref 30.0–36.0)
MCV: 103.4 fL — ABNORMAL HIGH (ref 80.0–100.0)
Monocytes Absolute: 0.4 10*3/uL (ref 0.1–1.0)
Monocytes Relative: 8 %
Neutro Abs: 3.3 10*3/uL (ref 1.7–7.7)
Neutrophils Relative %: 62 %
Platelets: 182 10*3/uL (ref 150–400)
RBC: 3.55 MIL/uL — ABNORMAL LOW (ref 4.22–5.81)
RDW: 14.7 % (ref 11.5–15.5)
WBC: 5.4 10*3/uL (ref 4.0–10.5)
nRBC: 0 % (ref 0.0–0.2)

## 2019-07-03 LAB — COMPREHENSIVE METABOLIC PANEL
ALT: 448 U/L — ABNORMAL HIGH (ref 0–44)
AST: 64 U/L — ABNORMAL HIGH (ref 15–41)
Albumin: 3.1 g/dL — ABNORMAL LOW (ref 3.5–5.0)
Alkaline Phosphatase: 82 U/L (ref 38–126)
Anion gap: 10 (ref 5–15)
BUN: 32 mg/dL — ABNORMAL HIGH (ref 8–23)
CO2: 29 mmol/L (ref 22–32)
Calcium: 7.4 mg/dL — ABNORMAL LOW (ref 8.9–10.3)
Chloride: 103 mmol/L (ref 98–111)
Creatinine, Ser: 1.28 mg/dL — ABNORMAL HIGH (ref 0.61–1.24)
GFR calc Af Amer: >60 mL/min (ref >60)
GFR calc non Af Amer: 60 mL/min — ABNORMAL LOW (ref >60)
Glucose, Bld: 107 mg/dL — ABNORMAL HIGH (ref 70–99)
Potassium: 3.4 mmol/L — ABNORMAL LOW (ref 3.5–5.1)
Sodium: 142 mmol/L (ref 135–145)
Total Bilirubin: 1.3 mg/dL — ABNORMAL HIGH (ref 0.3–1.2)
Total Protein: 6.2 g/dL — ABNORMAL LOW (ref 6.5–8.1)

## 2019-07-03 LAB — GLUCOSE, CAPILLARY
Glucose-Capillary: 90 mg/dL (ref 70–99)
Glucose-Capillary: 259 mg/dL — ABNORMAL HIGH (ref 70–99)
Glucose-Capillary: 86 mg/dL (ref 70–99)
Glucose-Capillary: 128 mg/dL — ABNORMAL HIGH (ref 70–99)

## 2019-07-03 LAB — ECHOCARDIOGRAM COMPLETE
Height: 72 in
Weight: 3492.09 oz

## 2019-07-03 LAB — MAGNESIUM: Magnesium: 1.4 mg/dL — ABNORMAL LOW (ref 1.7–2.4)

## 2019-07-03 LAB — PROTIME-INR
INR: 3 — ABNORMAL HIGH (ref 0.8–1.2)
Prothrombin Time: 29.9 seconds — ABNORMAL HIGH (ref 11.4–15.2)

## 2019-07-03 MED ORDER — AMIODARONE HCL 200 MG PO TABS
200.0000 mg | ORAL_TABLET | Freq: Two times a day (BID) | ORAL | Status: DC
  Administered 2019-07-03 – 2019-07-07 (×9): 200 mg via ORAL
  Filled 2019-07-03 (×8): qty 1

## 2019-07-03 MED ORDER — MAGNESIUM SULFATE 2 GM/50ML IV SOLN
2.0000 g | Freq: Once | INTRAVENOUS | Status: AC
  Administered 2019-07-03: 2 g via INTRAVENOUS
  Filled 2019-07-03: qty 50

## 2019-07-03 NOTE — Progress Notes (Signed)
*  PRELIMINARY RESULTS* Echocardiogram 2D Echocardiogram has been performed.  [name] [name] 07/03/2019, 10:27 AM

## 2019-07-03 NOTE — TOC Initial Note (Signed)
Transition of Care [facility]) - Initial/Assessment Note    Patient Details  Name: [name] [name] MRN: [id number] Date of Birth: 01-02-1956  [facility]) CM/SW Contact:    Ihor Gully, LCSW Phone Number: 07/03/2019, 2:37 PM  Clinical Narrative:                 [name] provided history. From home with wife and son. Admitted for CHF exacerbation. Independent at baseline, drives. Does not follow a heart healthy diet. Takes weights about three times per week. Maintains medical appointments. No barriers to obtaining medications.  TOC will follow through discharge and provide services as identified.   Expected Discharge Plan: Home/Self Care Barriers to Discharge: Continued Medical Work up   Patient Goals and CMS Choice        Expected Discharge Plan and Services Expected Discharge Plan: Home/Self Care       Living arrangements for the past 2 months: Single Family Home                                      Prior Living Arrangements/Services Living arrangements for the past 2 months: Single Family Home Lives with:: Adult Children, Spouse Patient language and need for interpreter reviewed:: Yes        Need for Family Participation in Patient Care: Yes (Comment) Care giver support system in place?: Yes (comment)   Criminal Activity/Legal Involvement Pertinent to Current Situation/Hospitalization: No - Comment as needed  Activities of Daily Living Home Assistive Devices/Equipment: None ADL Screening (condition at time of admission) Patient's cognitive ability adequate to safely complete daily activities?: Yes Is the patient deaf or have difficulty hearing?: No Does the patient have difficulty seeing, even when wearing glasses/contacts?: No Does the patient have difficulty concentrating, remembering, or making decisions?: No Patient able to express need for assistance with ADLs?: Yes Does the patient have difficulty dressing or bathing?:  No Independently performs ADLs?: Yes (appropriate for developmental age) Does the patient have difficulty walking or climbing stairs?: No Weakness of Legs: None Weakness of Arms/Hands: None  Permission Sought/Granted                  Emotional Assessment Appearance:: Appears stated age   Affect (typically observed): Unable to Assess Orientation: : Oriented to Self, Oriented to Place, Oriented to  Time, Oriented to Situation Alcohol / Substance Use: Not Applicable    Admission diagnosis:  Acute on chronic systolic (congestive) heart failure (HCC) [I50.23] Acute congestive heart failure, unspecified heart failure type [facility]) [I50.9] Patient Active Problem List   Diagnosis Date Noted  . Atrial fibrillation ([place]) 04/30/2019  . Thrombus in heart chamber 04/30/2019  . Acute respiratory failure with hypoxia ([place]) 04/25/2019  . Acute on chronic systolic (congestive) heart failure ([place]) 04/23/2019  . Chronic pain 04/23/2019  . Chronic renal failure, stage 3a 04/23/2019  . Generalized anxiety disorder 08/14/2014  . Diabetes mellitus type 2, uncontrolled ([place]) 08/14/2014  . Hyperlipidemia 08/14/2014  . Onychomycosis 08/14/2014  . Hypertrophic toenail 08/14/2014  . Tinea versicolor 08/14/2014  . Hepatomegaly 08/14/2014  . Pain in joint, upper arm 03/06/2014  . Cardiomyopathy, dilated, nonischemic ([place]) 10/12/2013  . Chronic systolic heart failure ([place]) 07/08/2011  . Type 2 diabetes with nephropathy ([place]) 07/08/2011  . Hypertension 07/08/2011   PCP:  Lucia Gaskins, MD Pharmacy:   [facility], [place]  [address] [place] [address] Phone: [phone] Fax: [phone]     Social Determinants of Health (SDOH) Interventions    Readmission Risk Interventions No flowsheet data found.

## 2019-07-03 NOTE — Progress Notes (Signed)
MD notified of 7 beats of V tach per central tele. Pt talking on the phone with no distress noted. Denies discomfort. Call bell in reach.

## 2019-07-03 NOTE — Consult Note (Addendum)
Cardiology Consult    Patient ID: [name] [name]; [id number]; June 18, 1955   Admit date: 07/02/2019 Date of Consult: 07/03/2019  Primary Care Provider: Lucia Gaskins, MD Primary Cardiologist: Carlyle Dolly, MD   Patient Profile    [name] [name] is a 64 y.o. 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 Manuella Ghazi.   History of Present Illness    [name] was examined by Dr. Harl Bowie on 06/25/2019 and was volume overloaded on examination and Torsemide was titrated from 20 mg daily to 14m daily. His heart rate was initially elevated in the 120's and it was felt that his uncontrolled atrial fibrillation was contributing to his volume overload. Given his labile renal function, Digoxin was avoided he was started on Amiodarone 4075mBID for 1 week, 20038mID for 2 weeks then 200 mg daily. Repeat BMET on 6/11 showed creatinine had trended upwards from 1.65 to 3.77, therefore he was informed to hold Torsemide and Losartan over the weekend. He was examined by Dr. BraHarl Bowiesterday and weight had trended up 5 lbs since holding his diuretic and [facility].   Initial labs showed WBC 6.5, Hgb 12.4, platelets 198, Na+ 139, K+ 4.0 and creatinine 1.73 (baseline ~ 1.6). AST 117 and ALT 617. BNP 2735. INR 5.5. Hgb A1c 7.9. COVID negative. CXR showed cardiomegaly and right lung base opacity felt to be most consistent with a pleural effusion along with interstitial edema. EKG shows NSR, HR 68 with PVC's and known RBBB.    He was started on IV Lasix 17m16mD at the  time of admission with a recorded output of -1.7L thus far. Weight initially 225 lbs, at 218 lbs this AM (weight was 213 lbs at the time of hospital discharge in 04/2019).   Repeat labs this AM show K+ is low at 3.4 but creatinine has improved to 1.28. AST at 64 and ALT 448. Mg is low at 1.4 and will replace.   He reports already experiencing significant improvement in his lower extremity edema. Still with some orthopnea but improved. No chest pain or palpitations. He was experiencing intermittent palpitations when previously in atrial fibrillation.    Past Medical History:  Diagnosis Date   Anxiety    CHF (congestive heart failure) (HCC)[place] a. EF 15% in 2013 with cath showing normal cors b. EF 30-35% by repeat echo in [id number] systolic heart failure (HCC)[place] Diabetes mellitus    Fluid retention in legs    Hypertension     Past Surgical History:  Procedure Laterality Date   COLONOSCOPY  01/04/2012   Procedure: COLONOSCOPY;  Surgeon: SandDanie Binder;  Location: [facility];  Service: Endoscopy;  Laterality: N/A;  1:30 PM   HERNIA REPAIR     LEFT AND RIGHT HEART CATHETERIZATION WITH CORONARY ANGIOGRAM N/A 07/09/2011   Procedure: LEFT AND RIGHT HEART CATHETERIZATION WITH CORONARY ANGIOGRAM;  Surgeon: Lauralei Clouse M JordMartinique;  [facility];  Service: Cardiovascular;  Laterality: N/A;  Home Medications:  Prior to Admission medications   Medication Sig Start Date End Date Taking? Authorizing Provider  amiodarone (PACERONE) 200 MG tablet TAKE 400 MG TWICE DAILY FOR 1 WEEK THEN 200 MG TWICE DAILY FOR 2 WEEKS THEN TAKE 200 MG DAILY 06/25/19  Yes Branch, Alphonse Guild, MD  gabapentin (NEURONTIN) 100 MG capsule Take 2 capsules (200 mg total) by mouth 3 (three) times daily. 04/27/19  Yes Tat, Shanon Brow, MD  metFORMIN (GLUCOPHAGE) 500 MG tablet Take 500 mg by mouth 2 (two) times daily. 04/20/19  Yes [provider]  metoprolol succinate (TOPROL-XL) 25 MG 24 hr tablet Take 1.5 tablets  (37.5 mg total) by mouth daily. 05/11/19  Yes Strader, [place] M, PA-C  Oxycodone HCl 10 MG TABS Take 10 mg by mouth 4 (four) times daily as needed. 04/11/19  Yes [provider]  potassium chloride SA (KLOR-CON) 20 MEQ tablet Take 2 tablets (40 mEq total) by mouth daily. 06/15/19  Yes Strader, [place] M, PA-C  simvastatin (ZOCOR) 40 MG tablet Take 1 tablet (40 mg total) by mouth at bedtime. 08/15/14  Yes Tysinger, Camelia Eng, PA-C  warfarin (COUMADIN) 2.5 MG tablet Take 3 tablets (7.5 mg total) by mouth daily at 4 PM. 04/28/19  Yes Tat, Shanon Brow, MD  losartan (COZAAR) 25 MG tablet Take 0.5 tablets (12.5 mg total) by mouth daily. Patient not taking: Reported on 07/02/2019 05/11/19   Erma Heritage, PA-C  torsemide (DEMADEX) 20 MG tablet Take 2 tablets (40 mg total) by mouth daily. Patient not taking: Reported on 07/02/2019 06/25/19   Arnoldo Lenis, MD    Inpatient Medications: Scheduled Meds:  amiodarone  200 mg Oral BID   furosemide  80 mg Intravenous Q12H   gabapentin  200 mg Oral TID   insulin aspart  0-15 Units Subcutaneous TID WC   insulin aspart  0-5 Units Subcutaneous QHS   nicotine  7 mg Transdermal Daily   potassium chloride SA  40 mEq Oral Daily   sodium chloride flush  3 mL Intravenous Q12H   Continuous Infusions:  sodium chloride     magnesium sulfate bolus IVPB     PRN Meds: sodium chloride, acetaminophen, ondansetron (ZOFRAN) IV, oxyCODONE, sodium chloride flush  Allergies:   No Known Allergies  Social History:   Social History   Socioeconomic History   Marital status: Married    Spouse name: Not on file   Number of children: 0   Years of education: Not on file   Highest education level: Not on file  Occupational History   Occupation: disabled  Tobacco Use   Smoking status: Current Every Day Smoker    Packs/day: 0.50    Types: Cigarettes, E-cigarettes    Start date: 10/13/1982    Last attempt to quit: 09/19/2011    Years since quitting: 7.7   Smokeless  tobacco: Never Used   Tobacco comment: electronic cig for 4 months   Vaping Use   Vaping Use: Never used  Substance and Sexual Activity   Alcohol use: Yes    Comment: weekends   Drug use: No   Sexual activity: Not on file  Other Topics Concern   Not on file  Social History Narrative   Patient is right handed.   Patient seldom drinks caffeine.   Social Determinants of Health   Financial Resource Strain:    Difficulty of Paying Living Expenses:   Food Insecurity:    Worried About Charity fundraiser in the Last Year:  Ran Out of Food in the Last Year:   Transportation Needs:    Film/video editor (Medical):    Lack of Transportation (Non-Medical):   Physical Activity:    Days of Exercise per Week:    Minutes of Exercise per Session:   Stress:    Feeling of Stress :   Social Connections:    Frequency of Communication with Friends and Family:    Frequency of Social Gatherings with Friends and Family:    Attends Religious Services:    Active Member of Clubs or Organizations:    Attends Music therapist:    Marital Status:   Intimate Partner Violence:    Fear of Current or Ex-Partner:    Emotionally Abused:    Physically Abused:    Sexually Abused:      Family History:    Family History  Problem Relation Age of Onset   Heart attack Father    Heart failure Father    Cancer Mother        Multiple myeloma   Heart disease Sister       Review of Systems    General:  No chills, fever, night sweats or weight changes.  Cardiovascular:  No chest pain, orthopnea, palpitations, paroxysmal nocturnal dyspnea. Positive for dyspnea on exertion, orthopnea and edema.  Dermatological: No rash, lesions/masses Respiratory: No cough, Positive for dyspnea. Urologic: No hematuria, dysuria Abdominal:   No nausea, vomiting, diarrhea, bright red blood per rectum, melena, or hematemesis Neurologic:  No visual changes, wkns, changes in mental status. All other  systems reviewed and are otherwise negative except as noted above.  Physical Exam/Data    Vitals:   07/02/19 2035 07/03/19 0018 07/03/19 0429 07/03/19 0600  BP: 105/73 90/60 101/77   Pulse: 73 63 75   Resp:  20 20   Temp: 98.6 F (37 C) 97.7 F (36.5 C) 98.1 F (36.7 C)   TempSrc: Oral Oral Oral   SpO2: 99% 99% (!) 89%   Weight:    99 kg  Height:        Intake/Output Summary (Last 24 hours) at 07/03/2019 0938 Last data filed at 07/03/2019 0430 Gross per 24 hour  Intake --  Output 1700 ml  Net -1700 ml   Filed Weights   07/02/19 1105 07/02/19 1702 07/03/19 0600  Weight: 102.1 kg 100.2 kg 99 kg   Body mass index is 29.6 kg/m.   General: Pleasant male appearing in NAD Psych: Normal affect. Neuro: Alert and oriented X 3. Moves all extremities spontaneously. HEENT: Normal  Neck: Supple without bruits or JVD. Lungs:  Resp regular and unlabored, decreased breath sounds along bases bilaterally. Heart: RRR with occasional ectopic beats. No s3, s4, or murmurs. Abdomen: Soft, non-tender, non-distended, BS + x 4.  Extremities: No clubbing or cyanosis. 1+ pitting edema bilaterally. DP/PT/Radials 2+ and equal bilaterally.   EKG:  The EKG was personally reviewed and demonstrates: NSR, HR 68 with PVC's and known RBBB.    Telemetry:  Telemetry was personally reviewed and demonstrates:  NSR, HR 70's to 80's. Episodes of NSVT, longest being 19 beats.    Labs/Studies     Relevant CV Studies:  Echocardiogram: 04/2019 IMPRESSIONS     1. The is a 1.0 x 0.92 cm apical thrombus that is semi mobile. . Left  ventricular ejection fraction, by estimation, is 25 to 30%. The left  ventricle has severely decreased function. The left ventricle demonstrates  global hypokinesis. There is mild left  ventricular  hypertrophy. Left ventricular diastolic parameters are  indeterminate.   2. Right ventricular systolic function is moderately reduced. The right  ventricular size is mildly  enlarged. There is moderately elevated  pulmonary artery systolic pressure.   3. Left atrial size was severely dilated.   4. The mitral valve is normal in structure. No evidence of mitral valve  regurgitation. No evidence of mitral stenosis.   5. The aortic valve is tricuspid. Aortic valve regurgitation is not  visualized. No aortic stenosis is present.   6. Indeterminant PASP, inadequate TR jet.   7. The inferior vena cava is normal in size with greater than 50%  respiratory variability, suggesting right atrial pressure of 3 mmHg.   Laboratory Data:  Chemistry Recent Labs  Lab 06/29/19 1556 07/02/19 1130 07/03/19 0614  NA 136 139 142  K 3.7 4.0 3.4*  CL 97* 101 103  CO2 '23 25 29  ' GLUCOSE 151* 168* 107*  BUN 76* 41* 32*  CREATININE 3.77* 1.73* 1.28*  CALCIUM 7.3* 7.4* 7.4*  GFRNONAA 16* 41* 60*  GFRAA 19* 48* >60  ANIONGAP 16* 13 10    Recent Labs  Lab 07/02/19 1130 07/03/19 0614  PROT 6.7 6.2*  ALBUMIN 3.5 3.1*  AST 117* 64*  ALT 617* 448*  ALKPHOS 100 82  BILITOT 0.4 1.3*   Hematology Recent Labs  Lab 07/02/19 1130 07/03/19 0614  WBC 6.5 5.4  RBC 3.68* 3.55*  HGB 12.4* 11.8*  HCT 38.0* 36.7*  MCV 103.3* 103.4*  MCH 33.7 33.2  MCHC 32.6 32.2  RDW 14.7 14.7  PLT 198 182   Cardiac EnzymesNo results for input(s): TROPONINI in the last 168 hours. No results for input(s): TROPIPOC in the last 168 hours.  BNP Recent Labs  Lab 07/02/19 1130  BNP 2,735.0*    DDimer No results for input(s): DDIMER in the last 168 hours.  Radiology/Studies:  DG Chest 2 View  Result Date: 07/02/2019 CLINICAL DATA:  64 year old male with lower extremity swelling for 3 weeks. Cough. Query edema. EXAM: CHEST - 2 VIEW COMPARISON:  Portable chest 04/23/2019 and earlier. FINDINGS: Cardiomegaly again suspected with stable cardiac silhouette. Normal lung volumes. Patchy right lung base opacity which on the lateral view is suspicious for pleural effusion partially tracking into the  major fissure. Small volume of minor fissure fluid also. Pulmonary vascularity is increased compared to 2014 radiographs. No left pleural effusion, pneumothorax, or other confluent opacity. Visualized tracheal air column is within normal limits. No acute osseous abnormality identified. Negative visible bowel gas pattern. IMPRESSION: 1. Cardiomegaly with right lung base opacity favored due to pleural effusion partially tracking into the fissures. 2. Increased pulmonary vascularity compatible with mild or developing interstitial edema. Electronically Signed   By: Genevie Ann M.D.   On: 07/02/2019 12:12     Assessment & Plan    1. Acute on Chronic Combined Systolic and Diastolic CHF/NICM - EF was previously 15% in 2013 with cath showing normal cors, EF 30-35% by repeat echo in 2015 with no outpatient follow-up from 2016 to 04/2019, EF at 25-30% by echo in 04/2019.  - outpatient diuresis was limited secondary to worsening renal function. - BNP elevated to 2735 on admission and CXR showed cardiomegaly and right lung base opacity felt to be most consistent with a pleural effusion along with interstitial edema.  - would continue with IV Lasix 42m BID as he has a recorded output of -1.7L thus far and weight has declined from 225 lbs to 218 lbs this  AM. Baseline of 211 - 213 lbs so would anticipate another 1-2 days of IV diuresis.  - PTA Toprol-XL and Losartan have been held due to hypotension (BP at 90/56 - 121/95 within the past 24 hours). Would anticipate restarting Toprol-XL tomorrow if BP allows. No ACE-I/ARB/ARNI for now given hypotension and AKI. Will need to rediscuss ICD placement if EF remains reduced as he declined placement in 2016.   2. Apical Thrombus - echocardiogram in 04/2019 showed a 1.0 x 0.92 cm apical thrombus that was semi mobile. A repeat echocardiogram has been ordered for this admission. Remains on Coumadin for anticoagulation.   3. Persistent Atrial Fibrillation - on 06/25/2019, Dr.  Harl Bowie started Amiodarone with a loading dose of 463m BID for 1 week, 2042mBID for 2 weeks then 200 mg daily. He has converted to NSR. Will adjust inpatient dosing to previously recommended dose of 20045mID for 2 weeks then 200m12mily.  - currently on Coumadin for anticoagulation and INR supratherapeutic at 5.5 on admission. Being followed by Pharmacy. If his apical thrombus has resolved by repeat echocardiogram, would switch to a DOAC.   4. NSVT - noted on telemetry with longest episode being 19 beats.  - Mg 1.4 this AM. K+ 3.4. Keep Mg ~ 2.0 and K+ ~ 4.0. Will order supplementation. PTA Toprol-XL currently held given hypotension and his acute CHF exacerbation. Remains on PO Amiodarone.   5. Elevated LFT's - AST 117 and ALT 617 on admission, improved to 64 and 448 today. Suspect secondary to hepatic congestion. Continue to follow with diuresis.   6. Stage 3 CKD - baseline creatinine ~ 1.6. Had peaked at 3.77 in the outpatient setting, improved to 1.28 today with diuresis.    For questions or updates, please contact [facility] consult [email or web address] for contact info under Cardiology/STEMI.  Signed, BritErma Heritage-C 07/03/2019, 9:38 AM Pager: [phone] examined chart reviewed. Discussed care with PA/patient Echo tech in room Exam with volume overload. JVP up basilar rales distant heart sounds plus 2 edema palpable pedal pulses. He is in NSR. Not clear that elevated Cr > 3 was not a lab error. Continue amiodarone and coumadin for now Converted to NSR Review echo for EF and ? Apical thrombus  PeteJenkins RougeFACCSsm Health St[facility]

## 2019-07-03 NOTE — Progress Notes (Signed)
PROGRESS NOTE    LUM STILLINGER  LFY:[id number] DOB: 06-17-1955 DOA: 07/02/2019 PCP: Lucia Gaskins, MD   Chief Complaint  Patient presents with  .  Acute on chronic systolic CHF exacerbation, fluid overload and with positive orthopnea and dyspnea.    Brief Narrative:  As per H&P written by Dr. Manuella Ghazi on 07/02/2019 64 y.o. male with medical history significant for systolic congestive heart failure with LVEF 30-35% on echocardiogram in 2015, LV thrombus currently on Coumadin, ongoing tobacco abuse, history of atrial fibrillation, hypertension, and type 2 diabetes with diabetic neuropathy who presented to the ED after being seen in the cardiology clinic earlier today by his cardiologist Dr. Harl Bowie.  He was noted to be volume overloaded with an approximately 13 pound weight gain since 04/2019.  He did not appear to tolerate increasing doses of torsemide at home and was noted to have severe AKI with creatinine up to 3.77 for which torsemide was held.  Since he was noted to have difficulty with diuresis in the outpatient setting, he was referred to the ED for admission for inpatient diuresis and close monitoring as well as repeat 2D echocardiogram.  He denies any chest pain or shortness of breath.  No orthopnea or paroxysmal nocturnal dyspnea noted.  No fevers, or chills, or cough.   ED Course: Stable vital signs noted.  And creatinine currently 1.73 which is near his baseline.  He is noted to have some mild transaminitis with AST 117 and ALT 617.  BNP is 2735.  Two-view chest x-ray with some cardiomegaly and right-sided pleural effusion and interstitial edema noted.  Covid testing negative.  IV Lasix has been ordered and pending.   Assessment & Plan: 1-Acute on chronic systolic (congestive) heart failure ([place]) -last Ejection fraction 30-35% -2D echo repeated (pending) -good urine output appreciated -continue current IV diuresis -follow renal function and electrolytes -follow daily weights and  strict I's and O's -long discussion about low sodium diet  -follow cardiology service recommendations and resume heart failure drugs as indicated.  2-hx of paroxysmal atrial fibrillation -stable and with rate controlled -currently sinus -continue amiodarone and coumadin  3-HTN -soft on admission -stable currently -continue IV lasix -follow rec's by cardiology for further heart meds resumption  4-LV thrombus -continue coumadin   5-acute on chronic renal failure (stage 3a)  -continue avoiding hypotension and minimizing nephrotoxic agents -follow renal function trend -follow cardiology service rec's for recommended diuretics.  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 Disposition:   Status is: Inpatient  Dispo: The patient is from: Home              Anticipated d/c is to: Home              Anticipated d/c date is: 6/16--6/17, based on further improvement/stability of his heart failure exacerbation.              Patient currently not medically stable for discharge, still short winded with activity and having some orthopnea.  Fluid overload on examination.  Feeling better and with good ongoing diuresis with current treatment.  Follow cardiology service recommendations.  No chest pain or palpitations.  Consultants:   Cardiology service.   Procedures:  See below for x-ray reports.   Antimicrobials:  None   Subjective: Afebrile, no chest pain, no palpitations.  Still winded with minimal exertion and having difficulty laying flat  on bed (positive orthopnea).  Objective: Vitals:   07/03/19 0018 07/03/19 0429 07/03/19 0600 07/03/19 1058  BP: 90/60 101/77  104/76  Pulse: 63 75  68  Resp: 20 20  17   Temp: 97.7 F (36.5 C) 98.1 F (36.7 C)  98 F (36.7 C)  TempSrc: Oral Oral  Oral  SpO2: 99% (!) 89%  97%  Weight:   99 kg   Height:        Intake/Output Summary (Last 24 hours) at 07/03/2019 1146 Last data filed at 07/03/2019 0900 Gross per 24 hour  Intake 360 ml  Output 2050 ml  Net -1690 ml   Filed Weights   07/02/19 1105 07/02/19 1702 07/03/19 0600  Weight: 102.1 kg 100.2 kg 99 kg    Examination: General exam: Appears calm and denies chest pain or palpitations currently.  Still with fluid overload on examination (2+ edema bilaterally up to his thighs and positive JVD), easily winded with exertion and not able to laying flat on bed.  Respiratory system: Decreased breath sounds at the bases, no wheezing, no using accessory muscles.  Cardiovascular system: Rate controlled; no S3, S4 or murmurs.  Sinus rhythm on telemetry evaluation.  Gastrointestinal system: Abdomen is nondistended, soft and nontender. No organomegaly or masses felt. Normal bowel sounds heard. Central nervous system: Alert and oriented. No focal neurological deficits. Extremities: 2++ edema bilaterally, no cyanosis, no clubbing. Skin: No rashes, no petechiae. Psychiatry: Mood & affect appropriate.     Data Reviewed: I have personally reviewed following labs and imaging studies  CBC: Recent Labs  Lab 07/02/19 1130 07/03/19 0614  WBC 6.5 5.4  NEUTROABS 4.4 3.3  HGB 12.4* 11.8*  HCT 38.0* 36.7*  MCV 103.3* 103.4*  PLT 198 947    Basic Metabolic Panel: Recent Labs  Lab 06/29/19 1556 07/02/19 1130 07/03/19 0614  NA 136 139 142  K 3.7 4.0 3.4*  CL 97* 101 103  CO2 23 25 29   GLUCOSE 151* 168* 107*  BUN 76* 41* 32*  CREATININE 3.77* 1.73* 1.28*  CALCIUM 7.3* 7.4* 7.4*  MG  --   --  1.4*    GFR: Estimated Creatinine  Clearance: 73 mL/min (A) (by C-G formula based on SCr of 1.28 mg/dL (H)).  Liver Function Tests: Recent Labs  Lab 07/02/19 1130 07/03/19 0614  AST 117* 64*  ALT 617* 448*  ALKPHOS 100 82  BILITOT 0.4 1.3*  PROT 6.7 6.2*  ALBUMIN 3.5 3.1*    CBG: Recent Labs  Lab 07/02/19 1559 07/02/19 2037 07/03/19 0758 07/03/19 1058  GLUCAP 103* 142* 90 259*     Recent Results (from the past 240 hour(s))  SARS Coronavirus 2 by RT PCR (hospital order, performed in [facility] hospital lab) Nasopharyngeal Nasopharyngeal Swab     Status: None   Collection Time: 07/02/19 11:57 AM   Specimen: Nasopharyngeal Swab  Result Value Ref Range Status   SARS Coronavirus 2 NEGATIVE NEGATIVE Final    Comment: (NOTE) SARS-CoV-2 target nucleic acids are NOT DETECTED.  The SARS-CoV-2 RNA is generally detectable in upper and lower respiratory specimens during the acute phase of infection. The lowest concentration of SARS-CoV-2 viral copies this assay can detect is 250 copies / mL. A negative result does not preclude SARS-CoV-2 infection and  should not be used as the sole basis for treatment or other patient management decisions.  A negative result may occur with improper specimen collection / handling, submission of specimen other than nasopharyngeal swab, presence of viral mutation(s) within the areas targeted by this assay, and inadequate number of viral copies (<250 copies / mL). A negative result must be combined with clinical observations, patient history, and epidemiological information.  Fact Sheet for Patients:   StrictlyIdeas.no  Fact Sheet for Healthcare Providers: BankingDealers.co.za  This test is not yet approved or  cleared by the [place] FDA and has been authorized for detection and/or diagnosis of SARS-CoV-2 by FDA under an Emergency Use Authorization (EUA).  This EUA will remain in effect (meaning this test can be used) for the  duration of the COVID-19 declaration under Section 564(b)(1) of the Act, 21 U.S.C. section 360bbb-3(b)(1), unless the authorization is terminated or revoked sooner.  Performed at [facility], [address] [place], [place] [address]      Radiology Studies: DG Chest 2 View  Result Date: 07/02/2019 CLINICAL DATA:  64 year old male with lower extremity swelling for 3 weeks. Cough. Query edema. EXAM: CHEST - 2 VIEW COMPARISON:  Portable chest 04/23/2019 and earlier. FINDINGS: Cardiomegaly again suspected with stable cardiac silhouette. Normal lung volumes. Patchy right lung base opacity which on the lateral view is suspicious for pleural effusion partially tracking into the major fissure. Small volume of minor fissure fluid also. Pulmonary vascularity is increased compared to 2014 radiographs. No left pleural effusion, pneumothorax, or other confluent opacity. Visualized tracheal air column is within normal limits. No acute osseous abnormality identified. Negative visible bowel gas pattern. IMPRESSION: 1. Cardiomegaly with right lung base opacity favored due to pleural effusion partially tracking into the fissures. 2. Increased pulmonary vascularity compatible with mild or developing interstitial edema. Electronically Signed   By: Genevie Ann M.D.   On: 07/02/2019 12:12   ECHOCARDIOGRAM COMPLETE  Result Date: 07/03/2019    ECHOCARDIOGRAM REPORT   Patient Name:   MALAKI KOURY Vanatta Date of Exam: 07/03/2019 Medical Rec #:  [id number]        Height:       72.0 in Accession #:    [id number]       Weight:       218.3 lb Date of Birth:  07-19-1955       BSA:          2.211 m Patient Age:    27 years         BP:           101/77 mmHg Patient Gender: M                HR:           75 bpm. Exam Location:  [facility] Procedure: 2D Echo Indications:    Cardiomyopathy-Unspecified 425.9 / I42.9  History:        Patient has prior history of Echocardiogram examinations, most                 recent 04/24/2019. CHF,  Arrythmias:Atrial Fibrillation; Risk                 Factors:Hypertension, Diabetes, Current Smoker and Dyslipidemia.                 Thrombus in heart chamber, Chronic renal failure.  Sonographer:    Leavy Cella RDCS (AE) Referring Phys: [phone] Royanne Foots [place]  1. No LV thrombus noted .  Left ventricular ejection fraction, by estimation, is 25 to 30%. The left ventricle has severely decreased function. The left ventricle demonstrates global hypokinesis. The left ventricular internal cavity size was moderately dilated. There is mild left ventricular hypertrophy. Left ventricular diastolic parameters are consistent with Grade II diastolic dysfunction (pseudonormalization). Elevated left ventricular end-diastolic pressure.  2. Right ventricular systolic function is moderately reduced. The right ventricular size is mildly enlarged. There is moderately elevated pulmonary artery systolic pressure.  3. Left atrial size was moderately dilated.  4. Right atrial size was mildly dilated.  5. The mitral valve is normal in structure. No evidence of mitral valve regurgitation. No evidence of mitral stenosis.  6. The aortic valve is tricuspid. Aortic valve regurgitation is not visualized. No aortic stenosis is present.  7. The inferior vena cava is dilated in size with >50% respiratory variability, suggesting right atrial pressure of 8 mmHg. FINDINGS  Left Ventricle: No LV thrombus noted. Left ventricular ejection fraction, by estimation, is 25 to 30%. The left ventricle has severely decreased function. The left ventricle demonstrates global hypokinesis. The left ventricular internal cavity size was moderately dilated. There is mild left ventricular hypertrophy. Left ventricular diastolic parameters are consistent with Grade II diastolic dysfunction (pseudonormalization). Elevated left ventricular end-diastolic pressure. Right Ventricle: The right ventricular size is mildly enlarged. Right vetricular wall thickness  was not assessed. Right ventricular systolic function is moderately reduced. There is moderately elevated pulmonary artery systolic pressure. The tricuspid regurgitant velocity is 3.31 m/s, and with an assumed right atrial pressure of 10 mmHg, the estimated right ventricular systolic pressure is 81.1 mmHg. Left Atrium: Left atrial size was moderately dilated. Right Atrium: Right atrial size was mildly dilated. Pericardium: There is no evidence of pericardial effusion. Mitral Valve: The mitral valve is normal in structure. Normal mobility of the mitral valve leaflets. No evidence of mitral valve regurgitation. No evidence of mitral valve stenosis. Tricuspid Valve: The tricuspid valve is normal in structure. Tricuspid valve regurgitation is mild . No evidence of tricuspid stenosis. Aortic Valve: The aortic valve is tricuspid. Aortic valve regurgitation is not visualized. No aortic stenosis is present. Pulmonic Valve: The pulmonic valve was normal in structure. Pulmonic valve regurgitation is mild. No evidence of pulmonic stenosis. Aorta: The aortic root is normal in size and structure. Venous: The inferior vena cava is dilated in size with greater than 50% respiratory variability, suggesting right atrial pressure of 8 mmHg. IAS/Shunts: No atrial level shunt detected by color flow Doppler.  LEFT VENTRICLE PLAX 2D LVIDd:         5.43 cm  Diastology LVIDs:         4.63 cm  LV e' lateral:   4.80 cm/s LV PW:         1.29 cm  LV E/e' lateral: 22.7 LV IVS:        1.40 cm  LV e' medial:    2.40 cm/s LVOT diam:     1.90 cm  LV E/e' medial:  45.4 LVOT Area:     2.84 cm  RIGHT VENTRICLE RV S prime:     9.45 cm/s TAPSE (M-mode): 1.9 cm LEFT ATRIUM              Index       RIGHT ATRIUM           Index LA diam:        4.40 cm  1.99 cm/m  RA Area:     23.10 cm LA Vol (A2C):  100.0 ml 45.23 ml/m RA Volume:   82.40 ml  37.27 ml/m LA Vol (A4C):   75.1 ml  33.96 ml/m LA Biplane Vol: 87.7 ml  39.66 ml/m   AORTA Ao Root diam: 2.80  cm MITRAL VALVE                TRICUSPID VALVE MV Area (PHT): 4.80 cm     TR Peak grad:   43.8 mmHg MV Decel Time: 158 msec     TR Vmax:        331.00 cm/s MR Peak grad: 46.5 mmHg MR Mean grad: 32.0 mmHg     SHUNTS MR Vmax:      341.00 cm/s   Systemic Diam: 1.90 cm MR Vmean:     269.0 cm/s MV E velocity: 109.00 cm/s MV A velocity: 23.80 cm/s MV E/A ratio:  4.58 Jenkins Rouge MD Electronically signed by Jenkins Rouge MD Signature Date/Time: 07/03/2019/11:41:20 AM    Final     Scheduled Meds: . amiodarone  200 mg Oral BID  . furosemide  80 mg Intravenous Q12H  . gabapentin  200 mg Oral TID  . insulin aspart  0-15 Units Subcutaneous TID WC  . insulin aspart  0-5 Units Subcutaneous QHS  . nicotine  7 mg Transdermal Daily  . potassium chloride SA  40 mEq Oral Daily  . sodium chloride flush  3 mL Intravenous Q12H   Continuous Infusions: . sodium chloride       LOS: 1 day    Time spent: 30 minutes.   Barton Dubois, MD [facility]   To contact the attending provider between 7A-7P or the covering provider during after hours 7P-7A, please log into the web site [email or web address] and access using universal [place] password for that web site. If you do not have the password, please call the hospital operator.  07/03/2019, 11:46 AM

## 2019-07-03 NOTE — Progress Notes (Signed)
ANTICOAGULATION CONSULT NOTE -  Pharmacy Consult for Coumadin Indication: atrial fibrillation/LV thrombu  No Known Allergies  Patient Measurements: Height: 6' (182.9 cm) Weight: 99 kg (218 lb 4.1 oz) IBW/kg (Calculated) : 77.6  Vital Signs: Temp: 98.1 F (36.7 C) (06/15 0429) Temp Source: Oral (06/15 0429) BP: 101/77 (06/15 0429) Pulse Rate: 75 (06/15 0429)  Labs: Recent Labs    07/02/19 1130 07/03/19 0614 07/03/19 0752  HGB 12.4* 11.8*  --   HCT 38.0* 36.7*  --   PLT 198 182  --   LABPROT 48.2*  --  29.9*  INR 5.5*  --  3.0*  CREATININE 1.73* 1.28*  --     Estimated Creatinine Clearance: 73 mL/min (A) (by C-G formula based on SCr of 1.28 mg/dL (H)).   Medical History: Past Medical History:  Diagnosis Date  . Anxiety   . CHF (congestive heart failure) ([place])    a. EF 15% in 2013 with cath showing normal cors b. EF 30-35% by repeat echo in 2015  . Chronic systolic heart failure ([place])   . Diabetes mellitus   . Fluid retention in legs   . Hypertension     Medications:  Medications Prior to Admission  Medication Sig Dispense Refill Last Dose  . amiodarone (PACERONE) 200 MG tablet TAKE 400 MG TWICE DAILY FOR 1 WEEK THEN 200 MG TWICE DAILY FOR 2 WEEKS THEN TAKE 200 MG DAILY 180 tablet 1 07/01/2019 at Unknown time  . gabapentin (NEURONTIN) 100 MG capsule Take 2 capsules (200 mg total) by mouth 3 (three) times daily. 180 capsule 1 07/01/2019 at Unknown time  . metFORMIN (GLUCOPHAGE) 500 MG tablet Take 500 mg by mouth 2 (two) times daily.   07/01/2019 at Unknown time  . metoprolol succinate (TOPROL-XL) 25 MG 24 hr tablet Take 1.5 tablets (37.5 mg total) by mouth daily. 135 tablet 3 07/01/2019 at Unknown time  . Oxycodone HCl 10 MG TABS Take 10 mg by mouth 4 (four) times daily as needed.   07/01/2019 at Unknown time  . potassium chloride SA (KLOR-CON) 20 MEQ tablet Take 2 tablets (40 mEq total) by mouth daily. 180 tablet 3 07/01/2019 at Unknown time  . simvastatin (ZOCOR) 40  MG tablet Take 1 tablet (40 mg total) by mouth at bedtime. 90 tablet 1 07/01/2019 at Unknown time  . warfarin (COUMADIN) 2.5 MG tablet Take 3 tablets (7.5 mg total) by mouth daily at 4 PM. 90 tablet 1 07/01/2019 at Unknown time  . losartan (COZAAR) 25 MG tablet Take 0.5 tablets (12.5 mg total) by mouth daily. (Patient not taking: Reported on 07/02/2019) 45 tablet 3 Not Taking at Unknown time  . torsemide (DEMADEX) 20 MG tablet Take 2 tablets (40 mg total) by mouth daily. (Patient not taking: Reported on 07/02/2019) 90 tablet 1 Not Taking at Unknown time    Assessment: 64 y.o. male with medical history significant for systolic congestive heart failure with LVEF 30-35% on echocardiogram in 2015, LV thrombus currently on Coumadin, ongoing tobacco abuse, history of atrial fibrillation, hypertension, and type 2 diabetes with diabetic neuropathy. Presented to the ED with volume overload, 14 lb weight gain. Pharmacy asked to dose coumadin. INR supratherapeutic at 5.5.  INR is 3.0 today, will hold 1 more day.  Home dose is 3.75mg  on M,W,F and 5mg  on T, Th, Sat, Sun  Goal of Therapy:  INR 2-3 Monitor platelets by anticoagulation protocol: Yes   Plan:  No coumadin today Daily PT-INR Monitor for S/S of bleeding  Karrington Studnicka  Trenton Gammon, BS [place]ena [place], BCPS Clinical Pharmacist Pager [phone] 07/03/2019,10:27 AM

## 2019-07-03 NOTE — Plan of Care (Signed)
  Problem: Education: Goal: Knowledge of General Education information will improve Description: Including pain rating scale, medication(s)/side effects and non-pharmacologic comfort measures Outcome: Progressing   Problem: Clinical Measurements: Goal: Will remain free from infection Outcome: Progressing   

## 2019-07-04 LAB — BASIC METABOLIC PANEL
Anion gap: 14 (ref 5–15)
BUN: 24 mg/dL — ABNORMAL HIGH (ref 8–23)
CO2: 27 mmol/L (ref 22–32)
Calcium: 8.2 mg/dL — ABNORMAL LOW (ref 8.9–10.3)
Chloride: 101 mmol/L (ref 98–111)
Creatinine, Ser: 1.17 mg/dL (ref 0.61–1.24)
GFR calc Af Amer: >60 mL/min (ref >60)
GFR calc non Af Amer: >60 mL/min (ref >60)
Glucose, Bld: 108 mg/dL — ABNORMAL HIGH (ref 70–99)
Potassium: 3.4 mmol/L — ABNORMAL LOW (ref 3.5–5.1)
Sodium: 142 mmol/L (ref 135–145)

## 2019-07-04 LAB — CBC
HCT: 39 % (ref 39.0–52.0)
Hemoglobin: 12.6 g/dL — ABNORMAL LOW (ref 13.0–17.0)
MCH: 33.9 pg (ref 26.0–34.0)
MCHC: 32.3 g/dL (ref 30.0–36.0)
MCV: 104.8 fL — ABNORMAL HIGH (ref 80.0–100.0)
Platelets: 209 10*3/uL (ref 150–400)
RBC: 3.72 MIL/uL — ABNORMAL LOW (ref 4.22–5.81)
RDW: 14.9 % (ref 11.5–15.5)
WBC: 6.5 10*3/uL (ref 4.0–10.5)
nRBC: 0 % (ref 0.0–0.2)

## 2019-07-04 LAB — GLUCOSE, CAPILLARY
Glucose-Capillary: 111 mg/dL — ABNORMAL HIGH (ref 70–99)
Glucose-Capillary: 231 mg/dL — ABNORMAL HIGH (ref 70–99)
Glucose-Capillary: 117 mg/dL — ABNORMAL HIGH (ref 70–99)
Glucose-Capillary: 146 mg/dL — ABNORMAL HIGH (ref 70–99)

## 2019-07-04 LAB — MAGNESIUM: Magnesium: 1.7 mg/dL (ref 1.7–2.4)

## 2019-07-04 LAB — PROTIME-INR
INR: 2.3 — ABNORMAL HIGH (ref 0.8–1.2)
Prothrombin Time: 24.2 seconds — ABNORMAL HIGH (ref 11.4–15.2)

## 2019-07-04 MED ORDER — WARFARIN SODIUM 2.5 MG PO TABS
3.7500 mg | ORAL_TABLET | Freq: Once | ORAL | Status: DC
  Filled 2019-07-04: qty 1.5

## 2019-07-04 MED ORDER — METOPROLOL SUCCINATE ER 25 MG PO TB24
12.5000 mg | ORAL_TABLET | Freq: Every day | ORAL | Status: DC
  Administered 2019-07-04: 12.5 mg via ORAL
  Filled 2019-07-04 (×2): qty 1

## 2019-07-04 MED ORDER — WARFARIN - PHARMACIST DOSING INPATIENT: Freq: Every day | Status: DC

## 2019-07-04 NOTE — Discharge Instructions (Signed)

## 2019-07-04 NOTE — Progress Notes (Signed)
Mid level notified of 30 beats of v tach per central tele. No new orders at this time. Pt alert and verbal with no distress noted. Call bell in reach.

## 2019-07-04 NOTE — Plan of Care (Signed)

## 2019-07-04 NOTE — Progress Notes (Signed)
PROGRESS NOTE    [name] [name]  RCV:[id number] DOB: 02-22-1955 DOA: 07/02/2019 PCP: Lucia Gaskins, MD   Brief Narrative:  As per H&P written by Dr. Manuella Ghazi on 07/02/2019 64 y.o.malewith medical history significant forsystolic congestive heart failure with LVEF 30-35% on echocardiogram in 2015, LV thrombus currently on Coumadin, ongoing tobacco abuse, history of atrial fibrillation, hypertension, and type 2 diabetes with diabetic neuropathy who presented to the ED after being seen in the cardiology clinic earlier today by his cardiologist Dr. Harl Bowie. He was noted to be volume overloaded with an approximately 13 pound weight gain since 04/2019. He did not appear to tolerate increasing doses of torsemide at home and was noted to have severe AKI with creatinine up to 3.77 for which torsemide was held. Since he was noted to have difficulty with diuresis in the outpatient setting, he was referred to the ED for admission for inpatient diuresis and close monitoring as well as repeat 2D echocardiogram.He denies any chest pain or shortness of breath. No orthopnea or paroxysmal nocturnal dyspnea noted. No fevers,or chills, or cough.  ED Course:Stable vital signs noted. And creatinine currently 1.73 which is near his baseline. He is noted to have some mild transaminitis with AST 117 and ALT 617. BNP is 2735. Two-view chest x-ray with some cardiomegaly and right-sided pleural effusion and interstitial edema noted. Covid testing negative. IV Lasix has been ordered and pending.   Assessment & Plan:   Active Problems:   Acute on chronic systolic (congestive) heart failure (HCC)   1-Acute on chronic systolic (congestive) heart failure (HCC) -last Ejection fraction 30-35% -2D echo repeated (pending) -good urine output appreciated -continue current IV diuresis as ordered -follow renal function and electrolytes -follow daily weights and strict I's and O's -long discussion about low  sodium diet  -follow cardiology service recommendations and resume heart failure drugs as indicated -May need Entresto on DC  2-hx of paroxysmal atrial fibrillation -stable and with rate controlled -currently sinus -continue amiodarone -Coumadin to DC and will be started on Eliquis  3-HTN -soft on admission, but improved -metoprolol resumed 6/16 -stable currently -continue IV lasix -follow rec's by cardiology for further heart meds resumption  4-LV thrombus -Not present on echo this admission -Coumadin DC and Eliquis started  5-acute on chronic renal failure (stage 3a)  -continue avoiding hypotension and minimizing nephrotoxic agents -follow renal function trend -follow cardiology service rec's for recommended diuretics.  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 Lovena Le EP -Further invasive vs noninvasive imaging per Cardiology   DVT prophylaxis: chronically on coumadin  Code Status: Full code Family Communication: No family at bedside. Disposition:   Status is: Inpatient  Dispo: The patient is from: Home  Anticipated d/c is to: Home  Anticipated d/c date is: 2-3 days based on further improvement/stability of his heart failure exacerbation.  Patient currently not medically stable for discharge, still short winded with activity and having some orthopnea.  Fluid overload on examination.  Feeling better and  with good ongoing diuresis with current treatment.  Follow cardiology service recommendations.  No chest pain or  palpitations.  Consultants:   Cardiology service.   Procedures:  See below for x-ray reports.   Antimicrobials:  None   Subjective: Patient seen and evaluated today with no new acute complaints or concerns.  He was noted to have nonsustained V. tach overnight, but with no chest pain.  He appears to have had some dizziness from the time of the episode.  He still has ongoing edema.  Objective: Vitals:   07/03/19 2113 07/04/19 0509 07/04/19 0600 07/04/19 1427  BP:  109/76  120/88  Pulse:  84  80  Resp:  20    Temp:  98.3 F (36.8 C)  97.8 F (36.6 C)  TempSrc:  Oral  Oral  SpO2: 95% 93%  99%  Weight:   96.9 kg   Height:        Intake/Output Summary (Last 24 hours) at 07/04/2019 1537 Last data filed at 07/04/2019 0900 Gross per 24 hour  Intake 720 ml  Output 3050 ml  Net -2330 ml   Filed Weights   07/02/19 1702 07/03/19 0600 07/04/19 0600  Weight: 100.2 kg 99 kg 96.9 kg    Examination:  General exam: Appears calm and comfortable  Respiratory system: Clear to auscultation. Respiratory effort normal. Cardiovascular system: S1 & S2 heard, RRR. No JVD, murmurs, rubs, gallops or clicks. 1+ bilateral edema noted. Gastrointestinal system: Abdomen is nondistended, soft and nontender. No organomegaly or masses felt. Normal bowel sounds heard. Central nervous system: Alert and oriented. No focal neurological deficits. Extremities: Symmetric 5 x 5 power. Skin: No rashes, lesions or ulcers Psychiatry: Judgement and insight appear normal. Mood & affect appropriate.     Data Reviewed: I have personally reviewed following labs and imaging studies  CBC: Recent Labs  Lab 07/02/19 1130 07/03/19 0614 07/04/19 0534  WBC 6.5 5.4 6.5  NEUTROABS 4.4 3.3  --   HGB 12.4* 11.8* 12.6*  HCT 38.0* 36.7* 39.0  MCV 103.3* 103.4* 104.8*  PLT 198 182 756   Basic  Metabolic Panel: Recent Labs  Lab 06/29/19 1556 07/02/19 1130 07/03/19 0614 07/04/19 0744  NA 136 139 142 142  K 3.7 4.0 3.4* 3.4*  CL 97* 101 103 101  CO2 23 25 29 27   GLUCOSE 151* 168* 107* 108*  BUN 76* 41* 32* 24*  CREATININE 3.77* 1.73* 1.28* 1.17  CALCIUM 7.3* 7.4* 7.4* 8.2*  MG  --   --  1.4* 1.7   GFR: Estimated Creatinine Clearance: 79 mL/min (by C-G formula based on SCr of 1.17 mg/dL). Liver Function Tests: Recent Labs  Lab 07/02/19 1130 07/03/19 0614  AST 117* 64*  ALT 617* 448*  ALKPHOS 100 82  BILITOT 0.4 1.3*  PROT 6.7 6.2*  ALBUMIN 3.5 3.1*   No results for input(s): LIPASE, AMYLASE in the last 168 hours. No results for input(s): AMMONIA in the last 168 hours. Coagulation Profile: Recent Labs  Lab 07/02/19 1130 07/03/19 0752 07/04/19 0534  INR 5.5* 3.0* 2.3*   Cardiac Enzymes: No results for input(s): CKTOTAL, CKMB, CKMBINDEX, TROPONINI in the last 168 hours. BNP (last 3 results) No results for input(s): PROBNP in the last 8760 hours. HbA1C: Recent Labs    07/02/19 1130  HGBA1C 7.9*   CBG: Recent Labs  Lab 07/03/19 1058 07/03/19 1743 07/03/19 2056 07/04/19 0730 07/04/19 1110  GLUCAP 259* 86 128* 111* 231*   Lipid Profile: No results for input(s): CHOL, HDL, LDLCALC, TRIG, CHOLHDL, LDLDIRECT in the last 72 hours. Thyroid Function Tests: No results for input(s): TSH, T4TOTAL,  FREET4, T3FREE, THYROIDAB in the last 72 hours. Anemia Panel: No results for input(s): VITAMINB12, FOLATE, FERRITIN, TIBC, IRON, RETICCTPCT in the last 72 hours. Sepsis Labs: No results for input(s): PROCALCITON, LATICACIDVEN in the last 168 hours.  Recent Results (from the past 240 hour(s))  SARS Coronavirus 2 by RT PCR (hospital order, performed in [facility] hospital lab) Nasopharyngeal Nasopharyngeal Swab     Status: None   Collection Time: 07/02/19 11:57 AM   Specimen: Nasopharyngeal Swab  Result Value Ref Range Status   SARS Coronavirus 2 NEGATIVE  NEGATIVE Final    Comment: (NOTE) SARS-CoV-2 target nucleic acids are NOT DETECTED.  The SARS-CoV-2 RNA is generally detectable in upper and lower respiratory specimens during the acute phase of infection. The lowest concentration of SARS-CoV-2 viral copies this assay can detect is 250 copies / mL. A negative result does not preclude SARS-CoV-2 infection and should not be used as the sole basis for treatment or other patient management decisions.  A negative result may occur with improper specimen collection / handling, submission of specimen other than nasopharyngeal swab, presence of viral mutation(s) within the areas targeted by this assay, and inadequate number of viral copies (<250 copies / mL). A negative result must be combined with clinical observations, patient history, and epidemiological information.  Fact Sheet for Patients:   StrictlyIdeas.no  Fact Sheet for Healthcare Providers: BankingDealers.co.za  This test is not yet approved or  cleared by the [place] FDA and has been authorized for detection and/or diagnosis of SARS-CoV-2 by FDA under an Emergency Use Authorization (EUA).  This EUA will remain in effect (meaning this test can be used) for the duration of the COVID-19 declaration under Section 564(b)(1) of the Act, 21 U.S.C. section 360bbb-3(b)(1), unless the authorization is terminated or revoked sooner.  Performed at [facility], [address], [place], [place] [address]          Radiology Studies: ECHOCARDIOGRAM COMPLETE  Result Date: 07/03/2019    ECHOCARDIOGRAM REPORT   Patient Name:   KINTA MARTIS Funderburk Date of Exam: 07/03/2019 Medical Rec #:  [id number]        Height:       72.0 in Accession #:    [id number]       Weight:       218.3 lb Date of Birth:  [birth year]       BSA:          2.211 m Patient Age:    59 years         BP:           101/77 mmHg Patient Gender: M                HR:           75  bpm. Exam Location:  [facility] Procedure: 2D Echo Indications:    Cardiomyopathy-Unspecified 425.9 / I42.9  History:        Patient has prior history of Echocardiogram examinations, most                 recent 04/24/2019. CHF, Arrythmias:Atrial Fibrillation; Risk                 Factors:Hypertension, Diabetes, Current Smoker and Dyslipidemia.                 Thrombus in heart chamber, Chronic renal failure.  Sonographer:    Leavy Cella RDCS (AE) Referring Phys: [phone] Royanne Foots [place]  1. No LV  thrombus noted . Left ventricular ejection fraction, by estimation, is 25 to 30%. The left ventricle has severely decreased function. The left ventricle demonstrates global hypokinesis. The left ventricular internal cavity size was moderately dilated. There is mild left ventricular hypertrophy. Left ventricular diastolic parameters are consistent with Grade II diastolic dysfunction (pseudonormalization). Elevated left ventricular end-diastolic pressure.  2. Right ventricular systolic function is moderately reduced. The right ventricular size is mildly enlarged. There is moderately elevated pulmonary artery systolic pressure.  3. Left atrial size was moderately dilated.  4. Right atrial size was mildly dilated.  5. The mitral valve is normal in structure. No evidence of mitral valve regurgitation. No evidence of mitral stenosis.  6. The aortic valve is tricuspid. Aortic valve regurgitation is not visualized. No aortic stenosis is present.  7. The inferior vena cava is dilated in size with >50% respiratory variability, suggesting right atrial pressure of 8 mmHg. FINDINGS  Left Ventricle: No LV thrombus noted. Left ventricular ejection fraction, by estimation, is 25 to 30%. The left ventricle has severely decreased function. The left ventricle demonstrates global hypokinesis. The left ventricular internal cavity size was moderately dilated. There is mild left ventricular hypertrophy. Left ventricular diastolic  parameters are consistent with Grade II diastolic dysfunction (pseudonormalization). Elevated left ventricular end-diastolic pressure. Right Ventricle: The right ventricular size is mildly enlarged. Right vetricular wall thickness was not assessed. Right ventricular systolic function is moderately reduced. There is moderately elevated pulmonary artery systolic pressure. The tricuspid regurgitant velocity is 3.31 m/s, and with an assumed right atrial pressure of 10 mmHg, the estimated right ventricular systolic pressure is 57.3 mmHg. Left Atrium: Left atrial size was moderately dilated. Right Atrium: Right atrial size was mildly dilated. Pericardium: There is no evidence of pericardial effusion. Mitral Valve: The mitral valve is normal in structure. Normal mobility of the mitral valve leaflets. No evidence of mitral valve regurgitation. No evidence of mitral valve stenosis. Tricuspid Valve: The tricuspid valve is normal in structure. Tricuspid valve regurgitation is mild . No evidence of tricuspid stenosis. Aortic Valve: The aortic valve is tricuspid. Aortic valve regurgitation is not visualized. No aortic stenosis is present. Pulmonic Valve: The pulmonic valve was normal in structure. Pulmonic valve regurgitation is mild. No evidence of pulmonic stenosis. Aorta: The aortic root is normal in size and structure. Venous: The inferior vena cava is dilated in size with greater than 50% respiratory variability, suggesting right atrial pressure of 8 mmHg. IAS/Shunts: No atrial level shunt detected by color flow Doppler.  LEFT VENTRICLE PLAX 2D LVIDd:         5.43 cm  Diastology LVIDs:         4.63 cm  LV e' lateral:   4.80 cm/s LV PW:         1.29 cm  LV E/e' lateral: 22.7 LV IVS:        1.40 cm  LV e' medial:    2.40 cm/s LVOT diam:     1.90 cm  LV E/e' medial:  45.4 LVOT Area:     2.84 cm  RIGHT VENTRICLE RV S prime:     9.45 cm/s TAPSE (M-mode): 1.9 cm LEFT ATRIUM              Index       RIGHT ATRIUM           Index  LA diam:        4.40 cm  1.99 cm/m  RA Area:     23.10 cm  LA Vol (A2C):   100.0 ml 45.23 ml/m RA Volume:   82.40 ml  37.27 ml/m LA Vol (A4C):   75.1 ml  33.96 ml/m LA Biplane Vol: 87.7 ml  39.66 ml/m   AORTA Ao Root diam: 2.80 cm MITRAL VALVE                TRICUSPID VALVE MV Area (PHT): 4.80 cm     TR Peak grad:   43.8 mmHg MV Decel Time: 158 msec     TR Vmax:        331.00 cm/s MR Peak grad: 46.5 mmHg MR Mean grad: 32.0 mmHg     SHUNTS MR Vmax:      341.00 cm/s   Systemic Diam: 1.90 cm MR Vmean:     269.0 cm/s MV E velocity: 109.00 cm/s MV A velocity: 23.80 cm/s MV E/A ratio:  4.58 Jenkins Rouge MD Electronically signed by Jenkins Rouge MD Signature Date/Time: 07/03/2019/11:41:20 AM    Final         Scheduled Meds: . amiodarone  200 mg Oral BID  . furosemide  80 mg Intravenous Q12H  . gabapentin  200 mg Oral TID  . insulin aspart  0-15 Units Subcutaneous TID WC  . insulin aspart  0-5 Units Subcutaneous QHS  . metoprolol succinate  12.5 mg Oral Daily  . nicotine  7 mg Transdermal Daily  . potassium chloride SA  40 mEq Oral Daily  . sodium chloride flush  3 mL Intravenous Q12H   Continuous Infusions: . sodium chloride       LOS: 2 days    Time spent: 35 minutes    Enedelia Martorelli Darleen Crocker, DO [facility]  If 7PM-7AM, please contact night-coverage [email or web address] 07/04/2019, 3:37 PM

## 2019-07-04 NOTE — ED Provider Notes (Signed)
[facility] Provider Note   CSN: [id number] Arrival date & time: 07/02/19  1044     History Chief Complaint  Patient presents with  . Bloated    [name] [name] is a 64 y.o. male.  HPI    64 y/o with advanced CHF comes in with cc of worsening leg swelling. Pt was seen by cardiology and they advised ED visit for admission.  Pt reports 15-20 lbs weight gain. He was on toresemide 40 mg q daily, but had to come off of it recently due to rising Cr. Weight gain has accelerated since medication tweak. Pt has no dib, PND.   Past Medical History:  Diagnosis Date  . Anxiety   . CHF (congestive heart failure) ([place])    a. EF 15% in 2013 with cath showing normal cors b. EF 30-35% by repeat echo in 2015  . Chronic systolic heart failure ([place])   . Diabetes mellitus   . Fluid retention in legs   . Hypertension     Patient Active Problem List   Diagnosis Date Noted  . Atrial fibrillation ([place]) 04/30/2019  . Thrombus in heart chamber 04/30/2019  . Acute respiratory failure with hypoxia ([place]) 04/25/2019  . Acute on chronic systolic (congestive) heart failure ([place]) 04/23/2019  . Chronic pain 04/23/2019  . Chronic renal failure, stage 3a 04/23/2019  . Generalized anxiety disorder 08/14/2014  . Diabetes mellitus type 2, uncontrolled ([place]) 08/14/2014  . Hyperlipidemia 08/14/2014  . Onychomycosis 08/14/2014  . Hypertrophic toenail 08/14/2014  . Tinea versicolor 08/14/2014  . Hepatomegaly 08/14/2014  . Pain in joint, upper arm 03/06/2014  . Cardiomyopathy, dilated, nonischemic ([place]) 10/12/2013  . Chronic systolic heart failure ([place]) 07/08/2011  . Type 2 diabetes with nephropathy ([place]) 07/08/2011  . Hypertension 07/08/2011    Past Surgical History:  Procedure Laterality Date  . COLONOSCOPY  01/04/2012   Procedure: COLONOSCOPY;  Surgeon: Danie Binder, MD;  Location: [facility];  Service: Endoscopy;  Laterality: N/A;  1:30 PM  . HERNIA REPAIR    . LEFT  AND RIGHT HEART CATHETERIZATION WITH CORONARY ANGIOGRAM N/A 07/09/2011   Procedure: LEFT AND RIGHT HEART CATHETERIZATION WITH CORONARY ANGIOGRAM;  Surgeon: Peter M Martinique, MD;  Location: [facility] CATH LAB;  Service: Cardiovascular;  Laterality: N/A;       Family History  Problem Relation Age of Onset  . Heart attack Father   . Heart failure Father   . Cancer Mother        Multiple myeloma  . Heart disease Sister     Social History   Tobacco Use  . Smoking status: Current Every Day Smoker    Packs/day: 0.50    Types: Cigarettes, E-cigarettes    Start date: 10/13/1982    Last attempt to quit: 09/19/2011    Years since quitting: 7.7  . Smokeless tobacco: Never Used  . Tobacco comment: electronic cig for 4 months   Vaping Use  . Vaping Use: Never used  Substance Use Topics  . Alcohol use: Yes    Comment: weekends  . Drug use: No    Home Medications Prior to Admission medications   Medication Sig Start Date End Date Taking? Authorizing Provider  amiodarone (PACERONE) 200 MG tablet TAKE 400 MG TWICE DAILY FOR 1 WEEK THEN 200 MG TWICE DAILY FOR 2 WEEKS THEN TAKE 200 MG DAILY 06/25/19  Yes Branch, Alphonse Guild, MD  gabapentin (NEURONTIN) 100 MG capsule Take 2 capsules (200 mg total) by mouth 3 (  three) times daily. 04/27/19  Yes Tat, Shanon Brow, MD  metFORMIN (GLUCOPHAGE) 500 MG tablet Take 500 mg by mouth 2 (two) times daily. 04/20/19  Yes [provider]  metoprolol succinate (TOPROL-XL) 25 MG 24 hr tablet Take 1.5 tablets (37.5 mg total) by mouth daily. 05/11/19  Yes Strader, [place] M, PA-C  Oxycodone HCl 10 MG TABS Take 10 mg by mouth 4 (four) times daily as needed. 04/11/19  Yes [provider]  potassium chloride SA (KLOR-CON) 20 MEQ tablet Take 2 tablets (40 mEq total) by mouth daily. 06/15/19  Yes Strader, [place] M, PA-C  simvastatin (ZOCOR) 40 MG tablet Take 1 tablet (40 mg total) by mouth at bedtime. 08/15/14  Yes Tysinger, Camelia Eng, PA-C  warfarin (COUMADIN) 2.5 MG tablet Take  3 tablets (7.5 mg total) by mouth daily at 4 PM. 04/28/19  Yes Tat, Shanon Brow, MD  losartan (COZAAR) 25 MG tablet Take 0.5 tablets (12.5 mg total) by mouth daily. Patient not taking: Reported on 07/02/2019 05/11/19   Erma Heritage, PA-C  torsemide (DEMADEX) 20 MG tablet Take 2 tablets (40 mg total) by mouth daily. Patient not taking: Reported on 07/02/2019 06/25/19   Arnoldo Lenis, MD    Allergies    Patient has no known allergies.  Review of Systems   Review of Systems  Constitutional: Positive for activity change and unexpected weight change.  Respiratory: Positive for shortness of breath.   Cardiovascular: Negative for chest pain.  Neurological: Negative for dizziness.  All other systems reviewed and are negative.   Physical Exam Updated Vital Signs BP 109/76 (BP Location: Left Arm)   Pulse 84   Temp 98.3 F (36.8 C) (Oral)   Resp 20   Ht 6' (1.829 m)   Wt 96.9 kg   SpO2 93%   BMI 28.97 kg/m   Physical Exam Vitals and nursing note reviewed.  Constitutional:      Appearance: He is well-developed.  HENT:     Head: Atraumatic.  Cardiovascular:     Rate and Rhythm: Normal rate.  Pulmonary:     Effort: Pulmonary effort is normal.     Breath sounds: No rales.     Comments: Minimal bibasilar rales Musculoskeletal:        General: Swelling present.     Cervical back: Neck supple.     Right lower leg: Edema present.     Left lower leg: Edema present.  Skin:    General: Skin is warm.  Neurological:     Mental Status: He is alert and oriented to person, place, and time.     ED Results / Procedures / Treatments   Labs (all labs ordered are listed, but only abnormal results are displayed) Labs Reviewed  COMPREHENSIVE METABOLIC PANEL - Abnormal; Notable for the following components:      Result Value   Glucose, Bld 168 (*)    BUN 41 (*)    Creatinine, Ser 1.73 (*)    Calcium 7.4 (*)    AST 117 (*)    ALT 617 (*)    GFR calc non Af Amer 41 (*)    GFR calc Af  Amer 48 (*)    All other components within normal limits  CBC WITH DIFFERENTIAL/PLATELET - Abnormal; Notable for the following components:   RBC 3.68 (*)    Hemoglobin 12.4 (*)    HCT 38.0 (*)    MCV 103.3 (*)    nRBC 0.8 (*)    All other components within  normal limits  BRAIN NATRIURETIC PEPTIDE - Abnormal; Notable for the following components:   B Natriuretic Peptide 2,735.0 (*)    All other components within normal limits  HEMOGLOBIN A1C - Abnormal; Notable for the following components:   Hgb A1c MFr Bld 7.9 (*)    All other components within normal limits  PROTIME-INR - Abnormal; Notable for the following components:   Prothrombin Time 48.2 (*)    INR 5.5 (*)    All other components within normal limits  GLUCOSE, CAPILLARY - Abnormal; Notable for the following components:   Glucose-Capillary 103 (*)    All other components within normal limits  MAGNESIUM - Abnormal; Notable for the following components:   Magnesium 1.4 (*)    All other components within normal limits  CBC WITH DIFFERENTIAL/PLATELET - Abnormal; Notable for the following components:   RBC 3.55 (*)    Hemoglobin 11.8 (*)    HCT 36.7 (*)    MCV 103.4 (*)    All other components within normal limits  COMPREHENSIVE METABOLIC PANEL - Abnormal; Notable for the following components:   Potassium 3.4 (*)    Glucose, Bld 107 (*)    BUN 32 (*)    Creatinine, Ser 1.28 (*)    Calcium 7.4 (*)    Total Protein 6.2 (*)    Albumin 3.1 (*)    AST 64 (*)    ALT 448 (*)    Total Bilirubin 1.3 (*)    GFR calc non Af Amer 60 (*)    All other components within normal limits  GLUCOSE, CAPILLARY - Abnormal; Notable for the following components:   Glucose-Capillary 142 (*)    All other components within normal limits  PROTIME-INR - Abnormal; Notable for the following components:   Prothrombin Time 29.9 (*)    INR 3.0 (*)    All other components within normal limits  GLUCOSE, CAPILLARY - Abnormal; Notable for the following  components:   Glucose-Capillary 259 (*)    All other components within normal limits  PROTIME-INR - Abnormal; Notable for the following components:   Prothrombin Time 24.2 (*)    INR 2.3 (*)    All other components within normal limits  CBC - Abnormal; Notable for the following components:   RBC 3.72 (*)    Hemoglobin 12.6 (*)    MCV 104.8 (*)    All other components within normal limits  GLUCOSE, CAPILLARY - Abnormal; Notable for the following components:   Glucose-Capillary 128 (*)    All other components within normal limits  BASIC METABOLIC PANEL - Abnormal; Notable for the following components:   Potassium 3.4 (*)    Glucose, Bld 108 (*)    BUN 24 (*)    Calcium 8.2 (*)    All other components within normal limits  GLUCOSE, CAPILLARY - Abnormal; Notable for the following components:   Glucose-Capillary 111 (*)    All other components within normal limits  SARS CORONAVIRUS 2 BY RT PCR (HOSPITAL ORDER, [place] [facility])  GLUCOSE, CAPILLARY  GLUCOSE, CAPILLARY  MAGNESIUM    EKG None  Radiology DG Chest 2 View  Result Date: 07/02/2019 CLINICAL DATA:  64 year old male with lower extremity swelling for 3 weeks. Cough. Query edema. EXAM: CHEST - 2 VIEW COMPARISON:  Portable chest 04/23/2019 and earlier. FINDINGS: Cardiomegaly again suspected with stable cardiac silhouette. Normal lung volumes. Patchy right lung base opacity which on the lateral view is suspicious for pleural effusion partially tracking into the  major fissure. Small volume of minor fissure fluid also. Pulmonary vascularity is increased compared to 2014 radiographs. No left pleural effusion, pneumothorax, or other confluent opacity. Visualized tracheal air column is within normal limits. No acute osseous abnormality identified. Negative visible bowel gas pattern. IMPRESSION: 1. Cardiomegaly with right lung base opacity favored due to pleural effusion partially tracking into the fissures. 2.  Increased pulmonary vascularity compatible with mild or developing interstitial edema. Electronically Signed   By: Genevie Ann M.D.   On: 07/02/2019 12:12   ECHOCARDIOGRAM COMPLETE  Result Date: 07/03/2019    ECHOCARDIOGRAM REPORT   Patient Name:   [name] [name] Date of Exam: 07/03/2019 Medical Rec #:  [id number]        Height:       72.0 in Accession #:    [id number]       Weight:       218.3 lb Date of Birth:  [birth year]       BSA:          2.211 m Patient Age:    42 years         BP:           101/77 mmHg Patient Gender: M                HR:           75 bpm. Exam Location:  [facility] Procedure: 2D Echo Indications:    Cardiomyopathy-Unspecified 425.9 / I42.9  History:        Patient has prior history of Echocardiogram examinations, most                 recent 04/24/2019. CHF, Arrythmias:Atrial Fibrillation; Risk                 Factors:Hypertension, Diabetes, Current Smoker and Dyslipidemia.                 Thrombus in heart chamber, Chronic renal failure.  Sonographer:    Leavy Cella RDCS (AE) Referring Phys: [phone] Royanne Foots [place]  1. No LV thrombus noted . Left ventricular ejection fraction, by estimation, is 25 to 30%. The left ventricle has severely decreased function. The left ventricle demonstrates global hypokinesis. The left ventricular internal cavity size was moderately dilated. There is mild left ventricular hypertrophy. Left ventricular diastolic parameters are consistent with Grade II diastolic dysfunction (pseudonormalization). Elevated left ventricular end-diastolic pressure.  2. Right ventricular systolic function is moderately reduced. The right ventricular size is mildly enlarged. There is moderately elevated pulmonary artery systolic pressure.  3. Left atrial size was moderately dilated.  4. Right atrial size was mildly dilated.  5. The mitral valve is normal in structure. No evidence of mitral valve regurgitation. No evidence of mitral stenosis.  6. The aortic valve is  tricuspid. Aortic valve regurgitation is not visualized. No aortic stenosis is present.  7. The inferior vena cava is dilated in size with >50% respiratory variability, suggesting right atrial pressure of 8 mmHg. FINDINGS  Left Ventricle: No LV thrombus noted. Left ventricular ejection fraction, by estimation, is 25 to 30%. The left ventricle has severely decreased function. The left ventricle demonstrates global hypokinesis. The left ventricular internal cavity size was moderately dilated. There is mild left ventricular hypertrophy. Left ventricular diastolic parameters are consistent with Grade II diastolic dysfunction (pseudonormalization). Elevated left ventricular end-diastolic pressure. Right Ventricle: The right ventricular size is mildly enlarged. Right vetricular wall thickness was not assessed. Right ventricular systolic function is  moderately reduced. There is moderately elevated pulmonary artery systolic pressure. The tricuspid regurgitant velocity is 3.31 m/s, and with an assumed right atrial pressure of 10 mmHg, the estimated right ventricular systolic pressure is 91.6 mmHg. Left Atrium: Left atrial size was moderately dilated. Right Atrium: Right atrial size was mildly dilated. Pericardium: There is no evidence of pericardial effusion. Mitral Valve: The mitral valve is normal in structure. Normal mobility of the mitral valve leaflets. No evidence of mitral valve regurgitation. No evidence of mitral valve stenosis. Tricuspid Valve: The tricuspid valve is normal in structure. Tricuspid valve regurgitation is mild . No evidence of tricuspid stenosis. Aortic Valve: The aortic valve is tricuspid. Aortic valve regurgitation is not visualized. No aortic stenosis is present. Pulmonic Valve: The pulmonic valve was normal in structure. Pulmonic valve regurgitation is mild. No evidence of pulmonic stenosis. Aorta: The aortic root is normal in size and structure. Venous: The inferior vena cava is dilated in size  with greater than 50% respiratory variability, suggesting right atrial pressure of 8 mmHg. IAS/Shunts: No atrial level shunt detected by color flow Doppler.  LEFT VENTRICLE PLAX 2D LVIDd:         5.43 cm  Diastology LVIDs:         4.63 cm  LV e' lateral:   4.80 cm/s LV PW:         1.29 cm  LV E/e' lateral: 22.7 LV IVS:        1.40 cm  LV e' medial:    2.40 cm/s LVOT diam:     1.90 cm  LV E/e' medial:  45.4 LVOT Area:     2.84 cm  RIGHT VENTRICLE RV S prime:     9.45 cm/s TAPSE (M-mode): 1.9 cm LEFT ATRIUM              Index       RIGHT ATRIUM           Index LA diam:        4.40 cm  1.99 cm/m  RA Area:     23.10 cm LA Vol (A2C):   100.0 ml 45.23 ml/m RA Volume:   82.40 ml  37.27 ml/m LA Vol (A4C):   75.1 ml  33.96 ml/m LA Biplane Vol: 87.7 ml  39.66 ml/m   AORTA Ao Root diam: 2.80 cm MITRAL VALVE                TRICUSPID VALVE MV Area (PHT): 4.80 cm     TR Peak grad:   43.8 mmHg MV Decel Time: 158 msec     TR Vmax:        331.00 cm/s MR Peak grad: 46.5 mmHg MR Mean grad: 32.0 mmHg     SHUNTS MR Vmax:      341.00 cm/s   Systemic Diam: 1.90 cm MR Vmean:     269.0 cm/s MV E velocity: 109.00 cm/s MV A velocity: 23.80 cm/s MV E/A ratio:  4.58 Jenkins Rouge MD Electronically signed by Jenkins Rouge MD Signature Date/Time: 07/03/2019/11:41:20 AM    Final     Procedures Procedures (including critical care time)  Medications Ordered in ED Medications  furosemide (LASIX) injection 80 mg (80 mg Intravenous Given 07/04/19 0224)  gabapentin (NEURONTIN) capsule 200 mg (200 mg Oral Given 07/04/19 0939)  potassium chloride SA (KLOR-CON) CR tablet 40 mEq (40 mEq Oral Given 07/04/19 0936)  sodium chloride flush (NS) 0.9 % injection 3 mL (3 mLs Intravenous Given 07/04/19 0939)  sodium  chloride flush (NS) 0.9 % injection 3 mL (has no administration in time range)  0.9 %  sodium chloride infusion (has no administration in time range)  acetaminophen (TYLENOL) tablet 650 mg (has no administration in time range)    ondansetron (ZOFRAN) injection 4 mg (has no administration in time range)  insulin aspart (novoLOG) injection 0-15 Units (0 Units Subcutaneous Not Given 07/04/19 0741)  insulin aspart (novoLOG) injection 0-5 Units (0 Units Subcutaneous Not Given 07/03/19 2134)  nicotine (NICODERM CQ - dosed in mg/24 hr) patch 7 mg (7 mg Transdermal Patch Applied 07/04/19 0939)  oxyCODONE (Oxy IR/ROXICODONE) immediate release tablet 10 mg (10 mg Oral Given 07/04/19 0242)  amiodarone (PACERONE) tablet 200 mg (200 mg Oral Given 07/04/19 0936)  warfarin (COUMADIN) tablet 3.75 mg (has no administration in time range)  Warfarin - Pharmacist Dosing Inpatient (has no administration in time range)  metoprolol succinate (TOPROL-XL) 24 hr tablet 12.5 mg (12.5 mg Oral Given 07/04/19 0939)  magnesium sulfate IVPB 2 g 50 mL (2 g Intravenous New Bag/Given 07/03/19 1023)    ED Course  I have reviewed the triage vital signs and the nursing notes.  Pertinent labs & imaging results that were available during my care of the patient were reviewed by me and considered in my medical decision making (see chart for details).    MDM Rules/Calculators/A&P                          PT comes in with cc of worsening CHF and renal function. Pt's Cr has improved since the lasix were discontinued, however, his pitting edema has accelerated. Spoke with Dr. Harl Bowie, patient has failed outpatient management of fluid homeostasis and he requests that we admit the patient for CHF exacerbation.   Final Clinical Impression(s) / ED Diagnoses Final diagnoses:  Acute congestive heart failure, unspecified heart failure type [facility])    Rx / DC Orders ED Discharge Orders    None       Varney Biles, MD 07/04/19 [phone]

## 2019-07-04 NOTE — Care Management Important Message (Signed)
Important Message  Patient Details  Name: [name] [name] MRN: [id number] Date of Birth: 06-May-1955   Medicare Important Message Given:  Yes     Tommy Medal 07/04/2019, 10:11 AM

## 2019-07-04 NOTE — Progress Notes (Addendum)
Progress Note  Patient Name: [name] [name] Date of Encounter: 07/04/2019  Primary Cardiologist: Carlyle Dolly, MD   Subjective   No chest pain overnight. Reports mild dizziness last night around the time he had NSVT. Breathing improved. Still with pitting edema.    Inpatient Medications    Scheduled Meds:  amiodarone  200 mg Oral BID   furosemide  80 mg Intravenous Q12H   gabapentin  200 mg Oral TID   insulin aspart  0-15 Units Subcutaneous TID WC   insulin aspart  0-5 Units Subcutaneous QHS   nicotine  7 mg Transdermal Daily   potassium chloride SA  40 mEq Oral Daily   sodium chloride flush  3 mL Intravenous Q12H   warfarin  3.75 mg Oral ONCE-1600   Warfarin - Pharmacist Dosing Inpatient   Does not apply q1600   Continuous Infusions:  sodium chloride     PRN Meds: sodium chloride, acetaminophen, ondansetron (ZOFRAN) IV, oxyCODONE, sodium chloride flush   Vital Signs    Vitals:   07/03/19 2054 07/03/19 2113 07/04/19 0509 07/04/19 0600  BP: 97/76  109/76   Pulse: 75  84   Resp: 20  20   Temp: 98 F (36.7 C)  98.3 F (36.8 C)   TempSrc: Oral  Oral   SpO2: 95% 95% 93%   Weight:    96.9 kg  Height:        Intake/Output Summary (Last 24 hours) at 07/04/2019 0831 Last data filed at 07/04/2019 0500 Gross per 24 hour  Intake 720 ml  Output 3050 ml  Net -2330 ml    Last 3 Weights 07/04/2019 07/03/2019 07/02/2019  Weight (lbs) 213 lb 10 oz 218 lb 4.1 oz 220 lb 12.8 oz  Weight (kg) 96.9 kg 99 kg 100.154 kg      Telemetry    NSR, HR in 70's to 80's. 39 beats NSVT around 2150 on 6/15. - Personally Reviewed  ECG    No new tracings.   Physical Exam   General: Well developed, well nourished, male appearing in no acute distress. Head: Normocephalic, atraumatic.  Neck: Supple without bruits, JVD at 9 cm. Lungs:  Resp regular and unlabored, decreased breath sounds along bases bilaterally. Heart: RRR, S1, S2, no S3, S4, or murmur; no rub. Abdomen: Soft,  non-tender, non-distended with normoactive bowel sounds. No hepatomegaly. No rebound/guarding. No obvious abdominal masses. Extremities: No clubbing or cyanosis, 1+ pitting edema. Distal pedal pulses are 2+ bilaterally. Neuro: Alert and oriented X 3. Moves all extremities spontaneously. Psych: Normal affect.  Labs    Chemistry Recent Labs  Lab 06/29/19 1556 07/02/19 1130 07/03/19 0614  NA 136 139 142  K 3.7 4.0 3.4*  CL 97* 101 103  CO2 23 25 29   GLUCOSE 151* 168* 107*  BUN 76* 41* 32*  CREATININE 3.77* 1.73* 1.28*  CALCIUM 7.3* 7.4* 7.4*  PROT  --  6.7 6.2*  ALBUMIN  --  3.5 3.1*  AST  --  117* 64*  ALT  --  617* 448*  ALKPHOS  --  100 82  BILITOT  --  0.4 1.3*  GFRNONAA 16* 41* 60*  GFRAA 19* 48* >60  ANIONGAP 16* 13 10     Hematology Recent Labs  Lab 07/02/19 1130 07/03/19 0614 07/04/19 0534  WBC 6.5 5.4 6.5  RBC 3.68* 3.55* 3.72*  HGB 12.4* 11.8* 12.6*  HCT 38.0* 36.7* 39.0  MCV 103.3* 103.4* 104.8*  MCH 33.7 33.2 33.9  MCHC 32.6 32.2 32.3  RDW 14.7 14.7 14.9  PLT 198 182 209    Cardiac EnzymesNo results for input(s): TROPONINI in the last 168 hours. No results for input(s): TROPIPOC in the last 168 hours.   BNP Recent Labs  Lab 07/02/19 1130  BNP 2,735.0*     DDimer No results for input(s): DDIMER in the last 168 hours.   Radiology    DG Chest 2 View  Result Date: 07/02/2019 CLINICAL DATA:  64 year old male with lower extremity swelling for 3 weeks. Cough. Query edema. EXAM: CHEST - 2 VIEW COMPARISON:  Portable chest 04/23/2019 and earlier. FINDINGS: Cardiomegaly again suspected with stable cardiac silhouette. Normal lung volumes. Patchy right lung base opacity which on the lateral view is suspicious for pleural effusion partially tracking into the major fissure. Small volume of minor fissure fluid also. Pulmonary vascularity is increased compared to 2014 radiographs. No left pleural effusion, pneumothorax, or other confluent opacity. Visualized  tracheal air column is within normal limits. No acute osseous abnormality identified. Negative visible bowel gas pattern. IMPRESSION: 1. Cardiomegaly with right lung base opacity favored due to pleural effusion partially tracking into the fissures. 2. Increased pulmonary vascularity compatible with mild or developing interstitial edema. Electronically Signed   By: Genevie Ann M.D.   On: 07/02/2019 12:12   ECHOCARDIOGRAM COMPLETE  Result Date: 07/03/2019    ECHOCARDIOGRAM REPORT   Patient Name:   DIERKS WACH Wickard Date of Exam: 07/03/2019 Medical Rec #:  [id number]        Height:       72.0 in Accession #:    [id number]       Weight:       218.3 lb Date of Birth:  [birth year]       BSA:          2.211 m Patient Age:    22 years         BP:           101/77 mmHg Patient Gender: M                HR:           75 bpm. Exam Location:  [facility] Procedure: 2D Echo Indications:    Cardiomyopathy-Unspecified 425.9 / I42.9  History:        Patient has prior history of Echocardiogram examinations, most                 recent 04/24/2019. CHF, Arrythmias:Atrial Fibrillation; Risk                 Factors:Hypertension, Diabetes, Current Smoker and Dyslipidemia.                 Thrombus in heart chamber, Chronic renal failure.  Sonographer:    Leavy Cella RDCS (AE) Referring Phys: [phone] Royanne Foots [place]  1. No LV thrombus noted . Left ventricular ejection fraction, by estimation, is 25 to 30%. The left ventricle has severely decreased function. The left ventricle demonstrates global hypokinesis. The left ventricular internal cavity size was moderately dilated. There is mild left ventricular hypertrophy. Left ventricular diastolic parameters are consistent with Grade II diastolic dysfunction (pseudonormalization). Elevated left ventricular end-diastolic pressure.  2. Right ventricular systolic function is moderately reduced. The right ventricular size is mildly enlarged. There is moderately elevated pulmonary artery  systolic pressure.  3. Left atrial size was moderately dilated.  4. Right atrial size was mildly dilated.  5. The mitral valve is normal in structure. No evidence of  mitral valve regurgitation. No evidence of mitral stenosis.  6. The aortic valve is tricuspid. Aortic valve regurgitation is not visualized. No aortic stenosis is present.  7. The inferior vena cava is dilated in size with >50% respiratory variability, suggesting right atrial pressure of 8 mmHg. FINDINGS  Left Ventricle: No LV thrombus noted. Left ventricular ejection fraction, by estimation, is 25 to 30%. The left ventricle has severely decreased function. The left ventricle demonstrates global hypokinesis. The left ventricular internal cavity size was moderately dilated. There is mild left ventricular hypertrophy. Left ventricular diastolic parameters are consistent with Grade II diastolic dysfunction (pseudonormalization). Elevated left ventricular end-diastolic pressure. Right Ventricle: The right ventricular size is mildly enlarged. Right vetricular wall thickness was not assessed. Right ventricular systolic function is moderately reduced. There is moderately elevated pulmonary artery systolic pressure. The tricuspid regurgitant velocity is 3.31 m/s, and with an assumed right atrial pressure of 10 mmHg, the estimated right ventricular systolic pressure is 40.0 mmHg. Left Atrium: Left atrial size was moderately dilated. Right Atrium: Right atrial size was mildly dilated. Pericardium: There is no evidence of pericardial effusion. Mitral Valve: The mitral valve is normal in structure. Normal mobility of the mitral valve leaflets. No evidence of mitral valve regurgitation. No evidence of mitral valve stenosis. Tricuspid Valve: The tricuspid valve is normal in structure. Tricuspid valve regurgitation is mild . No evidence of tricuspid stenosis. Aortic Valve: The aortic valve is tricuspid. Aortic valve regurgitation is not visualized. No aortic stenosis  is present. Pulmonic Valve: The pulmonic valve was normal in structure. Pulmonic valve regurgitation is mild. No evidence of pulmonic stenosis. Aorta: The aortic root is normal in size and structure. Venous: The inferior vena cava is dilated in size with greater than 50% respiratory variability, suggesting right atrial pressure of 8 mmHg. IAS/Shunts: No atrial level shunt detected by color flow Doppler.  LEFT VENTRICLE PLAX 2D LVIDd:         5.43 cm  Diastology LVIDs:         4.63 cm  LV e' lateral:   4.80 cm/s LV PW:         1.29 cm  LV E/e' lateral: 22.7 LV IVS:        1.40 cm  LV e' medial:    2.40 cm/s LVOT diam:     1.90 cm  LV E/e' medial:  45.4 LVOT Area:     2.84 cm  RIGHT VENTRICLE RV S prime:     9.45 cm/s TAPSE (M-mode): 1.9 cm LEFT ATRIUM              Index       RIGHT ATRIUM           Index LA diam:        4.40 cm  1.99 cm/m  RA Area:     23.10 cm LA Vol (A2C):   100.0 ml 45.23 ml/m RA Volume:   82.40 ml  37.27 ml/m LA Vol (A4C):   75.1 ml  33.96 ml/m LA Biplane Vol: 87.7 ml  39.66 ml/m   AORTA Ao Root diam: 2.80 cm MITRAL VALVE                TRICUSPID VALVE MV Area (PHT): 4.80 cm     TR Peak grad:   43.8 mmHg MV Decel Time: 158 msec     TR Vmax:        331.00 cm/s MR Peak grad: 46.5 mmHg MR Mean grad: 32.0 mmHg  SHUNTS MR Vmax:      341.00 cm/s   Systemic Diam: 1.90 cm MR Vmean:     269.0 cm/s MV E velocity: 109.00 cm/s MV A velocity: 23.80 cm/s MV E/A ratio:  4.58 Jenkins Rouge MD Electronically signed by Jenkins Rouge MD Signature Date/Time: 07/03/2019/11:41:20 AM    Final     Cardiac Studies   Echocardiogram: 07/03/2019 IMPRESSIONS     1. No LV thrombus noted . Left ventricular ejection fraction, by  estimation, is 25 to 30%. The left ventricle has severely decreased  function. The left ventricle demonstrates global hypokinesis. The left  ventricular internal cavity size was moderately  dilated. There is mild left ventricular hypertrophy. Left ventricular  diastolic parameters  are consistent with Grade II diastolic dysfunction  (pseudonormalization). Elevated left ventricular end-diastolic pressure.   2. Right ventricular systolic function is moderately reduced. The right  ventricular size is mildly enlarged. There is moderately elevated  pulmonary artery systolic pressure.   3. Left atrial size was moderately dilated.   4. Right atrial size was mildly dilated.   5. The mitral valve is normal in structure. No evidence of mitral valve  regurgitation. No evidence of mitral stenosis.   6. The aortic valve is tricuspid. Aortic valve regurgitation is not  visualized. No aortic stenosis is present.   7. The inferior vena cava is dilated in size with >50% respiratory  variability, suggesting right atrial pressure of 8 mmHg.   Patient Profile     64 y.o. male w/ PMH of chronic combined systolic and diastolic CHF/NICM (EF 78% in 2013 with cath showing normal cors, EF 30-35% by repeat echo in 2015 with no outpatient follow-up from 2016 to 04/2019, EF at 25-30% by echo in 04/2019), Apical thrombus (diagnosed by echocardiogram in 04/2019), paroxysmal atrial fibrillation, HTN, HLD, Type 2 DM and Stage 3 CKD who is currently admitted for an acute CHF exacerbation.   Assessment & Plan    1. Acute on Chronic Combined Systolic and Diastolic CHF/NICM - EF was previously 15% in 2013 with cath showing normal cors, EF 30-35% by repeat echo in 2015 with no outpatient follow-up from 2016 to 04/2019, EF at 25-30% by echo in 04/2019. Repeat this admission unchanged with EF at 25-30%.  - he has been receiving IV Lasix 80mg  BID with a recorded output of -4.0 L thus far and weight has declined from 225 lbs to 213 lbs. Continue with IV Lasix today.  - he had 39 beats NSVT yesterday evening, likely due to his underlying cardiomyopathy. Repeat BMET and Mg pending. His last ischemic evaluation was a cath in 2013. Will review with Dr. Johnsie Cancel in regards to a repeat catheterization this admission  once volume status has been optimized. Would need to stop Coumadin and bridge with Heparin. The patient previously declined ICD placement and says this is still something he remains hesitant about pursuing but would be in agreement for a repeat cath if indicated.  - will plan to restart Toprol-XL 12.5mg  daily. No ACE-I/ARB/ARNI for now given hypotension and variable renal function.   2. Apical Thrombus - echocardiogram in 04/2019 showed a 1.0 x 0.92 cm apical thrombus that was semi mobile. Repeat this admission shows no evidence of LV thrombus.    3. Persistent Atrial Fibrillation - he was started on Amiodarone 400mg  BID on 06/25/2019. Reduced to 200mg  BID on admission and should remains on this for 2 weeks then 200mg  daily.  - he is maintaining NSR. Given his apical thrimbus  had resolved by repeat echo, will review with Dr. Johnsie Cancel in regards to switching from Coumadin to a NOAC prior to discharge.    4. NSVT - he had 39 beats NSVT overnight.  - Mg was 1.4 on 6/15 and replacement was ordered. Repeat Mg and BMET pending for this AM given his recurrent NSVT. Will need to consider repeat ischemic evaluation as outlined above.    5. Elevated LFT's - AST 117 and ALT 617 on admission, improved to 64 and 448 as of 6/15. Will recheck with AM labs for tomorrow.    6. Stage 3 CKD - baseline creatinine ~ 1.6. Had peaked at 3.77 in the outpatient setting, improved to 1.28 on 07/03/2019. Repeat pending for this AM.     For questions or updates, please contact [place] Please consult [email or web address] for contact info under Cardiology/STEMI.   Arna Medici , PA-C 8:31 AM 07/04/2019 Pager: [phone]  Patient examined chart reviewed discussed care with patient and PA Had good diuresis. Cr in normal range now ? Lab error on one measured over 3. K low supplement Mg ok. Change to eliquis for PAF no LV thrombus on echo this admission I spoke with Dr Lovena Le EP today and he indicated that no  life vest needed and EP could see electively for consideration of AICD as outpatient Patient has been hesitant for this in past. If his CHF clears quickly can consider outpatient myovue and not cath to assess for CAD again If Cr stays stable after diuresis and systolic BP over 973 mmHg may try low dose ARB/Entresto   Jenkins Rouge MD [facility]

## 2019-07-04 NOTE — Progress Notes (Addendum)
ANTICOAGULATION CONSULT NOTE -  Pharmacy Consult for Coumadin Indication: atrial fibrillation/LV thrombu  No Known Allergies  Patient Measurements: Height: 6' (182.9 cm) Weight: 96.9 kg (213 lb 10 oz) IBW/kg (Calculated) : 77.6  Vital Signs: Temp: 98.3 F (36.8 C) (06/16 0509) Temp Source: Oral (06/16 0509) BP: 109/76 (06/16 0509) Pulse Rate: 84 (06/16 0509)  Labs: Recent Labs    07/02/19 1130 07/02/19 1130 07/03/19 0614 07/03/19 0752 07/04/19 0534  HGB 12.4*   < > 11.8*  --  12.6*  HCT 38.0*  --  36.7*  --  39.0  PLT 198  --  182  --  209  LABPROT 48.2*  --   --  29.9* 24.2*  INR 5.5*  --   --  3.0* 2.3*  CREATININE 1.73*  --  1.28*  --   --    < > = values in this interval not displayed.    Estimated Creatinine Clearance: 72.2 mL/min (A) (by C-G formula based on SCr of 1.28 mg/dL (H)).   Medical History: Past Medical History:  Diagnosis Date  . Anxiety   . CHF (congestive heart failure) ([place])    a. EF 15% in 2013 with cath showing normal cors b. EF 30-35% by repeat echo in 2015  . Chronic systolic heart failure ([place])   . Diabetes mellitus   . Fluid retention in legs   . Hypertension     Medications:  Medications Prior to Admission  Medication Sig Dispense Refill Last Dose  . amiodarone (PACERONE) 200 MG tablet TAKE 400 MG TWICE DAILY FOR 1 WEEK THEN 200 MG TWICE DAILY FOR 2 WEEKS THEN TAKE 200 MG DAILY 180 tablet 1 07/01/2019 at Unknown time  . gabapentin (NEURONTIN) 100 MG capsule Take 2 capsules (200 mg total) by mouth 3 (three) times daily. 180 capsule 1 07/01/2019 at Unknown time  . metFORMIN (GLUCOPHAGE) 500 MG tablet Take 500 mg by mouth 2 (two) times daily.   07/01/2019 at Unknown time  . metoprolol succinate (TOPROL-XL) 25 MG 24 hr tablet Take 1.5 tablets (37.5 mg total) by mouth daily. 135 tablet 3 07/01/2019 at Unknown time  . Oxycodone HCl 10 MG TABS Take 10 mg by mouth 4 (four) times daily as needed.   07/01/2019 at Unknown time  . potassium  chloride SA (KLOR-CON) 20 MEQ tablet Take 2 tablets (40 mEq total) by mouth daily. 180 tablet 3 07/01/2019 at Unknown time  . simvastatin (ZOCOR) 40 MG tablet Take 1 tablet (40 mg total) by mouth at bedtime. 90 tablet 1 07/01/2019 at Unknown time  . warfarin (COUMADIN) 2.5 MG tablet Take 3 tablets (7.5 mg total) by mouth daily at 4 PM. 90 tablet 1 07/01/2019 at Unknown time  . losartan (COZAAR) 25 MG tablet Take 0.5 tablets (12.5 mg total) by mouth daily. (Patient not taking: Reported on 07/02/2019) 45 tablet 3 Not Taking at Unknown time  . torsemide (DEMADEX) 20 MG tablet Take 2 tablets (40 mg total) by mouth daily. (Patient not taking: Reported on 07/02/2019) 90 tablet 1 Not Taking at Unknown time    Assessment: 64 y.o. male with medical history significant for systolic congestive heart failure with LVEF 30-35% on echocardiogram in 2015, LV thrombus currently on Coumadin, ongoing tobacco abuse, history of atrial fibrillation, hypertension, and type 2 diabetes with diabetic neuropathy. Presented to the ED with volume overload, 14 lb weight gain. Pharmacy asked to dose coumadin. INR supratherapeutic at 5.5.  INR is 2.3  Patient on amiodarone twice daily which  started on 6/7- home dose will likely need to be decreased Home dose is 3.75mg  on M,W,F and 5mg  on T, Th, Sat, Sun  Goal of Therapy:  INR 2-3 Monitor platelets by anticoagulation protocol: Yes   Plan:  Coumadin 3.75 mg x 1 dose Daily PT-INR Monitor for S/S of bleeding  Margot Ables, PharmD Clinical Pharmacist 07/04/2019 8:29 AM

## 2019-07-04 NOTE — Progress Notes (Signed)
ANTICOAGULATION CONSULT NOTE - Initial Consult  Pharmacy Consult for warfarin to apixaban Indication: afib and apical thrombus from 04/2019  No Known Allergies  Patient Measurements: Height: 6' (182.9 cm) Weight: 96.9 kg (213 lb 10 oz) IBW/kg (Calculated) : 77.6   Vital Signs: Temp: 98.3 F (36.8 C) (06/16 0509) Temp Source: Oral (06/16 0509) BP: 109/76 (06/16 0509) Pulse Rate: 84 (06/16 0509)  Labs: Recent Labs    07/02/19 1130 07/02/19 1130 07/03/19 0614 07/03/19 0752 07/04/19 0534 07/04/19 0744  HGB 12.4*   < > 11.8*  --  12.6*  --   HCT 38.0*  --  36.7*  --  39.0  --   PLT 198  --  182  --  209  --   LABPROT 48.2*  --   --  29.9* 24.2*  --   INR 5.5*  --   --  3.0* 2.3*  --   CREATININE 1.73*  --  1.28*  --   --  1.17   < > = values in this interval not displayed.    Estimated Creatinine Clearance: 79 mL/min (by C-G formula based on SCr of 1.17 mg/dL).   Medical History: Past Medical History:  Diagnosis Date  . Anxiety   . CHF (congestive heart failure) ([place])    a. EF 15% in 2013 with cath showing normal cors b. EF 30-35% by repeat echo in 2015  . Chronic systolic heart failure ([place])   . Diabetes mellitus   . Fluid retention in legs   . Hypertension     Medications:  Medications Prior to Admission  Medication Sig Dispense Refill Last Dose  . amiodarone (PACERONE) 200 MG tablet TAKE 400 MG TWICE DAILY FOR 1 WEEK THEN 200 MG TWICE DAILY FOR 2 WEEKS THEN TAKE 200 MG DAILY 180 tablet 1 07/01/2019 at Unknown time  . gabapentin (NEURONTIN) 100 MG capsule Take 2 capsules (200 mg total) by mouth 3 (three) times daily. 180 capsule 1 07/01/2019 at Unknown time  . metFORMIN (GLUCOPHAGE) 500 MG tablet Take 500 mg by mouth 2 (two) times daily.   07/01/2019 at Unknown time  . metoprolol succinate (TOPROL-XL) 25 MG 24 hr tablet Take 1.5 tablets (37.5 mg total) by mouth daily. 135 tablet 3 07/01/2019 at Unknown time  . Oxycodone HCl 10 MG TABS Take 10 mg by mouth 4 (four)  times daily as needed.   07/01/2019 at Unknown time  . potassium chloride SA (KLOR-CON) 20 MEQ tablet Take 2 tablets (40 mEq total) by mouth daily. 180 tablet 3 07/01/2019 at Unknown time  . simvastatin (ZOCOR) 40 MG tablet Take 1 tablet (40 mg total) by mouth at bedtime. 90 tablet 1 07/01/2019 at Unknown time  . warfarin (COUMADIN) 2.5 MG tablet Take 3 tablets (7.5 mg total) by mouth daily at 4 PM. 90 tablet 1 07/01/2019 at Unknown time  . losartan (COZAAR) 25 MG tablet Take 0.5 tablets (12.5 mg total) by mouth daily. (Patient not taking: Reported on 07/02/2019) 45 tablet 3 Not Taking at Unknown time  . torsemide (DEMADEX) 20 MG tablet Take 2 tablets (40 mg total) by mouth daily. (Patient not taking: Reported on 07/02/2019) 90 tablet 1 Not Taking at Unknown time    Assessment: 63 y.o.malewith medical history significant forsystolic congestive heart failure with LVEF 30-35% on echocardiogram in 2015, LV thrombus currently on Coumadin, ongoing tobacco abuse, history of atrial fibrillation.  Transitioning from warfarin to apixaban.  Goal of Therapy:   Monitor platelets by anticoagulation protocol: Yes  Plan:  Stop warfarin. Start apixaban 5 mg twice daily once INR is less than 2. Monitor labs and s/s of bleeding.  Margot Ables, PharmD Clinical Pharmacist 07/04/2019 10:34 AM

## 2019-07-05 LAB — COMPREHENSIVE METABOLIC PANEL
ALT: 252 U/L — ABNORMAL HIGH (ref 0–44)
AST: 27 U/L (ref 15–41)
Albumin: 3.5 g/dL (ref 3.5–5.0)
Alkaline Phosphatase: 96 U/L (ref 38–126)
Anion gap: 12 (ref 5–15)
BUN: 22 mg/dL (ref 8–23)
CO2: 28 mmol/L (ref 22–32)
Calcium: 8.5 mg/dL — ABNORMAL LOW (ref 8.9–10.3)
Chloride: 100 mmol/L (ref 98–111)
Creatinine, Ser: 1.06 mg/dL (ref 0.61–1.24)
GFR calc Af Amer: >60 mL/min (ref >60)
GFR calc non Af Amer: >60 mL/min (ref >60)
Glucose, Bld: 136 mg/dL — ABNORMAL HIGH (ref 70–99)
Potassium: 3.4 mmol/L — ABNORMAL LOW (ref 3.5–5.1)
Sodium: 140 mmol/L (ref 135–145)
Total Bilirubin: 1.5 mg/dL — ABNORMAL HIGH (ref 0.3–1.2)
Total Protein: 7.2 g/dL (ref 6.5–8.1)

## 2019-07-05 LAB — GLUCOSE, CAPILLARY
Glucose-Capillary: 138 mg/dL — ABNORMAL HIGH (ref 70–99)
Glucose-Capillary: 180 mg/dL — ABNORMAL HIGH (ref 70–99)
Glucose-Capillary: 96 mg/dL (ref 70–99)
Glucose-Capillary: 178 mg/dL — ABNORMAL HIGH (ref 70–99)

## 2019-07-05 LAB — MAGNESIUM: Magnesium: 1.4 mg/dL — ABNORMAL LOW (ref 1.7–2.4)

## 2019-07-05 LAB — PROTIME-INR
INR: 1.6 — ABNORMAL HIGH (ref 0.8–1.2)
Prothrombin Time: 18.5 seconds — ABNORMAL HIGH (ref 11.4–15.2)

## 2019-07-05 MED ORDER — METOPROLOL SUCCINATE ER 25 MG PO TB24
25.0000 mg | ORAL_TABLET | Freq: Every day | ORAL | Status: DC
  Administered 2019-07-05 – 2019-07-07 (×3): 25 mg via ORAL
  Filled 2019-07-05 (×3): qty 1

## 2019-07-05 MED ORDER — POTASSIUM CHLORIDE CRYS ER 20 MEQ PO TBCR
40.0000 meq | EXTENDED_RELEASE_TABLET | Freq: Two times a day (BID) | ORAL | Status: DC
  Administered 2019-07-05 – 2019-07-07 (×5): 40 meq via ORAL
  Filled 2019-07-05 (×5): qty 2

## 2019-07-05 MED ORDER — MAGNESIUM SULFATE 2 GM/50ML IV SOLN
2.0000 g | Freq: Once | INTRAVENOUS | Status: AC
  Administered 2019-07-05: 2 g via INTRAVENOUS
  Filled 2019-07-05: qty 50

## 2019-07-05 MED ORDER — APIXABAN 5 MG PO TABS
5.0000 mg | ORAL_TABLET | Freq: Two times a day (BID) | ORAL | Status: DC
  Administered 2019-07-05 – 2019-07-07 (×5): 5 mg via ORAL
  Filled 2019-07-05 (×5): qty 1

## 2019-07-05 NOTE — Progress Notes (Signed)
PROGRESS NOTE    [name] [name]  IWL:[id number] DOB: 1955/09/07 DOA: 07/02/2019 PCP: Lucia Gaskins, MD   Brief Narrative:  As per H&P written by Dr. Manuella Ghazi on 07/02/2019 63 y.o.malewith medical history significant forsystolic congestive heart failure with LVEF 30-35% on echocardiogram in 2015, LV thrombus currently on Coumadin, ongoing tobacco abuse, history of atrial fibrillation, hypertension, and type 2 diabetes with diabetic neuropathy who presented to the ED after being seen in the cardiology clinic earlier today by his cardiologist Dr. Harl Bowie. He was noted to be volume overloaded with an approximately 13 pound weight gain since 04/2019. He did not appear to tolerate increasing doses of torsemide at home and was noted to have severe AKI with creatinine up to 3.77 for which torsemide was held. Since he was noted to have difficulty with diuresis in the outpatient setting, he was referred to the ED for admission for inpatient diuresis and close monitoring as well as repeat 2D echocardiogram.He denies any chest pain or shortness of breath. No orthopnea or paroxysmal nocturnal dyspnea noted. No fevers,or chills, or cough.  ED Course:Stable vital signs noted. And creatinine currently 1.73 which is near his baseline. He is noted to have some mild transaminitis with AST 117 and ALT 617. BNP is 2735. Two-view chest x-ray with some cardiomegaly and right-sided pleural effusion and interstitial edema noted. Covid testing negative. IV Lasix has been ordered and pending.  -He continues to diurese quite well with 2 L overnight and total 6.7 L thus far.  Weight is down to 211 pounds.  Assessment & Plan:   Active Problems:   Acute on chronic systolic (congestive) heart failure (HCC)   1-Acute on chronic systolic (congestive) heart failure ([place]) -lastEjection fraction30-35% on 04/2019 -2D echorepeated with LVEF 25-30% during this admission -good urine output appreciated with  diuresis ongoing as noted above. -continue current IV diuresis as ordered -follow renal function and electrolytes, replete potassium and magnesium today -followdaily weights and strict I's and O's -long discussion about low sodium diet  -Appreciate cardiology recommendations ongoing -Toprol-XL from 12.5 mg daily to 25 mg daily -Wean off nasal cannula as tolerated.  2-hx of persistent atrial fibrillation -stable but with recurrence and therefore Toprol-XL to 25 mg in 6/17 -Continue amiodarone -Eliquis 5 mg twice daily started today as INR is 1.6  3-HTN -soft on admission, but improved -metoprolol dose to increase as noted above -stable currently -continue IV lasix for ongoing diuresis with noted volume overload -follow rec's by cardiology for further heart meds resumption  4-LV thrombus -Not present on echo this admission -Coumadin DC and Eliquis started 6/17  5-acute on chronic renal failure (stage 3a)  -continue avoiding hypotension and minimizingnephrotoxic agents -follow renal function trend -follow cardiology service rec's for recommended diuretics.  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 Lovena Le EP -Further invasive vs noninvasive imaging per Cardiology  12-CKD stage III -Baseline creatinine approximately 1.6 -Stable at 1.06 this a.m.   DVT  prophylaxis:Eliquis Code Status:Full code Family Communication:No family at bedside. Disposition:  Status is: Inpatient  Dispo: The  patient is from:Home Anticipated d/c is OJ:JKKX Anticipated d/c date is: 1-2 daysbased on further improvement/stability of his heart failure exacerbation. Patient currently not medically stable for discharge,still short winded with activity and having some orthopnea. Fluid overload on examination. Feeling better and with good ongoing diuresis with current treatment. Follow cardiology service recommendations. No chest pain or palpitations.  Consultants:  Cardiology service.   Procedures: See below for x-ray reports.   Antimicrobials: None  Subjective: Patient seen and evaluated today with no new acute complaints or concerns. No acute concerns or events noted overnight.  He states that his dyspnea has improved.  He has some recurrence of atrial fibrillation on telemetry.  Objective: Vitals:   07/04/19 1427 07/04/19 1958 07/04/19 2107 07/05/19 0546  BP: 120/88  115/76 126/85  Pulse: 80  81 85  Resp:   18   Temp: 97.8 F (36.6 C)  97.8 F (36.6 C) 98.4 F (36.9 C)  TempSrc: Oral  Oral Oral  SpO2: 99% (!) 86% 100% 99%  Weight:    95.8 kg  Height:        Intake/Output Summary (Last 24 hours) at 07/05/2019 0930 Last data filed at 07/05/2019 0600 Gross per 24 hour  Intake 240 ml  Output 2300 ml  Net -2060 ml   Filed Weights   07/03/19 0600 07/04/19 0600 07/05/19 0546  Weight: 99 kg 96.9 kg 95.8 kg    Examination:  General exam: Appears calm and comfortable  Respiratory system: Clear to auscultation. Respiratory effort normal.  Currently on 2 L nasal cannula oxygen Cardiovascular system: S1 & S2 heard, irregular. No JVD, murmurs, rubs, gallops or clicks.  Bilateral ankle edema still noted. Gastrointestinal system: Abdomen is nondistended, soft and nontender. No organomegaly or  masses felt. Normal bowel sounds heard. Central nervous system: Alert and oriented. No focal neurological deficits. Extremities: Ankle edema bilaterally. Skin: No rashes, lesions or ulcers Psychiatry: Judgement and insight appear normal. Mood & affect appropriate.     Data Reviewed: I have personally reviewed following labs and imaging studies  CBC: Recent Labs  Lab 07/02/19 1130 07/03/19 0614 07/04/19 0534  WBC 6.5 5.4 6.5  NEUTROABS 4.4 3.3  --   HGB 12.4* 11.8* 12.6*  HCT 38.0* 36.7* 39.0  MCV 103.3* 103.4* 104.8*  PLT 198 182 381   Basic Metabolic Panel: Recent Labs  Lab 06/29/19 1556 07/02/19 1130 07/03/19 0614 07/04/19 0744 07/05/19 0537  NA 136 139 142 142 140  K 3.7 4.0 3.4* 3.4* 3.4*  CL 97* 101 103 101 100  CO2 23 25 29 27 28   GLUCOSE 151* 168* 107* 108* 136*  BUN 76* 41* 32* 24* 22  CREATININE 3.77* 1.73* 1.28* 1.17 1.06  CALCIUM 7.3* 7.4* 7.4* 8.2* 8.5*  MG  --   --  1.4* 1.7 1.4*   GFR: Estimated Creatinine Clearance: 86.8 mL/min (by C-G formula based on SCr of 1.06 mg/dL). Liver Function Tests: Recent Labs  Lab 07/02/19 1130 07/03/19 0614 07/05/19 0537  AST 117* 64* 27  ALT 617* 448* 252*  ALKPHOS 100 82 96  BILITOT 0.4 1.3* 1.5*  PROT 6.7 6.2* 7.2  ALBUMIN 3.5 3.1* 3.5   No results for input(s): LIPASE, AMYLASE in the last 168 hours. No results for input(s): AMMONIA in the last 168 hours. Coagulation Profile: Recent Labs  Lab 07/02/19 1130 07/03/19 0752 07/04/19 0534 07/05/19 0537  INR 5.5* 3.0* 2.3* 1.6*   Cardiac Enzymes: No results for input(s): CKTOTAL, CKMB, CKMBINDEX, TROPONINI in the last 168  hours. BNP (last 3 results) No results for input(s): PROBNP in the last 8760 hours. HbA1C: Recent Labs    07/02/19 1130  HGBA1C 7.9*   CBG: Recent Labs  Lab 07/04/19 0730 07/04/19 1110 07/04/19 1641 07/04/19 2108 07/05/19 0754  GLUCAP 111* 231* 117* 146* 138*   Lipid Profile: No results for input(s): CHOL, HDL, LDLCALC,  TRIG, CHOLHDL, LDLDIRECT in the last 72 hours. Thyroid Function Tests: No results for input(s): TSH, T4TOTAL, FREET4, T3FREE, THYROIDAB in the last 72 hours. Anemia Panel: No results for input(s): VITAMINB12, FOLATE, FERRITIN, TIBC, IRON, RETICCTPCT in the last 72 hours. Sepsis Labs: No results for input(s): PROCALCITON, LATICACIDVEN in the last 168 hours.  Recent Results (from the past 240 hour(s))  SARS Coronavirus 2 by RT PCR (hospital order, performed in [facility] hospital lab) Nasopharyngeal Nasopharyngeal Swab     Status: None   Collection Time: 07/02/19 11:57 AM   Specimen: Nasopharyngeal Swab  Result Value Ref Range Status   SARS Coronavirus 2 NEGATIVE NEGATIVE Final    Comment: (NOTE) SARS-CoV-2 target nucleic acids are NOT DETECTED.  The SARS-CoV-2 RNA is generally detectable in upper and lower respiratory specimens during the acute phase of infection. The lowest concentration of SARS-CoV-2 viral copies this assay can detect is 250 copies / mL. A negative result does not preclude SARS-CoV-2 infection and should not be used as the sole basis for treatment or other patient management decisions.  A negative result may occur with improper specimen collection / handling, submission of specimen other than nasopharyngeal swab, presence of viral mutation(s) within the areas targeted by this assay, and inadequate number of viral copies (<250 copies / mL). A negative result must be combined with clinical observations, patient history, and epidemiological information.  Fact Sheet for Patients:   StrictlyIdeas.no  Fact Sheet for Healthcare Providers: BankingDealers.co.za  This test is not yet approved or  cleared by the [place] FDA and has been authorized for detection and/or diagnosis of SARS-CoV-2 by FDA under an Emergency Use Authorization (EUA).  This EUA will remain in effect (meaning this test can be used) for the  duration of the COVID-19 declaration under Section 564(b)(1) of the Act, 21 U.S.C. section 360bbb-3(b)(1), unless the authorization is terminated or revoked sooner.  Performed at [facility], [address], [place], [place] [address]          Radiology Studies: ECHOCARDIOGRAM COMPLETE  Result Date: 07/03/2019    ECHOCARDIOGRAM REPORT   Patient Name:   [name] [name] Date of Exam: 07/03/2019 Medical Rec #:  [id number]        Height:       72.0 in Accession #:    [id number]       Weight:       218.3 lb Date of Birth:  07-25-1955       BSA:          2.211 m Patient Age:    74 years         BP:           101/77 mmHg Patient Gender: M                HR:           75 bpm. Exam Location:  [facility] Procedure: 2D Echo Indications:    Cardiomyopathy-Unspecified 425.9 / I42.9  History:        Patient has prior history of Echocardiogram examinations, most  recent 04/24/2019. CHF, Arrythmias:Atrial Fibrillation; Risk                 Factors:Hypertension, Diabetes, Current Smoker and Dyslipidemia.                 Thrombus in heart chamber, Chronic renal failure.  Sonographer:    Leavy Cella RDCS (AE) Referring Phys: [phone] Royanne Foots [place]  1. No LV thrombus noted . Left ventricular ejection fraction, by estimation, is 25 to 30%. The left ventricle has severely decreased function. The left ventricle demonstrates global hypokinesis. The left ventricular internal cavity size was moderately dilated. There is mild left ventricular hypertrophy. Left ventricular diastolic parameters are consistent with Grade II diastolic dysfunction (pseudonormalization). Elevated left ventricular end-diastolic pressure.  2. Right ventricular systolic function is moderately reduced. The right ventricular size is mildly enlarged. There is moderately elevated pulmonary artery systolic pressure.  3. Left atrial size was moderately dilated.  4. Right atrial size was mildly dilated.  5. The mitral valve is  normal in structure. No evidence of mitral valve regurgitation. No evidence of mitral stenosis.  6. The aortic valve is tricuspid. Aortic valve regurgitation is not visualized. No aortic stenosis is present.  7. The inferior vena cava is dilated in size with >50% respiratory variability, suggesting right atrial pressure of 8 mmHg. FINDINGS  Left Ventricle: No LV thrombus noted. Left ventricular ejection fraction, by estimation, is 25 to 30%. The left ventricle has severely decreased function. The left ventricle demonstrates global hypokinesis. The left ventricular internal cavity size was moderately dilated. There is mild left ventricular hypertrophy. Left ventricular diastolic parameters are consistent with Grade II diastolic dysfunction (pseudonormalization). Elevated left ventricular end-diastolic pressure. Right Ventricle: The right ventricular size is mildly enlarged. Right vetricular wall thickness was not assessed. Right ventricular systolic function is moderately reduced. There is moderately elevated pulmonary artery systolic pressure. The tricuspid regurgitant velocity is 3.31 m/s, and with an assumed right atrial pressure of 10 mmHg, the estimated right ventricular systolic pressure is 98.3 mmHg. Left Atrium: Left atrial size was moderately dilated. Right Atrium: Right atrial size was mildly dilated. Pericardium: There is no evidence of pericardial effusion. Mitral Valve: The mitral valve is normal in structure. Normal mobility of the mitral valve leaflets. No evidence of mitral valve regurgitation. No evidence of mitral valve stenosis. Tricuspid Valve: The tricuspid valve is normal in structure. Tricuspid valve regurgitation is mild . No evidence of tricuspid stenosis. Aortic Valve: The aortic valve is tricuspid. Aortic valve regurgitation is not visualized. No aortic stenosis is present. Pulmonic Valve: The pulmonic valve was normal in structure. Pulmonic valve regurgitation is mild. No evidence of  pulmonic stenosis. Aorta: The aortic root is normal in size and structure. Venous: The inferior vena cava is dilated in size with greater than 50% respiratory variability, suggesting right atrial pressure of 8 mmHg. IAS/Shunts: No atrial level shunt detected by color flow Doppler.  LEFT VENTRICLE PLAX 2D LVIDd:         5.43 cm  Diastology LVIDs:         4.63 cm  LV e' lateral:   4.80 cm/s LV PW:         1.29 cm  LV E/e' lateral: 22.7 LV IVS:        1.40 cm  LV e' medial:    2.40 cm/s LVOT diam:     1.90 cm  LV E/e' medial:  45.4 LVOT Area:     2.84 cm  RIGHT VENTRICLE  RV S prime:     9.45 cm/s TAPSE (M-mode): 1.9 cm LEFT ATRIUM              Index       RIGHT ATRIUM           Index LA diam:        4.40 cm  1.99 cm/m  RA Area:     23.10 cm LA Vol (A2C):   100.0 ml 45.23 ml/m RA Volume:   82.40 ml  37.27 ml/m LA Vol (A4C):   75.1 ml  33.96 ml/m LA Biplane Vol: 87.7 ml  39.66 ml/m   AORTA Ao Root diam: 2.80 cm MITRAL VALVE                TRICUSPID VALVE MV Area (PHT): 4.80 cm     TR Peak grad:   43.8 mmHg MV Decel Time: 158 msec     TR Vmax:        331.00 cm/s MR Peak grad: 46.5 mmHg MR Mean grad: 32.0 mmHg     SHUNTS MR Vmax:      341.00 cm/s   Systemic Diam: 1.90 cm MR Vmean:     269.0 cm/s MV E velocity: 109.00 cm/s MV A velocity: 23.80 cm/s MV E/A ratio:  4.58 Jenkins Rouge MD Electronically signed by Jenkins Rouge MD Signature Date/Time: 07/03/2019/11:41:20 AM    Final         Scheduled Meds: . amiodarone  200 mg Oral BID  . apixaban  5 mg Oral BID  . furosemide  80 mg Intravenous Q12H  . gabapentin  200 mg Oral TID  . insulin aspart  0-15 Units Subcutaneous TID WC  . insulin aspart  0-5 Units Subcutaneous QHS  . metoprolol succinate  25 mg Oral Daily  . nicotine  7 mg Transdermal Daily  . potassium chloride SA  40 mEq Oral BID  . sodium chloride flush  3 mL Intravenous Q12H   Continuous Infusions: . sodium chloride    . magnesium sulfate bolus IVPB 2 g (07/05/19 0842)     LOS: 3 days      Time spent: 30 minutes    Theron Cumbie D Manuella Ghazi, DO [facility]  If 7PM-7AM, please contact night-coverage [email or web address] 07/05/2019, 9:30 AM

## 2019-07-05 NOTE — Progress Notes (Signed)
Ambulated approximately 129ft on room air, sats maintained at 95-97% no SOB noted. Dr. Manuella Ghazi made aware.

## 2019-07-05 NOTE — Progress Notes (Addendum)
Progress Note  Patient Name: [name] [name] Date of Encounter: 07/05/2019  Primary Cardiologist: Carlyle Dolly, MD   Subjective   Breathing improved but he did require supplemental oxygen yesterday after talking for an extended period of time. No chest pain or palpitations. Unaware of his recurrent atrial fibrillation this AM.   Inpatient Medications    Scheduled Meds:  amiodarone  200 mg Oral BID   furosemide  80 mg Intravenous Q12H   gabapentin  200 mg Oral TID   insulin aspart  0-15 Units Subcutaneous TID WC   insulin aspart  0-5 Units Subcutaneous QHS   metoprolol succinate  12.5 mg Oral Daily   nicotine  7 mg Transdermal Daily   potassium chloride SA  40 mEq Oral BID   sodium chloride flush  3 mL Intravenous Q12H   Continuous Infusions:  sodium chloride     magnesium sulfate bolus IVPB     PRN Meds: sodium chloride, acetaminophen, ondansetron (ZOFRAN) IV, oxyCODONE, sodium chloride flush   Vital Signs    Vitals:   07/04/19 1427 07/04/19 1958 07/04/19 2107 07/05/19 0546  BP: 120/88  115/76 126/85  Pulse: 80  81 85  Resp:   18   Temp: 97.8 F (36.6 C)  97.8 F (36.6 C) 98.4 F (36.9 C)  TempSrc: Oral  Oral Oral  SpO2: 99% (!) 86% 100% 99%  Weight:    95.8 kg  Height:        Intake/Output Summary (Last 24 hours) at 07/05/2019 0749 Last data filed at 07/05/2019 0600 Gross per 24 hour  Intake 600 ml  Output 2600 ml  Net -2000 ml    Last 3 Weights 07/05/2019 07/04/2019 07/03/2019  Weight (lbs) 211 lb 3.2 oz 213 lb 10 oz 218 lb 4.1 oz  Weight (kg) 95.8 kg 96.9 kg 99 kg      Telemetry    Atrial fibrillation overnight with HR in the 80's to 90's. Occasional PVC's.  - Personally Reviewed  ECG    No new tracings.   Physical Exam   General: Well developed, well nourished, male appearing in no acute distress. Head: Normocephalic, atraumatic.  Neck: Supple without bruits, JVD not elevated. Lungs:  Resp regular and unlabored, decreased breath sounds  along bases bilaterally. Heart: Irregularly irregular, S1, S2, no S3, S4, or murmur; no rub. Abdomen: Soft, non-tender, non-distended with normoactive bowel sounds. No hepatomegaly. No rebound/guarding. No obvious abdominal masses. Extremities: No clubbing or cyanosis, 1+ pitting edema bilaterally. Distal pedal pulses are 2+ bilaterally. Neuro: Alert and oriented X 3. Moves all extremities spontaneously. Psych: Normal affect.  Labs    Chemistry Recent Labs  Lab 07/02/19 1130 07/02/19 1130 07/03/19 0614 07/04/19 0744 07/05/19 0537  NA 139   < > 142 142 140  K 4.0   < > 3.4* 3.4* 3.4*  CL 101   < > 103 101 100  CO2 25   < > 29 27 28   GLUCOSE 168*   < > 107* 108* 136*  BUN 41*   < > 32* 24* 22  CREATININE 1.73*   < > 1.28* 1.17 1.06  CALCIUM 7.4*   < > 7.4* 8.2* 8.5*  PROT 6.7  --  6.2*  --  7.2  ALBUMIN 3.5  --  3.1*  --  3.5  AST 117*  --  64*  --  27  ALT 617*  --  448*  --  252*  ALKPHOS 100  --  82  --  96  BILITOT 0.4  --  1.3*  --  1.5*  GFRNONAA 41*   < > 60* >60 >60  GFRAA 48*   < > >60 >60 >60  ANIONGAP 13   < > 10 14 12    < > = values in this interval not displayed.     Hematology Recent Labs  Lab 07/02/19 1130 07/03/19 0614 07/04/19 0534  WBC 6.5 5.4 6.5  RBC 3.68* 3.55* 3.72*  HGB 12.4* 11.8* 12.6*  HCT 38.0* 36.7* 39.0  MCV 103.3* 103.4* 104.8*  MCH 33.7 33.2 33.9  MCHC 32.6 32.2 32.3  RDW 14.7 14.7 14.9  PLT 198 182 209    Cardiac EnzymesNo results for input(s): TROPONINI in the last 168 hours. No results for input(s): TROPIPOC in the last 168 hours.   BNP Recent Labs  Lab 07/02/19 1130  BNP 2,735.0*     DDimer No results for input(s): DDIMER in the last 168 hours.   Radiology    ECHOCARDIOGRAM COMPLETE  Result Date: 07/03/2019    ECHOCARDIOGRAM REPORT   Patient Name:   CHOYA TORNOW Reza Date of Exam: 07/03/2019 Medical Rec #:  [id number]        Height:       72.0 in Accession #:    [id number]       Weight:       218.3 lb Date of Birth:   20-Apr-1955       BSA:          2.211 m Patient Age:    34 years         BP:           101/77 mmHg Patient Gender: M                HR:           75 bpm. Exam Location:  [facility] Procedure: 2D Echo Indications:    Cardiomyopathy-Unspecified 425.9 / I42.9  History:        Patient has prior history of Echocardiogram examinations, most                 recent 04/24/2019. CHF, Arrythmias:Atrial Fibrillation; Risk                 Factors:Hypertension, Diabetes, Current Smoker and Dyslipidemia.                 Thrombus in heart chamber, Chronic renal failure.  Sonographer:    Leavy Cella RDCS (AE) Referring Phys: [phone] Royanne Foots [place]  1. No LV thrombus noted . Left ventricular ejection fraction, by estimation, is 25 to 30%. The left ventricle has severely decreased function. The left ventricle demonstrates global hypokinesis. The left ventricular internal cavity size was moderately dilated. There is mild left ventricular hypertrophy. Left ventricular diastolic parameters are consistent with Grade II diastolic dysfunction (pseudonormalization). Elevated left ventricular end-diastolic pressure.  2. Right ventricular systolic function is moderately reduced. The right ventricular size is mildly enlarged. There is moderately elevated pulmonary artery systolic pressure.  3. Left atrial size was moderately dilated.  4. Right atrial size was mildly dilated.  5. The mitral valve is normal in structure. No evidence of mitral valve regurgitation. No evidence of mitral stenosis.  6. The aortic valve is tricuspid. Aortic valve regurgitation is not visualized. No aortic stenosis is present.  7. The inferior vena cava is dilated in size with >50% respiratory variability, suggesting right atrial pressure of 8 mmHg. FINDINGS  Left Ventricle:  No LV thrombus noted. Left ventricular ejection fraction, by estimation, is 25 to 30%. The left ventricle has severely decreased function. The left ventricle demonstrates global  hypokinesis. The left ventricular internal cavity size was moderately dilated. There is mild left ventricular hypertrophy. Left ventricular diastolic parameters are consistent with Grade II diastolic dysfunction (pseudonormalization). Elevated left ventricular end-diastolic pressure. Right Ventricle: The right ventricular size is mildly enlarged. Right vetricular wall thickness was not assessed. Right ventricular systolic function is moderately reduced. There is moderately elevated pulmonary artery systolic pressure. The tricuspid regurgitant velocity is 3.31 m/s, and with an assumed right atrial pressure of 10 mmHg, the estimated right ventricular systolic pressure is 59.7 mmHg. Left Atrium: Left atrial size was moderately dilated. Right Atrium: Right atrial size was mildly dilated. Pericardium: There is no evidence of pericardial effusion. Mitral Valve: The mitral valve is normal in structure. Normal mobility of the mitral valve leaflets. No evidence of mitral valve regurgitation. No evidence of mitral valve stenosis. Tricuspid Valve: The tricuspid valve is normal in structure. Tricuspid valve regurgitation is mild . No evidence of tricuspid stenosis. Aortic Valve: The aortic valve is tricuspid. Aortic valve regurgitation is not visualized. No aortic stenosis is present. Pulmonic Valve: The pulmonic valve was normal in structure. Pulmonic valve regurgitation is mild. No evidence of pulmonic stenosis. Aorta: The aortic root is normal in size and structure. Venous: The inferior vena cava is dilated in size with greater than 50% respiratory variability, suggesting right atrial pressure of 8 mmHg. IAS/Shunts: No atrial level shunt detected by color flow Doppler.  LEFT VENTRICLE PLAX 2D LVIDd:         5.43 cm  Diastology LVIDs:         4.63 cm  LV e' lateral:   4.80 cm/s LV PW:         1.29 cm  LV E/e' lateral: 22.7 LV IVS:        1.40 cm  LV e' medial:    2.40 cm/s LVOT diam:     1.90 cm  LV E/e' medial:  45.4 LVOT  Area:     2.84 cm  RIGHT VENTRICLE RV S prime:     9.45 cm/s TAPSE (M-mode): 1.9 cm LEFT ATRIUM              Index       RIGHT ATRIUM           Index LA diam:        4.40 cm  1.99 cm/m  RA Area:     23.10 cm LA Vol (A2C):   100.0 ml 45.23 ml/m RA Volume:   82.40 ml  37.27 ml/m LA Vol (A4C):   75.1 ml  33.96 ml/m LA Biplane Vol: 87.7 ml  39.66 ml/m   AORTA Ao Root diam: 2.80 cm MITRAL VALVE                TRICUSPID VALVE MV Area (PHT): 4.80 cm     TR Peak grad:   43.8 mmHg MV Decel Time: 158 msec     TR Vmax:        331.00 cm/s MR Peak grad: 46.5 mmHg MR Mean grad: 32.0 mmHg     SHUNTS MR Vmax:      341.00 cm/s   Systemic Diam: 1.90 cm MR Vmean:     269.0 cm/s MV E velocity: 109.00 cm/s MV A velocity: 23.80 cm/s MV E/A ratio:  4.58 Jenkins Rouge MD Electronically signed by Jenkins Rouge MD  Signature Date/Time: 07/03/2019/11:41:20 AM    Final     Cardiac Studies   Echocardiogram: 06/2019 IMPRESSIONS     1. No LV thrombus noted . Left ventricular ejection fraction, by  estimation, is 25 to 30%. The left ventricle has severely decreased  function. The left ventricle demonstrates global hypokinesis. The left  ventricular internal cavity size was moderately  dilated. There is mild left ventricular hypertrophy. Left ventricular  diastolic parameters are consistent with Grade II diastolic dysfunction  (pseudonormalization). Elevated left ventricular end-diastolic pressure.   2. Right ventricular systolic function is moderately reduced. The right  ventricular size is mildly enlarged. There is moderately elevated  pulmonary artery systolic pressure.   3. Left atrial size was moderately dilated.   4. Right atrial size was mildly dilated.   5. The mitral valve is normal in structure. No evidence of mitral valve  regurgitation. No evidence of mitral stenosis.   6. The aortic valve is tricuspid. Aortic valve regurgitation is not  visualized. No aortic stenosis is present.   7. The inferior vena cava  is dilated in size with >50% respiratory  variability, suggesting right atrial pressure of 8 mmHg.   Patient Profile     64 y.o. male w/ PMH of chronic combined systolic and diastolic CHF/NICM (EF 38% in 2013 with cath showing normal cors, EF 30-35% by repeat echo in 2015 with no outpatient follow-up from 2016 to 04/2019, EF at 25-30% by echo in 04/2019), Apical thrombus (diagnosed by echocardiogram in 04/2019), paroxysmal atrial fibrillation, HTN, HLD, Type 2 DM and Stage 3 CKD who is currently admitted for an acute CHF exacerbation.   Assessment & Plan      1. Acute on Chronic Combined Systolic and Diastolic CHF/NICM - EF was previously 15% in 2013 with cath showing normal cors, EF 30-35% by repeat echo in 2015 with no outpatient follow-up from 2016 to 04/2019, EF at 25-30% by echo in 04/2019. Repeat this admission unchanged with EF at 25-30%. Discussed with Dr. Johnsie Cancel and no plans for repeat cath at this time. Can consider an outpatient Lexiscan Myoview for repeat ischemic evaluation.  - he remains on IV Lasix 80mg  BID with a recorded net output of -6.7 L thus far and weight has declined from 225 to 211 lbs which is below his previous baseline. Continue with IV Lasix today given residual volume overload.  - will titrate Toprol-XL from 12.5mg  daily to 25mg  daily given recurrence of atrial fibrillation and HR in the 90's at rest. Would consider the addition of a low-dose ARB tomorrow if BP and renal function remain stable. Unsure if his BP will tolerate Entresto. Will ask nursing staff to check ambulatory oxygen saturations today.   2. Apical Thrombus - echocardiogram in 04/2019 showed a 1.0 x 0.92 cm apical thrombus that was semi mobile. Repeat this admission shows no evidence of LV thrombus. Coumadin has since been discontinued and he has been transitioned to a DOAC.    3. Persistent Atrial Fibrillation - he was started on Amiodarone 400mg  BID on 06/25/2019. Reduced to 200mg  BID on admission and  should remain on this for 2 weeks then 200mg  daily (on 07/17/2019).  - he did go back into atrial fibrillation overnight but rates have been in the 80's to 90's. Will titrate Toprol-XL from 12.5mg  daily to 25mg  daily (was on 37.5mg  daily as an outpatient).  - Coumadin has been discontinued given resolution of his apical thrombus with plans to start Eliquis once INR < 2.0. INR  1.6 this AM --> will write for Eliquis 5mg  BID.     4. NSVT - he had 39 beats NSVT on 6/15. Dr. Johnsie Cancel reviewed with EP and no indication for a Life Vest at this time. Will need to see EP as an outpatient for consideration of ICD placement but he has declined this in the past and remains unsure about proceeding with device placement at this time.  - No recurrence overnight. K+ 3.4 this AM with Mg down to 1.4. K-dur 40 mEq BID has been ordered by the admitting team along with additional Mg supplementation.    5. Elevated LFT's - AST 117 and ALT 617 on admission, at 27 and 252 respectively this AM. Likely due to hepatic congestion in the setting of his CHF exacerbation.    6. Stage 3 CKD - baseline creatinine ~ 1.6. Had peaked at 3.77 in the outpatient setting. Continues to improve with diuresis. At 1.06 this AM.     For questions or updates, please contact [facility] Please consult [email or web address] for contact info under Cardiology/STEMI.   Signed, Erma Heritage , PA-C 7:49 AM 07/05/2019 Pager: [phone]  Patient examined chart reviewed CHF improved and Cr normalized Good diuresis Discussed idea of AICD at some point and patient hesitant will see  Dr Lovena Le as outpatient In Afib this am with PVC;s No NSVT on amiodarone 200 bid Beta blocker increased continue diuresis and supplement K   Jenkins Rouge MD [facility]

## 2019-07-06 LAB — COMPREHENSIVE METABOLIC PANEL
ALT: 177 U/L — ABNORMAL HIGH (ref 0–44)
AST: 20 U/L (ref 15–41)
Albumin: 3.4 g/dL — ABNORMAL LOW (ref 3.5–5.0)
Alkaline Phosphatase: 89 U/L (ref 38–126)
Anion gap: 14 (ref 5–15)
BUN: 24 mg/dL — ABNORMAL HIGH (ref 8–23)
CO2: 27 mmol/L (ref 22–32)
Calcium: 8.6 mg/dL — ABNORMAL LOW (ref 8.9–10.3)
Chloride: 99 mmol/L (ref 98–111)
Creatinine, Ser: 1.15 mg/dL (ref 0.61–1.24)
GFR calc Af Amer: >60 mL/min (ref >60)
GFR calc non Af Amer: >60 mL/min (ref >60)
Glucose, Bld: 132 mg/dL — ABNORMAL HIGH (ref 70–99)
Potassium: 3.7 mmol/L (ref 3.5–5.1)
Sodium: 140 mmol/L (ref 135–145)
Total Bilirubin: 2 mg/dL — ABNORMAL HIGH (ref 0.3–1.2)
Total Protein: 7.2 g/dL (ref 6.5–8.1)

## 2019-07-06 LAB — GLUCOSE, CAPILLARY
Glucose-Capillary: 111 mg/dL — ABNORMAL HIGH (ref 70–99)
Glucose-Capillary: 222 mg/dL — ABNORMAL HIGH (ref 70–99)
Glucose-Capillary: 138 mg/dL — ABNORMAL HIGH (ref 70–99)
Glucose-Capillary: 162 mg/dL — ABNORMAL HIGH (ref 70–99)

## 2019-07-06 LAB — CBC
HCT: 41.3 % (ref 39.0–52.0)
Hemoglobin: 13.2 g/dL (ref 13.0–17.0)
MCH: 33.3 pg (ref 26.0–34.0)
MCHC: 32 g/dL (ref 30.0–36.0)
MCV: 104.3 fL — ABNORMAL HIGH (ref 80.0–100.0)
Platelets: 216 10*3/uL (ref 150–400)
RBC: 3.96 MIL/uL — ABNORMAL LOW (ref 4.22–5.81)
RDW: 15.2 % (ref 11.5–15.5)
WBC: 8.7 10*3/uL (ref 4.0–10.5)
nRBC: 0 % (ref 0.0–0.2)

## 2019-07-06 LAB — PROTIME-INR
INR: 1.8 — ABNORMAL HIGH (ref 0.8–1.2)
Prothrombin Time: 20.5 seconds — ABNORMAL HIGH (ref 11.4–15.2)

## 2019-07-06 LAB — MAGNESIUM: Magnesium: 1.6 mg/dL — ABNORMAL LOW (ref 1.7–2.4)

## 2019-07-06 MED ORDER — ALPRAZOLAM 0.25 MG PO TABS
0.2500 mg | ORAL_TABLET | Freq: Once | ORAL | Status: AC
  Administered 2019-07-06: 0.25 mg via ORAL
  Filled 2019-07-06: qty 1

## 2019-07-06 MED ORDER — MAGNESIUM OXIDE 400 (241.3 MG) MG PO TABS
200.0000 mg | ORAL_TABLET | Freq: Every day | ORAL | Status: DC
  Administered 2019-07-06 – 2019-07-07 (×2): 200 mg via ORAL
  Filled 2019-07-06 (×2): qty 1

## 2019-07-06 MED ORDER — FUROSEMIDE 10 MG/ML IJ SOLN
80.0000 mg | Freq: Three times a day (TID) | INTRAMUSCULAR | Status: DC
  Administered 2019-07-06 – 2019-07-07 (×2): 80 mg via INTRAVENOUS
  Filled 2019-07-06 (×2): qty 8

## 2019-07-06 MED ORDER — MAGNESIUM SULFATE 2 GM/50ML IV SOLN
2.0000 g | Freq: Once | INTRAVENOUS | Status: AC
  Administered 2019-07-06: 2 g via INTRAVENOUS
  Filled 2019-07-06: qty 50

## 2019-07-06 NOTE — Progress Notes (Signed)
**Note De-identified [name] [name] Obfuscation** EKG complete and placed in patient chart 

## 2019-07-06 NOTE — Progress Notes (Addendum)
Progress Note  Patient Name: [name] [name] Date of Encounter: 07/06/2019  Primary Cardiologist: Carlyle Dolly, MD   Subjective   No chest pain, still gets SOB w/ conversation but maintained O2 sats w/ ambulation  Inpatient Medications    Scheduled Meds: . amiodarone  200 mg Oral BID  . apixaban  5 mg Oral BID  . furosemide  80 mg Intravenous Q12H  . gabapentin  200 mg Oral TID  . insulin aspart  0-15 Units Subcutaneous TID WC  . insulin aspart  0-5 Units Subcutaneous QHS  . metoprolol succinate  25 mg Oral Daily  . nicotine  7 mg Transdermal Daily  . potassium chloride SA  40 mEq Oral BID  . sodium chloride flush  3 mL Intravenous Q12H   Continuous Infusions: . sodium chloride     PRN Meds: sodium chloride, acetaminophen, ondansetron (ZOFRAN) IV, oxyCODONE, sodium chloride flush   Vital Signs    Vitals:   07/05/19 2002 07/06/19 0557 07/06/19 0559 07/06/19 0802  BP: 101/77  (!) 131/91   Pulse: (!) 56  85   Resp:   16   Temp: 97.6 F (36.4 C)  98.1 F (36.7 C)   TempSrc:      SpO2: 100%  97% 96%  Weight:  95.2 kg    Height:        Intake/Output Summary (Last 24 hours) at 07/06/2019 1020 Last data filed at 07/06/2019 0900 Gross per 24 hour  Intake --  Output 1200 ml  Net -1200 ml    Last 3 Weights 07/06/2019 07/05/2019 07/04/2019  Weight (lbs) 209 lb 14.1 oz 211 lb 3.2 oz 213 lb 10 oz  Weight (kg) 95.2 kg 95.8 kg 96.9 kg      Telemetry    Atrial fibrillation overnight with HR in the 80's to 90's. Occasional PVC's.  - Personally Reviewed  ECG    No new tracings.   Physical Exam   GEN: No acute distress.   Neck: No JVD Cardiac: RRR, no murmur, no rubs, or gallops.  Respiratory: diminished to auscultation bilaterally with rales in the bases. GI: Soft, nontender, non-distended  MS: No edema; No deformity. Neuro:  Nonfocal  Psych: Normal affect    General: Well developed, well nourished, male appearing in no acute distress. Head:  Normocephalic, atraumatic.  Neck: Supple without bruits, JVD not elevated. Lungs:  Resp regular and unlabored, decreased breath sounds along bases bilaterally. Heart: Irregularly irregular, S1, S2, no S3, S4, or murmur; no rub. Abdomen: Soft, non-tender, non-distended with normoactive bowel sounds. No hepatomegaly. No rebound/guarding. No obvious abdominal masses. Extremities: No clubbing or cyanosis, 1+ pitting edema bilaterally. Distal pedal pulses are 2+ bilaterally. Neuro: Alert and oriented X 3. Moves all extremities spontaneously. Psych: Normal affect.  Labs    Chemistry Recent Labs  Lab 07/03/19 [phone] 07/03/19 0614 07/04/19 0744 07/05/19 0537 07/06/19 0619  NA 142   < > 142 140 140  K 3.4*   < > 3.4* 3.4* 3.7  CL 103   < > 101 100 99  CO2 29   < > 27 28 27   GLUCOSE 107*   < > 108* 136* 132*  BUN 32*   < > 24* 22 24*  CREATININE 1.28*   < > 1.17 1.06 1.15  CALCIUM 7.4*   < > 8.2* 8.5* 8.6*  PROT 6.2*  --   --  7.2 7.2  ALBUMIN 3.1*  --   --  3.5 3.4*  AST 64*  --   --  27 20  ALT 448*  --   --  252* 177*  ALKPHOS 82  --   --  96 89  BILITOT 1.3*  --   --  1.5* 2.0*  GFRNONAA 60*   < > >60 >60 >60  GFRAA >60   < > >60 >60 >60  ANIONGAP 10   < > 14 12 14    < > = values in this interval not displayed.    Magnesium  Date Value Ref Range Status  07/06/2019 1.6 (L) 1.7 - 2.4 mg/dL Final    Comment:    Performed at [facility], [address], [place], [place] [address]  07/05/2019 1.4 (L) 1.7 - 2.4 mg/dL Final    Comment:    Performed at [facility], [address], [place], [place] [address]  07/04/2019 1.7 1.7 - 2.4 mg/dL Final    Comment:    Performed at [facility], [address], [place],  [address]     Hematology Recent Labs  Lab 07/03/19 [phone] 07/04/19 0534 07/06/19 0619  WBC 5.4 6.5 8.7  RBC 3.55* 3.72* 3.96*  HGB 11.8* 12.6* 13.2  HCT 36.7* 39.0 41.3  MCV 103.4* 104.8* 104.3*  MCH 33.2 33.9 33.3  MCHC 32.2 32.3 32.0  RDW 14.7 14.9 15.2    PLT 182 209 216    Cardiac EnzymesNo results for input(s): TROPONINI in the last 168 hours. No results for input(s): TROPIPOC in the last 168 hours.   BNP Recent Labs  Lab 07/02/19 1130  BNP 2,735.0*      Radiology    No results found.  Cardiac Studies   Echocardiogram: 06/2019 IMPRESSIONS   1. No LV thrombus noted . Left ventricular ejection fraction, by  estimation, is 25 to 30%. The left ventricle has severely decreased  function. The left ventricle demonstrates global hypokinesis. The left  ventricular internal cavity size was moderately  dilated. There is mild left ventricular hypertrophy. Left ventricular  diastolic parameters are consistent with Grade II diastolic dysfunction  (pseudonormalization). Elevated left ventricular end-diastolic pressure.   2. Right ventricular systolic function is moderately reduced. The right  ventricular size is mildly enlarged. There is moderately elevated  pulmonary artery systolic pressure.   3. Left atrial size was moderately dilated.   4. Right atrial size was mildly dilated.   5. The mitral valve is normal in structure. No evidence of mitral valve  regurgitation. No evidence of mitral stenosis.   6. The aortic valve is tricuspid. Aortic valve regurgitation is not  visualized. No aortic stenosis is present.   7. The inferior vena cava is dilated in size with >50% respiratory  variability, suggesting right atrial pressure of 8 mmHg.   Patient Profile     64 y.o. male w/ PMH of chronic combined systolic and diastolic CHF/NICM (EF 28% in 2013 with cath showing normal cors, EF 30-35% by repeat echo in 2015 with no outpatient follow-up from 2016 to 04/2019, EF at 25-30% by echo in 04/2019), Apical thrombus (diagnosed by echocardiogram in 04/2019), paroxysmal atrial fibrillation, HTN, HLD, Type 2 DM and Stage 3 CKD who is currently admitted for an acute CHF exacerbation.   Assessment & Plan      1. Acute on Chronic Combined Systolic  and Diastolic CHF/NICM - EF 31% in 2013 w/ nl cors>>30-35% in 2015>>seen 04/2019 w/ EF 25-30% & +apical thrombus>>EF 25-30% this admit w/ grade 3 dd, no LV thrombus  - no plans for repeat cath, consider outpt Lexi MV -  on Lasix 80 mg IV bid since 06/11, K+ increasing on 40 meq bid, follow - Output -7.8 L but is incomplete, wt 225>>209 lbs - O2 sats good w/ ambulation (prev low) but he still gets SOB w/ conversation - not on O2 pta - dry wt unclear, but says he has scales and can weigh at home - BB increased this admit, tolerated well - will order nutrition consult - suggestion was to start ARB today - however, when torsemide was increased to 40 mg qd and pt was already on losartan 12.5 mg qd, Cr went to 3.77. Dr Harl Bowie felt this was 2nd to cardiorenal syndrome, venous congestion and low output. Losartan and torsemide stopped 06/11   2. Apical Thrombus - seen on 04/2019 echo - not seen on 06/2019 echo - was on coumadin, now on Eliquis   3. Persistent Atrial Fibrillation - amio titrated from 400 mg bid (06/07)>>200 mg bid (06/14)>>200 mg qd on 06/29 - converted back to Afib on 06/17, rate is controlled - Toprol XL titrated from 12.5 mg qd>>25 mg qd, was on 37.5 mg qd at home but not sure BP will tolerate - since thrombus resolved, coumadin d/c'd, now on Eliquis   4. NSVT - 32 bts 06/15, per EP, no indication for Life Vest, f/u w/ EP as outpt but he has declined ICD in the past - K+ 3.4 >>supplemented, also on oral rx - Mg 1.6>> supplemented, will start oral rx   5. Elevated LFT's - trending down, felt 2nd hepatic congestion  6. Stage 3 CKD - Cr prev 1.06, went to 3.77 on Torsemide 40 mg qd and losartan 12.5 mg qd - both meds held and Cr improved - so far, no significant Cr change w/ diuresis, continue to follow   For questions or updates, please contact [facility] Please consult [email or web address] for contact info under Cardiology/STEMI.   Signed, Rosaria Ferries , PA-C 10:20  AM 07/06/2019   Pt seen and examined  I agree with findings as noted above by R Barrett.    PT says his breathing is still not back to baseline  On exam: Neck:  JVP is increased Lungs are rel clear to auscultation Cardiac RRR  No S3 Ext with 1+ edema  Acute on chronic systolic / diastolic CHF  I would continue diuresis  With intravenous lasix    Still with volume increase on exam  Renal function is OK    I would increase to tid dosing and follow outpt   Labs in AM   Add BNP  If volume improves on weekend and pt comfortable, breathing back to baseline OK to send home  WOuld probably make torsemide 40 AM   / 20 PM    Give  54meq KCL each day Weigh daily   Will make sure he has f/u in clnic for labs     Dorris Carnes MD

## 2019-07-06 NOTE — Care Management Important Message (Signed)
Important Message  Patient Details  Name: [name] [name] MRN: [id number] Date of Birth: 03-23-1955   Medicare Important Message Given:  Yes     Tommy Medal 07/06/2019, 4:28 PM

## 2019-07-06 NOTE — Plan of Care (Signed)
Nutrition Education Note  RD consulted for nutrition education regarding CHF.  RD provided "Low Sodium Nutrition Therapy" handout from the [employer]. Reviewed patient's dietary recall. Provided examples on ways to decrease sodium intake in diet. Discouraged intake of processed foods and use of salt shaker. Encouraged fresh fruits and vegetables as well as whole grain sources of carbohydrates to maximize fiber intake.   RD discussed why it is important for patient to adhere to diet recommendations, and emphasized the role of fluids, foods to avoid, and importance of weighing self daily. Teach back method used.  Expect good compliance.  Body mass index is 28.46 kg/m. Pt meets criteria for overweight based on current BMI.  Current diet order is HH/CM/1500 ml, patient is consuming approximately 75-100% of meals at this time. Labs and medications reviewed. No further nutrition interventions warranted at this time. RD contact information provided. If additional nutrition issues arise, please re-consult RD.   Lajuan Lines, RD, LDN Clinical Nutrition After Hours/Weekend Pager # in [place]

## 2019-07-06 NOTE — Progress Notes (Signed)
PROGRESS NOTE    [name] [name]  JSH:[id number] DOB: 01/22/1955 DOA: 07/02/2019 PCP: Lucia Gaskins, MD   Brief Narrative:  As per H&P written by Dr. Manuella Ghazi on 07/02/2019 64 y.o.malewith medical history significant forsystolic congestive heart failure with LVEF 30-35% on echocardiogram in 2015, LV thrombus currently on Coumadin, ongoing tobacco abuse, history of atrial fibrillation, hypertension, and type 2 diabetes with diabetic neuropathy who presented to the ED after being seen in the cardiology clinic earlier today by his cardiologist Dr. Harl Bowie. He was noted to be volume overloaded with an approximately 13 pound weight gain since 04/2019. He did not appear to tolerate increasing doses of torsemide at home and was noted to have severe AKI with creatinine up to 3.77 for which torsemide was held. Since he was noted to have difficulty with diuresis in the outpatient setting, he was referred to the ED for admission for inpatient diuresis and close monitoring as well as repeat 2D echocardiogram.He denies any chest pain or shortness of breath. No orthopnea or paroxysmal nocturnal dyspnea noted. No fevers,or chills, or cough.  ED Course:Stable vital signs noted. And creatinine currently 1.73 which is near his baseline. He is noted to have some mild transaminitis with AST 117 and ALT 617. BNP is 2735. Two-view chest x-ray with some cardiomegaly and right-sided pleural effusion and interstitial edema noted. Covid testing negative. IV Lasix has been ordered and pending.  -He continues to diurese quite well and has been increased to IV Lasix 3 times daily dosing on 6/18 by cardiology.  Continue to follow a.m. labs.  Assessment & Plan:   Active Problems:   Acute on chronic systolic (congestive) heart failure (HCC)   1-Acute on chronic systolic (congestive) heart failure ([place]) -lastEjection fraction30-35% on 04/2019 -2D echorepeated with LVEF 25-30% during this admission -good  urine output appreciated with diuresis ongoing as noted above. -continue current IV diuresisas ordered, now TID -follow renal function and electrolytes, replete potassium and magnesium today -followdaily weights and strict I's and O's -long discussion about low sodium diet  -Appreciate cardiology recommendations ongoing -Toprol-XL from 12.5 mg daily to 25 mg daily -Wean off nasal cannula as tolerated.  2-hx of persistent atrial fibrillation -stable but with recurrence and therefore Toprol-XL to 25 mg in 6/17 -Continue amiodarone -Eliquis 5 mg twice daily started today as INR is 1.6  3-HTN -soft on admission, but improved -metoprolol dose to increase as noted above -stable currently -continue IV lasix for ongoing diuresis with noted volume overload -follow rec's by cardiology for further heart meds resumption  4-LV thrombus -Not present on echo this admission -Coumadin DC and Eliquis started 6/17  5-acute on chronic renal failure (stage 3a)  -continue avoiding hypotension and minimizingnephrotoxic agents -follow renal function trend -follow cardiology service rec's for recommended diuretics.  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 Lovena Le EP -Further invasive vs noninvasive imaging per Cardiology  12-CKD stage III -Baseline creatinine approximately 1.6 -Stable at 1.15  this a.m.   DVT prophylaxis:Eliquis Code Status:Full code Family Communication:No family at bedside. Disposition:  Status  is: Inpatient  Dispo: The patient is from:Home Anticipated d/c is EG:BTDV Anticipated d/c date is:1-2 daysbased on further improvement/stability of his heart failure exacerbation. Patient currently not medically stable for discharge,still short winded with activity and having some orthopnea. Fluid overload on examination. Feeling better and with good ongoing diuresis with current treatment. Follow cardiology service recommendations. No chest pain or palpitations.  Consultants:  Cardiology service.   Procedures: See below for x-ray reports.   Antimicrobials: None  Subjective: Patient seen and evaluated today with no new acute complaints or concerns. No acute concerns or events noted overnight.  Objective: Vitals:   07/06/19 0557 07/06/19 0559 07/06/19 0802 07/06/19 1351  BP:  (!) 131/91  112/89  Pulse:  85  91  Resp:  16  16  Temp:  98.1 F (36.7 C)  98.2 F (36.8 C)  TempSrc:    Oral  SpO2:  97% 96% 93%  Weight: 95.2 kg     Height:        Intake/Output Summary (Last 24 hours) at 07/06/2019 1415 Last data filed at 07/06/2019 0900 Gross per 24 hour  Intake --  Output 1200 ml  Net -1200 ml   Filed Weights   07/04/19 0600 07/05/19 0546 07/06/19 0557  Weight: 96.9 kg 95.8 kg 95.2 kg    Examination:  General exam: Appears calm and comfortable  Respiratory system: Clear to auscultation. Respiratory effort normal. Currently on 2L [place]. Cardiovascular system: S1 & S2 heard, RRR. No JVD, murmurs, rubs, gallops or clicks. No pedal edema. Gastrointestinal system: Abdomen is nondistended, soft and nontender. No organomegaly or masses felt. Normal bowel sounds heard. Central nervous system: Alert and oriented. No focal neurological deficits. Extremities: Symmetric 5 x 5 power. Skin: No  rashes, lesions or ulcers Psychiatry: Judgement and insight appear normal. Mood & affect appropriate.     Data Reviewed: I have personally reviewed following labs and imaging studies  CBC: Recent Labs  Lab 07/02/19 1130 07/03/19 0614 07/04/19 0534 07/06/19 0619  WBC 6.5 5.4 6.5 8.7  NEUTROABS 4.4 3.3  --   --   HGB 12.4* 11.8* 12.6* 13.2  HCT 38.0* 36.7* 39.0 41.3  MCV 103.3* 103.4* 104.8* 104.3*  PLT 198 182 209 761   Basic Metabolic Panel: Recent Labs  Lab 07/02/19 1130 07/03/19 0614 07/04/19 0744 07/05/19 0537 07/06/19 0619  NA 139 142 142 140 140  K 4.0 3.4* 3.4* 3.4* 3.7  CL 101 103 101 100 99  CO2 25 29 27 28 27   GLUCOSE 168* 107* 108* 136* 132*  BUN 41* 32* 24* 22 24*  CREATININE 1.73* 1.28* 1.17 1.06 1.15  CALCIUM 7.4* 7.4* 8.2* 8.5* 8.6*  MG  --  1.4* 1.7 1.4* 1.6*   GFR: Estimated Creatinine Clearance: 79.7 mL/min (by C-G formula based on SCr of 1.15 mg/dL). Liver Function Tests: Recent Labs  Lab 07/02/19 1130 07/03/19 0614 07/05/19 0537 07/06/19 0619  AST 117* 64* 27 20  ALT 617* 448* 252* 177*  ALKPHOS 100 82 96 89  BILITOT 0.4 1.3* 1.5* 2.0*  PROT 6.7 6.2* 7.2 7.2  ALBUMIN 3.5 3.1* 3.5 3.4*   No results for input(s): LIPASE, AMYLASE in the last 168 hours. No results for input(s): AMMONIA in the last 168 hours. Coagulation Profile: Recent Labs  Lab 07/02/19 1130 07/03/19 0752 07/04/19 0534 07/05/19 0537 07/06/19 0619  INR 5.5* 3.0* 2.3* 1.6* 1.8*   Cardiac Enzymes: No results for input(s): CKTOTAL, CKMB, CKMBINDEX, TROPONINI in the last 168 hours. BNP (last 3  results) No results for input(s): PROBNP in the last 8760 hours. HbA1C: No results for input(s): HGBA1C in the last 72 hours. CBG: Recent Labs  Lab 07/05/19 1125 07/05/19 1638 07/05/19 2106 07/06/19 0730 07/06/19 1056  GLUCAP 180* 96 178* 111* 222*   Lipid Profile: No results for input(s): CHOL, HDL, LDLCALC, TRIG, CHOLHDL, LDLDIRECT in the last 72 hours. Thyroid  Function Tests: No results for input(s): TSH, T4TOTAL, FREET4, T3FREE, THYROIDAB in the last 72 hours. Anemia Panel: No results for input(s): VITAMINB12, FOLATE, FERRITIN, TIBC, IRON, RETICCTPCT in the last 72 hours. Sepsis Labs: No results for input(s): PROCALCITON, LATICACIDVEN in the last 168 hours.  Recent Results (from the past 240 hour(s))  SARS Coronavirus 2 by RT PCR (hospital order, performed in [facility] hospital lab) Nasopharyngeal Nasopharyngeal Swab     Status: None   Collection Time: 07/02/19 11:57 AM   Specimen: Nasopharyngeal Swab  Result Value Ref Range Status   SARS Coronavirus 2 NEGATIVE NEGATIVE Final    Comment: (NOTE) SARS-CoV-2 target nucleic acids are NOT DETECTED.  The SARS-CoV-2 RNA is generally detectable in upper and lower respiratory specimens during the acute phase of infection. The lowest concentration of SARS-CoV-2 viral copies this assay can detect is 250 copies / mL. A negative result does not preclude SARS-CoV-2 infection and should not be used as the sole basis for treatment or other patient management decisions.  A negative result may occur with improper specimen collection / handling, submission of specimen other than nasopharyngeal swab, presence of viral mutation(s) within the areas targeted by this assay, and inadequate number of viral copies (<250 copies / mL). A negative result must be combined with clinical observations, patient history, and epidemiological information.  Fact Sheet for Patients:   StrictlyIdeas.no  Fact Sheet for Healthcare Providers: BankingDealers.co.za  This test is not yet approved or  cleared by the [place] FDA and has been authorized for detection and/or diagnosis of SARS-CoV-2 by FDA under an Emergency Use Authorization (EUA).  This EUA will remain in effect (meaning this test can be used) for the duration of the COVID-19 declaration under Section 564(b)(1)  of the Act, 21 U.S.C. section 360bbb-3(b)(1), unless the authorization is terminated or revoked sooner.  Performed at [facility], [address] [place], [place] [address]          Radiology Studies: No results found.      Scheduled Meds: . amiodarone  200 mg Oral BID  . apixaban  5 mg Oral BID  . furosemide  80 mg Intravenous TID  . gabapentin  200 mg Oral TID  . insulin aspart  0-15 Units Subcutaneous TID WC  . insulin aspart  0-5 Units Subcutaneous QHS  . metoprolol succinate  25 mg Oral Daily  . nicotine  7 mg Transdermal Daily  . potassium chloride SA  40 mEq Oral BID  . sodium chloride flush  3 mL Intravenous Q12H   Continuous Infusions: . sodium chloride       LOS: 4 days    Time spent: 30 minutes    Tomeeka Plaugher Darleen Crocker, DO [facility]  If 7PM-7AM, please contact night-coverage [email or web address] 07/06/2019, 2:15 PM

## 2019-07-07 LAB — COMPREHENSIVE METABOLIC PANEL
ALT: 130 U/L — ABNORMAL HIGH (ref 0–44)
AST: 17 U/L (ref 15–41)
Albumin: 3.4 g/dL — ABNORMAL LOW (ref 3.5–5.0)
Alkaline Phosphatase: 84 U/L (ref 38–126)
Anion gap: 14 (ref 5–15)
BUN: 33 mg/dL — ABNORMAL HIGH (ref 8–23)
CO2: 24 mmol/L (ref 22–32)
Calcium: 8.8 mg/dL — ABNORMAL LOW (ref 8.9–10.3)
Chloride: 100 mmol/L (ref 98–111)
Creatinine, Ser: 1.24 mg/dL (ref 0.61–1.24)
GFR calc Af Amer: >60 mL/min (ref >60)
GFR calc non Af Amer: >60 mL/min (ref >60)
Glucose, Bld: 141 mg/dL — ABNORMAL HIGH (ref 70–99)
Potassium: 3.9 mmol/L (ref 3.5–5.1)
Sodium: 138 mmol/L (ref 135–145)
Total Bilirubin: 1.6 mg/dL — ABNORMAL HIGH (ref 0.3–1.2)
Total Protein: 7.1 g/dL (ref 6.5–8.1)

## 2019-07-07 LAB — PROTIME-INR
INR: 1.7 — ABNORMAL HIGH (ref 0.8–1.2)
Prothrombin Time: 19.5 seconds — ABNORMAL HIGH (ref 11.4–15.2)

## 2019-07-07 LAB — MAGNESIUM: Magnesium: 1.9 mg/dL (ref 1.7–2.4)

## 2019-07-07 LAB — GLUCOSE, CAPILLARY
Glucose-Capillary: 142 mg/dL — ABNORMAL HIGH (ref 70–99)
Glucose-Capillary: 206 mg/dL — ABNORMAL HIGH (ref 70–99)

## 2019-07-07 MED ORDER — MAGNESIUM OXIDE -MG SUPPLEMENT 200 MG PO TABS: 200.0000 mg | ORAL_TABLET | Freq: Every day | ORAL | 0 refills | Status: AC

## 2019-07-07 MED ORDER — ALPRAZOLAM 0.25 MG PO TABS
0.2500 mg | ORAL_TABLET | Freq: Three times a day (TID) | ORAL | Status: DC | PRN
  Administered 2019-07-07: 0.25 mg via ORAL
  Filled 2019-07-07: qty 1

## 2019-07-07 MED ORDER — APIXABAN 5 MG PO TABS: 5.0000 mg | ORAL_TABLET | Freq: Two times a day (BID) | ORAL | 3 refills | Status: DC

## 2019-07-07 MED ORDER — ALPRAZOLAM 0.25 MG PO TABS: 0.2500 mg | ORAL_TABLET | Freq: Three times a day (TID) | ORAL | 0 refills | Status: DC | PRN

## 2019-07-07 MED ORDER — TORSEMIDE 20 MG PO TABS: ORAL_TABLET | ORAL | 3 refills | Status: DC

## 2019-07-07 NOTE — Progress Notes (Signed)
Patient c/o anxiety during shift.  Patient states he has been having anxiety/ panic attacks for the last few days.  Patient states even though he is losing fluids, he feels as if he can't breathe sometimes and he becomes very nervous.  Patient states in the past he was prescribed anti anxiety medication.  Notified mid-level of patient anxiety.  Received one time order for medication for the night.  Patient experienced relief and stated that medication helped him to relax and calm him and breathe easier.

## 2019-07-07 NOTE — Progress Notes (Signed)
Nsg Discharge Note  Admit Date:  07/02/2019 Discharge date: 07/07/2019   [name] to be D/C'd Home per MD order.  AVS completed. Patient able to verbalize understanding.  Discharge Medication: Allergies as of 07/07/2019   No Known Allergies     Medication List    STOP taking these medications   gabapentin 100 MG capsule Commonly known as: NEURONTIN   warfarin 2.5 MG tablet Commonly known as: COUMADIN     TAKE these medications   ALPRAZolam 0.25 MG tablet Commonly known as: XANAX Take 1 tablet (0.25 mg total) by mouth 3 (three) times daily as needed for anxiety.   amiodarone 200 MG tablet Commonly known as: Pacerone TAKE 400 MG TWICE DAILY FOR 1 WEEK THEN 200 MG TWICE DAILY FOR 2 WEEKS THEN TAKE 200 MG DAILY   apixaban 5 MG Tabs tablet Commonly known as: ELIQUIS Take 1 tablet (5 mg total) by mouth 2 (two) times daily.   losartan 25 MG tablet Commonly known as: COZAAR Take 0.5 tablets (12.5 mg total) by mouth daily.   Magnesium Oxide 200 MG Tabs Take 1 tablet (200 mg total) by mouth daily.   metFORMIN 500 MG tablet Commonly known as: GLUCOPHAGE Take 500 mg by mouth 2 (two) times daily.   metoprolol succinate 25 MG 24 hr tablet Commonly known as: TOPROL-XL Take 1.5 tablets (37.5 mg total) by mouth daily.   Oxycodone HCl 10 MG Tabs Take 10 mg by mouth 4 (four) times daily as needed.   potassium chloride SA 20 MEQ tablet Commonly known as: KLOR-CON Take 2 tablets (40 mEq total) by mouth daily.   simvastatin 40 MG tablet Commonly known as: ZOCOR Take 1 tablet (40 mg total) by mouth at bedtime.   torsemide 20 MG tablet Commonly known as: DEMADEX Take 40mg  PO in AM and 20mg  PO in PM. What changed:   how much to take  how to take this  when to take this  additional instructions       Discharge Assessment: Vitals:   07/06/19 2111 07/07/19 0515  BP:  (!) 117/97  Pulse: 90 74  Resp:  16  Temp:  98.6 F (37 C)  SpO2:  95%   Skin clean, dry  and intact without evidence of skin break down, no evidence of skin tears noted. IV catheter discontinued intact. Site without signs and symptoms of complications - no redness or edema noted at insertion site, patient denies c/o pain - only slight tenderness at site.  Dressing with slight pressure applied.  D/c Instructions-Education: Discharge instructions given to patient with verbalized understanding. D/c education completed with patient including follow up instructions, medication list, d/c activities limitations if indicated, with other d/c instructions as indicated by MD - patient able to verbalize understanding, all questions fully answered. Patient instructed to return to ED, call 911, or call MD for any changes in condition.  Patient escorted via [place], and D/C home via private auto.  Addam Goeller C, RN 07/07/2019 11:18 AM

## 2019-07-07 NOTE — Progress Notes (Signed)
Patient given information on CHF.

## 2019-07-07 NOTE — Discharge Summary (Signed)
Physician Discharge Summary  [name] [name] GQQ:[id number] DOB: 07-Mar-1955 DOA: 07/02/2019  PCP: Lucia Gaskins, MD  Admit date: 07/02/2019  Discharge date: 07/07/2019  Admitted From:Home  Disposition:  Home  Recommendations for Outpatient Follow-up:  1. Follow up with PCP in 1-2 weeks 2. Follow-up with Dr. Harl Bowie in office 1-2 weeks for reassessment, will message for scheduling 3. Continue now on torsemide 40 mg in a.m. and 20 mg in p.m. 4. Continue potassium supplement of 40 mEq daily as well as magnesium supplement as prescribed 5. Xanax 0.25 mg for 20 tablets and 0 refills given for anxiety control as needed 6. Coumadin now discontinued patient will remain on Eliquis with savings card given on discharge.  No further LV thrombus noted on repeat 2D echocardiogram.  Home Health: None  Equipment/Devices: None  Discharge Condition: Stable  CODE STATUS: Full  Diet recommendation: Heart Healthy/carb modified  Brief/Interim Summary: As per H&P written by Dr. Manuella Ghazi on 07/02/2019 64 y.o.malewith medical history significant forsystolic congestive heart failure with LVEF 30-35% on echocardiogram in 2015, LV thrombus currently on Coumadin, ongoing tobacco abuse, history of atrial fibrillation, hypertension, and type 2 diabetes with diabetic neuropathy who presented to the ED after being seen in the cardiology clinic earlier today by his cardiologist Dr. Harl Bowie. He was noted to be volume overloaded with an approximately 13 pound weight gain since 04/2019. He did not appear to tolerate increasing doses of torsemide at home and was noted to have severe AKI with creatinine up to 3.77 for which torsemide was held. Since he was noted to have difficulty with diuresis in the outpatient setting, he was referred to the ED for admission for inpatient diuresis and close monitoring as well as repeat 2D echocardiogram.He denies any chest pain or shortness of breath. No orthopnea or paroxysmal  nocturnal dyspnea noted. No fevers,or chills, or cough.  ED Course:Stable vital signs noted. And creatinine currently 1.73 which is near his baseline. He is noted to have some mild transaminitis with AST 117 and ALT 617. BNP is 2735. Two-view chest x-ray with some cardiomegaly and right-sided pleural effusion and interstitial edema noted. Covid testing negative. IV Lasix has been ordered and pending.  -Patient was admitted with acute on chronic combined systolic and diastolic congestive heart failure with LV EF 25-30% this admission with grade 3 diastolic dysfunction and no LV thrombus noted.  Patient has been transitioned from Coumadin to Eliquis as a result.  He may continue the rest of his home medications with stable creatinine levels noted on discharge, currently 1.24.  It is felt that the elevated creatinine that was noted in the outpatient setting was more than likely an error.  He has diuresed quite well over 9.5 L of fluid and has a discharge weight of 206 pounds.  He has no further shortness of breath or oxygen requirements and has no further edema.  He feels ready to go home and will remain now on torsemide 40 mg in a.m. and 20 mg in p.m. per cardiology recommendations.  He will also remain on 40 mEq of potassium each day and has been instructed to weigh himself daily and follow-up with cardiology as recommended in the next 1-2 weeks for reassessment.  No other acute events noted throughout the course of this admission and he is overall stable for discharge.  Discharge Diagnoses:  Active Problems:   Acute on chronic systolic (congestive) heart failure (HCC)  Principal discharge diagnosis: Acute hypoxemic respiratory failure secondary to acute on chronic combined systolic  and diastolic CHF exacerbation.  Discharge Instructions  Discharge Instructions    Diet - low sodium heart healthy   Complete by: As directed    Increase activity slowly   Complete by: As directed       Allergies as of 07/07/2019   No Known Allergies     Medication List    STOP taking these medications   gabapentin 100 MG capsule Commonly known as: NEURONTIN   warfarin 2.5 MG tablet Commonly known as: COUMADIN     TAKE these medications   ALPRAZolam 0.25 MG tablet Commonly known as: XANAX Take 1 tablet (0.25 mg total) by mouth 3 (three) times daily as needed for anxiety.   amiodarone 200 MG tablet Commonly known as: Pacerone TAKE 400 MG TWICE DAILY FOR 1 WEEK THEN 200 MG TWICE DAILY FOR 2 WEEKS THEN TAKE 200 MG DAILY   apixaban 5 MG Tabs tablet Commonly known as: ELIQUIS Take 1 tablet (5 mg total) by mouth 2 (two) times daily.   losartan 25 MG tablet Commonly known as: COZAAR Take 0.5 tablets (12.5 mg total) by mouth daily.   Magnesium Oxide 200 MG Tabs Take 1 tablet (200 mg total) by mouth daily.   metFORMIN 500 MG tablet Commonly known as: GLUCOPHAGE Take 500 mg by mouth 2 (two) times daily.   metoprolol succinate 25 MG 24 hr tablet Commonly known as: TOPROL-XL Take 1.5 tablets (37.5 mg total) by mouth daily.   Oxycodone HCl 10 MG Tabs Take 10 mg by mouth 4 (four) times daily as needed.   potassium chloride SA 20 MEQ tablet Commonly known as: KLOR-CON Take 2 tablets (40 mEq total) by mouth daily.   simvastatin 40 MG tablet Commonly known as: ZOCOR Take 1 tablet (40 mg total) by mouth at bedtime.   torsemide 20 MG tablet Commonly known as: DEMADEX Take 40mg  PO in AM and 20mg  PO in PM. What changed:   how much to take  how to take this  when to take this  additional instructions       Follow-up Information    Lucia Gaskins, MD Follow up in 1 week(s).   Specialty: Internal Medicine Contact information: [place] [address] [phone]        Arnoldo Lenis, MD Follow up in 1 week(s).   Specialty: Cardiology Contact information: [address] [place] [address] [phone]              No  Known Allergies  Consultations:  Cardiology   Procedures/Studies: DG Chest 2 View  Result Date: 07/02/2019 CLINICAL DATA:  64 year old male with lower extremity swelling for 3 weeks. Cough. Query edema. EXAM: CHEST - 2 VIEW COMPARISON:  Portable chest 04/23/2019 and earlier. FINDINGS: Cardiomegaly again suspected with stable cardiac silhouette. Normal lung volumes. Patchy right lung base opacity which on the lateral view is suspicious for pleural effusion partially tracking into the major fissure. Small volume of minor fissure fluid also. Pulmonary vascularity is increased compared to 2014 radiographs. No left pleural effusion, pneumothorax, or other confluent opacity. Visualized tracheal air column is within normal limits. No acute osseous abnormality identified. Negative visible bowel gas pattern. IMPRESSION: 1. Cardiomegaly with right lung base opacity favored due to pleural effusion partially tracking into the fissures. 2. Increased pulmonary vascularity compatible with mild or developing interstitial edema. Electronically Signed   By: Genevie Ann M.D.   On: 07/02/2019 12:12   ECHOCARDIOGRAM COMPLETE  Result Date: 07/03/2019    ECHOCARDIOGRAM  REPORT   Patient Name:   [name] [name] Date of Exam: 07/03/2019 Medical Rec #:  [id number]        Height:       72.0 in Accession #:    [id number]       Weight:       218.3 lb Date of Birth:  04/14/1955       BSA:          2.211 m Patient Age:    21 years         BP:           101/77 mmHg Patient Gender: M                HR:           75 bpm. Exam Location:  [facility] Procedure: 2D Echo Indications:    Cardiomyopathy-Unspecified 425.9 / I42.9  History:        Patient has prior history of Echocardiogram examinations, most                 recent 04/24/2019. CHF, Arrythmias:Atrial Fibrillation; Risk                 Factors:Hypertension, Diabetes, Current Smoker and Dyslipidemia.                 Thrombus in heart chamber, Chronic renal failure.  Sonographer:     Leavy Cella RDCS (AE) Referring Phys: [phone] Royanne Foots [place]  1. No LV thrombus noted . Left ventricular ejection fraction, by estimation, is 25 to 30%. The left ventricle has severely decreased function. The left ventricle demonstrates global hypokinesis. The left ventricular internal cavity size was moderately dilated. There is mild left ventricular hypertrophy. Left ventricular diastolic parameters are consistent with Grade II diastolic dysfunction (pseudonormalization). Elevated left ventricular end-diastolic pressure.  2. Right ventricular systolic function is moderately reduced. The right ventricular size is mildly enlarged. There is moderately elevated pulmonary artery systolic pressure.  3. Left atrial size was moderately dilated.  4. Right atrial size was mildly dilated.  5. The mitral valve is normal in structure. No evidence of mitral valve regurgitation. No evidence of mitral stenosis.  6. The aortic valve is tricuspid. Aortic valve regurgitation is not visualized. No aortic stenosis is present.  7. The inferior vena cava is dilated in size with >50% respiratory variability, suggesting right atrial pressure of 8 mmHg. FINDINGS  Left Ventricle: No LV thrombus noted. Left ventricular ejection fraction, by estimation, is 25 to 30%. The left ventricle has severely decreased function. The left ventricle demonstrates global hypokinesis. The left ventricular internal cavity size was moderately dilated. There is mild left ventricular hypertrophy. Left ventricular diastolic parameters are consistent with Grade II diastolic dysfunction (pseudonormalization). Elevated left ventricular end-diastolic pressure. Right Ventricle: The right ventricular size is mildly enlarged. Right vetricular wall thickness was not assessed. Right ventricular systolic function is moderately reduced. There is moderately elevated pulmonary artery systolic pressure. The tricuspid regurgitant velocity is 3.31 m/s, and with an  assumed right atrial pressure of 10 mmHg, the estimated right ventricular systolic pressure is 27.2 mmHg. Left Atrium: Left atrial size was moderately dilated. Right Atrium: Right atrial size was mildly dilated. Pericardium: There is no evidence of pericardial effusion. Mitral Valve: The mitral valve is normal in structure. Normal mobility of the mitral valve leaflets. No evidence of mitral valve regurgitation. No evidence of mitral valve stenosis. Tricuspid Valve: The tricuspid valve is normal in structure. Tricuspid valve  regurgitation is mild . No evidence of tricuspid stenosis. Aortic Valve: The aortic valve is tricuspid. Aortic valve regurgitation is not visualized. No aortic stenosis is present. Pulmonic Valve: The pulmonic valve was normal in structure. Pulmonic valve regurgitation is mild. No evidence of pulmonic stenosis. Aorta: The aortic root is normal in size and structure. Venous: The inferior vena cava is dilated in size with greater than 50% respiratory variability, suggesting right atrial pressure of 8 mmHg. IAS/Shunts: No atrial level shunt detected by color flow Doppler.  LEFT VENTRICLE PLAX 2D LVIDd:         5.43 cm  Diastology LVIDs:         4.63 cm  LV e' lateral:   4.80 cm/s LV PW:         1.29 cm  LV E/e' lateral: 22.7 LV IVS:        1.40 cm  LV e' medial:    2.40 cm/s LVOT diam:     1.90 cm  LV E/e' medial:  45.4 LVOT Area:     2.84 cm  RIGHT VENTRICLE RV S prime:     9.45 cm/s TAPSE (M-mode): 1.9 cm LEFT ATRIUM              Index       RIGHT ATRIUM           Index LA diam:        4.40 cm  1.99 cm/m  RA Area:     23.10 cm LA Vol (A2C):   100.0 ml 45.23 ml/m RA Volume:   82.40 ml  37.27 ml/m LA Vol (A4C):   75.1 ml  33.96 ml/m LA Biplane Vol: 87.7 ml  39.66 ml/m   AORTA Ao Root diam: 2.80 cm MITRAL VALVE                TRICUSPID VALVE MV Area (PHT): 4.80 cm     TR Peak grad:   43.8 mmHg MV Decel Time: 158 msec     TR Vmax:        331.00 cm/s MR Peak grad: 46.5 mmHg MR Mean grad: 32.0  mmHg     SHUNTS MR Vmax:      341.00 cm/s   Systemic Diam: 1.90 cm MR Vmean:     269.0 cm/s MV E velocity: 109.00 cm/s MV A velocity: 23.80 cm/s MV E/A ratio:  4.58 Jenkins Rouge MD Electronically signed by Jenkins Rouge MD Signature Date/Time: 07/03/2019/11:41:20 AM    Final     Discharge Exam: Vitals:   07/06/19 2111 07/07/19 0515  BP:  (!) 117/97  Pulse: 90 74  Resp:  16  Temp:  98.6 F (37 C)  SpO2:  95%   Vitals:   07/06/19 1351 07/06/19 2105 07/06/19 2111 07/07/19 0515  BP: 112/89 110/90  (!) 117/97  Pulse: 91 (!) 54 90 74  Resp: 16 16  16   Temp: 98.2 F (36.8 C) 98 F (36.7 C)  98.6 F (37 C)  TempSrc: Oral Oral  Oral  SpO2: 93% 96%  95%  Weight:    93.5 kg  Height:        General: Pt is alert, awake, not in acute distress Cardiovascular: RRR, S1/S2 +, no rubs, no gallops Respiratory: CTA bilaterally, no wheezing, no rhonchi Abdominal: Soft, NT, ND, bowel sounds + Extremities: no edema, no cyanosis    The results of significant diagnostics from this hospitalization (including imaging, microbiology, ancillary and laboratory) are listed below for reference.  Microbiology: Recent Results (from the past 240 hour(s))  SARS Coronavirus 2 by RT PCR (hospital order, performed in [facility] hospital lab) Nasopharyngeal Nasopharyngeal Swab     Status: None   Collection Time: 07/02/19 11:57 AM   Specimen: Nasopharyngeal Swab  Result Value Ref Range Status   SARS Coronavirus 2 NEGATIVE NEGATIVE Final    Comment: (NOTE) SARS-CoV-2 target nucleic acids are NOT DETECTED.  The SARS-CoV-2 RNA is generally detectable in upper and lower respiratory specimens during the acute phase of infection. The lowest concentration of SARS-CoV-2 viral copies this assay can detect is 250 copies / mL. A negative result does not preclude SARS-CoV-2 infection and should not be used as the sole basis for treatment or other patient management decisions.  A negative result may occur  with improper specimen collection / handling, submission of specimen other than nasopharyngeal swab, presence of viral mutation(s) within the areas targeted by this assay, and inadequate number of viral copies (<250 copies / mL). A negative result must be combined with clinical observations, patient history, and epidemiological information.  Fact Sheet for Patients:   StrictlyIdeas.no  Fact Sheet for Healthcare Providers: BankingDealers.co.za  This test is not yet approved or  cleared by the [place] FDA and has been authorized for detection and/or diagnosis of SARS-CoV-2 by FDA under an Emergency Use Authorization (EUA).  This EUA will remain in effect (meaning this test can be used) for the duration of the COVID-19 declaration under Section 564(b)(1) of the Act, 21 U.S.C. section 360bbb-3(b)(1), unless the authorization is terminated or revoked sooner.  Performed at [facility] [address], [place], [place] [address]      Labs: BNP (last 3 results) Recent Labs    04/23/19 1020 07/02/19 1130  BNP 1,155.0* 2,458.0*   Basic Metabolic Panel: Recent Labs  Lab 07/03/19 0614 07/04/19 0744 07/05/19 0537 07/06/19 0619 07/07/19 0533  NA 142 142 140 140 138  K 3.4* 3.4* 3.4* 3.7 3.9  CL 103 101 100 99 100  CO2 29 27 28 27 24   GLUCOSE 107* 108* 136* 132* 141*  BUN 32* 24* 22 24* 33*  CREATININE 1.28* 1.17 1.06 1.15 1.24  CALCIUM 7.4* 8.2* 8.5* 8.6* 8.8*  MG 1.4* 1.7 1.4* 1.6* 1.9   Liver Function Tests: Recent Labs  Lab 07/02/19 1130 07/03/19 0614 07/05/19 0537 07/06/19 0619 07/07/19 0533  AST 117* 64* 27 20 17   ALT 617* 448* 252* 177* 130*  ALKPHOS 100 82 96 89 84  BILITOT 0.4 1.3* 1.5* 2.0* 1.6*  PROT 6.7 6.2* 7.2 7.2 7.1  ALBUMIN 3.5 3.1* 3.5 3.4* 3.4*   No results for input(s): LIPASE, AMYLASE in the last 168 hours. No results for input(s): AMMONIA in the last 168 hours. CBC: Recent Labs  Lab  07/02/19 1130 07/03/19 0614 07/04/19 0534 07/06/19 0619  WBC 6.5 5.4 6.5 8.7  NEUTROABS 4.4 3.3  --   --   HGB 12.4* 11.8* 12.6* 13.2  HCT 38.0* 36.7* 39.0 41.3  MCV 103.3* 103.4* 104.8* 104.3*  PLT 198 182 209 216   Cardiac Enzymes: No results for input(s): CKTOTAL, CKMB, CKMBINDEX, TROPONINI in the last 168 hours. BNP: Invalid input(s): POCBNP CBG: Recent Labs  Lab 07/06/19 0730 07/06/19 1056 07/06/19 1638 07/06/19 2102 07/07/19 0710  GLUCAP 111* 222* 138* 162* 142*   D-Dimer No results for input(s): DDIMER in the last 72 hours. Hgb A1c No results for input(s): HGBA1C in the last 72 hours. Lipid Profile No results for input(s): CHOL,  HDL, LDLCALC, TRIG, CHOLHDL, LDLDIRECT in the last 72 hours. Thyroid function studies No results for input(s): TSH, T4TOTAL, T3FREE, THYROIDAB in the last 72 hours.  Invalid input(s): FREET3 Anemia work up No results for input(s): VITAMINB12, FOLATE, FERRITIN, TIBC, IRON, RETICCTPCT in the last 72 hours. Urinalysis    Component Value Date/Time   COLORURINE YELLOW 06/28/2006 1102   APPEARANCEUR CLEAR 06/28/2006 1102   LABSPEC 1.020 06/28/2006 1102   PHURINE 6.0 06/28/2006 1102   GLUCOSEU NEGATIVE 06/28/2006 1102   HGBUR NEGATIVE 06/28/2006 1102   BILIRUBINUR NEGATIVE 06/28/2006 1102   KETONESUR NEGATIVE 06/28/2006 1102   PROTEINUR NEGATIVE 06/28/2006 1102   UROBILINOGEN 1.0 06/28/2006 1102   NITRITE NEGATIVE 06/28/2006 1102   LEUKOCYTESUR  06/28/2006 1102    NEGATIVE MICROSCOPIC NOT DONE ON URINES WITH NEGATIVE PROTEIN, BLOOD, LEUKOCYTES, NITRITE, OR GLUCOSE <1000 mg/dL.   Sepsis Labs Invalid input(s): PROCALCITONIN,  WBC,  LACTICIDVEN Microbiology Recent Results (from the past 240 hour(s))  SARS Coronavirus 2 by RT PCR (hospital order, performed in [facility] hospital lab) Nasopharyngeal Nasopharyngeal Swab     Status: None   Collection Time: 07/02/19 11:57 AM   Specimen: Nasopharyngeal Swab  Result Value Ref Range Status    SARS Coronavirus 2 NEGATIVE NEGATIVE Final    Comment: (NOTE) SARS-CoV-2 target nucleic acids are NOT DETECTED.  The SARS-CoV-2 RNA is generally detectable in upper and lower respiratory specimens during the acute phase of infection. The lowest concentration of SARS-CoV-2 viral copies this assay can detect is 250 copies / mL. A negative result does not preclude SARS-CoV-2 infection and should not be used as the sole basis for treatment or other patient management decisions.  A negative result may occur with improper specimen collection / handling, submission of specimen other than nasopharyngeal swab, presence of viral mutation(s) within the areas targeted by this assay, and inadequate number of viral copies (<250 copies / mL). A negative result must be combined with clinical observations, patient history, and epidemiological information.  Fact Sheet for Patients:   StrictlyIdeas.no  Fact Sheet for Healthcare Providers: BankingDealers.co.za  This test is not yet approved or  cleared by the [place] FDA and has been authorized for detection and/or diagnosis of SARS-CoV-2 by FDA under an Emergency Use Authorization (EUA).  This EUA will remain in effect (meaning this test can be used) for the duration of the COVID-19 declaration under Section 564(b)(1) of the Act, 21 U.S.C. section 360bbb-3(b)(1), unless the authorization is terminated or revoked sooner.  Performed at [facility], [address] [place], [place] [address]      Time coordinating discharge: 35 minutes  SIGNED:   Rodena Goldmann, DO [facility] 07/07/2019, 10:14 AM  If 7PM-7AM, please contact night-coverage [email or web address]

## 2019-07-17 ENCOUNTER — Other Ambulatory Visit: Payer: Self-pay

## 2019-07-17 ENCOUNTER — Ambulatory Visit: Payer: Medicare PPO | Admitting: Cardiology

## 2019-07-17 ENCOUNTER — Encounter: Payer: Self-pay | Admitting: Cardiology

## 2019-07-17 VITALS — BP 112/78 | HR 74 | Ht 72.0 in | Wt 212.0 lb

## 2019-07-17 DIAGNOSIS — I5023 Acute on chronic systolic (congestive) heart failure: Secondary | ICD-10-CM

## 2019-07-17 DIAGNOSIS — I4891 Unspecified atrial fibrillation: Secondary | ICD-10-CM

## 2019-07-17 NOTE — Patient Instructions (Signed)
Medication Instructions:  Your physician recommends that you continue on your current medications as directed. Please refer to the Current Medication list given to you today.  *If you need a refill on your cardiac medications before your next appointment, please call your pharmacy*   Lab Work: NONE  If you have labs (blood work) drawn today and your tests are completely normal, you will receive your results only by: Marland Kitchen MyChart Message (if you have MyChart) OR . A paper copy in the mail If you have any lab test that is abnormal or we need to change your treatment, we will call you to review the results.   Testing/Procedures: NONE   Follow-Up: At [facility], you and your health needs are our priority.  As part of our continuing mission to provide you with exceptional heart care, we have created designated Provider Care Teams.  These Care Teams include your primary Cardiologist (physician) and Advanced Practice Providers (APPs -  Physician Assistants and Nurse Practitioners) who all work together to provide you with the care you need, when you need it.  We recommend signing up for the patient portal called "MyChart".  Sign up information is provided on this After Visit Summary.  MyChart is used to connect with patients for Virtual Visits (Telemedicine).  Patients are able to view lab/test results, encounter notes, upcoming appointments, etc.  Non-urgent messages can be sent to your provider as well.   To learn more about what you can do with MyChart, go to [email or web address].    Your next appointment:   3 week(s)  The format for your next appointment:   In Person  Provider:   You may see Carlyle Dolly, MD or one of the following Advanced Practice Providers on your designated Care Team:    Bernerd Pho, PA-C   Ermalinda Barrios, [place]     Other Instructions Thank you for choosing [place]!

## 2019-07-17 NOTE — Progress Notes (Signed)
Clinical Summary [name] [name] is a 64 y.o.male seen today for follow up of the following medical problems.    1. Chronic systolic HF -History of chronic systolic HF LVEF 15-17% by 2015 echo, has not followed up since 2016 in cardiology clinic. Cath in 2013 without significant disease, consistent with a NICM. - 04/2019 echo LVEF 25-30%, mod RV dysfunction - admitted 04/2019 with fluid overload     - admitted 06/2019 volume overloaded - 06/2019 echo LVEF 25-30%, grade II DDx, mod RV dysfunction - diuresed 225 to 206 lbs. Cr 1.24 at discharge. Unclear if the severe rise in Cr as outpatient was lab error or accurate  - home weight 208-210. Compliant with meds - some ongoing swelling that increased since discharge.       2. LV thrombus - noted on echoduring 04/2019 admission -resolved by repeat echo, coumadin was changed to eliquis.    3. Afib - new diagnosis4/2021admission - last visit we started him on amio due to elevated HRs, soft bp's did not allow titration of beta blocker  - taking amiodarone 276m bid, changes 2030mnext week.  - June 14 EKG SR, did have an EKG 6/18 in the hospital in afib. Still amio loading   4. CKD 3 - monitoring on diuretic  5. NSVT - in setting of systolic dysfunction, NICM Past Medical History:  Diagnosis Date  . Anxiety   . [place]) [place]   a. EF 15% in 2013 with cath showing normal cors b. EF 30-35% by repeat echo in 2015  . [place]  . Diabetes mellitus   . Fluid retention in legs   . Hypertension      No Known Allergies   Current Outpatient Medications  Medication Sig Dispense Refill  . ALPRAZolam (XANAX) 0.25 MG tablet Take 1 tablet (0.25 mg total) by mouth 3 (three) times daily as needed for anxiety. 20 tablet 0  . amiodarone (PACERONE) 200 MG tablet TAKE 400 MG TWICE DAILY FOR 1 WEEK THEN 200 MG TWICE DAILY FOR 2 WEEKS THEN TAKE 200 MG DAILY 180 tablet 1  .  apixaban (ELIQUIS) 5 MG TABS tablet Take 1 tablet (5 mg total) by mouth 2 (two) times daily. 60 tablet 3  . losartan (COZAAR) 25 MG tablet Take 0.5 tablets (12.5 mg total) by mouth daily. (Patient not taking: Reported on 07/02/2019) 45 tablet 3  . Magnesium Oxide 200 MG TABS Take 1 tablet (200 mg total) by mouth daily. 30 tablet 0  . metFORMIN (GLUCOPHAGE) 500 MG tablet Take 500 mg by mouth 2 (two) times daily.    . metoprolol succinate (TOPROL-XL) 25 MG 24 hr tablet Take 1.5 tablets (37.5 mg total) by mouth daily. 135 tablet 3  . Oxycodone HCl 10 MG TABS Take 10 mg by mouth 4 (four) times daily as needed.    . potassium chloride SA (KLOR-CON) 20 MEQ tablet Take 2 tablets (40 mEq total) by mouth daily. 180 tablet 3  . simvastatin (ZOCOR) 40 MG tablet Take 1 tablet (40 mg total) by mouth at bedtime. 90 tablet 1  . torsemide (DEMADEX) 20 MG tablet Take 4035mO in AM and 57m10m in PM. 90 tablet 3   No current facility-administered medications for this visit.     Past Surgical History:  Procedure Laterality Date  . COLONOSCOPY  01/04/2012   Procedure: COLONOSCOPY;  Surgeon: SandDanie Binder;  [facility];  Service: Endoscopy;  Laterality: N/A;  1:30 PM  . HERNIA REPAIR    . LEFT AND RIGHT HEART CATHETERIZATION WITH CORONARY ANGIOGRAM N/A 07/09/2011   Procedure: LEFT AND RIGHT HEART CATHETERIZATION WITH CORONARY ANGIOGRAM;  Surgeon: Peter M Martinique, MD;  Location[facility] CATH LAB;  Service: Cardiovascular;  Laterality: N/A;     No Known Allergies    Family History  Problem Relation Age of Onset  . Heart attack Father   . Heart failure Father   . Cancer Mother        Multiple myeloma  . Heart disease Sister      Social History [name] [name] reports that he has been smoking cigarettes and e-cigarettes. He started smoking about 36 years ago. He has been smoking about 0.50 packs per day. He has never used smokeless tobacco. [name] [name] reports current alcohol use.   Review of  Systems CONSTITUTIONAL: No weight loss, fever, chills, weakness or fatigue.  HEENT: Eyes: No visual loss, blurred vision, double vision or yellow sclerae.No hearing loss, sneezing, congestion, runny nose or sore throat.  SKIN: No rash or itching.  CARDIOVASCULAR: per hpi RESPIRATORY: No shortness of breath, cough or sputum.  GASTROINTESTINAL: No anorexia, nausea, vomiting or diarrhea. No abdominal pain or blood.  GENITOURINARY: No burning on urination, no polyuria NEUROLOGICAL: No headache, dizziness, syncope, paralysis, ataxia, numbness or tingling in the extremities. No change in bowel or bladder control.  MUSCULOSKELETAL: No muscle, back pain, joint pain or stiffness.  LYMPHATICS: No enlarged nodes. No history of splenectomy.  PSYCHIATRIC: No history of depression or anxiety.  ENDOCRINOLOGIC: No reports of sweating, cold or heat intolerance. No polyuria or polydipsia.  Marland Kitchen   Physical Examination Today's Vitals   07/17/19 1350  BP: 112/78  Pulse: 74  SpO2: 95%  Weight: 212 lb (96.2 kg)  Height: 6' (1.829 m)   Body mass index is 28.75 kg/m.  Gen: resting comfortably, no acute distress HEENT: no scleral icterus, pupils equal round and reactive, no palptable cervical adenopathy,  CV: RRR, no mrg,n no jvd Resp: Clear to auscultation bilaterally GI: abdomen is soft, non-tender, non-distended, normal bowel sounds, no hepatosplenomegaly MSK: extremities are warm, 1+ bilateral LE edema Skin: warm, no rash Neuro:  no focal deficits Psych: appropriate affect   Diagnostic Studies  06/2019 echo IMPRESSIONS    1. No LV thrombus noted . Left ventricular ejection fraction, by  estimation, is 25 to 30%. The left ventricle has severely decreased  function. The left ventricle demonstrates global hypokinesis. The left  ventricular internal cavity size was moderately  dilated. There is mild left ventricular hypertrophy. Left ventricular  diastolic parameters are consistent with Grade II  diastolic dysfunction  (pseudonormalization). Elevated left ventricular end-diastolic pressure.  2. Right ventricular systolic function is moderately reduced. The right  ventricular size is mildly enlarged. There is moderately elevated  pulmonary artery systolic pressure.  3. Left atrial size was moderately dilated.  4. Right atrial size was mildly dilated.  5. The mitral valve is normal in structure. No evidence of mitral valve  regurgitation. No evidence of mitral stenosis.  6. The aortic valve is tricuspid. Aortic valve regurgitation is not  visualized. No aortic stenosis is present.  7. The inferior vena cava is dilated in size with >50% respiratory  variability, suggesting right atrial pressure of 8 mmHg.    Assessment and Plan  1. Acute on chronic systolic HF -some increased edema since discharge. He reports recent labs with pcp, would need to verify kidney function prior to  uptitrating his diuretic.    2. Afib - continue amiodarone, he was in and out of afib during recent admission, amio is still in loading phase   3. LV thrombus -resolved by recent echo, he was changed from coumadin to eliquis.          Arnoldo Lenis, M.D

## 2019-07-18 ENCOUNTER — Encounter: Payer: Self-pay | Admitting: *Deleted

## 2019-07-31 ENCOUNTER — Other Ambulatory Visit: Payer: Self-pay | Admitting: Student

## 2019-07-31 MED ORDER — POTASSIUM CHLORIDE CRYS ER 20 MEQ PO TBCR: 40.0000 meq | EXTENDED_RELEASE_TABLET | Freq: Every day | ORAL | 3 refills | Status: DC

## 2019-07-31 NOTE — Telephone Encounter (Signed)
Refilled as requested  

## 2019-07-31 NOTE — Telephone Encounter (Signed)
Please call in Kt to [place]    Thanks renee

## 2019-08-03 ENCOUNTER — Emergency Department (HOSPITAL_COMMUNITY): Payer: Medicare PPO

## 2019-08-03 ENCOUNTER — Emergency Department (HOSPITAL_COMMUNITY)
Admission: EM | Admit: 2019-08-03 | Discharge: 2019-08-03 | Disposition: A | Discharge status: Home / Self Care | Payer: Medicare PPO | Attending: Emergency Medicine | Admitting: Emergency Medicine

## 2019-08-03 ENCOUNTER — Other Ambulatory Visit: Payer: Self-pay

## 2019-08-03 ENCOUNTER — Encounter (HOSPITAL_COMMUNITY): Payer: Self-pay | Admitting: Emergency Medicine

## 2019-08-03 DIAGNOSIS — R2241 Localized swelling, mass and lump, right lower limb: Secondary | ICD-10-CM | POA: Diagnosis present

## 2019-08-03 DIAGNOSIS — F17210 Nicotine dependence, cigarettes, uncomplicated: Secondary | ICD-10-CM | POA: Insufficient documentation

## 2019-08-03 DIAGNOSIS — Z7901 Long term (current) use of anticoagulants: Secondary | ICD-10-CM | POA: Diagnosis not present

## 2019-08-03 DIAGNOSIS — I130 Hypertensive heart and chronic kidney disease with heart failure and stage 1 through stage 4 chronic kidney disease, or unspecified chronic kidney disease: Secondary | ICD-10-CM | POA: Diagnosis not present

## 2019-08-03 DIAGNOSIS — N1831 Chronic kidney disease, stage 3a: Secondary | ICD-10-CM | POA: Insufficient documentation

## 2019-08-03 DIAGNOSIS — L03115 Cellulitis of right lower limb: Secondary | ICD-10-CM | POA: Diagnosis not present

## 2019-08-03 DIAGNOSIS — I5023 Acute on chronic systolic (congestive) heart failure: Secondary | ICD-10-CM | POA: Diagnosis not present

## 2019-08-03 DIAGNOSIS — Z79899 Other long term (current) drug therapy: Secondary | ICD-10-CM | POA: Diagnosis not present

## 2019-08-03 DIAGNOSIS — Z7984 Long term (current) use of oral hypoglycemic drugs: Secondary | ICD-10-CM | POA: Diagnosis not present

## 2019-08-03 DIAGNOSIS — M79673 Pain in unspecified foot: Secondary | ICD-10-CM

## 2019-08-03 DIAGNOSIS — E1121 Type 2 diabetes mellitus with diabetic nephropathy: Secondary | ICD-10-CM | POA: Diagnosis not present

## 2019-08-03 LAB — BASIC METABOLIC PANEL
Anion gap: 16 — ABNORMAL HIGH (ref 5–15)
BUN: 16 mg/dL (ref 8–23)
CO2: 28 mmol/L (ref 22–32)
Calcium: 8.9 mg/dL (ref 8.9–10.3)
Chloride: 94 mmol/L — ABNORMAL LOW (ref 98–111)
Creatinine, Ser: 1.31 mg/dL — ABNORMAL HIGH (ref 0.61–1.24)
GFR calc Af Amer: >60 mL/min (ref >60)
GFR calc non Af Amer: 58 mL/min — ABNORMAL LOW (ref >60)
Glucose, Bld: 128 mg/dL — ABNORMAL HIGH (ref 70–99)
Potassium: 4.1 mmol/L (ref 3.5–5.1)
Sodium: 138 mmol/L (ref 135–145)

## 2019-08-03 LAB — CBC WITH DIFFERENTIAL/PLATELET
Abs Immature Granulocytes: 0.04 10*3/uL (ref 0.00–0.07)
Basophils Absolute: 0 10*3/uL (ref 0.0–0.1)
Basophils Relative: 0 %
Eosinophils Absolute: 0.1 10*3/uL (ref 0.0–0.5)
Eosinophils Relative: 1 %
HCT: 45.3 % (ref 39.0–52.0)
Hemoglobin: 14.5 g/dL (ref 13.0–17.0)
Immature Granulocytes: 0 %
Lymphocytes Relative: 16 %
Lymphs Abs: 1.7 10*3/uL (ref 0.7–4.0)
MCH: 32.6 pg (ref 26.0–34.0)
MCHC: 32 g/dL (ref 30.0–36.0)
MCV: 101.8 fL — ABNORMAL HIGH (ref 80.0–100.0)
Monocytes Absolute: 1 10*3/uL (ref 0.1–1.0)
Monocytes Relative: 9 %
Neutro Abs: 7.7 10*3/uL (ref 1.7–7.7)
Neutrophils Relative %: 74 %
Platelets: 201 10*3/uL (ref 150–400)
RBC: 4.45 MIL/uL (ref 4.22–5.81)
RDW: 15.3 % (ref 11.5–15.5)
WBC: 10.4 10*3/uL (ref 4.0–10.5)
nRBC: 0 % (ref 0.0–0.2)

## 2019-08-03 MED ORDER — CEPHALEXIN 500 MG PO CAPS
500.0000 mg | ORAL_CAPSULE | Freq: Four times a day (QID) | ORAL | 0 refills | Status: DC
Start: 2019-08-03 — End: 2019-09-17

## 2019-08-03 MED ORDER — CEPHALEXIN 500 MG PO CAPS
500.0000 mg | ORAL_CAPSULE | Freq: Once | ORAL | Status: AC
  Administered 2019-08-03: 500 mg via ORAL
  Filled 2019-08-03: qty 1

## 2019-08-03 NOTE — Discharge Instructions (Addendum)
Your exam suggests this is a skin infection called cellulitis.  Take the entire course of the antibiotic prescribed.  Elevate your foot is much as possible and apply warm but not hot compresses for 10 to 15 minutes several times daily.  Get rechecked for any sign of spreading swelling, worse pain or any new complaints as discussed.

## 2019-08-03 NOTE — ED Triage Notes (Signed)
Pt reports right foot pain/swelling for last several days. Pt denies any known injury.

## 2019-08-03 NOTE — ED Provider Notes (Signed)
[facility] EMERGENCY DEPARTMENT Provider Note   CSN: [id number] Arrival date & time: 08/03/19  1109     History Chief Complaint  Patient presents with  . Foot Pain    [name] [name] is a 64 y.o. male with a history of CHF, non-insulin-dependent diabetes and hypertension, atrial fibrillation on Eliquis presenting with a several day history of right foot pain and swelling.  He reports generalized swelling of the foot, increased pain and pressure sensation with weightbearing.  He denies injury.  He also denies swelling in his leg beyond the foot itself.  He was recently admitted for CHF exacerbation, was discharged home on June 19.  He endorses he has not been as active as he is still recovering from this hospitalization.  However he denies increased shortness of breath, denies chest pain, no fevers or chills.  The history is provided by the patient.       Past Medical History:  Diagnosis Date  . Anxiety   . CHF (congestive heart failure) ([place])    a. EF 15% in 2013 with cath showing normal cors b. EF 30-35% by repeat echo in 2015  . Chronic systolic heart failure ([place])   . Diabetes mellitus   . Fluid retention in legs   . Hypertension     Patient Active Problem List   Diagnosis Date Noted  . Atrial fibrillation ([place]) 04/30/2019  . Thrombus in heart chamber 04/30/2019  . Acute respiratory failure with hypoxia ([place]) 04/25/2019  . Acute on chronic systolic (congestive) heart failure ([place]) 04/23/2019  . Chronic pain 04/23/2019  . Chronic renal failure, stage 3a 04/23/2019  . Generalized anxiety disorder 08/14/2014  . Diabetes mellitus type 2, uncontrolled ([place]) 08/14/2014  . Hyperlipidemia 08/14/2014  . Onychomycosis 08/14/2014  . Hypertrophic toenail 08/14/2014  . Tinea versicolor 08/14/2014  . Hepatomegaly 08/14/2014  . Pain in joint, upper arm 03/06/2014  . Cardiomyopathy, dilated, nonischemic ([place]) 10/12/2013  . Chronic systolic heart failure ([place]) 07/08/2011  . Type  2 diabetes with nephropathy ([place]) 07/08/2011  . Hypertension 07/08/2011    Past Surgical History:  Procedure Laterality Date  . COLONOSCOPY  01/04/2012   Procedure: COLONOSCOPY;  Surgeon: Danie Binder, MD;  [facility];  Service: Endoscopy;  Laterality: N/A;  1:30 PM  . HERNIA REPAIR    . LEFT AND RIGHT HEART CATHETERIZATION WITH CORONARY ANGIOGRAM N/A 07/09/2011   Procedure: LEFT AND RIGHT HEART CATHETERIZATION WITH CORONARY ANGIOGRAM;  Surgeon: Peter M Martinique, MD;  Location: [facility] CATH LAB;  Service: Cardiovascular;  Laterality: N/A;       Family History  Problem Relation Age of Onset  . Heart attack Father   . Heart failure Father   . Cancer Mother        Multiple myeloma  . Heart disease Sister     Social History   Tobacco Use  . Smoking status: Current Some Day Smoker    Packs/day: 0.25    Types: Cigarettes, E-cigarettes    Start date: 10/13/1982    Last attempt to quit: 09/19/2011    Years since quitting: 7.8  . Smokeless tobacco: Never Used  . Tobacco comment: electronic cig for 4 months   Vaping Use  . Vaping Use: Never used  Substance Use Topics  . Alcohol use: Not Currently    Comment: weekends  . Drug use: No    Home Medications Prior to Admission medications   Medication Sig Start Date End Date Taking? Authorizing Provider  ALPRAZolam Duanne Moron)  0.25 MG tablet Take 1 tablet (0.25 mg total) by mouth 3 (three) times daily as needed for anxiety. 07/07/19   Manuella Ghazi, Pratik D, DO  amiodarone (PACERONE) 200 MG tablet TAKE 400 MG TWICE DAILY FOR 1 WEEK THEN 200 MG TWICE DAILY FOR 2 WEEKS THEN TAKE 200 MG DAILY 06/25/19   Arnoldo Lenis, MD  apixaban (ELIQUIS) 5 MG TABS tablet Take 1 tablet (5 mg total) by mouth 2 (two) times daily. 07/07/19 08/06/19  Manuella Ghazi, Pratik D, DO  cephALEXin (KEFLEX) 500 MG capsule Take 1 capsule (500 mg total) by mouth 4 (four) times daily. 08/03/19   Evalee Jefferson, PA-C  losartan (COZAAR) 25 MG tablet Take 0.5 tablets (12.5 mg total) by  mouth daily. 05/11/19   Strader, Fransisco Hertz, PA-C  Magnesium Oxide 200 MG TABS Take 1 tablet (200 mg total) by mouth daily. 07/07/19 08/06/19  Manuella Ghazi, Pratik D, DO  metFORMIN (GLUCOPHAGE) 500 MG tablet Take 500 mg by mouth 2 (two) times daily. 04/20/19   [provider]  metoprolol succinate (TOPROL-XL) 25 MG 24 hr tablet Take 1.5 tablets (37.5 mg total) by mouth daily. 05/11/19   Strader, Fransisco Hertz, PA-C  Oxycodone HCl 10 MG TABS Take 10 mg by mouth 4 (four) times daily as needed. 04/11/19   [provider]  potassium chloride SA (KLOR-CON) 20 MEQ tablet Take 2 tablets (40 mEq total) by mouth daily. 07/31/19   Arnoldo Lenis, MD  simvastatin (ZOCOR) 40 MG tablet Take 1 tablet (40 mg total) by mouth at bedtime. 08/15/14   Tysinger, Camelia Eng, PA-C  torsemide (DEMADEX) 20 MG tablet Take 40m PO in AM and 269mPO in PM. 07/07/19   ShManuella GhaziPratik D, DO    Allergies    Patient has no known allergies.  Review of Systems   Review of Systems  Constitutional: Negative for chills and fever.  Respiratory: Negative.   Cardiovascular: Negative.   Gastrointestinal: Negative.   Musculoskeletal: Positive for arthralgias and joint swelling. Negative for myalgias.  Neurological: Negative for weakness and numbness.  All other systems reviewed and are negative.   Physical Exam Updated Vital Signs BP 101/79 (BP Location: Right Arm)   Pulse 69   Temp 98.8 F (37.1 C) (Oral)   Resp 18   Ht 6' (1.829 m)   Wt 90.7 kg   SpO2 95%   BMI 27.12 kg/m   Physical Exam Vitals reviewed.  Constitutional:      Appearance: He is well-developed.  HENT:     Head: Atraumatic.  Cardiovascular:     Comments: Pulses equal bilaterally Musculoskeletal:        General: Swelling and tenderness present.     Cervical back: Normal range of motion.     Right foot: Swelling and tenderness present.     Comments: Generalized tenderness and swelling of the right foot which ends at the ankle.  There is subtle  coloration changes suggesting erythema.  There is no red streaking.  Dystrophic toenails with evidence of onychomycosis.  There are no puncture wounds, ulcerations or any other evidence for skin injury.  There is cracking and scaling on his plantar feet.  No calf or upper leg tenderness or edema.  Skin:    General: Skin is warm and dry.  Neurological:     Mental Status: He is alert.     Sensory: No sensory deficit.     Deep Tendon Reflexes: Reflexes normal.     ED Results / Procedures / Treatments  Labs (all labs ordered are listed, but only abnormal results are displayed) Labs Reviewed  CBC WITH DIFFERENTIAL/PLATELET - Abnormal; Notable for the following components:      Result Value   MCV 101.8 (*)    All other components within normal limits  BASIC METABOLIC PANEL - Abnormal; Notable for the following components:   Chloride 94 (*)    Glucose, Bld 128 (*)    Creatinine, Ser 1.31 (*)    GFR calc non Af Amer 58 (*)    Anion gap 16 (*)    All other components within normal limits    EKG None  Radiology DG Foot Complete Right  Result Date: 08/03/2019 CLINICAL DATA:  Right foot pain and swelling EXAM: RIGHT FOOT COMPLETE - 3+ VIEW COMPARISON:  None. FINDINGS: There is no evidence of fracture or dislocation. There is no evidence of arthropathy or other focal bone abnormality. Diffuse soft tissue edema about the forefoot. IMPRESSION: 1. No fracture or dislocation of the right foot. Joint spaces are preserved. 2.  Diffuse soft tissue edema about the forefoot. Electronically Signed   By: Eddie Candle M.D.   On: 08/03/2019 12:12    Procedures Procedures (including critical care time)  Medications Ordered in ED Medications  cephALEXin (KEFLEX) capsule 500 mg (has no administration in time range)    ED Course  I have reviewed the triage vital signs and the nursing notes.  Pertinent labs & imaging results that were available during my care of the patient were reviewed by me and  considered in my medical decision making (see chart for details).    MDM Rules/Calculators/A&P                          Patient with right foot pain and swelling, no obvious injury.  His exam suggest cellulitis of the extremity.  He has no pain in his calf or upper leg, no edema, negative Homans' sign, distribution does not suggest DVT.  Labs and imaging reviewed and discussed with pt. Keflex, elevation, gentle heat tx.  Return precautions discussed.  Close f/u with pcp next week. Final Clinical Impression(s) / ED Diagnoses Final diagnoses:  Foot pain  Cellulitis of right lower extremity    Rx / DC Orders ED Discharge Orders         Ordered    cephALEXin (KEFLEX) 500 MG capsule  4 times daily     Discontinue  Reprint     08/03/19 1548           Evalee Jefferson, PA-C 08/03/19 1550    Milton Ferguson, MD 08/04/19 1753

## 2019-08-08 ENCOUNTER — Other Ambulatory Visit: Payer: Self-pay

## 2019-08-08 ENCOUNTER — Ambulatory Visit (HOSPITAL_COMMUNITY)
Admission: RE | Admit: 2019-08-08 | Discharge: 2019-08-08 | Disposition: A | Discharge status: Home / Self Care | Payer: Medicare PPO | Source: Ambulatory Visit | Attending: Student | Admitting: Student

## 2019-08-08 DIAGNOSIS — I420 Dilated cardiomyopathy: Secondary | ICD-10-CM | POA: Diagnosis not present

## 2019-08-08 DIAGNOSIS — I513 Intracardiac thrombosis, not elsewhere classified: Secondary | ICD-10-CM | POA: Diagnosis not present

## 2019-08-08 LAB — ECHOCARDIOGRAM LIMITED
Calc EF: 38.1 %
S' Lateral: 5.14 cm
Single Plane A2C EF: 49 %
Single Plane A4C EF: 25.8 %

## 2019-08-08 NOTE — Progress Notes (Signed)
*  PRELIMINARY RESULTS* Echocardiogram Limited 2-D Echocardiogram has been performed.  [name] [name] 08/08/2019, 11:43 AM

## 2019-08-10 ENCOUNTER — Other Ambulatory Visit: Payer: Self-pay

## 2019-08-10 ENCOUNTER — Ambulatory Visit (INDEPENDENT_AMBULATORY_CARE_PROVIDER_SITE_OTHER): Payer: Medicare PPO | Admitting: Cardiology

## 2019-08-10 ENCOUNTER — Ambulatory Visit: Payer: Medicare PPO | Admitting: Cardiology

## 2019-08-10 ENCOUNTER — Encounter: Payer: Self-pay | Admitting: Cardiology

## 2019-08-10 ENCOUNTER — Encounter: Payer: Self-pay | Admitting: *Deleted

## 2019-08-10 VITALS — BP 111/69 | HR 61 | Ht 72.0 in | Wt 201.4 lb

## 2019-08-10 DIAGNOSIS — I5022 Chronic systolic (congestive) heart failure: Secondary | ICD-10-CM

## 2019-08-10 DIAGNOSIS — I4891 Unspecified atrial fibrillation: Secondary | ICD-10-CM | POA: Diagnosis not present

## 2019-08-10 DIAGNOSIS — I5023 Acute on chronic systolic (congestive) heart failure: Secondary | ICD-10-CM

## 2019-08-10 DIAGNOSIS — E876 Hypokalemia: Secondary | ICD-10-CM

## 2019-08-10 MED ORDER — SPIRONOLACTONE 25 MG PO TABS
12.5000 mg | ORAL_TABLET | Freq: Every day | ORAL | 1 refills | Status: DC
Start: 2019-08-10 — End: 2020-01-04

## 2019-08-10 NOTE — Patient Instructions (Signed)
Medication Instructions:  Your physician has recommended you make the following change in your medication:   1) Start Spironolactone 25 mg, 0.5 tablet by mouth once a day  *If you need a refill on your cardiac medications before your next appointment, please call your pharmacy*  Lab Work: Your physician recommends that you return for lab work in: 2 weeks for a CMET and Magnesium  Testing/Procedures: Your physician has requested that you have a cardiac MRI. Cardiac MRI uses a computer to create images of your heart as its beating, producing both still and moving pictures of your heart and major blood vessels. For further information please visit [email or web address] Please follow the instruction sheet given to you today for more information.  Follow-Up: At [facility], you and your health needs are our priority.  As part of our continuing mission to provide you with exceptional heart care, we have created designated Provider Care Teams.  These Care Teams include your primary Cardiologist (physician) and Advanced Practice Providers (APPs -  Physician Assistants and Nurse Practitioners) who all work together to provide you with the care you need, when you need it.  We recommend signing up for the patient portal called "MyChart".  Sign up information is provided on this After Visit Summary.  MyChart is used to connect with patients for Virtual Visits (Telemedicine).  Patients are able to view lab/test results, encounter notes, upcoming appointments, etc.  Non-urgent messages can be sent to your provider as well.   To learn more about what you can do with MyChart, go to [email or web address].    Your next appointment:   1 month(s)  The format for your next appointment:   In Person  Provider:   You may see Carlyle Dolly, MD or one of the following Advanced Practice Providers on your designated Care Team:    [place], PA-C   Ermalinda Barrios, [place]

## 2019-08-10 NOTE — Progress Notes (Signed)
Clinical Summary [name] [name] is a 64 y.o.male seen today for follow up of the following medical problems.   1. Chronic systolic HF -History of chronic systolic HF LVEF 55-01% by 2015 echo, has not followed up since 2016 in cardiology clinic. Cath in 2013 without significant disease, consistent with a NICM. - 04/2019 echo LVEF 25-30%, mod RV dysfunction    - admitted 06/2019 volume overloaded - 06/2019 echo LVEF 25-30%, grade II DDx, mod RV dysfunction - diuresed 225 to 206 lbs. Cr 1.24 at discharge. Unclear if the severe rise in Cr as outpatient was lab error or accurate  - home weight 208-210. Compliant with meds - some ongoing swelling that increased since discharge.   07/2019 echo: LVEF 20-25%, no LV thrombus.     - weight down to 201 lbs today - last labs 7/16 Cr 1.31, slight uptrend from hospital discharge in 06/2019 - home weights around 198-203 lbs - taking torsemide 8m in AM and 24mPM.  - no recent SOB/DOE. NO LE edema - compliatn with meds  2. LV thrombus - noted on echoduring 04/2019 admission -resolved by repeat echo, coumadin was changed to eliquis.    3. Afib - new diagnosis4/2021admission - last visit we started him on amio due to elevated HRs, soft bp's did not allow titration of beta blocker  - taking amiodarone 20042mid, changes 200m42mxt week.  - June 14 EKG SR, did have an EKG 6/18 in the hospital in afib. Still amio loading   4. CKD 3 - monitoring on diuretic  5. NSVT - in setting of systolic dysfunction, NICM   6. Cellulitis - seen in ER 08/03/19 with right foot cellulitis, started on keflex  Past Medical History:  Diagnosis Date  . Anxiety   . CHF (congestive heart failure) (HCC)[place] a. EF 15% in 2013 with cath showing normal cors b. EF 30-35% by repeat echo in 2015  . Chronic systolic heart failure (HCC)[place]. Diabetes mellitus   . Fluid retention in legs   . Hypertension      No Known Allergies   Current  Outpatient Medications  Medication Sig Dispense Refill  . ALPRAZolam (XANAX) 0.25 MG tablet Take 1 tablet (0.25 mg total) by mouth 3 (three) times daily as needed for anxiety. 20 tablet 0  . amiodarone (PACERONE) 200 MG tablet TAKE 400 MG TWICE DAILY FOR 1 WEEK THEN 200 MG TWICE DAILY FOR 2 WEEKS THEN TAKE 200 MG DAILY 180 tablet 1  . apixaban (ELIQUIS) 5 MG TABS tablet Take 1 tablet (5 mg total) by mouth 2 (two) times daily. 60 tablet 3  . cephALEXin (KEFLEX) 500 MG capsule Take 1 capsule (500 mg total) by mouth 4 (four) times daily. 40 capsule 0  . losartan (COZAAR) 25 MG tablet Take 0.5 tablets (12.5 mg total) by mouth daily. 45 tablet 3  . metFORMIN (GLUCOPHAGE) 500 MG tablet Take 500 mg by mouth 2 (two) times daily.    . metoprolol succinate (TOPROL-XL) 25 MG 24 hr tablet Take 1.5 tablets (37.5 mg total) by mouth daily. 135 tablet 3  . Oxycodone HCl 10 MG TABS Take 10 mg by mouth 4 (four) times daily as needed.    . potassium chloride SA (KLOR-CON) 20 MEQ tablet Take 2 tablets (40 mEq total) by mouth daily. 180 tablet 3  . simvastatin (ZOCOR) 40 MG tablet Take 1 tablet (40 mg total) by mouth at bedtime. 90 tablet 1  .  torsemide (DEMADEX) 20 MG tablet Take 52m PO in AM and 253mPO in PM. 90 tablet 3   No current facility-administered medications for this visit.     Past Surgical History:  Procedure Laterality Date  . COLONOSCOPY  01/04/2012   Procedure: COLONOSCOPY;  Surgeon: SaDanie BinderMD;  [facility];  Service: Endoscopy;  Laterality: N/A;  1:30 PM  . HERNIA REPAIR    . LEFT AND RIGHT HEART CATHETERIZATION WITH CORONARY ANGIOGRAM N/A 07/09/2011   Procedure: LEFT AND RIGHT HEART CATHETERIZATION WITH CORONARY ANGIOGRAM;  Surgeon: Peter M JoMartiniqueMD;  [facility];  Service: Cardiovascular;  Laterality: N/A;     No Known Allergies    Family History  Problem Relation Age of Onset  . Heart attack Father   . Heart failure Father   . Cancer Mother         Multiple myeloma  . Heart disease Sister      Social History [name] that he has been smoking cigarettes and e-cigarettes. He started smoking about 36 years ago. He has been smoking about 0.25 packs per day. He has never used smokeless tobacco. [name] previous alcohol use.   Review of Systems CONSTITUTIONAL: No weight loss, fever, chills, weakness or fatigue.  HEENT: Eyes: No visual loss, blurred vision, double vision or yellow sclerae.No hearing loss, sneezing, congestion, runny nose or sore throat.  SKIN: No rash or itching.  CARDIOVASCULAR: perhpi RESPIRATORY:per hpi GASTROINTESTINAL: No anorexia, nausea, vomiting or diarrhea. No abdominal pain or blood.  GENITOURINARY: No burning on urination, no polyuria NEUROLOGICAL: No headache, dizziness, syncope, paralysis, ataxia, numbness or tingling in the extremities. No change in bowel or bladder control.  MUSCULOSKELETAL: No muscle, back pain, joint pain or stiffness.  LYMPHATICS: No enlarged nodes. No history of splenectomy.  PSYCHIATRIC: No history of depression or anxiety.  ENDOCRINOLOGIC: No reports of sweating, cold or heat intolerance. No polyuria or polydipsia.  . Marland Kitchen Physical Examination Today's Vitals   08/10/19 1424  BP: 111/69  Pulse: 61  SpO2: 96%  Weight: 201 lb 6.4 oz (91.4 kg)  Height: 6' (1.829 m)   Body mass index is 27.31 kg/m.  Gen: resting comfortably, no acute distress HEENT: no scleral icterus, pupils equal round and reactive, no palptable cervical adenopathy,  CV: RRR, no m/r/g ,no jvd Resp: Clear to auscultation bilaterally GI: abdomen is soft, non-tender, non-distended, normal bowel sounds, no hepatosplenomegaly MSK: extremities are warm, no edema.  Skin: warm, no rash Neuro:  no focal deficits Psych: appropriate affect   Diagnostic Studies 06/2019 echo IMPRESSIONS    1. No LV thrombus noted . Left ventricular ejection fraction, by  estimation, is 25 to 30%. The left  ventricle has severely decreased  function. The left ventricle demonstrates global hypokinesis. The left  ventricular internal cavity size was moderately  dilated. There is mild left ventricular hypertrophy. Left ventricular  diastolic parameters are consistent with Grade II diastolic dysfunction  (pseudonormalization). Elevated left ventricular end-diastolic pressure.  2. Right ventricular systolic function is moderately reduced. The right  ventricular size is mildly enlarged. There is moderately elevated  pulmonary artery systolic pressure.  3. Left atrial size was moderately dilated.  4. Right atrial size was mildly dilated.  5. The mitral valve is normal in structure. No evidence of mitral valve  regurgitation. No evidence of mitral stenosis.  6. The aortic valve is tricuspid. Aortic valve regurgitation is not  visualized. No aortic stenosis is present.  7. The inferior vena cava is dilated in size with >50% respiratory  variability, suggesting right atrial pressure of 8 mmHg.    07/2019 echo IMPRESSIONS    1. Limited echo no doppler or color flow Compared to 07/03/19 no apical  thrombus noted on this exam either Some trabeculations at apex and  prominent papillary muscles no contrast/definity given EF remains severely  reduced . Left ventricular ejection  fraction, by estimation, is 20 to 25%. The left ventricle has severely  decreased function. The left ventricle demonstrates global hypokinesis.  The left ventricular internal cavity size was severely dilated. There is  moderate left ventricular hypertrophy.  2. Compared to 07/03/19 RV size appears smaller with normal function .  Right ventricular systolic function is normal. The right ventricular size  is normal.  3. The mitral valve was not assessed. No evidence of mitral valve  regurgitation. No evidence of mitral stenosis.  4. The aortic valve is normal in structure. Aortic valve regurgitation is  not visualized.  No aortic stenosis is present.  5. The inferior vena cava is normal in size with greater than 50%  respiratory variability, suggesting right atrial pressure of 3 mmHg.   Assessment and Plan  1. Chronic systolic HF - no significant symptoms - start aldactone 12.49m daily, CMET and Mg in 2 weeks - order cardiac MRI in setting of systolic HF   2. Afib -continue amiodarone - check liive panel - no symptoms      JCarlyle DollyMD

## 2019-08-13 ENCOUNTER — Ambulatory Visit: Payer: Medicare PPO | Admitting: Cardiovascular Disease

## 2019-08-20 ENCOUNTER — Ambulatory Visit: Payer: Medicare PPO | Admitting: Physician Assistant

## 2019-08-28 NOTE — Progress Notes (Signed)
Signed.

## 2019-08-31 ENCOUNTER — Other Ambulatory Visit (HOSPITAL_COMMUNITY)
Admission: RE | Admit: 2019-08-31 | Discharge: 2019-08-31 | Disposition: A | Discharge status: Home / Self Care | Payer: Medicare PPO | Source: Ambulatory Visit | Attending: Cardiology | Admitting: Cardiology

## 2019-08-31 DIAGNOSIS — I5023 Acute on chronic systolic (congestive) heart failure: Secondary | ICD-10-CM | POA: Diagnosis present

## 2019-08-31 DIAGNOSIS — I4891 Unspecified atrial fibrillation: Secondary | ICD-10-CM | POA: Insufficient documentation

## 2019-08-31 DIAGNOSIS — E876 Hypokalemia: Secondary | ICD-10-CM | POA: Insufficient documentation

## 2019-08-31 LAB — COMPREHENSIVE METABOLIC PANEL
ALT: 17 U/L (ref 0–44)
AST: 19 U/L (ref 15–41)
Albumin: 3.8 g/dL (ref 3.5–5.0)
Alkaline Phosphatase: 71 U/L (ref 38–126)
Anion gap: 13 (ref 5–15)
BUN: 20 mg/dL (ref 8–23)
CO2: 25 mmol/L (ref 22–32)
Calcium: 8.7 mg/dL — ABNORMAL LOW (ref 8.9–10.3)
Chloride: 99 mmol/L (ref 98–111)
Creatinine, Ser: 1.52 mg/dL — ABNORMAL HIGH (ref 0.61–1.24)
GFR calc Af Amer: 56 mL/min — ABNORMAL LOW (ref >60)
GFR calc non Af Amer: 48 mL/min — ABNORMAL LOW (ref >60)
Glucose, Bld: 224 mg/dL — ABNORMAL HIGH (ref 70–99)
Potassium: 3.9 mmol/L (ref 3.5–5.1)
Sodium: 137 mmol/L (ref 135–145)
Total Bilirubin: 0.7 mg/dL (ref 0.3–1.2)
Total Protein: 7.7 g/dL (ref 6.5–8.1)

## 2019-08-31 LAB — MAGNESIUM: Magnesium: 1.7 mg/dL (ref 1.7–2.4)

## 2019-09-11 NOTE — Progress Notes (Signed)
Cardiology Office Note    Date:  09/17/2019   ID:  Thijs, Brunton 08/23/1955, MRN [id number]  PCP:  Lucia Gaskins, MD  Cardiologist: Carlyle Dolly, MD EPS: None  No chief complaint on file.   History of Present Illness:  [name] [name] is a 64 y.o. male with history of Chronic systolic CHF ejection fraction 30 to 35% on echo 2015 follow-up echo 04/2019 LVEF 25 to 30% with moderate RV dysfunction.  Follow-up echo 06/2019 EF 25 to 30% with grade 2 DD moderate RV dysfunction.  Echo 07/2019 LVEF 20 to 25% no LV thrombus. recently saw Dr. Harl Bowie 08/10/2019 and started on spironolactone.  Home weights 201 pounds, LV thrombus 04/2019 resolved on repeat echo 07/2019-Coumadin changed to Eliquis, Atrial fibrillation 04/2019 treated with amiodarone due to elevated heart rates and soft blood pressures.  Was in sinus rhythm 07/02/2019 but in A. fib 07/06/2019.NSVT in the setting of systolic dysfunction, nonischemic cardiomyopathy, CKD stage III.  Patient comes in for f/u. Denies chest pain, dyspnea, dizziness or presyncope. Smoking 1-3 cigarettes/week. Crt 1.52 08/31/19 up from 1.31.     Past Medical History:  Diagnosis Date  . Anxiety   . CHF (congestive heart failure) ([place])    a. EF 15% in 2013 with cath showing normal cors b. EF 30-35% by repeat echo in 2015  . Chronic systolic heart failure ([place])   . Diabetes mellitus   . Fluid retention in legs   . Hypertension     Past Surgical History:  Procedure Laterality Date  . COLONOSCOPY  01/04/2012   Procedure: COLONOSCOPY;  Surgeon: Danie Binder, MD;  [facility];  Service: Endoscopy;  Laterality: N/A;  1:30 PM  . HERNIA REPAIR    . LEFT AND RIGHT HEART CATHETERIZATION WITH CORONARY ANGIOGRAM N/A 07/09/2011   Procedure: LEFT AND RIGHT HEART CATHETERIZATION WITH CORONARY ANGIOGRAM;  Surgeon: Peter M Martinique, MD;  Location: [facility] CATH LAB;  Service: Cardiovascular;  Laterality: N/A;    Current Medications: Current Meds   Medication Sig  . ALPRAZolam (XANAX) 0.25 MG tablet Take 1 tablet (0.25 mg total) by mouth 3 (three) times daily as needed for anxiety.  Marland Kitchen amiodarone (PACERONE) 200 MG tablet Take 200 mg by mouth daily.  Marland Kitchen losartan (COZAAR) 25 MG tablet Take 0.5 tablets (12.5 mg total) by mouth daily.  . metFORMIN (GLUCOPHAGE) 500 MG tablet Take 500 mg by mouth 2 (two) times daily.  . metoprolol succinate (TOPROL-XL) 25 MG 24 hr tablet Take 1.5 tablets (37.5 mg total) by mouth daily.  . Oxycodone HCl 10 MG TABS Take 10 mg by mouth 4 (four) times daily as needed (for pain).   . potassium chloride SA (KLOR-CON) 20 MEQ tablet Take 2 tablets (40 mEq total) by mouth daily.  . simvastatin (ZOCOR) 40 MG tablet Take 1 tablet (40 mg total) by mouth at bedtime.  Marland Kitchen spironolactone (ALDACTONE) 25 MG tablet Take 0.5 tablets (12.5 mg total) by mouth daily.  Marland Kitchen torsemide (DEMADEX) 20 MG tablet Take 2 tablets (40 mg total) by mouth daily.     Allergies:   Patient has no known allergies.   Social History   Socioeconomic History  . Marital status: Married    Spouse name: Not on file  . Number of children: 0  . Years of education: Not on file  . Highest education level: Not on file  Occupational History  . Occupation: disabled  Tobacco Use  . Smoking status: Current Some Day Smoker  Packs/day: 0.25    Types: Cigarettes, E-cigarettes    Start date: 10/13/1982    Last attempt to quit: 09/19/2011    Years since quitting: 8.0  . Smokeless tobacco: Never Used  . Tobacco comment: electronic cig for 4 months   Vaping Use  . Vaping Use: Never used  Substance and Sexual Activity  . Alcohol use: Not Currently    Comment: weekends  . Drug use: No  . Sexual activity: Not on file  Other Topics Concern  . Not on file  Social History Narrative   Patient is right handed.   Patient seldom drinks caffeine.   Social Determinants of Health   Financial Resource Strain:   . Difficulty of Paying Living Expenses: Not on file   Food Insecurity:   . Worried About Charity fundraiser in the Last Year: Not on file  . Ran Out of Food in the Last Year: Not on file  Transportation Needs:   . Lack of Transportation (Medical): Not on file  . Lack of Transportation (Non-Medical): Not on file  Physical Activity:   . Days of Exercise per Week: Not on file  . Minutes of Exercise per Session: Not on file  Stress:   . Feeling of Stress : Not on file  Social Connections:   . Frequency of Communication with Friends and Family: Not on file  . Frequency of Social Gatherings with Friends and Family: Not on file  . Attends Religious Services: Not on file  . Active Member of Clubs or Organizations: Not on file  . Attends Archivist Meetings: Not on file  . Marital Status: Not on file     Family History:  The patient's family history includes Cancer in his mother; Heart attack in his father; Heart disease in his sister; Heart failure in his father.   ROS:   Please see the history of present illness.    ROS All other systems reviewed and are negative.   PHYSICAL EXAM:   VS:  BP 120/66   Pulse 60   Ht 6' (1.829 m)   Wt 204 lb 12.8 oz (92.9 kg)   SpO2 95%   BMI 27.78 kg/m   Physical Exam  GEN: Well nourished, well developed, in no acute distress  Neck: no JVD, carotid bruits, or masses Cardiac:RRR; no murmurs, rubs, or gallops  Respiratory: Decreased breath sounds throughout clear to auscultation bilaterally, normal work of breathing GI: soft, nontender, nondistended, + BS Ext: without cyanosis, clubbing, or edema, Good distal pulses bilaterally Neuro:  Alert and Oriented x 3 Psych: euthymic mood, full affect  Wt Readings from Last 3 Encounters:  09/17/19 204 lb 12.8 oz (92.9 kg)  08/10/19 201 lb 6.4 oz (91.4 kg)  08/03/19 200 lb (90.7 kg)      Studies/Labs Reviewed:   EKG:  EKG is not ordered today.    Recent Labs: 04/23/2019: TSH 4.363 07/02/2019: B Natriuretic Peptide 2,735.0 08/03/2019:  Hemoglobin 14.5; Platelets 201 08/31/2019: ALT 17; BUN 20; Creatinine, Ser 1.52; Magnesium 1.7; Potassium 3.9; Sodium 137   Lipid Panel    Component Value Date/Time   CHOL 132 08/14/2014 1030   TRIG 134 08/14/2014 1030   HDL 50 08/14/2014 1030   CHOLHDL 2.6 08/14/2014 1030   VLDL 27 08/14/2014 1030   LDLCALC 55 08/14/2014 1030    Additional studies/ records that were reviewed today include:  Limited echo 08/08/19 IMPRESSIONS     1. Limited echo no doppler or color flow Compared  to 07/03/19 no apical  thrombus noted on this exam either Some trabeculations at apex and  prominent papillary muscles no contrast/definity given EF remains severely  reduced . Left ventricular ejection  fraction, by estimation, is 20 to 25%. The left ventricle has severely  decreased function. The left ventricle demonstrates global hypokinesis.  The left ventricular internal cavity size was severely dilated. There is  moderate left ventricular hypertrophy.   2. Compared to 07/03/19 RV size appears smaller with normal function .  Right ventricular systolic function is normal. The right ventricular size  is normal.   3. The mitral valve was not assessed. No evidence of mitral valve  regurgitation. No evidence of mitral stenosis.   4. The aortic valve is normal in structure. Aortic valve regurgitation is  not visualized. No aortic stenosis is present.   5. The inferior vena cava is normal in size with greater than 50%  respiratory variability, suggesting right atrial pressure of 3 mmHg.   FINDINGS   Left Ventricle: Limited echo no doppler or color flow Compared to 07/03/19  no apical thrombus noted on this exam either Some trabeculations at apex  and prominent papillary muscles no contrast/definity given EF remains  severely reduced. Left ventricular  ejection fraction, by estimation, is 20 to 25%. The left ventricle has  severely decreased function. The left ventricle demonstrates global  hypokinesis. The  left ventricular internal cavity size was severely  dilated. There is moderate left ventricular  hypertrophy.   Right Ventricle: Compared to 07/03/19 RV size appears smaller with normal  function. The right ventricular size is normal. No increase in right  ventricular wall thickness. Right ventricular systolic function is normal.   Left Atrium: Left atrial size was not assessed.   Right Atrium: Right atrial size was not assessed.   Pericardium: There is no evidence of pericardial effusion.   Mitral Valve: The mitral valve was not assessed. There is mild thickening  of the mitral valve leaflet(s). There is mild calcification of the mitral  valve leaflet(s). Normal mobility of the mitral valve leaflets. Mild  mitral annular calcification. No  evidence of mitral valve stenosis.   Tricuspid Valve: The tricuspid valve is not assessed. Tricuspid valve  regurgitation is not demonstrated. No evidence of tricuspid stenosis.   Aortic Valve: The aortic valve is normal in structure. Aortic valve  regurgitation is not visualized. No aortic stenosis is present.   Pulmonic Valve: The pulmonic valve was not assessed. Pulmonic valve  regurgitation is not visualized. No evidence of pulmonic stenosis.   Aorta: The aortic root is normal in size and structure and aortic root  could not be assessed.   Venous: The inferior vena cava is normal in size with greater than 50%  respiratory variability, suggesting right atrial pressure of 3 mmHg.   IAS/Shunts: The interatrial septum was not assessed.   Echo 07/03/19 IMPRESSIONS     1. No LV thrombus noted . Left ventricular ejection fraction, by  estimation, is 25 to 30%. The left ventricle has severely decreased  function. The left ventricle demonstrates global hypokinesis. The left  ventricular internal cavity size was moderately  dilated. There is mild left ventricular hypertrophy. Left ventricular  diastolic parameters are consistent with Grade II  diastolic dysfunction  (pseudonormalization). Elevated left ventricular end-diastolic pressure.   2. Right ventricular systolic function is moderately reduced. The right  ventricular size is mildly enlarged. There is moderately elevated  pulmonary artery systolic pressure.   3. Left atrial size was  moderately dilated.   4. Right atrial size was mildly dilated.   5. The mitral valve is normal in structure. No evidence of mitral valve  regurgitation. No evidence of mitral stenosis.   6. The aortic valve is tricuspid. Aortic valve regurgitation is not  visualized. No aortic stenosis is present.   7. The inferior vena cava is dilated in size with >50% respiratory  variability, suggesting right atrial pressure of 8 mmHg.   FINDINGS   Left Ventricle: No LV thrombus noted. Left ventricular ejection fraction,  by estimation, is 25 to 30%. The left ventricle has severely decreased  function. The left ventricle demonstrates global hypokinesis. The left  ventricular internal cavity size was  moderately dilated. There is mild left ventricular hypertrophy. Left  ventricular diastolic parameters are consistent with Grade II diastolic  dysfunction (pseudonormalization). Elevated left ventricular end-diastolic  pressure.   Right Ventricle: The right ventricular size is mildly enlarged. Right  vetricular wall thickness was not assessed. Right ventricular systolic  function is moderately reduced. There is moderately elevated pulmonary  artery systolic pressure. The tricuspid  regurgitant velocity is 3.31 m/s, and with an assumed right atrial  pressure of 10 mmHg, the estimated right ventricular systolic pressure is  26.3 mmHg.   Left Atrium: Left atrial size was moderately dilated.   Right Atrium: Right atrial size was mildly dilated.   Pericardium: There is no evidence of pericardial effusion.   Mitral Valve: The mitral valve is normal in structure. Normal mobility of  the mitral valve  leaflets. No evidence of mitral valve regurgitation. No  evidence of mitral valve stenosis.   Tricuspid Valve: The tricuspid valve is normal in structure. Tricuspid  valve regurgitation is mild . No evidence of tricuspid stenosis.   Aortic Valve: The aortic valve is tricuspid. Aortic valve regurgitation is  not visualized. No aortic stenosis is present.   Pulmonic Valve: The pulmonic valve was normal in structure. Pulmonic valve  regurgitation is mild. No evidence of pulmonic stenosis.   Aorta: The aortic root is normal in size and structure.   Venous: The inferior vena cava is dilated in size with greater than 50%  respiratory variability, suggesting right atrial pressure of 8 mmHg.   IAS/Shunts: No atrial level shunt detected by color flow Doppler.     ASSESSMENT:    1. Chronic combined systolic and diastolic CHF (congestive heart failure) ([place])   2. LV (left ventricular) mural thrombus   3. Paroxysmal atrial fibrillation (HCC)   4. NSVT (nonsustained ventricular tachycardia) ([place])   5. Chronic renal failure, stage 3a      PLAN:  In order of problems listed above:  Chronic systolic CHF ejection fraction 30 to 35% on echo 2015 follow-up echo 04/2019 LVEF 25 to 30% with moderate RV dysfunction.  Follow-up echo 06/2019 EF 25 to 30% with grade 2 DD moderate RV dysfunction.  Echo 07/2019 LVEF 20 to 25% no LV thrombus recently saw Dr. Harl Bowie 08/10/2019 and started on spironolactone.  Home weights 201 pounds, here 201 lbs. creatinine 1.52 08/31/2019 which is climbing up.  No recent heart failure.  We will try to decrease Demadex to 20 mg once daily.  Can take an extra 20 mg for weight gain of 2 or 3 pounds overnight or increase edema.  Repeat be met in 2 to 3 weeks.  LV thrombus 04/2019 resolved on repeat echo 07/2019-Coumadin changed to Eliquis  Atrial fibrillation 04/2019 treated with amiodarone due to elevated heart rates and soft blood pressures.  Was in sinus rhythm 07/02/2019 but in A.  fib 07/06/2019.  Heart rate regular today.  Recommend regular exercise and complete smoking cessation  NSVT in the setting of systolic dysfunction, nonischemic cardiomyopathy  CKD stage III creatinine up to 1.52 on recent labs.  We will try to decrease Demadex.      Medication Adjustments/Labs and Tests Ordered: Current medicines are reviewed at length with the patient today.  Concerns regarding medicines are outlined above.  Medication changes, Labs and Tests ordered today are listed in the Patient Instructions below. There are no Patient Instructions on file for this visit.   Signed, Ermalinda Barrios, PA-C  09/17/2019 2:12 PM    [facility] [place], [place], [place]  [address] Phone: [phone]; Fax: [phone]

## 2019-09-12 ENCOUNTER — Telehealth: Payer: Self-pay | Admitting: *Deleted

## 2019-09-12 MED ORDER — TORSEMIDE 20 MG PO TABS: 40.0000 mg | ORAL_TABLET | Freq: Every day | ORAL | 3 refills | Status: DC

## 2019-09-12 NOTE — Telephone Encounter (Signed)
Order placed

## 2019-09-12 NOTE — Telephone Encounter (Signed)
-----   Message from Arnoldo Lenis, MD sent at 09/07/2019  4:09 PM EDT ----- SOme increase stress on kidneys, would lower torsemide to 40mg  daily   J BrancH MD

## 2019-09-17 ENCOUNTER — Other Ambulatory Visit: Payer: Self-pay

## 2019-09-17 ENCOUNTER — Encounter: Payer: Self-pay | Admitting: Physician Assistant

## 2019-09-17 ENCOUNTER — Ambulatory Visit: Payer: Medicare PPO | Admitting: Physician Assistant

## 2019-09-17 VITALS — BP 120/66 | HR 60 | Ht 72.0 in | Wt 204.8 lb

## 2019-09-17 DIAGNOSIS — I480 Paroxysmal atrial fibrillation: Secondary | ICD-10-CM

## 2019-09-17 DIAGNOSIS — I472 Ventricular tachycardia: Secondary | ICD-10-CM

## 2019-09-17 DIAGNOSIS — I513 Intracardiac thrombosis, not elsewhere classified: Secondary | ICD-10-CM

## 2019-09-17 DIAGNOSIS — I5042 Chronic combined systolic (congestive) and diastolic (congestive) heart failure: Secondary | ICD-10-CM

## 2019-09-17 DIAGNOSIS — N1831 Chronic kidney disease, stage 3a: Secondary | ICD-10-CM

## 2019-09-17 DIAGNOSIS — I4729 Other ventricular tachycardia: Secondary | ICD-10-CM

## 2019-09-17 MED ORDER — TORSEMIDE 20 MG PO TABS: 20.0000 mg | ORAL_TABLET | Freq: Every day | ORAL | 3 refills | Status: DC

## 2019-09-17 NOTE — Patient Instructions (Signed)
Medication Instructions:  DECREASE Demadex to 20 mg daily     *If you need a refill on your cardiac medications before your next appointment, please call your pharmacy*   Lab Work: BMET in 2 weeks 9/13   If you have labs (blood work) drawn today and your tests are completely normal, you will receive your results only by: Marland Kitchen MyChart Message (if you have MyChart) OR . A paper copy in the mail If you have any lab test that is abnormal or we need to change your treatment, we will call you to review the results.   Testing/Procedures: None today   Follow-Up: At [facility], you and your health needs are our priority.  As part of our continuing mission to provide you with exceptional heart care, we have created designated Provider Care Teams.  These Care Teams include your primary Cardiologist (physician) and Advanced Practice Providers (APPs -  Physician Assistants and Nurse Practitioners) who all work together to provide you with the care you need, when you need it.  We recommend signing up for the patient portal called "MyChart".  Sign up information is provided on this After Visit Summary.  MyChart is used to connect with patients for Virtual Visits (Telemedicine).  Patients are able to view lab/test results, encounter notes, upcoming appointments, etc.  Non-urgent messages can be sent to your provider as well.   To learn more about what you can do with MyChart, go to [email or web address].    Your next appointment:   4 month(s)  The format for your next appointment:   In Person  Provider:   Carlyle Dolly, MD   Other Instructions None      Thank you for choosing [facility] !

## 2019-09-17 NOTE — Addendum Note (Signed)
Addended by: Barbarann Ehlers A on: 09/17/2019 02:29 PM   Modules accepted: Orders

## 2019-10-01 ENCOUNTER — Other Ambulatory Visit: Payer: Self-pay

## 2019-10-01 ENCOUNTER — Other Ambulatory Visit (HOSPITAL_COMMUNITY)
Admission: RE | Admit: 2019-10-01 | Discharge: 2019-10-01 | Disposition: A | Discharge status: Home / Self Care | Payer: Medicare PPO | Source: Ambulatory Visit | Attending: Physician Assistant | Admitting: Physician Assistant

## 2019-10-01 DIAGNOSIS — I480 Paroxysmal atrial fibrillation: Secondary | ICD-10-CM | POA: Insufficient documentation

## 2019-10-01 DIAGNOSIS — I5042 Chronic combined systolic (congestive) and diastolic (congestive) heart failure: Secondary | ICD-10-CM | POA: Diagnosis present

## 2019-10-01 LAB — BASIC METABOLIC PANEL
Anion gap: 12 (ref 5–15)
BUN: 18 mg/dL (ref 8–23)
CO2: 27 mmol/L (ref 22–32)
Calcium: 8.8 mg/dL — ABNORMAL LOW (ref 8.9–10.3)
Chloride: 97 mmol/L — ABNORMAL LOW (ref 98–111)
Creatinine, Ser: 1.5 mg/dL — ABNORMAL HIGH (ref 0.61–1.24)
GFR calc Af Amer: 57 mL/min — ABNORMAL LOW (ref >60)
GFR calc non Af Amer: 49 mL/min — ABNORMAL LOW (ref >60)
Glucose, Bld: 181 mg/dL — ABNORMAL HIGH (ref 70–99)
Potassium: 3.6 mmol/L (ref 3.5–5.1)
Sodium: 136 mmol/L (ref 135–145)

## 2019-10-02 ENCOUNTER — Telehealth: Payer: Self-pay

## 2019-10-02 DIAGNOSIS — Z79899 Other long term (current) drug therapy: Secondary | ICD-10-CM

## 2019-10-02 NOTE — Telephone Encounter (Signed)
I spoke with patient. Lab results discussed. Patient would like bmet mailed to his house.

## 2019-10-02 NOTE — Telephone Encounter (Signed)
-----   Message from Liliane Shi, [place] sent at 10/01/2019  5:40 PM EDT ----- Results reviewed for [name] Barrios, PA-C  Creatinine elevated but stable.  K+ normal.  Calcium remains low/stable.   PLAN:   - Continue current medications/treatment plan and follow up as scheduled.  - Repeat BMET in 2 weeks - put order under [name] Barrios, PA-C so results go to her Richardson Dopp, Hershal Coria 10/01/2019 5:35 PM

## 2019-10-29 ENCOUNTER — Other Ambulatory Visit: Payer: Self-pay

## 2019-10-29 ENCOUNTER — Other Ambulatory Visit (HOSPITAL_COMMUNITY)
Admission: RE | Admit: 2019-10-29 | Discharge: 2019-10-29 | Disposition: A | Discharge status: Home / Self Care | Payer: Medicare PPO | Source: Ambulatory Visit | Attending: Physician Assistant | Admitting: Physician Assistant

## 2019-10-29 DIAGNOSIS — Z79899 Other long term (current) drug therapy: Secondary | ICD-10-CM | POA: Diagnosis present

## 2019-10-29 LAB — BASIC METABOLIC PANEL
Anion gap: 11 (ref 5–15)
BUN: 17 mg/dL (ref 8–23)
CO2: 27 mmol/L (ref 22–32)
Calcium: 8.8 mg/dL — ABNORMAL LOW (ref 8.9–10.3)
Chloride: 99 mmol/L (ref 98–111)
Creatinine, Ser: 1.65 mg/dL — ABNORMAL HIGH (ref 0.61–1.24)
GFR, Estimated: 44 mL/min — ABNORMAL LOW (ref >60)
Glucose, Bld: 168 mg/dL — ABNORMAL HIGH (ref 70–99)
Potassium: 3.6 mmol/L (ref 3.5–5.1)
Sodium: 137 mmol/L (ref 135–145)

## 2019-10-30 ENCOUNTER — Telehealth: Payer: Self-pay | Admitting: *Deleted

## 2019-10-30 DIAGNOSIS — Z79899 Other long term (current) drug therapy: Secondary | ICD-10-CM

## 2019-10-30 DIAGNOSIS — I5022 Chronic systolic (congestive) heart failure: Secondary | ICD-10-CM

## 2019-10-30 NOTE — Telephone Encounter (Signed)
-----   Message from Imogene Burn, PA-C sent at 10/29/2019  4:03 PM EDT ----- Kidney funtion up more than 4 weeks ok. Ask him if he's been taking extra demadex. Otherwise no changes today. Repeat bmet in 4 weeks

## 2019-10-30 NOTE — Telephone Encounter (Signed)
Pt notified of lab results and the need to have repeat labwork in 4 weeks

## 2019-11-12 ENCOUNTER — Other Ambulatory Visit: Payer: Self-pay | Admitting: *Deleted

## 2019-11-12 MED ORDER — AMIODARONE HCL 200 MG PO TABS
200.0000 mg | ORAL_TABLET | Freq: Every day | ORAL | 3 refills | Status: DC
Start: 2019-11-12 — End: 2019-11-15

## 2019-11-15 ENCOUNTER — Other Ambulatory Visit: Payer: Self-pay

## 2019-11-15 ENCOUNTER — Other Ambulatory Visit: Payer: Self-pay | Admitting: Cardiology

## 2019-11-15 MED ORDER — AMIODARONE HCL 200 MG PO TABS
200.0000 mg | ORAL_TABLET | Freq: Every day | ORAL | 3 refills | Status: DC
Start: 2019-11-15 — End: 2020-05-07

## 2019-11-15 MED ORDER — APIXABAN 5 MG PO TABS: 5.0000 mg | ORAL_TABLET | Freq: Two times a day (BID) | ORAL | 11 refills | Status: DC

## 2019-11-15 NOTE — Telephone Encounter (Signed)
New message    Out of his medication for 4 days  *STAT* If patient is at the pharmacy, call can be transferred to refill team.   1. Which medications need to be refilled? (please list name of each medication and dose if known) eliquis   2. Which pharmacy/location (including street and city if local pharmacy) is medication to be sent to? [facility]  3. Do they need a 30 day or 90 day supply? [place]

## 2019-11-15 NOTE — Telephone Encounter (Signed)
Refilled amiodarone 200 mg qd, #90 with RF: 3 to [facility]

## 2019-11-15 NOTE — Telephone Encounter (Signed)
refilled eliquis as requested

## 2019-11-27 ENCOUNTER — Other Ambulatory Visit (HOSPITAL_COMMUNITY)
Admission: RE | Admit: 2019-11-27 | Discharge: 2019-11-27 | Disposition: A | Discharge status: Home / Self Care | Payer: Medicare PPO | Source: Ambulatory Visit | Attending: Physician Assistant | Admitting: Physician Assistant

## 2019-11-27 ENCOUNTER — Other Ambulatory Visit: Payer: Self-pay

## 2019-11-27 DIAGNOSIS — I5022 Chronic systolic (congestive) heart failure: Secondary | ICD-10-CM | POA: Diagnosis present

## 2019-11-27 DIAGNOSIS — Z79899 Other long term (current) drug therapy: Secondary | ICD-10-CM | POA: Insufficient documentation

## 2019-11-27 LAB — BASIC METABOLIC PANEL
Anion gap: 11 (ref 5–15)
BUN: 26 mg/dL — ABNORMAL HIGH (ref 8–23)
CO2: 25 mmol/L (ref 22–32)
Calcium: 8.6 mg/dL — ABNORMAL LOW (ref 8.9–10.3)
Chloride: 100 mmol/L (ref 98–111)
Creatinine, Ser: 1.74 mg/dL — ABNORMAL HIGH (ref 0.61–1.24)
GFR, Estimated: 44 mL/min — ABNORMAL LOW (ref >60)
Glucose, Bld: 250 mg/dL — ABNORMAL HIGH (ref 70–99)
Potassium: 3.9 mmol/L (ref 3.5–5.1)
Sodium: 136 mmol/L (ref 135–145)

## 2019-11-29 ENCOUNTER — Telehealth: Payer: Self-pay | Admitting: *Deleted

## 2019-11-29 DIAGNOSIS — Z79899 Other long term (current) drug therapy: Secondary | ICD-10-CM

## 2019-11-29 MED ORDER — TORSEMIDE 20 MG PO TABS: 10.0000 mg | ORAL_TABLET | Freq: Every day | ORAL | 3 refills | Status: DC

## 2019-11-29 NOTE — Telephone Encounter (Signed)
-----   Message from Imogene Burn, PA-C sent at 11/27/2019  3:06 PM EST ----- Kidney function up higher. If he's taking demadex 20 mg once daily, see if he can reduce it to   1/2 daily . Also reduce Kdur 40 meq once daily. Repeat bmet in 1 month. Can take extra demadex if weight gain of 2-3 lbs overnight. thanks

## 2019-11-29 NOTE — Telephone Encounter (Signed)
Pt notified orders placed  

## 2020-01-03 ENCOUNTER — Other Ambulatory Visit: Payer: Self-pay

## 2020-01-03 ENCOUNTER — Other Ambulatory Visit (HOSPITAL_COMMUNITY)
Admission: RE | Admit: 2020-01-03 | Discharge: 2020-01-03 | Disposition: A | Discharge status: Home / Self Care | Payer: Medicare PPO | Source: Ambulatory Visit | Attending: Physician Assistant | Admitting: Physician Assistant

## 2020-01-03 DIAGNOSIS — Z79899 Other long term (current) drug therapy: Secondary | ICD-10-CM | POA: Diagnosis present

## 2020-01-03 LAB — BASIC METABOLIC PANEL
Anion gap: 10 (ref 5–15)
BUN: 16 mg/dL (ref 8–23)
CO2: 22 mmol/L (ref 22–32)
Calcium: 8.6 mg/dL — ABNORMAL LOW (ref 8.9–10.3)
Chloride: 101 mmol/L (ref 98–111)
Creatinine, Ser: 1.57 mg/dL — ABNORMAL HIGH (ref 0.61–1.24)
GFR, Estimated: 49 mL/min — ABNORMAL LOW (ref >60)
Glucose, Bld: 278 mg/dL — ABNORMAL HIGH (ref 70–99)
Potassium: 4.7 mmol/L (ref 3.5–5.1)
Sodium: 133 mmol/L — ABNORMAL LOW (ref 135–145)

## 2020-01-04 ENCOUNTER — Encounter: Payer: Self-pay | Admitting: Cardiology

## 2020-01-04 ENCOUNTER — Other Ambulatory Visit: Payer: Self-pay

## 2020-01-04 ENCOUNTER — Ambulatory Visit: Payer: Medicare PPO | Admitting: Cardiology

## 2020-01-04 VITALS — BP 122/80 | HR 61 | Ht 72.0 in | Wt 216.0 lb

## 2020-01-04 DIAGNOSIS — I5022 Chronic systolic (congestive) heart failure: Secondary | ICD-10-CM

## 2020-01-04 DIAGNOSIS — I4891 Unspecified atrial fibrillation: Secondary | ICD-10-CM | POA: Diagnosis not present

## 2020-01-04 MED ORDER — SPIRONOLACTONE 25 MG PO TABS: 25.0000 mg | ORAL_TABLET | Freq: Every day | ORAL | 3 refills | Status: DC

## 2020-01-04 NOTE — Patient Instructions (Signed)
Medication Instructions:  Your physician has recommended you make the following change in your medication:   Increase Aldactone to 25 mg Daily   *If you need a refill on your cardiac medications before your next appointment, please call your pharmacy*   Lab Work: Your physician recommends that you return for lab work in:  2 Weeks ( 01-18-20)   If you have labs (blood work) drawn today and your tests are completely normal, you will receive your results only by: Marland Kitchen MyChart Message (if you have MyChart) OR . A paper copy in the mail If you have any lab test that is abnormal or we need to change your treatment, we will call you to review the results.   Testing/Procedures: Your physician has requested that you have a cardiac MRI. Cardiac MRI uses a computer to create images of your heart as its beating, producing both still and moving pictures of your heart and major blood vessels. For further information please visit [email or web address] Please follow the instruction sheet given to you today for more information.    Follow-Up: At [facility], you and your health needs are our priority.  As part of our continuing mission to provide you with exceptional heart care, we have created designated Provider Care Teams.  These Care Teams include your primary Cardiologist (physician) and Advanced Practice Providers (APPs -  Physician Assistants and Nurse Practitioners) who all work together to provide you with the care you need, when you need it.  We recommend signing up for the patient portal called "MyChart".  Sign up information is provided on this After Visit Summary.  MyChart is used to connect with patients for Virtual Visits (Telemedicine).  Patients are able to view lab/test results, encounter notes, upcoming appointments, etc.  Non-urgent messages can be sent to your provider as well.   To learn more about what you can do with MyChart, go to [email or web address].    Your next appointment:    2 month(s)  The format for your next appointment:   In Person  Provider:   Dr. Harl Bowie    Other Instructions Thank you for choosing [facility]!

## 2020-01-04 NOTE — Progress Notes (Signed)
Clinical Summary [name] is a 64 y.o.male seen today for follow up of the following medical problems.   1. Chronic systolic HF -History of chronic systolic HF LVEF 53-79% by 2015 echo, has not followed up since 2016 in cardiology clinic. Cath in 2013 without significant disease, consistent with a NICM. - 04/2019 echo LVEF 25-30%, mod RV dysfunction    - admitted 06/2019 volume overloaded - 06/2019 echo LVEF 25-30%, grade II DDx, mod RV dysfunction - diuresed 225 to 206 lbs. Cr 1.24 at discharge. Unclear if the severe rise in Cr as outpatient was lab error or accurate  - home weight 208-210. Compliant with meds - some ongoing swelling that increasedsince discharge.  07/2019 echo: LVEF 20-25%, no LV thrombus.   - weight up 12 lbs from 08/2019.  - denies any LE edema, no SOB/DOE. Unsure if abdominal distension, reports high calorie diet.. Reports gradual weight gain - compliant with meds -11/27/19 Cr trended down. Toresemide lowered to 22m, repeat Cr has come down.  - has not had cardiac MRI since we discussed at our last visit   2. LV thrombus - noted on echoduring 04/2019 admission -resolved by repeat echo, coumadin was changed to eliquis.   3. Afib - new diagnosis4/2021admission -  we started him on amio due to elevated HRs, soft bp's did not allow titration of beta blocker   - no recent palpitations - no bleeding on eliquis  4. CKD 3 - monitoring on diuretic  5. NSVT - in setting of systolic dysfunction, NICM     Past Medical History:  Diagnosis Date  . Anxiety   . CHF (congestive heart failure) ([place]    a. EF 15% in 2013 with cath showing normal cors b. EF 30-35% by repeat echo in 2015  . [place]   . Diabetes mellitus   . Fluid retention in legs   . Hypertension      No Known Allergies   Current Outpatient Medications  Medication Sig Dispense Refill  . ALPRAZolam (XANAX) 0.25 MG tablet Take 1  tablet (0.25 mg total) by mouth 3 (three) times daily as needed for anxiety. 20 tablet 0  . amiodarone (PACERONE) 200 MG tablet Take 1 tablet (200 mg total) by mouth daily. 90 tablet 3  . apixaban (ELIQUIS) 5 MG TABS tablet Take 1 tablet (5 mg total) by mouth 2 (two) times daily. 60 tablet 11  . losartan (COZAAR) 25 MG tablet Take 0.5 tablets (12.5 mg total) by mouth daily. 45 tablet 3  . metFORMIN (GLUCOPHAGE) 500 MG tablet Take 500 mg by mouth 2 (two) times daily.    . metoprolol succinate (TOPROL-XL) 25 MG 24 hr tablet Take 1.5 tablets (37.5 mg total) by mouth daily. 135 tablet 3  . Oxycodone HCl 10 MG TABS Take 10 mg by mouth 4 (four) times daily as needed (for pain).     . potassium chloride SA (KLOR-CON) 20 MEQ tablet Take 2 tablets (40 mEq total) by mouth daily. 180 tablet 3  . simvastatin (ZOCOR) 40 MG tablet Take 1 tablet (40 mg total) by mouth at bedtime. 90 tablet 1  . spironolactone (ALDACTONE) 25 MG tablet Take 0.5 tablets (12.5 mg total) by mouth daily. 45 tablet 1  . torsemide (DEMADEX) 20 MG tablet Take 0.5 tablets (10 mg total) by mouth daily. 45 tablet 3   No current facility-administered medications for this visit.     Past Surgical History:  Procedure Laterality Date  .  COLONOSCOPY  01/04/2012   Procedure: COLONOSCOPY;  Surgeon: Danie Binder, MD;  Location: [facility];  Service: Endoscopy;  Laterality: N/A;  1:30 PM  . HERNIA REPAIR    . LEFT AND RIGHT HEART CATHETERIZATION WITH CORONARY ANGIOGRAM N/A 07/09/2011   Procedure: LEFT AND RIGHT HEART CATHETERIZATION WITH CORONARY ANGIOGRAM;  Surgeon: Peter M Martinique, MD;  Location: [facility] CATH LAB;  Service: Cardiovascular;  Laterality: N/A;     No Known Allergies    Family History  Problem Relation Age of Onset  . Heart attack Father   . Heart failure Father   . Cancer Mother        Multiple myeloma  . Heart disease Sister      Social History [name] reports that he has been smoking cigarettes and  e-cigarettes. He started smoking about 37 years ago. He has been smoking about 0.25 packs per day. He has never used smokeless tobacco. [name] reports previous alcohol use.   Review of Systems CONSTITUTIONAL: No weight loss, fever, chills, weakness or fatigue.  HEENT: Eyes: No visual loss, blurred vision, double vision or yellow sclerae.No hearing loss, sneezing, congestion, runny nose or sore throat.  SKIN: No rash or itching.  CARDIOVASCULAR: per hpi RESPIRATORY: No shortness of breath, cough or sputum.  GASTROINTESTINAL: No anorexia, nausea, vomiting or diarrhea. No abdominal pain or blood.  GENITOURINARY: No burning on urination, no polyuria NEUROLOGICAL: No headache, dizziness, syncope, paralysis, ataxia, numbness or tingling in the extremities. No change in bowel or bladder control.  MUSCULOSKELETAL: No muscle, back pain, joint pain or stiffness.  LYMPHATICS: No enlarged nodes. No history of splenectomy.  PSYCHIATRIC: No history of depression or anxiety.  ENDOCRINOLOGIC: No reports of sweating, cold or heat intolerance. No polyuria or polydipsia.  Marland Kitchen   Physical Examination Today's Vitals   01/04/20 1314  BP: 122/80  Pulse: 61  SpO2: 95%  Weight: 216 lb (98 kg)  Height: 6' (1.829 m)   Body mass index is 29.29 kg/m.  Gen: resting comfortably, no acute distress HEENT: no scleral icterus, pupils equal round and reactive, no palptable cervical adenopathy,  CV: RRR, no m/r/g, no jvd Resp: Clear to auscultation bilaterally GI: abdomen is soft, non-tender, non-distended, normal bowel sounds, no hepatosplenomegaly MSK: extremities are warm, no edema.  Skin: warm, no rash Neuro:  no focal deficits Psych: appropriate affect   Diagnostic Studies  06/2019 echo IMPRESSIONS    1. No LV thrombus noted . Left ventricular ejection fraction, by  estimation, is 25 to 30%. The left ventricle has severely decreased  function. The left ventricle demonstrates global hypokinesis.  The left  ventricular internal cavity size was moderately  dilated. There is mild left ventricular hypertrophy. Left ventricular  diastolic parameters are consistent with Grade II diastolic dysfunction  (pseudonormalization). Elevated left ventricular end-diastolic pressure.  2. Right ventricular systolic function is moderately reduced. The right  ventricular size is mildly enlarged. There is moderately elevated  pulmonary artery systolic pressure.  3. Left atrial size was moderately dilated.  4. Right atrial size was mildly dilated.  5. The mitral valve is normal in structure. No evidence of mitral valve  regurgitation. No evidence of mitral stenosis.  6. The aortic valve is tricuspid. Aortic valve regurgitation is not  visualized. No aortic stenosis is present.  7. The inferior vena cava is dilated in size with >50% respiratory  variability, suggesting right atrial pressure of 8 mmHg.    07/2019 echo IMPRESSIONS    1. Limited  echo no doppler or color flow Compared to 07/03/19 no apical  thrombus noted on this exam either Some trabeculations at apex and  prominent papillary muscles no contrast/definity given EF remains severely  reduced . Left ventricular ejection  fraction, by estimation, is 20 to 25%. The left ventricle has severely  decreased function. The left ventricle demonstrates global hypokinesis.  The left ventricular internal cavity size was severely dilated. There is  moderate left ventricular hypertrophy.  2. Compared to 07/03/19 RV size appears smaller with normal function .  Right ventricular systolic function is normal. The right ventricular size  is normal.  3. The mitral valve was not assessed. No evidence of mitral valve  regurgitation. No evidence of mitral stenosis.  4. The aortic valve is normal in structure. Aortic valve regurgitation is  not visualized. No aortic stenosis is present.  5. The inferior vena cava is normal in size with greater than  50%  respiratory variability, suggesting right atrial pressure of 3 mmHg.    Assessment and Plan  1. Chronic systolic HF - weight gain but no new symptoms and appears euvolemic by exam, appears to be due to high caloric intake, discussed modification - continue current meds. Torsemide recently lowered to 18m daily due to uptrend in Cr, Cr trending back down - with renal dysfunction have not increased ARB or changed to entresto, Low normal HRs, have not increased beta blocker. Will incease aldactone to 226mdaily, check labs 2 weeks - reorder cardiac MRI - pending LVEF and response to medical therapy may need to consider ICD evaluation.    2. Afib -continue amiodarone -check CMET/TSH      JoArnoldo LenisM.D.

## 2020-01-07 ENCOUNTER — Telehealth: Payer: Self-pay | Admitting: Cardiology

## 2020-01-07 ENCOUNTER — Encounter: Payer: Self-pay | Admitting: Cardiology

## 2020-01-07 NOTE — Telephone Encounter (Signed)
Spoke with patient regarding Cardiac MRI appointment scheduled Monday 02/04/20 at 8:00 am at [facility]---arrival time is 7:30 am---1st floor admissions office----will mail information to patient and he voiced his understanding.

## 2020-01-07 NOTE — Addendum Note (Signed)
Addended by: Levonne Hubert on: 01/07/2020 02:21 PM   Modules accepted: Orders

## 2020-01-16 ENCOUNTER — Telehealth: Payer: Self-pay | Admitting: Cardiology

## 2020-01-16 NOTE — Telephone Encounter (Signed)
Patient with diagnosis of afib on Eliquis for anticoagulation.    Procedure: 7 dental extractions Date of procedure: TBD  CHA2DS2-VASc Score = 3  This indicates a 3.2% annual risk of stroke. The patient's score is based upon: CHF History: Yes HTN History: Yes Diabetes History: Yes Stroke History: No Vascular Disease History: No Age Score: 0 Gender Score: 0  LV thrombus noted on 04/2019 echo, same time afib was diagnosed. Resolved on repeat echo 06/2019 and warfarin was changed to Eliquis.  CrCl 72mL/min Platelet count 201K  Patient does not require pre-op antibiotics for dental procedure.  Per office protocol, patient can hold Eliquis for 2 days prior to procedure.

## 2020-01-16 NOTE — Telephone Encounter (Addendum)
   Primary Cardiologist: Dina Rich, MD  Chart reviewed as part of pre-operative protocol coverage. Request received for pharmacy clearance only. [name] is on Eliquis due to atrial fibrillation. Chart reviewed by pharmacy team. Per office protocols he may hold Eliquis 2 days prior to the procedure. No indication for pre-op antibiotics.   Attempted to contact [name] with these recommendations however the phone was picked up, no response when I introduced myself, and then hung up.  I will route this recommendation to the requesting party via Epic fax function and remove from pre-op pool.  Please call with questions.  Alver Sorrow, NP 01/16/2020, 3:15 PM

## 2020-01-16 NOTE — Telephone Encounter (Signed)
Spoke with dental office. Plan is for 7 dental extractions. Will route to pharmacy team for review.   Alver Sorrow, NP

## 2020-01-16 NOTE — Telephone Encounter (Signed)
   [facility] Pre-operative Risk Assessment    HEARTCARE STAFF: - Please ensure there is not already an duplicate clearance open for this procedure. - Under Visit Info/Reason for Call, type in Other and utilize the format Clearance MM/DD/YY or Clearance TBD. Do not use dashes or single digits. - If request is for dental extraction, please clarify the # of teeth to be extracted.  Request for surgical clearance:  1. What type of surgery is being performed? Teeth Extractions   2. When is this surgery scheduled? TBD  3. What type of clearance is required (medical clearance vs. Pharmacy clearance to hold med vs. Both)? Hold Medication   4. Are there any medications that need to be held prior to surgery and how long?blood thinner  5. Practice name and name of physician performing surgery? [facility]  6. What is the office phone number? [phone]   7.   What is the office fax number? [phone]   8.   Anesthesia type (None, local, MAC, general) ? Not indicated    Chanda Busing 01/16/2020, 12:05 PM  _________________________________________________________________   (provider comments below)

## 2020-01-16 NOTE — Telephone Encounter (Signed)
Called dentist. Need clarity on how many teeth are being extracted prior to providing clearance. Left VM requesting call back.   Alver Sorrow, NP

## 2020-02-01 ENCOUNTER — Telehealth (HOSPITAL_COMMUNITY): Payer: Self-pay | Admitting: Emergency Medicine

## 2020-02-01 NOTE — Telephone Encounter (Signed)
Reaching out to patient to offer assistance regarding upcoming cardiac imaging study; pt verbalizes understanding of appt date/time, parking situation and where to check in,and verified current allergies; name and call back number provided for further questions should they arise Thao Bauza RN Navigator Cardiac Imaging [facility] [phone] office [phone] cell   Pt denies claustro, denies implants 

## 2020-02-04 ENCOUNTER — Other Ambulatory Visit: Payer: Self-pay

## 2020-02-04 ENCOUNTER — Ambulatory Visit (HOSPITAL_COMMUNITY)
Admission: RE | Admit: 2020-02-04 | Discharge: 2020-02-04 | Disposition: A | Discharge status: Home / Self Care | Payer: Medicare PPO | Source: Ambulatory Visit | Attending: Cardiology | Admitting: Cardiology

## 2020-02-04 DIAGNOSIS — I5022 Chronic systolic (congestive) heart failure: Secondary | ICD-10-CM | POA: Insufficient documentation

## 2020-02-04 MED ORDER — GADOBUTROL 1 MMOL/ML IV SOLN
10.0000 mL | Freq: Once | INTRAVENOUS | Status: AC | PRN
  Administered 2020-02-04: 10 mL via INTRAVENOUS

## 2020-02-07 ENCOUNTER — Other Ambulatory Visit: Payer: Self-pay | Admitting: Cardiology

## 2020-02-12 ENCOUNTER — Telehealth: Payer: Self-pay | Admitting: *Deleted

## 2020-02-12 ENCOUNTER — Ambulatory Visit: Payer: Self-pay | Admitting: *Deleted

## 2020-02-12 DIAGNOSIS — I5022 Chronic systolic (congestive) heart failure: Secondary | ICD-10-CM

## 2020-02-12 NOTE — Telephone Encounter (Signed)
-----   Message from Arnoldo Lenis, MD sent at 02/12/2020  3:49 PM EST ----- MRI shows some ongoing weakness of the heart pumping function. There is some suggestion of some possible inflammation of the heart muscle, I am going to refer him to a heart failure specialist to look into this further and further treat his heart failure. Can we please refer to Dr Rowland Lathe, I have messaged Dr Marigene Ehlers about the patient as well   Carlyle Dolly MD

## 2020-02-14 ENCOUNTER — Other Ambulatory Visit (HOSPITAL_COMMUNITY): Payer: Self-pay | Admitting: *Deleted

## 2020-02-14 ENCOUNTER — Ambulatory Visit (HOSPITAL_COMMUNITY)
Admission: RE | Admit: 2020-02-14 | Discharge: 2020-02-14 | Disposition: A | Discharge status: Home / Self Care | Payer: Medicare PPO | Source: Ambulatory Visit | Attending: Cardiology | Admitting: Cardiology

## 2020-02-14 ENCOUNTER — Other Ambulatory Visit: Payer: Self-pay

## 2020-02-14 VITALS — BP 102/63 | HR 58 | Wt 216.2 lb

## 2020-02-14 DIAGNOSIS — I480 Paroxysmal atrial fibrillation: Secondary | ICD-10-CM | POA: Insufficient documentation

## 2020-02-14 DIAGNOSIS — I4891 Unspecified atrial fibrillation: Secondary | ICD-10-CM | POA: Diagnosis not present

## 2020-02-14 DIAGNOSIS — Z8249 Family history of ischemic heart disease and other diseases of the circulatory system: Secondary | ICD-10-CM | POA: Diagnosis not present

## 2020-02-14 DIAGNOSIS — I2510 Atherosclerotic heart disease of native coronary artery without angina pectoris: Secondary | ICD-10-CM | POA: Diagnosis not present

## 2020-02-14 DIAGNOSIS — Z7984 Long term (current) use of oral hypoglycemic drugs: Secondary | ICD-10-CM | POA: Diagnosis not present

## 2020-02-14 DIAGNOSIS — N183 Chronic kidney disease, stage 3 unspecified: Secondary | ICD-10-CM | POA: Insufficient documentation

## 2020-02-14 DIAGNOSIS — Z7901 Long term (current) use of anticoagulants: Secondary | ICD-10-CM | POA: Insufficient documentation

## 2020-02-14 DIAGNOSIS — F17210 Nicotine dependence, cigarettes, uncomplicated: Secondary | ICD-10-CM | POA: Insufficient documentation

## 2020-02-14 DIAGNOSIS — I420 Dilated cardiomyopathy: Secondary | ICD-10-CM | POA: Diagnosis not present

## 2020-02-14 DIAGNOSIS — I5022 Chronic systolic (congestive) heart failure: Secondary | ICD-10-CM

## 2020-02-14 DIAGNOSIS — Z86718 Personal history of other venous thrombosis and embolism: Secondary | ICD-10-CM | POA: Insufficient documentation

## 2020-02-14 DIAGNOSIS — Z79899 Other long term (current) drug therapy: Secondary | ICD-10-CM | POA: Diagnosis not present

## 2020-02-14 DIAGNOSIS — E1122 Type 2 diabetes mellitus with diabetic chronic kidney disease: Secondary | ICD-10-CM | POA: Insufficient documentation

## 2020-02-14 DIAGNOSIS — R5383 Other fatigue: Secondary | ICD-10-CM | POA: Diagnosis present

## 2020-02-14 DIAGNOSIS — R0600 Dyspnea, unspecified: Secondary | ICD-10-CM | POA: Diagnosis not present

## 2020-02-14 LAB — CBC
HCT: 45 % (ref 39.0–52.0)
Hemoglobin: 15 g/dL (ref 13.0–17.0)
MCH: 35.5 pg — ABNORMAL HIGH (ref 26.0–34.0)
MCHC: 33.3 g/dL (ref 30.0–36.0)
MCV: 106.6 fL — ABNORMAL HIGH (ref 80.0–100.0)
Platelets: 224 10*3/uL (ref 150–400)
RBC: 4.22 MIL/uL (ref 4.22–5.81)
RDW: 12.8 % (ref 11.5–15.5)
WBC: 7.1 10*3/uL (ref 4.0–10.5)
nRBC: 0 % (ref 0.0–0.2)

## 2020-02-14 LAB — BRAIN NATRIURETIC PEPTIDE: B Natriuretic Peptide: 116.8 pg/mL — ABNORMAL HIGH (ref 0.0–100.0)

## 2020-02-14 LAB — COMPREHENSIVE METABOLIC PANEL
ALT: 18 U/L (ref 0–44)
AST: 17 U/L (ref 15–41)
Albumin: 3.9 g/dL (ref 3.5–5.0)
Alkaline Phosphatase: 79 U/L (ref 38–126)
Anion gap: 12 (ref 5–15)
BUN: 28 mg/dL — ABNORMAL HIGH (ref 8–23)
CO2: 25 mmol/L (ref 22–32)
Calcium: 9.2 mg/dL (ref 8.9–10.3)
Chloride: 101 mmol/L (ref 98–111)
Creatinine, Ser: 2 mg/dL — ABNORMAL HIGH (ref 0.61–1.24)
GFR, Estimated: 37 mL/min — ABNORMAL LOW (ref >60)
Glucose, Bld: 261 mg/dL — ABNORMAL HIGH (ref 70–99)
Potassium: 4.3 mmol/L (ref 3.5–5.1)
Sodium: 138 mmol/L (ref 135–145)
Total Bilirubin: 0.6 mg/dL (ref 0.3–1.2)
Total Protein: 7.8 g/dL (ref 6.5–8.1)

## 2020-02-14 LAB — TSH: TSH: 169.711 u[IU]/mL — ABNORMAL HIGH (ref 0.350–4.500)

## 2020-02-14 MED ORDER — TORSEMIDE 20 MG PO TABS: 10.0000 mg | ORAL_TABLET | ORAL | 3 refills | Status: DC

## 2020-02-14 MED ORDER — ENTRESTO 24-26 MG PO TABS: 1.0000 | ORAL_TABLET | Freq: Two times a day (BID) | ORAL | 3 refills | Status: DC

## 2020-02-14 NOTE — Patient Instructions (Signed)
Stop Losartan  Start Entresto 24/26 mg Twice daily   Decrease Torsemide to 10 mg every other day   Labs done today, we will call you for abnormal results  Heart Catheterization on Monday 02/25/20, see instructions below  Non-Cardiac CT scanning, (CAT scanning), is a noninvasive, special x-ray that produces cross-sectional images of the body using x-rays and a computer. CT scans help physicians diagnose and treat medical conditions. For some CT exams, a contrast material is used to enhance visibility in the area of the body being studied. CT scans provide greater clarity and reveal more details than regular x-ray exams. ONCE APPROVED BY YOUR INSURANCE WE WILL CALL YOU TO SCHEDULE  Your provider has recommended you have a Cardiac PET Scan at [facility]. We will get this approved with your insurance company and get it scheduled for you. We will call you with the date and time and instructions. [facility] will call you to review this information the day before the test.  Your physician recommends that you schedule a follow-up appointment in: 3 weeks  If you have any questions or concerns before your next appointment please send [place] a message through [place] or call our office at [phone].    TO LEAVE A MESSAGE FOR THE NURSE SELECT OPTION 2, PLEASE LEAVE A MESSAGE INCLUDING: . YOUR NAME . DATE OF BIRTH . CALL BACK NUMBER . REASON FOR CALL**this is important as we prioritize the call backs  [place] AS LONG AS YOU CALL BEFORE 4:00 PM  At the [facility], you and your health needs are our priority. As part of our continuing mission to provide you with exceptional heart care, we have created designated Provider Care Teams. These Care Teams include your primary Cardiologist (physician) and Advanced Practice Providers (APPs- Physician Assistants and Nurse Practitioners) who all work together to provide you with the care you need, when you need it.   You may  see any of the following providers on your designated Care Team at your next follow up: Marland Kitchen Dr Glori Bickers . Dr Loralie Champagne . Darrick Grinder, NP . Lyda Jester, [place] . Audry Riles, PharmD   Please be sure to bring in all your medications bottles to every appointment.     CATHETERIZATION INSTRUCTIONS:  You are scheduled for a Cardiac Catheterization on Monday, February 7 with Dr. Loralie Champagne  1. Please arrive at the [facility] (Main Entrance A) at [facility]: [address] [place], [place] [address] at 7:00 AM (This time is two hours before your procedure to ensure your preparation). Free valet parking service is available.   Special note: Every effort is made to have your procedure done on time. Please understand that emergencies sometimes delay scheduled procedures.  2. Diet: Do not eat solid foods after midnight.  The patient may have clear liquids until 5am upon the day of the procedure.  3. COVID TEST: Saturday 02/23/20 at 9:30 AM, this is a drive thru testing site located at:   [place]  4. Medication instructions in preparation for your procedure:   Sunday 2/6 DO NOT TAKE Eliquis  Monday 2/7 DO NOT TAKE Elqiuis, Metformin, Torsemide, or Spironolactone  Tuesday 2/8 DO NOT TAKE DO NOT TAKE Metformin  Wednesday 2/9 DO NOT TAKE Metformin   On the morning of your procedure, take any morning medicines NOT listed above.  You may use sips of water.  5.  Plan for one night stay--bring personal belongings. 6. Bring a current list of your medications and current insurance cards. 7. You MUST have a responsible person to drive you home. 8. Someone MUST be with you the first 24 hours after you arrive home or your discharge will be delayed. 9. Please wear clothes that are easy to get on and off and wear slip-on shoes.  Thank you for allowing [place] to care for you!   -- [place] [facility]

## 2020-02-14 NOTE — Progress Notes (Signed)
PCP: Lucia Gaskins, MD Cardiology: Dr. Harl Bowie HF Cardiology: Dr. Aundra Dubin  65 y.o. with long history of cardiomyopathy, paroxysmal atrial fibrillation, and CKD stage 3 was referred by Dr. Harl Bowie for evaluation of CHF. 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 Patient was diagnosed with CHF in 2013.  At that time, EF was 15%.  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  Since that time, repeat echoes have shown EF < 35%.  Most recent study was a cardiac MRI in 1/22.  This showed LV EF 26%.  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  Patient has no family history of sarcoidosis.  His father had CHF, he is not sure of etiology.  Patient was found to have an LV thrombus.  Initially on warfarin, now on Eliquis.  He has a history of paroxysmal AF, currently in NSR on amiodarone.  He is still smoking 1-2 cigarettes/day.  He drinks a couple beers/week.   Patient fatigues easily.  He can walk a couple of blocks before getting winded.  Mild dyspnea with a flight of stairs.  No chest pain.  No lightheadedness/syncope.  No palpitations.  No orthopnea/PND.  No rash or joint pain.    ECG (personally reviewed): sinus brady 59, RBBB, LAFB, old inferior MI  Labs (12/21): K 4.7, creatinine 1.57  PMH: 1. Hyperlipidemia 2. Atrial fibrillation: Paroxysmal, first diagnosed in 4/21.  3. CKD stage 3 4. H/o NSVT 5. Type 2 diabetes 6. Chronic systolic CHF: Echo in 6256 with EF 15%.  LHC in 2013 with nonobstructive CAD.  - Echo (2015): EF 30-35%.  - Echo (4/21): EF 25-30%, moderate RV dysfunction, LV thrombus.  - Cardiac MRI (1/22): LV EF 26% with mild LVE, akinesis of the basal to mid anteroseptal wall, RV EF 33%, delayed enhancement showed transmural LGE basal anteroseptal wall, mid-myocardial LGE in the septum, subendocardial to mid myocardial LGE in the basal anterior, basal inferoseptal, and basal inferior  walls.  Most consistent with cardiac sarcoidosis.  7. H/o LV thrombus  Social History   Socioeconomic History  . Marital status: Married    Spouse name: Not on file  . Number of children: 0  . Years of education: Not on file  . Highest education level: Not on file  Occupational History  . Occupation: disabled  Tobacco Use  . Smoking status: Current Some Day Smoker    Packs/day: 0.25    Types: Cigarettes, E-cigarettes    Start date: 10/13/1982    Last attempt to quit: 09/19/2011    Years since quitting: 8.4  . Smokeless tobacco: Never Used  . Tobacco comment: electronic cig for 4 months   Vaping Use  . Vaping Use: Never used  Substance and Sexual Activity  . Alcohol use: Not Currently    Comment: weekends  . Drug use: No  . Sexual activity: Not on file  Other Topics Concern  . Not on file  Social History Narrative   Patient is right handed.   Patient seldom drinks caffeine.   Social Determinants of Health   Financial Resource Strain: Not on file  Food Insecurity: Not on file  Transportation Needs: Not on file  Physical Activity: Not on file  Stress: Not on file  Social Connections: Not on file  Intimate Partner Violence: Not on file   Family History  Problem Relation Age of Onset  . Heart attack Father   . Heart failure Father   .  Cancer Mother        Multiple myeloma  . Heart disease Sister    ROS: All systems reviewed and negative except as per HPI.   Current Outpatient Medications  Medication Sig Dispense Refill  . amiodarone (PACERONE) 200 MG tablet Take 1 tablet (200 mg total) by mouth daily. 90 tablet 3  . apixaban (ELIQUIS) 5 MG TABS tablet Take 5 mg by mouth 2 (two) times daily.    . Blood Glucose Calibration (TRUE METRIX LEVEL 1) Low SOLN     . Blood Glucose Monitoring Suppl (TRUE METRIX METER) w/Device KIT     . gabapentin (NEURONTIN) 100 MG capsule Take 200 mg by mouth 3 (three) times daily.    . magnesium 30 MG tablet Take 30 mg by mouth 2 (two)  times daily.    . metFORMIN (GLUCOPHAGE) 500 MG tablet Take 500 mg by mouth 2 (two) times daily.    . metoprolol succinate (TOPROL-XL) 25 MG 24 hr tablet Take 1.5 tablets (37.5 mg total) by mouth daily. 135 tablet 3  . Oxycodone HCl 10 MG TABS Take 10 mg by mouth 4 (four) times daily as needed (for pain).     . potassium chloride SA (KLOR-CON) 20 MEQ tablet Take 2 tablets (40 mEq total) by mouth daily. 180 tablet 3  . sacubitril-valsartan (ENTRESTO) 24-26 MG Take 1 tablet by mouth 2 (two) times daily. 60 tablet 3  . simvastatin (ZOCOR) 40 MG tablet Take 1 tablet (40 mg total) by mouth at bedtime. 90 tablet 1  . spironolactone (ALDACTONE) 25 MG tablet Take 1 tablet (25 mg total) by mouth daily. 90 tablet 3  . TRUE METRIX BLOOD GLUCOSE TEST test strip     . TRUEplus Lancets 33G MISC     . torsemide (DEMADEX) 20 MG tablet Take 0.5 tablets (10 mg total) by mouth every other day. 45 tablet 3   No current facility-administered medications for this encounter.   BP 102/63   Pulse (!) 58   Wt 98.1 kg (216 lb 3.2 oz)   SpO2 98%   BMI 29.32 kg/m  General: NAD Neck: No JVD, no thyromegaly or thyroid nodule.  Lungs: Clear to auscultation bilaterally with normal respiratory effort. CV: Nondisplaced PMI.  Heart regular S1/S2, no S3/S4, no murmur.  No peripheral edema.  No carotid bruit.  Normal pedal pulses.  Abdomen: Soft, nontender, no hepatosplenomegaly, no distention.  Skin: Intact without lesions or rashes.  Neurologic: Alert and oriented x 3.  Psych: Normal affect. Extremities: No clubbing or cyanosis.  HEENT: Normal.   Assessment/Plan: 1. Chronic systolic CHF: Etiology of cardiomyopathy is uncertain.  EF has been known to be low since 2013, cardiac cath in 2013 showed nonobstructive CAD.  Most recent study was a cardiac MRI in 1/22 showing LV EF 26% with mild LVE, akinesis of the basal to mid anteroseptal wall, RV EF 33%, delayed enhancement showed transmural LGE basal anteroseptal wall,  mid-myocardial LGE in the septum, subendocardial to mid myocardial LGE in the basal anterior, basal inferoseptal, and basal inferior walls.  Most consistent with cardiac sarcoidosis though cannot fully rule out CAD as cause.  No family history of sarcoidosis, no known lung disease, rashes, or joint pain.  NYHA class II-III symptoms, he is not volume overloaded on exam.  - Stop losartan.  - Start Entresto 24/26 bid. BMET/BNP today and repeat in 10 days.  - Continue spironolactone 25 mg daily.  - Continue Toprol XL 37.5 mg daily.  - We discussed  RHC/LHC to assess filling pressures and cardiac output and definitively rule out significant CAD as a contributor to his scar pattern.  I described risks/benefits and he agrees to procedure. Hold Eliquis the day before and day of cath.  - I will send ACE level and also get high resolution CT chest to look for signs of pulmonary sarcoidosis.  - I will try to arrange cardiac PET at [facility] to assess for cardiac sarcoidosis.  - We discussed ICD.  He is not CRT candidate with RBBB.  He wants to do cath and sarcoidosis workup before contemplating ICD.  2. Atrial fibrillation: Paroxysmal.  He is in NSR on amiodarone.  - Continue apixaban.  - Continue amiodarone 200 daily.  Check LFTs/TSH today.  He will need a regular eye exam given amiodarone use.  3. LV thrombus: He has been on Eliquis. No thrombus on 1/22 cMRI.  4. CKD: Stage 3.  Follow creatinine closely with med titration and cath.  BMET today.   Loralie Champagne 02/15/2020

## 2020-02-14 NOTE — H&P (View-Only) (Signed)
PCP: Lucia Gaskins, MD Cardiology: Dr. Harl Bowie HF Cardiology: Dr. Aundra Dubin  65 y.o. with long history of cardiomyopathy, paroxysmal atrial fibrillation, and CKD stage 3 was referred by Dr. Harl Bowie for evaluation of CHF. 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  At that time, EF was 15%.  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  Since that time, repeat echoes have shown EF < 35%.  Most recent study was a cardiac MRI in 1/22.  This showed LV EF 26%.  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  Patient has no family history of sarcoidosis.  His father had CHF, he is not sure of etiology.  Patient was found to have an LV thrombus.  Initially on warfarin, now on Eliquis.  He has a history of paroxysmal AF, currently in NSR on amiodarone.  He is still smoking 1-2 cigarettes/day.  He drinks a couple beers/week.   Patient fatigues easily.  He can walk a couple of blocks before getting winded.  Mild dyspnea with a flight of stairs.  No chest pain.  No lightheadedness/syncope.  No palpitations.  No orthopnea/PND.  No rash or joint pain.    ECG (personally reviewed): sinus brady 59, RBBB, LAFB, old inferior MI  Labs (12/21): K 4.7, creatinine 1.57  PMH: 1. Hyperlipidemia 2. Atrial fibrillation: Paroxysmal, first diagnosed in 4/21.  3. CKD stage 3 4. H/o NSVT 5. Type 2 diabetes 6. Chronic systolic CHF: Echo in 2542 with EF 15%.  LHC in 2013 with nonobstructive CAD.  - Echo (2015): EF 30-35%.  - Echo (4/21): EF 25-30%, moderate RV dysfunction, LV thrombus.  - Cardiac MRI (1/22): LV EF 26% with mild LVE, akinesis of the basal to mid anteroseptal wall, RV EF 33%, delayed enhancement showed transmural LGE basal anteroseptal wall, mid-myocardial LGE in the septum, subendocardial to mid myocardial LGE in the basal anterior, basal inferoseptal, and basal inferior  walls.  Most consistent with cardiac sarcoidosis.  7. H/o LV thrombus  Social History   Socioeconomic History  . Marital status: Married    Spouse name: Not on file  . Number of children: 0  . Years of education: Not on file  . Highest education level: Not on file  Occupational History  . Occupation: disabled  Tobacco Use  . Smoking status: Current Some Day Smoker    Packs/day: 0.25    Types: Cigarettes, E-cigarettes    Start date: 10/13/1982    Last attempt to quit: 09/19/2011    Years since quitting: 8.4  . Smokeless tobacco: Never Used  . Tobacco comment: electronic cig for 4 months   Vaping Use  . Vaping Use: Never used  Substance and Sexual Activity  . Alcohol use: Not Currently    Comment: weekends  . Drug use: No  . Sexual activity: Not on file  Other Topics Concern  . Not on file  Social History Narrative   Patient is right handed.   Patient seldom drinks caffeine.   Social Determinants of Health   Financial Resource Strain: Not on file  Food Insecurity: Not on file  Transportation Needs: Not on file  Physical Activity: Not on file  Stress: Not on file  Social Connections: Not on file  Intimate Partner Violence: Not on file   Family History  Problem Relation Age of Onset  . Heart attack Father   . Heart failure Father   .  Cancer Mother        Multiple myeloma  . Heart disease Sister    ROS: All systems reviewed and negative except as per HPI.   Current Outpatient Medications  Medication Sig Dispense Refill  . amiodarone (PACERONE) 200 MG tablet Take 1 tablet (200 mg total) by mouth daily. 90 tablet 3  . apixaban (ELIQUIS) 5 MG TABS tablet Take 5 mg by mouth 2 (two) times daily.    . Blood Glucose Calibration (TRUE METRIX LEVEL 1) Low SOLN     . Blood Glucose Monitoring Suppl (TRUE METRIX METER) w/Device KIT     . gabapentin (NEURONTIN) 100 MG capsule Take 200 mg by mouth 3 (three) times daily.    . magnesium 30 MG tablet Take 30 mg by mouth 2 (two)  times daily.    . metFORMIN (GLUCOPHAGE) 500 MG tablet Take 500 mg by mouth 2 (two) times daily.    . metoprolol succinate (TOPROL-XL) 25 MG 24 hr tablet Take 1.5 tablets (37.5 mg total) by mouth daily. 135 tablet 3  . Oxycodone HCl 10 MG TABS Take 10 mg by mouth 4 (four) times daily as needed (for pain).     . potassium chloride SA (KLOR-CON) 20 MEQ tablet Take 2 tablets (40 mEq total) by mouth daily. 180 tablet 3  . sacubitril-valsartan (ENTRESTO) 24-26 MG Take 1 tablet by mouth 2 (two) times daily. 60 tablet 3  . simvastatin (ZOCOR) 40 MG tablet Take 1 tablet (40 mg total) by mouth at bedtime. 90 tablet 1  . spironolactone (ALDACTONE) 25 MG tablet Take 1 tablet (25 mg total) by mouth daily. 90 tablet 3  . TRUE METRIX BLOOD GLUCOSE TEST test strip     . TRUEplus Lancets 33G MISC     . torsemide (DEMADEX) 20 MG tablet Take 0.5 tablets (10 mg total) by mouth every other day. 45 tablet 3   No current facility-administered medications for this encounter.   BP 102/63   Pulse (!) 58   Wt 98.1 kg (216 lb 3.2 oz)   SpO2 98%   BMI 29.32 kg/m  General: NAD Neck: No JVD, no thyromegaly or thyroid nodule.  Lungs: Clear to auscultation bilaterally with normal respiratory effort. CV: Nondisplaced PMI.  Heart regular S1/S2, no S3/S4, no murmur.  No peripheral edema.  No carotid bruit.  Normal pedal pulses.  Abdomen: Soft, nontender, no hepatosplenomegaly, no distention.  Skin: Intact without lesions or rashes.  Neurologic: Alert and oriented x 3.  Psych: Normal affect. Extremities: No clubbing or cyanosis.  HEENT: Normal.   Assessment/Plan: 1. Chronic systolic CHF: Etiology of cardiomyopathy is uncertain.  EF has been known to be low since 2013, cardiac cath in 2013 showed nonobstructive CAD.  Most recent study was a cardiac MRI in 1/22 showing LV EF 26% with mild LVE, akinesis of the basal to mid anteroseptal wall, RV EF 33%, delayed enhancement showed transmural LGE basal anteroseptal wall,  mid-myocardial LGE in the septum, subendocardial to mid myocardial LGE in the basal anterior, basal inferoseptal, and basal inferior walls.  Most consistent with cardiac sarcoidosis though cannot fully rule out CAD as cause.  No family history of sarcoidosis, no known lung disease, rashes, or joint pain.  NYHA class II-III symptoms, he is not volume overloaded on exam.  - Stop losartan.  - Start Entresto 24/26 bid. BMET/BNP today and repeat in 10 days.  - Continue spironolactone 25 mg daily.  - Continue Toprol XL 37.5 mg daily.  - We discussed  RHC/LHC to assess filling pressures and cardiac output and definitively rule out significant CAD as a contributor to his scar pattern.  I described risks/benefits and he agrees to procedure. Hold Eliquis the day before and day of cath.  - I will send ACE level and also get high resolution CT chest to look for signs of pulmonary sarcoidosis.  - I will try to arrange cardiac PET at [facility] to assess for cardiac sarcoidosis.  - We discussed ICD.  He is not CRT candidate with RBBB.  He wants to do cath and sarcoidosis workup before contemplating ICD.  2. Atrial fibrillation: Paroxysmal.  He is in NSR on amiodarone.  - Continue apixaban.  - Continue amiodarone 200 daily.  Check LFTs/TSH today.  He will need a regular eye exam given amiodarone use.  3. LV thrombus: He has been on Eliquis. No thrombus on 1/22 cMRI.  4. CKD: Stage 3.  Follow creatinine closely with med titration and cath.  BMET today.   Loralie Champagne 02/15/2020

## 2020-02-14 NOTE — Progress Notes (Incomplete)
PCP: Lucia Gaskins, MD Cardiology: Dr. Harl Bowie HF Cardiology: Dr. Aundra Dubin  65 y.o. with long history of cardiomyopathy, paroxysmal atrial fibrillation, and CKD stage 3 was referred by Dr. Harl Bowie for evaluation of CHF.  Patient was diagnosed with CHF in 2013.  At that time, EF was 15%.  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  Since that time, repeat echoes have shown EF < 35%.  Most recent study was a cardiac MRI in 1/22.  This showed LV EF 26%.  There was an extensive delayed enhancement pattern that was more suggestive of infiltr  ECG (personally reviewed): sinus brady 59, RBBB, LAFB, old inferior MI  Labs (12/21): K 4.7, creatinine 1.57  PMH: 1. Hyperlipidemia 2. Atrial fibrillation: Paroxysmal, first diagnosed in 4/21.  3. CKD stage 3 4. H/o NSVT 5. Type 2 diabetes 6. Chronic systolic CHF: Echo in 8413 with EF 15%.  LHC in 2013 with nonobstructive CAD.  - Echo (2015): EF 30-35%.  - Echo (4/21): EF 25-30%, moderate RV dysfunction, LV thrombus.  - Cardiac MRI (1/22): LV EF 26% with mild LVE, akinesis of the basal to mid anteroseptal wall, RV EF 33%, delayed enhancement showed transmural LGE basal anteroseptal wall, mid-myocardial LGE in the septum, subendocardial to mid myocardial LGE in the basal anterior, basal inferoseptal, and basal inferior walls.  Most consistent with cardiac sarcoidosis.  7. H/o LV thrombus  Social History   Socioeconomic History  . Marital status: Married    Spouse name: Not on file  . Number of children: 0  . Years of education: Not on file  . Highest education level: Not on file  Occupational History  . Occupation: disabled  Tobacco Use  . Smoking status: Current Some Day Smoker    Packs/day: 0.25    Types: Cigarettes, E-cigarettes    Start date: 10/13/1982    Last attempt to quit: 09/19/2011    Years since quitting: 8.4  . Smokeless tobacco: Never Used  . Tobacco comment: electronic cig for 4 months   Vaping Use  . Vaping Use: Never used   Substance and Sexual Activity  . Alcohol use: Not Currently    Comment: weekends  . Drug use: No  . Sexual activity: Not on file  Other Topics Concern  . Not on file  Social History Narrative   Patient is right handed.   Patient seldom drinks caffeine.   Social Determinants of Health   Financial Resource Strain: Not on file  Food Insecurity: Not on file  Transportation Needs: Not on file  Physical Activity: Not on file  Stress: Not on file  Social Connections: Not on file  Intimate Partner Violence: Not on file   Family History  Problem Relation Age of Onset  . Heart attack Father   . Heart failure Father   . Cancer Mother        Multiple myeloma  . Heart disease Sister    ROS: All systems reviewed and negative except as per HPI.   Current Outpatient Medications  Medication Sig Dispense Refill  . amiodarone (PACERONE) 200 MG tablet Take 1 tablet (200 mg total) by mouth daily. 90 tablet 3  . apixaban (ELIQUIS) 5 MG TABS tablet Take 5 mg by mouth 2 (two) times daily.    . Blood Glucose Calibration (TRUE METRIX LEVEL 1) Low SOLN     . Blood Glucose Monitoring Suppl (TRUE METRIX METER) w/Device KIT     . gabapentin (NEURONTIN) 100 MG capsule Take 200 mg by mouth 3 (three)  times daily.    . magnesium 30 MG tablet Take 30 mg by mouth 2 (two) times daily.    . metFORMIN (GLUCOPHAGE) 500 MG tablet Take 500 mg by mouth 2 (two) times daily.    . metoprolol succinate (TOPROL-XL) 25 MG 24 hr tablet Take 1.5 tablets (37.5 mg total) by mouth daily. 135 tablet 3  . Oxycodone HCl 10 MG TABS Take 10 mg by mouth 4 (four) times daily as needed (for pain).     . potassium chloride SA (KLOR-CON) 20 MEQ tablet Take 2 tablets (40 mEq total) by mouth daily. 180 tablet 3  . sacubitril-valsartan (ENTRESTO) 24-26 MG Take 1 tablet by mouth 2 (two) times daily. 60 tablet 3  . simvastatin (ZOCOR) 40 MG tablet Take 1 tablet (40 mg total) by mouth at bedtime. 90 tablet 1  . spironolactone (ALDACTONE)  25 MG tablet Take 1 tablet (25 mg total) by mouth daily. 90 tablet 3  . TRUE METRIX BLOOD GLUCOSE TEST test strip     . TRUEplus Lancets 33G MISC     . torsemide (DEMADEX) 20 MG tablet Take 0.5 tablets (10 mg total) by mouth every other day. 45 tablet 3   No current facility-administered medications for this encounter.   BP 102/63   Pulse (!) 58   Wt 98.1 kg (216 lb 3.2 oz)   SpO2 98%   BMI 29.32 kg/m  General: NAD Neck: No JVD, no thyromegaly or thyroid nodule.  Lungs: Clear to auscultation bilaterally with normal respiratory effort. CV: Nondisplaced PMI.  Heart regular S1/S2, no S3/S4, no murmur.  No peripheral edema.  No carotid bruit.  Normal pedal pulses.  Abdomen: Soft, nontender, no hepatosplenomegaly, no distention.  Skin: Intact without lesions or rashes.  Neurologic: Alert and oriented x 3.  Psych: Normal affect. Extremities: No clubbing or cyanosis.  HEENT: Normal.

## 2020-02-14 NOTE — Addendum Note (Signed)
Addended by: Scarlette Calico on: 02/14/2020 03:19 PM   Modules accepted: Orders

## 2020-02-15 ENCOUNTER — Telehealth (HOSPITAL_COMMUNITY): Payer: Self-pay

## 2020-02-15 DIAGNOSIS — N1831 Chronic kidney disease, stage 3a: Secondary | ICD-10-CM

## 2020-02-15 DIAGNOSIS — I5022 Chronic systolic (congestive) heart failure: Secondary | ICD-10-CM

## 2020-02-15 NOTE — Telephone Encounter (Signed)
Patient advised and verbalized understanding. Patient will have labs drawn at [place],labs entered and faxed to the [facility] in [place] @ [phone]

## 2020-02-15 NOTE — Telephone Encounter (Signed)
-----   Message from Larey Dresser, MD sent at 02/15/2020 12:25 PM EST ----- Stop torsemide for now since we started Entresto.  Repeat BMET on Monday or Tuesday (can do in [place]).  Also resend TSH (is the lab from 1/27 accurate?) and send free T3 and free T4 Monday or Tuesday.

## 2020-02-18 ENCOUNTER — Telehealth: Payer: Self-pay | Admitting: Student

## 2020-02-18 NOTE — Telephone Encounter (Signed)
Reached out to patient and talked about the option of exploring the Eliquis Patient Assistance Program. He will be coming into the office to pick up a application.

## 2020-02-18 NOTE — Telephone Encounter (Signed)
Patient states he can not afford Eliquis 5mg .  Out of pocket is $294. Would like to try something cheaper.

## 2020-02-19 ENCOUNTER — Other Ambulatory Visit (HOSPITAL_COMMUNITY)
Admission: RE | Admit: 2020-02-19 | Discharge: 2020-02-19 | Disposition: A | Discharge status: Home / Self Care | Payer: Medicare PPO | Source: Ambulatory Visit | Attending: Cardiology | Admitting: Cardiology

## 2020-02-19 DIAGNOSIS — N1831 Chronic kidney disease, stage 3a: Secondary | ICD-10-CM | POA: Insufficient documentation

## 2020-02-19 DIAGNOSIS — I5022 Chronic systolic (congestive) heart failure: Secondary | ICD-10-CM | POA: Insufficient documentation

## 2020-02-19 LAB — BASIC METABOLIC PANEL
Anion gap: 6 (ref 5–15)
BUN: 24 mg/dL — ABNORMAL HIGH (ref 8–23)
CO2: 19 mmol/L — ABNORMAL LOW (ref 22–32)
Calcium: 8.9 mg/dL (ref 8.9–10.3)
Chloride: 110 mmol/L (ref 98–111)
Creatinine, Ser: 1.59 mg/dL — ABNORMAL HIGH (ref 0.61–1.24)
GFR, Estimated: 48 mL/min — ABNORMAL LOW (ref >60)
Glucose, Bld: 167 mg/dL — ABNORMAL HIGH (ref 70–99)
Potassium: 4.4 mmol/L (ref 3.5–5.1)
Sodium: 135 mmol/L (ref 135–145)

## 2020-02-19 LAB — T4, FREE: Free T4: 0.32 ng/dL — ABNORMAL LOW (ref 0.61–1.12)

## 2020-02-19 LAB — TSH: TSH: 146.543 u[IU]/mL — ABNORMAL HIGH (ref 0.350–4.500)

## 2020-02-20 LAB — ANGIOTENSIN CONVERTING ENZYME: Angiotensin-Converting Enzyme: 23 U/L (ref 14–82)

## 2020-02-20 LAB — T3, FREE: T3, Free: 1.5 pg/mL — ABNORMAL LOW (ref 2.0–4.4)

## 2020-02-21 ENCOUNTER — Telehealth (HOSPITAL_COMMUNITY): Payer: Self-pay

## 2020-02-21 DIAGNOSIS — I5022 Chronic systolic (congestive) heart failure: Secondary | ICD-10-CM

## 2020-02-21 MED ORDER — LEVOTHYROXINE SODIUM 50 MCG PO TABS: 50.0000 ug | ORAL_TABLET | Freq: Every day | ORAL | 3 refills | Status: DC

## 2020-02-21 NOTE — Telephone Encounter (Signed)
Patient advised and verbalized understanding. Lab appt scheduled, lab order entered, new Rx sent into patients pharmacy. Will fax pcp a copy of results.   Meds ordered this encounter  Medications  . levothyroxine (SYNTHROID) 50 MCG tablet    Sig: Take 1 tablet (50 mcg total) by mouth daily before breakfast.    Dispense:  90 tablet    Refill:  3    Orders Placed This Encounter  Procedures  . TSH    Standing Status:   Future    Standing Expiration Date:   02/20/2021    Order Specific Question:   Release to patient    Answer:   Immediate  . T4, free    Standing Status:   Future    Standing Expiration Date:   02/20/2021    Order Specific Question:   Release to patient    Answer:   Immediate  . T3, free    Standing Status:   Future    Standing Expiration Date:   02/20/2021    Order Specific Question:   Release to patient    Answer:   Immediate

## 2020-02-21 NOTE — Telephone Encounter (Signed)
-----   Message from Larey Dresser, MD sent at 02/19/2020  3:57 PM EST ----- Creatinine better.  TSH markedly elevated, this is a true reading.  Will need free T3 and free T4 sent.  Start Levoxyl 50 mcg daily.  Please forward this lab to his PCP to manage hypothyroidism going forwards.  Will need repeat TSH in 1 month after starting Levoxyl.

## 2020-02-23 ENCOUNTER — Other Ambulatory Visit (HOSPITAL_COMMUNITY)
Admission: RE | Admit: 2020-02-23 | Discharge: 2020-02-23 | Disposition: A | Discharge status: Home / Self Care | Payer: Medicare PPO | Source: Ambulatory Visit | Attending: Cardiology | Admitting: Cardiology

## 2020-02-23 DIAGNOSIS — Z20822 Contact with and (suspected) exposure to covid-19: Secondary | ICD-10-CM | POA: Diagnosis not present

## 2020-02-23 DIAGNOSIS — Z01812 Encounter for preprocedural laboratory examination: Secondary | ICD-10-CM | POA: Insufficient documentation

## 2020-02-23 LAB — SARS CORONAVIRUS 2 (TAT 6-24 HRS): SARS Coronavirus 2: NEGATIVE

## 2020-02-25 ENCOUNTER — Encounter (HOSPITAL_COMMUNITY): Payer: Self-pay | Admitting: Cardiology

## 2020-02-25 ENCOUNTER — Ambulatory Visit (HOSPITAL_COMMUNITY)
Admission: RE | Disposition: A | Discharge status: Home / Self Care | Payer: Self-pay | Source: Home / Self Care | Attending: Cardiology

## 2020-02-25 ENCOUNTER — Encounter (HOSPITAL_COMMUNITY): Payer: Self-pay | Admitting: *Deleted

## 2020-02-25 ENCOUNTER — Ambulatory Visit (HOSPITAL_COMMUNITY)
Admission: RE | Admit: 2020-02-25 | Discharge: 2020-02-25 | Disposition: A | Discharge status: Home / Self Care | Payer: Medicare PPO | Attending: Cardiology | Admitting: Cardiology

## 2020-02-25 DIAGNOSIS — F17210 Nicotine dependence, cigarettes, uncomplicated: Secondary | ICD-10-CM | POA: Diagnosis not present

## 2020-02-25 DIAGNOSIS — Z7984 Long term (current) use of oral hypoglycemic drugs: Secondary | ICD-10-CM | POA: Insufficient documentation

## 2020-02-25 DIAGNOSIS — I429 Cardiomyopathy, unspecified: Secondary | ICD-10-CM | POA: Insufficient documentation

## 2020-02-25 DIAGNOSIS — Z8249 Family history of ischemic heart disease and other diseases of the circulatory system: Secondary | ICD-10-CM | POA: Diagnosis not present

## 2020-02-25 DIAGNOSIS — E785 Hyperlipidemia, unspecified: Secondary | ICD-10-CM | POA: Insufficient documentation

## 2020-02-25 DIAGNOSIS — N183 Chronic kidney disease, stage 3 unspecified: Secondary | ICD-10-CM | POA: Insufficient documentation

## 2020-02-25 DIAGNOSIS — Z79899 Other long term (current) drug therapy: Secondary | ICD-10-CM | POA: Diagnosis not present

## 2020-02-25 DIAGNOSIS — I480 Paroxysmal atrial fibrillation: Secondary | ICD-10-CM | POA: Insufficient documentation

## 2020-02-25 DIAGNOSIS — E1122 Type 2 diabetes mellitus with diabetic chronic kidney disease: Secondary | ICD-10-CM | POA: Insufficient documentation

## 2020-02-25 DIAGNOSIS — Z7901 Long term (current) use of anticoagulants: Secondary | ICD-10-CM | POA: Diagnosis not present

## 2020-02-25 DIAGNOSIS — I5022 Chronic systolic (congestive) heart failure: Secondary | ICD-10-CM

## 2020-02-25 DIAGNOSIS — I509 Heart failure, unspecified: Secondary | ICD-10-CM | POA: Diagnosis not present

## 2020-02-25 HISTORY — PX: RIGHT/LEFT HEART CATH AND CORONARY ANGIOGRAPHY: CATH118266

## 2020-02-25 LAB — POCT I-STAT EG7
Acid-base deficit: 2 mmol/L (ref 0.0–2.0)
Acid-base deficit: 2 mmol/L (ref 0.0–2.0)
Bicarbonate: 24.2 mmol/L (ref 20.0–28.0)
Bicarbonate: 23.7 mmol/L (ref 20.0–28.0)
Calcium, Ion: 1.2 mmol/L (ref 1.15–1.40)
Calcium, Ion: 1.15 mmol/L (ref 1.15–1.40)
HCT: 39 % (ref 39.0–52.0)
HCT: 39 % (ref 39.0–52.0)
Hemoglobin: 13.3 g/dL (ref 13.0–17.0)
Hemoglobin: 13.3 g/dL (ref 13.0–17.0)
O2 Saturation: 66 %
O2 Saturation: 68 %
Potassium: 4.8 mmol/L (ref 3.5–5.1)
Potassium: 4.7 mmol/L (ref 3.5–5.1)
Sodium: 136 mmol/L (ref 135–145)
Sodium: 136 mmol/L (ref 135–145)
TCO2: 26 mmol/L (ref 22–32)
TCO2: 25 mmol/L (ref 22–32)
pCO2, Ven: 45 mmHg (ref 44.0–60.0)
pCO2, Ven: 43.9 mmHg — ABNORMAL LOW (ref 44.0–60.0)
pH, Ven: 7.339 (ref 7.250–7.430)
pH, Ven: 7.341 (ref 7.250–7.430)
pO2, Ven: 37 mmHg (ref 32.0–45.0)
pO2, Ven: 38 mmHg (ref 32.0–45.0)

## 2020-02-25 LAB — BASIC METABOLIC PANEL
Anion gap: 13 (ref 5–15)
BUN: 16 mg/dL (ref 8–23)
CO2: 22 mmol/L (ref 22–32)
Calcium: 9.1 mg/dL (ref 8.9–10.3)
Chloride: 100 mmol/L (ref 98–111)
Creatinine, Ser: 1.75 mg/dL — ABNORMAL HIGH (ref 0.61–1.24)
GFR, Estimated: 43 mL/min — ABNORMAL LOW (ref >60)
Glucose, Bld: 112 mg/dL — ABNORMAL HIGH (ref 70–99)
Potassium: 4.7 mmol/L (ref 3.5–5.1)
Sodium: 135 mmol/L (ref 135–145)

## 2020-02-25 LAB — GLUCOSE, CAPILLARY
Glucose-Capillary: 107 mg/dL — ABNORMAL HIGH (ref 70–99)
Glucose-Capillary: 81 mg/dL (ref 70–99)

## 2020-02-25 SURGERY — RIGHT/LEFT HEART CATH AND CORONARY ANGIOGRAPHY
Anesthesia: LOCAL

## 2020-02-25 MED ORDER — IOHEXOL 350 MG/ML SOLN
INTRAVENOUS | Status: DC | PRN
  Administered 2020-02-25: 55 mL

## 2020-02-25 MED ORDER — VERAPAMIL HCL 2.5 MG/ML IV SOLN
INTRAVENOUS | Status: DC | PRN
  Administered 2020-02-25: 10 mL via INTRA_ARTERIAL

## 2020-02-25 MED ORDER — HEPARIN SODIUM (PORCINE) 1000 UNIT/ML IJ SOLN
INTRAMUSCULAR | Status: AC
  Filled 2020-02-25: qty 1

## 2020-02-25 MED ORDER — SODIUM CHLORIDE 0.9 % IV SOLN: INTRAVENOUS | Status: DC

## 2020-02-25 MED ORDER — SODIUM CHLORIDE 0.9 % WEIGHT BASED INFUSION: 1.0000 mL/kg/h | INTRAVENOUS | Status: DC

## 2020-02-25 MED ORDER — SODIUM CHLORIDE 0.9% FLUSH: 3.0000 mL | Freq: Two times a day (BID) | INTRAVENOUS | Status: DC

## 2020-02-25 MED ORDER — FENTANYL CITRATE (PF) 100 MCG/2ML IJ SOLN
INTRAMUSCULAR | Status: DC | PRN
  Administered 2020-02-25: 25 ug via INTRAVENOUS

## 2020-02-25 MED ORDER — NITROGLYCERIN 1 MG/10 ML FOR IR/CATH LAB
INTRA_ARTERIAL | Status: AC
  Filled 2020-02-25: qty 10

## 2020-02-25 MED ORDER — LIDOCAINE HCL (PF) 1 % IJ SOLN
INTRAMUSCULAR | Status: DC | PRN
  Administered 2020-02-25 (×2): 2 mL

## 2020-02-25 MED ORDER — LIDOCAINE HCL (PF) 1 % IJ SOLN
INTRAMUSCULAR | Status: AC
  Filled 2020-02-25: qty 30

## 2020-02-25 MED ORDER — FENTANYL CITRATE (PF) 100 MCG/2ML IJ SOLN
INTRAMUSCULAR | Status: AC
  Filled 2020-02-25: qty 2

## 2020-02-25 MED ORDER — SODIUM CHLORIDE 0.9 % IV SOLN: 250.0000 mL | INTRAVENOUS | Status: DC | PRN

## 2020-02-25 MED ORDER — VERAPAMIL HCL 2.5 MG/ML IV SOLN
INTRAVENOUS | Status: AC
  Filled 2020-02-25: qty 2

## 2020-02-25 MED ORDER — HEPARIN SODIUM (PORCINE) 1000 UNIT/ML IJ SOLN
INTRAMUSCULAR | Status: DC | PRN
  Administered 2020-02-25: 5000 [IU] via INTRAVENOUS

## 2020-02-25 MED ORDER — HEPARIN (PORCINE) IN NACL 1000-0.9 UT/500ML-% IV SOLN
INTRAVENOUS | Status: AC
  Filled 2020-02-25: qty 1000

## 2020-02-25 MED ORDER — HEPARIN (PORCINE) IN NACL 1000-0.9 UT/500ML-% IV SOLN
INTRAVENOUS | Status: DC | PRN
  Administered 2020-02-25 (×2): 500 mL

## 2020-02-25 MED ORDER — MIDAZOLAM HCL 2 MG/2ML IJ SOLN
INTRAMUSCULAR | Status: DC | PRN
  Administered 2020-02-25: 1 mg via INTRAVENOUS

## 2020-02-25 MED ORDER — ASPIRIN 81 MG PO CHEW
81.0000 mg | CHEWABLE_TABLET | ORAL | Status: AC
  Administered 2020-02-25: 81 mg via ORAL
  Filled 2020-02-25: qty 1

## 2020-02-25 MED ORDER — MIDAZOLAM HCL 2 MG/2ML IJ SOLN
INTRAMUSCULAR | Status: AC
  Filled 2020-02-25: qty 2

## 2020-02-25 MED ORDER — LABETALOL HCL 5 MG/ML IV SOLN: 10.0000 mg | INTRAVENOUS | Status: DC | PRN

## 2020-02-25 MED ORDER — HYDRALAZINE HCL 20 MG/ML IJ SOLN: 10.0000 mg | INTRAMUSCULAR | Status: DC | PRN

## 2020-02-25 MED ORDER — SODIUM CHLORIDE 0.9% FLUSH: 3.0000 mL | INTRAVENOUS | Status: DC | PRN

## 2020-02-25 MED ORDER — ONDANSETRON HCL 4 MG/2ML IJ SOLN: 4.0000 mg | Freq: Four times a day (QID) | INTRAMUSCULAR | Status: DC | PRN

## 2020-02-25 MED ORDER — IOHEXOL 350 MG/ML SOLN
INTRAVENOUS | Status: AC
  Filled 2020-02-25: qty 1

## 2020-02-25 MED ORDER — ACETAMINOPHEN 325 MG PO TABS: 650.0000 mg | ORAL_TABLET | ORAL | Status: DC | PRN

## 2020-02-25 SURGICAL SUPPLY — 14 items
BAG SNAP BAND KOVER 36X36 (MISCELLANEOUS) ×1 IMPLANT
CATH 5FR JL3.5 JR4 ANG PIG MP (CATHETERS) ×1 IMPLANT
CATH BALLN WEDGE 5F 110CM (CATHETERS) ×1 IMPLANT
COVER DOME SNAP 22 D (MISCELLANEOUS) ×1 IMPLANT
DEVICE RAD COMP TR BAND LRG (VASCULAR PRODUCTS) ×1 IMPLANT
GLIDESHEATH SLEND SS 6F .021 (SHEATH) ×1 IMPLANT
GUIDEWIRE .025 260CM (WIRE) ×1 IMPLANT
GUIDEWIRE INQWIRE 1.5J.035X260 (WIRE) IMPLANT
INQWIRE 1.5J .035X260CM (WIRE) ×2
KIT ENCORE 26 ADVANTAGE (KITS) ×1 IMPLANT
KIT HEART LEFT (KITS) ×2 IMPLANT
PACK CARDIAC CATHETERIZATION (CUSTOM PROCEDURE TRAY) ×2 IMPLANT
SHEATH GLIDE SLENDER 4/5FR (SHEATH) ×1 IMPLANT
TRANSDUCER W/STOPCOCK (MISCELLANEOUS) ×2 IMPLANT

## 2020-02-25 NOTE — Interval H&P Note (Signed)
History and Physical Interval Note:  02/25/2020 10:24 AM  [name] [name]  has presented today for surgery, with the diagnosis of Congestive heart failure.  The various methods of treatment have been discussed with the patient and family. After consideration of risks, benefits and other options for treatment, the patient has consented to  Procedure(s): RIGHT/LEFT HEART CATH AND CORONARY ANGIOGRAPHY (N/A) as a surgical intervention.  The patient's history has been reviewed, patient examined, no change in status, stable for surgery.  I have reviewed the patient's chart and labs.  Questions were answered to the patient's satisfaction.     [name] [employer]

## 2020-02-25 NOTE — Discharge Instructions (Signed)
Drink plenty of fluids for 48 hours and keep wrist elevated at heart level for 24 hours  Radial Site Care   This sheet gives you information about how to care for yourself after your procedure. Your health care provider may also give you more specific instructions. If you have problems or questions, contact your health care provider. What can I expect after the procedure? After the procedure, it is common to have:  Bruising and tenderness at the catheter insertion area. Follow these instructions at home: Medicines  Take over-the-counter and prescription medicines only as told by your health care provider. Insertion site care 1. Follow instructions from your health care provider about how to take care of your insertion site. Make sure you: ? Wash your hands with soap and water before you change your bandage (dressing). If soap and water are not available, use hand sanitizer. ? Remove your dressing as told by your health care provider. In 24 hours 2. Check your insertion site every day for signs of infection. Check for: ? Redness, swelling, or pain. ? Fluid or blood. ? Pus or a bad smell. ? Warmth. 3. Do not take baths, swim, or use a hot tub until your health care provider approves. 4. You may shower 24-48 hours after the procedure, or as directed by your health care provider. ? Remove the dressing and gently wash the site with plain soap and water. ? Pat the area dry with a clean towel. ? Do not rub the site. That could cause bleeding. 5. Do not apply powder or lotion to the site. Activity   1. For 24 hours after the procedure, or as directed by your health care provider: ? Do not flex or bend the affected arm. ? Do not push or pull heavy objects with the affected arm. ? Do not drive yourself home from the hospital or clinic. You may drive 24 hours after the procedure unless your health care provider tells you not to. ? Do not operate machinery or power tools. 2. Do not lift  anything that is heavier than 10 lb (4.5 kg), or the limit that you are told, until your health care provider says that it is safe.  For 4 days 3. Ask your health care provider when it is okay to: ? Return to work or school. ? Resume usual physical activities or sports. ? Resume sexual activity. General instructions  If the catheter site starts to bleed, raise your arm and put firm pressure on the site. If the bleeding does not stop, get help right away. This is a medical emergency.  If you went home on the same day as your procedure, a responsible adult should be with you for the first 24 hours after you arrive home.  Keep all follow-up visits as told by your health care provider. This is important. Contact a health care provider if:  You have a fever.  You have redness, swelling, or yellow drainage around your insertion site. Get help right away if:  You have unusual pain at the radial site.  The catheter insertion area swells very fast.  The insertion area is bleeding, and the bleeding does not stop when you hold steady pressure on the area.  Your arm or hand becomes pale, cool, tingly, or numb. These symptoms may represent a serious problem that is an emergency. Do not wait to see if the symptoms will go away. Get medical help right away. Call your local emergency services (911 in the [place] Do   not drive yourself to the hospital. Summary  After the procedure, it is common to have bruising and tenderness at the site.  Follow instructions from your health care provider about how to take care of your radial site wound. Check the wound every day for signs of infection.  Do not lift anything that is heavier than 10 lb (4.5 kg), or the limit that you are told, until your health care provider says that it is safe. This information is not intended to replace advice given to you by your health care provider. Make sure you discuss any questions you have with your health care  provider. Document Revised: 02/09/2017 Document Reviewed: 02/09/2017 Elsevier Patient Education  2020 [employer]vier Inc.  

## 2020-02-25 NOTE — Progress Notes (Signed)
Referral form, insurance authorization, and records all faxed to [facility] PET scan dept at [phone]

## 2020-02-26 MED FILL — Nitroglycerin IV Soln 100 MCG/ML in D5W: INTRA_ARTERIAL | Qty: 10 | Status: AC

## 2020-03-04 ENCOUNTER — Ambulatory Visit (HOSPITAL_BASED_OUTPATIENT_CLINIC_OR_DEPARTMENT_OTHER): Admission: RE | Admit: 2020-03-04 | Payer: Medicare PPO | Source: Ambulatory Visit

## 2020-03-04 ENCOUNTER — Telehealth (HOSPITAL_COMMUNITY): Payer: Self-pay | Admitting: Vascular Surgery

## 2020-03-04 NOTE — Telephone Encounter (Signed)
Left pt message / that ct will be rescheduled, pt called could not mae his appt 2/15 @ 1:30

## 2020-03-05 NOTE — Progress Notes (Signed)
Cardiology Office Note    Date:  03/11/2020   ID:  [name] [name], [name] [name] 10/01/1955, MRN [id number]   PCP:  Lucia Gaskins, MD   [place]  Cardiologist:  Carlyle Dolly, MD  Advanced Practice Provider:  No care team member to display Electrophysiologist:  None   :[id number]}   No chief complaint on file.   History of Present Illness:  [name] [name] is a 65 y.o. male with long history of cardiomyopathy, paroxysmal atrial fibrillation, and CKD stage 3 was referred by Dr. Harl Bowie for evaluation of CHF. 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 Patient was diagnosed with CHF in 2013.  At that time, EF was 15%.  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  Since that time, repeat echoes have shown EF < 35%.  Most recent study was a cardiac MRI in 1/22.  This showed LV EF 26%.  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  Patient has no family history of sarcoidosis.  His father had CHF, he is not sure of etiology.  Patient was found to have an LV thrombus.  Initially on warfarin, now on Eliquis.  He has a history of paroxysmal AF, currently in NSR on amiodarone.  He is still smoking 1-2 cigarettes/day.  He drinks a couple beers/week.     Patient was  referred to Dr. Aundra Dubin who did a cardiac cath 02/25/2020 showed no obstructive CAD, low but not markedly low cardiac output minimal contrast use.  Cardiac MRI severe LV function EF 26% evidence of myocardial edema see below for details, patient referred to [facility] for PET scan sarcoid work-up. Entresto started.  Patient comes in for f/u. Denies chest pain or shortness of breath or edema. Tolerating entresto but BP low-no dizziness. Started on Farxiga by PCP. For CT tomorrow to look for pulmonary sarcoidosis.   Past Medical History:  Diagnosis Date  . Anxiety   . CHF (congestive heart failure) ([place])    a. EF  15% in 2013 with cath showing normal cors b. EF 30-35% by repeat echo in 2015  . Chronic systolic heart failure ([place])   . Diabetes mellitus   . Fluid retention in legs   . Hypertension     Past Surgical History:  Procedure Laterality Date  . COLONOSCOPY  01/04/2012   Procedure: COLONOSCOPY;  Surgeon: Danie Binder, MD;  Location[facility];  Service: Endoscopy;  Laterality: N/A;  1:30 PM  . HERNIA REPAIR    . LEFT AND RIGHT HEART CATHETERIZATION WITH CORONARY ANGIOGRAM N/A 07/09/2011   Procedure: LEFT AND RIGHT HEART CATHETERIZATION WITH CORONARY ANGIOGRAM;  Surgeon: Peter M Martinique, MD;  Location: [facility] CATH LAB;  Service: Cardiovascular;  Laterality: N/A;  . RIGHT/LEFT HEART CATH AND CORONARY ANGIOGRAPHY N/A 02/25/2020   Procedure: RIGHT/LEFT HEART CATH AND CORONARY ANGIOGRAPHY;  Surgeon: Larey Dresser, MD;  Location: [facility] CV LAB;  Service: Cardiovascular;  Laterality: N/A;    Current Medications: Current Meds  Medication Sig  . amiodarone (PACERONE) 200 MG tablet Take 1 tablet (200 mg total) by mouth daily.  Marland Kitchen apixaban (ELIQUIS) 5 MG TABS tablet Take 5 mg by mouth 2 (two) times daily.  . Blood Glucose Calibration (TRUE METRIX LEVEL 1) Low SOLN   . Blood Glucose Monitoring Suppl (TRUE METRIX METER) w/Device KIT   . Cholecalciferol (VITAMIN D-3) 125 MCG (5000 UT) TABS Take 5,000 Units by mouth  daily.  . dapagliflozin propanediol (FARXIGA) 10 MG TABS tablet Take 10 mg by mouth daily.  Marland Kitchen gabapentin (NEURONTIN) 100 MG capsule Take 200 mg by mouth 3 (three) times daily.  Marland Kitchen levothyroxine (SYNTHROID) 50 MCG tablet Take 1 tablet (50 mcg total) by mouth daily before breakfast.  . Magnesium 250 MG TABS Take 250-500 mg by mouth See admin instructions. Take 2 tablets (500 mg) by mouth in the morning & take 1 tablet (250 mg) by mouth in the evening.  . metFORMIN (GLUCOPHAGE) 500 MG tablet Take 500 mg by mouth 2 (two) times daily.  . metoprolol succinate (TOPROL-XL) 25 MG 24 hr tablet Take  1.5 tablets (37.5 mg total) by mouth daily.  . Oxycodone HCl 10 MG TABS Take 10 mg by mouth 4 (four) times daily as needed (for pain).   . potassium chloride SA (KLOR-CON) 20 MEQ tablet Take 2 tablets (40 mEq total) by mouth daily.  . sacubitril-valsartan (ENTRESTO) 24-26 MG Take 1 tablet by mouth 2 (two) times daily.  . simvastatin (ZOCOR) 40 MG tablet Take 1 tablet (40 mg total) by mouth at bedtime.  Marland Kitchen spironolactone (ALDACTONE) 25 MG tablet Take 1 tablet (25 mg total) by mouth daily.  . TRUE METRIX BLOOD GLUCOSE TEST test strip   . TRUEplus Lancets 33G MISC      Allergies:   Patient has no known allergies.   Social History   Socioeconomic History  . Marital status: Married    Spouse name: Not on file  . Number of children: 0  . Years of education: Not on file  . Highest education level: Not on file  Occupational History  . Occupation: disabled  Tobacco Use  . Smoking status: Current Some Day Smoker    Packs/day: 0.25    Types: Cigarettes, E-cigarettes    Start date: 10/13/1982    Last attempt to quit: 09/19/2011    Years since quitting: 8.4  . Smokeless tobacco: Never Used  . Tobacco comment: electronic cig for 4 months   Vaping Use  . Vaping Use: Never used  Substance and Sexual Activity  . Alcohol use: Not Currently    Comment: weekends  . Drug use: No  . Sexual activity: Not on file  Other Topics Concern  . Not on file  Social History Narrative   Patient is right handed.   Patient seldom drinks caffeine.   Social Determinants of Health   Financial Resource Strain: Not on file  Food Insecurity: Not on file  Transportation Needs: Not on file  Physical Activity: Not on file  Stress: Not on file  Social Connections: Not on file     Family History:  The patient's family history includes Cancer in his mother; Heart attack in his father; Heart disease in his sister; Heart failure in his father.   ROS:   Please see the history of present illness.    ROS All other  systems reviewed and are negative.   PHYSICAL EXAM:   VS:  BP (!) 102/58   Pulse 72   Ht 6' (1.829 m)   Wt 216 lb (98 kg)   SpO2 98%   BMI 29.29 kg/m   Physical Exam  GEN: Well nourished, well developed, in no acute distress  Neck: no JVD, carotid bruits, or masses Cardiac:RRR; no murmurs, rubs, or gallops  Respiratory:  clear to auscultation bilaterally, normal work of breathing GI: soft, nontender, nondistended, + BS YKD:XIPJA arm stable at cath site without hematoma or hemmorhage without cyanosis,  clubbing, or edema, Good distal pulses bilaterally Neuro:  Alert and Oriented x 3 Psych: euthymic mood, full affect  Wt Readings from Last 3 Encounters:  03/11/20 216 lb (98 kg)  02/25/20 215 lb (97.5 kg)  02/14/20 216 lb 3.2 oz (98.1 kg)      Studies/Labs Reviewed:   EKG:  EKG is not ordered today.   Recent Labs: 08/31/2019: Magnesium 1.7 02/14/2020: ALT 18; B Natriuretic Peptide 116.8; Platelets 224 02/19/2020: TSH 146.543 02/25/2020: BUN 16; Creatinine, Ser 1.75; Hemoglobin 13.3; Hemoglobin 13.3; Potassium 4.8; Potassium 4.7; Sodium 136; Sodium 136   Lipid Panel    Component Value Date/Time   CHOL 132 08/14/2014 1030   TRIG 134 08/14/2014 1030   HDL 50 08/14/2014 1030   CHOLHDL 2.6 08/14/2014 1030   VLDL 27 08/14/2014 1030   LDLCALC 55 08/14/2014 1030    Additional studies/ records that were reviewed today include:  MRA 02/04/20  IMPRESSION: 1. Severe left ventricular systolic dysfunction with mild LV chamber enlargement, LVEF 26%. Severe global hypokinesis with akinesis of the basal and mid anteroseptum. Wall thinning in the basal anteroseptum. No LV thrombus.   2. Transmural delayed myocardial enhancement in the basal anteroseptum with associated myocardial edema.   3. There is a dense, midmyocardial stripe of delayed enhancement extending from the base to the apex in the ventricular septum. Subendocardial and mid-myocardial delayed enhancement in the  basal anterior, inferoseptal and inferior walls. Mid-inferior wall has mid myocardial and epicardial delayed enhancement.   4.  Grossly normal first pass perfusion.   5. Moderate right ventricular systolic dysfunction with normal RV chamber size, RVEF 33%. No RV delayed enhancement.   Delayed myocardial enhancement in a non-coronary distribution with associated myocardial edema and focal wall thinning and regional wall motion abnormality is most consistent with granulomatous disease such as cardiac sarcoidosis. Myocardial edema suggests possible active inflammation.   Consider cardiac PET with sarcoid diet protocol for further evaluation.     Electronically Signed   By: Cherlynn Kaiser   On: 02/05/2020 15:16   R/L cath 02/25/20  Normal/low filling pressures.  2. Low but not markedly low cardiac output.  3. No obstructive CAD, nonischemic cardiomyopathy.    Minimal contrast used.    Risk Assessment/Calculations:    CHA2DS2-VASc Score = 3  This indicates a 3.2% annual risk of stroke. The patient's score is based upon: CHF History: Yes HTN History: Yes Diabetes History: Yes Stroke History: No Vascular Disease History: No Age Score: 0 Gender Score: 0        ASSESSMENT:    1. Chronic systolic CHF (congestive heart failure) (H[facility])   2. Paroxysmal atrial fibrillation ([place])   3. LV (left ventricular) mural thrombus      PLAN:  In order of problems listed above:    1. Chronic systolic CHF: Etiology of cardiomyopathy is uncertain.  EF has been known to be low since 2013, cardiac cath in 2013 showed nonobstructive CAD.  Most recent study was a cardiac MRI in 1/22 showing LV EF 26% with mild LVE, akinesis of the basal to mid anteroseptal wall, RV EF 33%, delayed enhancement showed transmural LGE basal anteroseptal wall, mid-myocardial LGE in the septum, subendocardial to mid myocardial LGE in the basal anterior, basal inferoseptal, and basal inferior walls.  Most  consistent with cardiac sarcoidosis, cath no CAD.  No family history of sarcoidosis, no known lung disease, rashes, or joint pain.  NYHA class II-III symptoms, he is not volume overloaded on exam.  Right  and left heart cath showed no obstructive CAD low but not markedly low cardiac output.  Referred to [facility] for PET scan -On Entresto 24/26 bid. BMET -[facility] cardiac PET at [facility] to assess for cardiac sarcoidosis.  - Dr. Aundra Dubin discussed ICD.  He is not CRT candidate with RBBB.  patient awaiting sarcoidosis workup before contemplating ICD. For CT tomorrow to rule out pulmonary sarcoidosis  2. Paroxysmal Atrial fibrillation: in NSR on amiodarone.  - Continue apixaban, Continue amiodarone 200 daily.    3. LV thrombus: He has been on Eliquis. No thrombus on 1/22 cMRI.   4. CKD: Stage 3. crt 1.75 02/25/20.      Shared Decision Making/Informed Consent        Medication Adjustments/Labs and Tests Ordered: Current medicines are reviewed at length with the patient today.  Concerns regarding medicines are outlined above.  Medication changes, Labs and Tests ordered today are listed in the Patient Instructions below. Patient Instructions  Medication Instructions:  Your physician recommends that you continue on your current medications as directed. Please refer to the Current Medication list given to you today.  *If you need a refill on your cardiac medications before your next appointment, please call your pharmacy*   Lab Work: None If you have labs (blood work) drawn today and your tests are completely normal, you will receive your results only by: Marland Kitchen MyChart Message (if you have MyChart) OR . A paper copy in the mail If you have any lab test that is abnormal or we need to change your treatment, we will call you to review the results.   Testing/Procedures: None   Follow-Up: At [facility], you and your health needs are our priority.  As part of our continuing mission to provide you with  exceptional heart care, we have created designated Provider Care Teams.  These Care Teams include your primary Cardiologist (physician) and Advanced Practice Providers (APPs -  Physician Assistants and Nurse Practitioners) who all work together to provide you with the care you need, when you need it.  We recommend signing up for the patient portal called "MyChart".  Sign up information is provided on this After Visit Summary.  MyChart is used to connect with patients for Virtual Visits (Telemedicine).  Patients are able to view lab/test results, encounter notes, upcoming appointments, etc.  Non-urgent messages can be sent to your provider as well.   To learn more about what you can do with MyChart, go to [email or web address].    Your next appointment:   2 month(s)  The format for your next appointment:   In Person  Provider:   Carlyle Dolly, MD   Other Instructions None        Signed, Ermalinda Barrios, PA-C  03/11/2020 2:07 PM    [address], [place], [place]  [address] Phone: [phone]; Fax: [phone]

## 2020-03-06 ENCOUNTER — Ambulatory Visit (HOSPITAL_BASED_OUTPATIENT_CLINIC_OR_DEPARTMENT_OTHER): Payer: Medicare PPO

## 2020-03-06 ENCOUNTER — Encounter (HOSPITAL_COMMUNITY): Payer: Medicare PPO | Admitting: Cardiology

## 2020-03-11 ENCOUNTER — Ambulatory Visit: Payer: Medicare PPO | Admitting: Physician Assistant

## 2020-03-11 ENCOUNTER — Encounter: Payer: Self-pay | Admitting: Physician Assistant

## 2020-03-11 ENCOUNTER — Other Ambulatory Visit: Payer: Self-pay

## 2020-03-11 VITALS — BP 102/58 | HR 72 | Ht 72.0 in | Wt 216.0 lb

## 2020-03-11 DIAGNOSIS — I480 Paroxysmal atrial fibrillation: Secondary | ICD-10-CM

## 2020-03-11 DIAGNOSIS — I513 Intracardiac thrombosis, not elsewhere classified: Secondary | ICD-10-CM

## 2020-03-11 DIAGNOSIS — I5022 Chronic systolic (congestive) heart failure: Secondary | ICD-10-CM | POA: Diagnosis not present

## 2020-03-11 NOTE — Patient Instructions (Signed)
Medication Instructions:  Your physician recommends that you continue on your current medications as directed. Please refer to the Current Medication list given to you today.  *If you need a refill on your cardiac medications before your next appointment, please call your pharmacy*   Lab Work: None If you have labs (blood work) drawn today and your tests are completely normal, you will receive your results only by: Marland Kitchen MyChart Message (if you have MyChart) OR . A paper copy in the mail If you have any lab test that is abnormal or we need to change your treatment, we will call you to review the results.   Testing/Procedures: None   Follow-Up: At [facility], you and your health needs are our priority.  As part of our continuing mission to provide you with exceptional heart care, we have created designated Provider Care Teams.  These Care Teams include your primary Cardiologist (physician) and Advanced Practice Providers (APPs -  Physician Assistants and Nurse Practitioners) who all work together to provide you with the care you need, when you need it.  We recommend signing up for the patient portal called "MyChart".  Sign up information is provided on this After Visit Summary.  MyChart is used to connect with patients for Virtual Visits (Telemedicine).  Patients are able to view lab/test results, encounter notes, upcoming appointments, etc.  Non-urgent messages can be sent to your provider as well.   To learn more about what you can do with MyChart, go to [email or web address].    Your next appointment:   2 month(s)  The format for your next appointment:   In Person  Provider:   Carlyle Dolly, MD   Other Instructions None

## 2020-03-12 ENCOUNTER — Ambulatory Visit (HOSPITAL_BASED_OUTPATIENT_CLINIC_OR_DEPARTMENT_OTHER)
Admission: RE | Admit: 2020-03-12 | Discharge: 2020-03-12 | Disposition: A | Discharge status: Home / Self Care | Payer: Medicare PPO | Source: Ambulatory Visit | Attending: Cardiology | Admitting: Cardiology

## 2020-03-12 DIAGNOSIS — I420 Dilated cardiomyopathy: Secondary | ICD-10-CM | POA: Insufficient documentation

## 2020-03-14 ENCOUNTER — Telehealth: Payer: Self-pay | Admitting: Physician Assistant

## 2020-03-14 NOTE — Telephone Encounter (Signed)
Patient will call Dr.McLeans office, they are the ones who called him.

## 2020-03-14 NOTE — Telephone Encounter (Signed)
Patient is calling for recent test results.

## 2020-03-18 ENCOUNTER — Encounter (HOSPITAL_COMMUNITY): Payer: Medicare PPO | Admitting: Cardiology

## 2020-03-21 ENCOUNTER — Encounter (HOSPITAL_COMMUNITY): Payer: Medicare PPO | Admitting: Cardiology

## 2020-03-24 ENCOUNTER — Other Ambulatory Visit (HOSPITAL_COMMUNITY): Payer: Medicare PPO

## 2020-03-25 ENCOUNTER — Other Ambulatory Visit: Payer: Self-pay

## 2020-03-25 ENCOUNTER — Telehealth (HOSPITAL_COMMUNITY): Payer: Self-pay | Admitting: Pharmacy Technician

## 2020-03-25 ENCOUNTER — Encounter (HOSPITAL_COMMUNITY): Payer: Self-pay | Admitting: Cardiology

## 2020-03-25 ENCOUNTER — Telehealth (HOSPITAL_COMMUNITY): Payer: Self-pay

## 2020-03-25 ENCOUNTER — Ambulatory Visit (HOSPITAL_COMMUNITY)
Admission: RE | Admit: 2020-03-25 | Discharge: 2020-03-25 | Disposition: A | Discharge status: Home / Self Care | Payer: Medicare PPO | Source: Ambulatory Visit | Attending: Cardiology | Admitting: Cardiology

## 2020-03-25 VITALS — BP 110/70 | HR 66 | Wt 213.6 lb

## 2020-03-25 DIAGNOSIS — Z7984 Long term (current) use of oral hypoglycemic drugs: Secondary | ICD-10-CM | POA: Diagnosis not present

## 2020-03-25 DIAGNOSIS — I2510 Atherosclerotic heart disease of native coronary artery without angina pectoris: Secondary | ICD-10-CM | POA: Diagnosis not present

## 2020-03-25 DIAGNOSIS — Z8249 Family history of ischemic heart disease and other diseases of the circulatory system: Secondary | ICD-10-CM | POA: Diagnosis not present

## 2020-03-25 DIAGNOSIS — N183 Chronic kidney disease, stage 3 unspecified: Secondary | ICD-10-CM | POA: Diagnosis not present

## 2020-03-25 DIAGNOSIS — R5383 Other fatigue: Secondary | ICD-10-CM | POA: Insufficient documentation

## 2020-03-25 DIAGNOSIS — I480 Paroxysmal atrial fibrillation: Secondary | ICD-10-CM | POA: Insufficient documentation

## 2020-03-25 DIAGNOSIS — I513 Intracardiac thrombosis, not elsewhere classified: Secondary | ICD-10-CM | POA: Insufficient documentation

## 2020-03-25 DIAGNOSIS — E1122 Type 2 diabetes mellitus with diabetic chronic kidney disease: Secondary | ICD-10-CM | POA: Insufficient documentation

## 2020-03-25 DIAGNOSIS — Z7901 Long term (current) use of anticoagulants: Secondary | ICD-10-CM | POA: Insufficient documentation

## 2020-03-25 DIAGNOSIS — F17210 Nicotine dependence, cigarettes, uncomplicated: Secondary | ICD-10-CM | POA: Insufficient documentation

## 2020-03-25 DIAGNOSIS — Z79899 Other long term (current) drug therapy: Secondary | ICD-10-CM | POA: Insufficient documentation

## 2020-03-25 DIAGNOSIS — I5022 Chronic systolic (congestive) heart failure: Secondary | ICD-10-CM | POA: Diagnosis not present

## 2020-03-25 LAB — COMPREHENSIVE METABOLIC PANEL
ALT: 18 U/L (ref 0–44)
AST: 21 U/L (ref 15–41)
Albumin: 3.9 g/dL (ref 3.5–5.0)
Alkaline Phosphatase: 50 U/L (ref 38–126)
Anion gap: 13 (ref 5–15)
BUN: 14 mg/dL (ref 8–23)
CO2: 22 mmol/L (ref 22–32)
Calcium: 9.3 mg/dL (ref 8.9–10.3)
Chloride: 98 mmol/L (ref 98–111)
Creatinine, Ser: 1.74 mg/dL — ABNORMAL HIGH (ref 0.61–1.24)
GFR, Estimated: 44 mL/min — ABNORMAL LOW (ref >60)
Glucose, Bld: 160 mg/dL — ABNORMAL HIGH (ref 70–99)
Potassium: 4 mmol/L (ref 3.5–5.1)
Sodium: 133 mmol/L — ABNORMAL LOW (ref 135–145)
Total Bilirubin: 0.6 mg/dL (ref 0.3–1.2)
Total Protein: 7.1 g/dL (ref 6.5–8.1)

## 2020-03-25 LAB — TSH: TSH: 103.802 u[IU]/mL — ABNORMAL HIGH (ref 0.350–4.500)

## 2020-03-25 MED ORDER — ENTRESTO 49-51 MG PO TABS: 1.0000 | ORAL_TABLET | Freq: Two times a day (BID) | ORAL | 11 refills | Status: DC

## 2020-03-25 MED ORDER — POTASSIUM CHLORIDE CRYS ER 20 MEQ PO TBCR: 20.0000 meq | EXTENDED_RELEASE_TABLET | Freq: Every day | ORAL | 3 refills | Status: DC

## 2020-03-25 MED ORDER — LEVOTHYROXINE SODIUM 100 MCG PO TABS: 100.0000 ug | ORAL_TABLET | Freq: Every day | ORAL | 3 refills | Status: DC

## 2020-03-25 NOTE — Progress Notes (Signed)
PCP: Lucia Gaskins, MD Cardiology: Dr. Harl Bowie HF Cardiology: Dr. Aundra Dubin  65 y.o. with long history of cardiomyopathy, paroxysmal atrial fibrillation, and CKD stage 3 was referred by Dr. Harl Bowie for evaluation of CHF. 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 Patient was diagnosed with CHF in 2013.  At that time, EF was 15%.  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  Since that time, repeat echoes have shown EF < 35%.  Most recent study was a cardiac MRI in 1/22.  This showed LV EF 26%.  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  Patient has no family history of sarcoidosis.  His father had CHF, he is not sure of etiology.  Patient was found to have an LV thrombus.  Initially on warfarin, now on Eliquis.  He has a history of paroxysmal AF, currently in NSR on amiodarone.  He is still smoking 1-2 cigarettes/day.  He drinks a couple beers/week.   LHC/RHC in 2/22 showed no significant CAD, low filling pressures, and low but not markedly low cardiac index. CT chest did not show definite evidence for pulmonary sarcoidosis.   Patient returns for followup of CHF.  He is stable symptomatically.  He has generalized fatigue. No significant dyspnea walking on flat ground or up a flight of stairs.  No chest pain.  No orthopnea/PND.  No lightheadedness. Weight is down 3 lbs.    Labs (12/21): K 4.7, creatinine 1.57 Labs (2/22): ACE level normal, K 4.7, creatinine 1.75  PMH: 1. Hyperlipidemia 2. Atrial fibrillation: Paroxysmal, first diagnosed in 4/21.  3. CKD stage 3 4. H/o NSVT 5. Type 2 diabetes 6. Chronic systolic CHF: Echo in 7673 with EF 15%.  LHC in 2013 with nonobstructive CAD.  - Echo (2015): EF 30-35%.  - Echo (4/21): EF 25-30%, moderate RV dysfunction, LV thrombus.  - Cardiac MRI (1/22): LV EF 26% with mild LVE, akinesis of the basal to mid anteroseptal wall, RV EF 33%, delayed  enhancement showed transmural LGE basal anteroseptal wall, mid-myocardial LGE in the septum, subendocardial to mid myocardial LGE in the basal anterior, basal inferoseptal, and basal inferior walls.  Most consistent with cardiac sarcoidosis.  - CT chest (2/22): respiratory bronchiolitis, emphysema, no evidence for pulmonary sarcoidosis.  - LHC/RHC (2/22): No significant CAD; mean RA 3, PA 28/5, mean PCWP 10, CI 2.04 7. H/o LV thrombus  Social History   Socioeconomic History  . Marital status: Married    Spouse name: Not on file  . Number of children: 0  . Years of education: Not on file  . Highest education level: Not on file  Occupational History  . Occupation: disabled  Tobacco Use  . Smoking status: Current Some Day Smoker    Packs/day: 0.25    Types: Cigarettes, E-cigarettes    Start date: 10/13/1982    Last attempt to quit: 09/19/2011    Years since quitting: 8.5  . Smokeless tobacco: Never Used  . Tobacco comment: electronic cig for 4 months   Vaping Use  . Vaping Use: Never used  Substance and Sexual Activity  . Alcohol use: Not Currently    Comment: weekends  . Drug use: No  . Sexual activity: Not on file  Other Topics Concern  . Not on file  Social History Narrative   Patient is right handed.   Patient seldom drinks caffeine.   Social Determinants of Health   Financial Resource Strain: Not on file  Food Insecurity: Not on file  Transportation Needs: Not on file  Physical Activity: Not on file  Stress: Not on file  Social Connections: Not on file  Intimate Partner Violence: Not on file   Family History  Problem Relation Age of Onset  . Heart attack Father   . Heart failure Father   . Cancer Mother        Multiple myeloma  . Heart disease Sister    ROS: All systems reviewed and negative except as per HPI.   Current Outpatient Medications  Medication Sig Dispense Refill  . amiodarone (PACERONE) 200 MG tablet Take 1 tablet (200 mg total) by mouth daily. 90  tablet 3  . apixaban (ELIQUIS) 5 MG TABS tablet Take 5 mg by mouth 2 (two) times daily.    . Blood Glucose Calibration (TRUE METRIX LEVEL 1) Low SOLN     . Blood Glucose Monitoring Suppl (TRUE METRIX METER) w/Device KIT     . Cholecalciferol (VITAMIN D-3) 125 MCG (5000 UT) TABS Take 5,000 Units by mouth daily.    . dapagliflozin propanediol (FARXIGA) 10 MG TABS tablet Take 10 mg by mouth daily.    Marland Kitchen gabapentin (NEURONTIN) 100 MG capsule Take 200 mg by mouth 3 (three) times daily.    . Magnesium 250 MG TABS Take 250-500 mg by mouth See admin instructions. Take 2 tablets (500 mg) by mouth in the morning & take 1 tablet (250 mg) by mouth in the evening.    . metFORMIN (GLUCOPHAGE) 500 MG tablet Take 500 mg by mouth 2 (two) times daily.    . metoprolol succinate (TOPROL-XL) 25 MG 24 hr tablet Take 1.5 tablets (37.5 mg total) by mouth daily. 135 tablet 3  . Oxycodone HCl 10 MG TABS Take 10 mg by mouth 4 (four) times daily as needed (for pain).     . sacubitril-valsartan (ENTRESTO) 49-51 MG Take 1 tablet by mouth 2 (two) times daily. 60 tablet 11  . simvastatin (ZOCOR) 40 MG tablet Take 1 tablet (40 mg total) by mouth at bedtime. 90 tablet 1  . spironolactone (ALDACTONE) 25 MG tablet Take 1 tablet (25 mg total) by mouth daily. 90 tablet 3  . TRUE METRIX BLOOD GLUCOSE TEST test strip     . TRUEplus Lancets 33G MISC     . levothyroxine (SYNTHROID) 100 MCG tablet Take 1 tablet (100 mcg total) by mouth daily before breakfast. 90 tablet 3  . potassium chloride SA (KLOR-CON) 20 MEQ tablet Take 1 tablet (20 mEq total) by mouth daily. 90 tablet 3   No current facility-administered medications for this encounter.   BP 110/70   Pulse 66   Wt 96.9 kg (213 lb 9.6 oz)   SpO2 95%   BMI 28.97 kg/m  General: NAD Neck: No JVD, no thyromegaly or thyroid nodule.  Lungs: Clear to auscultation bilaterally with normal respiratory effort. CV: Nondisplaced PMI.  Heart regular S1/S2, no S3/S4, no murmur.  No  peripheral edema.  No carotid bruit.  Normal pedal pulses.  Abdomen: Soft, nontender, no hepatosplenomegaly, no distention.  Skin: Intact without lesions or rashes.  Neurologic: Alert and oriented x 3.  Psych: Normal affect. Extremities: No clubbing or cyanosis.  HEENT: Normal.   Assessment/Plan: 1. Chronic systolic CHF: Etiology of cardiomyopathy is uncertain.  EF has been known to be low since 2013, cardiac cath in 2013 showed nonobstructive CAD.  Most recent study was a cardiac MRI in 1/22 showing LV EF 26% with mild LVE, akinesis of  the basal to mid anteroseptal wall, RV EF 33%, delayed enhancement showed transmural LGE basal anteroseptal wall, mid-myocardial LGE in the septum, subendocardial to mid myocardial LGE in the basal anterior, basal inferoseptal, and basal inferior walls.  Most consistent with cardiac sarcoidosis.  No family history of sarcoidosis, no known lung disease, rashes, or joint pain. CT chest in 2/22 not suggestive of pulmonary sarcoidosis.  RHC/LHC in 2/22 showed no significant CAD, low filling pressures, and CI 2.04.  NYHA class II symptoms, he is not volume overloaded on exam.  - Increase Entresto to 49/51 bid. BMET today and repeat in 10 days.  - Continue spironolactone 25 mg daily.  - Continue Toprol XL 37.5 mg daily.  - Continue dapagliflozin 10 mg daily.  - Can decrease KCl to 20 daily.  - He will get a cardiac PET at [facility] later this week to assess for cardiac sarcoidosis.  - We discussed ICD.  He is not CRT candidate with RBBB.  He wants to wait a while on this though I encouraged placement given myocardial LGE and possible cardiac sarcoidosis.  2. Atrial fibrillation: Paroxysmal.  He is in NSR on amiodarone.  - Continue apixaban.  - Continue amiodarone 200 daily.  Check LFTs/TSH today.  He will need a regular eye exam given amiodarone use.  3. LV thrombus: He has been on Eliquis. No thrombus on 1/22 cMRI.  4. CKD: Stage 3.  BMET today.   Followup in 1 month  with HF pharmacist for medication titration, see me in 2 months.    Loralie Champagne 03/25/2020

## 2020-03-25 NOTE — Telephone Encounter (Signed)
Patient was seen in clinic and stated that he is having a hard time affording Entresto. His current 30 day co-pay is $294.42 (30 days). Delene Loll is being increased to 49-51mg  BID.   Started an Land for [employer] assistance. Will fax in once signatures are obtained.

## 2020-03-25 NOTE — Telephone Encounter (Signed)
-----   Message from Larey Dresser, MD sent at 03/25/2020  2:33 PM EST ----- TSH is still very high.  Increase Levoxyl to 100 mcg daily and have him followup with PCP about this.

## 2020-03-25 NOTE — Telephone Encounter (Signed)
Patient advised and verbalized understanding. New Rx sent into patients pharmacy. Patient will fu with pcp.  Meds ordered this encounter  Medications  . levothyroxine (SYNTHROID) 100 MCG tablet    Sig: Take 1 tablet (100 mcg total) by mouth daily before breakfast.    Dispense:  90 tablet    Refill:  3    Please cancel all previous orders for current medication. Change in dosage or pill size.

## 2020-03-25 NOTE — Patient Instructions (Signed)
Labs done today. We will contact you only if your labs are abnormal.  INCREASE Entresto to 49-51mg  (1 tablet) by mouth 2 times daily.  DECREASE Potassium to 62meq(1 tablet) by mouth daily.  No other medication changes were made. Please continue all current medications as prescribed.  Your physician recommends that you schedule a follow-up appointment in: 10 days for a lab only appointment in [place], 1 month for an appointment with our Clinic Pharmacist and in 2 months with Dr. Aundra Dubin   If you have any questions or concerns before your next appointment please send [place] a message through [facility] or call our office at [phone].    TO LEAVE A MESSAGE FOR THE NURSE SELECT OPTION 2, PLEASE LEAVE A MESSAGE INCLUDING: . YOUR NAME . DATE OF BIRTH . CALL BACK NUMBER . REASON FOR CALL**this is important as we prioritize the call backs  YOU WILL RECEIVE A CALL BACK THE SAME DAY AS LONG AS YOU CALL BEFORE 4:00 PM   Do the following things EVERYDAY: 1) Weigh yourself in the morning before breakfast. Write it down and keep it in a log. 2) Take your medicines as prescribed 3) Eat low salt foods--Limit salt (sodium) to 2000 mg per day.  4) Stay as active as you can everyday 5) Limit all fluids for the day to less than 2 liters   At the [facility], you and your health needs are our priority. As part of our continuing mission to provide you with exceptional heart care, we have created designated Provider Care Teams. These Care Teams include your primary Cardiologist (physician) and Advanced Practice Providers (APPs- Physician Assistants and Nurse Practitioners) who all work together to provide you with the care you need, when you need it.   You may see any of the following providers on your designated Care Team at your next follow up: Marland Kitchen Dr Glori Bickers . Dr Loralie Champagne . Darrick Grinder, NP . Lyda Jester, PA . Audry Riles, PharmD   Please be sure to bring in all  your medications bottles to every appointment.

## 2020-03-26 NOTE — Telephone Encounter (Signed)
Sent in application via fax.  Will follow up.  

## 2020-04-01 NOTE — Telephone Encounter (Signed)
Advanced Heart Failure Patient Advocate Encounter   Patient was approved to receive Entresto from [employer]  Patient ID: [id number] Effective dates: 04/01/19 through 01/17/21  Spoke with patient.  Charlann Boxer, CPhT

## 2020-04-02 ENCOUNTER — Encounter (HOSPITAL_COMMUNITY): Payer: Self-pay | Admitting: *Deleted

## 2020-04-02 NOTE — Progress Notes (Signed)
Cardiac PET Scan done 03/28/20 at [facility], results received and placed for Dr Aundra Dubin to review.

## 2020-04-07 ENCOUNTER — Other Ambulatory Visit: Payer: Self-pay

## 2020-04-07 ENCOUNTER — Other Ambulatory Visit (HOSPITAL_COMMUNITY)
Admission: RE | Admit: 2020-04-07 | Discharge: 2020-04-07 | Disposition: A | Discharge status: Home / Self Care | Payer: Medicare PPO | Source: Ambulatory Visit | Attending: Cardiology | Admitting: Cardiology

## 2020-04-07 DIAGNOSIS — I5022 Chronic systolic (congestive) heart failure: Secondary | ICD-10-CM

## 2020-04-07 LAB — BASIC METABOLIC PANEL
Anion gap: 10 (ref 5–15)
BUN: 20 mg/dL (ref 8–23)
CO2: 22 mmol/L (ref 22–32)
Calcium: 8.9 mg/dL (ref 8.9–10.3)
Chloride: 108 mmol/L (ref 98–111)
Creatinine, Ser: 1.59 mg/dL — ABNORMAL HIGH (ref 0.61–1.24)
GFR, Estimated: 48 mL/min — ABNORMAL LOW (ref >60)
Glucose, Bld: 144 mg/dL — ABNORMAL HIGH (ref 70–99)
Potassium: 4.5 mmol/L (ref 3.5–5.1)
Sodium: 140 mmol/L (ref 135–145)

## 2020-04-07 LAB — T4, FREE: Free T4: 0.86 ng/dL (ref 0.61–1.12)

## 2020-04-07 LAB — TSH: TSH: 46.703 u[IU]/mL — ABNORMAL HIGH (ref 0.350–4.500)

## 2020-04-08 LAB — T3, FREE: T3, Free: 2.1 pg/mL (ref 2.0–4.4)

## 2020-04-27 NOTE — Progress Notes (Incomplete)
***In Progress*** PCP: Lucia Gaskins, MD Cardiology: Dr. Harl Bowie HF Cardiology: Dr. Aundra Dubin  HPI:   65 y.o. with long history of cardiomyopathy, paroxysmal atrial fibrillation, and CKD stage 3 was referred by Dr. Harl Bowie for evaluation of CHF. 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  At that time, EF was 15%.  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  Since that time, repeat echoes have shown EF < 35%.  Most recent study was a cardiac MRI in 01/2020.  This showed LV EF 26%.  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  Patient has no family history of sarcoidosis.  His father had CHF, he is not sure of etiology.  Patient was found to have an LV thrombus in 04/2019.  Initially on warfarin, now on Eliquis.  He has a history of paroxysmal AF, currently in NSR on amiodarone.  He is still smoking 1-2 cigarettes/day.  He drinks a couple beers/week.   LHC/RHC in 02/2020 showed no significant CAD, low filling pressures, and low but not markedly low cardiac index. CT chest did not show definite evidence for pulmonary sarcoidosis.   Patient returned for follow-up of CHF on 03/25/20.  He was stable symptomatically.  He had generalized fatigue. No significant dyspnea walking on flat ground or up a flight of stairs.  No chest pain.  No orthopnea/PND.  No lightheadedness. Weight was down 3 lbs.   Today he returns to HF clinic for pharmacist medication titration. At last visit with MD, Delene Loll was increased to 49/51 mg BID and KCl was decreased to 20 mg daily. ***     Overall feeling ***. Dizziness, lightheadedness, fatigue:  Chest pain or palpitations:  How is your breathing?: *** SOB: Able to complete all ADLs. Activity level ***  Weight at home pounds. Takes furosemide/torsemide/bumex *** mg *** daily.  LEE PND/Orthopnea  Appetite *** Low-salt diet:    Physical Exam Cost/affordability of meds    -approved to receive Entresto from [employer] on 03/31/20 -no labs today (drawn recently on 04/07/20 after Entresto dose increase) -incr Entresto to 97/103 mg BID, stop KCl (?), recheck BMET 5/9 (earlier?) -metoprolol not at max dose, but HR has been in 50s-60s -f/u with Dr. Aundra Dubin on 5/9    HF Medications: Metoprolol succinate 37.5 mg daily Entresto 49/51 mg BID Spironolactone 25 mg daily Farxiga 10 mg daily    Has the patient been experiencing any side effects to the medications prescribed?  {YES NO:22349}  Does the patient have any problems obtaining medications due to transportation or finances?   {YES NO:22349}  Understanding of regimen: {excellent/good/fair/poor:19665} Understanding of indications: {excellent/good/fair/poor:19665} Potential of compliance: {excellent/good/fair/poor:19665} Patient understands to avoid NSAIDs. Patient understands to avoid decongestants.    Pertinent Lab Values: . Serum creatinine ***, BUN ***, Potassium ***, Sodium ***, BNP ***, Magnesium ***, Digoxin ***   Vital Signs: . Weight: *** (last clinic weight: ***) . Blood pressure: ***  . Heart rate: ***   Assessment/Plan: 1. Chronic systolic CHF: Etiology of cardiomyopathy is uncertain.  EF has been known to be low since 2013, cardiac cath in 2013 showed nonobstructive CAD.  Most recent study was a cardiac MRI in 1/22 showing LV EF 26% with mild LVE, akinesis of the basal to mid anteroseptal wall, RV EF 33%, delayed enhancement showed transmural LGE basal anteroseptal wall, mid-myocardial LGE in the septum, subendocardial to mid myocardial LGE in the  basal anterior, basal inferoseptal, and basal inferior walls.  Most consistent with cardiac sarcoidosis.  No family history of sarcoidosis, no known lung disease, rashes, or joint pain. CT chest in 02/2020 not suggestive of pulmonary sarcoidosis.  RHC/LHC in 02/2020 showed no significant CAD, low filling  pressures, and CI 2.04.  NYHA class II symptoms, he is not volume overloaded on exam.  - Continue Entresto to 49/51 mg BID.*** - Continue spironolactone 25 mg daily.  - Continue metoprolol succinate 37.5 mg daily.  - Continue dapagliflozin 10 mg daily.  - Can decrease KCl to 20 daily.*** - Cardiac PET at [facility] later assessed for cardiac sarcoidosis.  - ICD discussed previously.  He is not CRT candidate with RBBB.  He wanted to wait a while on this, though he was encouraged on placement given myocardial LGE and possible cardiac sarcoidosis.  2. Atrial fibrillation: Paroxysmal.  He is in NSR on amiodarone.  - Continue apixaban.  - Continue amiodarone 200 daily.  Check LFTs/TSH 03/25/20.  He will need a regular eye exam given amiodarone use.  3. LV thrombus: He has been on Eliquis. No thrombus on 01/2020 cMRI.  4. CKD: Stage 3.  BMET checked 04/07/20.    Esmeralda Links (PharmD Candidate 2022)  Audry Riles, PharmD, BCPS, BCCP, CPP Heart Failure Clinic Pharmacist [phone]

## 2020-04-28 ENCOUNTER — Ambulatory Visit (HOSPITAL_COMMUNITY)
Admission: RE | Admit: 2020-04-28 | Discharge: 2020-04-28 | Disposition: A | Discharge status: Home / Self Care | Payer: Medicare PPO | Source: Ambulatory Visit | Attending: Internal Medicine | Admitting: Internal Medicine

## 2020-04-28 ENCOUNTER — Other Ambulatory Visit: Payer: Self-pay

## 2020-04-28 VITALS — BP 92/58 | HR 64 | Wt 212.8 lb

## 2020-04-28 DIAGNOSIS — I2510 Atherosclerotic heart disease of native coronary artery without angina pectoris: Secondary | ICD-10-CM | POA: Insufficient documentation

## 2020-04-28 DIAGNOSIS — Z7901 Long term (current) use of anticoagulants: Secondary | ICD-10-CM | POA: Insufficient documentation

## 2020-04-28 DIAGNOSIS — I240 Acute coronary thrombosis not resulting in myocardial infarction: Secondary | ICD-10-CM | POA: Insufficient documentation

## 2020-04-28 DIAGNOSIS — F17210 Nicotine dependence, cigarettes, uncomplicated: Secondary | ICD-10-CM | POA: Diagnosis not present

## 2020-04-28 DIAGNOSIS — I480 Paroxysmal atrial fibrillation: Secondary | ICD-10-CM | POA: Diagnosis not present

## 2020-04-28 DIAGNOSIS — N183 Chronic kidney disease, stage 3 unspecified: Secondary | ICD-10-CM | POA: Insufficient documentation

## 2020-04-28 DIAGNOSIS — I5022 Chronic systolic (congestive) heart failure: Secondary | ICD-10-CM | POA: Insufficient documentation

## 2020-04-28 MED ORDER — SPIRONOLACTONE 25 MG PO TABS: 25.0000 mg | ORAL_TABLET | Freq: Every day | ORAL | 3 refills | Status: DC

## 2020-04-28 NOTE — Progress Notes (Signed)
PCP: Lucia Gaskins, MD Cardiology: Dr. Harl Bowie HF Cardiology: Dr. Aundra Dubin  HPI:   65 y.o. with long history of cardiomyopathy, paroxysmal atrial fibrillation, and CKD stage 3 referred by Dr. Harl Bowie for evaluation of CHF. 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  At that time, EF was 15%.  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  Since that time, repeat echoes have shown EF < 35%.  Most recent study was a cardiac MRI in 01/2020.  This showed LVEF 26%.  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  Patient has no family history of sarcoidosis.  His father had CHF, he is not sure of etiology.  Patient was found to have an LV thrombus in 04/2019.  Initially on warfarin, now on Eliquis.  He has a history of paroxysmal AF, currently in NSR on amiodarone.  He is still smoking 1-2 cigarettes/day.  He drinks a couple beers/week.   LHC/RHC in 02/2020 showed no significant CAD, low filling pressures, and low but not markedly low cardiac index. CT chest did not show definite evidence for pulmonary sarcoidosis.   Patient returned for follow-up of CHF on 03/25/20.  He was stable symptomatically.  He had generalized fatigue. No significant dyspnea walking on flat ground or up a flight of stairs.  No chest pain.  No orthopnea/PND.  No lightheadedness. Weight was down 3 lbs.   Patient underwent PET scan at [facility] on 03/28/20, which was negative for cardiac sarcoidosis.  Today he returns to HF clinic for pharmacist medication titration. At last visit with MD, Delene Loll was increased to 49/51 mg BID and KCl was decreased to 20 mg daily. Patient is feeling pretty good overall. He still has low energy sometimes, but this has improved slightly over past month. Denies dizziness or lightheadedness. Denies chest pain or palpitations. Denies SOB - patient usually feels tired and rests  before he gets SOB. Weighs around 212 lbs at home. Patient had only one incidence of LEE about 2 weeks ago due to walking all day while at his nephew's football game. This improved with laying down and resolved after 1-2 days. Patient reports good appetite and that he does not add salt to food. He reports taking all of his HF medications.  HF Medications: Metoprolol succinate 37.5 mg daily Entresto 49/51 mg BID Spironolactone 25 mg daily Farxiga 10 mg daily  Has the patient been experiencing any side effects to the medications prescribed?  No  Does the patient have any problems obtaining medications due to transportation or finances?  Yes - Patient has [facility] Medicare. He was approved to receive Entresto from [employer] on 03/31/20 and has received his shipment already. He complains that Eliquis and Wilder Glade are expensive, around $300 each for a 5-month supply. He is currently receiving samples of Farxiga from his PCP. Also obtained patient signatures today to apply for manufacturer's assistance for Eliquis and Farxiga.  Understanding of regimen: excellent Understanding of indications: good Potential of compliance: fair Patient understands to avoid NSAIDs. Patient understands to avoid decongestants.    Pertinent Lab Values: . 04/07/20: Serum creatinine 1.59, BUN 20, Potassium 4.5  Vital Signs: . Weight: 212 lbs (last clinic weight: 213 lbs) . Blood pressure: 92/58  . Heart rate: 64   Assessment/Plan: 1. Chronic systolic CHF: Etiology of cardiomyopathy is uncertain.  EF has been known to be low since 2013, cardiac cath in 2013 showed  nonobstructive CAD.  Most recent study was a cardiac MRI in 1/22 showing LV EF 26% with mild LVE, akinesis of the basal to mid anteroseptal wall, RV EF 33%, delayed enhancement showed transmural LGE basal anteroseptal wall, mid-myocardial LGE in the septum, subendocardial to mid myocardial LGE in the basal anterior, basal inferoseptal, and basal inferior walls.   Most consistent with cardiac sarcoidosis.  No family history of sarcoidosis, no known lung disease, rashes, or joint pain. CT chest in 02/2020 not suggestive of pulmonary sarcoidosis.  RHC/LHC in 02/2020 showed no significant CAD, low filling pressures, and CI 2.04.   - NYHA class II symptoms, euvolemic on exam today.  - Continue metoprolol succinate 37.5 mg daily.  - Continue Entresto 49/51 mg BID. - Continue spironolactone 25 mg daily. Sent in refills. - Continue dapagliflozin 10 mg daily. Will submit patient assistance application. - Continue KCl 20 mEq daily. - Cardiac PET at [facility] negative for cardiac sarcoidosis.  - ICD discussed previously.  He is not CRT candidate with RBBB.  He wanted to wait a while on this. He was encouraged on placement given myocardial LGE and possible cardiac sarcoidosis, although this has now been ruled out.  2. Atrial fibrillation: Paroxysmal.  He has been in NSR on amiodarone.  - Continue Eliquis. Will submit patient assistance application. - Continue amiodarone 200 mg daily.  LFTs/TSH checked 03/25/20.  He will need a regular eye exam given amiodarone use.   3. LV thrombus: He has been on Eliquis. No thrombus on 01/2020 cMRI.   4. CKD: Stage 3.  Scr was 1.59 on 04/07/20.   No medication changes today due to low BP in clinic. Patient will follow up with Dr. Aundra Dubin on 05/26/20.  Esmeralda Links (PharmD Candidate 2022)  Richardine Service, PharmD, [place] PGY2 Cardiology Pharmacy Resident  Audry Riles, PharmD, BCPS, [facility], CPP Heart Failure Clinic Pharmacist [phone]

## 2020-04-28 NOTE — Patient Instructions (Signed)
It was a pleasure seeing you today!  MEDICATIONS: -No medication changes today -Call if you have questions about your medications. -We will submit patient assistance application forms for Eliquis and Farxiga.  -Ask your pharmacy for "Out-of-Pocket Expense Report" and bring to Clinic front desk to give to pharmacy.  NEXT APPOINTMENT: Return to clinic in 1 month with Dr. Aundra Dubin.  In general, to take care of your heart failure: -Limit your fluid intake to 2 Liters (half-gallon) per day.   -Limit your salt intake to ideally 2-3 grams (2000-3000 mg) per day. -Weigh yourself daily and record, and bring that "weight diary" to your next appointment.  (Weight gain of 2-3 pounds in 1 day typically means fluid weight.) -The medications for your heart are to help your heart and help you live longer.   -Please contact us before stopping any of your heart medications.  Call the clinic at [phone] with questions or to reschedule future appointments.

## 2020-04-30 ENCOUNTER — Telehealth (HOSPITAL_COMMUNITY): Payer: Self-pay | Admitting: Pharmacy Technician

## 2020-04-30 NOTE — Telephone Encounter (Signed)
Sent in [employer] and [employer] applications via fax.  Will follow up.

## 2020-05-02 NOTE — Progress Notes (Signed)
Per Dr Aundra Dubin, cardiac PET showed no evidence for active cardiac sarcoidosis. Pt aware at [place] 4/11

## 2020-05-07 ENCOUNTER — Telehealth: Payer: Self-pay

## 2020-05-07 ENCOUNTER — Telehealth (HOSPITAL_COMMUNITY): Payer: Self-pay | Admitting: Pharmacy Technician

## 2020-05-07 MED ORDER — AMIODARONE HCL 200 MG PO TABS: 200.0000 mg | ORAL_TABLET | Freq: Every day | ORAL | 3 refills | Status: DC

## 2020-05-07 NOTE — Telephone Encounter (Signed)
Advanced Heart Failure Patient Advocate Encounter   Patient was approved to receive Farxiga from [employer]  Patient ID: [id number] Effective dates: 05/06/20 through 01/17/21

## 2020-05-07 NOTE — Telephone Encounter (Signed)
Medication refill request for Amiodarone 200 mg tablets approved and sent to [facility].

## 2020-05-26 ENCOUNTER — Other Ambulatory Visit: Payer: Self-pay | Admitting: Student

## 2020-05-26 ENCOUNTER — Encounter (HOSPITAL_COMMUNITY): Payer: Medicare PPO | Admitting: Cardiology

## 2020-05-29 ENCOUNTER — Encounter (HOSPITAL_COMMUNITY): Payer: Medicare PPO | Admitting: Cardiology

## 2020-06-04 ENCOUNTER — Telehealth (HOSPITAL_COMMUNITY): Payer: Self-pay | Admitting: Pharmacy Technician

## 2020-06-04 ENCOUNTER — Other Ambulatory Visit (HOSPITAL_COMMUNITY): Payer: Self-pay

## 2020-06-04 NOTE — Telephone Encounter (Signed)
Advanced Heart Failure Patient Advocate Encounter  The patient called in requesting a status update of Eliquis. He was under the impression he had been approved for assistance.   Called [employer] to check the status, patient has been denied until he can meet an OOP of $940.39. Patient does not think he has spent that much this year, yet. I encouraged him to call his pharmacy and get a copy of the OOP so that he can see how far away from it he is.   The current 30 day co-pay is, $241.68. It looks like his deductible would be met at that point and the 30 day co-pay moving forward should be $47. The patient stated he's been getting samples of the Eliquis not getting it from the pharmacy. I advised him that we could get him samples this month but that we would not be able to supply samples indefinitely.  LOT [id number]  EXP 5/24  Charlann Boxer, CPhT

## 2020-06-05 ENCOUNTER — Ambulatory Visit: Payer: Medicare PPO | Admitting: Cardiology

## 2020-07-31 ENCOUNTER — Other Ambulatory Visit: Payer: Self-pay

## 2020-07-31 ENCOUNTER — Ambulatory Visit (HOSPITAL_COMMUNITY)
Admission: RE | Admit: 2020-07-31 | Discharge: 2020-07-31 | Disposition: A | Discharge status: Home / Self Care | Payer: Medicare PPO | Source: Ambulatory Visit | Attending: Cardiology | Admitting: Cardiology

## 2020-07-31 ENCOUNTER — Encounter (HOSPITAL_COMMUNITY): Payer: Self-pay | Admitting: Cardiology

## 2020-07-31 VITALS — BP 108/69 | HR 68 | Wt 215.2 lb

## 2020-07-31 DIAGNOSIS — F17210 Nicotine dependence, cigarettes, uncomplicated: Secondary | ICD-10-CM | POA: Diagnosis not present

## 2020-07-31 DIAGNOSIS — I5022 Chronic systolic (congestive) heart failure: Secondary | ICD-10-CM | POA: Insufficient documentation

## 2020-07-31 DIAGNOSIS — N183 Chronic kidney disease, stage 3 unspecified: Secondary | ICD-10-CM | POA: Insufficient documentation

## 2020-07-31 DIAGNOSIS — F17290 Nicotine dependence, other tobacco product, uncomplicated: Secondary | ICD-10-CM | POA: Insufficient documentation

## 2020-07-31 DIAGNOSIS — I2510 Atherosclerotic heart disease of native coronary artery without angina pectoris: Secondary | ICD-10-CM | POA: Insufficient documentation

## 2020-07-31 DIAGNOSIS — Z79899 Other long term (current) drug therapy: Secondary | ICD-10-CM | POA: Insufficient documentation

## 2020-07-31 DIAGNOSIS — I480 Paroxysmal atrial fibrillation: Secondary | ICD-10-CM | POA: Diagnosis not present

## 2020-07-31 DIAGNOSIS — Z8249 Family history of ischemic heart disease and other diseases of the circulatory system: Secondary | ICD-10-CM | POA: Insufficient documentation

## 2020-07-31 DIAGNOSIS — Z7901 Long term (current) use of anticoagulants: Secondary | ICD-10-CM | POA: Diagnosis not present

## 2020-07-31 DIAGNOSIS — E1122 Type 2 diabetes mellitus with diabetic chronic kidney disease: Secondary | ICD-10-CM | POA: Diagnosis not present

## 2020-07-31 DIAGNOSIS — Z7984 Long term (current) use of oral hypoglycemic drugs: Secondary | ICD-10-CM | POA: Diagnosis not present

## 2020-07-31 LAB — COMPREHENSIVE METABOLIC PANEL
ALT: 10 U/L (ref 0–44)
AST: 12 U/L — ABNORMAL LOW (ref 15–41)
Albumin: 3.2 g/dL — ABNORMAL LOW (ref 3.5–5.0)
Alkaline Phosphatase: 67 U/L (ref 38–126)
Anion gap: 9 (ref 5–15)
BUN: 35 mg/dL — ABNORMAL HIGH (ref 8–23)
CO2: 27 mmol/L (ref 22–32)
Calcium: 8.9 mg/dL (ref 8.9–10.3)
Chloride: 100 mmol/L (ref 98–111)
Creatinine, Ser: 2.42 mg/dL — ABNORMAL HIGH (ref 0.61–1.24)
GFR, Estimated: 29 mL/min — ABNORMAL LOW (ref >60)
Glucose, Bld: 139 mg/dL — ABNORMAL HIGH (ref 70–99)
Potassium: 3.8 mmol/L (ref 3.5–5.1)
Sodium: 136 mmol/L (ref 135–145)
Total Bilirubin: 0.5 mg/dL (ref 0.3–1.2)
Total Protein: 6.5 g/dL (ref 6.5–8.1)

## 2020-07-31 LAB — LIPID PANEL
Cholesterol: 121 mg/dL (ref 0–200)
HDL: 54 mg/dL (ref >40)
LDL Cholesterol: 48 mg/dL (ref 0–99)
Total CHOL/HDL Ratio: 2.2 RATIO
Triglycerides: 93 mg/dL (ref <150)
VLDL: 19 mg/dL (ref 0–40)

## 2020-07-31 MED ORDER — METOPROLOL SUCCINATE ER 50 MG PO TB24: 50.0000 mg | ORAL_TABLET | Freq: Every day | ORAL | 2 refills | Status: DC

## 2020-07-31 NOTE — Progress Notes (Signed)
Medication Samples have been provided to the patient.  Drug name: Eliquis       Strength: 5 mg        Qty: 4  LOT: [id number]  Exp.Date: 07/24  Dosing instructions: 1 Tablet 2 x a day  The patient has been instructed regarding the correct time, dose, and frequency of taking this medication, including desired effects and most common side effects.   Juanita Laster Jarron Curley 12:41 PM 07/31/2020

## 2020-07-31 NOTE — Progress Notes (Signed)
PCP: Lucia Gaskins, MD Cardiology: Dr. Harl Bowie HF Cardiology: Dr. Aundra Dubin  65 y.o. with long history of cardiomyopathy, paroxysmal atrial fibrillation, and CKD stage 3 was referred by Dr. Harl Bowie for evaluation of CHF. 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 Patient was diagnosed with CHF in 2013.  At that time, EF was 15%.  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  Since that time, repeat echoes have shown EF < 35%.  Most recent study was a cardiac MRI in 1/22.  This showed LV EF 26%.  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  Patient has no family history of sarcoidosis.  His father had CHF, he is not sure of etiology.  Patient was found to have an LV thrombus.  Initially on warfarin, now on Eliquis.  He has a history of paroxysmal AF, currently in NSR on amiodarone.  He is still smoking 1-2 cigarettes/day.  He drinks a couple beers/week.   LHC/RHC in 2/22 showed no significant CAD, low filling pressures, and low but not markedly low cardiac index. CT chest did not show definite evidence for pulmonary sarcoidosis.  Cardiac PET in 3/22 showed no active inflammation, no evidence for active cardiac sarcoidosis.   Patient returns for followup of CHF.  Weight is stable.  He is taking his medications.  He has mild dypsnea/fatigue with stairs.  No dyspnea walking on flat ground.  No lightheadedness.  No chest pain.  No palpitations.    Labs (12/21): K 4.7, creatinine 1.57 Labs (2/22): ACE level normal, K 4.7, creatinine 1.75 Labs (3/22): K 4.5, creatinine 1.59  ECG (personally reviewed): NSR, RBBB, LPFB, inferior Qs  PMH: 1. Hyperlipidemia 2. Atrial fibrillation: Paroxysmal, first diagnosed in 4/21.  3. CKD stage 3 4. H/o NSVT 5. Type 2 diabetes 6. Chronic systolic CHF: Echo in 1914 with EF 15%.  LHC in 2013 with nonobstructive CAD.  - Echo (2015): EF 30-35%.  - Echo (4/21): EF  25-30%, moderate RV dysfunction, LV thrombus.  - Cardiac MRI (1/22): LV EF 26% with mild LVE, akinesis of the basal to mid anteroseptal wall, RV EF 33%, delayed enhancement showed transmural LGE basal anteroseptal wall, mid-myocardial LGE in the septum, subendocardial to mid myocardial LGE in the basal anterior, basal inferoseptal, and basal inferior walls.  Most consistent with cardiac sarcoidosis.  - CT chest (2/22): respiratory bronchiolitis, emphysema, no evidence for pulmonary sarcoidosis.  - LHC/RHC (2/22): No significant CAD; mean RA 3, PA 28/5, mean PCWP 10, CI 2.04 - Cardiac PET (3/22): No active inflammation noted.  7. H/o LV thrombus  Social History   Socioeconomic History   Marital status: Married    Spouse name: Not on file   Number of children: 0   Years of education: Not on file   Highest education level: Not on file  Occupational History   Occupation: disabled  Tobacco Use   Smoking status: Some Days    Packs/day: 0.25    Types: Cigarettes, E-cigarettes    Start date: 10/13/1982    Last attempt to quit: 09/19/2011    Years since quitting: 8.8   Smokeless tobacco: Never   Tobacco comments:    electronic cig for 4 months   Vaping Use   Vaping Use: Never used  Substance and Sexual Activity   Alcohol use: Not Currently    Comment: weekends   Drug use: No   Sexual activity: Not on file  Other Topics Concern  Not on file  Social History Narrative   Patient is right handed.   Patient seldom drinks caffeine.   Social Determinants of Health   Financial Resource Strain: Not on file  Food Insecurity: Not on file  Transportation Needs: Not on file  Physical Activity: Not on file  Stress: Not on file  Social Connections: Not on file  Intimate Partner Violence: Not on file   Family History  Problem Relation Age of Onset   Heart attack Father    Heart failure Father    Cancer Mother        Multiple myeloma   Heart disease Sister    ROS: All systems reviewed  and negative except as per HPI.   Current Outpatient Medications  Medication Sig Dispense Refill   amiodarone (PACERONE) 200 MG tablet Take 1 tablet (200 mg total) by mouth daily. 90 tablet 3   apixaban (ELIQUIS) 5 MG TABS tablet Take 5 mg by mouth 2 (two) times daily.     Blood Glucose Calibration (TRUE METRIX LEVEL 1) Low SOLN      Blood Glucose Monitoring Suppl (TRUE METRIX METER) w/Device KIT      Cholecalciferol (VITAMIN D-3) 125 MCG (5000 UT) TABS Take 5,000 Units by mouth daily.     dapagliflozin propanediol (FARXIGA) 10 MG TABS tablet Take 10 mg by mouth daily.     gabapentin (NEURONTIN) 100 MG capsule Take 200 mg by mouth 3 (three) times daily.     levothyroxine (SYNTHROID) 100 MCG tablet Take 1 tablet (100 mcg total) by mouth daily before breakfast. 90 tablet 3   Magnesium 250 MG TABS Take 250-500 mg by mouth See admin instructions. Take 3 tablets (500 mg) by mouth in the morning & take 1 tablet (250 mg) by mouth in the evening.     metFORMIN (GLUCOPHAGE) 500 MG tablet Take 500 mg by mouth 2 (two) times daily.     Oxycodone HCl 10 MG TABS Take 10 mg by mouth 4 (four) times daily as needed (for pain).      potassium chloride SA (KLOR-CON) 20 MEQ tablet Take 1 tablet (20 mEq total) by mouth daily. 90 tablet 3   sacubitril-valsartan (ENTRESTO) 49-51 MG Take 1 tablet by mouth 2 (two) times daily. 60 tablet 11   simvastatin (ZOCOR) 40 MG tablet Take 1 tablet (40 mg total) by mouth at bedtime. 90 tablet 1   spironolactone (ALDACTONE) 25 MG tablet Take 1 tablet (25 mg total) by mouth daily. 90 tablet 3   TRUE METRIX BLOOD GLUCOSE TEST test strip      TRUEplus Lancets 33G MISC      metoprolol succinate (TOPROL-XL) 50 MG 24 hr tablet Take 1 tablet (50 mg total) by mouth daily. 90 tablet 2   No current facility-administered medications for this encounter.   BP 108/69   Pulse 68   Wt 97.6 kg (215 lb 3.2 oz)   SpO2 94%   BMI 29.19 kg/m  General: NAD Neck: No JVD, no thyromegaly or  thyroid nodule.  Lungs: Clear to auscultation bilaterally with normal respiratory effort. CV: Nondisplaced PMI.  Heart regular S1/S2, no S3/S4, no murmur.  No peripheral edema.  No carotid bruit.  Normal pedal pulses.  Abdomen: Soft, nontender, no hepatosplenomegaly, no distention.  Skin: Intact without lesions or rashes.  Neurologic: Alert and oriented x 3.  Psych: Normal affect. Extremities: No clubbing or cyanosis.  HEENT: Normal.   Assessment/Plan: 1. Chronic systolic CHF: Etiology of cardiomyopathy is uncertain.  EF  has been known to be low since 2013, cardiac cath in 2013 showed nonobstructive CAD.  Most recent study was a cardiac MRI in 1/22 showing LV EF 26% with mild LVE, akinesis of the basal to mid anteroseptal wall, RV EF 33%, delayed enhancement showed transmural LGE basal anteroseptal wall, mid-myocardial LGE in the septum, subendocardial to mid myocardial LGE in the basal anterior, basal inferoseptal, and basal inferior walls.  Most consistent with cardiac sarcoidosis.  No family history of sarcoidosis, no known lung disease, rashes, or joint pain. CT chest in 2/22 not suggestive of pulmonary sarcoidosis.  Finally, cardiac PET in 3/22 did not show evidence for active inflammation.  Therefore, no evidence for active cardiac sarcoidosis (cannot rule out "burned out"/inactive sarcoidosis).  RHC/LHC in 2/22 showed no significant CAD, low filling pressures, and CI 2.04.  NYHA class II symptoms, he is not volume overloaded on exam.  - Continue Entresto 49/51 bid. BMET today.  - Continue spironolactone 25 mg daily.  - Increase Toprol XL to 50 mg daily.   - Continue dapagliflozin 10 mg daily.  - He does not need a diuretic.  - We again discussed ICD.  He is not CRT candidate with RBBB.  He has decided that he does not want an ICD though I again recommended it given persistently low EF and extensive LGE on cMRI.  - Refer for cardiac rehab for CHF 2. Atrial fibrillation: Paroxysmal.  He is in  NSR on amiodarone.  - Continue apixaban.  - Continue amiodarone 200 daily.  Check LFTs today.  He will need a regular eye exam given amiodarone use.  3. LV thrombus: He has been on Eliquis. No thrombus on 1/22 cMRI.  4. CKD: Stage 3.  BMET today.   Followup in 3 wks with HF pharmacist for medication titration, see me in 3 months.    Loralie Champagne 07/31/2020

## 2020-07-31 NOTE — Patient Instructions (Signed)
Increase Toprol to 50 mg daily  Lab work done today we will call you with any abnormal results  Please follow up with our heart failure pharmacist in 3 weeks  You have been referred to cardiac rehab,they will call you for an apoointment  You need to call [facility] for your PCP [phone] to schedule an appointment.  Your physician recommends that you schedule a follow-up appointment in: 3 Months  If you have any questions or concerns before your next appointment please send [place] a message through [employer] or call our office at [phone].    TO LEAVE A MESSAGE FOR THE NURSE SELECT OPTION 2, PLEASE LEAVE A MESSAGE INCLUDING: YOUR NAME DATE OF BIRTH CALL BACK NUMBER REASON FOR CALL**this is important as we prioritize the call backs  YOU WILL RECEIVE A CALL BACK THE SAME DAY AS LONG AS YOU CALL BEFORE 4:00 PM   milAt the [facility], you and your health needs are our priority. As part of our continuing mission to provide you with exceptional heart care, we have created designated Provider Care Teams. These Care Teams include your primary Cardiologist (physician) and Advanced Practice Providers (APPs- Physician Assistants and Nurse Practitioners) who all work together to provide you with the care you need, when you need it.   You may see any of the following providers on your designated Care Team at your next follow up: Dr Glori Bickers Dr Loralie Champagne Dr Patrice Paradise, NP Lyda Jester, [place] Ginnie Smart Audry Riles, PharmD   Please be sure to bring in all your medications bottles to every appointment.

## 2020-08-01 ENCOUNTER — Telehealth (HOSPITAL_COMMUNITY): Payer: Self-pay | Admitting: Cardiology

## 2020-08-01 DIAGNOSIS — I5022 Chronic systolic (congestive) heart failure: Secondary | ICD-10-CM

## 2020-08-01 MED ORDER — ENTRESTO 24-26 MG PO TABS: 1.0000 | ORAL_TABLET | Freq: Two times a day (BID) | ORAL | 11 refills | Status: DC

## 2020-08-01 NOTE — Telephone Encounter (Signed)
Patient called.  Patient aware. Pt voiced understanding Repeat labs 7/22

## 2020-08-01 NOTE — Telephone Encounter (Signed)
-----   Message from Larey Dresser, MD sent at 07/31/2020  9:15 PM EDT ----- Elevated creatinine, decrease Entresto to 24/26 bid with BMET next week.

## 2020-08-07 ENCOUNTER — Other Ambulatory Visit: Payer: Self-pay

## 2020-08-07 ENCOUNTER — Ambulatory Visit (HOSPITAL_COMMUNITY)
Admission: RE | Admit: 2020-08-07 | Discharge: 2020-08-07 | Disposition: A | Discharge status: Home / Self Care | Payer: Medicare PPO | Source: Ambulatory Visit | Attending: Cardiology | Admitting: Cardiology

## 2020-08-07 DIAGNOSIS — I5022 Chronic systolic (congestive) heart failure: Secondary | ICD-10-CM | POA: Diagnosis present

## 2020-08-07 LAB — BASIC METABOLIC PANEL
Anion gap: 9 (ref 5–15)
BUN: 24 mg/dL — ABNORMAL HIGH (ref 8–23)
CO2: 24 mmol/L (ref 22–32)
Calcium: 9.1 mg/dL (ref 8.9–10.3)
Chloride: 103 mmol/L (ref 98–111)
Creatinine, Ser: 1.93 mg/dL — ABNORMAL HIGH (ref 0.61–1.24)
GFR, Estimated: 38 mL/min — ABNORMAL LOW (ref >60)
Glucose, Bld: 171 mg/dL — ABNORMAL HIGH (ref 70–99)
Potassium: 4 mmol/L (ref 3.5–5.1)
Sodium: 136 mmol/L (ref 135–145)

## 2020-09-02 ENCOUNTER — Inpatient Hospital Stay (HOSPITAL_COMMUNITY): Admission: RE | Admit: 2020-09-02 | Payer: Medicare PPO | Source: Ambulatory Visit

## 2020-09-25 ENCOUNTER — Inpatient Hospital Stay (HOSPITAL_COMMUNITY)
Admission: RE | Admit: 2020-09-25 | Discharge: 2020-09-25 | Disposition: A | Discharge status: Home / Self Care | Payer: Medicare PPO | Source: Ambulatory Visit

## 2020-09-25 ENCOUNTER — Other Ambulatory Visit (HOSPITAL_COMMUNITY): Payer: Self-pay

## 2020-10-28 ENCOUNTER — Other Ambulatory Visit: Payer: Self-pay

## 2020-10-28 ENCOUNTER — Other Ambulatory Visit (HOSPITAL_COMMUNITY): Payer: Self-pay | Admitting: *Deleted

## 2020-10-28 ENCOUNTER — Encounter (HOSPITAL_COMMUNITY): Payer: Self-pay | Admitting: Cardiology

## 2020-10-28 ENCOUNTER — Ambulatory Visit (HOSPITAL_COMMUNITY)
Admission: RE | Admit: 2020-10-28 | Discharge: 2020-10-28 | Disposition: A | Discharge status: Home / Self Care | Payer: Medicare PPO | Source: Ambulatory Visit | Attending: Cardiology | Admitting: Cardiology

## 2020-10-28 VITALS — BP 130/70 | HR 66 | Wt 224.2 lb

## 2020-10-28 DIAGNOSIS — Z8249 Family history of ischemic heart disease and other diseases of the circulatory system: Secondary | ICD-10-CM | POA: Insufficient documentation

## 2020-10-28 DIAGNOSIS — Z7901 Long term (current) use of anticoagulants: Secondary | ICD-10-CM | POA: Insufficient documentation

## 2020-10-28 DIAGNOSIS — Z79899 Other long term (current) drug therapy: Secondary | ICD-10-CM | POA: Diagnosis not present

## 2020-10-28 DIAGNOSIS — Z7984 Long term (current) use of oral hypoglycemic drugs: Secondary | ICD-10-CM | POA: Insufficient documentation

## 2020-10-28 DIAGNOSIS — E039 Hypothyroidism, unspecified: Secondary | ICD-10-CM | POA: Insufficient documentation

## 2020-10-28 DIAGNOSIS — I4891 Unspecified atrial fibrillation: Secondary | ICD-10-CM | POA: Diagnosis not present

## 2020-10-28 DIAGNOSIS — I480 Paroxysmal atrial fibrillation: Secondary | ICD-10-CM | POA: Diagnosis not present

## 2020-10-28 DIAGNOSIS — Z86711 Personal history of pulmonary embolism: Secondary | ICD-10-CM | POA: Insufficient documentation

## 2020-10-28 DIAGNOSIS — E1122 Type 2 diabetes mellitus with diabetic chronic kidney disease: Secondary | ICD-10-CM | POA: Insufficient documentation

## 2020-10-28 DIAGNOSIS — R400 Somnolence: Secondary | ICD-10-CM

## 2020-10-28 DIAGNOSIS — N183 Chronic kidney disease, stage 3 unspecified: Secondary | ICD-10-CM | POA: Insufficient documentation

## 2020-10-28 DIAGNOSIS — I5022 Chronic systolic (congestive) heart failure: Secondary | ICD-10-CM | POA: Diagnosis not present

## 2020-10-28 LAB — COMPREHENSIVE METABOLIC PANEL
ALT: 10 U/L (ref 0–44)
AST: 12 U/L — ABNORMAL LOW (ref 15–41)
Albumin: 3.3 g/dL — ABNORMAL LOW (ref 3.5–5.0)
Alkaline Phosphatase: 61 U/L (ref 38–126)
Anion gap: 7 (ref 5–15)
BUN: 19 mg/dL (ref 8–23)
CO2: 24 mmol/L (ref 22–32)
Calcium: 9.1 mg/dL (ref 8.9–10.3)
Chloride: 107 mmol/L (ref 98–111)
Creatinine, Ser: 1.74 mg/dL — ABNORMAL HIGH (ref 0.61–1.24)
GFR, Estimated: 44 mL/min — ABNORMAL LOW (ref >60)
Glucose, Bld: 120 mg/dL — ABNORMAL HIGH (ref 70–99)
Potassium: 4.2 mmol/L (ref 3.5–5.1)
Sodium: 138 mmol/L (ref 135–145)
Total Bilirubin: 0.5 mg/dL (ref 0.3–1.2)
Total Protein: 6.8 g/dL (ref 6.5–8.1)

## 2020-10-28 LAB — TSH: TSH: 11.841 u[IU]/mL — ABNORMAL HIGH (ref 0.350–4.500)

## 2020-10-28 MED ORDER — AMIODARONE HCL 200 MG PO TABS: 100.0000 mg | ORAL_TABLET | Freq: Every day | ORAL | 3 refills | Status: DC

## 2020-10-28 MED ORDER — METOPROLOL SUCCINATE ER 50 MG PO TB24: 50.0000 mg | ORAL_TABLET | Freq: Two times a day (BID) | ORAL | 3 refills | Status: DC

## 2020-10-28 NOTE — Addendum Note (Signed)
Encounter addended by: Larey Dresser, MD on: 10/28/2020 10:14 PM  Actions taken: Clinical Note Signed

## 2020-10-28 NOTE — Patient Instructions (Signed)
EKG done today.  Labs done today. We will contact you only if your labs are abnormal.  INCREASE Metoprolol to 50mg  (1 tablet) by mouth 2 times daily.   DECREASE Amiodarone to 100mg  (1/2 tablet) by mouth daily.   No other medication changes were made. Please continue all current medications as prescribed.  You have been referred to Electrophysiology. They will contact you to schedule an appointment.   Your provider has recommended that you have a home sleep study.  We have provided you with the equipment in our office today. Please download the app and follow the instructions. YOUR PIN NUMBER IS: [id number]. Once you have completed the test you just dispose of the equipment, the information is automatically uploaded to [place] via blue-tooth technology. If your test is positive for sleep apnea and you need a home CPAP machine you will be contacted by Dr Theodosia Blender office [facility]) to set this up.  Your physician recommends that you schedule a follow-up appointment soon for an echo and in 1 month with our APP [facility] here in our office.   If you have any questions or concerns before your next appointment please send [place] a message through [place] or call our office at [phone].    TO LEAVE A MESSAGE FOR THE NURSE SELECT OPTION 2, PLEASE LEAVE A MESSAGE INCLUDING: YOUR NAME DATE OF BIRTH CALL BACK NUMBER REASON FOR CALL**this is important as we prioritize the call backs  YOU WILL RECEIVE A CALL BACK THE SAME DAY AS LONG AS YOU CALL BEFORE 4:00 PM   Do the following things EVERYDAY: Weigh yourself in the morning before breakfast. Write it down and keep it in a log. Take your medicines as prescribed Eat low salt foods--Limit salt (sodium) to 2000 mg per day.  Stay as active as you can everyday Limit all fluids for the day to less than 2 liters   At the [facility], you and your health needs are our priority. As part of our continuing mission to provide you with exceptional  heart care, we have created designated Provider Care Teams. These Care Teams include your primary Cardiologist (physician) and Advanced Practice Providers (APPs- Physician Assistants and Nurse Practitioners) who all work together to provide you with the care you need, when you need it.   You may see any of the following providers on your designated Care Team at your next follow up: Dr Glori Bickers Dr Haynes Kerns, NP Lyda Jester, [place] Audry Riles, PharmD   Please be sure to bring in all your medications bottles to every appointment.

## 2020-10-28 NOTE — Progress Notes (Addendum)
PCP: Lucia Gaskins, MD Cardiology: Dr. Harl Bowie HF Cardiology: Dr. Aundra Dubin  65 y.o. with long history of cardiomyopathy, paroxysmal atrial fibrillation, and CKD stage 3 was referred by Dr. Harl Bowie for evaluation of CHF. 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  At that time, EF was 15%.  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  Since that time, repeat echoes have shown EF < 35%.  Most recent study was a cardiac MRI in 1/22.  This showed LV EF 26%.  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  Patient has no family history of sarcoidosis.  His father had CHF, he is not sure of etiology.  Patient was found to have an LV thrombus.  Initially on warfarin, now on Eliquis.  He has a history of paroxysmal AF, currently in NSR on amiodarone.  He is still smoking 1-2 cigarettes/day.  He drinks a couple beers/week.   LHC/RHC in 2/22 showed no significant CAD, low filling pressures, and low but not markedly low cardiac index. CT chest did not show definite evidence for pulmonary sarcoidosis.  Cardiac PET in 3/22 showed no active inflammation, no evidence for active cardiac sarcoidosis.   Patient returns for followup of CHF.   He has mild dyspnea walking up stairs.  No problems walking on flat ground. Still with poor energy level in general.  He is sleepy during the day and snores at night.  He is in NSR today.  Weight is up about 9 lbs, he says he has not been eating well.    Labs (12/21): K 4.7, creatinine 1.57 Labs (2/22): ACE level normal, K 4.7, creatinine 1.75 Labs (3/22): K 4.5, creatinine 1.59 Labs (7/22): LDL 48, K 4, creatinine 1.93  ECG (personally reviewed): NSR, RBBB, LAFB, old inferior MI  PMH: 1. Hyperlipidemia 2. Atrial fibrillation: Paroxysmal, first diagnosed in 4/21.  3. CKD stage 3 4. H/o NSVT 5. Type 2 diabetes 6. Chronic systolic CHF:  Echo in 4944 with EF 15%.  LHC in 2013 with nonobstructive CAD.  - Echo (2015): EF 30-35%.  - Echo (4/21): EF 25-30%, moderate RV dysfunction, LV thrombus.  - Cardiac MRI (1/22): LV EF 26% with mild LVE, akinesis of the basal to mid anteroseptal wall, RV EF 33%, delayed enhancement showed transmural LGE basal anteroseptal wall, mid-myocardial LGE in the septum, subendocardial to mid myocardial LGE in the basal anterior, basal inferoseptal, and basal inferior walls.  Most consistent with cardiac sarcoidosis.  - CT chest (2/22): respiratory bronchiolitis, emphysema, no evidence for pulmonary sarcoidosis.  - LHC/RHC (2/22): No significant CAD; mean RA 3, PA 28/5, mean PCWP 10, CI 2.04 - Cardiac PET (3/22): No active inflammation noted.  7. H/o LV thrombus  Social History   Socioeconomic History   Marital status: Married    Spouse name: Not on file   Number of children: 0   Years of education: Not on file   Highest education level: Not on file  Occupational History   Occupation: disabled  Tobacco Use   Smoking status: Some Days    Packs/day: 0.25    Types: Cigarettes, E-cigarettes    Start date: 10/13/1982    Last attempt to quit: 09/19/2011    Years since quitting: 9.1   Smokeless tobacco: Never   Tobacco comments:    electronic cig for 4 months   Vaping Use   Vaping Use: Never used  Substance  and Sexual Activity   Alcohol use: Not Currently    Comment: weekends   Drug use: No   Sexual activity: Not on file  Other Topics Concern   Not on file  Social History Narrative   Patient is right handed.   Patient seldom drinks caffeine.   Social Determinants of Health   Financial Resource Strain: Not on file  Food Insecurity: Not on file  Transportation Needs: Not on file  Physical Activity: Not on file  Stress: Not on file  Social Connections: Not on file  Intimate Partner Violence: Not on file   Family History  Problem Relation Age of Onset   Heart attack Father    Heart  failure Father    Cancer Mother        Multiple myeloma   Heart disease Sister    ROS: All systems reviewed and negative except as per HPI.   Current Outpatient Medications  Medication Sig Dispense Refill   apixaban (ELIQUIS) 5 MG TABS tablet Take 5 mg by mouth 2 (two) times daily.     Blood Glucose Calibration (TRUE METRIX LEVEL 1) Low SOLN      Blood Glucose Monitoring Suppl (TRUE METRIX METER) w/Device KIT      Cholecalciferol (VITAMIN D-3) 125 MCG (5000 UT) TABS Take 3 tablets by mouth daily.     dapagliflozin propanediol (FARXIGA) 10 MG TABS tablet Take 10 mg by mouth daily.     gabapentin (NEURONTIN) 100 MG capsule Take 200 mg by mouth 3 (three) times daily.     levothyroxine (SYNTHROID) 112 MCG tablet Take 112 mcg by mouth daily before breakfast.     Magnesium 250 MG TABS Take 250-500 mg by mouth See admin instructions. Take 3 tablets (500 mg) by mouth in the morning & take 1 tablet (250 mg) by mouth in the evening.     metFORMIN (GLUCOPHAGE) 500 MG tablet Take 500 mg by mouth 2 (two) times daily.     Oxycodone HCl 10 MG TABS Take 10 mg by mouth 4 (four) times daily as needed (for pain).      potassium chloride SA (KLOR-CON) 20 MEQ tablet Take 1 tablet (20 mEq total) by mouth daily. 90 tablet 3   sacubitril-valsartan (ENTRESTO) 24-26 MG Take 1 tablet by mouth 2 (two) times daily. 60 tablet 11   simvastatin (ZOCOR) 40 MG tablet Take 1 tablet (40 mg total) by mouth at bedtime. 90 tablet 1   spironolactone (ALDACTONE) 25 MG tablet Take 1 tablet (25 mg total) by mouth daily. 90 tablet 3   TRUE METRIX BLOOD GLUCOSE TEST test strip      TRUEplus Lancets 33G MISC      amiodarone (PACERONE) 200 MG tablet Take 0.5 tablets (100 mg total) by mouth daily. 45 tablet 3   metoprolol succinate (TOPROL-XL) 50 MG 24 hr tablet Take 1 tablet (50 mg total) by mouth 2 (two) times daily. 180 tablet 3   No current facility-administered medications for this encounter.   BP 130/70   Pulse 66   Wt 101.7  kg (224 lb 3.2 oz)   SpO2 98%   BMI 30.41 kg/m  General: NAD Neck: No JVD, no thyromegaly or thyroid nodule.  Lungs: Clear to auscultation bilaterally with normal respiratory effort. CV: Nondisplaced PMI.  Heart regular S1/S2, no S3/S4, no murmur.  No peripheral edema.  No carotid bruit.  Normal pedal pulses.  Abdomen: Soft, nontender, no hepatosplenomegaly, no distention.  Skin: Intact without lesions or rashes.  Neurologic:  Alert and oriented x 3.  Psych: Normal affect. Extremities: No clubbing or cyanosis.  HEENT: Normal.   Assessment/Plan: 1. Chronic systolic CHF: Etiology of cardiomyopathy is uncertain.  EF has been known to be low since 2013, cardiac cath in 2013 showed nonobstructive CAD.  Most recent study was a cardiac MRI in 1/22 showing LV EF 26% with mild LVE, akinesis of the basal to mid anteroseptal wall, RV EF 33%, delayed enhancement showed transmural LGE basal anteroseptal wall, mid-myocardial LGE in the septum, subendocardial to mid myocardial LGE in the basal anterior, basal inferoseptal, and basal inferior walls.  Most consistent with cardiac sarcoidosis.  No family history of sarcoidosis, no known lung disease, rashes, or joint pain. CT chest in 2/22 not suggestive of pulmonary sarcoidosis.  Finally, cardiac PET in 3/22 did not show evidence for active inflammation.  Therefore, no evidence for active cardiac sarcoidosis (cannot rule out "burned out"/inactive sarcoidosis).  RHC/LHC in 2/22 showed no significant CAD, low filling pressures, and CI 2.04.  NYHA class II symptoms, not volume overloaded on exam.  - Continue Entresto 24/26 bid, BMET today.  Dose was decreased from 49/51 bid due to worsening creatinine.   - Continue spironolactone 25 mg daily.  - Increase Toprol XL to 50 mg bid.    - Continue dapagliflozin 10 mg daily.  - He does not need a diuretic.  - We again discussed ICD.  He is not CRT candidate with RBBB.  I again recommended it given persistently low EF and  extensive LGE on cMRI. He will think about it, and I will refer him to EP to discuss both ICD and atrial fibrillation ablation.  - I will arrange for repeat echo.  2. Atrial fibrillation: Paroxysmal.  He is in NSR on amiodarone.  - Continue apixaban.  - Decrease amiodarone to 100 mg daily.  Check LFTs today.  He will need a regular eye exam given amiodarone use.  He has hypothyroidism followed by PCP.  - I will refer to EP for atrial fibrillation ablation consideration.  This would likely allow him to stop amiodarone altogether.  3. LV thrombus: He has been on Eliquis. No thrombus on 1/22 cMRI.  4. CKD: Stage 3.  BMET today.  5. OSA: Strong suspicion for OSA.  I will arrange for home sleep study.   Followup in 1 month with APP for medication titration.     Loralie Champagne 10/28/2020

## 2020-10-28 NOTE — Progress Notes (Signed)
ITAMAR home sleep study given to patient, all instructions explained, and CLOUDPAT registration complete.  

## 2020-11-24 ENCOUNTER — Telehealth (HOSPITAL_COMMUNITY): Payer: Self-pay | Admitting: Surgery

## 2020-11-24 NOTE — Telephone Encounter (Signed)
Patient contacted to remind perform ordered home sleep study.  I left a message to request that it be completed and asked that he call back with any questions or concerns.

## 2020-11-24 NOTE — Progress Notes (Signed)
PCP: [name], NP Cardiology: Dr. Harl Bowie HF Cardiology: Dr. Perrin Smack [name] [name] is a 65 y.o. with long history of cardiomyopathy, paroxysmal atrial fibrillation, and CKD stage 3 was referred by Dr. Harl Bowie for evaluation of CHF. 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 Patient was diagnosed with CHF in 2013.  At that time, EF was 15%.  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  Since that time, repeat echoes have shown EF < 35%.  Most recent study was a cardiac MRI in 1/22.  This showed LV EF 26%.  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  Patient has no family history of sarcoidosis.  His father had CHF, he is not sure of etiology.  Patient was found to have an LV thrombus.  Initially on warfarin, now on Eliquis.  He has a history of paroxysmal AF, currently in NSR on amiodarone.  He is still smoking 1-2 cigarettes/day.  He drinks a couple beers/week.   LHC/RHC in 2/22 showed no significant CAD, low filling pressures, and low but not markedly low cardiac index. CT chest did not show definite evidence for pulmonary sarcoidosis.  Cardiac PET in 3/22 showed no active inflammation, no evidence for active cardiac sarcoidosis.    Stable NYHA II symptoms at follow up 10/22, Toprol increased, amiodarone reduced, and he was referred to EP for ablation & ICD consideration.  Echo today, results pending.  Today he returns for HF follow up. He gets fatigued going up stairs but otherwise no significant exertional dyspnea walking on flat ground. Overall feeling fine. Denies CP, dizziness, palpitations, abnormal bleeding, edema, or PND/Orthopnea. Appetite ok. No fever or chills. Weight at home 213-214 pounds. Taking all medications. He took torsemide once or twice this past week for pedal edema.  Labs (12/21): K 4.7, creatinine 1.57 Labs (2/22): ACE level normal, K 4.7, creatinine 1.75 Labs  (3/22): K 4.5, creatinine 1.59 Labs (7/22): LDL 48, K 4, creatinine 1.93 Labs (10/22): K 4.2, creatinine 1.74  ECG (personally reviewed): None ordered today.  PMH: 1. Hyperlipidemia 2. Atrial fibrillation: Paroxysmal, first diagnosed in 4/21.  3. CKD stage 3 4. H/o NSVT 5. Type 2 diabetes 6. Chronic systolic CHF: Echo in 0300 with EF 15%.  LHC in 2013 with nonobstructive CAD.  - Echo (2015): EF 30-35%.  - Echo (4/21): EF 25-30%, moderate RV dysfunction, LV thrombus.  - Cardiac MRI (1/22): LV EF 26% with mild LVE, akinesis of the basal to mid anteroseptal wall, RV EF 33%, delayed enhancement showed transmural LGE basal anteroseptal wall, mid-myocardial LGE in the septum, subendocardial to mid myocardial LGE in the basal anterior, basal inferoseptal, and basal inferior walls.  Most consistent with cardiac sarcoidosis.  - CT chest (2/22): respiratory bronchiolitis, emphysema, no evidence for pulmonary sarcoidosis.  - LHC/RHC (2/22): No significant CAD; mean RA 3, PA 28/5, mean PCWP 10, CI 2.04 - Cardiac PET (3/22): No active inflammation noted.  7. H/o LV thrombus  Social History   Socioeconomic History   Marital status: Married    Spouse name: Not on file   Number of children: 0   Years of education: Not on file   Highest education level: Not on file  Occupational History   Occupation: disabled  Tobacco Use   Smoking status: Some Days    Packs/day: 0.25    Types: Cigarettes, E-cigarettes    Start date: 10/13/1982    Last attempt to quit: 09/19/2011  Years since quitting: 9.1   Smokeless tobacco: Never   Tobacco comments:    electronic cig for 4 months   Vaping Use   Vaping Use: Never used  Substance and Sexual Activity   Alcohol use: Not Currently    Comment: weekends   Drug use: No   Sexual activity: Not on file  Other Topics Concern   Not on file  Social History Narrative   Patient is right handed.   Patient seldom drinks caffeine.   Social Determinants of Health    Financial Resource Strain: Not on file  Food Insecurity: Not on file  Transportation Needs: Not on file  Physical Activity: Not on file  Stress: Not on file  Social Connections: Not on file  Intimate Partner Violence: Not on file   Family History  Problem Relation Age of Onset   Heart attack Father    Heart failure Father    Cancer Mother        Multiple myeloma   Heart disease Sister    ROS: All systems reviewed and negative except as per HPI.   Current Outpatient Medications  Medication Sig Dispense Refill   amiodarone (PACERONE) 200 MG tablet Take 0.5 tablets (100 mg total) by mouth daily. 45 tablet 3   apixaban (ELIQUIS) 5 MG TABS tablet Take 5 mg by mouth 2 (two) times daily.     Blood Glucose Calibration (TRUE METRIX LEVEL 1) Low SOLN      Blood Glucose Monitoring Suppl (TRUE METRIX METER) w/Device KIT      dapagliflozin propanediol (FARXIGA) 10 MG TABS tablet Take 10 mg by mouth daily.     gabapentin (NEURONTIN) 100 MG capsule Take 200 mg by mouth 3 (three) times daily.     levothyroxine (SYNTHROID) 137 MCG tablet Take 137 mcg by mouth every morning.     Magnesium 250 MG TABS Take 250 mg by mouth daily.     metFORMIN (GLUCOPHAGE) 500 MG tablet Take 500 mg by mouth 2 (two) times daily.     metoprolol succinate (TOPROL-XL) 50 MG 24 hr tablet Take 1 tablet (50 mg total) by mouth 2 (two) times daily. 180 tablet 3   Oxycodone HCl 10 MG TABS Take 10 mg by mouth 4 (four) times daily as needed (for pain).      potassium chloride SA (KLOR-CON) 20 MEQ tablet Take 1 tablet (20 mEq total) by mouth daily. 90 tablet 3   sacubitril-valsartan (ENTRESTO) 24-26 MG Take 1 tablet by mouth 2 (two) times daily. 60 tablet 11   simvastatin (ZOCOR) 40 MG tablet Take 1 tablet (40 mg total) by mouth at bedtime. 90 tablet 1   spironolactone (ALDACTONE) 25 MG tablet Take 1 tablet (25 mg total) by mouth daily. 90 tablet 3   TRUE METRIX BLOOD GLUCOSE TEST test strip      TRUEplus Lancets 33G MISC       No current facility-administered medications for this encounter.   BP 102/64   Pulse (!) 56   Wt 96.7 kg (213 lb 3.2 oz)   SpO2 97%   BMI 28.92 kg/m   Wt Readings from Last 3 Encounters:  11/25/20 96.7 kg (213 lb 3.2 oz)  10/28/20 101.7 kg (224 lb 3.2 oz)  07/31/20 97.6 kg (215 lb 3.2 oz)   General:  NAD. No resp difficulty HEENT: Normal Neck: Supple. No JVD. Carotids 2+ bilat; no bruits. No lymphadenopathy or thryomegaly appreciated. Cor: PMI nondisplaced. Regular rate & rhythm. No rubs, gallops or murmurs. Lungs:  Clear Abdomen: Soft, nontender, nondistended. No hepatosplenomegaly. No bruits or masses. Good bowel sounds. Extremities: No cyanosis, clubbing, rash, edema Neuro: Alert & oriented x 3, cranial nerves grossly intact. Moves all 4 extremities w/o difficulty. Affect pleasant.  Assessment/Plan: 1. Chronic systolic CHF: Etiology of cardiomyopathy is uncertain.  EF has been known to be low since 2013, cardiac cath in 2013 showed nonobstructive CAD.  Most recent study was a cardiac MRI in 1/22 showing LV EF 26% with mild LVE, akinesis of the basal to mid anteroseptal wall, RV EF 33%, delayed enhancement showed transmural LGE basal anteroseptal wall, mid-myocardial LGE in the septum, subendocardial to mid myocardial LGE in the basal anterior, basal inferoseptal, and basal inferior walls.  Most consistent with cardiac sarcoidosis.  No family history of sarcoidosis, no known lung disease, rashes, or joint pain. CT chest in 2/22 not suggestive of pulmonary sarcoidosis.  Finally, cardiac PET in 3/22 did not show evidence for active inflammation.  Therefore, no evidence for active cardiac sarcoidosis (cannot rule out "burned out"/inactive sarcoidosis).  RHC/LHC in 2/22 showed no significant CAD, low filling pressures, and CI 2.04.  NYHA class II symptoms, not volume overloaded on exam today. No BP room to titrate GDMT. - Continue Entresto 24/26 bid, BMET today.  Dose was decreased from  49/51 bid due to worsening creatinine.   - Continue spironolactone 25 mg daily.  - Continue Toprol XL 50 mg bid.    - Continue dapagliflozin 10 mg daily. No GU symptoms. - He does not need a diuretic.  - He is not CRT candidate with RBBB.  We further discussed rationale for ICD recommendation given persistently low EF and extensive LGE on cMRI. He has been referred to EP to discuss both ICD and atrial fibrillation ablation.  - Echo today, results pending. 2. Atrial fibrillation: Paroxysmal.  He is regular on exam today. - Continue apixaban.  - Continue amiodarone 100 mg daily.  LFTs 10/22 ok.  He will need a regular eye exam given amiodarone use.  He has hypothyroidism followed by PCP.  - He has been referred to EP for atrial fibrillation ablation consideration.  This would likely allow him to stop amiodarone altogether.  3. LV thrombus: He has been on Eliquis. No thrombus on 1/22 cMRI.  4. CKD: Stage 3.  BMET today.  5. OSA: Strong suspicion for OSA. Home sleep study arranged. Encouraged him to complete this soon.  Followup in 3 months with Dr. Wynema Birch [facility] FNP 11/25/2020

## 2020-11-25 ENCOUNTER — Ambulatory Visit (HOSPITAL_BASED_OUTPATIENT_CLINIC_OR_DEPARTMENT_OTHER)
Admission: RE | Admit: 2020-11-25 | Discharge: 2020-11-25 | Disposition: A | Discharge status: Home / Self Care | Payer: Medicare PPO | Source: Ambulatory Visit | Attending: Family Medicine | Admitting: Family Medicine

## 2020-11-25 ENCOUNTER — Encounter (HOSPITAL_COMMUNITY): Payer: Self-pay

## 2020-11-25 ENCOUNTER — Other Ambulatory Visit: Payer: Self-pay

## 2020-11-25 ENCOUNTER — Ambulatory Visit (HOSPITAL_COMMUNITY)
Admission: RE | Admit: 2020-11-25 | Discharge: 2020-11-25 | Disposition: A | Discharge status: Home / Self Care | Payer: Medicare PPO | Source: Ambulatory Visit | Attending: Student | Admitting: Student

## 2020-11-25 VITALS — BP 102/64 | HR 56 | Wt 213.2 lb

## 2020-11-25 DIAGNOSIS — I2510 Atherosclerotic heart disease of native coronary artery without angina pectoris: Secondary | ICD-10-CM | POA: Insufficient documentation

## 2020-11-25 DIAGNOSIS — Z86718 Personal history of other venous thrombosis and embolism: Secondary | ICD-10-CM | POA: Insufficient documentation

## 2020-11-25 DIAGNOSIS — R400 Somnolence: Secondary | ICD-10-CM

## 2020-11-25 DIAGNOSIS — R5383 Other fatigue: Secondary | ICD-10-CM | POA: Insufficient documentation

## 2020-11-25 DIAGNOSIS — I5022 Chronic systolic (congestive) heart failure: Secondary | ICD-10-CM | POA: Diagnosis not present

## 2020-11-25 DIAGNOSIS — N1831 Chronic kidney disease, stage 3a: Secondary | ICD-10-CM | POA: Diagnosis not present

## 2020-11-25 DIAGNOSIS — Z7901 Long term (current) use of anticoagulants: Secondary | ICD-10-CM | POA: Insufficient documentation

## 2020-11-25 DIAGNOSIS — I480 Paroxysmal atrial fibrillation: Secondary | ICD-10-CM | POA: Insufficient documentation

## 2020-11-25 DIAGNOSIS — E1122 Type 2 diabetes mellitus with diabetic chronic kidney disease: Secondary | ICD-10-CM | POA: Insufficient documentation

## 2020-11-25 DIAGNOSIS — N183 Chronic kidney disease, stage 3 unspecified: Secondary | ICD-10-CM | POA: Insufficient documentation

## 2020-11-25 DIAGNOSIS — Z09 Encounter for follow-up examination after completed treatment for conditions other than malignant neoplasm: Secondary | ICD-10-CM | POA: Insufficient documentation

## 2020-11-25 DIAGNOSIS — E039 Hypothyroidism, unspecified: Secondary | ICD-10-CM | POA: Insufficient documentation

## 2020-11-25 DIAGNOSIS — I4891 Unspecified atrial fibrillation: Secondary | ICD-10-CM

## 2020-11-25 DIAGNOSIS — I513 Intracardiac thrombosis, not elsewhere classified: Secondary | ICD-10-CM | POA: Diagnosis not present

## 2020-11-25 DIAGNOSIS — Z79899 Other long term (current) drug therapy: Secondary | ICD-10-CM | POA: Diagnosis not present

## 2020-11-25 DIAGNOSIS — F17210 Nicotine dependence, cigarettes, uncomplicated: Secondary | ICD-10-CM | POA: Diagnosis not present

## 2020-11-25 LAB — BASIC METABOLIC PANEL
Anion gap: 9 (ref 5–15)
BUN: 13 mg/dL (ref 8–23)
CO2: 27 mmol/L (ref 22–32)
Calcium: 8.5 mg/dL — ABNORMAL LOW (ref 8.9–10.3)
Chloride: 103 mmol/L (ref 98–111)
Creatinine, Ser: 1.81 mg/dL — ABNORMAL HIGH (ref 0.61–1.24)
GFR, Estimated: 41 mL/min — ABNORMAL LOW (ref >60)
Glucose, Bld: 116 mg/dL — ABNORMAL HIGH (ref 70–99)
Potassium: 3.5 mmol/L (ref 3.5–5.1)
Sodium: 139 mmol/L (ref 135–145)

## 2020-11-25 LAB — ECHOCARDIOGRAM COMPLETE
Area-P 1/2: 2.43 cm2
S' Lateral: 4.2 cm
Single Plane A4C EF: 39.1 %

## 2020-11-25 NOTE — Progress Notes (Signed)
  Echocardiogram 2D Echocardiogram has been performed.  [name] [name] 11/25/2020, 11:40 AM

## 2020-11-25 NOTE — Patient Instructions (Signed)
Labs were done today, if any labs are abnormal the clinic will call you  Your physician recommends that you schedule a follow-up appointment in: 2-3 months  PLEASE do sleep study  At the [facility], you and your health needs are our priority. As part of our continuing mission to provide you with exceptional heart care, we have created designated Provider Care Teams. These Care Teams include your primary Cardiologist (physician) and Advanced Practice Providers (APPs- Physician Assistants and Nurse Practitioners) who all work together to provide you with the care you need, when you need it.   You may see any of the following providers on your designated Care Team at your next follow up: Dr Glori Bickers Dr Haynes Kerns, NP Lyda Jester, [facility] [place], [place] Audry Riles, PharmD   Please be sure to bring in all your medications bottles to every appointment.    If you have any questions or concerns before your next appointment please send [place] a message through [place] or call our office at [phone].    TO LEAVE A MESSAGE FOR THE NURSE SELECT OPTION 2, PLEASE LEAVE A MESSAGE INCLUDING: YOUR NAME DATE OF BIRTH CALL BACK NUMBER REASON FOR CALL**this is important as we prioritize the call backs  YOU WILL RECEIVE A CALL BACK THE SAME DAY AS LONG AS YOU CALL BEFORE 4:00 PM

## 2020-11-27 ENCOUNTER — Institutional Professional Consult (permissible substitution): Payer: Medicare PPO | Admitting: Cardiology

## 2020-12-01 ENCOUNTER — Encounter (INDEPENDENT_AMBULATORY_CARE_PROVIDER_SITE_OTHER): Payer: Medicare PPO | Admitting: Cardiology

## 2020-12-01 DIAGNOSIS — G4733 Obstructive sleep apnea (adult) (pediatric): Secondary | ICD-10-CM | POA: Diagnosis not present

## 2020-12-11 NOTE — Procedures (Signed)
   Sleep Study Report  Patient Information Study Date: 12/01/20 Patient Name: [name] [name] Patient ID: [id number] Birth Date:Dec 19, 1955 Age:65 Gender: Male Referring Physician: Loralie Champagne, MD  TEST DESCRIPTION: Home sleep apnea testing was completed using the WatchPat, a Type 1 device, utilizing peripheral arterial tonometry (PAT), chest movement, actigraphy, pulse oximetry, pulse rate, body position and snore. AHI was calculated with apnea and hypopnea using valid sleep time as the denominator. RDI includes apneas, hypopneas, and RERAs. The data acquired and the scoring of sleep and all associated events were performed in accordance with the recommended standards and specifications as outlined in the AASM Manual for the Scoring of Sleep and Associated Events 2.2.0 (2015).  FINDINGS: 1. Severe Obstructive Sleep Apnea with AHI 36.8/hr. 2. No Central Sleep Apnea with pAHIc 4.4/hr. 3. Oxygen desaturations as low as 78%. 4. Moderate snoring was present. O2 sats were < 88% for 4.2 min. 5. Total sleep time was 4 hrs and 3 min. 6. 13% of total sleep time was spent in REM sleep. 7. Shortened sleep onset latency at 10 min. 8. Prolonged REM sleep onset latency at 195 min. 9. Total awakenings were 22 .  DIAGNOSIS: Severe Obstructive Sleep Apnea (G47.33)  RECOMMENDATIONS: 1. Clinical correlation of these findings is necessary. The decision to treat obstructive sleep apnea (OSA) is usually based on the presence of apnea symptoms or the presence of associated medical conditions such as Hypertension, Congestive Heart Failure, Atrial Fibrillation or Obesity. The most common symptoms of OSA are snoring, gasping for breath while sleeping, daytime sleepiness and fatigue.  2. Initiating apnea therapy is recommended given the presence of symptoms and/or associated conditions. Recommend proceeding with one of the following:   a. Auto-CPAP therapy with a pressure range of 5-20cm H2O.   b.  An oral appliance (OA) that can be obtained from certain dentists with expertise in sleep medicine. These are primarily of use in non-obese patients with mild and moderate disease.   c. An ENT consultation which may be useful to look for specific causes of obstruction and possible treatment options.   d. If patient is intolerant to PAP therapy, consider referral to ENT for evaluation for hypoglossal nerve stimulator.  3. Close follow-up is necessary to ensure success with CPAP or oral appliance therapy for maximum benefit .  4. A follow-up oximetry study on CPAP is recommended to assess the adequacy of therapy and determine the need for supplemental oxygen or the potential need for Bi-level therapy. An arterial blood gas to determine the adequacy of baseline ventilation and oxygenation should also be considered.  5. Healthy sleep recommendations include: adequate nightly sleep (normal 7-9 hrs/night), avoidance of caffeine after noon and alcohol near bedtime, and maintaining a sleep environment that is cool, dark and quiet.  6. Weight loss for overweight patients is recommended. Even modest amounts of weight loss can significantly improve the severity of sleep apnea.  7. Snoring recommendations include: weight loss where appropriate, side sleeping, and avoidance of alcohol before bed.  8. Operation of motor vehicle should be avoided when sleepy.  Signature: Electronically Signed: 12/12/20 Fransico Him, MD; [facility]; [place], American Board of Sleep Medicine

## 2020-12-14 ENCOUNTER — Ambulatory Visit: Payer: Medicare PPO

## 2020-12-14 DIAGNOSIS — R400 Somnolence: Secondary | ICD-10-CM

## 2020-12-16 ENCOUNTER — Telehealth: Payer: Self-pay | Admitting: *Deleted

## 2020-12-16 DIAGNOSIS — G4733 Obstructive sleep apnea (adult) (pediatric): Secondary | ICD-10-CM

## 2020-12-16 NOTE — Telephone Encounter (Signed)
-----   Message from Sueanne Margarita, MD sent at 12/11/2020 10:10 PM EST ----- Please let patient know that they have sleep apnea.  Recommend therapeutic CPAP titration for treatment of patient's sleep disordered breathing.  If unable to perform an in lab titration then initiate ResMed auto CPAP from 4 to 15cm H2O with heated humidity and mask of choice and overnight pulse ox on CPAP.

## 2020-12-17 ENCOUNTER — Other Ambulatory Visit: Payer: Self-pay | Admitting: Cardiology

## 2020-12-17 NOTE — Telephone Encounter (Signed)
Prescription refill request for Eliquis received. Indication: Atrial fib Last office visit:118/22  J Milford FNP Scr: 1.81 on 11/25/20 Age: 65 Weight: 96.7kg  Based on above findings Eliquis 5mg  twice daily is the appropriate dose.  Refill approved.

## 2021-01-02 ENCOUNTER — Institutional Professional Consult (permissible substitution): Payer: Medicare PPO | Admitting: Cardiology

## 2021-01-02 NOTE — Progress Notes (Deleted)
° °Electrophysiology Office Note ° ° °Date:  01/02/2021  ° °ID:  Nataniel A Lawal, DOB 06/13/1955, MRN [id number] ° °PCP:  Stowe, Shanna, NP  °Cardiologist:  Branch, McLean °Primary Electrophysiologist:  Will Martin Camnitz, MD   ° °Chief Complaint: AF °  °History of Present Illness: °Alban A Puleo is a 65 y.o. male who is being seen today for the evaluation of AF at the request of McLean, Dalton S, MD. Presenting today for electrophysiology evaluation. ° °She has a history significant for atrial fibrillation, cardiomyopathy, CKD stage III.  She was diagnosed with heart failure in 2013.  Her ejection fraction was 15% at that time.  Catheterization in 2013 showed no obstructive coronary disease.  Since that time, she has continued to have a reduced ejection fraction.  Cardiac MRI January 2022 showed an ejection fraction of 26% with extensive delayed enhancement suggestive of infiltrative disease instead of coronary artery disease, possibly sarcoidosis.  She was found to have an LV thrombus at the time and was started on anticoagulation.  She also has a history of atrial fibrillation and is on amiodarone.  Cardiac PET March 2022 showed no active inflammation and no evidence of sarcoidosis. ° °Today, he denies*** symptoms of palpitations, chest pain, shortness of breath, orthopnea, PND, lower extremity edema, claudication, dizziness, presyncope, syncope, bleeding, or neurologic sequela. The patient is tolerating medications without difficulties.  ° ° °Past Medical History:  °Diagnosis Date  ° Anxiety   ° CHF (congestive heart failure) (HCC)   ° a. EF 15% in 2013 with cath showing normal cors b. EF 30-35% by repeat echo in 2015  ° Chronic systolic heart failure (HCC)   ° Diabetes mellitus   ° Fluid retention in legs   ° Hypertension   ° °Past Surgical History:  °Procedure Laterality Date  ° COLONOSCOPY  01/04/2012  ° Procedure: COLONOSCOPY;  Surgeon: Sandi L Fields, MD;  Location: [facility]ITE;  Service: Endoscopy;   Laterality: N/A;  1:30 PM  ° HERNIA REPAIR    ° LEFT AND RIGHT HEART CATHETERIZATION WITH CORONARY ANGIOGRAM N/A 07/09/2011  ° Procedure: LEFT AND RIGHT HEART CATHETERIZATION WITH CORONARY ANGIOGRAM;  Surgeon: Peter M Jordan, MD;  Location: [facility] CATH LAB;  Service: Cardiovascular;  Laterality: N/A;  ° RIGHT/LEFT HEART CATH AND CORONARY ANGIOGRAPHY N/A 02/25/2020  ° Procedure: RIGHT/LEFT HEART CATH AND CORONARY ANGIOGRAPHY;  Surgeon: McLean, Dalton S, MD;  Location: [facility] INVASIVE CV LAB;  Service: Cardiovascular;  Laterality: N/A;  ° ° ° °Current Outpatient Medications  °Medication Sig Dispense Refill  ° amiodarone (PACERONE) 200 MG tablet Take 0.5 tablets (100 mg total) by mouth daily. 45 tablet 3  ° apixaban (ELIQUIS) 5 MG TABS tablet TAKE ONE TABLET BY MOUTH 2 TIMES A DAY 60 tablet 5  ° Blood Glucose Calibration (TRUE METRIX LEVEL 1) Low SOLN     ° Blood Glucose Monitoring Suppl (TRUE METRIX METER) w/Device KIT     ° dapagliflozin propanediol (FARXIGA) 10 MG TABS tablet Take 10 mg by mouth daily.    ° gabapentin (NEURONTIN) 100 MG capsule Take 200 mg by mouth 3 (three) times daily.    ° levothyroxine (SYNTHROID) 137 MCG tablet Take 137 mcg by mouth every morning.    ° Magnesium 250 MG TABS Take 250 mg by mouth daily.    ° metFORMIN (GLUCOPHAGE) 500 MG tablet Take 500 mg by mouth 2 (two) times daily.    ° metoprolol succinate (TOPROL-XL) 50 MG 24 hr tablet Take 1 tablet (50 mg   50 mg total) by mouth 2 (two) times daily. 180 tablet 3   Oxycodone HCl 10 MG TABS Take 10 mg by mouth 4 (four) times daily as needed (for pain).      potassium chloride SA (KLOR-CON) 20 MEQ tablet Take 1 tablet (20 mEq total) by mouth daily. 90 tablet 3   sacubitril-valsartan (ENTRESTO) 24-26 MG Take 1 tablet by mouth 2 (two) times daily. 60 tablet 11   simvastatin (ZOCOR) 40 MG tablet Take 1 tablet (40 mg total) by mouth at bedtime. 90 tablet 1   spironolactone (ALDACTONE) 25 MG tablet Take 1 tablet (25 mg total) by mouth daily. 90 tablet 3    TRUE METRIX BLOOD GLUCOSE TEST test strip      TRUEplus Lancets 33G MISC      No current facility-administered medications for this visit.    Allergies:   Patient has no known allergies.   Social History:  The patient  reports that he has been smoking cigarettes and e-cigarettes. He started smoking about 38 years ago. He has been smoking an average of .25 packs per day. He has never used smokeless tobacco. He reports that he does not currently use alcohol. He reports that he does not use drugs.   Family History:  The patient's family history includes Cancer in his mother; Heart attack in his father; Heart disease in his sister; Heart failure in his father.    ROS:  Please see the history of present illness.   Otherwise, review of systems is positive for none.   All other systems are reviewed and negative.    PHYSICAL EXAM: VS:  There were no vitals taken for this visit. , BMI There is no height or weight on file to calculate BMI. GEN: Well nourished, well developed, in no acute distress  HEENT: normal  Neck: no JVD, carotid bruits, or masses Cardiac: ***RRR; no murmurs, rubs, or gallops,no edema  Respiratory:  clear to auscultation bilaterally, normal work of breathing GI: soft, nontender, nondistended, + BS MS: no deformity or atrophy  Skin: warm and dry Neuro:  Strength and sensation are intact Psych: euthymic mood, full affect  EKG:  EKG {ACTION; IS/IS [id number]} ordered today. Personal review of the ekg ordered *** shows ***  Recent Labs: 02/14/2020: B Natriuretic Peptide 116.8; Platelets 224 02/25/2020: Hemoglobin 13.3; Hemoglobin 13.3 10/28/2020: ALT 10; TSH 11.841 11/25/2020: BUN 13; Creatinine, Ser 1.81; Potassium 3.5; Sodium 139    Lipid Panel     Component Value Date/Time   CHOL 121 07/31/2020 1222   TRIG 93 07/31/2020 1222   HDL 54 07/31/2020 1222   CHOLHDL 2.2 07/31/2020 1222   VLDL 19 07/31/2020 1222   LDLCALC 48 07/31/2020 1222     Wt Readings from Last  3 Encounters:  11/25/20 213 lb 3.2 oz (96.7 kg)  10/28/20 224 lb 3.2 oz (101.7 kg)  07/31/20 215 lb 3.2 oz (97.6 kg)      Other studies Reviewed: Additional studies/ records that were reviewed today include: TTE 11/25/20  Review of the above records today demonstrates:   1. Left ventricular ejection fraction, by estimation, is 25 to 30%. The  left ventricle has severely decreased function. The left ventricle  demonstrates global hypokinesis. The left ventricular internal cavity size  was mildly dilated. There is moderate  left ventricular hypertrophy. Left ventricular diastolic parameters are  consistent with Grade I diastolic dysfunction (impaired relaxation).   2. Right ventricular systolic function is normal. The right ventricular  size is normal.  There is normal pulmonary artery systolic pressure.   3. The mitral valve is normal in structure. No evidence of mitral valve  regurgitation.   4. The aortic valve is normal in structure. Aortic valve regurgitation is  not visualized. No aortic stenosis is present.    ASSESSMENT AND PLAN:  1.  Paroxysmal atrial fibrillation: Currently on Eliquis for 5 mg twice daily, amiodarone 100 mg daily.  CHA2DS2-VASc of at least 2.  High risk medication monitoring for amiodarone.***  2.  Chronic systolic heart failure due to nonischemic cardiomyopathy: Currently on Entresto 24/26 mg twice daily, Aldactone 25 mg daily, Toprol-XL 50 mg twice daily, Farxiga 10 mg daily.***  3.  CKD stage III: Has remained stable.  Plan per heart failure.  4.  Snoring: Significant concern for obstructive sleep apnea.  Sleep study pending.  4.  LV thrombus: Currently on Eliquis.  No thrombus on MRI 02/07/2020.    Current medicines are reviewed at length with the patient today.   The patient {ACTIONS; HAS/DOES NOT HAVE:19233} concerns regarding his medicines.  The following changes were made today:  {NONE DEFAULTED:18576}  Labs/ tests ordered today include: *** No  orders of the defined types were placed in this encounter.    Disposition:   FU with Emiliana Blaize {gen number 4-13:244010} {Days to years:10300}  Signed, Dhiya Smits Meredith Leeds, MD  01/02/2021 7:49 AM     [facility] [address] [place] [address] [phone] (office) [phone] (fax)

## 2021-01-04 ENCOUNTER — Other Ambulatory Visit: Payer: Self-pay | Admitting: Cardiology

## 2021-01-05 ENCOUNTER — Telehealth: Payer: Self-pay

## 2021-01-05 NOTE — Telephone Encounter (Signed)
Letter has been sent to patient instructing them to call us if they are still interested in completing their sleep study. If we have not received a response from the patient within 30 days of this notice, the order will be cancelled and they will need to discuss the need for a sleep study at their next office visit.  ° °

## 2021-01-05 NOTE — Telephone Encounter (Signed)
The patient has been notified of the result. Left detailed message on voicemail  and informed patient to call back with questions. Marolyn Hammock, CMA 01/05/2021 6:01 PM    Titration to schedule NO PA req.

## 2021-01-07 ENCOUNTER — Emergency Department (HOSPITAL_COMMUNITY): Payer: Medicare PPO

## 2021-01-07 ENCOUNTER — Other Ambulatory Visit: Payer: Self-pay

## 2021-01-07 ENCOUNTER — Encounter (HOSPITAL_COMMUNITY): Payer: Self-pay | Admitting: Emergency Medicine

## 2021-01-07 ENCOUNTER — Telehealth: Payer: Self-pay

## 2021-01-07 ENCOUNTER — Emergency Department (HOSPITAL_COMMUNITY)
Admission: EM | Admit: 2021-01-07 | Discharge: 2021-01-07 | Disposition: A | Discharge status: Home / Self Care | Payer: Medicare PPO | Attending: Emergency Medicine | Admitting: Emergency Medicine

## 2021-01-07 DIAGNOSIS — N1831 Chronic kidney disease, stage 3a: Secondary | ICD-10-CM | POA: Diagnosis not present

## 2021-01-07 DIAGNOSIS — Z955 Presence of coronary angioplasty implant and graft: Secondary | ICD-10-CM | POA: Insufficient documentation

## 2021-01-07 DIAGNOSIS — F17210 Nicotine dependence, cigarettes, uncomplicated: Secondary | ICD-10-CM | POA: Diagnosis not present

## 2021-01-07 DIAGNOSIS — W109XXA Fall (on) (from) unspecified stairs and steps, initial encounter: Secondary | ICD-10-CM | POA: Insufficient documentation

## 2021-01-07 DIAGNOSIS — I130 Hypertensive heart and chronic kidney disease with heart failure and stage 1 through stage 4 chronic kidney disease, or unspecified chronic kidney disease: Secondary | ICD-10-CM | POA: Diagnosis not present

## 2021-01-07 DIAGNOSIS — Z7984 Long term (current) use of oral hypoglycemic drugs: Secondary | ICD-10-CM | POA: Insufficient documentation

## 2021-01-07 DIAGNOSIS — I5023 Acute on chronic systolic (congestive) heart failure: Secondary | ICD-10-CM | POA: Diagnosis not present

## 2021-01-07 DIAGNOSIS — Z79899 Other long term (current) drug therapy: Secondary | ICD-10-CM | POA: Diagnosis not present

## 2021-01-07 DIAGNOSIS — S20212A Contusion of left front wall of thorax, initial encounter: Secondary | ICD-10-CM

## 2021-01-07 DIAGNOSIS — W19XXXA Unspecified fall, initial encounter: Secondary | ICD-10-CM

## 2021-01-07 DIAGNOSIS — E1122 Type 2 diabetes mellitus with diabetic chronic kidney disease: Secondary | ICD-10-CM | POA: Diagnosis not present

## 2021-01-07 DIAGNOSIS — Z7901 Long term (current) use of anticoagulants: Secondary | ICD-10-CM | POA: Diagnosis not present

## 2021-01-07 DIAGNOSIS — S299XXA Unspecified injury of thorax, initial encounter: Secondary | ICD-10-CM | POA: Diagnosis present

## 2021-01-07 MED ORDER — LIDOCAINE 5 % EX PTCH: 1.0000 | MEDICATED_PATCH | CUTANEOUS | 0 refills | Status: DC

## 2021-01-07 MED ORDER — OXYCODONE-ACETAMINOPHEN 5-325 MG PO TABS
1.0000 | ORAL_TABLET | Freq: Once | ORAL | Status: AC
  Administered 2021-01-07: 10:00:00 1 via ORAL
  Filled 2021-01-07: qty 1

## 2021-01-07 MED ORDER — OXYCODONE-ACETAMINOPHEN 5-325 MG PO TABS: 1.0000 | ORAL_TABLET | Freq: Four times a day (QID) | ORAL | 0 refills | Status: DC | PRN

## 2021-01-07 NOTE — ED Notes (Signed)
Patient to X ray at this time.

## 2021-01-07 NOTE — Telephone Encounter (Signed)
Pt is scheduled for cpap titration on 02/20/21 at [facility]. Pt made aware of appointment.

## 2021-01-07 NOTE — ED Triage Notes (Signed)
Pt to the ED after a fall 3 days ago complaining of left rib pain.

## 2021-01-07 NOTE — Discharge Instructions (Signed)
You likely have suffered a broken rib from your fall.  I recommend that you use a small pillow or folded towel held firmly to your side to cough, sneeze and when taking deep breaths.  Please try to take deep breaths several times throughout the day to help keep your lungs clear.  If your insurance does not cover the prescribed patches, you may buy Salonpas 4% lidocaine patches over-the-counter without a prescription and use them as directed.  Follow-up with your primary care provider return to the emergency department for any new or worsening symptoms.

## 2021-01-07 NOTE — ED Provider Notes (Signed)
[facility] EMERGENCY DEPARTMENT Provider Note   CSN: [id number] Arrival date & time: 01/07/21  0102     History Chief Complaint  Patient presents with   [name] [name]    [name] [name] is a 65 y.o. male.   Fall Associated symptoms include chest pain (left chest wall pain). Pertinent negatives include no abdominal pain, no headaches and no shortness of breath.      [name] [name] is a 65 y.o. male with past medical history of CHF, type II DM, and HTN who presents to the Emergency Department complaining of injury sustained from a mechanical fall that occurred 3 days ago.  He states that his foot slipped and he fell down some steps landing on his left chest.  He describes sharp stabbing type pain to his left chest that is associated with certain movements, coughing, sneezing, and laughing.  Pain resolves when at rest.  He denies any shortness of breath, fever or chills.  No neck arm or jaw pain.  He has taken over-the-counter pain relievers with minimal relief.  No other injuries from the fall, specifically denies head injury, LOC, abdominal pain, neck or back pain.   Past Medical History:  Diagnosis Date   Anxiety    CHF (congestive heart failure) ([place])    a. EF 15% in 2013 with cath showing normal cors b. EF 30-35% by repeat echo in 7253   Chronic systolic heart failure ([place])    Diabetes mellitus    Fluid retention in legs    Hypertension     Patient Active Problem List   Diagnosis Date Noted   Acute respiratory failure with hypoxia ([place]) 04/25/2019   Acute on chronic systolic (congestive) heart failure ([place]) 04/23/2019   Chronic pain 04/23/2019   Chronic renal failure, stage 3a ([place]) 04/23/2019   Generalized anxiety disorder 08/14/2014   Diabetes mellitus type 2, uncontrolled 08/14/2014   Hyperlipidemia 08/14/2014   Onychomycosis 08/14/2014   Hypertrophic toenail 08/14/2014   Tinea versicolor 08/14/2014   Hepatomegaly 08/14/2014   Pain in joint, upper arm 03/06/2014    Cardiomyopathy, dilated, nonischemic (HCC) 66/44/0347   Chronic systolic heart failure ([place]) 07/08/2011   Type 2 diabetes with nephropathy ([place]) 07/08/2011   Hypertension 07/08/2011    Past Surgical History:  Procedure Laterality Date   COLONOSCOPY  01/04/2012   Procedure: COLONOSCOPY;  Surgeon: Danie Binder, MD;  Location: [facility] END[facility] SUITE;  Service: Endoscopy;  Laterality: N/A;  1:30 PM   HERNIA REPAIR     LEFT AND RIGHT HEART CATHETERIZATION WITH CORONARY ANGIOGRAM N/A 07/09/2011   Procedure: LEFT AND RIGHT HEART CATHETERIZATION WITH CORONARY ANGIOGRAM;  Surgeon: Peter M Martinique, MD;  Location: [facility] CATH LAB;  Service: Cardiovascular;  Laterality: N/A;   RIGHT/LEFT HEART CATH AND CORONARY ANGIOGRAPHY N/A 02/25/2020   Procedure: RIGHT/LEFT HEART CATH AND CORONARY ANGIOGRAPHY;  Surgeon: Larey Dresser, MD;  Location: [place] CV LAB;  Service: Cardiovascular;  Laterality: N/A;       Family History  Problem Relation Age of Onset   Heart attack Father    Heart failure Father    Cancer Mother        Multiple myeloma   Heart disease Sister     Social History   Tobacco Use   Smoking status: Some Days    Packs/day: 0.25    Types: Cigarettes, E-cigarettes    Start date: 10/13/1982    Last attempt to quit: 09/19/2011    Years since quitting: 9.3   Smokeless  tobacco: Never   Tobacco comments:    electronic cig for 4 months   Vaping Use   Vaping Use: Never used  Substance Use Topics   Alcohol use: Not Currently    Comment: weekends   Drug use: No    Home Medications Prior to Admission medications   Medication Sig Start Date End Date Taking? Authorizing Provider  amiodarone (PACERONE) 200 MG tablet TAKE 1 TABLET EVERY DAY 01/05/21   Larey Dresser, MD  apixaban (ELIQUIS) 5 MG TABS tablet TAKE ONE TABLET BY MOUTH 2 TIMES A DAY 12/17/20   Arnoldo Lenis, MD  Blood Glucose Calibration (TRUE METRIX LEVEL 1) Low SOLN  11/15/19   [provider]  Blood Glucose  Monitoring Suppl (TRUE METRIX METER) w/Device KIT  11/15/19   [provider]  dapagliflozin propanediol (FARXIGA) 10 MG TABS tablet Take 10 mg by mouth daily.    [provider]  gabapentin (NEURONTIN) 100 MG capsule Take 200 mg by mouth 3 (three) times daily. 11/12/19   [provider]  levothyroxine (SYNTHROID) 137 MCG tablet Take 137 mcg by mouth every morning. 11/09/20   [provider]  Magnesium 250 MG TABS Take 250 mg by mouth daily.    [provider]  metFORMIN (GLUCOPHAGE) 500 MG tablet Take 500 mg by mouth 2 (two) times daily. 04/20/19   [provider]  metoprolol succinate (TOPROL-XL) 50 MG 24 hr tablet Take 1 tablet (50 mg total) by mouth 2 (two) times daily. 10/28/20   Larey Dresser, MD  Oxycodone HCl 10 MG TABS Take 10 mg by mouth 4 (four) times daily as needed (for pain).  04/11/19   [provider]  potassium chloride SA (KLOR-CON) 20 MEQ tablet Take 1 tablet (20 mEq total) by mouth daily. 03/25/20   Larey Dresser, MD  sacubitril-valsartan (ENTRESTO) 24-26 MG Take 1 tablet by mouth 2 (two) times daily. 08/01/20   Larey Dresser, MD  simvastatin (ZOCOR) 40 MG tablet Take 1 tablet (40 mg total) by mouth at bedtime. 08/15/14   Tysinger, Camelia Eng, PA-C  spironolactone (ALDACTONE) 25 MG tablet Take 1 tablet (25 mg total) by mouth daily. 04/28/20   Larey Dresser, MD  TRUE METRIX BLOOD GLUCOSE TEST test strip  12/26/19   [provider]  TRUEplus Lancets 33G [place]  11/15/19   [provider]    Allergies    Patient has no known allergies.  Review of Systems   Review of Systems  Constitutional:  Negative for chills, fatigue and fever.  Respiratory:  Negative for cough, shortness of breath and wheezing.   Cardiovascular:  Positive for chest pain (left chest wall pain). Negative for palpitations.  Gastrointestinal:  Negative for abdominal pain, diarrhea, nausea and vomiting.  Genitourinary:  Negative  for dysuria, flank pain and hematuria.  Musculoskeletal:  Negative for arthralgias, back pain, myalgias, neck pain and neck stiffness.  Skin:  Negative for rash.  Neurological:  Negative for dizziness, weakness, numbness and headaches.  Hematological:  Does not bruise/bleed easily.  Psychiatric/Behavioral:  Negative for confusion.    Physical Exam Updated Vital Signs BP 130/77    Pulse 67    Temp 99 F (37.2 C) (Oral)    Resp 17    Ht 6' (1.829 m)    Wt 96.7 kg    SpO2 93%    BMI 28.91 kg/m   Physical Exam Vitals and nursing note reviewed.  Constitutional:      General:  He is not in acute distress.    Appearance: Normal appearance. He is not ill-appearing.  Cardiovascular:     Rate and Rhythm: Normal rate and regular rhythm.     Pulses: Normal pulses.  Pulmonary:     Effort: Pulmonary effort is normal. No respiratory distress.     Breath sounds: Normal breath sounds. No wheezing.     Comments: Focal tenderness palpation of the left mid anterior and lateral chest wall.  No bony deformities or crepitus noted. Patient is splinting this area with cough Chest:     Chest wall: Tenderness present.  Abdominal:     General: There is no distension.     Palpations: Abdomen is soft.     Tenderness: There is no abdominal tenderness. There is no guarding.  Musculoskeletal:        General: Normal range of motion.     Cervical back: Full passive range of motion without pain and normal range of motion. No tenderness.  Skin:    General: Skin is warm.     Capillary Refill: Capillary refill takes less than 2 seconds.     Findings: No erythema or rash.  Neurological:     General: No focal deficit present.     Mental Status: He is alert.     Sensory: No sensory deficit.     Motor: No weakness.    ED Results / Procedures / Treatments   Labs (all labs ordered are listed, but only abnormal results are displayed) Labs Reviewed - No data to display  EKG EKG  Interpretation  Date/Time:  Wednesday January 07 2021 10:17:24 EST Ventricular Rate:  121 PR Interval:    QRS Duration: 140 QT Interval:  300 QTC Calculation: 414 R Axis:   -84 Text Interpretation: Sinus rhythm  low amplitude p-waves Paired ventricular premature complexes Right bundle branch block Inferior infarct, old Confirmed by Carmin Muskrat [phone]) on 01/07/2021 10:29:02 AM  Radiology DG Ribs Unilateral W/Chest Left  Result Date: 01/07/2021 CLINICAL DATA:  A 65 year old male presents for evaluation of LEFT chest wall pain following fall. EXAM: LEFT RIBS AND CHEST - 3+ VIEW COMPARISON:  July 02, 2019. FINDINGS: Trachea midline. Cardiomediastinal contours and hilar structures are normal. Lungs are clear.  No pneumothorax. There is no sign of displaced rib fracture. IMPRESSION: No sign of displaced rib fracture. No pneumothorax. No acute cardiopulmonary disease. Electronically Signed   By: Zetta Bills M.D.   On: 01/07/2021 10:19    Procedures Procedures   Medications Ordered in ED Medications  oxyCODONE-acetaminophen (PERCOCET/ROXICET) 5-325 MG per tablet 1 tablet (has no administration in time range)    ED Course  I have reviewed the triage vital signs and the nursing notes.  Pertinent labs & imaging results that were available during my care of the patient were reviewed by me and considered in my medical decision making (see chart for details).    MDM Rules/Calculators/A&P                          Patient here for evaluation of injury sustained from mechanical fall that occurred 3 days ago.  He was having pain of his anterior and lateral left chest that is associated with cough, deep breathing and certain movements.  Symptoms resolved when at rest.  On exam, patient well-appearing nontoxic.  Vital signs reassuring.  No hypoxia, tachycardia or tachypnea.  He does have focal tenderness of the anterior and lateral mid chest wall.  No abrasions or ecchymosis.  Clinically, I  suspect he has a rib fracture.  Doubtful of pneumothorax.  Differential would also include ACS, pneumothorax and pneumonia.  On recheck, patient reports feeling better after oral pain medication.  EKG without acute ischemic change.  X-ray of chest and left rib without evidence of pneumothorax or acute fracture.  Clinically, I suspect he has an occult rib fracture given exam findings.  Discussed findings with patient and through shared decision making, he is agreeable to close outpatient follow-up and short course of pain medication.  Will provide prescription for lidocaine patch as well.  He appears appropriate for discharge home, return precautions were discussed.     Final Clinical Impression(s) / ED Diagnoses Final diagnoses:  Contusion of rib on left side, initial encounter  Fall, initial encounter    Rx / DC Orders ED Discharge Orders     None        Bufford Lope 01/07/21 1910    Carmin Muskrat, MD 01/08/21 1302

## 2021-01-08 MED FILL — Oxycodone w/ Acetaminophen Tab 5-325 MG: ORAL | Qty: 6 | Status: AC

## 2021-01-15 NOTE — Telephone Encounter (Signed)
Patient is scheduled for CPAP Titration on 02-21-20. Patient understands his titration study will be done at [facility] sleep lab. Patient understands he will receive a letter in a week or so detailing appointment, date, time, and location.

## 2021-02-03 ENCOUNTER — Institutional Professional Consult (permissible substitution): Payer: Medicare PPO | Admitting: Cardiology

## 2021-02-03 NOTE — Progress Notes (Deleted)
Electrophysiology Office Note   Date:  02/03/2021   ID:  [name] [name], DOB August 31, 1955, MRN [id number]  PCP:  [name], NP  Cardiologist:  Aundra Dubin Primary Electrophysiologist:  Numan Zylstra Meredith Leeds, MD    Chief Complaint: AF   History of Present Illness: [name] [name] is a 66 y.o. male who is being seen today for the evaluation of AF at the request of Larey Dresser, MD. Presenting today for electrophysiology evaluation.  He has a longstanding history of cardiomyopathy, paroxysmal atrial fibrillation, CKD stage III.  He is a retired Administrator.  He was diagnosed with heart failure in 2013.  His ejection fraction was 15% at the time.  Left heart catheterization showed nonobstructive coronary artery disease.  Cardiac MRI January 2022 showed an ejection fraction of 26%.  He had extensive delayed enhancement suggestive of infiltrative disease, possibly sarcoidosis.  He was found to have an LV thrombus and is now on Eliquis.  Today, he denies*** symptoms of palpitations, chest pain, shortness of breath, orthopnea, PND, lower extremity edema, claudication, dizziness, presyncope, syncope, bleeding, or neurologic sequela. The patient is tolerating medications without difficulties.    Past Medical History:  Diagnosis Date   Anxiety    CHF (congestive heart failure) ([place])    a. EF 15% in 2013 with cath showing normal cors b. EF 30-35% by repeat echo in 3570   Chronic systolic heart failure ([place])    Diabetes mellitus    Fluid retention in legs    Hypertension    Past Surgical History:  Procedure Laterality Date   COLONOSCOPY  01/04/2012   Procedure: COLONOSCOPY;  Surgeon: Danie Binder, MD;  Location: [facility];  Service: Endoscopy;  Laterality: N/A;  1:30 PM   HERNIA REPAIR     LEFT AND RIGHT HEART CATHETERIZATION WITH CORONARY ANGIOGRAM N/A 07/09/2011   Procedure: LEFT AND RIGHT HEART CATHETERIZATION WITH CORONARY ANGIOGRAM;  Surgeon: Peter M Martinique, MD;   Location: [facility] CATH LAB;  Service: Cardiovascular;  Laterality: N/A;   RIGHT/LEFT HEART CATH AND CORONARY ANGIOGRAPHY N/A 02/25/2020   Procedure: RIGHT/LEFT HEART CATH AND CORONARY ANGIOGRAPHY;  Surgeon: Larey Dresser, MD;  Location: [place] CV LAB;  Service: Cardiovascular;  Laterality: N/A;     Current Outpatient Medications  Medication Sig Dispense Refill   amiodarone (PACERONE) 200 MG tablet TAKE 1 TABLET EVERY DAY 90 tablet 0   apixaban (ELIQUIS) 5 MG TABS tablet TAKE ONE TABLET BY MOUTH 2 TIMES A DAY 60 tablet 5   Blood Glucose Calibration (TRUE METRIX LEVEL 1) Low SOLN      Blood Glucose Monitoring Suppl (TRUE METRIX METER) w/Device KIT      dapagliflozin propanediol (FARXIGA) 10 MG TABS tablet Take 10 mg by mouth daily.     gabapentin (NEURONTIN) 100 MG capsule Take 200 mg by mouth 3 (three) times daily.     levothyroxine (SYNTHROID) 137 MCG tablet Take 137 mcg by mouth every morning.     lidocaine (LIDODERM) 5 % Place 1 patch onto the skin daily. Remove & Discard patch within 12 hours or as directed by MD 30 patch 0   Magnesium 250 MG TABS Take 250 mg by mouth daily.     metFORMIN (GLUCOPHAGE) 500 MG tablet Take 500 mg by mouth 2 (two) times daily.     metoprolol succinate (TOPROL-XL) 50 MG 24 hr tablet Take 1 tablet (50 mg total) by mouth 2 (two) times daily. 180 tablet 3   Oxycodone HCl 10  MG TABS Take 10 mg by mouth 4 (four) times daily as needed (for pain).      oxyCODONE-acetaminophen (PERCOCET/ROXICET) 5-325 MG tablet Take 1 tablet by mouth every 6 (six) hours as needed for severe pain. 6 tablet 0   potassium chloride SA (KLOR-CON) 20 MEQ tablet Take 1 tablet (20 mEq total) by mouth daily. 90 tablet 3   sacubitril-valsartan (ENTRESTO) 24-26 MG Take 1 tablet by mouth 2 (two) times daily. 60 tablet 11   simvastatin (ZOCOR) 40 MG tablet Take 1 tablet (40 mg total) by mouth at bedtime. 90 tablet 1   spironolactone (ALDACTONE) 25 MG tablet Take 1 tablet (25 mg total) by mouth  daily. 90 tablet 3   TRUE METRIX BLOOD GLUCOSE TEST test strip      TRUEplus Lancets 33G MISC      No current facility-administered medications for this visit.    Allergies:   Patient has no known allergies.   Social History:  The patient  reports that he has been smoking cigarettes and e-cigarettes. He started smoking about 38 years ago. He has been smoking an average of .25 packs per day. He has never used smokeless tobacco. He reports that he does not currently use alcohol. He reports that he does not use drugs.   Family History:  The patient's family history includes Cancer in his mother; Heart attack in his father; Heart disease in his sister; Heart failure in his father.    ROS:  Please see the history of present illness.   Otherwise, review of systems is positive for none.   All other systems are reviewed and negative.    PHYSICAL EXAM: VS:  There were no vitals taken for this visit. , BMI There is no height or weight on file to calculate BMI. GEN: Well nourished, well developed, in no acute distress  HEENT: normal  Neck: no JVD, carotid bruits, or masses Cardiac: ***RRR; no murmurs, rubs, or gallops,no edema  Respiratory:  clear to auscultation bilaterally, normal work of breathing GI: soft, nontender, nondistended, + BS MS: no deformity or atrophy  Skin: warm and dry Neuro:  Strength and sensation are intact Psych: euthymic mood, full affect  EKG:  EKG {ACTION; IS/IS [id number]} ordered today. Personal review of the ekg ordered *** shows ***  Recent Labs: 02/14/2020: B Natriuretic Peptide 116.8; Platelets 224 02/25/2020: Hemoglobin 13.3; Hemoglobin 13.3 10/28/2020: ALT 10; TSH 11.841 11/25/2020: BUN 13; Creatinine, Ser 1.81; Potassium 3.5; Sodium 139    Lipid Panel     Component Value Date/Time   CHOL 121 07/31/2020 1222   TRIG 93 07/31/2020 1222   HDL 54 07/31/2020 1222   CHOLHDL 2.2 07/31/2020 1222   VLDL 19 07/31/2020 1222   LDLCALC 48 07/31/2020 1222      Wt Readings from Last 3 Encounters:  01/07/21 213 lb 3 oz (96.7 kg)  11/25/20 213 lb 3.2 oz (96.7 kg)  10/28/20 224 lb 3.2 oz (101.7 kg)      Other studies Reviewed: Additional studies/ records that were reviewed today include: TTE 11/25/20  Review of the above records today demonstrates:   1. Left ventricular ejection fraction, by estimation, is 25 to 30%. The  left ventricle has severely decreased function. The left ventricle  demonstrates global hypokinesis. The left ventricular internal cavity size  was mildly dilated. There is moderate  left ventricular hypertrophy. Left ventricular diastolic parameters are  consistent with Grade I diastolic dysfunction (impaired relaxation).   2. Right ventricular systolic function is normal.  The right ventricular  size is normal. There is normal pulmonary artery systolic pressure.   3. The mitral valve is normal in structure. No evidence of mitral valve  regurgitation.   4. The aortic valve is normal in structure. Aortic valve regurgitation is  not visualized. No aortic stenosis is present.   CMRI 02/05/20 1. Severe left ventricular systolic dysfunction with mild LV chamber enlargement, LVEF 26%. Severe global hypokinesis with akinesis of the basal and mid anteroseptum. Wall thinning in the basal anteroseptum. No LV thrombus.   2. Transmural delayed myocardial enhancement in the basal anteroseptum with associated myocardial edema.   3. There is a dense, midmyocardial stripe of delayed enhancement extending from the base to the apex in the ventricular septum. Subendocardial and mid-myocardial delayed enhancement in the basal anterior, inferoseptal and inferior walls. Mid-inferior wall has mid myocardial and epicardial delayed enhancement.   4.  Grossly normal first pass perfusion.   5. Moderate right ventricular systolic dysfunction with normal RV chamber size, RVEF 33%. No RV delayed enhancement.   Delayed myocardial  enhancement in a non-coronary distribution with associated myocardial edema and focal wall thinning and regional wall motion abnormality is most consistent with granulomatous disease such as cardiac sarcoidosis. Myocardial edema suggests possible active inflammation.   ASSESSMENT AND PLAN:  1.  Paroxysmal atrial fibrillation: Currently on Eliquis and amiodarone 100 mg daily.  CHA2DS2-VASc of 2.***  2.  Chronic systolic heart failure: Likely nonischemic.  Persistently low ejection fraction.  Currently on Aldactone 25 mg daily, Toprol-XL 50 mg daily, Farxiga 10 mg daily, Entresto 24/26 mg twice daily.  His ejection fraction has been persistently low.  He would thus benefit from ICD therapy.  ***  3.  LV thrombus: Currently on Eliquis.  No thrombus noted on cardiac MRI  4.  Stage III CKD: Plan per primary cardiology  5.  Obstructive sleep apnea: Found to be severe.  CPAP compliance strongly encouraged.  6.  Secondary hypercoagulable state: Due to atrial fibrillation as above.  Continue Eliquis.  Current medicines are reviewed at length with the patient today.   The patient {ACTIONS; HAS/DOES NOT HAVE:19233} concerns regarding his medicines.  The following changes were made today:  {NONE DEFAULTED:18576}  Labs/ tests ordered today include: *** No orders of the defined types were placed in this encounter.    Disposition:   FU with Ikaika Showers {gen number [id number]} {Days to years:10300}  Signed, Sheralee Qazi Meredith Leeds, MD  02/03/2021 8:50 AM     [facility] [address] [place] [address] [phone] (office) [phone] (fax)

## 2021-02-11 ENCOUNTER — Encounter: Payer: Self-pay | Admitting: Cardiology

## 2021-02-20 ENCOUNTER — Encounter: Payer: Medicare PPO | Admitting: Cardiology

## 2021-02-24 ENCOUNTER — Encounter (HOSPITAL_COMMUNITY): Payer: Medicare PPO | Admitting: Cardiology

## 2021-03-10 ENCOUNTER — Telehealth (HOSPITAL_COMMUNITY): Payer: Self-pay | Admitting: Pharmacy Technician

## 2021-03-10 ENCOUNTER — Other Ambulatory Visit (HOSPITAL_COMMUNITY): Payer: Self-pay

## 2021-03-10 NOTE — Telephone Encounter (Signed)
Advanced Heart Failure Patient Advocate Encounter  Received notification from [employer] that it is time to renew patient assistance for [place]. Looks like [place] assistance is expired as well. Called and left the patient a message to return the call. The PAN HF grant is open right now. Would apply for that instead of manufacturer assistance at this time.   Charlann Boxer, CPhT

## 2021-03-24 ENCOUNTER — Telehealth (HOSPITAL_COMMUNITY): Payer: Self-pay | Admitting: Pharmacy Technician

## 2021-03-24 NOTE — Telephone Encounter (Signed)
Advanced Heart Failure Patient Advocate Encounter ? ?Patient called and left message requesting help with assistance of medications. Called patient back and left vm to return my call. ? ?Charlann Boxer, CPhT ? ?

## 2021-06-01 ENCOUNTER — Other Ambulatory Visit (HOSPITAL_COMMUNITY): Payer: Self-pay | Admitting: Cardiology

## 2021-06-30 ENCOUNTER — Other Ambulatory Visit: Payer: Self-pay | Admitting: Cardiology

## 2021-06-30 NOTE — Telephone Encounter (Signed)
Prescription refill request for Eliquis received. Indication: PAF Last office visit: 11/25/20  Rudi Heap FNP Scr: 1.81 on 11/25/20 Age: 66 Weight: 96.7kg  Based on above findings Eliquis '5mg'$  twice daily is the appropriate dose.  Refill approved.

## 2021-08-15 IMAGING — CT CT CHEST HIGH RESOLUTION W/O CM
2 of 6 series · 16 of 34 positions shown, 19 images · non-contrast
Comparison: None.

CLINICAL DATA: Cardiac sarcoidosis suspected, evaluate for
pulmonary sarcoidosis

EXAM:
CT CHEST WITHOUT CONTRAST
TECHNIQUE: Multidetector CT imaging of the chest was performed following the
standard protocol without intravenous contrast. High resolution
imaging of the lungs, as well as inspiratory and expiratory imaging,
was performed.

[Series 3: thins · axial · 0.77mm/px · z∈[+1135,+1407]mm · 10 of 524 slices shown, 13 images]
[im 53/524  mediastinal]
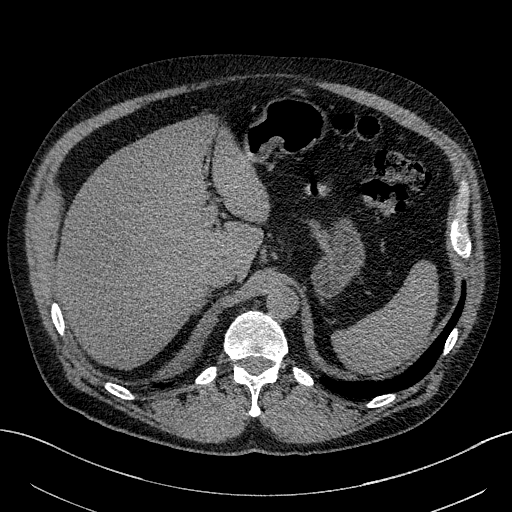
[im 53/524  lung]
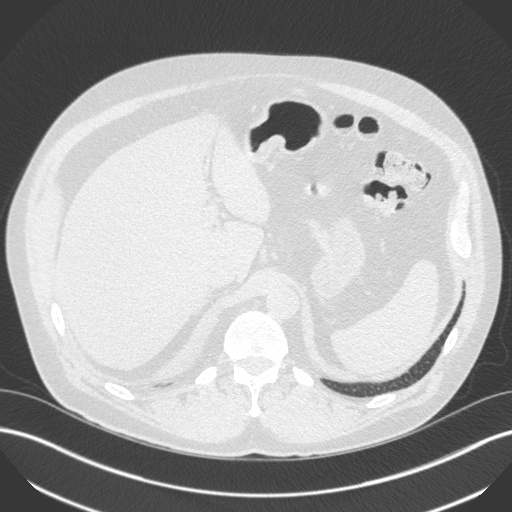
[im 105/524  lung]
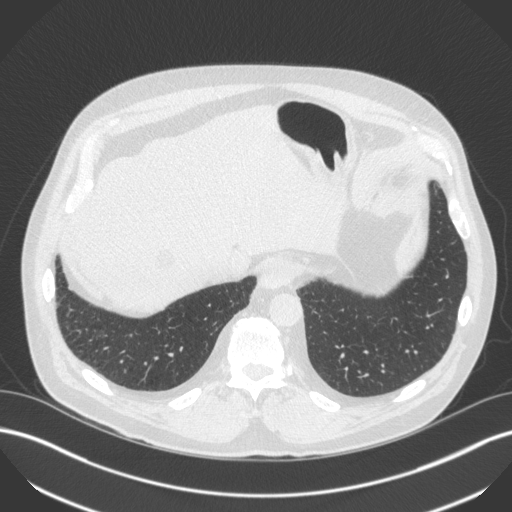
[im 157/524  lung]
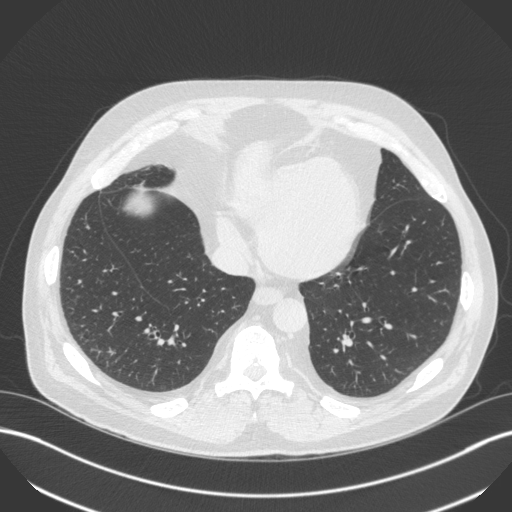
[im 210/524  lung]
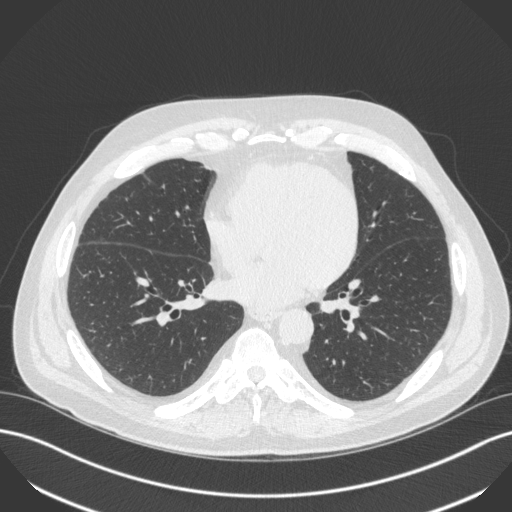
[im 248/524  mediastinal]
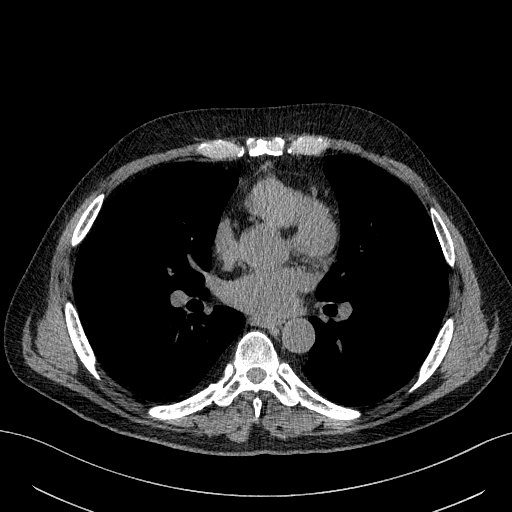
[im 248/524  lung]
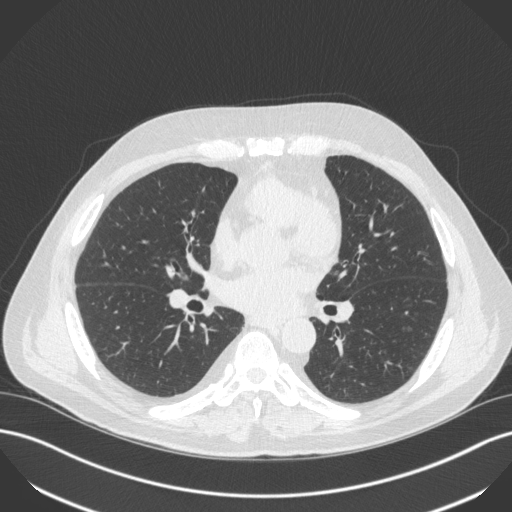
[im 262/524  lung]
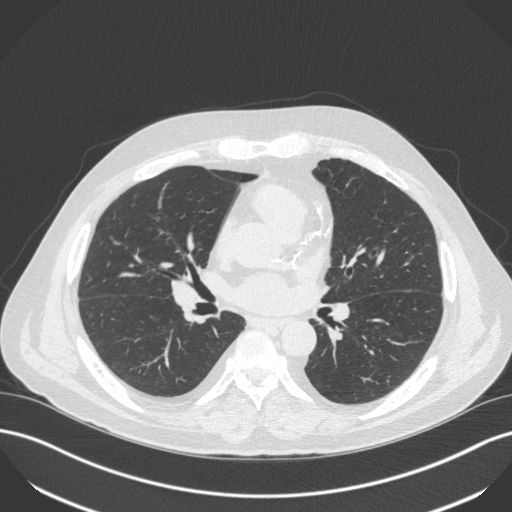
[im 314/524  lung]
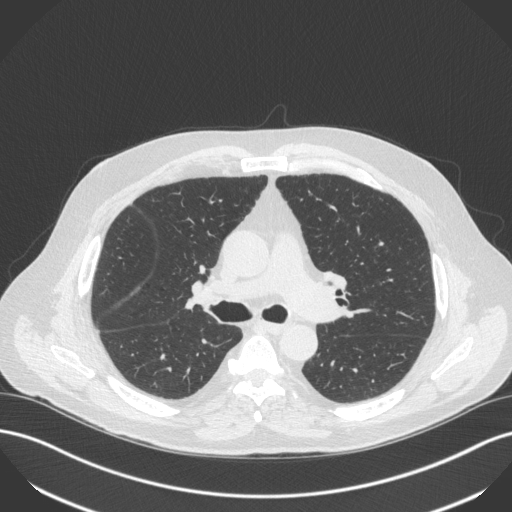
[im 367/524  lung]
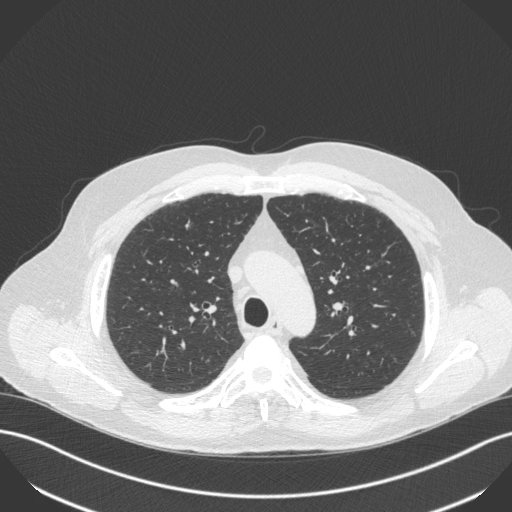
[im 419/524  mediastinal]
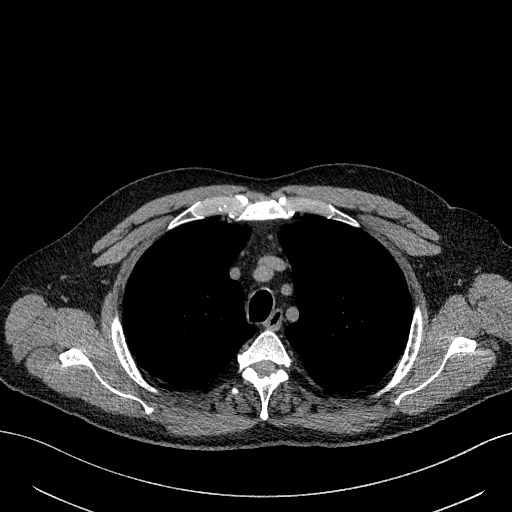
[im 419/524  lung]
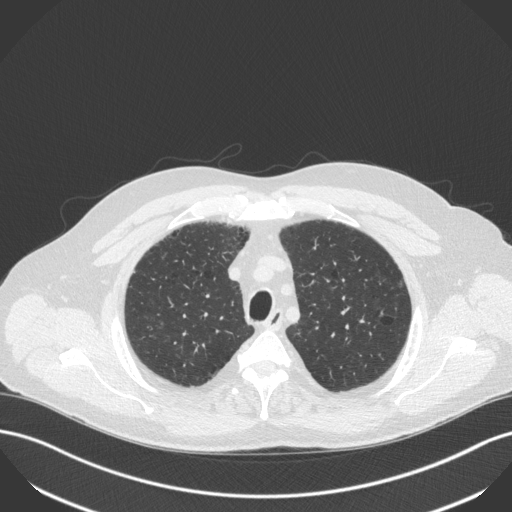
[im 471/524  lung]
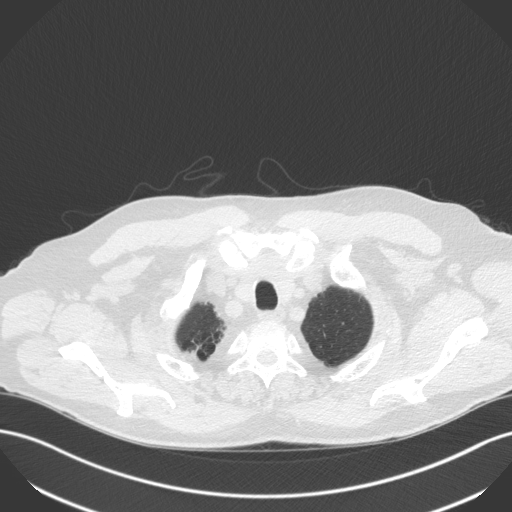

[Series 9: high res thins · axial · 0.76mm/px · z∈[+1150,+1344]mm · 6 of 341 slices shown]
[im 49/341  lung]
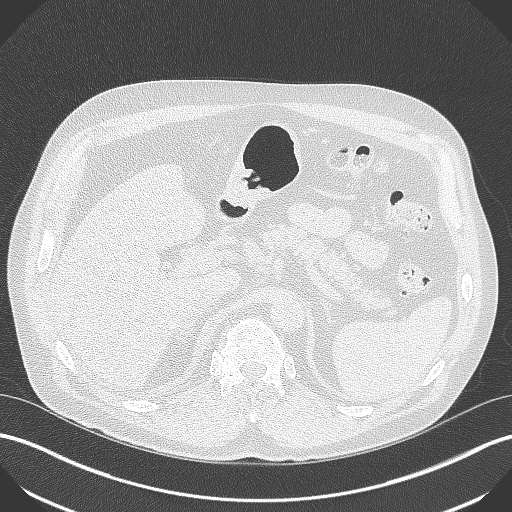
[im 98/341  lung]
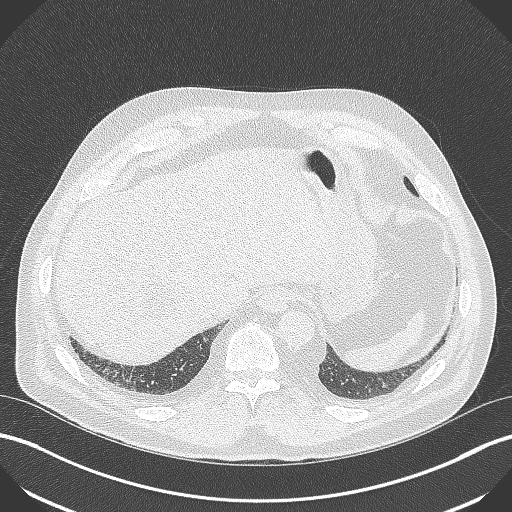
[im 146/341  lung]
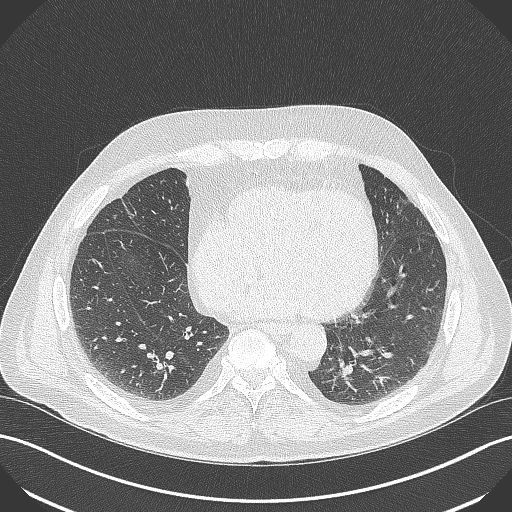
[im 161/341  lung]
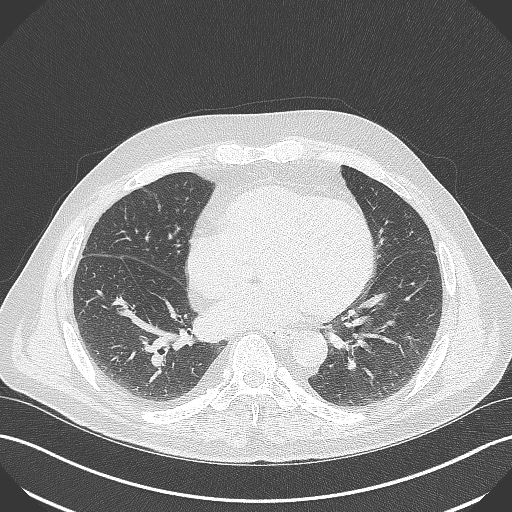
[im 195/341  lung]
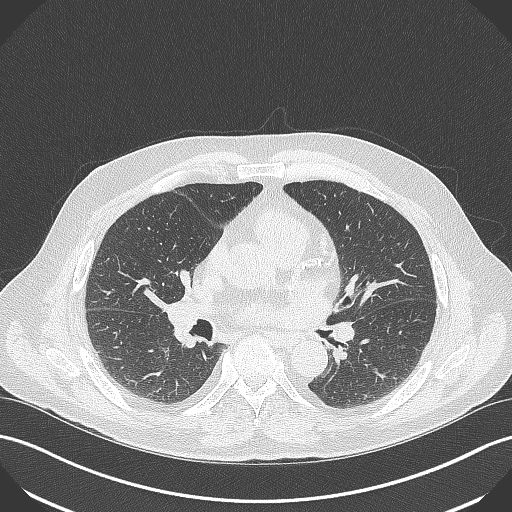
[im 243/341  lung]
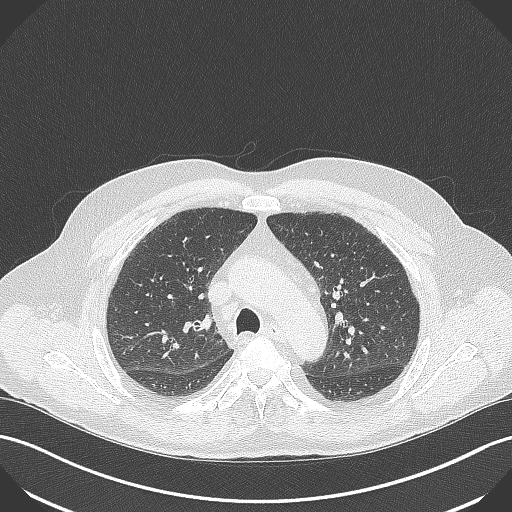

[16 of 34 positions shown; findings below may reference images not displayed]

FINDINGS: Cardiovascular: Three-vessel coronary artery calcifications. Normal
heart size. No pericardial effusion.

Mediastinum/Nodes: No enlarged mediastinal, hilar, or axillary lymph
nodes. Thyroid gland, trachea, and esophagus demonstrate no
significant findings.

Lungs/Pleura: Mild centrilobular and paraseptal emphysema. Diffuse
bilateral bronchial wall thickening. Background of very fine
centrilobular nodularity in the lung apices. No significant air
trapping on expiratory phase imaging. No pleural effusion or
pneumothorax.

Upper Abdomen: No acute abnormality.

Musculoskeletal: No chest wall mass or suspicious bone lesions
identified.
IMPRESSION: 1. There are no specific findings of pulmonary sarcoidosis. There is
a background of very fine centrilobular nodularity in the lung
apices, very likely smoking-related respiratory bronchiolitis,
without specific features such as perilymphatic distribution or
fibrosis to suggest pulmonary sarcoidosis.
2. Diffuse bilateral bronchial wall thickening, consistent with
nonspecific infectious or inflammatory bronchitis.
3. Emphysema.
4. Coronary artery disease.

Emphysema ([id number]).

## 2021-08-24 ENCOUNTER — Other Ambulatory Visit: Payer: Self-pay | Admitting: Cardiology

## 2021-09-14 ENCOUNTER — Emergency Department (HOSPITAL_COMMUNITY): Payer: Medicare HMO

## 2021-09-14 ENCOUNTER — Emergency Department (HOSPITAL_COMMUNITY)
Admission: EM | Admit: 2021-09-14 | Discharge: 2021-09-14 | Disposition: A | Discharge status: Home / Self Care | Payer: Medicare HMO | Attending: Emergency Medicine | Admitting: Emergency Medicine

## 2021-09-14 DIAGNOSIS — I509 Heart failure, unspecified: Secondary | ICD-10-CM

## 2021-09-14 DIAGNOSIS — Z7901 Long term (current) use of anticoagulants: Secondary | ICD-10-CM | POA: Insufficient documentation

## 2021-09-14 DIAGNOSIS — I110 Hypertensive heart disease with heart failure: Secondary | ICD-10-CM | POA: Insufficient documentation

## 2021-09-14 DIAGNOSIS — R079 Chest pain, unspecified: Secondary | ICD-10-CM | POA: Diagnosis present

## 2021-09-14 LAB — BASIC METABOLIC PANEL
Anion gap: 8 (ref 5–15)
BUN: 25 mg/dL — ABNORMAL HIGH (ref 8–23)
CO2: 22 mmol/L (ref 22–32)
Calcium: 8.1 mg/dL — ABNORMAL LOW (ref 8.9–10.3)
Chloride: 107 mmol/L (ref 98–111)
Creatinine, Ser: 2.04 mg/dL — ABNORMAL HIGH (ref 0.61–1.24)
GFR, Estimated: 36 mL/min — ABNORMAL LOW (ref >60)
Glucose, Bld: 133 mg/dL — ABNORMAL HIGH (ref 70–99)
Potassium: 3.6 mmol/L (ref 3.5–5.1)
Sodium: 137 mmol/L (ref 135–145)

## 2021-09-14 LAB — CBC
HCT: 35.4 % — ABNORMAL LOW (ref 39.0–52.0)
Hemoglobin: 11.7 g/dL — ABNORMAL LOW (ref 13.0–17.0)
MCH: 35.8 pg — ABNORMAL HIGH (ref 26.0–34.0)
MCHC: 33.1 g/dL (ref 30.0–36.0)
MCV: 108.3 fL — ABNORMAL HIGH (ref 80.0–100.0)
Platelets: 229 10*3/uL (ref 150–400)
RBC: 3.27 MIL/uL — ABNORMAL LOW (ref 4.22–5.81)
RDW: 12.6 % (ref 11.5–15.5)
WBC: 6.8 10*3/uL (ref 4.0–10.5)
nRBC: 0 % (ref 0.0–0.2)

## 2021-09-14 LAB — CBG MONITORING, ED: Glucose-Capillary: 131 mg/dL — ABNORMAL HIGH (ref 70–99)

## 2021-09-14 LAB — TROPONIN I (HIGH SENSITIVITY)
Troponin I (High Sensitivity): 20 ng/L — ABNORMAL HIGH (ref <18)
Troponin I (High Sensitivity): 21 ng/L — ABNORMAL HIGH (ref <18)

## 2021-09-14 MED ORDER — FUROSEMIDE 10 MG/ML IJ SOLN
40.0000 mg | INTRAMUSCULAR | Status: AC
  Administered 2021-09-14: 40 mg via INTRAVENOUS
  Filled 2021-09-14: qty 4

## 2021-09-14 MED ORDER — ASPIRIN 81 MG PO CHEW
324.0000 mg | CHEWABLE_TABLET | Freq: Once | ORAL | Status: AC
  Administered 2021-09-14: 324 mg via ORAL
  Filled 2021-09-14: qty 4

## 2021-09-14 MED ORDER — MORPHINE SULFATE (PF) 4 MG/ML IV SOLN
4.0000 mg | Freq: Once | INTRAVENOUS | Status: AC
  Administered 2021-09-14: 4 mg via INTRAVENOUS
  Filled 2021-09-14: qty 1

## 2021-09-14 NOTE — Discharge Instructions (Signed)
Your testing today did not show any signs of heart attack, either x-ray of your chest did show that you have a little extra fluid on your lungs.  Because of this I have given you a dose of a medication called furosemide.  This will cause you to urinate more frequently today, you will need to follow-up with your cardiologist this week, return to the ER for severe worsening symptoms.  Please make sure that you are taking all of the medications at home exactly as prescribed including your amiodarone and your blood thinner, Eliquis.  ER for worsening symptoms

## 2021-09-14 NOTE — ED Provider Notes (Signed)
[facility] EMERGENCY DEPARTMENT Provider Note   CSN: [id number] Arrival date & time: 09/14/21  6301     History  Chief Complaint  Patient presents with   Chest Pain    [name] [name] is a 66 y.o. male.   Chest Pain  This patient is a 66 year old male, history of atrial fibrillation on amiodarone and Eliquis, history of heart failure with reduced ejection fraction, last echocardiogram was November 2022 showing ejection fraction of 25 to 60% grade 1 diastolic dysfunction and global hypokinesis.  Nonobstructive coronary disease  The patient reports that he was in his usual state of health this morning while he was watching television about 1 hour ago when he had acute onset of pain in his sternal area.  There is no radiation, no shortness of breath, no nausea or vomiting, this does not seem to get worse with taking a deep breath but it does seem to fluctuate in intensity.  He denies any cocaine use, he still smokes cigarettes about 1 pack every 3 days, he drinks occasional alcohol last drink of beer was last night approximately 11 hours ago.  He denies any prior coronary disease that he knows of and has never had a stent, he does not have any swelling in his legs and has not been short of breath coughing or febrile.  He feels like he might of swallowed something that did not go down right but did not have anything to eat this morning.      Home Medications Prior to Admission medications   Medication Sig Start Date End Date Taking? Authorizing Provider  amiodarone (PACERONE) 200 MG tablet Take 0.5 tablets (100 mg total) by mouth daily. 06/01/21   Larey Dresser, MD  apixaban (ELIQUIS) 5 MG TABS tablet TAKE ONE TABLET BY MOUTH 2 TIMES A DAY 06/30/21   Arnoldo Lenis, MD  Blood Glucose Calibration (TRUE METRIX LEVEL 1) Low SOLN  11/15/19   [provider]  Blood Glucose Monitoring Suppl (TRUE METRIX METER) w/Device KIT  11/15/19   [provider]  dapagliflozin  propanediol (FARXIGA) 10 MG TABS tablet Take 10 mg by mouth daily.    [provider]  gabapentin (NEURONTIN) 100 MG capsule Take 200 mg by mouth 3 (three) times daily. 11/12/19   [provider]  levothyroxine (SYNTHROID) 137 MCG tablet Take 137 mcg by mouth every morning. 11/09/20   [provider]  lidocaine (LIDODERM) 5 % Place 1 patch onto the skin daily. Remove & Discard patch within 12 hours or as directed by MD 01/07/21   Kem Parkinson, PA-C  Magnesium 250 MG TABS Take 250 mg by mouth daily.    [provider]  metFORMIN (GLUCOPHAGE) 500 MG tablet Take 500 mg by mouth 2 (two) times daily. 04/20/19   [provider]  metoprolol succinate (TOPROL-XL) 50 MG 24 hr tablet Take 1 tablet (50 mg total) by mouth 2 (two) times daily. 10/28/20   Larey Dresser, MD  Oxycodone HCl 10 MG TABS Take 10 mg by mouth 4 (four) times daily as needed (for pain).  04/11/19   [provider]  oxyCODONE-acetaminophen (PERCOCET/ROXICET) 5-325 MG tablet Take 1 tablet by mouth every 6 (six) hours as needed for severe pain. 01/07/21   Triplett, Tammy, PA-C  potassium chloride SA (KLOR-CON) 20 MEQ tablet Take 1 tablet (20 mEq total) by mouth daily. 03/25/20   Larey Dresser, MD  sacubitril-valsartan (ENTRESTO) 24-26 MG Take 1 tablet by mouth 2 (two) times  daily. 08/01/20   Larey Dresser, MD  simvastatin (ZOCOR) 40 MG tablet Take 1 tablet (40 mg total) by mouth at bedtime. 08/15/14   Tysinger, Camelia Eng, PA-C  spironolactone (ALDACTONE) 25 MG tablet Take 1 tablet (25 mg total) by mouth daily. 04/28/20   Larey Dresser, MD  TRUE METRIX BLOOD GLUCOSE TEST test strip  12/26/19   [provider]  TRUEplus Lancets 33G [place]  11/15/19   [provider]      Allergies    Patient has no known allergies.    Review of Systems   Review of Systems  Cardiovascular:  Positive for chest pain.  All other systems reviewed and are negative.   Physical  Exam Updated Vital Signs BP 100/63   Pulse 65   Temp 98 F (36.7 C)   Resp 20   Ht 1.829 m (6')   Wt 95.3 kg   SpO2 97%   BMI 28.48 kg/m  Physical Exam Vitals and nursing note reviewed.  Constitutional:      General: He is not in acute distress.    Appearance: He is well-developed.  HENT:     Head: Normocephalic and atraumatic.     Mouth/Throat:     Pharynx: No oropharyngeal exudate.  Eyes:     General: No scleral icterus.       Right eye: No discharge.        Left eye: No discharge.     Conjunctiva/sclera: Conjunctivae normal.     Pupils: Pupils are equal, round, and reactive to light.  Neck:     Thyroid: No thyromegaly.     Vascular: No JVD.  Cardiovascular:     Rate and Rhythm: Normal rate and regular rhythm.     Heart sounds: Normal heart sounds. No murmur heard.    No friction rub. No gallop.  Pulmonary:     Effort: Pulmonary effort is normal. No respiratory distress.     Breath sounds: Normal breath sounds. No wheezing or rales.  Abdominal:     General: Bowel sounds are normal. There is no distension.     Palpations: Abdomen is soft. There is no mass.     Tenderness: There is no abdominal tenderness.  Musculoskeletal:        General: No tenderness. Normal range of motion.     Cervical back: Normal range of motion and neck supple.     Right lower leg: No edema.     Left lower leg: No edema.  Lymphadenopathy:     Cervical: No cervical adenopathy.  Skin:    General: Skin is warm and dry.     Findings: No erythema or rash.  Neurological:     Mental Status: He is alert.     Coordination: Coordination normal.  Psychiatric:        Behavior: Behavior normal.     ED Results / Procedures / Treatments   Labs (all labs ordered are listed, but only abnormal results are displayed) Labs Reviewed  BASIC METABOLIC PANEL - Abnormal; Notable for the following components:      Result Value   Glucose, Bld 133 (*)    BUN 25 (*)    Creatinine, Ser 2.04 (*)     Calcium 8.1 (*)    GFR, Estimated 36 (*)    All other components within normal limits  CBC - Abnormal; Notable for the following components:   RBC 3.27 (*)    Hemoglobin 11.7 (*)    HCT 35.4 (*)  MCV 108.3 (*)    MCH 35.8 (*)    All other components within normal limits  CBG MONITORING, ED - Abnormal; Notable for the following components:   Glucose-Capillary 131 (*)    All other components within normal limits  TROPONIN I (HIGH SENSITIVITY) - Abnormal; Notable for the following components:   Troponin I (High Sensitivity) 20 (*)    All other components within normal limits  TROPONIN I (HIGH SENSITIVITY) - Abnormal; Notable for the following components:   Troponin I (High Sensitivity) 21 (*)    All other components within normal limits    EKG EKG Interpretation  Date/Time:  Monday September 14 2021 07:06:02 EDT Ventricular Rate:  62 PR Interval:  171 QRS Duration: 148 QT Interval:  538 QTC Calculation: 547 R Axis:   -85 Text Interpretation: Sinus rhythm IVCD, consider atypical RBBB Inferior infarct, old compared with 12/22, no significant changes seen Confirmed by Noemi Chapel [phone]) on 09/14/2021 7:31:23 AM  Radiology DG Chest Portable 1 View  Result Date: 09/14/2021 CLINICAL DATA:  Chest pain and shortness of breath. EXAM: PORTABLE CHEST 1 VIEW COMPARISON:  01/07/2021 FINDINGS: Lung volumes are low. Cardiac enlargement. Pulmonary vascular congestion. Small right pleural effusion identified. Visualized osseous structures appear intact. IMPRESSION: 1. Cardiac enlargement, small right pleural effusion and pulmonary vascular congestion. Correlate for any clinical signs or symptoms of CHF. Electronically Signed   By: Kerby Moors M.D.   On: 09/14/2021 07:46    Procedures Procedures    Medications Ordered in ED Medications  furosemide (LASIX) injection 40 mg (has no administration in time range)  aspirin chewable tablet 324 mg (324 mg Oral Given 09/14/21 0713)  morphine (PF)  4 MG/ML injection 4 mg (4 mg Intravenous Given 09/14/21 1041)    ED Course/ Medical Decision Making/ A&P Clinical Course as of 09/14/21 1108  Mon Sep 14, 2021  0851 I personally viewed and interpreted the x-ray of the chest which does show some cardiomegaly as well as some vascular congestion, compared to chest x-ray from December 2022 this is all new findings [BM]  0851 Labs reviewed including a troponin of 20, though this is low we will need to repeat.  Creatinine seems to be baseline at 2 with a BUN of 25 and normal glycemic.  CBC without significant anemia or leukocytosis [BM]    Clinical Course User Index [BM] Noemi Chapel, MD                           Medical Decision Making Amount and/or Complexity of Data Reviewed Labs: ordered. Radiology: ordered.  Risk OTC drugs. Prescription drug management.   The patient's cardiac exam is rather unremarkable, he is in a sinus rhythm without any loud murmurs, he has no edema, his lungs are clear, the cause of his chest pain is unclear but is reassuring to know that he has had a heart cath with nonobstructive coronary disease and symptoms that did not come on with exertion.  Nevertheless EKG and labs will be ordered.  A chest x-ray will be ordered to make sure there is no signs of pneumonia pneumothorax and will check to rule out pericarditis as well.  The patient is having obvious intermittent paroxysms of discomfort which seem to be short-lived making it seem somewhat atypical as well.  He does not appear to be fluid overloaded.  The patient is agreeable to the work-up including a dose of aspirin.  This patient presents to the  ED for concern of chest pain, this involves an extensive number of treatment options, and is a complaint that carries with it a high risk of complications and morbidity.  The differential diagnosis includes coronary disease, pneumothorax, pericarditis, less likely to be pulmonary embolism, the patient is anticoagulated on  Eliquis   Co morbidities that complicate the patient evaluation  Hypertension, atrial fibrillation, congestive heart failure   Additional history obtained:  Additional history obtained from electronic medical record External records from outside source obtained and reviewed including recent echocardiogram from within the last year as well as heart catheterization   Lab Tests:  I Ordered, and personally interpreted labs.  The pertinent results include: CBC metabolic panel troponin, troponin flat at 20 and 21, CBC and metabolic panel overall unremarkable and unchanged, there is some renal insufficiency but this is chronic compared to prior labs.   Imaging Studies ordered:  I ordered imaging studies including chest x-ray I independently visualized and interpreted imaging which showed some cardiomegaly and vascular congestion I agree with the radiologist interpretation   Cardiac Monitoring: / EKG:  The patient was maintained on a cardiac monitor.  I personally viewed and interpreted the cardiac monitored which showed an underlying rhythm of: Sinus rhythm   Problem List / ED Course / Critical interventions / Medication management  This pain appears to be noncardiac, Lasix given for some pulmonary vascular congestion, review of the patient's home medication shows that he is not taking a diuretic other than spironolactone I ordered medication including furosemide for slight fluid overload Reevaluation of the patient after these medicines showed that the patient improved I have reviewed the patients home medicines and have made adjustments as needed   Social Determinants of Health:  Chronic congestive heart failure, stable to follow-up with outpatient cardiology   Test / Admission - Considered:  Considered admission but the patient has no elevation in troponins, no history of coronary disease, heart catheterization which was reassuring in the recent past and no evidence of acute  ischemia on EKG or troponin        Final Clinical Impression(s) / ED Diagnoses Final diagnoses:  Chest pain, unspecified type  Chronic congestive heart failure, unspecified heart failure type ([place])     Noemi Chapel, MD 09/14/21 1108

## 2021-09-14 NOTE — ED Triage Notes (Signed)
Pt arrived via pov c/o mid-chest pain and SOB  x 1 hour. states pain is a 9/10. Denies dizziness

## 2021-09-29 ENCOUNTER — Other Ambulatory Visit (HOSPITAL_COMMUNITY): Payer: Self-pay | Admitting: *Deleted

## 2021-09-29 MED ORDER — ENTRESTO 24-26 MG PO TABS: 1.0000 | ORAL_TABLET | Freq: Two times a day (BID) | ORAL | 11 refills | Status: DC

## 2021-09-29 MED ORDER — DAPAGLIFLOZIN PROPANEDIOL 10 MG PO TABS: 10.0000 mg | ORAL_TABLET | Freq: Every day | ORAL | 11 refills | Status: DC

## 2021-10-14 ENCOUNTER — Ambulatory Visit: Payer: Medicare HMO | Admitting: Podiatry

## 2021-10-17 ENCOUNTER — Emergency Department (HOSPITAL_COMMUNITY): Payer: Medicare HMO

## 2021-10-17 ENCOUNTER — Inpatient Hospital Stay (HOSPITAL_COMMUNITY): Payer: Medicare HMO

## 2021-10-17 ENCOUNTER — Inpatient Hospital Stay (HOSPITAL_COMMUNITY)
Admission: EM | Admit: 2021-10-17 | Discharge: 2021-10-19 | DRG: 291 | Disposition: A | Discharge status: Home / Self Care | Payer: Medicare HMO | Attending: Family Medicine | Admitting: Family Medicine

## 2021-10-17 ENCOUNTER — Other Ambulatory Visit (HOSPITAL_COMMUNITY): Payer: Self-pay | Admitting: *Deleted

## 2021-10-17 ENCOUNTER — Other Ambulatory Visit: Payer: Self-pay

## 2021-10-17 ENCOUNTER — Encounter (HOSPITAL_COMMUNITY): Payer: Self-pay | Admitting: Emergency Medicine

## 2021-10-17 DIAGNOSIS — I10 Essential (primary) hypertension: Secondary | ICD-10-CM | POA: Diagnosis present

## 2021-10-17 DIAGNOSIS — Z8249 Family history of ischemic heart disease and other diseases of the circulatory system: Secondary | ICD-10-CM

## 2021-10-17 DIAGNOSIS — I5021 Acute systolic (congestive) heart failure: Secondary | ICD-10-CM | POA: Diagnosis not present

## 2021-10-17 DIAGNOSIS — E782 Mixed hyperlipidemia: Secondary | ICD-10-CM | POA: Diagnosis not present

## 2021-10-17 DIAGNOSIS — F17210 Nicotine dependence, cigarettes, uncomplicated: Secondary | ICD-10-CM | POA: Diagnosis present

## 2021-10-17 DIAGNOSIS — Z79890 Hormone replacement therapy (postmenopausal): Secondary | ICD-10-CM | POA: Diagnosis not present

## 2021-10-17 DIAGNOSIS — Z7901 Long term (current) use of anticoagulants: Secondary | ICD-10-CM | POA: Diagnosis not present

## 2021-10-17 DIAGNOSIS — E1122 Type 2 diabetes mellitus with diabetic chronic kidney disease: Secondary | ICD-10-CM | POA: Diagnosis present

## 2021-10-17 DIAGNOSIS — F411 Generalized anxiety disorder: Secondary | ICD-10-CM | POA: Diagnosis not present

## 2021-10-17 DIAGNOSIS — I5022 Chronic systolic (congestive) heart failure: Secondary | ICD-10-CM | POA: Diagnosis present

## 2021-10-17 DIAGNOSIS — Z79899 Other long term (current) drug therapy: Secondary | ICD-10-CM

## 2021-10-17 DIAGNOSIS — I130 Hypertensive heart and chronic kidney disease with heart failure and stage 1 through stage 4 chronic kidney disease, or unspecified chronic kidney disease: Principal | ICD-10-CM | POA: Diagnosis present

## 2021-10-17 DIAGNOSIS — Z7984 Long term (current) use of oral hypoglycemic drugs: Secondary | ICD-10-CM | POA: Diagnosis not present

## 2021-10-17 DIAGNOSIS — E876 Hypokalemia: Secondary | ICD-10-CM | POA: Diagnosis present

## 2021-10-17 DIAGNOSIS — I5023 Acute on chronic systolic (congestive) heart failure: Secondary | ICD-10-CM | POA: Diagnosis present

## 2021-10-17 DIAGNOSIS — E1121 Type 2 diabetes mellitus with diabetic nephropathy: Secondary | ICD-10-CM | POA: Diagnosis present

## 2021-10-17 DIAGNOSIS — G8929 Other chronic pain: Secondary | ICD-10-CM | POA: Diagnosis present

## 2021-10-17 DIAGNOSIS — Z807 Family history of other malignant neoplasms of lymphoid, hematopoietic and related tissues: Secondary | ICD-10-CM

## 2021-10-17 DIAGNOSIS — I480 Paroxysmal atrial fibrillation: Secondary | ICD-10-CM | POA: Diagnosis present

## 2021-10-17 DIAGNOSIS — Z91148 Patient's other noncompliance with medication regimen for other reason: Secondary | ICD-10-CM

## 2021-10-17 DIAGNOSIS — E1169 Type 2 diabetes mellitus with other specified complication: Secondary | ICD-10-CM | POA: Diagnosis present

## 2021-10-17 DIAGNOSIS — I420 Dilated cardiomyopathy: Secondary | ICD-10-CM | POA: Diagnosis present

## 2021-10-17 DIAGNOSIS — E785 Hyperlipidemia, unspecified: Secondary | ICD-10-CM | POA: Diagnosis present

## 2021-10-17 DIAGNOSIS — N1831 Chronic kidney disease, stage 3a: Secondary | ICD-10-CM | POA: Diagnosis not present

## 2021-10-17 DIAGNOSIS — N179 Acute kidney failure, unspecified: Secondary | ICD-10-CM | POA: Diagnosis present

## 2021-10-17 LAB — BASIC METABOLIC PANEL
Anion gap: 8 (ref 5–15)
BUN: 17 mg/dL (ref 8–23)
CO2: 25 mmol/L (ref 22–32)
Calcium: 7.8 mg/dL — ABNORMAL LOW (ref 8.9–10.3)
Chloride: 108 mmol/L (ref 98–111)
Creatinine, Ser: 1.62 mg/dL — ABNORMAL HIGH (ref 0.61–1.24)
GFR, Estimated: 47 mL/min — ABNORMAL LOW (ref >60)
Glucose, Bld: 145 mg/dL — ABNORMAL HIGH (ref 70–99)
Potassium: 3.2 mmol/L — ABNORMAL LOW (ref 3.5–5.1)
Sodium: 141 mmol/L (ref 135–145)

## 2021-10-17 LAB — GLUCOSE, CAPILLARY
Glucose-Capillary: 118 mg/dL — ABNORMAL HIGH (ref 70–99)
Glucose-Capillary: 160 mg/dL — ABNORMAL HIGH (ref 70–99)

## 2021-10-17 LAB — CBC
HCT: 37 % — ABNORMAL LOW (ref 39.0–52.0)
Hemoglobin: 11.9 g/dL — ABNORMAL LOW (ref 13.0–17.0)
MCH: 34.7 pg — ABNORMAL HIGH (ref 26.0–34.0)
MCHC: 32.2 g/dL (ref 30.0–36.0)
MCV: 107.9 fL — ABNORMAL HIGH (ref 80.0–100.0)
Platelets: 237 10*3/uL (ref 150–400)
RBC: 3.43 MIL/uL — ABNORMAL LOW (ref 4.22–5.81)
RDW: 14.1 % (ref 11.5–15.5)
WBC: 7 10*3/uL (ref 4.0–10.5)
nRBC: 0 % (ref 0.0–0.2)

## 2021-10-17 LAB — BRAIN NATRIURETIC PEPTIDE: B Natriuretic Peptide: 2970 pg/mL — ABNORMAL HIGH (ref 0.0–100.0)

## 2021-10-17 LAB — TROPONIN I (HIGH SENSITIVITY)
Troponin I (High Sensitivity): 57 ng/L — ABNORMAL HIGH (ref <18)
Troponin I (High Sensitivity): 65 ng/L — ABNORMAL HIGH (ref <18)

## 2021-10-17 LAB — ECHOCARDIOGRAM COMPLETE
Area-P 1/2: 4.06 cm2
Calc EF: 19.3 %
Height: 72 in
MV M vel: 3.94 m/s
MV Peak grad: 62.1 mmHg
S' Lateral: 5.5 cm
Single Plane A2C EF: 19.1 %
Single Plane A4C EF: 16.6 %
Weight: 3312 oz

## 2021-10-17 LAB — HEMOGLOBIN A1C
Hgb A1c MFr Bld: 6.1 % — ABNORMAL HIGH (ref 4.8–5.6)
Mean Plasma Glucose: 128.37 mg/dL

## 2021-10-17 LAB — CBG MONITORING, ED: Glucose-Capillary: 115 mg/dL — ABNORMAL HIGH (ref 70–99)

## 2021-10-17 LAB — MAGNESIUM: Magnesium: 1.6 mg/dL — ABNORMAL LOW (ref 1.7–2.4)

## 2021-10-17 MED ORDER — NICOTINE 21 MG/24HR TD PT24: 21.0000 mg | MEDICATED_PATCH | Freq: Every day | TRANSDERMAL | Status: DC | PRN

## 2021-10-17 MED ORDER — SIMVASTATIN 20 MG PO TABS
40.0000 mg | ORAL_TABLET | Freq: Every day | ORAL | Status: DC
  Administered 2021-10-17: 40 mg via ORAL
  Filled 2021-10-17: qty 2

## 2021-10-17 MED ORDER — POTASSIUM CHLORIDE CRYS ER 20 MEQ PO TBCR
40.0000 meq | EXTENDED_RELEASE_TABLET | Freq: Every day | ORAL | Status: DC
  Administered 2021-10-17 – 2021-10-19 (×3): 40 meq via ORAL
  Filled 2021-10-17 (×3): qty 2

## 2021-10-17 MED ORDER — PERFLUTREN LIPID MICROSPHERE
1.0000 mL | INTRAVENOUS | Status: AC | PRN
  Administered 2021-10-17: 2 mL via INTRAVENOUS

## 2021-10-17 MED ORDER — ALBUTEROL SULFATE HFA 108 (90 BASE) MCG/ACT IN AERS: 2.0000 | INHALATION_SPRAY | RESPIRATORY_TRACT | Status: DC | PRN

## 2021-10-17 MED ORDER — METOPROLOL SUCCINATE ER 50 MG PO TB24
50.0000 mg | ORAL_TABLET | Freq: Two times a day (BID) | ORAL | Status: DC
  Administered 2021-10-17: 50 mg via ORAL
  Filled 2021-10-17: qty 1

## 2021-10-17 MED ORDER — APIXABAN 5 MG PO TABS
5.0000 mg | ORAL_TABLET | Freq: Two times a day (BID) | ORAL | Status: DC
  Administered 2021-10-17 – 2021-10-19 (×5): 5 mg via ORAL
  Filled 2021-10-17 (×5): qty 1

## 2021-10-17 MED ORDER — BISACODYL 5 MG PO TBEC: 5.0000 mg | DELAYED_RELEASE_TABLET | Freq: Every day | ORAL | Status: DC | PRN

## 2021-10-17 MED ORDER — ONDANSETRON HCL 4 MG PO TABS: 4.0000 mg | ORAL_TABLET | Freq: Four times a day (QID) | ORAL | Status: DC | PRN

## 2021-10-17 MED ORDER — METOPROLOL SUCCINATE ER 50 MG PO TB24
50.0000 mg | ORAL_TABLET | Freq: Every day | ORAL | Status: DC
  Administered 2021-10-18 – 2021-10-19 (×2): 50 mg via ORAL
  Filled 2021-10-17 (×2): qty 1

## 2021-10-17 MED ORDER — OXYCODONE HCL 5 MG PO TABS
10.0000 mg | ORAL_TABLET | Freq: Four times a day (QID) | ORAL | Status: DC | PRN
  Administered 2021-10-17 – 2021-10-19 (×4): 10 mg via ORAL
  Filled 2021-10-17 (×4): qty 2

## 2021-10-17 MED ORDER — FUROSEMIDE 10 MG/ML IJ SOLN
80.0000 mg | Freq: Once | INTRAMUSCULAR | Status: AC
  Administered 2021-10-17: 80 mg via INTRAVENOUS
  Filled 2021-10-17: qty 8

## 2021-10-17 MED ORDER — ONDANSETRON HCL 4 MG/2ML IJ SOLN: 4.0000 mg | Freq: Four times a day (QID) | INTRAMUSCULAR | Status: DC | PRN

## 2021-10-17 MED ORDER — MAGNESIUM SULFATE 4 GM/100ML IV SOLN
4.0000 g | Freq: Once | INTRAVENOUS | Status: AC
  Administered 2021-10-17: 4 g via INTRAVENOUS
  Filled 2021-10-17: qty 100

## 2021-10-17 MED ORDER — MAGNESIUM OXIDE -MG SUPPLEMENT 400 (240 MG) MG PO TABS
200.0000 mg | ORAL_TABLET | Freq: Every day | ORAL | Status: DC
  Administered 2021-10-17 – 2021-10-19 (×3): 200 mg via ORAL
  Filled 2021-10-17 (×3): qty 1

## 2021-10-17 MED ORDER — LEVOTHYROXINE SODIUM 137 MCG PO TABS
137.0000 ug | ORAL_TABLET | Freq: Every morning | ORAL | Status: DC
  Administered 2021-10-17 – 2021-10-19 (×3): 137 ug via ORAL
  Filled 2021-10-17 (×3): qty 1

## 2021-10-17 MED ORDER — INSULIN ASPART 100 UNIT/ML IJ SOLN
3.0000 [IU] | Freq: Three times a day (TID) | INTRAMUSCULAR | Status: DC
  Administered 2021-10-18 – 2021-10-19 (×4): 3 [IU] via SUBCUTANEOUS

## 2021-10-17 MED ORDER — ACETAMINOPHEN 650 MG RE SUPP: 650.0000 mg | Freq: Four times a day (QID) | RECTAL | Status: DC | PRN

## 2021-10-17 MED ORDER — AMIODARONE HCL 200 MG PO TABS
100.0000 mg | ORAL_TABLET | Freq: Every day | ORAL | Status: DC
  Administered 2021-10-17 – 2021-10-19 (×3): 100 mg via ORAL
  Filled 2021-10-17 (×3): qty 1

## 2021-10-17 MED ORDER — ACETAMINOPHEN 325 MG PO TABS: 650.0000 mg | ORAL_TABLET | Freq: Four times a day (QID) | ORAL | Status: DC | PRN

## 2021-10-17 MED ORDER — LIVING BETTER WITH HEART FAILURE BOOK: Freq: Once | Status: AC

## 2021-10-17 MED ORDER — INSULIN ASPART 100 UNIT/ML IJ SOLN
0.0000 [IU] | Freq: Three times a day (TID) | INTRAMUSCULAR | Status: DC
  Administered 2021-10-18: 2 [IU] via SUBCUTANEOUS
  Administered 2021-10-18: 1 [IU] via SUBCUTANEOUS
  Administered 2021-10-19: 3 [IU] via SUBCUTANEOUS

## 2021-10-17 MED ORDER — FUROSEMIDE 10 MG/ML IJ SOLN: 30.0000 mg | Freq: Two times a day (BID) | INTRAMUSCULAR | Status: DC

## 2021-10-17 NOTE — Assessment & Plan Note (Addendum)
-   Resume home metoprolol

## 2021-10-17 NOTE — Assessment & Plan Note (Signed)
-   Follow renal function with diuresis

## 2021-10-17 NOTE — Progress Notes (Signed)
   10/17/21 1822  ReDS Vest / Clip  Station Marker D  Ruler Value 32  ReDS Value Range 36 - 40  ReDS Actual Value 37  Anatomical Comments 37% (Took 3 tries to obtain reading, patient coughing)   Admission Reds clip reading.

## 2021-10-17 NOTE — H&P (Signed)
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  He reports that he had been prescribed to take torsemide as needed but has not taken it in quite some time.  He also reports he is not taking his Entresto or spironolactone.  He complains of shortness of breath and fatigue.  He denies chest pain.  His symptoms of shortness of breath have worsened in the past 2 days and he has had a dry cough.  His chest x-ray shows findings of pleural effusions and CHF.  He was given IV Lasix for diuresis and admission was requested for further management.     Past Medical History:  Diagnosis Date   Anxiety    CHF (congestive heart failure) ([place])    a. EF 15% in 2013 with cath showing normal cors b. EF 30-35% by repeat echo in 3220   Chronic systolic heart failure ([place])    Diabetes mellitus    Fluid retention in legs    Hypertension     Past Surgical History:  Procedure Laterality Date   COLONOSCOPY  01/04/2012   Procedure: COLONOSCOPY;  Surgeon: Danie Binder, MD;  Location: [facility];  Service: Endoscopy;  Laterality: N/A;  1:30 PM   HERNIA REPAIR     LEFT AND RIGHT HEART CATHETERIZATION WITH CORONARY ANGIOGRAM N/A 07/09/2011   Procedure: LEFT AND RIGHT HEART  CATHETERIZATION WITH CORONARY ANGIOGRAM;  Surgeon: Peter M Martinique, MD;  Location: [facility] CATH LAB;  Service: Cardiovascular;  Laterality: N/A;   RIGHT/LEFT HEART CATH AND CORONARY ANGIOGRAPHY N/A 02/25/2020   Procedure: RIGHT/LEFT HEART CATH AND CORONARY ANGIOGRAPHY;  Surgeon: Larey Dresser, MD;  Location: [place] CV LAB;  Service: Cardiovascular;  Laterality: N/A;     reports that he has been smoking cigarettes and e-cigarettes. He started smoking about 39 years ago. He has been smoking an average of .25 packs per day. He has never used smokeless tobacco. He reports that he does not currently use alcohol. He reports that he does not use drugs.  No Known Allergies  Family History  Problem Relation Age of Onset   Heart attack Father    Heart failure Father    Cancer Mother        Multiple myeloma   Heart disease Sister     Prior to Admission medications   Medication Sig Start Date End Date Taking? Authorizing Provider  amiodarone (PACERONE) 200 MG tablet Take 0.5 tablets (100 mg total) by mouth daily. 06/01/21  Yes Larey Dresser, MD  apixaban (ELIQUIS) 5 MG TABS tablet TAKE ONE TABLET BY MOUTH 2 TIMES A DAY 06/30/21  Yes Branch, Alphonse Guild, MD  dapagliflozin propanediol (FARXIGA) 10 MG TABS tablet Take 1 tablet (10 mg total) by mouth daily. 09/29/21  Yes Larey Dresser, MD  gabapentin (NEURONTIN) 100 MG capsule Take 200 mg by  mouth 3 (three) times daily. 11/12/19  Yes [provider]  levothyroxine (SYNTHROID) 137 MCG tablet Take 137 mcg by mouth every morning. 11/09/20  Yes [provider]  Magnesium 250 MG TABS Take 250 mg by mouth daily.   Yes [provider]  metFORMIN (GLUCOPHAGE) 500 MG tablet Take 500 mg by mouth 2 (two) times daily. 04/20/19  Yes [provider]  metoprolol succinate (TOPROL-XL) 50 MG 24 hr tablet Take 1 tablet (50 mg total) by mouth 2 (two) times daily. 10/28/20  Yes Larey Dresser, MD  Oxycodone HCl 10 MG TABS Take 10 mg by  mouth 4 (four) times daily as needed (for pain).  04/11/19  Yes [provider]  torsemide (DEMADEX) 20 MG tablet Take 20 mg by mouth daily as needed (fluid).   Yes [provider]  Blood Glucose Calibration (TRUE METRIX LEVEL 1) Low SOLN  11/15/19   [provider]  Blood Glucose Monitoring Suppl (TRUE METRIX METER) w/Device KIT  11/15/19   [provider]  potassium chloride SA (KLOR-CON) 20 MEQ tablet Take 1 tablet (20 mEq total) by mouth daily. Patient not taking: Reported on 10/17/2021 03/25/20   Larey Dresser, MD  sacubitril-valsartan (ENTRESTO) 24-26 MG Take 1 tablet by mouth 2 (two) times daily. Patient not taking: Reported on 10/17/2021 09/29/21   Larey Dresser, MD  simvastatin (ZOCOR) 40 MG tablet Take 1 tablet (40 mg total) by mouth at bedtime. Patient not taking: Reported on 10/17/2021 08/15/14   Tysinger, Camelia Eng, PA-C  spironolactone (ALDACTONE) 25 MG tablet Take 1 tablet (25 mg total) by mouth daily. Patient not taking: Reported on 10/17/2021 04/28/20   Larey Dresser, MD  TRUE [facility] BLOOD GLUCOSE TEST test strip  12/26/19   [provider]  TRUEplus Lancets 33G [place]  11/15/19   [provider]    Physical Exam: Vitals:   10/17/21 1000 10/17/21 1015 10/17/21 1045 10/17/21 1053  BP:      Pulse:  63    Resp: 18 (!) 23 (!) 30   Temp:    97.6 F (36.4 C)  TempSrc:    Oral  SpO2:  99%    Weight:      Height:        Constitutional: chronically ill appearing, appears older than stated age, NAD, calm,  Eyes: PERRL, lids and conjunctivae normal ENMT: Mucous membranes are moist. Posterior pharynx clear of any exudate or lesions.Normal dentition.  Neck: normal, supple, no masses, no thyromegaly Respiratory: diffuse crackles heard.   Cardiovascular: normal s1, s2 sounds, no murmurs / rubs / gallops. Trace pretibial extremity edema. 2+ pedal pulses. No carotid bruits.  Abdomen: no tenderness, no masses palpated. Appears  ascitic. No hepatosplenomegaly. Bowel sounds positive.  Musculoskeletal: no clubbing / cyanosis. No joint deformity upper and lower extremities. Good ROM, no contractures. Normal muscle tone.  Skin: no rashes, lesions, ulcers. No induration Neurologic: CN 2-12 grossly intact. Sensation intact, DTR normal. Strength 5/5 in all 4.  Psychiatric: poor judgment and insight. Alert and oriented x 3. Normal mood.   Labs on Admission: I have personally reviewed following labs and imaging studies  CBC: Recent Labs  Lab 10/17/21 0647  WBC 7.0  HGB 11.9*  HCT 37.0*  MCV 107.9*  PLT 426   Basic Metabolic Panel: Recent Labs  Lab 10/17/21 0647  NA 141  K 3.2*  CL 108  CO2 25  GLUCOSE 145*  BUN 17  CREATININE 1.62*  CALCIUM 7.8*  GFR: Estimated Creatinine Clearance: 54.8 mL/min (A) (by C-G formula based on SCr of 1.62 mg/dL (H)). Liver Function Tests: No results for input(s): "AST", "ALT", "ALKPHOS", "BILITOT", "PROT", "ALBUMIN" in the last 168 hours. No results for input(s): "LIPASE", "AMYLASE" in the last 168 hours. No results for input(s): "AMMONIA" in the last 168 hours. Coagulation Profile: No results for input(s): "INR", "PROTIME" in the last 168 hours. Cardiac Enzymes: No results for input(s): "CKTOTAL", "CKMB", "CKMBINDEX", "TROPONINI" in the last 168 hours. BNP (last 3 results) No results for input(s): "PROBNP" in the last 8760 hours. HbA1C: No results for input(s): "HGBA1C" in the last 72 hours. CBG: No results for input(s): "GLUCAP" in the last 168 hours. Lipid Profile: No results for input(s): "CHOL", "HDL", "LDLCALC", "TRIG", "CHOLHDL", "LDLDIRECT" in the last 72 hours. Thyroid Function Tests: No results for input(s): "TSH", "T4TOTAL", "FREET4", "T3FREE", "THYROIDAB" in the last 72 hours. Anemia Panel: No results for input(s): "VITAMINB12", "FOLATE", "FERRITIN", "TIBC", "IRON", "RETICCTPCT" in the last 72 hours. Urine analysis:    Component Value Date/Time    COLORURINE YELLOW 06/28/2006 1102   APPEARANCEUR CLEAR 06/28/2006 1102   LABSPEC 1.020 06/28/2006 1102   PHURINE 6.0 06/28/2006 1102   GLUCOSEU NEGATIVE 06/28/2006 1102   HGBUR NEGATIVE 06/28/2006 1102   BILIRUBINUR NEGATIVE 06/28/2006 1102   KETONESUR NEGATIVE 06/28/2006 1102   PROTEINUR NEGATIVE 06/28/2006 1102   UROBILINOGEN 1.0 06/28/2006 1102   NITRITE NEGATIVE 06/28/2006 1102   LEUKOCYTESUR  06/28/2006 1102    NEGATIVE MICROSCOPIC NOT DONE ON URINES WITH NEGATIVE PROTEIN, BLOOD, LEUKOCYTES, NITRITE, OR GLUCOSE <1000 mg/dL.    Radiological Exams on Admission: DG Chest Portable 1 View  Result Date: 10/17/2021 CLINICAL DATA:  Shortness of breath with history of CHF. Chest pain. EXAM: PORTABLE CHEST 1 VIEW COMPARISON:  09/14/2021 FINDINGS: Stable cardiac enlargement. Lungs are hypoinflated. Moderate right pleural effusion with veil like opacification of the right lower lobe has increased from previous exam. Pulmonary vascular congestion identified. IMPRESSION: 1. Progressive right pleural effusion and pulmonary vascular congestion concerning for CHF. Electronically Signed   By: Kerby Moors M.D.   On: 10/17/2021 06:51    EKG: Independently reviewed.  Assessment/Plan Principal Problem:   Acute HFrEF (heart failure with reduced ejection fraction) (HCC) Active Problems:   Chronic systolic heart failure (HCC)   Type 2 diabetes with nephropathy (HCC)   Hypertension   Cardiomyopathy, dilated, nonischemic (HCC)   Generalized anxiety disorder   Hyperlipidemia   Chronic pain   Chronic renal failure, stage 3a (HCC)   Assessment and Plan: * Acute HFrEF (heart failure with reduced ejection fraction) ([place]) - Secondary to poor compliance with taking medications - Continue IV Lasix for now - Reports not taking torsemide at home or other diuretics - Not following up with advanced heart failure clinic - TTE pending - ReDS vest reading requested - Heart failure protocol initiated -  Monitor intake and output Daily weights and electrolytes  Hypokalemia - Replacement ordered, check magnesium and replete as needed  Chronic renal failure, stage 3a ([place]) - Follow renal function with diuresis  Chronic pain - Pain management ordered as needed  Hyperlipidemia - Resume home simvastatin  Generalized anxiety disorder - Currently stable, following  Cardiomyopathy, dilated, nonischemic ([place]) - Patient not taking Entresto any longer - Follow-up TTE - We will request cardiology consultation on 10/2  Hypertension - Resume home metoprolol  Type 2 diabetes with nephropathy (HCC) - SSI coverage with CBG monitoring and prandial coverage ordered -Follow-up hemoglobin K2O  Chronic systolic  heart failure (HCC) - Follow-up TTE   DVT prophylaxis: apixaban   Code Status: Full   Family Communication:   Disposition Plan: TBD   Consults called:   Admission status: INP  Level of care: Telemetry Irwin Brakeman MD [facility] How to contact the [facility] Attending or Consulting provider [place] or covering provider during after hours [place], for this patient?  Check the care team in [facility] and look for a) attending/consulting TR[facility] provider listed and b) the [facility] team listed Log into [email or web address] and use [place]'s universal password to access. If you do not have the password, please contact the hospital operator. Locate the [facility] provider you are looking for under [facility] and page to a number that you can be directly reached. If you still have difficulty reaching the provider, please page the [facility] (Director on Call) for the Hospitalists listed on amion for assistance.   If 7PM-7AM, please contact night-coverage [email or web address] Password [email or web address]  10/17/2021, 10:56 AM

## 2021-10-17 NOTE — Assessment & Plan Note (Signed)
-   Patient not taking Entresto when he was admitted - Follow-up TTE: EF<20%  - Outpatient cardiology follow up with [facility] [place] - see cardiology consult notes and recommendations

## 2021-10-17 NOTE — Assessment & Plan Note (Signed)
-   Resume home simvastatin

## 2021-10-17 NOTE — ED Provider Notes (Signed)
 [facility] EMERGENCY DEPARTMENT  Provider Note  CSN: [id number] Arrival date & time: 10/17/21 0616  History Chief Complaint  Patient presents with   Shortness of Breath    [name] [name] is a 65 y.o. male with history of NICM and HFrEF (25-30% in 2022) reports 1-2 days of worsening DOE and orthopnea. Mild dry cough, no fever. No chest pains. No leg swelling. He has a diuretic that he only takes as needed but has not taken any since feeling SOB.    Home Medications Prior to Admission medications   Medication Sig Start Date End Date Taking? Authorizing Provider  amiodarone (PACERONE) 200 MG tablet Take 0.5 tablets (100 mg total) by mouth daily. 06/01/21   McLean, Dalton S, MD  apixaban (ELIQUIS) 5 MG TABS tablet TAKE ONE TABLET BY MOUTH 2 TIMES A DAY 06/30/21   Branch, Jonathan F, MD  Blood Glucose Calibration (TRUE METRIX LEVEL 1) Low SOLN  11/15/19   [provider]  Blood Glucose Monitoring Suppl (TRUE METRIX METER) w/Device KIT  11/15/19   [provider]  dapagliflozin propanediol (FARXIGA) 10 MG TABS tablet Take 1 tablet (10 mg total) by mouth daily. 09/29/21   McLean, Dalton S, MD  gabapentin (NEURONTIN) 100 MG capsule Take 200 mg by mouth 3 (three) times daily. 11/12/19   [provider]  levothyroxine (SYNTHROID) 137 MCG tablet Take 137 mcg by mouth every morning. 11/09/20   [provider]  lidocaine (LIDODERM) 5 % Place 1 patch onto the skin daily. Remove & Discard patch within 12 hours or as directed by MD 01/07/21   Triplett, Tammy, PA-C  Magnesium 250 MG TABS Take 250 mg by mouth daily.    [provider]  metFORMIN (GLUCOPHAGE) 500 MG tablet Take 500 mg by mouth 2 (two) times daily. 04/20/19   [provider]  metoprolol succinate (TOPROL-XL) 50 MG 24 hr tablet Take 1 tablet (50 mg total) by mouth 2 (two) times daily. 10/28/20   McLean, Dalton S, MD  Oxycodone HCl 10 MG TABS Take 10 mg by mouth 4 (four) times daily as  needed (for pain).  04/11/19   [provider]  oxyCODONE-acetaminophen (PERCOCET/ROXICET) 5-325 MG tablet Take 1 tablet by mouth every 6 (six) hours as needed for severe pain. 01/07/21   Triplett, Tammy, PA-C  potassium chloride SA (KLOR-CON) 20 MEQ tablet Take 1 tablet (20 mEq total) by mouth daily. 03/25/20   McLean, Dalton S, MD  sacubitril-valsartan (ENTRESTO) 24-26 MG Take 1 tablet by mouth 2 (two) times daily. 09/29/21   McLean, Dalton S, MD  simvastatin (ZOCOR) 40 MG tablet Take 1 tablet (40 mg total) by mouth at bedtime. 08/15/14   Tysinger, David S, PA-C  spironolactone (ALDACTONE) 25 MG tablet Take 1 tablet (25 mg total) by mouth daily. 04/28/20   McLean, Dalton S, MD  TRUE METRIX BLOOD GLUCOSE TEST test strip  12/26/19   [provider]  TRUEplus Lancets 33G MISC  11/15/19   [provider]     Allergies    Patient has no known allergies.   Review of Systems   Review of Systems Please see HPI for pertinent positives and negatives  Physical Exam BP 129/85   Pulse 63   Temp 98.2 F (36.8 C) (Oral)   Resp (!) 24   Ht 6' (1.829 m)   Wt 93.9 kg   SpO2 96% Comment: pt placed on O2 for comfort  BMI 28.07 kg/m   Physical Exam Vitals   and nursing note reviewed.  Constitutional:      Appearance: Normal appearance.  HENT:     Head: Normocephalic and atraumatic.     Nose: Nose normal.     Mouth/Throat:     Mouth: Mucous membranes are moist.  Eyes:     Extraocular Movements: Extraocular movements intact.     Conjunctiva/sclera: Conjunctivae normal.  Cardiovascular:     Rate and Rhythm: Normal rate.  Pulmonary:     Effort: Pulmonary effort is normal. Tachypnea present.     Breath sounds: Examination of the right-lower field reveals rales. Examination of the left-lower field reveals rales. Rales present.  Abdominal:     General: Abdomen is flat.     Palpations: Abdomen is soft.     Tenderness: There is no abdominal tenderness.  Musculoskeletal:         General: No swelling. Normal range of motion.     Cervical back: Neck supple.     Right lower leg: Edema (trace) present.     Left lower leg: Edema (trace) present.  Skin:    General: Skin is warm and dry.  Neurological:     General: No focal deficit present.     Mental Status: He is alert.  Psychiatric:        Mood and Affect: Mood normal.     ED Results / Procedures / Treatments   EKG EKG Interpretation  Date/Time:  Saturday October 17 2021 06:28:44 EDT Ventricular Rate:  68 PR Interval:  188 QRS Duration: 133 QT Interval:  508 QTC Calculation: 541 R Axis:   -80 Text Interpretation: Sinus rhythm IVCD, consider atypical RBBB No significant change since last tracing Confirmed by Sheldon, Charles ([id number]) on 10/17/2021 6:31:08 AM  Procedures Procedures  Medications Ordered in the ED Medications  albuterol (VENTOLIN HFA) 108 (90 Base) MCG/ACT inhaler 2 puff (has no administration in time range)  furosemide (LASIX) injection 80 mg (has no administration in time range)    Initial Impression and Plan  Patient here with symptoms concerning for acute on chronic CHF. Less likely PNA, PTX or ACS. He is tachypneic and borderline hypoxic. Will place on 2L [place], check labs and CXR. EKG is unchanged. Currently NSR on monitor.   ED Course   Clinical Course as of 10/17/21 0656  Sat Oct 17, 2021  0654 I personally viewed the images from radiology studies and agree with radiologist interpretation: CXR consistent with CHF. Will begin Lasix IV  [CS]  0655 Care of the patient will be signed out to the oncoming team at the change of shift.  [CS]    Clinical Course User Index [CS] Sheldon, Charles B, MD     MDM Rules/Calculators/A&P Medical Decision Making Problems Addressed: Acute on chronic systolic congestive heart failure (HCC): acute illness or injury  Amount and/or Complexity of Data Reviewed Labs: ordered. Radiology: ordered and independent interpretation performed.  Decision-making details documented in ED Course. ECG/medicine tests: ordered and independent interpretation performed. Decision-making details documented in ED Course.  Risk Prescription drug management.    Final Clinical Impression(s) / ED Diagnoses Final diagnoses:  Acute on chronic systolic congestive heart failure (HCC)    Rx / DC Orders ED Discharge Orders     None        Sheldon, Charles B, MD 10/17/21 0656  

## 2021-10-17 NOTE — ED Notes (Signed)
Audible coarse wheezing noted

## 2021-10-17 NOTE — ED Notes (Signed)
Pt given lunch tray.

## 2021-10-17 NOTE — Assessment & Plan Note (Addendum)
-   Secondary to poor compliance with taking medications - Continue IV Lasix for now - Reports not taking torsemide at home or other diuretics - Not regularly following up with advanced heart failure clinic - TTEwith EF<20%  - ReDS vest reading requested: 37 - Heart failure protocol initiated - Monitor intake and output Daily weights and electrolytes   Appreciate cardiology consult:  Pt now appears euvolemic He has been restarted on his home cardiac meds and cardiology is going to assist him in getting his meds.  They said that he can discharge today. He is ambulating well.  Feeling his baseline.  He has outpatient follow up with [facility] [place] clinic. DC home today.  New Rxs sent.

## 2021-10-17 NOTE — Assessment & Plan Note (Addendum)
-   TTE with worsened EF <20%

## 2021-10-17 NOTE — ED Notes (Signed)
Patient reports feeling shob while lying in bed

## 2021-10-17 NOTE — Assessment & Plan Note (Signed)
-   Currently stable, following

## 2021-10-17 NOTE — Plan of Care (Signed)
  Problem: Health Behavior/Discharge Planning: Goal: Ability to identify and utilize available resources and services will improve Outcome: Progressing   Problem: Skin Integrity: Goal: Risk for impaired skin integrity will decrease Outcome: Progressing   Problem: Education: Goal: Knowledge of General Education information will improve Description: Including pain rating scale, medication(s)/side effects and non-pharmacologic comfort measures Outcome: Progressing

## 2021-10-17 NOTE — ED Notes (Signed)
Correction to charting below, patient ate less than 50% of lunch

## 2021-10-17 NOTE — Progress Notes (Signed)
*  PRELIMINARY RESULTS* Echocardiogram 2D Echocardiogram has been performed with [facility].  [name] [name] 10/17/2021, 12:01 PM

## 2021-10-17 NOTE — ED Triage Notes (Addendum)
Pt with c/o SOB since last night. Pt with hx of CHF and states he has "fluid pills to take as needed" but hasn't taken any.

## 2021-10-17 NOTE — Assessment & Plan Note (Signed)
-   SSI coverage with CBG monitoring and prandial coverage ordered -Follow-up hemoglobin A1c

## 2021-10-17 NOTE — Hospital Course (Signed)
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  He reports that he had been prescribed to take torsemide as needed but has not taken it in quite some time.  He also reports he is not taking his Entresto or spironolactone.  He complains of shortness of breath and fatigue.  He denies chest pain.  His symptoms of shortness of breath have worsened in the past 2 days and he has had a dry cough.  His chest x-ray shows findings of pleural effusions and CHF.  He was given IV Lasix for diuresis and admission was requested for further management.

## 2021-10-17 NOTE — Assessment & Plan Note (Signed)
-   Replacement ordered, check magnesium and replete as needed

## 2021-10-17 NOTE — Assessment & Plan Note (Signed)
-   Pain management ordered as needed

## 2021-10-18 DIAGNOSIS — E1121 Type 2 diabetes mellitus with diabetic nephropathy: Secondary | ICD-10-CM

## 2021-10-18 DIAGNOSIS — E876 Hypokalemia: Secondary | ICD-10-CM

## 2021-10-18 DIAGNOSIS — E782 Mixed hyperlipidemia: Secondary | ICD-10-CM

## 2021-10-18 DIAGNOSIS — F411 Generalized anxiety disorder: Secondary | ICD-10-CM

## 2021-10-18 DIAGNOSIS — I5022 Chronic systolic (congestive) heart failure: Secondary | ICD-10-CM | POA: Diagnosis not present

## 2021-10-18 DIAGNOSIS — I5021 Acute systolic (congestive) heart failure: Secondary | ICD-10-CM | POA: Diagnosis not present

## 2021-10-18 DIAGNOSIS — I420 Dilated cardiomyopathy: Secondary | ICD-10-CM | POA: Diagnosis not present

## 2021-10-18 LAB — BASIC METABOLIC PANEL
Anion gap: 8 (ref 5–15)
BUN: 16 mg/dL (ref 8–23)
CO2: 26 mmol/L (ref 22–32)
Calcium: 8 mg/dL — ABNORMAL LOW (ref 8.9–10.3)
Chloride: 105 mmol/L (ref 98–111)
Creatinine, Ser: 1.61 mg/dL — ABNORMAL HIGH (ref 0.61–1.24)
GFR, Estimated: 47 mL/min — ABNORMAL LOW (ref >60)
Glucose, Bld: 136 mg/dL — ABNORMAL HIGH (ref 70–99)
Potassium: 3.7 mmol/L (ref 3.5–5.1)
Sodium: 139 mmol/L (ref 135–145)

## 2021-10-18 LAB — GLUCOSE, CAPILLARY
Glucose-Capillary: 185 mg/dL — ABNORMAL HIGH (ref 70–99)
Glucose-Capillary: 132 mg/dL — ABNORMAL HIGH (ref 70–99)
Glucose-Capillary: 74 mg/dL (ref 70–99)
Glucose-Capillary: 143 mg/dL — ABNORMAL HIGH (ref 70–99)

## 2021-10-18 LAB — HIV ANTIBODY (ROUTINE TESTING W REFLEX): HIV Screen 4th Generation wRfx: NONREACTIVE

## 2021-10-18 LAB — BRAIN NATRIURETIC PEPTIDE: B Natriuretic Peptide: 2652 pg/mL — ABNORMAL HIGH (ref 0.0–100.0)

## 2021-10-18 LAB — MAGNESIUM: Magnesium: 2.5 mg/dL — ABNORMAL HIGH (ref 1.7–2.4)

## 2021-10-18 MED ORDER — SIMVASTATIN 20 MG PO TABS
20.0000 mg | ORAL_TABLET | Freq: Every day | ORAL | Status: DC
  Administered 2021-10-18: 20 mg via ORAL
  Filled 2021-10-18: qty 1

## 2021-10-18 MED ORDER — FUROSEMIDE 10 MG/ML IJ SOLN
30.0000 mg | Freq: Two times a day (BID) | INTRAMUSCULAR | Status: DC
  Administered 2021-10-18 – 2021-10-19 (×3): 30 mg via INTRAVENOUS
  Filled 2021-10-18 (×3): qty 4

## 2021-10-18 MED ORDER — HYDROXYZINE HCL 10 MG PO TABS
10.0000 mg | ORAL_TABLET | Freq: Three times a day (TID) | ORAL | Status: DC | PRN
  Administered 2021-10-18: 10 mg via ORAL
  Filled 2021-10-18: qty 1

## 2021-10-18 NOTE — TOC Progression Note (Signed)
  Transition of Care [facility]) Screening Note   Patient Details  Name: [name] [name] Date of Birth: January 08, 1956   Transition of [facility]) CM/SW Contact:    Shade Flood, LCSW Phone Number: 10/18/2021, 11:10 AM    CHF educational book ordered yesterday for pt's review.  Transition of Care Department [facility]) has reviewed patient and no TOC needs have been identified at this time. We will continue to monitor patient advancement through interdisciplinary progression rounds. If new patient transition needs arise, please place a TOC consult.

## 2021-10-18 NOTE — Progress Notes (Signed)
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  He reports that he had been prescribed to take torsemide as needed but has not taken it in quite some time.  He also reports he is not taking his Entresto or spironolactone.  He complains of shortness of breath and fatigue.  He denies chest pain.  His symptoms of shortness of breath have worsened in the past 2 days and he has had a dry cough.  His chest x-ray shows findings of pleural effusions and CHF.  He was given IV Lasix for diuresis and admission was requested for further management.   Assessment and Plan: * Acute HFrEF (heart failure with reduced ejection fraction) (HCC) - Secondary to poor compliance with taking medications - Continue IV Lasix for now - Reports not taking torsemide at home or other diuretics - Not regularly following up with advanced heart failure clinic - TTEwith EF<20%  - ReDS vest reading requested: 37 - Heart failure protocol initiated - Monitor intake and output Daily weights and electrolytes  Hypokalemia - Replacement ordered, check magnesium and replete as needed  Chronic renal failure, stage 3a ([place]) - Follow renal function with diuresis  Chronic pain - Pain management ordered as needed  Hyperlipidemia - Resume home simvastatin  Generalized anxiety disorder -  Currently stable, following  Cardiomyopathy, dilated, nonischemic ([place]) - Patient not taking Entresto any longer - Follow-up TTE - We will request cardiology consultation on 10/2  Hypertension - Resume home metoprolol  Type 2 diabetes with nephropathy (HCC) - SSI coverage with CBG monitoring and prandial coverage ordered -Follow-up hemoglobin P3I  Chronic systolic heart failure ([place]) - TTE with worsened EF <20%   DVT prophylaxis: apixaban Code Status: full  Family Communication:  Disposition: Status is: Inpatient Remains inpatient appropriate because: IV furosemide required    Consultants:  Cardiology 10/2> Procedures:   Antimicrobials:    Subjective: Pt reports ongoing SOB symptoms.  No CP.   Objective: Vitals:   10/17/21 2345 10/18/21 0345 10/18/21 0526 10/18/21 1230  BP: 111/85 114/83  98/67  Pulse: (!) 52 (!) 50  (!) 59  Resp: '18 18  17  '$ Temp: 98.1 F (36.7 C) 98.1 F (36.7 C)  98.4 F (36.9 C)  TempSrc: Oral Oral    SpO2: 96% 99%  90%  Weight:   89.9 kg   Height:        Intake/Output Summary (Last 24 hours) at 10/18/2021 1332 Last data filed at 10/18/2021 1100 Gross per 24 hour  Intake 337.57 ml  Output 750 ml  Net -412.43 ml   Filed Weights   10/17/21 0622 10/18/21 0526  Weight: 93.9 kg 89.9 kg   Examination:  General exam: Appears calm and comfortable  Respiratory system: Clear to auscultation. Respiratory effort normal. Cardiovascular system: normal S1 & S2 heard. mild JVD. 1++ pedal edema. Gastrointestinal system: Abdomen is nondistended, soft and nontender. No organomegaly or masses felt. Normal bowel sounds  heard. Central nervous system: Alert and oriented. No focal neurological deficits. Extremities: Symmetric 5 x 5 power. Skin: No rashes, lesions or ulcers. Psychiatry: Judgement and insight appear normal. Mood & affect appropriate.   Data Reviewed: I have personally reviewed following labs and imaging studies  CBC: Recent Labs  Lab  10/17/21 0647  WBC 7.0  HGB 11.9*  HCT 37.0*  MCV 107.9*  PLT 761    Basic Metabolic Panel: Recent Labs  Lab 10/17/21 0647 10/17/21 1218 10/18/21 0630  NA 141  --  139  K 3.2*  --  3.7  CL 108  --  105  CO2 25  --  26  GLUCOSE 145*  --  136*  BUN 17  --  16  CREATININE 1.62*  --  1.61*  CALCIUM 7.8*  --  8.0*  MG  --  1.6* 2.5*    CBG: Recent Labs  Lab 10/17/21 1142 10/17/21 1612 10/17/21 2104 10/18/21 0710  GLUCAP 115* 118* 160* 185*    No results found for this or any previous visit (from the past 240 hour(s)).   Radiology Studies: ECHOCARDIOGRAM COMPLETE  Result Date: 10/17/2021    ECHOCARDIOGRAM REPORT   Patient Name:   [name] [name] Date of Exam: 10/17/2021 Medical Rec #:  [id number]        Height:       72.0 in Accession #:    [id number]       Weight:       207.0 lb Date of Birth:  1955-08-21       BSA:          2.162 m Patient Age:    41 years         BP:           125/79 mmHg Patient Gender: M                HR:           64 bpm. Exam Location:  [facility] Procedure: 2D Echo, Cardiac Doppler and Color Doppler Indications:    Congestive Heart Failure I50.9  History:        Patient has prior history of Echocardiogram examinations, most                 recent 11/25/2020. Cardiomyopathy, Arrythmias:Atrial                 Fibrillation; Risk Factors:Hypertension, Diabetes and                 Dyslipidemia. Acute HFrEF (heart failure with reduced ejection                 fraction), Fluid retention in legs (From Hx).  Sonographer:    Alvino Chapel RCS Referring Phys: [place]  1. Contrast demonstrates sluggish flow, but no thrombus. Left ventricular ejection fraction, by estimation, is <20%. Left ventricular ejection fraction by 2D MOD biplane is 19.3 %. The left ventricle has severely decreased function. The left ventricle demonstrates global hypokinesis. The left ventricular internal cavity size was mildly dilated. There is moderate left  ventricular hypertrophy. Left ventricular diastolic parameters are consistent with Grade III diastolic dysfunction (restrictive). Elevated left ventricular end-diastolic pressure.  2. Right ventricular systolic function is moderately reduced. The right ventricular size is normal. There is moderately elevated pulmonary artery systolic pressure. The estimated right ventricular systolic pressure is 35.0 mmHg.  3. Left atrial size was severely dilated.  4. The mitral valve  is abnormal. Mild to moderate mitral valve regurgitation.  5. The aortic valve is tricuspid. Aortic valve regurgitation is not visualized.  6. The inferior vena cava is normal in size with <50% respiratory variability, suggesting right atrial pressure of 8 mmHg. Comparison(s): Changes from prior study are noted. 11/25/2020: LVEF 25-30%. FINDINGS  Left Ventricle: Contrast demonstrates sluggish flow, but no thrombus. Left ventricular ejection fraction, by estimation, is <20%. Left ventricular ejection fraction by 2D MOD biplane is 19.3 %. The left ventricle has severely decreased function. The left ventricle demonstrates global hypokinesis. Definity contrast agent was given IV to delineate the left ventricular endocardial borders. The left ventricular internal cavity size was mildly dilated. There is moderate left ventricular hypertrophy. Left  ventricular diastolic parameters are consistent with Grade III diastolic dysfunction (restrictive). Elevated left ventricular end-diastolic pressure. Right Ventricle: The right ventricular size is normal. No increase in right ventricular wall thickness. Right ventricular systolic function is moderately reduced. There is moderately elevated pulmonary artery systolic pressure. The tricuspid regurgitant velocity is 3.16 m/s, and with an assumed right atrial pressure of 8 mmHg, the estimated right ventricular systolic pressure is 93.2 mmHg. Left Atrium: Left atrial size was severely dilated. Right Atrium: Right atrial  size was normal in size. Pericardium: There is no evidence of pericardial effusion. Mitral Valve: The mitral valve is abnormal. There is mild calcification of the anterior and posterior mitral valve leaflet(s). Mild to moderate mitral valve regurgitation. Tricuspid Valve: The tricuspid valve is grossly normal. Tricuspid valve regurgitation is mild. Aortic Valve: The aortic valve is tricuspid. Aortic valve regurgitation is not visualized. Pulmonic Valve: The pulmonic valve was grossly normal. Pulmonic valve regurgitation is mild. Aorta: The aortic root and ascending aorta are structurally normal, with no evidence of dilitation. Venous: The inferior vena cava is normal in size with less than 50% respiratory variability, suggesting right atrial pressure of 8 mmHg. IAS/Shunts: No atrial level shunt detected by color flow Doppler.  LEFT VENTRICLE PLAX 2D                        Biplane EF (MOD) LVIDd:         5.80 cm         LV Biplane EF:   Left LVIDs:         5.50 cm                          ventricular LV PW:         1.30 cm                          ejection LV IVS:        1.40 cm                          fraction by LVOT diam:     2.00 cm                          2D MOD LV SV:         36                               biplane is LV SV Index:   17  19.3 %. LVOT Area:     3.14 cm                                Diastology                                LV e' medial:    3.24 cm/s LV Volumes (MOD)               LV E/e' medial:  33.6 LV vol d, MOD    204.0 ml      LV e' lateral:   5.43 cm/s A2C:                           LV E/e' lateral: 20.1 LV vol d, MOD    169.0 ml A4C: LV vol s, MOD    165.0 ml A2C: LV vol s, MOD    141.0 ml A4C: LV SV MOD A2C:   39.0 ml LV SV MOD A4C:   169.0 ml LV SV MOD BP:    37.4 ml RIGHT VENTRICLE RV S prime:     7.62 cm/s TAPSE (M-mode): 2.3 cm LEFT ATRIUM              Index        RIGHT ATRIUM           Index LA diam:        4.60 cm  2.13 cm/m   RA Area:     17.00  cm LA Vol (A2C):   99.6 ml  46.07 ml/m  RA Volume:   51.30 ml  23.73 ml/m LA Vol (A4C):   127.0 ml 58.74 ml/m LA Biplane Vol: 123.0 ml 56.89 ml/m  AORTIC VALVE LVOT Vmax:   68.50 cm/s LVOT Vmean:  40.200 cm/s LVOT VTI:    0.116 m  AORTA Ao Root diam: 3.50 cm MITRAL VALVE                TRICUSPID VALVE MV Area (PHT): 4.06 cm     TR Peak grad:   39.9 mmHg MV Decel Time: 187 msec     TR Vmax:        316.00 cm/s MR Peak grad: 62.1 mmHg MR Mean grad: 40.0 mmHg     SHUNTS MR Vmax:      394.00 cm/s   Systemic VTI:  0.12 m MR Vmean:     293.0 cm/s    Systemic Diam: 2.00 cm MV E velocity: 109.00 cm/s MV A velocity: 25.70 cm/s MV E/A ratio:  4.24 Lyman Bishop MD Electronically signed by Lyman Bishop MD Signature Date/Time: 10/17/2021/12:50:48 PM    Final    DG Chest Portable 1 View  Result Date: 10/17/2021 CLINICAL DATA:  Shortness of breath with history of CHF. Chest pain. EXAM: PORTABLE CHEST 1 VIEW COMPARISON:  09/14/2021 FINDINGS: Stable cardiac enlargement. Lungs are hypoinflated. Moderate right pleural effusion with veil like opacification of the right lower lobe has increased from previous exam. Pulmonary vascular congestion identified. IMPRESSION: 1. Progressive right pleural effusion and pulmonary vascular congestion concerning for CHF. Electronically Signed   By: Kerby Moors M.D.   On: 10/17/2021 06:51    Scheduled Meds:  amiodarone  100 mg Oral Daily   apixaban  5 mg Oral BID   furosemide  30 mg Intravenous Q12H  insulin aspart  0-9 Units Subcutaneous TID WC   insulin aspart  3 Units Subcutaneous TID WC   levothyroxine  137 mcg Oral q morning   magnesium oxide  200 mg Oral Daily   metoprolol succinate  50 mg Oral Daily   potassium chloride  40 mEq Oral Daily   simvastatin  40 mg Oral QHS   Continuous Infusions:   LOS: 1 day   Time spent: 36 mins  Khair Chasteen Wynetta Emery, MD How to contact the [facility] Attending or Consulting provider [place] or covering provider during after hours [place],  for this patient?  Check the care team in [facility] and look for a) attending/consulting [facility] provider listed and b) the [facility] team listed Log into [email or web address] and use [place]'s universal password to access. If you do not have the password, please contact the hospital operator. Locate the [facility] provider you are looking for under [facility] and page to a number that you can be directly reached. If you still have difficulty reaching the provider, please page the [facility] (Director on Call) for the Hospitalists listed on amion for assistance.  10/18/2021, 1:32 PM

## 2021-10-19 ENCOUNTER — Inpatient Hospital Stay (HOSPITAL_COMMUNITY): Payer: Medicare HMO

## 2021-10-19 DIAGNOSIS — I5021 Acute systolic (congestive) heart failure: Secondary | ICD-10-CM | POA: Diagnosis not present

## 2021-10-19 DIAGNOSIS — I420 Dilated cardiomyopathy: Secondary | ICD-10-CM | POA: Diagnosis not present

## 2021-10-19 DIAGNOSIS — N1831 Chronic kidney disease, stage 3a: Secondary | ICD-10-CM | POA: Diagnosis not present

## 2021-10-19 DIAGNOSIS — I5022 Chronic systolic (congestive) heart failure: Secondary | ICD-10-CM | POA: Diagnosis not present

## 2021-10-19 LAB — BRAIN NATRIURETIC PEPTIDE: B Natriuretic Peptide: 2237 pg/mL — ABNORMAL HIGH (ref 0.0–100.0)

## 2021-10-19 LAB — BASIC METABOLIC PANEL
Anion gap: 9 (ref 5–15)
BUN: 21 mg/dL (ref 8–23)
CO2: 27 mmol/L (ref 22–32)
Calcium: 8.6 mg/dL — ABNORMAL LOW (ref 8.9–10.3)
Chloride: 103 mmol/L (ref 98–111)
Creatinine, Ser: 1.58 mg/dL — ABNORMAL HIGH (ref 0.61–1.24)
GFR, Estimated: 49 mL/min — ABNORMAL LOW (ref >60)
Glucose, Bld: 163 mg/dL — ABNORMAL HIGH (ref 70–99)
Potassium: 3.2 mmol/L — ABNORMAL LOW (ref 3.5–5.1)
Sodium: 139 mmol/L (ref 135–145)

## 2021-10-19 LAB — CBC
HCT: 37.8 % — ABNORMAL LOW (ref 39.0–52.0)
Hemoglobin: 12.5 g/dL — ABNORMAL LOW (ref 13.0–17.0)
MCH: 34.8 pg — ABNORMAL HIGH (ref 26.0–34.0)
MCHC: 33.1 g/dL (ref 30.0–36.0)
MCV: 105.3 fL — ABNORMAL HIGH (ref 80.0–100.0)
Platelets: 237 10*3/uL (ref 150–400)
RBC: 3.59 MIL/uL — ABNORMAL LOW (ref 4.22–5.81)
RDW: 13.5 % (ref 11.5–15.5)
WBC: 7.9 10*3/uL (ref 4.0–10.5)
nRBC: 0 % (ref 0.0–0.2)

## 2021-10-19 LAB — GLUCOSE, CAPILLARY
Glucose-Capillary: 116 mg/dL — ABNORMAL HIGH (ref 70–99)
Glucose-Capillary: 116 mg/dL — ABNORMAL HIGH (ref 70–99)
Glucose-Capillary: 229 mg/dL — ABNORMAL HIGH (ref 70–99)

## 2021-10-19 LAB — MAGNESIUM: Magnesium: 2 mg/dL (ref 1.7–2.4)

## 2021-10-19 MED ORDER — POTASSIUM CHLORIDE CRYS ER 20 MEQ PO TBCR
40.0000 meq | EXTENDED_RELEASE_TABLET | Freq: Once | ORAL | Status: AC
  Administered 2021-10-19: 40 meq via ORAL
  Filled 2021-10-19: qty 2

## 2021-10-19 MED ORDER — SACUBITRIL-VALSARTAN 24-26 MG PO TABS
1.0000 | ORAL_TABLET | Freq: Two times a day (BID) | ORAL | Status: DC
  Administered 2021-10-19: 1 via ORAL
  Filled 2021-10-19: qty 1

## 2021-10-19 MED ORDER — SPIRONOLACTONE 25 MG PO TABS: 12.5000 mg | ORAL_TABLET | Freq: Every day | ORAL | 1 refills | Status: DC

## 2021-10-19 MED ORDER — DAPAGLIFLOZIN PROPANEDIOL 10 MG PO TABS
10.0000 mg | ORAL_TABLET | Freq: Every day | ORAL | Status: DC
  Administered 2021-10-19: 10 mg via ORAL
  Filled 2021-10-19: qty 1

## 2021-10-19 MED ORDER — POTASSIUM CHLORIDE CRYS ER 20 MEQ PO TBCR: 20.0000 meq | EXTENDED_RELEASE_TABLET | Freq: Every day | ORAL | 1 refills | Status: DC

## 2021-10-19 MED ORDER — EMPAGLIFLOZIN 10 MG PO TABS: 10.0000 mg | ORAL_TABLET | Freq: Every day | ORAL | Status: DC

## 2021-10-19 MED ORDER — SIMVASTATIN 20 MG PO TABS: 20.0000 mg | ORAL_TABLET | Freq: Every day | ORAL | 1 refills | Status: DC

## 2021-10-19 MED ORDER — TORSEMIDE 20 MG PO TABS: 20.0000 mg | ORAL_TABLET | Freq: Every day | ORAL | 1 refills | Status: DC | PRN

## 2021-10-19 MED ORDER — ENTRESTO 24-26 MG PO TABS: 1.0000 | ORAL_TABLET | Freq: Two times a day (BID) | ORAL | 1 refills | Status: DC

## 2021-10-19 MED ORDER — SPIRONOLACTONE 12.5 MG HALF TABLET
12.5000 mg | ORAL_TABLET | Freq: Every day | ORAL | Status: DC
  Administered 2021-10-19: 12.5 mg via ORAL
  Filled 2021-10-19: qty 1

## 2021-10-19 NOTE — Discharge Summary (Addendum)
Physician Discharge Summary  [name] [name] ZDG:[id number] DOB: October 23, 1955 DOA: 10/17/2021  PCP: [name], NP Cardiology: [facility]   Admit date: 10/17/2021 Discharge date: 10/19/2021  Admitted From:  HOME  Disposition: HOME   Recommendations for Outpatient Follow-up:  Follow up with PCP in 1 weeks Follow up with [facility] in 1-2 weeks Please obtain BMP in 1-2 weeks  Discharge Condition: STABLE   CODE STATUS: FULL  DIET: 2 gm sodium carb modified no concentrated sweets   Brief Hospitalization Summary: Please see all hospital notes, images, labs for full details of the hospitalization. 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  He reports that he had been prescribed to take torsemide as needed but has not taken it in quite some time.  He also reports he is not taking his Entresto or spironolactone.  He complains of shortness of breath and fatigue.  He denies chest pain.  His symptoms of shortness of breath have worsened in the past 2 days and he has had a dry cough.  His chest x-ray shows findings of pleural effusions and CHF.  He was given IV Lasix for diuresis and admission was requested for further management.  HOSPITAL COURSE BY PROBLEM LIST  Assessment and Plan: * Acute HFrEF (heart failure with reduced ejection fraction) ([place]) - Secondary to poor compliance with taking medications - Continue IV Lasix for now - Reports not taking torsemide at home or other diuretics - Not regularly following up with advanced heart failure  clinic - TTEwith EF<20%  - ReDS vest reading requested: 37 - Heart failure protocol initiated - Monitor intake and output Daily weights and electrolytes   Appreciate cardiology consult:  Pt now appears euvolemic He has been restarted on his home cardiac meds and cardiology is going to assist him in getting his meds.  They said that he can discharge today. He is ambulating well.  Feeling his baseline.  He has outpatient follow up with [facility] [place] [facility]. DC home today.  New Rxs sent.    Hypokalemia - Replacement ordered and repleted; recommend repeat BMP in 1-2 weeks on outpatient follow up   Chronic renal failure, stage 3a ([place]) - Follow renal function with diuresis  Chronic pain - Pain management ordered as needed  Hyperlipidemia - Resume home simvastatin  Generalized anxiety disorder - Currently stable, following  Cardiomyopathy, dilated, nonischemic (HCC) - Patient not taking Entresto when he was admitted - Follow-up TTE: EF<20%  - Outpatient cardiology follow up with [facility] [place] - see cardiology consult notes and recommendations  Hypertension - Resume home metoprolol  Type 2 diabetes with nephropathy ([place]) - SSI coverage with CBG monitoring and prandial coverage ordered while in hospital  - resume home medication and close outpatient follow up   Chronic systolic heart failure ([place]) - TTE with worsened EF <20%  Discharge Diagnoses:  Principal Problem:   Acute HFrEF (heart failure with reduced ejection fraction) ([place]) Active Problems:   Chronic systolic heart failure ([place])   Type 2 diabetes with nephropathy ([place])   Hypertension   Cardiomyopathy, dilated, nonischemic (HCC)   Generalized anxiety disorder   Hyperlipidemia   Chronic pain  Chronic renal failure, stage 3a (HCC)   Hypokalemia   Discharge Instructions: Discharge Instructions     Ambulatory referral to Cardiology   Complete by: As directed       Allergies as of 10/19/2021   No Known  Allergies      Medication List     TAKE these medications    amiodarone 200 MG tablet Commonly known as: PACERONE Take 0.5 tablets (100 mg total) by mouth daily.   dapagliflozin propanediol 10 MG Tabs tablet Commonly known as: FARXIGA Take 1 tablet (10 mg total) by mouth daily.   Eliquis 5 MG Tabs tablet Generic drug: apixaban TAKE ONE TABLET BY MOUTH 2 TIMES A DAY   Entresto 24-26 MG Generic drug: sacubitril-valsartan Take 1 tablet by mouth 2 (two) times daily.   gabapentin 100 MG capsule Commonly known as: NEURONTIN Take 200 mg by mouth 3 (three) times daily.   levothyroxine 137 MCG tablet Commonly known as: SYNTHROID Take 137 mcg by mouth every morning.   Magnesium 250 MG Tabs Take 250 mg by mouth daily.   metFORMIN 500 MG tablet Commonly known as: GLUCOPHAGE Take 500 mg by mouth 2 (two) times daily.   metoprolol succinate 50 MG 24 hr tablet Commonly known as: TOPROL-XL Take 1 tablet (50 mg total) by mouth 2 (two) times daily.   Oxycodone HCl 10 MG Tabs Take 10 mg by mouth 4 (four) times daily as needed (for pain).   potassium chloride SA 20 MEQ tablet Commonly known as: KLOR-CON M Take 1 tablet (20 mEq total) by mouth daily.   simvastatin 20 MG tablet Commonly known as: ZOCOR Take 1 tablet (20 mg total) by mouth at bedtime. What changed:  medication strength how much to take   spironolactone 25 MG tablet Commonly known as: ALDACTONE Take 0.5 tablets (12.5 mg total) by mouth daily. What changed: how much to take   torsemide 20 MG tablet Commonly known as: DEMADEX Take 1 tablet (20 mg total) by mouth daily as needed (fluid).   True Metrix Blood Glucose Test test strip Generic drug: glucose blood   True Metrix Level 1 Low Soln   True Metrix Meter w/Device Kit   TRUEplus Lancets 33G Misc        Follow-up Information     [facility] at [place]. [place]. Schedule an appointment as soon as possible for  a visit in 1 week(s).   Specialty: Cardiology Why: Hospital Follow Up Contact information: [address] [id number] mc [place]        [name], NP. Schedule an appointment as soon as possible for a visit in 2 week(s).   Why: Hospital Follow Up Contact information: [place] [address] [phone]                No Known Allergies Allergies as of 10/19/2021   No Known Allergies      Medication List     TAKE these medications    amiodarone 200 MG tablet Commonly known as: PACERONE Take 0.5 tablets (100 mg total) by mouth daily.   dapagliflozin propanediol 10 MG Tabs tablet Commonly known as: FARXIGA Take 1 tablet (10 mg total) by mouth daily.   Eliquis 5 MG Tabs tablet Generic drug: apixaban TAKE ONE TABLET BY MOUTH 2 TIMES A DAY   Entresto 24-26 MG Generic drug: sacubitril-valsartan Take 1 tablet by mouth 2 (two) times daily.   gabapentin  100 MG capsule Commonly known as: NEURONTIN Take 200 mg by mouth 3 (three) times daily.   levothyroxine 137 MCG tablet Commonly known as: SYNTHROID Take 137 mcg by mouth every morning.   Magnesium 250 MG Tabs Take 250 mg by mouth daily.   metFORMIN 500 MG tablet Commonly known as: GLUCOPHAGE Take 500 mg by mouth 2 (two) times daily.   metoprolol succinate 50 MG 24 hr tablet Commonly known as: TOPROL-XL Take 1 tablet (50 mg total) by mouth 2 (two) times daily.   Oxycodone HCl 10 MG Tabs Take 10 mg by mouth 4 (four) times daily as needed (for pain).   potassium chloride SA 20 MEQ tablet Commonly known as: KLOR-CON M Take 1 tablet (20 mEq total) by mouth daily.   simvastatin 20 MG tablet Commonly known as: ZOCOR Take 1 tablet (20 mg total) by mouth at bedtime. What changed:  medication strength how much to take   spironolactone 25 MG tablet Commonly known as: ALDACTONE Take 0.5 tablets (12.5 mg total) by mouth daily. What changed: how  much to take   torsemide 20 MG tablet Commonly known as: DEMADEX Take 1 tablet (20 mg total) by mouth daily as needed (fluid).   True Metrix Blood Glucose Test test strip Generic drug: glucose blood   True Metrix Level 1 Low Soln   True Metrix Meter w/Device Kit   TRUEplus Lancets 33G Misc        Procedures/Studies: Portable chest 1 View  Result Date: 10/19/2021 CLINICAL DATA:  66 year old male with history of acute onset of heart failure and shortness of breath. EXAM: PORTABLE CHEST 1 VIEW COMPARISON:  Chest x-ray 10/17/2021. FINDINGS: Moderate right pleural effusion with opacity at the right base which may reflect atelectasis and/or consolidation. Left lung is clear. No left pleural effusion. No pneumothorax. No evidence of pulmonary edema. Heart size is normal. Upper mediastinal contours are within normal limits. IMPRESSION: 1. Atelectasis and/or consolidation in the right lung base with moderate right pleural effusion. Followup PA and lateral chest X-ray is recommended in 3-4 weeks following trial of antibiotic therapy to ensure resolution and exclude underlying malignancy. Electronically Signed   By: Vinnie Langton M.D.   On: 10/19/2021 05:23   ECHOCARDIOGRAM COMPLETE  Result Date: 10/17/2021    ECHOCARDIOGRAM REPORT   Patient Name:   BLAYDEN CONWELL Henckel Date of Exam: 10/17/2021 Medical Rec #:  [id number]        Height:       72.0 in Accession #:    [id number]       Weight:       207.0 lb Date of Birth:  [birth year]       BSA:          2.162 m Patient Age:    33 years         BP:           125/79 mmHg Patient Gender: M                HR:           64 bpm. Exam Location:  [facility] Procedure: 2D Echo, Cardiac Doppler and Color Doppler Indications:    Congestive Heart Failure I50.9  History:        Patient has prior history of Echocardiogram examinations, most                 recent 11/25/2020. Cardiomyopathy, Arrythmias:Atrial  Fibrillation; Risk Factors:Hypertension,  Diabetes and                 Dyslipidemia. Acute HFrEF (heart failure with reduced ejection                 fraction), Fluid retention in legs (From Hx).  Sonographer:    Alvino Chapel RCS Referring Phys: [place]  1. Contrast demonstrates sluggish flow, but no thrombus. Left ventricular ejection fraction, by estimation, is <20%. Left ventricular ejection fraction by 2D MOD biplane is 19.3 %. The left ventricle has severely decreased function. The left ventricle demonstrates global hypokinesis. The left ventricular internal cavity size was mildly dilated. There is moderate left ventricular hypertrophy. Left ventricular diastolic parameters are consistent with Grade III diastolic dysfunction (restrictive). Elevated left ventricular end-diastolic pressure.  2. Right ventricular systolic function is moderately reduced. The right ventricular size is normal. There is moderately elevated pulmonary artery systolic pressure. The estimated right ventricular systolic pressure is 91.5 mmHg.  3. Left atrial size was severely dilated.  4. The mitral valve is abnormal. Mild to moderate mitral valve regurgitation.  5. The aortic valve is tricuspid. Aortic valve regurgitation is not visualized.  6. The inferior vena cava is normal in size with <50% respiratory variability, suggesting right atrial pressure of 8 mmHg. Comparison(s): Changes from prior study are noted. 11/25/2020: LVEF 25-30%. FINDINGS  Left Ventricle: Contrast demonstrates sluggish flow, but no thrombus. Left ventricular ejection fraction, by estimation, is <20%. Left ventricular ejection fraction by 2D MOD biplane is 19.3 %. The left ventricle has severely decreased function. The left ventricle demonstrates global hypokinesis. Definity contrast agent was given IV to delineate the left ventricular endocardial borders. The left ventricular internal cavity size was mildly dilated. There is moderate left ventricular hypertrophy. Left   ventricular diastolic parameters are consistent with Grade III diastolic dysfunction (restrictive). Elevated left ventricular end-diastolic pressure. Right Ventricle: The right ventricular size is normal. No increase in right ventricular wall thickness. Right ventricular systolic function is moderately reduced. There is moderately elevated pulmonary artery systolic pressure. The tricuspid regurgitant velocity is 3.16 m/s, and with an assumed right atrial pressure of 8 mmHg, the estimated right ventricular systolic pressure is 05.6 mmHg. Left Atrium: Left atrial size was severely dilated. Right Atrium: Right atrial size was normal in size. Pericardium: There is no evidence of pericardial effusion. Mitral Valve: The mitral valve is abnormal. There is mild calcification of the anterior and posterior mitral valve leaflet(s). Mild to moderate mitral valve regurgitation. Tricuspid Valve: The tricuspid valve is grossly normal. Tricuspid valve regurgitation is mild. Aortic Valve: The aortic valve is tricuspid. Aortic valve regurgitation is not visualized. Pulmonic Valve: The pulmonic valve was grossly normal. Pulmonic valve regurgitation is mild. Aorta: The aortic root and ascending aorta are structurally normal, with no evidence of dilitation. Venous: The inferior vena cava is normal in size with less than 50% respiratory variability, suggesting right atrial pressure of 8 mmHg. IAS/Shunts: No atrial level shunt detected by color flow Doppler.  LEFT VENTRICLE PLAX 2D                        Biplane EF (MOD) LVIDd:         5.80 cm         LV Biplane EF:   Left LVIDs:         5.50 cm  ventricular LV PW:         1.30 cm                          ejection LV IVS:        1.40 cm                          fraction by LVOT diam:     2.00 cm                          2D MOD LV SV:         36                               biplane is LV SV Index:   17                               19.3 %. LVOT Area:     3.14 cm                                 Diastology                                LV e' medial:    3.24 cm/s LV Volumes (MOD)               LV E/e' medial:  33.6 LV vol d, MOD    204.0 ml      LV e' lateral:   5.43 cm/s A2C:                           LV E/e' lateral: 20.1 LV vol d, MOD    169.0 ml A4C: LV vol s, MOD    165.0 ml A2C: LV vol s, MOD    141.0 ml A4C: LV SV MOD A2C:   39.0 ml LV SV MOD A4C:   169.0 ml LV SV MOD BP:    37.4 ml RIGHT VENTRICLE RV S prime:     7.62 cm/s TAPSE (M-mode): 2.3 cm LEFT ATRIUM              Index        RIGHT ATRIUM           Index LA diam:        4.60 cm  2.13 cm/m   RA Area:     17.00 cm LA Vol (A2C):   99.6 ml  46.07 ml/m  RA Volume:   51.30 ml  23.73 ml/m LA Vol (A4C):   127.0 ml 58.74 ml/m LA Biplane Vol: 123.0 ml 56.89 ml/m  AORTIC VALVE LVOT Vmax:   68.50 cm/s LVOT Vmean:  40.200 cm/s LVOT VTI:    0.116 m  AORTA Ao Root diam: 3.50 cm MITRAL VALVE                TRICUSPID VALVE MV Area (PHT): 4.06 cm     TR Peak grad:   39.9 mmHg MV Decel Time: 187 msec     TR Vmax:        316.00 cm/s MR Peak grad: 62.1  mmHg MR Mean grad: 40.0 mmHg     SHUNTS MR Vmax:      394.00 cm/s   Systemic VTI:  0.12 m MR Vmean:     293.0 cm/s    Systemic Diam: 2.00 cm MV E velocity: 109.00 cm/s MV A velocity: 25.70 cm/s MV E/A ratio:  4.24 Lyman Bishop MD Electronically signed by Lyman Bishop MD Signature Date/Time: 10/17/2021/12:50:48 PM    Final    DG Chest Portable 1 View  Result Date: 10/17/2021 CLINICAL DATA:  Shortness of breath with history of CHF. Chest pain. EXAM: PORTABLE CHEST 1 VIEW COMPARISON:  09/14/2021 FINDINGS: Stable cardiac enlargement. Lungs are hypoinflated. Moderate right pleural effusion with veil like opacification of the right lower lobe has increased from previous exam. Pulmonary vascular congestion identified. IMPRESSION: 1. Progressive right pleural effusion and pulmonary vascular congestion concerning for CHF. Electronically Signed   By: Kerby Moors M.D.   On:  10/17/2021 06:51     Subjective: Pt reports that he is breathing so much better today.  No complaints.    Discharge Exam: Vitals:   10/19/21 1302 10/19/21 1319  BP: 105/71   Pulse: 61   Resp: 20   Temp: 98.2 F (36.8 C)   SpO2: 95% 95%   Vitals:   10/19/21 0433 10/19/21 0500 10/19/21 1302 10/19/21 1319  BP: 103/78  105/71   Pulse: 61  61   Resp: 19  20   Temp: 98.6 F (37 C)  98.2 F (36.8 C)   TempSrc:   Oral   SpO2: 96%  95% 95%  Weight:  90 kg    Height:  6' (1.829 m)     General exam: Appears calm and comfortable  Respiratory system: Clear to auscultation. Respiratory effort normal. Cardiovascular system: normal S1 & S2 heard. mild JVD. no pedal edema. Gastrointestinal system: Abdomen is nondistended, soft and nontender. No organomegaly or masses felt. Normal bowel sounds heard. Central nervous system: Alert and oriented. No focal neurological deficits. Extremities: Symmetric 5 x 5 power. Skin: No rashes, lesions or ulcers. Psychiatry: Judgement and insight appear poor. Mood & affect appropriate.     The results of significant diagnostics from this hospitalization (including imaging, microbiology, ancillary and laboratory) are listed below for reference.     Microbiology: No results found for this or any previous visit (from the past 240 hour(s)).   Labs: BNP (last 3 results) Recent Labs    10/17/21 0647 10/18/21 0630 10/19/21 0552  BNP 2,970.0* 2,652.0* 1,583.0*   Basic Metabolic Panel: Recent Labs  Lab 10/17/21 0647 10/17/21 1218 10/18/21 0630 10/19/21 0552  NA 141  --  139 139  K 3.2*  --  3.7 3.2*  CL 108  --  105 103  CO2 25  --  26 27  GLUCOSE 145*  --  136* 163*  BUN 17  --  16 21  CREATININE 1.62*  --  1.61* 1.58*  CALCIUM 7.8*  --  8.0* 8.6*  MG  --  1.6* 2.5* 2.0   Liver Function Tests: No results for input(s): "AST", "ALT", "ALKPHOS", "BILITOT", "PROT", "ALBUMIN" in the last 168 hours. No results for input(s): "LIPASE", "AMYLASE"  in the last 168 hours. No results for input(s): "AMMONIA" in the last 168 hours. CBC: Recent Labs  Lab 10/17/21 0647 10/19/21 0552  WBC 7.0 7.9  HGB 11.9* 12.5*  HCT 37.0* 37.8*  MCV 107.9* 105.3*  PLT 237 237   Cardiac Enzymes: No results for input(s): "CKTOTAL", "CKMB", "CKMBINDEX", "  TROPONINI" in the last 168 hours. BNP: Invalid input(s): "POCBNP" CBG: Recent Labs  Lab 10/18/21 1617 10/18/21 2018 10/19/21 0431 10/19/21 0753 10/19/21 1129  GLUCAP 74 143* 116* 116* 229*   D-Dimer No results for input(s): "DDIMER" in the last 72 hours. Hgb A1c Recent Labs    10/17/21 1048  HGBA1C 6.1*   Lipid Profile No results for input(s): "CHOL", "HDL", "LDLCALC", "TRIG", "CHOLHDL", "LDLDIRECT" in the last 72 hours. Thyroid function studies No results for input(s): "TSH", "T4TOTAL", "T3FREE", "THYROIDAB" in the last 72 hours.  Invalid input(s): "FREET3" Anemia work up No results for input(s): "VITAMINB12", "FOLATE", "FERRITIN", "TIBC", "IRON", "RETICCTPCT" in the last 72 hours. Urinalysis    Component Value Date/Time   COLORURINE YELLOW 06/28/2006 1102   APPEARANCEUR CLEAR 06/28/2006 1102   LABSPEC 1.020 06/28/2006 1102   PHURINE 6.0 06/28/2006 1102   GLUCOSEU NEGATIVE 06/28/2006 1102   HGBUR NEGATIVE 06/28/2006 1102   BILIRUBINUR NEGATIVE 06/28/2006 1102   KETONESUR NEGATIVE 06/28/2006 1102   PROTEINUR NEGATIVE 06/28/2006 1102   UROBILINOGEN 1.0 06/28/2006 1102   NITRITE NEGATIVE 06/28/2006 1102   LEUKOCYTESUR  06/28/2006 1102    NEGATIVE MICROSCOPIC NOT DONE ON URINES WITH NEGATIVE PROTEIN, BLOOD, LEUKOCYTES, NITRITE, OR GLUCOSE <1000 mg/dL.   Sepsis Labs Recent Labs  Lab 10/17/21 0647 10/19/21 0552  WBC 7.0 7.9   Microbiology No results found for this or any previous visit (from the past 240 hour(s)).  Time coordinating discharge: 36 mins   SIGNED:  Irwin Brakeman, MD  [facility] 10/19/2021, 1:43 PM How to contact the [facility] Attending or  Consulting provider [place] or covering provider during after hours [place], for this patient?  Check the care team in [facility] and look for a) attending/consulting [facility] provider listed and b) the [facility] team listed Log into [email or web address] and use [place]'s universal password to access. If you do not have the password, please contact the hospital operator. Locate the [facility] provider you are looking for under [facility] and page to a number that you can be directly reached. If you still have difficulty reaching the provider, please page the [facility] (Director on Call) for the Hospitalists listed on amion for assistance.

## 2021-10-19 NOTE — Care Management Important Message (Signed)
Important Message  Patient Details  Name: [name] [name] MRN: [id number] Date of Birth: 1955-01-27   Medicare Important Message Given:  N/A - LOS <3 / Initial given by admissions     Tommy Medal 10/19/2021, 1:34 PM

## 2021-10-19 NOTE — Consult Note (Signed)
Cardiology Consultation   Patient ID: [name] [name] MRN: [id number]; DOB: 11-18-1955  Admit date: 10/17/2021 Date of Consult: 10/19/2021  PCP:  [name], NP   [place] Providers Cardiologist:  Carlyle Dolly, MD  Advanced Heart Failure:  Loralie Champagne, MD       Patient Profile:   [name] [name] is a 67 y.o. male with a hx of NICM who is being seen 10/19/2021 for the evaluation of acute CHF at the request of Dr. Wynetta Emery.  History of Present Illness:   [name] [name]  with long history of cardiomyopathy, paroxysmal atrial fibrillation, and CKD stage 3 was referred by Dr. Harl Bowie for evaluation of CHF. 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 Patient was diagnosed with CHF in 2013.  At that time, EF was 15%.  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  Since that time, repeat echoes have shown EF < 35%.  Most recent study was a cardiac MRI in 1/22.  This showed LV EF 26%.  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  Patient has no family history of sarcoidosis.  His father had CHF, he is not sure of etiology.  Patient was found to have an LV thrombus.  Initially on warfarin, now on Eliquis.  He has a history of paroxysmal AF, currently in NSR on amiodarone.  He is still smoking 1-2 cigarettes/day.  He drinks a couple beers/week.    LHC/RHC in 2/22 showed no significant CAD, low filling pressures, and low but not markedly low cardiac index. CT chest did not show definite evidence for pulmonary sarcoidosis.  Cardiac PET in 3/22 showed no active inflammation, no evidence for active cardiac sarcoidosis.    Echo 08/25/21 LVEF 30%, mild aortic valve thickening.  Patient admitted with CHF. He hasn't taken torsemide, entresto, spironolactone. BNP >2900.Echo yesterday LVEF<20%. ReDS vest 37. I/O's -762. He was getting patient assistance for entresto but it ran out after a  year and hasn't reapplied. Can't get to his PCP in  because he can't afford gas. Hasn't been able to get spironolactone for unknown reason. Feeling better this am.   Past Medical History:  Diagnosis Date   Anxiety    CHF (congestive heart failure) ([place])    a. EF 15% in 2013 with cath showing normal cors b. EF 30-35% by repeat echo in 2707   Chronic systolic heart failure ([place])    Diabetes mellitus    Fluid retention in legs    Hypertension     Past Surgical History:  Procedure Laterality Date   COLONOSCOPY  01/04/2012   Procedure: COLONOSCOPY;  Surgeon: Danie Binder, MD;  Location: [facility];  Service: Endoscopy;  Laterality: N/A;  1:30 PM   HERNIA REPAIR     LEFT AND RIGHT HEART CATHETERIZATION WITH CORONARY ANGIOGRAM N/A 07/09/2011   Procedure: LEFT AND RIGHT HEART CATHETERIZATION WITH CORONARY ANGIOGRAM;  Surgeon: Peter M Martinique, MD;  Location: [facility] CATH LAB;  Service: Cardiovascular;  Laterality: N/A;   RIGHT/LEFT HEART CATH AND CORONARY ANGIOGRAPHY N/A 02/25/2020   Procedure: RIGHT/LEFT HEART CATH AND CORONARY ANGIOGRAPHY;  Surgeon: Larey Dresser, MD;  Location: [facility] CV LAB;  Service: Cardiovascular;  Laterality: N/A;     Home Medications:  Prior to Admission medications   Medication Sig Start Date End Date Taking? Authorizing Provider  amiodarone (PACERONE) 200 MG tablet Take 0.5 tablets (100 mg total) by mouth daily. 06/01/21  Yes Larey Dresser, MD  apixaban (ELIQUIS) 5 MG TABS tablet TAKE ONE TABLET BY MOUTH 2 TIMES A DAY 06/30/21  Yes Branch, Alphonse Guild, MD  dapagliflozin propanediol (FARXIGA) 10 MG TABS tablet Take 1 tablet (10 mg total) by mouth daily. 09/29/21  Yes Larey Dresser, MD  gabapentin (NEURONTIN) 100 MG capsule Take 200 mg by mouth 3 (three) times daily. 11/12/19  Yes [provider]  levothyroxine (SYNTHROID) 137 MCG tablet Take 137 mcg by mouth every morning. 11/09/20  Yes [provider]  Magnesium 250 MG TABS Take 250  mg by mouth daily.   Yes [provider]  metFORMIN (GLUCOPHAGE) 500 MG tablet Take 500 mg by mouth 2 (two) times daily. 04/20/19  Yes [provider]  metoprolol succinate (TOPROL-XL) 50 MG 24 hr tablet Take 1 tablet (50 mg total) by mouth 2 (two) times daily. 10/28/20  Yes Larey Dresser, MD  Oxycodone HCl 10 MG TABS Take 10 mg by mouth 4 (four) times daily as needed (for pain).  04/11/19  Yes [provider]  torsemide (DEMADEX) 20 MG tablet Take 20 mg by mouth daily as needed (fluid).   Yes [provider]  Blood Glucose Calibration (TRUE METRIX LEVEL 1) Low SOLN  11/15/19   [provider]  Blood Glucose Monitoring Suppl (TRUE METRIX METER) w/Device KIT  11/15/19   [provider]  potassium chloride SA (KLOR-CON) 20 MEQ tablet Take 1 tablet (20 mEq total) by mouth daily. Patient not taking: Reported on 10/17/2021 03/25/20   Larey Dresser, MD  sacubitril-valsartan (ENTRESTO) 24-26 MG Take 1 tablet by mouth 2 (two) times daily. Patient not taking: Reported on 10/17/2021 09/29/21   Larey Dresser, MD  simvastatin (ZOCOR) 40 MG tablet Take 1 tablet (40 mg total) by mouth at bedtime. Patient not taking: Reported on 10/17/2021 08/15/14   Tysinger, Camelia Eng, PA-C  spironolactone (ALDACTONE) 25 MG tablet Take 1 tablet (25 mg total) by mouth daily. Patient not taking: Reported on 10/17/2021 04/28/20   Larey Dresser, MD  TRUE [facility] BLOOD GLUCOSE TEST test strip  12/26/19   [provider]  TRUEplus Lancets 33G MISC  11/15/19   [provider]    Inpatient Medications: Scheduled Meds:  amiodarone  100 mg Oral Daily   apixaban  5 mg Oral BID   furosemide  30 mg Intravenous Q12H   insulin aspart  0-9 Units Subcutaneous TID WC   insulin aspart  3 Units Subcutaneous TID WC   levothyroxine  137 mcg Oral q morning   magnesium oxide  200 mg Oral Daily   metoprolol succinate  50 mg Oral Daily   potassium chloride  40 mEq Oral Daily    potassium chloride  40 mEq Oral Once   sacubitril-valsartan  1 tablet Oral BID   simvastatin  20 mg Oral QHS   spironolactone  12.5 mg Oral Daily   Continuous Infusions:  PRN Meds: acetaminophen **OR** acetaminophen, albuterol, bisacodyl, hydrOXYzine, nicotine, ondansetron **OR** ondansetron (ZOFRAN) IV, oxyCODONE  Allergies:   No Known Allergies  Social History:   Social History   Socioeconomic History   Marital status: Married    Spouse name: Not on file   Number of children: 0   Years of education: Not on file   Highest education level: Not on file  Occupational History   Occupation: disabled  Tobacco Use   Smoking status: Some Days    Packs/day: 0.25    Types: Cigarettes,  E-cigarettes    Start date: 10/13/1982    Last attempt to quit: 09/19/2011    Years since quitting: 10.0   Smokeless tobacco: Never   Tobacco comments:    electronic cig for 4 months   Vaping Use   Vaping Use: Never used  Substance and Sexual Activity   Alcohol use: Not Currently    Comment: weekends   Drug use: No   Sexual activity: Not on file  Other Topics Concern   Not on file  Social History Narrative   Patient is right handed.   Patient seldom drinks caffeine.   Social Determinants of Health   Financial Resource Strain: Not on file  Food Insecurity: No Food Insecurity (10/17/2021)   Hunger Vital Sign    Worried About Running Out of Food in the Last Year: Never true    Ran Out of Food in the Last Year: Never true  Transportation Needs: No Transportation Needs (10/17/2021)   PRAPARE - Hydrologist (Medical): No    Lack of Transportation (Non-Medical): No  Physical Activity: Not on file  Stress: Not on file  Social Connections: Not on file  Intimate Partner Violence: Not At Risk (10/17/2021)   Humiliation, Afraid, Rape, and Kick questionnaire    Fear of Current or Ex-Partner: No    Emotionally Abused: No    Physically Abused: No    Sexually Abused: No     Family History:     Family History  Problem Relation Age of Onset   Heart attack Father    Heart failure Father    Cancer Mother        Multiple myeloma   Heart disease Sister      ROS:  Please see the history of present illness.  Review of Systems  Constitutional: Negative.  HENT: Negative.    Cardiovascular:  Positive for dyspnea on exertion, leg swelling and orthopnea.  Respiratory: Negative.    Endocrine: Negative.   Hematologic/Lymphatic: Negative.   Musculoskeletal: Negative.   Gastrointestinal: Negative.   Genitourinary: Negative.   Neurological: Negative.     All other ROS reviewed and negative.     Physical Exam/Data:   Vitals:   10/18/21 1959 10/18/21 2336 10/19/21 0433 10/19/21 0500  BP: (!) 104/58 (!) 106/58 103/78   Pulse: (!) 49 (!) 51 61   Resp: _0 Temp: 98.3 F (36.8 C) 98.2 F (36.8 C) 98.6 F (37 C)   TempSrc:      SpO2: 96% 100% 96%   Weight:    90 kg  Height:    6' (1.829 m)    Intake/Output Summary (Last 24 hours) at 10/19/2021 3295 Last data filed at 10/18/2021 2337 Gross per 24 hour  Intake 600 ml  Output 1200 ml  Net -600 ml      10/19/2021    5:00 AM 10/18/2021    5:26 AM 10/17/2021    6:22 AM  Last 3 Weights  Weight (lbs) 198 lb 6.6 oz 198 lb 3.1 oz 207 lb  Weight (kg) 90 kg 89.9 kg 93.895 kg     Body mass index is 26.91 kg/m.  General:  Well nourished, well developed, in no acute distress  HEENT: normal Neck: no JVD Vascular: No carotid bruits; Distal pulses 2+ bilaterally Cardiac:  normal S1, S2; RRR; no murmur   Lungs:  clear to auscultation bilaterally, no wheezing, rhonchi or rales  Abd: soft, nontender, no hepatomegaly  Ext: no edema Musculoskeletal:  No deformities, BUE and BLE strength normal and equal Skin: warm and dry  Neuro:  CNs 2-12 intact, no focal abnormalities noted Psych:  Normal affect   EKG:  The EKG was personally reviewed and demonstrates:   NSR with IVCD Telemetry:  Telemetry was  personally reviewed and demonstrates:  NSR with PAC's  Relevant CV Studies:  Echo 08/2021 LVEF 30% grade III DD, mild MR and mild pulm HTN   Laboratory Data:  High Sensitivity Troponin:   Recent Labs  Lab 10/17/21 0647 10/17/21 0818  TROPONINIHS 57* 65*     Chemistry Recent Labs  Lab 10/17/21 0647 10/17/21 1218 10/18/21 0630 10/19/21 0552  NA 141  --  139 139  K 3.2*  --  3.7 3.2*  CL 108  --  105 103  CO2 25  --  26 27  GLUCOSE 145*  --  136* 163*  BUN 17  --  16 21  CREATININE 1.62*  --  1.61* 1.58*  CALCIUM 7.8*  --  8.0* 8.6*  MG  --  1.6* 2.5* 2.0  GFRNONAA 47*  --  47* 49*  ANIONGAP 8  --  8 9    No results for input(s): "PROT", "ALBUMIN", "AST", "ALT", "ALKPHOS", "BILITOT" in the last 168 hours. Lipids No results for input(s): "CHOL", "TRIG", "HDL", "LABVLDL", "LDLCALC", "CHOLHDL" in the last 168 hours.  Hematology Recent Labs  Lab 10/17/21 0647 10/19/21 0552  WBC 7.0 7.9  RBC 3.43* 3.59*  HGB 11.9* 12.5*  HCT 37.0* 37.8*  MCV 107.9* 105.3*  MCH 34.7* 34.8*  MCHC 32.2 33.1  RDW 14.1 13.5  PLT 237 237   Thyroid No results for input(s): "TSH", "FREET4" in the last 168 hours.  BNP Recent Labs  Lab 10/17/21 0647 10/18/21 0630 10/19/21 0552  BNP 2,970.0* 2,652.0* 2,237.0*    DDimer No results for input(s): "DDIMER" in the last 168 hours.   Radiology/Studies:  Portable chest 1 View  Result Date: 10/19/2021 CLINICAL DATA:  66 year old male with history of acute onset of heart failure and shortness of breath. EXAM: PORTABLE CHEST 1 VIEW COMPARISON:  Chest x-ray 10/17/2021. FINDINGS: Moderate right pleural effusion with opacity at the right base which may reflect atelectasis and/or consolidation. Left lung is clear. No left pleural effusion. No pneumothorax. No evidence of pulmonary edema. Heart size is normal. Upper mediastinal contours are within normal limits. IMPRESSION: 1. Atelectasis and/or consolidation in the right lung base with moderate right  pleural effusion. Followup PA and lateral chest X-ray is recommended in 3-4 weeks following trial of antibiotic therapy to ensure resolution and exclude underlying malignancy. Electronically Signed   By: Vinnie Langton M.D.   On: 10/19/2021 05:23   ECHOCARDIOGRAM COMPLETE  Result Date: 10/17/2021    ECHOCARDIOGRAM REPORT   Patient Name:   KADARIOUS DIKES [name] Date of Exam: 10/17/2021 Medical Rec #:  [id number]        Height:       72.0 in Accession #:    [id number]       Weight:       207.0 lb Date of Birth:  [birth year]       BSA:          2.162 m Patient Age:    48 years         BP:           125/79 mmHg Patient Gender: M                HR:  64 bpm. Exam Location:  [facility] Procedure: 2D Echo, Cardiac Doppler and Color Doppler Indications:    Congestive Heart Failure I50.9  History:        Patient has prior history of Echocardiogram examinations, most                 recent 11/25/2020. Cardiomyopathy, Arrythmias:Atrial                 Fibrillation; Risk Factors:Hypertension, Diabetes and                 Dyslipidemia. Acute HFrEF (heart failure with reduced ejection                 fraction), Fluid retention in legs (From Hx).  Sonographer:    Alvino Chapel RCS Referring Phys: [place]  1. Contrast demonstrates sluggish flow, but no thrombus. Left ventricular ejection fraction, by estimation, is <20%. Left ventricular ejection fraction by 2D MOD biplane is 19.3 %. The left ventricle has severely decreased function. The left ventricle demonstrates global hypokinesis. The left ventricular internal cavity size was mildly dilated. There is moderate left ventricular hypertrophy. Left ventricular diastolic parameters are consistent with Grade III diastolic dysfunction (restrictive). Elevated left ventricular end-diastolic pressure.  2. Right ventricular systolic function is moderately reduced. The right ventricular size is normal. There is moderately elevated pulmonary artery systolic  pressure. The estimated right ventricular systolic pressure is 42.3 mmHg.  3. Left atrial size was severely dilated.  4. The mitral valve is abnormal. Mild to moderate mitral valve regurgitation.  5. The aortic valve is tricuspid. Aortic valve regurgitation is not visualized.  6. The inferior vena cava is normal in size with <50% respiratory variability, suggesting right atrial pressure of 8 mmHg. Comparison(s): Changes from prior study are noted. 11/25/2020: LVEF 25-30%. FINDINGS  Left Ventricle: Contrast demonstrates sluggish flow, but no thrombus. Left ventricular ejection fraction, by estimation, is <20%. Left ventricular ejection fraction by 2D MOD biplane is 19.3 %. The left ventricle has severely decreased function. The left ventricle demonstrates global hypokinesis. Definity contrast agent was given IV to delineate the left ventricular endocardial borders. The left ventricular internal cavity size was mildly dilated. There is moderate left ventricular hypertrophy. Left  ventricular diastolic parameters are consistent with Grade III diastolic dysfunction (restrictive). Elevated left ventricular end-diastolic pressure. Right Ventricle: The right ventricular size is normal. No increase in right ventricular wall thickness. Right ventricular systolic function is moderately reduced. There is moderately elevated pulmonary artery systolic pressure. The tricuspid regurgitant velocity is 3.16 m/s, and with an assumed right atrial pressure of 8 mmHg, the estimated right ventricular systolic pressure is 53.6 mmHg. Left Atrium: Left atrial size was severely dilated. Right Atrium: Right atrial size was normal in size. Pericardium: There is no evidence of pericardial effusion. Mitral Valve: The mitral valve is abnormal. There is mild calcification of the anterior and posterior mitral valve leaflet(s). Mild to moderate mitral valve regurgitation. Tricuspid Valve: The tricuspid valve is grossly normal. Tricuspid valve  regurgitation is mild. Aortic Valve: The aortic valve is tricuspid. Aortic valve regurgitation is not visualized. Pulmonic Valve: The pulmonic valve was grossly normal. Pulmonic valve regurgitation is mild. Aorta: The aortic root and ascending aorta are structurally normal, with no evidence of dilitation. Venous: The inferior vena cava is normal in size with less than 50% respiratory variability, suggesting right atrial pressure of 8 mmHg. IAS/Shunts: No atrial level shunt detected by color flow Doppler.  LEFT VENTRICLE PLAX  2D                        Biplane EF (MOD) LVIDd:         5.80 cm         LV Biplane EF:   Left LVIDs:         5.50 cm                          ventricular LV PW:         1.30 cm                          ejection LV IVS:        1.40 cm                          fraction by LVOT diam:     2.00 cm                          2D MOD LV SV:         36                               biplane is LV SV Index:   17                               19.3 %. LVOT Area:     3.14 cm                                Diastology                                LV e' medial:    3.24 cm/s LV Volumes (MOD)               LV E/e' medial:  33.6 LV vol d, MOD    204.0 ml      LV e' lateral:   5.43 cm/s A2C:                           LV E/e' lateral: 20.1 LV vol d, MOD    169.0 ml A4C: LV vol s, MOD    165.0 ml A2C: LV vol s, MOD    141.0 ml A4C: LV SV MOD A2C:   39.0 ml LV SV MOD A4C:   169.0 ml LV SV MOD BP:    37.4 ml RIGHT VENTRICLE RV S prime:     7.62 cm/s TAPSE (M-mode): 2.3 cm LEFT ATRIUM              Index        RIGHT ATRIUM           Index LA diam:        4.60 cm  2.13 cm/m   RA Area:     17.00 cm LA Vol (A2C):   99.6 ml  46.07 ml/m  RA Volume:   51.30 ml  23.73 ml/m LA Vol (A4C):   127.0 ml 58.74 ml/m LA Biplane Vol: 123.0 ml 56.89 ml/m  AORTIC VALVE LVOT Vmax:   68.50 cm/s LVOT Vmean:  40.200 cm/s LVOT VTI:    0.116 m  AORTA Ao Root diam: 3.50 cm MITRAL VALVE                TRICUSPID VALVE MV Area (PHT): 4.06  cm     TR Peak grad:   39.9 mmHg MV Decel Time: 187 msec     TR Vmax:        316.00 cm/s MR Peak grad: 62.1 mmHg MR Mean grad: 40.0 mmHg     SHUNTS MR Vmax:      394.00 cm/s   Systemic VTI:  0.12 m MR Vmean:     293.0 cm/s    Systemic Diam: 2.00 cm MV E velocity: 109.00 cm/s MV A velocity: 25.70 cm/s MV E/A ratio:  4.24 Lyman Bishop MD Electronically signed by Lyman Bishop MD Signature Date/Time: 10/17/2021/12:50:48 PM    Final    DG Chest Portable 1 View  Result Date: 10/17/2021 CLINICAL DATA:  Shortness of breath with history of CHF. Chest pain. EXAM: PORTABLE CHEST 1 VIEW COMPARISON:  09/14/2021 FINDINGS: Stable cardiac enlargement. Lungs are hypoinflated. Moderate right pleural effusion with veil like opacification of the right lower lobe has increased from previous exam. Pulmonary vascular congestion identified. IMPRESSION: 1. Progressive right pleural effusion and pulmonary vascular congestion concerning for CHF. Electronically Signed   By: Kerby Moors M.D.   On: 10/17/2021 06:51     Assessment and Plan:   Acute on chronic systolic CHF-patient wasn't taking torsemide, entresto or spironolactone I/O's negative 762. Echo yest LVEF <20% was 30% 08/2021 K 3.2 and Crt 1.58. getting lasix 30 mg q12, K 40 meq daily. Entresto and aldactone on hold with renal function which is coming down. BP borderline.will try to restart low dose spironolactone and entresto. Replace K.  NICM Echo worse with LVEF <20%, no thrombus. Worsening most likely due to running out of meds. Will arrange early f/u in [place] office and help him apply for patient assistance.   PAF on eliquis and amiodarone  History of LV thrombus on eliquis.   CKD stage 3.     Risk Assessment/Risk Scores:        New York Heart Association (NYHA) Functional Class NYHA Class IV  CHA2DS2-VASc Score = 2   This indicates a 2.2% annual risk of stroke. The patient's score is based upon: CHF History: 1 HTN History: 0 Diabetes  History: 1 Stroke History: 0 Vascular Disease History: 0 Age Score: 0 Gender Score: 0         For questions or updates, please contact [place] Please consult [email or web address] for contact info under    Signed, Ermalinda Barrios, PA-C  10/19/2021 9:22 AM

## 2021-10-19 NOTE — TOC Progression Note (Signed)
Transition of Care [facility]) - Progression Note    Patient Details  Name: [name] [name] MRN: [id number] Date of Birth: [birth year]  [facility] of [facility] CM/SW Contact  Salome Arnt, [place] Phone Number: 10/19/2021, 1:41 PM  Clinical Narrative:  TOC received consult for medication assistance. Consult cleared as cardiology office is helping pt apply for assistance program.        Barriers to Discharge: Barriers Resolved  Expected Discharge Plan and Services           Expected Discharge Date: 10/19/21                                     Social Determinants of Health (SDOH) Interventions Housing Interventions: Intervention Not Indicated  Readmission Risk Interventions     No data to display

## 2021-10-19 NOTE — Discharge Instructions (Signed)
IMPORTANT INFORMATION: PAY CLOSE ATTENTION   PHYSICIAN DISCHARGE INSTRUCTIONS  Follow with Primary care provider  [name] Mile, NP  and other consultants as instructed by your Hospitalist Physician  [place] IF SYMPTOMS COME BACK, WORSEN OR NEW PROBLEM DEVELOPS   Please note: You were cared for by a hospitalist during your hospital stay. Every effort will be made to forward records to your primary care provider.  You can request that your primary care provider send for your hospital records if they have not received them.  Once you are discharged, your primary care physician will handle any further medical issues. Please note that NO REFILLS for any discharge medications will be authorized once you are discharged, as it is imperative that you return to your primary care physician (or establish a relationship with a primary care physician if you do not have one) for your post hospital discharge needs so that they can reassess your need for medications and monitor your lab values.  Please get a complete blood count and chemistry panel checked by your Primary MD at your next visit, and again as instructed by your Primary MD.  Get Medicines reviewed and adjusted: Please take all your medications with you for your next visit with your Primary MD  Laboratory/radiological data: Please request your Primary MD to go over all hospital tests and procedure/radiological results at the follow up, please ask your primary care provider to get all Hospital records sent to his/her office.  In some cases, they will be blood work, cultures and biopsy results pending at the time of your discharge. Please request that your primary care provider follow up on these results.  If you are diabetic, please bring your blood sugar readings with you to your follow up appointment with primary care.    Please call and make your follow up appointments as soon as possible.    Also Note  the following: If you experience worsening of your admission symptoms, develop shortness of breath, life threatening emergency, suicidal or homicidal thoughts you must seek medical attention immediately by calling 911 or calling your MD immediately  if symptoms less severe.  You must read complete instructions/literature along with all the possible adverse reactions/side effects for all the Medicines you take and that have been prescribed to you. Take any new Medicines after you have completely understood and accpet all the possible adverse reactions/side effects.   Do not drive when taking Pain medications or sleeping medications (Benzodiazepines)  Do not take more than prescribed Pain, Sleep and Anxiety Medications. It is not advisable to combine anxiety,sleep and pain medications without talking with your primary care practitioner  Special Instructions: If you have smoked or chewed Tobacco  in the last 2 yrs please stop smoking, stop any regular Alcohol  and or any Recreational drug use.  Wear Seat belts while driving.  Do not drive if taking any narcotic, mind altering or controlled substances or recreational drugs or alcohol.

## 2021-11-25 ENCOUNTER — Ambulatory Visit: Payer: Medicare HMO | Admitting: Student

## 2021-11-25 NOTE — Progress Notes (Deleted)
Cardiology Office Note    Date:  11/25/2021   ID:  [name] [name], DOB Jan 22, 1955, MRN [id number]  PCP:  [name], NP  Cardiologist: Carlyle Dolly, MD    No chief complaint on file.   History of Present Illness:    [name] [name] is a 66 y.o. male with past medical history of HFrEF/NICM (EF 15% in 2013 with cath showing normal cors, EF 30-35% by repeat echo in 2015 with no outpatient follow-up from 2016 to 04/2019, EF at 25-30% by echo in 04/2019 and repeat cath in 02/2020 showing normal cors), apical thrombus (diagnosed by echocardiogram in 04/2019), paroxysmal atrial fibrillation, HTN, HLD, Type 2 DM and Stage 3 CKD who presents to the office today for hospital follow-up.  He was admitted to [facility] in 10/2021 for an acute CHF exacerbation in the setting of medication noncompliance. Reported having been without his Torsemide, Entresto and Spironolactone for a minimum of several weeks. Repeat echocardiogram did show his EF had worsened as this was at less than 20% and there was sluggish flow at the apex but no evidence of thrombus. He was restarted on his cardiac medications and was discharged on Amiodarone 100 mg daily, Farxiga 10 mg daily, Eliquis 5 mg twice daily, Entresto 24-26 mg twice daily, Toprol-XL 50 mg twice daily, Simvastatin 20 mg daily, Spironolactone 12.5 mg daily and Torsemide 20 mg PRN.     Past Medical History:  Diagnosis Date   Anxiety    CHF (congestive heart failure) ([place])    a. EF 15% in 2013 with cath showing normal cors b. EF 30-35% by repeat echo in 5456   Chronic systolic heart failure ([place])    Diabetes mellitus    Fluid retention in legs    Hypertension     Past Surgical History:  Procedure Laterality Date   COLONOSCOPY  01/04/2012   Procedure: COLONOSCOPY;  Surgeon: Danie Binder, MD;  Location: [facility] ENDO SUITE;  Service: Endoscopy;  Laterality: N/A;  1:30 PM   HERNIA REPAIR     LEFT AND RIGHT HEART CATHETERIZATION WITH CORONARY  ANGIOGRAM N/A 07/09/2011   Procedure: LEFT AND RIGHT HEART CATHETERIZATION WITH CORONARY ANGIOGRAM;  Surgeon: Peter M Martinique, MD;  Location: [facility] CATH LAB;  Service: Cardiovascular;  Laterality: N/A;   RIGHT/LEFT HEART CATH AND CORONARY ANGIOGRAPHY N/A 02/25/2020   Procedure: RIGHT/LEFT HEART CATH AND CORONARY ANGIOGRAPHY;  Surgeon: Larey Dresser, MD;  Location: [facility] CV LAB;  Service: Cardiovascular;  Laterality: N/A;    Current Medications: Outpatient Medications Prior to Visit  Medication Sig Dispense Refill   amiodarone (PACERONE) 200 MG tablet Take 0.5 tablets (100 mg total) by mouth daily. 45 tablet 3   apixaban (ELIQUIS) 5 MG TABS tablet TAKE ONE TABLET BY MOUTH 2 TIMES A DAY 60 tablet 5   Blood Glucose Calibration (TRUE METRIX LEVEL 1) Low SOLN      Blood Glucose Monitoring Suppl (TRUE METRIX METER) w/Device KIT      dapagliflozin propanediol (FARXIGA) 10 MG TABS tablet Take 1 tablet (10 mg total) by mouth daily. 30 tablet 11   gabapentin (NEURONTIN) 100 MG capsule Take 200 mg by mouth 3 (three) times daily.     levothyroxine (SYNTHROID) 137 MCG tablet Take 137 mcg by mouth every morning.     Magnesium 250 MG TABS Take 250 mg by mouth daily.     metFORMIN (GLUCOPHAGE) 500 MG tablet Take 500 mg by mouth 2 (two) times daily.  metoprolol succinate (TOPROL-XL) 50 MG 24 hr tablet Take 1 tablet (50 mg total) by mouth 2 (two) times daily. 180 tablet 3   Oxycodone HCl 10 MG TABS Take 10 mg by mouth 4 (four) times daily as needed (for pain).      potassium chloride SA (KLOR-CON M) 20 MEQ tablet Take 1 tablet (20 mEq total) by mouth daily. 30 tablet 1   sacubitril-valsartan (ENTRESTO) 24-26 MG Take 1 tablet by mouth 2 (two) times daily. 60 tablet 1   simvastatin (ZOCOR) 20 MG tablet Take 1 tablet (20 mg total) by mouth at bedtime. 30 tablet 1   spironolactone (ALDACTONE) 25 MG tablet Take 0.5 tablets (12.5 mg total) by mouth daily. 30 tablet 1   torsemide (DEMADEX) 20 MG tablet Take 1  tablet (20 mg total) by mouth daily as needed (fluid). 30 tablet 1   TRUE METRIX BLOOD GLUCOSE TEST test strip      TRUEplus Lancets 33G MISC      No facility-administered medications prior to visit.     Allergies:   Patient has no known allergies.   Social History   Socioeconomic History   Marital status: Married    Spouse name: Not on file   Number of children: 0   Years of education: Not on file   Highest education level: Not on file  Occupational History   Occupation: disabled  Tobacco Use   Smoking status: Some Days    Packs/day: 0.25    Types: Cigarettes, E-cigarettes    Start date: 10/13/1982    Last attempt to quit: 09/19/2011    Years since quitting: 10.1   Smokeless tobacco: Never   Tobacco comments:    electronic cig for 4 months   Vaping Use   Vaping Use: Never used  Substance and Sexual Activity   Alcohol use: Not Currently    Comment: weekends   Drug use: No   Sexual activity: Not on file  Other Topics Concern   Not on file  Social History Narrative   Patient is right handed.   Patient seldom drinks caffeine.   Social Determinants of Health   Financial Resource Strain: Not on file  Food Insecurity: No Food Insecurity (10/17/2021)   Hunger Vital Sign    Worried About Running Out of Food in the Last Year: Never true    Ran Out of Food in the Last Year: Never true  Transportation Needs: No Transportation Needs (10/17/2021)   PRAPARE - Hydrologist (Medical): No    Lack of Transportation (Non-Medical): No  Physical Activity: Not on file  Stress: Not on file  Social Connections: Not on file     Family History:  The patient's ***family history includes Cancer in his mother; Heart attack in his father; Heart disease in his sister; Heart failure in his father.   Review of Systems:    Please see the history of present illness.     All other systems reviewed and are otherwise negative except as noted above.   Physical Exam:     VS:  There were no vitals taken for this visit.   General: Well developed, well nourished,male appearing in no acute distress. Head: Normocephalic, atraumatic. Neck: No carotid bruits. JVD not elevated.  Lungs: Respirations regular and unlabored, without wheezes or rales.  Heart: ***Regular rate and rhythm. No S3 or S4.  No murmur, no rubs, or gallops appreciated. Abdomen: Appears non-distended. No obvious abdominal masses. Msk:  Strength and  tone appear normal for age. No obvious joint deformities or effusions. Extremities: No clubbing or cyanosis. No edema.  Distal pedal pulses are 2+ bilaterally. Neuro: Alert and oriented X 3. Moves all extremities spontaneously. No focal deficits noted. Psych:  Responds to questions appropriately with a normal affect. Skin: No rashes or lesions noted  Wt Readings from Last 3 Encounters:  10/19/21 198 lb 6.6 oz (90 kg)  09/14/21 210 lb (95.3 kg)  01/07/21 213 lb 3 oz (96.7 kg)        Studies/Labs Reviewed:   EKG:  EKG is*** ordered today.  The ekg ordered today demonstrates ***  Recent Labs: 10/19/2021: B Natriuretic Peptide 2,237.0; BUN 21; Creatinine, Ser 1.58; Hemoglobin 12.5; Magnesium 2.0; Platelets 237; Potassium 3.2; Sodium 139   Lipid Panel    Component Value Date/Time   CHOL 121 07/31/2020 1222   TRIG 93 07/31/2020 1222   HDL 54 07/31/2020 1222   CHOLHDL 2.2 07/31/2020 1222   VLDL 19 07/31/2020 1222   LDLCALC 48 07/31/2020 1222    Additional studies/ records that were reviewed today include:   R/LHC: 02/2020 1. Normal/low filling pressures.  2. Low but not markedly low cardiac output.  3. No obstructive CAD, nonischemic cardiomyopathy.    Minimal contrast used.   Echocardiogram: 09/2021 IMPRESSIONS     1. Contrast demonstrates sluggish flow, but no thrombus. Left ventricular  ejection fraction, by estimation, is <20%. Left ventricular ejection  fraction by 2D MOD biplane is 19.3 %. The left ventricle has severely   decreased function. The left ventricle  demonstrates global hypokinesis. The left ventricular internal cavity size  was mildly dilated. There is moderate left ventricular hypertrophy. Left  ventricular diastolic parameters are consistent with Grade III diastolic  dysfunction (restrictive).  Elevated left ventricular end-diastolic pressure.   2. Right ventricular systolic function is moderately reduced. The right  ventricular size is normal. There is moderately elevated pulmonary artery  systolic pressure. The estimated right ventricular systolic pressure is  94.8 mmHg.   3. Left atrial size was severely dilated.   4. The mitral valve is abnormal. Mild to moderate mitral valve  regurgitation.   5. The aortic valve is tricuspid. Aortic valve regurgitation is not  visualized.   6. The inferior vena cava is normal in size with <50% respiratory  variability, suggesting right atrial pressure of 8 mmHg.   Comparison(s): Changes from prior study are noted. 11/25/2020: LVEF 25-30%.    Assessment:    No diagnosis found.   Plan:   In order of problems listed above:  ***    Shared Decision Making/Informed Consent:   {Are you ordering a CV Procedure (e.g. stress test, cath, DCCV, TEE, etc)?   Press F2        :[id number]}    Medication Adjustments/Labs and Tests Ordered: Current medicines are reviewed at length with the patient today.  Concerns regarding medicines are outlined above.  Medication changes, Labs and Tests ordered today are listed in the Patient Instructions below. There are no Patient Instructions on file for this visit.   Signed, Erma Heritage, PA-C  11/25/2021 7:40 AM    [facility] [address] [place], [place] [address] Phone: [phone] Fax: [phone]

## 2021-12-04 ENCOUNTER — Encounter: Payer: Self-pay | Admitting: *Deleted

## 2022-02-08 ENCOUNTER — Emergency Department (HOSPITAL_COMMUNITY): Payer: Medicare HMO

## 2022-02-08 ENCOUNTER — Encounter (HOSPITAL_COMMUNITY): Payer: Self-pay

## 2022-02-08 ENCOUNTER — Other Ambulatory Visit: Payer: Self-pay

## 2022-02-08 ENCOUNTER — Observation Stay (HOSPITAL_COMMUNITY): Payer: Medicare HMO | Admitting: Certified Registered Nurse Anesthetist

## 2022-02-08 ENCOUNTER — Inpatient Hospital Stay (HOSPITAL_COMMUNITY)
Admission: EM | Admit: 2022-02-08 | Discharge: 2022-02-10 | DRG: 253 | Disposition: A | Discharge status: Home / Self Care | Payer: Medicare HMO | Attending: Family Medicine | Admitting: Family Medicine

## 2022-02-08 ENCOUNTER — Observation Stay (HOSPITAL_BASED_OUTPATIENT_CLINIC_OR_DEPARTMENT_OTHER): Payer: Medicare HMO | Admitting: Certified Registered Nurse Anesthetist

## 2022-02-08 ENCOUNTER — Encounter (HOSPITAL_COMMUNITY)
Admission: EM | Disposition: A | Discharge status: Home / Self Care | Payer: Self-pay | Source: Home / Self Care | Attending: Family Medicine

## 2022-02-08 DIAGNOSIS — Z79899 Other long term (current) drug therapy: Secondary | ICD-10-CM | POA: Diagnosis not present

## 2022-02-08 DIAGNOSIS — I4891 Unspecified atrial fibrillation: Secondary | ICD-10-CM | POA: Diagnosis not present

## 2022-02-08 DIAGNOSIS — T45516A Underdosing of anticoagulants, initial encounter: Secondary | ICD-10-CM | POA: Diagnosis not present

## 2022-02-08 DIAGNOSIS — I743 Embolism and thrombosis of arteries of the lower extremities: Secondary | ICD-10-CM | POA: Diagnosis present

## 2022-02-08 DIAGNOSIS — Z8249 Family history of ischemic heart disease and other diseases of the circulatory system: Secondary | ICD-10-CM | POA: Diagnosis not present

## 2022-02-08 DIAGNOSIS — I110 Hypertensive heart disease with heart failure: Secondary | ICD-10-CM | POA: Diagnosis not present

## 2022-02-08 DIAGNOSIS — I255 Ischemic cardiomyopathy: Secondary | ICD-10-CM | POA: Diagnosis present

## 2022-02-08 DIAGNOSIS — E538 Deficiency of other specified B group vitamins: Secondary | ICD-10-CM | POA: Diagnosis present

## 2022-02-08 DIAGNOSIS — F17210 Nicotine dependence, cigarettes, uncomplicated: Secondary | ICD-10-CM | POA: Diagnosis present

## 2022-02-08 DIAGNOSIS — E039 Hypothyroidism, unspecified: Secondary | ICD-10-CM | POA: Diagnosis present

## 2022-02-08 DIAGNOSIS — Z7984 Long term (current) use of oral hypoglycemic drugs: Secondary | ICD-10-CM | POA: Diagnosis not present

## 2022-02-08 DIAGNOSIS — R079 Chest pain, unspecified: Secondary | ICD-10-CM

## 2022-02-08 DIAGNOSIS — I509 Heart failure, unspecified: Secondary | ICD-10-CM

## 2022-02-08 DIAGNOSIS — I998 Other disorder of circulatory system: Secondary | ICD-10-CM | POA: Diagnosis present

## 2022-02-08 DIAGNOSIS — D539 Nutritional anemia, unspecified: Secondary | ICD-10-CM | POA: Diagnosis not present

## 2022-02-08 DIAGNOSIS — N1831 Chronic kidney disease, stage 3a: Secondary | ICD-10-CM | POA: Diagnosis present

## 2022-02-08 DIAGNOSIS — Z79890 Hormone replacement therapy (postmenopausal): Secondary | ICD-10-CM

## 2022-02-08 DIAGNOSIS — Z91128 Patient's intentional underdosing of medication regimen for other reason: Secondary | ICD-10-CM | POA: Diagnosis not present

## 2022-02-08 DIAGNOSIS — I771 Stricture of artery: Secondary | ICD-10-CM | POA: Diagnosis not present

## 2022-02-08 DIAGNOSIS — E1122 Type 2 diabetes mellitus with diabetic chronic kidney disease: Secondary | ICD-10-CM | POA: Diagnosis present

## 2022-02-08 DIAGNOSIS — N183 Chronic kidney disease, stage 3 unspecified: Secondary | ICD-10-CM | POA: Diagnosis not present

## 2022-02-08 DIAGNOSIS — E1121 Type 2 diabetes mellitus with diabetic nephropathy: Secondary | ICD-10-CM | POA: Diagnosis present

## 2022-02-08 DIAGNOSIS — Z807 Family history of other malignant neoplasms of lymphoid, hematopoietic and related tissues: Secondary | ICD-10-CM

## 2022-02-08 DIAGNOSIS — I5022 Chronic systolic (congestive) heart failure: Secondary | ICD-10-CM | POA: Diagnosis not present

## 2022-02-08 DIAGNOSIS — E1169 Type 2 diabetes mellitus with other specified complication: Secondary | ICD-10-CM | POA: Diagnosis present

## 2022-02-08 DIAGNOSIS — I428 Other cardiomyopathies: Secondary | ICD-10-CM | POA: Diagnosis present

## 2022-02-08 DIAGNOSIS — I70221 Atherosclerosis of native arteries of extremities with rest pain, right leg: Secondary | ICD-10-CM | POA: Diagnosis not present

## 2022-02-08 DIAGNOSIS — I480 Paroxysmal atrial fibrillation: Secondary | ICD-10-CM | POA: Diagnosis not present

## 2022-02-08 DIAGNOSIS — R7989 Other specified abnormal findings of blood chemistry: Secondary | ICD-10-CM | POA: Diagnosis not present

## 2022-02-08 DIAGNOSIS — E1151 Type 2 diabetes mellitus with diabetic peripheral angiopathy without gangrene: Secondary | ICD-10-CM | POA: Diagnosis not present

## 2022-02-08 DIAGNOSIS — I513 Intracardiac thrombosis, not elsewhere classified: Secondary | ICD-10-CM | POA: Insufficient documentation

## 2022-02-08 DIAGNOSIS — I130 Hypertensive heart and chronic kidney disease with heart failure and stage 1 through stage 4 chronic kidney disease, or unspecified chronic kidney disease: Secondary | ICD-10-CM | POA: Diagnosis not present

## 2022-02-08 DIAGNOSIS — Z7901 Long term (current) use of anticoagulants: Secondary | ICD-10-CM | POA: Diagnosis not present

## 2022-02-08 DIAGNOSIS — Z91148 Patient's other noncompliance with medication regimen for other reason: Secondary | ICD-10-CM | POA: Diagnosis not present

## 2022-02-08 DIAGNOSIS — R1110 Vomiting, unspecified: Secondary | ICD-10-CM | POA: Diagnosis not present

## 2022-02-08 DIAGNOSIS — N179 Acute kidney failure, unspecified: Secondary | ICD-10-CM | POA: Diagnosis present

## 2022-02-08 HISTORY — PX: THROMBECTOMY FEMORAL ARTERY: SHX6406

## 2022-02-08 HISTORY — PX: ENDARTERECTOMY POPLITEAL: SHX5806

## 2022-02-08 HISTORY — PX: VEIN HARVEST: SHX6363

## 2022-02-08 LAB — COMPREHENSIVE METABOLIC PANEL
ALT: 12 U/L (ref 0–44)
AST: 31 U/L (ref 15–41)
Albumin: 3.5 g/dL (ref 3.5–5.0)
Alkaline Phosphatase: 91 U/L (ref 38–126)
Anion gap: 11 (ref 5–15)
BUN: 22 mg/dL (ref 8–23)
CO2: 21 mmol/L — ABNORMAL LOW (ref 22–32)
Calcium: 8.9 mg/dL (ref 8.9–10.3)
Chloride: 104 mmol/L (ref 98–111)
Creatinine, Ser: 1.98 mg/dL — ABNORMAL HIGH (ref 0.61–1.24)
GFR, Estimated: 37 mL/min — ABNORMAL LOW (ref >60)
Glucose, Bld: 111 mg/dL — ABNORMAL HIGH (ref 70–99)
Potassium: 4.1 mmol/L (ref 3.5–5.1)
Sodium: 136 mmol/L (ref 135–145)
Total Bilirubin: 0.7 mg/dL (ref 0.3–1.2)
Total Protein: 7.8 g/dL (ref 6.5–8.1)

## 2022-02-08 LAB — CBC WITH DIFFERENTIAL/PLATELET
Abs Immature Granulocytes: 0.03 10*3/uL (ref 0.00–0.07)
Basophils Absolute: 0 10*3/uL (ref 0.0–0.1)
Basophils Relative: 0 %
Eosinophils Absolute: 0.1 10*3/uL (ref 0.0–0.5)
Eosinophils Relative: 1 %
HCT: 40.2 % (ref 39.0–52.0)
Hemoglobin: 13 g/dL (ref 13.0–17.0)
Immature Granulocytes: 0 %
Lymphocytes Relative: 20 %
Lymphs Abs: 1.4 10*3/uL (ref 0.7–4.0)
MCH: 32.7 pg (ref 26.0–34.0)
MCHC: 32.3 g/dL (ref 30.0–36.0)
MCV: 101.3 fL — ABNORMAL HIGH (ref 80.0–100.0)
Monocytes Absolute: 0.5 10*3/uL (ref 0.1–1.0)
Monocytes Relative: 7 %
Neutro Abs: 4.8 10*3/uL (ref 1.7–7.7)
Neutrophils Relative %: 72 %
Platelets: 299 10*3/uL (ref 150–400)
RBC: 3.97 MIL/uL — ABNORMAL LOW (ref 4.22–5.81)
RDW: 13.4 % (ref 11.5–15.5)
WBC: 6.7 10*3/uL (ref 4.0–10.5)
nRBC: 0 % (ref 0.0–0.2)

## 2022-02-08 LAB — APTT: aPTT: 33 seconds (ref 24–36)

## 2022-02-08 LAB — ECHOCARDIOGRAM COMPLETE
Area-P 1/2: 5.75 cm2
Est EF: <20
Height: 72 in
MV M vel: 3.68 m/s
MV Peak grad: 54.2 mmHg
S' Lateral: 5.2 cm
Weight: 2944 oz

## 2022-02-08 LAB — BRAIN NATRIURETIC PEPTIDE: B Natriuretic Peptide: 1635 pg/mL — ABNORMAL HIGH (ref 0.0–100.0)

## 2022-02-08 LAB — SURGICAL PCR SCREEN
MRSA, PCR: NEGATIVE
Staphylococcus aureus: NEGATIVE

## 2022-02-08 LAB — TYPE AND SCREEN
ABO/RH(D): O POS
Antibody Screen: NEGATIVE

## 2022-02-08 LAB — HEPARIN LEVEL (UNFRACTIONATED)
Heparin Unfractionated: <0.1 IU/mL — ABNORMAL LOW (ref 0.30–0.70)
Heparin Unfractionated: <0.1 IU/mL — ABNORMAL LOW (ref 0.30–0.70)

## 2022-02-08 LAB — ABO/RH: ABO/RH(D): O POS

## 2022-02-08 LAB — TROPONIN I (HIGH SENSITIVITY)
Troponin I (High Sensitivity): 5074 ng/L (ref <18)
Troponin I (High Sensitivity): 4501 ng/L (ref <18)

## 2022-02-08 LAB — GLUCOSE, CAPILLARY
Glucose-Capillary: 86 mg/dL (ref 70–99)
Glucose-Capillary: 151 mg/dL — ABNORMAL HIGH (ref 70–99)

## 2022-02-08 SURGERY — THROMBECTOMY, ARTERY, FEMORAL
Anesthesia: General | Site: Leg Lower | Laterality: Right

## 2022-02-08 MED ORDER — SUGAMMADEX SODIUM 200 MG/2ML IV SOLN
INTRAVENOUS | Status: DC | PRN
  Administered 2022-02-08 (×2): 100 mg via INTRAVENOUS

## 2022-02-08 MED ORDER — PERFLUTREN LIPID MICROSPHERE
1.0000 mL | INTRAVENOUS | Status: AC | PRN
  Administered 2022-02-08: 8 mL via INTRAVENOUS

## 2022-02-08 MED ORDER — SENNOSIDES-DOCUSATE SODIUM 8.6-50 MG PO TABS: 1.0000 | ORAL_TABLET | Freq: Every evening | ORAL | Status: DC | PRN

## 2022-02-08 MED ORDER — PHENOL 1.4 % MT LIQD: 1.0000 | OROMUCOSAL | Status: DC | PRN

## 2022-02-08 MED ORDER — OXYCODONE HCL 5 MG PO TABS
5.0000 mg | ORAL_TABLET | ORAL | Status: DC | PRN
  Administered 2022-02-08 – 2022-02-09 (×2): 10 mg via ORAL
  Administered 2022-02-09 (×2): 5 mg via ORAL
  Administered 2022-02-09 – 2022-02-10 (×4): 10 mg via ORAL
  Filled 2022-02-08 (×7): qty 2
  Filled 2022-02-08: qty 1

## 2022-02-08 MED ORDER — ROCURONIUM BROMIDE 10 MG/ML (PF) SYRINGE
PREFILLED_SYRINGE | INTRAVENOUS | Status: DC | PRN
  Administered 2022-02-08: 60 mg via INTRAVENOUS
  Administered 2022-02-08: 40 mg via INTRAVENOUS

## 2022-02-08 MED ORDER — FENTANYL CITRATE (PF) 100 MCG/2ML IJ SOLN
25.0000 ug | INTRAMUSCULAR | Status: DC | PRN
  Administered 2022-02-08: 50 ug via INTRAVENOUS

## 2022-02-08 MED ORDER — EPHEDRINE SULFATE-NACL 50-0.9 MG/10ML-% IV SOSY: PREFILLED_SYRINGE | INTRAVENOUS | Status: DC | PRN

## 2022-02-08 MED ORDER — SODIUM CHLORIDE 0.9 % IV SOLN: INTRAVENOUS | Status: DC

## 2022-02-08 MED ORDER — ONDANSETRON HCL 4 MG/2ML IJ SOLN: 4.0000 mg | Freq: Once | INTRAMUSCULAR | Status: DC | PRN

## 2022-02-08 MED ORDER — ACETAMINOPHEN 325 MG PO TABS
325.0000 mg | ORAL_TABLET | ORAL | Status: DC | PRN
  Administered 2022-02-08 – 2022-02-09 (×2): 650 mg via ORAL
  Filled 2022-02-08 (×2): qty 2

## 2022-02-08 MED ORDER — DEXMEDETOMIDINE HCL IN NACL 80 MCG/20ML IV SOLN
INTRAVENOUS | Status: DC | PRN
  Administered 2022-02-08: 8 ug via BUCCAL

## 2022-02-08 MED ORDER — PROPOFOL 10 MG/ML IV BOLUS
INTRAVENOUS | Status: AC
  Filled 2022-02-08: qty 20

## 2022-02-08 MED ORDER — HYDROMORPHONE HCL 1 MG/ML IJ SOLN
0.5000 mg | Freq: Once | INTRAMUSCULAR | Status: AC
  Administered 2022-02-08: 0.5 mg via INTRAVENOUS
  Filled 2022-02-08: qty 0.5

## 2022-02-08 MED ORDER — CHLORHEXIDINE GLUCONATE 0.12 % MT SOLN
15.0000 mL | Freq: Once | OROMUCOSAL | Status: AC
  Administered 2022-02-08: 15 mL via OROMUCOSAL

## 2022-02-08 MED ORDER — HYDROMORPHONE HCL 1 MG/ML IJ SOLN
0.5000 mg | INTRAMUSCULAR | Status: DC | PRN
  Administered 2022-02-09: 1 mg via INTRAVENOUS
  Filled 2022-02-08 (×2): qty 1

## 2022-02-08 MED ORDER — AMIODARONE HCL 200 MG PO TABS
100.0000 mg | ORAL_TABLET | Freq: Every day | ORAL | Status: DC
  Administered 2022-02-08 – 2022-02-10 (×3): 100 mg via ORAL
  Filled 2022-02-08 (×3): qty 1

## 2022-02-08 MED ORDER — GUAIFENESIN-DM 100-10 MG/5ML PO SYRP: 15.0000 mL | ORAL_SOLUTION | ORAL | Status: DC | PRN

## 2022-02-08 MED ORDER — HEPARIN 6000 UNIT IRRIGATION SOLUTION
Status: DC | PRN
  Administered 2022-02-08: 1

## 2022-02-08 MED ORDER — PHENYLEPHRINE 80 MCG/ML (10ML) SYRINGE FOR IV PUSH (FOR BLOOD PRESSURE SUPPORT)
PREFILLED_SYRINGE | INTRAVENOUS | Status: DC | PRN
  Administered 2022-02-08 (×4): 80 ug via INTRAVENOUS
  Administered 2022-02-08: 120 ug via INTRAVENOUS

## 2022-02-08 MED ORDER — HEMOSTATIC AGENTS (NO CHARGE) OPTIME
TOPICAL | Status: DC | PRN
  Administered 2022-02-08: 1 via TOPICAL

## 2022-02-08 MED ORDER — HEPARIN (PORCINE) 25000 UT/250ML-% IV SOLN
1400.0000 [IU]/h | INTRAVENOUS | Status: DC
  Administered 2022-02-08: 1000 [IU]/h via INTRAVENOUS
  Filled 2022-02-08 (×2): qty 250

## 2022-02-08 MED ORDER — SODIUM CHLORIDE 0.9 % IV SOLN
500.0000 mL | Freq: Once | INTRAVENOUS | Status: AC | PRN
  Administered 2022-02-10: 500 mL via INTRAVENOUS

## 2022-02-08 MED ORDER — PANTOPRAZOLE SODIUM 40 MG PO TBEC
40.0000 mg | DELAYED_RELEASE_TABLET | Freq: Every day | ORAL | Status: DC
  Administered 2022-02-08 – 2022-02-10 (×3): 40 mg via ORAL
  Filled 2022-02-08 (×3): qty 1

## 2022-02-08 MED ORDER — MIDAZOLAM HCL 2 MG/2ML IJ SOLN
INTRAMUSCULAR | Status: AC
  Filled 2022-02-08: qty 2

## 2022-02-08 MED ORDER — HEPARIN SODIUM (PORCINE) 1000 UNIT/ML IJ SOLN
INTRAMUSCULAR | Status: AC
  Filled 2022-02-08: qty 10

## 2022-02-08 MED ORDER — SODIUM CHLORIDE 0.9 % IV SOLN: INTRAVENOUS | Status: DC | PRN

## 2022-02-08 MED ORDER — DOCUSATE SODIUM 100 MG PO CAPS
100.0000 mg | ORAL_CAPSULE | Freq: Every day | ORAL | Status: DC
  Administered 2022-02-09 – 2022-02-10 (×2): 100 mg via ORAL
  Filled 2022-02-08 (×2): qty 1

## 2022-02-08 MED ORDER — LACTATED RINGERS IV SOLN: INTRAVENOUS | Status: DC

## 2022-02-08 MED ORDER — MAGNESIUM SULFATE 2 GM/50ML IV SOLN: 2.0000 g | Freq: Every day | INTRAVENOUS | Status: DC | PRN

## 2022-02-08 MED ORDER — HEPARIN 6000 UNIT IRRIGATION SOLUTION
Status: AC
  Filled 2022-02-08: qty 500

## 2022-02-08 MED ORDER — ACETAMINOPHEN 325 MG RE SUPP: 325.0000 mg | RECTAL | Status: DC | PRN

## 2022-02-08 MED ORDER — 0.9 % SODIUM CHLORIDE (POUR BTL) OPTIME
TOPICAL | Status: DC | PRN
  Administered 2022-02-08: 2000 mL

## 2022-02-08 MED ORDER — LIDOCAINE 2% (20 MG/ML) 5 ML SYRINGE
INTRAMUSCULAR | Status: DC | PRN
  Administered 2022-02-08: 80 mg via INTRAVENOUS
  Administered 2022-02-08: 20 mg via INTRAVENOUS

## 2022-02-08 MED ORDER — METOPROLOL SUCCINATE ER 25 MG PO TB24
100.0000 mg | ORAL_TABLET | Freq: Every day | ORAL | Status: DC
  Administered 2022-02-08: 100 mg via ORAL
  Filled 2022-02-08 (×3): qty 4

## 2022-02-08 MED ORDER — PHENYLEPHRINE HCL-NACL 20-0.9 MG/250ML-% IV SOLN
INTRAVENOUS | Status: DC | PRN
  Administered 2022-02-08: 25 ug/min via INTRAVENOUS

## 2022-02-08 MED ORDER — FENTANYL CITRATE (PF) 250 MCG/5ML IJ SOLN
INTRAMUSCULAR | Status: AC
  Filled 2022-02-08: qty 5

## 2022-02-08 MED ORDER — HEPARIN BOLUS VIA INFUSION
4000.0000 [IU] | Freq: Once | INTRAVENOUS | Status: AC
  Administered 2022-02-08: 4000 [IU] via INTRAVENOUS

## 2022-02-08 MED ORDER — IOHEXOL 350 MG/ML SOLN
75.0000 mL | Freq: Once | INTRAVENOUS | Status: AC | PRN
  Administered 2022-02-08: 75 mL via INTRAVENOUS

## 2022-02-08 MED ORDER — ALUM & MAG HYDROXIDE-SIMETH 200-200-20 MG/5ML PO SUSP: 15.0000 mL | ORAL | Status: DC | PRN

## 2022-02-08 MED ORDER — FENTANYL CITRATE (PF) 250 MCG/5ML IJ SOLN
INTRAMUSCULAR | Status: DC | PRN
  Administered 2022-02-08 (×3): 50 ug via INTRAVENOUS

## 2022-02-08 MED ORDER — POTASSIUM CHLORIDE CRYS ER 20 MEQ PO TBCR: 20.0000 meq | EXTENDED_RELEASE_TABLET | Freq: Every day | ORAL | Status: DC | PRN

## 2022-02-08 MED ORDER — ONDANSETRON HCL 4 MG/2ML IJ SOLN
4.0000 mg | Freq: Four times a day (QID) | INTRAMUSCULAR | Status: DC | PRN
  Administered 2022-02-09 (×2): 4 mg via INTRAVENOUS
  Filled 2022-02-08 (×2): qty 2

## 2022-02-08 MED ORDER — CEFAZOLIN SODIUM-DEXTROSE 2-3 GM-%(50ML) IV SOLR
INTRAVENOUS | Status: DC | PRN
  Administered 2022-02-08: 2 g via INTRAVENOUS

## 2022-02-08 MED ORDER — SODIUM CHLORIDE 0.9% FLUSH
3.0000 mL | Freq: Two times a day (BID) | INTRAVENOUS | Status: DC
  Administered 2022-02-08 – 2022-02-09 (×2): 3 mL via INTRAVENOUS

## 2022-02-08 MED ORDER — HYDRALAZINE HCL 20 MG/ML IJ SOLN: 5.0000 mg | INTRAMUSCULAR | Status: DC | PRN

## 2022-02-08 MED ORDER — LEVOTHYROXINE SODIUM 25 MCG PO TABS
137.0000 ug | ORAL_TABLET | Freq: Every morning | ORAL | Status: DC
  Administered 2022-02-09: 112 ug via ORAL
  Administered 2022-02-10: 137 ug via ORAL
  Filled 2022-02-08 (×2): qty 1

## 2022-02-08 MED ORDER — LABETALOL HCL 5 MG/ML IV SOLN: 10.0000 mg | INTRAVENOUS | Status: DC | PRN

## 2022-02-08 MED ORDER — ORAL CARE MOUTH RINSE: 15.0000 mL | Freq: Once | OROMUCOSAL | Status: AC

## 2022-02-08 MED ORDER — ONDANSETRON HCL 4 MG/2ML IJ SOLN
INTRAMUSCULAR | Status: DC | PRN
  Administered 2022-02-08: 4 mg via INTRAVENOUS

## 2022-02-08 MED ORDER — ASPIRIN 81 MG PO TBEC
81.0000 mg | DELAYED_RELEASE_TABLET | Freq: Every day | ORAL | Status: DC
  Administered 2022-02-09 – 2022-02-10 (×2): 81 mg via ORAL
  Filled 2022-02-08 (×2): qty 1

## 2022-02-08 MED ORDER — CEFAZOLIN SODIUM-DEXTROSE 2-4 GM/100ML-% IV SOLN
2.0000 g | Freq: Three times a day (TID) | INTRAVENOUS | Status: AC
  Administered 2022-02-09 (×2): 2 g via INTRAVENOUS
  Filled 2022-02-08 (×2): qty 100

## 2022-02-08 MED ORDER — ETOMIDATE 2 MG/ML IV SOLN
INTRAVENOUS | Status: DC | PRN
  Administered 2022-02-08: 14 mg via INTRAVENOUS

## 2022-02-08 MED ORDER — DEXMEDETOMIDINE HCL IN NACL 80 MCG/20ML IV SOLN
INTRAVENOUS | Status: AC
  Filled 2022-02-08: qty 20

## 2022-02-08 MED ORDER — HYDROMORPHONE HCL 1 MG/ML IJ SOLN
0.5000 mg | Freq: Once | INTRAMUSCULAR | Status: AC
  Administered 2022-02-08: 0.5 mg via INTRAVENOUS
  Filled 2022-02-08: qty 1

## 2022-02-08 MED ORDER — HYDROMORPHONE HCL 1 MG/ML IJ SOLN
0.5000 mg | INTRAMUSCULAR | Status: DC | PRN
  Filled 2022-02-08: qty 1

## 2022-02-08 MED ORDER — HEPARIN SODIUM (PORCINE) 1000 UNIT/ML IJ SOLN
INTRAMUSCULAR | Status: DC | PRN
  Administered 2022-02-08: 5000 [IU] via INTRAVENOUS

## 2022-02-08 MED ORDER — ETOMIDATE 2 MG/ML IV SOLN
INTRAVENOUS | Status: AC
  Filled 2022-02-08: qty 10

## 2022-02-08 MED ORDER — METOPROLOL TARTRATE 5 MG/5ML IV SOLN: 2.0000 mg | INTRAVENOUS | Status: DC | PRN

## 2022-02-08 MED ORDER — ACETAMINOPHEN 10 MG/ML IV SOLN: 1000.0000 mg | Freq: Once | INTRAVENOUS | Status: DC | PRN

## 2022-02-08 SURGICAL SUPPLY — 71 items
ADH SKN CLS APL DERMABOND .7 (GAUZE/BANDAGES/DRESSINGS) ×1
BAG COUNTER SPONGE SURGICOUNT (BAG) ×2 IMPLANT
BAG ISL DRAPE 18X18 STRL (DRAPES) ×1
BAG ISOLATION DRAPE 18X18 (DRAPES) IMPLANT
BAG SPNG CNTER NS LX DISP (BAG) ×1
BANDAGE ESMARK 6X9 LF (GAUZE/BANDAGES/DRESSINGS) IMPLANT
BNDG CMPR 9X6 STRL LF SNTH (GAUZE/BANDAGES/DRESSINGS)
BNDG ELASTIC 4X5.8 VLCR STR LF (GAUZE/BANDAGES/DRESSINGS) IMPLANT
BNDG ESMARK 6X9 LF (GAUZE/BANDAGES/DRESSINGS)
CANISTER SUCT 3000ML PPV (MISCELLANEOUS) ×2 IMPLANT
CANNULA VESSEL 3MM 2 BLNT TIP (CANNULA) IMPLANT
CATH EMB 3FR 40CM (CATHETERS) IMPLANT
CATH EMB 3FR 80CM (CATHETERS) IMPLANT
CATH EMB 4FR 80CM (CATHETERS) IMPLANT
CATH EMB 5FR 80CM (CATHETERS) IMPLANT
CLIP LIGATING EXTRA MED SLVR (CLIP) ×2 IMPLANT
CLIP LIGATING EXTRA SM BLUE (MISCELLANEOUS) ×2 IMPLANT
CNTNR URN SCR LID CUP LEK RST (MISCELLANEOUS) IMPLANT
CONT SPEC 4OZ STRL OR WHT (MISCELLANEOUS) ×1
CUFF TOURN SGL QUICK 24 (TOURNIQUET CUFF)
CUFF TOURN SGL QUICK 34 (TOURNIQUET CUFF)
CUFF TOURN SGL QUICK 42 (TOURNIQUET CUFF) IMPLANT
CUFF TRNQT CYL 24X4X16.5-23 (TOURNIQUET CUFF) IMPLANT
CUFF TRNQT CYL 34X4.125X (TOURNIQUET CUFF) IMPLANT
DERMABOND ADVANCED .7 DNX12 (GAUZE/BANDAGES/DRESSINGS) ×2 IMPLANT
DRAIN CHANNEL 15F RND FF W/TCR (WOUND CARE) IMPLANT
DRAPE HALF SHEET 40X57 (DRAPES) IMPLANT
DRAPE ISOLATION BAG 18X18 (DRAPES) ×1
DRAPE X-RAY CASS 24X20 (DRAPES) IMPLANT
DRSG COVADERM 4X8 (GAUZE/BANDAGES/DRESSINGS) IMPLANT
ELECT REM PT RETURN 9FT ADLT (ELECTROSURGICAL) ×1
ELECTRODE REM PT RTRN 9FT ADLT (ELECTROSURGICAL) ×2 IMPLANT
EVACUATOR SILICONE 100CC (DRAIN) IMPLANT
GLOVE BIO SURGEON STRL SZ7.5 (GLOVE) ×2 IMPLANT
GOWN STRL REUS W/ TWL LRG LVL3 (GOWN DISPOSABLE) ×4 IMPLANT
GOWN STRL REUS W/ TWL XL LVL3 (GOWN DISPOSABLE) ×2 IMPLANT
GOWN STRL REUS W/TWL LRG LVL3 (GOWN DISPOSABLE) ×2
GOWN STRL REUS W/TWL XL LVL3 (GOWN DISPOSABLE) ×1
INSERT FOGARTY SM (MISCELLANEOUS) IMPLANT
KIT BASIN OR (CUSTOM PROCEDURE TRAY) ×2 IMPLANT
KIT TURNOVER KIT B (KITS) ×2 IMPLANT
MARKER GRAFT CORONARY BYPASS (MISCELLANEOUS) IMPLANT
NS IRRIG 1000ML POUR BTL (IV SOLUTION) ×4 IMPLANT
PACK PERIPHERAL VASCULAR (CUSTOM PROCEDURE TRAY) ×2 IMPLANT
PAD ARMBOARD 7.5X6 YLW CONV (MISCELLANEOUS) ×4 IMPLANT
POWDER SURGICEL 3.0 GRAM (HEMOSTASIS) IMPLANT
SET COLLECT BLD 21X3/4 12 (NEEDLE) IMPLANT
STAPLER VISISTAT 35W (STAPLE) IMPLANT
STOPCOCK 4 WAY LG BORE MALE ST (IV SETS) IMPLANT
SUT ETHILON 3 0 PS 1 (SUTURE) IMPLANT
SUT GORETEX 6.0 TT13 (SUTURE) IMPLANT
SUT GORETEX 6.0 TT9 (SUTURE) IMPLANT
SUT MNCRL AB 4-0 PS2 18 (SUTURE) ×4 IMPLANT
SUT PROLENE 5 0 C 1 24 (SUTURE) ×2 IMPLANT
SUT PROLENE 6 0 BV (SUTURE) ×2 IMPLANT
SUT PROLENE 7 0 BV 1 (SUTURE) IMPLANT
SUT SILK 2 0 SH (SUTURE) ×2 IMPLANT
SUT SILK 3 0 (SUTURE) ×1
SUT SILK 3-0 18XBRD TIE 12 (SUTURE) IMPLANT
SUT VIC AB 2-0 CT1 27 (SUTURE) ×1
SUT VIC AB 2-0 CT1 TAPERPNT 27 (SUTURE) ×4 IMPLANT
SUT VIC AB 3-0 SH 27 (SUTURE) ×1
SUT VIC AB 3-0 SH 27X BRD (SUTURE) ×4 IMPLANT
SYR 3ML LL SCALE MARK (SYRINGE) IMPLANT
TAPE STRIPS DRAPE STRL (GAUZE/BANDAGES/DRESSINGS) IMPLANT
TAPE UMBILICAL COTTON 1/8X30 (MISCELLANEOUS) IMPLANT
TOWEL GREEN STERILE (TOWEL DISPOSABLE) ×2 IMPLANT
TRAY FOLEY MTR SLVR 16FR STAT (SET/KITS/TRAYS/PACK) IMPLANT
TUBING EXTENTION W/L.L. (IV SETS) IMPLANT
UNDERPAD 30X36 HEAVY ABSORB (UNDERPADS AND DIAPERS) ×2 IMPLANT
WATER STERILE IRR 1000ML POUR (IV SOLUTION) ×2 IMPLANT

## 2022-02-08 NOTE — Progress Notes (Signed)
ANTICOAGULATION CONSULT NOTE  Pharmacy Consult for Heparin Indication: afib, RLE thrombus  Allergies  Allergen Reactions   Bee Pollen Swelling    Patient Measurements: Height: 6' (182.9 cm) Weight: 83.5 kg (184 lb) IBW/kg (Calculated) : 77.6 HEPARIN DW (KG): 83.5   Vital Signs: Temp: 98 F (36.7 C) (01/22 2045) Temp Source: Axillary (01/22 1657) BP: 114/76 (01/22 2045) Pulse Rate: 71 (01/22 2045)  Labs: Recent Labs    02/08/22 0809 02/08/22 1043  HGB 13.0  --   HCT 40.2  --   PLT 299  --   APTT  --  33  HEPARINUNFRC  --  <0.10*  CREATININE 1.98*  --   TROPONINIHS 5,074* 4,501*     Estimated Creatinine Clearance: 40.8 mL/min (A) (by C-G formula based on SCr of 1.98 mg/dL (H)).   Medical History: Past Medical History:  Diagnosis Date   Anxiety    CHF (congestive heart failure) ([place])    a. EF 15% in 2013 with cath showing normal cors b. EF 30-35% by repeat echo in 6962   Chronic systolic heart failure ([place])    Diabetes mellitus    Fluid retention in legs    Hypertension     Assessment: 67 y.o. M s/p RLE thromboembolectomy 1/22. Heparin was started at [facility] at 1000 units/hr - it was not stopped during the procedure. Pt also given 5000 unit bolus during procedure ~1830. To continue heparin post-op.  Goal of Therapy:  Heparin level 0.3-0.7 units/ml Monitor platelets by anticoagulation protocol: Yes   Plan:  Continue heparin 1000 units/hr Will check heparin level ~6 hr post bolus given during procedure  Sherlon Handing, PharmD, BCPS Please see amion for complete clinical pharmacist phone list 02/08/2022,9:01 PM

## 2022-02-08 NOTE — Anesthesia Procedure Notes (Signed)
Arterial Line Insertion Start/End1/22/2024 5:15 PM, 02/08/2022 5:25 PM Performed by: Dorthea Cove, RN, CRNA  Patient location: Pre-op. Preanesthetic checklist: patient identified, IV checked, site marked, risks and benefits discussed, surgical consent, monitors and equipment checked, pre-op evaluation, timeout performed and anesthesia consent Lidocaine 1% used for infiltration Left, radial was placed Catheter size: 20 G Hand hygiene performed  and maximum sterile barriers used   Attempts: 1 Procedure performed without using ultrasound guided technique. Following insertion, dressing applied and Biopatch. Post procedure assessment: normal and unchanged  Patient tolerated the procedure well with no immediate complications.

## 2022-02-08 NOTE — Anesthesia Preprocedure Evaluation (Addendum)
Anesthesia Evaluation  Patient identified by MRN, date of birth, ID band Patient awake    Reviewed: Allergy & Precautions, NPO status , Patient's Chart, lab work & pertinent test results  Airway Mallampati: II  TM Distance: >3 FB Neck ROM: Full    Dental no notable dental hx. (+) Missing, Partial Lower,    Pulmonary Current Smoker   Pulmonary exam normal breath sounds clear to auscultation       Cardiovascular hypertension, + Peripheral Vascular Disease and +CHF  Normal cardiovascular exam+ dysrhythmias Atrial Fibrillation  Rhythm:Regular Rate:Normal  02/08/2022 Echo 1. Large, mobile apical thrombus measuring 1.9 cm x 0.96 cm. Left  ventricular ejection fraction, by estimation, is <20%. Left ventricular  ejection fraction by PLAX is 12 %. The left ventricle has severely  decreased function. The left ventricle  demonstrates global hypokinesis. There is mild concentric left ventricular  hypertrophy. Left ventricular diastolic parameters are consistent with  Grade III diastolic dysfunction (restrictive). Elevated left ventricular  end-diastolic pressure.   2. Right ventricular systolic function is mildly reduced. The right  ventricular size is normal. There is moderately elevated pulmonary artery  systolic pressure.   3. Left atrial size was severely dilated.   4. Right atrial size was severely dilated.   5. The mitral valve is normal in structure. Mild mitral valve  regurgitation. No evidence of mitral stenosis.   6. The aortic valve is normal in structure. Aortic valve regurgitation is  not visualized. No aortic stenosis is present.   7. Aortic dilatation noted. There is mild dilatation of the ascending  aorta, measuring 42 mm.   8. The inferior vena cava is normal in size with greater than 50%  respiratory variability, suggesting right atrial pressure of 3 mmHg.      Neuro/Psych   Anxiety        GI/Hepatic   Endo/Other   diabetes, Type 2    Renal/GU Renal InsufficiencyRenal diseaseLab Results      Component                Value               Date                      CREATININE               1.98 (H)            02/08/2022                       K                        4.1                 02/08/2022                     Musculoskeletal   Abdominal   Peds  Hematology Lab Results      Component                Value               Date                      WBC                      6.7  02/08/2022                HGB                      13.0                02/08/2022                HCT                      40.2                02/08/2022                MCV                      101.3 (H)           02/08/2022                PLT                      299                 02/08/2022         On Heparin Drip for apical thrombus        Anesthesia Other Findings   Reproductive/Obstetrics                             Anesthesia Physical Anesthesia Plan  ASA: 4 and emergent  Anesthesia Plan: General   Post-op Pain Management: Ofirmev IV (intra-op)*   Induction: Intravenous  PONV Risk Score and Plan: 2 and Treatment may vary due to age or medical condition  Airway Management Planned: Oral ETT  Additional Equipment: Arterial line  Intra-op Plan:   Post-operative Plan: Possible Post-op intubation/ventilation  Informed Consent: I have reviewed the patients History and Physical, chart, labs and discussed the procedure including the risks, benefits and alternatives for the proposed anesthesia with the patient or authorized representative who has indicated his/her understanding and acceptance.     Dental advisory given  Plan Discussed with: CRNA, Surgeon and Anesthesiologist  Anesthesia Plan Comments:        Anesthesia Quick Evaluation

## 2022-02-08 NOTE — Discharge Instructions (Signed)
Patient to be transferred to [facility] emergency department.  Dr. Sharlett Iles excepting and he will be seen by the vascular surgeon Dr. Vella Redhead

## 2022-02-08 NOTE — Consult Note (Addendum)
CONSULT NOTE  MRN : [id number]  Reason for Consult: Right LE ischemic symptoms of foot pain Referring Physician: ED  History of Present Illness: 67 y/o male with a 2 week history of right LE progressive pain.  He states it got more painful to walk, so he just laid around.  He states he is unable to sleep due to the pain, so he came to the ER.    He denies rest pain, non healing wounds or claudication prior to 2 weeks ago.  He states he could walk as fair as her likes.He does report decreased sensation with known DM history.    Past medical history includes: CHF, chronic systolic heart failure, CKD stage III, and HTN.  In Oct. He was found  to have an LV thrombus.  Initially on warfarin, now on Eliquis.  He also has a history of paroxysmal AF, currently in NSR on amiodarone.  He is still smoking 1-2 cigarettes/day.  He drinks a couple beers/week.      Current Facility-Administered Medications  Medication Dose Route Frequency Provider Last Rate Last Admin   heparin ADULT infusion 100 units/mL (25000 units/253m)  1,000 Units/hr Intravenous Continuous ZMilton Ferguson MD 10 mL/hr at 02/08/22 1045 1,000 Units/hr at 02/08/22 1045   Current Outpatient Medications  Medication Sig Dispense Refill   amiodarone (PACERONE) 200 MG tablet Take 0.5 tablets (100 mg total) by mouth daily. 45 tablet 3   levothyroxine (SYNTHROID) 137 MCG tablet Take 137 mcg by mouth every morning.     Magnesium 250 MG TABS Take 250 mg by mouth daily.     metFORMIN (GLUCOPHAGE) 500 MG tablet Take 500 mg by mouth 2 (two) times daily.     metoprolol succinate (TOPROL-XL) 100 MG 24 hr tablet Take 100 mg by mouth daily.     Oxycodone HCl 10 MG TABS Take 10 mg by mouth 3 (three) times daily.     potassium chloride SA (KLOR-CON M) 20 MEQ tablet Take 1 tablet (20 mEq total) by mouth daily. 30 tablet 1   simvastatin (ZOCOR) 20 MG tablet Take 1 tablet (20 mg total) by mouth at bedtime. 30 tablet 1   spironolactone (ALDACTONE) 25 MG  tablet Take 0.5 tablets (12.5 mg total) by mouth daily. 30 tablet 1   torsemide (DEMADEX) 20 MG tablet Take 1 tablet (20 mg total) by mouth daily as needed (fluid). 30 tablet 1   apixaban (ELIQUIS) 5 MG TABS tablet TAKE ONE TABLET BY MOUTH 2 TIMES A DAY (Patient not taking: Reported on 02/08/2022) 60 tablet 5   dapagliflozin propanediol (FARXIGA) 10 MG TABS tablet Take 1 tablet (10 mg total) by mouth daily. (Patient not taking: Reported on 02/08/2022) 30 tablet 11   sacubitril-valsartan (ENTRESTO) 24-26 MG Take 1 tablet by mouth 2 (two) times daily. (Patient not taking: Reported on 02/08/2022) 60 tablet 1    Pt meds include: Statin :Yes Betablocker: No ASA: No Other anticoagulants/antiplatelets: Eliquis on Heparin currently  Past Medical History:  Diagnosis Date   Anxiety    CHF (congestive heart failure) ([place]    a. EF 15% in 2013 with cath showing normal cors b. EF 30-35% by repeat echo in [address]  Chronic systolic heart failure ([place]    Diabetes mellitus    Fluid retention in legs    Hypertension     Past Surgical History:  Procedure Laterality Date   COLONOSCOPY  01/04/2012   Procedure: COLONOSCOPY;  Surgeon: SDanie Binder MD;  [facility];  Service: Endoscopy;  Laterality: N/A;  1:30 PM   HERNIA REPAIR     LEFT AND RIGHT HEART CATHETERIZATION WITH CORONARY ANGIOGRAM N/A 07/09/2011   Procedure: LEFT AND RIGHT HEART CATHETERIZATION WITH CORONARY ANGIOGRAM;  Surgeon: Peter M Martinique, MD;  Location: [facility] CATH LAB;  Service: Cardiovascular;  Laterality: N/A;   RIGHT/LEFT HEART CATH AND CORONARY ANGIOGRAPHY N/A 02/25/2020   Procedure: RIGHT/LEFT HEART CATH AND CORONARY ANGIOGRAPHY;  Surgeon: Larey Dresser, MD;  Location: [facility] CV LAB;  Service: Cardiovascular;  Laterality: N/A;    Social History Social History   Tobacco Use   Smoking status: Some Days    Packs/day: 0.25    Types: Cigarettes, E-cigarettes    Start date: 10/13/1982    Last attempt to quit: 09/19/2011     Years since quitting: 10.3   Smokeless tobacco: Never   Tobacco comments:    electronic cig for 4 months   Vaping Use   Vaping Use: Never used  Substance Use Topics   Alcohol use: Not Currently    Comment: weekends   Drug use: No    Family History Family History  Problem Relation Age of Onset   Heart attack Father    Heart failure Father    Cancer Mother        Multiple myeloma   Heart disease Sister     Allergies  Allergen Reactions   Bee Pollen Swelling     REVIEW OF SYSTEMS  General: '[ ]'$  Weight loss, '[ ]'$  Fever, '[ ]'$  chills Neurologic: '[ ]'$  Dizziness, '[ ]'$  Blackouts, '[ ]'$  Seizure '[ ]'$  Stroke, '[ ]'$  "Mini stroke", '[ ]'$  Slurred speech, '[ ]'$  Temporary blindness; '[ ]'$  weakness in arms or legs, '[ ]'$  Hoarseness '[ ]'$  Dysphagia Cardiac: '[ ]'$  Chest pain/pressure, '[ ]'$  Shortness of breath at rest '[ ]'$  Shortness of breath with exertion, '[ ]'$  Atrial fibrillation or irregular heartbeat  Vascular: [x ] Pain in legs with walking, '[ ]'$  Pain in legs at rest, '[ ]'$  Pain in legs at night,  '[ ]'$  Non-healing ulcer, '[ ]'$  Blood clot in vein/DVT,   Pulmonary: '[ ]'$  Home oxygen, '[ ]'$  Productive cough, '[ ]'$  Coughing up blood, '[ ]'$  Asthma,  '[ ]'$  Wheezing '[ ]'$  COPD Musculoskeletal:  '[ ]'$  Arthritis, '[ ]'$  Low back pain, '[ ]'$  Joint pain Hematologic: '[ ]'$  Easy Bruising, '[ ]'$  Anemia; '[ ]'$  Hepatitis Gastrointestinal: '[ ]'$  Blood in stool, '[ ]'$  Gastroesophageal Reflux/heartburn, Urinary: [ x] chronic Kidney disease, '[ ]'$  on HD - '[ ]'$  MWF or '[ ]'$  TTHS, '[ ]'$  Burning with urination, '[ ]'$  Difficulty urinating Skin: '[ ]'$  Rashes, '[ ]'$  Wounds Psychological: '[ ]'$  Anxiety, '[ ]'$  Depression  Physical Examination Vitals:   02/08/22 1200 02/08/22 1300 02/08/22 1316 02/08/22 1406  BP: 125/87 (!) 127/97  (!) 129/95  Pulse:  88  84  Resp: 16 (!) 27  (!) 24  Temp:   98.3 F (36.8 C)   TempSrc:   Oral   SpO2:  96%  99%  Weight:      Height:       Body mass index is 24.95 kg/m.  General:  WDWN in NAD HENT: WNL Eyes: Pupils equal Pulmonary:  normal non-labored breathing , without Rales, rhonchi,  wheezing Cardiac: RRR, without  Murmurs, rubs or gallops; No carotid bruits Abdomen: soft, NT, no masses Skin: no rashes, ulcers noted;  no Gangrene , no cellulitis; no open wounds;   Vascular Exam/Pulses:Femoral, palpable pulses, right popliteal  doppler signal, left DP multiphasic signals.  No pedal doppler signals.   Musculoskeletal: no muscle wasting or atrophy; no edema  Neurologic: A&O X 3; Appropriate Affect ;  SENSATION: normal; MOTOR FUNCTION:  Motor intact B LE, sensation decreased B feet to light touch Speech is fluent/normal   Significant Diagnostic Studies: CBC Lab Results  Component Value Date   WBC 6.7 02/08/2022   HGB 13.0 02/08/2022   HCT 40.2 02/08/2022   MCV 101.3 (H) 02/08/2022   PLT 299 02/08/2022    BMET    Component Value Date/Time   NA 136 02/08/2022 0809   K 4.1 02/08/2022 0809   CL 104 02/08/2022 0809   CO2 21 (L) 02/08/2022 0809   GLUCOSE 111 (H) 02/08/2022 0809   BUN 22 02/08/2022 0809   CREATININE 1.98 (H) 02/08/2022 0809   CREATININE 0.89 06/05/2014 0928   CALCIUM 8.9 02/08/2022 0809   GFRNONAA 37 (L) 02/08/2022 0809   GFRAA 57 (L) 10/01/2019 1537   Estimated Creatinine Clearance: 40.8 mL/min (A) (by C-G formula based on SCr of 1.98 mg/dL (H)).  COAG Lab Results  Component Value Date   INR 1.7 (H) 07/07/2019   INR 1.8 (H) 07/06/2019   INR 1.6 (H) 07/05/2019     Non-Invasive Vascular Imaging:  RIGHT Lower Extremity   Inflow: Minimal calcified plaque. No aneurysm, dissection, or stenosis. Mild tortuosity.   Outflow: Occlusion of the SFA just beyond its origin extending through its length and through the length of the popliteal artery.   Runoff: Segmental collateral reconstitution of tibial runoff. Indeterminate evaluation of flow across the ankle  ASSESSMENT/PLAN:   Acute on chronic right LE with 2 week history of progressive pain.  He has intact motor, the right foot  is slightly cooler to touch than the left.  No wounds or history of claudication.  He has a popliteal doppler signal, however he has no tibial signals.  The CTA shows evidence of chronic disease with collateral flow.    I placed and order for NPO.  Pending Dr. Jamse Mead exam and plan.   Roxy Horseman 02/08/2022 2:38 PM   I have interviewed and examined patient with PA and agree with assessment and plan above. He appears to have [place] 2a ischemia with significant chance of limb loss without revacularization. I have discussed this with the patient as well as options for revascularization and he agrees to proceed.  Will continue the heparin drip perioperatively and will need postop as this appears to be secondary to discontinuation of anticoagulation with underlying atrial fibrillation.  Surgery will include attempted embolectomy with possible bypass and possible need for fasciotomies.  I discussed with the wife after surgery.  Marishka Rentfrow C. Donzetta Matters, MD [facility] Office: [phone] Pager: [phone]

## 2022-02-08 NOTE — ED Triage Notes (Signed)
Heart pt came in feels like he's "overcompensating" for the pain in right foot, the sole of foot is tender. He is diabetic. Pt states pain was in entire right leg, but now is primarily at foot. Claims foot is cold. Previous infection in right foot. Says its been going on for about 2 weeks

## 2022-02-08 NOTE — Transfer of Care (Signed)
Immediate Anesthesia Transfer of Care Note  Patient: WILLIAM SCHAKE  Procedure(s) Performed: RIGHT LOWER THROMBECTOMY WITH VEIN PATCH (Right: Leg Lower) RIGHT LOWER SAPHENOUS VEIN HARVEST (Right: Leg Lower) TIBIAL PERONEAL ENDARTERECTOMY (Right: Leg Lower)  Patient Location: PACU  Anesthesia Type:General  Level of Consciousness: awake, alert , and oriented  Airway & Oxygen Therapy: Patient Spontanous Breathing and Patient connected to face mask oxygen  Post-op Assessment: Report given to RN and Post -op Vital signs reviewed and stable  Post vital signs: Reviewed and stable  Last Vitals:  Vitals Value Taken Time  BP 131/84 02/08/22 2006  Temp    Pulse 85 02/08/22 2013  Resp 17 02/08/22 2013  SpO2 94 % 02/08/22 2013  Vitals shown include unvalidated device data.  Last Pain:  Vitals:   02/08/22 1657  TempSrc: Axillary  PainSc: 8       Patients Stated Pain Goal: 3 (93/96/88 6484)  Complications: No notable events documented.

## 2022-02-08 NOTE — Assessment & Plan Note (Addendum)
CTA with occlusion of R SFA with severe RLE occlusive disease noted on ABI.  Now POD #1 s/p RLE revascularization with vascular surgery. -Vascular surgery following, appreciate recs -Heparin infusion per pharmacy, to transition to oral anticoagulation -Pain control w/ dilaudid prn, oxycodone as needed -PT/OT -am BMP and CBC -Fall precautions

## 2022-02-08 NOTE — Progress Notes (Signed)
  Echocardiogram 2D Echocardiogram has been performed.  Wynelle Link 02/08/2022, 3:49 PM

## 2022-02-08 NOTE — Consult Note (Signed)
Cardiology Consultation   Patient ID: [name] [name] MRN: [id number]; DOB: [birth year]  Admit date: 02/08/2022 Date of Consult: 02/08/2022  PCP:  [name], NP   [place] Providers Cardiologist:  Carlyle Dolly, MD  Advanced Heart Failure:  Loralie Champagne, MD       Patient Profile:   [name] [name] is a 67 y.o. male with a hx of chronic HFrEF, NICM, CKD 3, LV thrombus, PAD who is being seen 02/08/2022 for the evaluation of elevated troponin at the request of Dr Roderic Palau.  History of Present Illness:   [name] [name] 67 yo male history of chronic HFrEF, NICM, LV thrombus on eliquis but recently noncompliant, afib, CKD 3, NSVT, who presented with right leg pain. He denies any chest pain, SOB/DOE, weight gain, edema, or orthopnea. On ER evaluation troponin checked and found to be significantly elevated at [place], cardiology consulted for evaluation.  Of note reports has been off eliquis 1-2 months due to cost, also has not been taking entresto    WBC 6.7 Hgb 13 Plt 299 BNP 1635 (3 months ago 2200) K 4.1 Cr 1.98 BUN 22  Trop 5074-->4501--> EKG SR, IVCD    09/2021 echo: LVEF <20%, grade III dd, mod RV dysfunction 02/2020 cath:  1. Normal/low filling pressures.  2. Low but not markedly low cardiac output.  3. No obstructive CAD, nonischemic cardiomyopathy.   Past Medical History:  Diagnosis Date   Anxiety    CHF (congestive heart failure) ([place])    a. EF 15% in 2013 with cath showing normal cors b. EF 30-35% by repeat echo in 2035   Chronic systolic heart failure ([place])    Diabetes mellitus    Fluid retention in legs    Hypertension     Past Surgical History:  Procedure Laterality Date   COLONOSCOPY  01/04/2012   Procedure: COLONOSCOPY;  Surgeon: Danie Binder, MD;  [facility];  Service: Endoscopy;  Laterality: N/A;  1:30 PM   HERNIA REPAIR     LEFT AND RIGHT HEART CATHETERIZATION WITH CORONARY ANGIOGRAM N/A 07/09/2011   Procedure: LEFT AND  RIGHT HEART CATHETERIZATION WITH CORONARY ANGIOGRAM;  Surgeon: Peter M Martinique, MD;  Location: [facility] CATH LAB;  Service: Cardiovascular;  Laterality: N/A;   RIGHT/LEFT HEART CATH AND CORONARY ANGIOGRAPHY N/A 02/25/2020   Procedure: RIGHT/LEFT HEART CATH AND CORONARY ANGIOGRAPHY;  Surgeon: Larey Dresser, MD;  Location: [facility] CV LAB;  Service: Cardiovascular;  Laterality: N/A;      Inpatient Medications: Scheduled Meds:  Continuous Infusions:  heparin 1,000 Units/hr (02/08/22 1045)   PRN Meds:   Allergies:    Allergies  Allergen Reactions   Bee Pollen Swelling    Social History:   Social History   Socioeconomic History   Marital status: Married    Spouse name: Not on file   Number of children: 0   Years of education: Not on file   Highest education level: Not on file  Occupational History   Occupation: disabled  Tobacco Use   Smoking status: Some Days    Packs/day: 0.25    Types: Cigarettes, E-cigarettes    Start date: 10/13/1982    Last attempt to quit: 09/19/2011    Years since quitting: 10.3   Smokeless tobacco: Never   Tobacco comments:    electronic cig for 4 months   Vaping Use   Vaping Use: Never used  Substance and Sexual Activity   Alcohol use: Not Currently    Comment:  weekends   Drug use: No   Sexual activity: Not on file  Other Topics Concern   Not on file  Social History Narrative   Patient is right handed.   Patient seldom drinks caffeine.   Social Determinants of Health   Financial Resource Strain: Not on file  Food Insecurity: No Food Insecurity (10/17/2021)   Hunger Vital Sign    Worried About Running Out of Food in the Last Year: Never true    Ran Out of Food in the Last Year: Never true  Transportation Needs: No Transportation Needs (10/17/2021)   PRAPARE - Hydrologist (Medical): No    Lack of Transportation (Non-Medical): No  Physical Activity: Not on file  Stress: Not on file  Social Connections: Not on  file  Intimate Partner Violence: Not At Risk (10/17/2021)   Humiliation, Afraid, Rape, and Kick questionnaire    Fear of Current or Ex-Partner: No    Emotionally Abused: No    Physically Abused: No    Sexually Abused: No    Family History:    Family History  Problem Relation Age of Onset   Heart attack Father    Heart failure Father    Cancer Mother        Multiple myeloma   Heart disease Sister      ROS:  Please see the history of present illness.   All other ROS reviewed and negative.     Physical Exam/Data:   Vitals:   02/08/22 1045 02/08/22 1100 02/08/22 1130 02/08/22 1200  BP: 106/73 (!) 124/92 (!) 122/90 125/87  Pulse: 85 89    Resp: (!) 32 (!) 30 (!) 23 16  Temp:      TempSrc:      SpO2: 97% 97%    Weight:      Height:       No intake or output data in the 24 hours ending 02/08/22 1256    02/08/2022    7:50 AM 10/19/2021    5:00 AM 10/18/2021    5:26 AM  Last 3 Weights  Weight (lbs) 184 lb 198 lb 6.6 oz 198 lb 3.1 oz  Weight (kg) 83.462 kg 90 kg 89.9 kg     Body mass index is 24.95 kg/m.  General:  Well nourished, well developed, in no acute distress HEENT: normal Neck: no JVD Vascular: No carotid bruits; Distal pulses 2+ bilaterally Cardiac:  normal S1, S2; RRR; no murmur  Lungs:  clear to auscultation bilaterally, no wheezing, rhonchi or rales  Abd: soft, nontender, no hepatomegaly  Ext: no edema Musculoskeletal:  No deformities, BUE and BLE strength normal and equal Skin: warm and dry  Neuro:  CNs 2-12 intact, no focal abnormalities noted Psych:  Normal affect     Laboratory Data:  High Sensitivity Troponin:   Recent Labs  Lab 02/08/22 0809 02/08/22 1043  TROPONINIHS 5,074* 4,501*     Chemistry Recent Labs  Lab 02/08/22 0809  NA 136  K 4.1  CL 104  CO2 21*  GLUCOSE 111*  BUN 22  CREATININE 1.98*  CALCIUM 8.9  GFRNONAA 37*  ANIONGAP 11    Recent Labs  Lab 02/08/22 0809  PROT 7.8  ALBUMIN 3.5  AST 31  ALT 12  ALKPHOS  91  BILITOT 0.7   Lipids No results for input(s): "CHOL", "TRIG", "HDL", "LABVLDL", "LDLCALC", "CHOLHDL" in the last 168 hours.  Hematology Recent Labs  Lab 02/08/22 0809  WBC 6.7  RBC 3.97*  HGB 13.0  HCT 40.2  MCV 101.3*  MCH 32.7  MCHC 32.3  RDW 13.4  PLT 299   Thyroid No results for input(s): "TSH", "FREET4" in the last 168 hours.  BNP Recent Labs  Lab 02/08/22 1043  BNP 1,635.0*    DDimer No results for input(s): "DDIMER" in the last 168 hours.   Radiology/Studies:  CT ANGIO AO+BIFEM W & OR WO CONTRAST  Result Date: 02/08/2022 CLINICAL DATA:  Right foot cold and painful x1 week EXAM: CT ANGIOGRAPHY OF ABDOMINAL AORTA WITH ILIOFEMORAL RUNOFF TECHNIQUE: Multidetector CT imaging of the abdomen, pelvis and lower extremities was performed using the standard protocol during bolus administration of intravenous contrast. Multiplanar CT image reconstructions and MIPs were obtained to evaluate the vascular anatomy. RADIATION DOSE REDUCTION: This exam was performed according to the departmental dose-optimization program which includes automated exposure control, adjustment of the mA and/or kV according to patient size and/or use of iterative reconstruction technique. CONTRAST:  73m OMNIPAQUE IOHEXOL 350 MG/ML SOLN COMPARISON:  CT 06/28/2006 FINDINGS: VASCULAR Aorta: Mild calcified atheromatous plaque. No aneurysm, dissection, or stenosis. Celiac: Patent without evidence of aneurysm, dissection, vasculitis or significant stenosis. SMA: Patent without evidence of aneurysm, dissection, vasculitis or significant stenosis. Renals: Both renal arteries are patent without evidence of aneurysm, dissection, vasculitis, fibromuscular dysplasia or significant stenosis. IMA: Patent without evidence of aneurysm, dissection, vasculitis or significant stenosis. RIGHT Lower Extremity Inflow: Minimal calcified plaque. No aneurysm, dissection, or stenosis. Mild tortuosity. Outflow: Occlusion of the SFA just  beyond its origin extending through its length and through the length of the popliteal artery. Runoff: Segmental collateral reconstitution of tibial runoff. Indeterminate evaluation of flow across the ankle LEFT Lower Extremity Inflow: Minimal calcified atheromatous plaque. No aneurysm, dissection, or stenosis. Mild tortuosity. Outflow: Common femoral deep femoral branches normal. Incomplete contrast opacification of the SFA. Very limited contrast opacification of the popliteal artery, such that significant stenosis or distal occlusion cannot be confidently excluded. Runoff: No distal contrast opacification limiting evaluation Veins: No obvious venous abnormality within the limitations of this arterial phase study. Review of the MIP images confirms the above findings. NON-VASCULAR Lower chest: Moderate bilateral pleural effusions, new since previous. Adjacent consolidation/atelectasis posteriorly in the visualized lung bases. Hepatobiliary: Gallbladder nondilated. No calcified gallstones or biliary ductal dilatation. Stable hepatic cysts. Pancreas: Unremarkable. No pancreatic ductal dilatation or surrounding inflammatory changes. Spleen: Normal in size. Heterogenous opacification presumably related to early scan timing. Adrenals/Urinary Tract: No adrenal mass. Early arterial phase renal opacification without evidence of focal lesion. No hydronephrosis. Urinary bladder incompletely distended. Stomach/Bowel: Stomach and small bowel are nondistended. Normal appendix. The colon is nondilated. Scattered distal descending and proximal sigmoid diverticula. No focal wall thickening or regional inflammatory change. Lymphatic: No abdominal or pelvic adenopathy. Reproductive: Mild prostate enlargement. Other: Left pelvic phlebolith.  No ascites.  No free air. Musculoskeletal: Advanced degenerative disc disease L3-L5. No fracture or worrisome bone lesion. IMPRESSION: 1. 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 Incomplete collateral reconstitution of tibial runoff. 2. Moderate bilateral pleural effusions, new since previous. 3. Descending and sigmoid diverticulosis. 4.  Aortic Atherosclerosis (ICD10-I70.0). Electronically Signed   By: DLucrezia EuropeM.D.   On: 02/08/2022 12:15   DG Chest Port 1 View  Result Date: 02/08/2022 CLINICAL DATA:  Weakness SOB pain EXAM: PORTABLE CHEST 1 VIEW COMPARISON:  10/19/2021 FINDINGS: The heart size and mediastinal moderate right-sided pleural effusion unchanged. Prominent cardiac silhouette. No pneumothorax. Mild vascular congestion. No  pneumothorax. IMPRESSION: Mild CHF and right-sided pleural effusion. Electronically Signed   By: Sammie Bench M.D.   On: 02/08/2022 10:12     Assessment and Plan:   1.Limb ischemia - presented with severe right leg pain starting about 2 weeks ago - CTA shows long segment occlusion of the RIGHT SFA and popliteal artery, maybe atherosclerotic or embolic. Incomplete collateral reconstitutionof tibial runoff. Severe right sided disease by ABI - ER already in touch with vascular, patient for urgent transfer to [facility] ER for evalautin - history of afib and LV thrombus has been noncompliant with his eliquis, I suspect he likely had a cardioembolic event.    2. Elevated troponin - trop up to 5000, no ischemic EKG changes - patient has not had any cardiopulmonary symptoms - cath 2022 without significant CAD - I suspect he may have not only embolized to his leg but perhaps to his coronaries as the etiology of his elevated troponin - trop trending down.  - continue heparin, follow renal function and progress of his leg ischemia which would determine if any additional cardiac testing would be performed. Favor medical management at this time.  -could start ASA while on heparin if not an issue with any vascular surgery plans.    3.Chronic HFrEF - LVEF <20%, followed in HF clinic - he has refused  AICD - RHC/LHC in 2/22 showed no significant CAD, low filling pressures, and CI 2.04.  - troubles affording entresto, has not been taking. Reports compliance with other meds - home weights stable, no SOB/DOE/LE edema/orthopnea - bnp below baseline, appears euvolemic on exam.   -continue home cardiac meds. Hold on restarting entresto for now given possibility of lower extremity angiogram and dye load in the setting of underlying CKD.     4. PAF - has been on amio,topol, and eliquis though noncompliant with anticoag - continue amio, toprol. On hep gtt at this time   5. LV thrombus - on eliquis, ran out few weeks ago and has been noncompliant - repeat echo   He is euvolemic. Over decade history of severe systolic dysfunction, cath last year without significant CAD. He is at increased risk but not prohibitive risk for any form of vascular intervention. I think current trop is likely cardiomebolic related and would not hold patient back from any procedure that may salvage his leg.     For questions or updates, please contact [place] Please consult [email or web address] for contact info under    Signed, Carlyle Dolly, MD  02/08/2022 12:56 PM

## 2022-02-08 NOTE — ED Notes (Signed)
Pt requesting pain meds for foot. EDP aware.

## 2022-02-08 NOTE — H&P (Addendum)
Hospital Admission History and Physical Service Pager: [phone]  Patient name: [name] [name] Medical record number: [id number] Date of Birth: 04-21-1955 Age: 67 y.o. Gender: male  Primary Care Provider: [name], NP Consultants: Vascular Surgery  Code Status: Full code Preferred Emergency Contact:  Contact Information     Name Relation Home Work Roe Spouse [phone]  [phone]        Chief Complaint: Right leg pain  Assessment and Plan: [name] [name] is a 67 y.o. male presenting with right leg pain, transferred from [facility] after being found to have RLE ischemia with occlusion of right SFA; history of suboptimal anticoagulation in the setting of chronic cardiovascular disease.  Pertinent PMHx includes CHF, CKD 3, HTN, LV thrombus, PAF, T2DM  * Ischemic leg P/w cold and painful R leg. CTA shows occlusion of R SFA with severe RLE occlusive disease noted on ABI. May represent embolism in the setting of being off anticoagulation for PAF; active LV thrombus noted on echo. Vitals are stable. Evaluated by vascular surgery who recommended medical admission and plan for surgery. -Admit to FMT[facility] attending Dr. McDiarmid -Vascular surgery following, appreciate recs. Planning for surgery -Heparin infusion per pharmacy started in ED -Pain control w/ dilaudid prn -PT/OT -am BMP and CBC -Fall precautions  PAF (paroxysmal atrial fibrillation) (HCC) Hx of the same, on eliquis but reportedly ran out and has not been taking it for a few weeks. Currently in NSR. Active LV thrombus as noted -Appreciate cardiology recs -Continue home amiodarone, toprol -Cont heparin -Cardiac monitoring  CHF (congestive heart failure) (HCC) EF <20% with G3DD. BNP elevated on admission to 1635. CXR reflecting mild CHF/vascular congestion and R pleural effusion. B/l pleural effusions noted on CTA. Reassuringly appears euvolemic, on room air, not in respiratory distress  and without overt symptoms of acute CHF. -Cardiology following, appreciate recs -holding diuresis, farxiga  Elevated troponin Troponin 8502>7741. Seen by cardiology at [facility] who felt that this may represent possible embolism to coronary arteries in the setting of being off anticoagulation for PAF. Started on heparin infusion. EKG without ischemic changes. -Appreciate cardiology recs -continue heparin -per cardiology, consider ASA if ok with vascular  Left ventricular thrombus Mobile apical thrombus noted in LV on echo upon admission. Pt has a hx of LV thrombus in the past. Previously on coumadin, now on eliquis but has been unable to take this within the last few weeks due to cost. Spoke with Dr. Harl Bowie with cardiology who evaluated pt at [facility] - still ok to move forward with surgery -Continue heparin   Chronic renal failure, stage 3a (HCC) Creatine 1.98, near baseline.  -Avoid nephrotoxic agents.   Type 2 diabetes with nephropathy ([place]) Well controlled, last A1c of 6.1 on 10/17/2021.  -Holding home metformin -Can start SSI after surgery if needed     Chronic: Hypothyroid: home synthroid   FEN/GI: N.p.o. (planning for surgery) VTE Prophylaxis: Heparin  Disposition: Admit daily  History of Present Illness:  [name] [name] is a 67 y.o. male presenting with R leg and foot pain which started 2 weeks ago and has progressively worsened. Also complains of intermittent SOB with exertion. Does not use supplemental O2 at home. Denies fevers, back pain, abdominal pain, nausea, vomiting, headache, vision changes.  Denies significant orthopnea, PND, leg swelling.  Hasn't taken eliquis, farxiga, or entresto for past couple weeks  Transferred from [facility] for ischemic right leg. Found to have occlusion of right SFA and  popliteal artery on CTA.ABI with severe RLE occlusive disease. Vascular surgery consulted and following.   Also noted to have elevated troponin to 5074 > 4501  and elevated BNP 1635. CXR with mild CHF. Moderate b/l pleural effusions also found on CTA. Cardiology consulted. Started on heparin.   Review Of Systems: Per HPI  Pertinent Past Medical History: CHF CKD 3 HTN Hx LV thrombus PAF T2DM  Remainder reviewed in history tab.   Pertinent Past Surgical History: Hernia repair  Remainder reviewed in history tab.   Pertinent Social History: Tobacco use: Yes - 1/2 PPD for over 20 years,  Alcohol use: 1 to a couple beers a couple times a week Other Substance use: No Lives with wife and step son  Pertinent Family History: Mother- cancer Father- heart disease Remainder reviewed in history tab.   Important Outpatient Medications: Amiodarone  Toprol-XL Synthroid Zocor Aldactone Metformin Magnesium Potassium chloride PRN torsemide Eliquis (not taking) Farxiga (not taking) Entresto (not taking) Remainder reviewed in medication history.   Objective: BP 124/74   Pulse 80   Temp (!) 96.9 F (36.1 C) (Axillary)   Resp (!) 22   Ht 6' (1.829 m)   Wt 83.5 kg   SpO2 95%   BMI 24.95 kg/m  Exam: General: NAD, pleasant, able to participate in exam Cardiac: RRR, no murmurs auscultated Respiratory: CTAB, normal WOB on RA Abdomen: soft, non-tender, non-distended, normoactive bowel sounds Extremities: RLE significantly colder to touch than LLE. R DP pulse faint. 5/5 strength bilaterally, sensation intact.    Labs:  CBC BMET  Recent Labs  Lab 02/08/22 0809  WBC 6.7  HGB 13.0  HCT 40.2  PLT 299   Recent Labs  Lab 02/08/22 0809  NA 136  K 4.1  CL 104  CO2 21*  BUN 22  CREATININE 1.98*  GLUCOSE 111*  CALCIUM 8.9     BNP 1635 Troponin 5074 > 4501  EKG: NSR no acute ischemic changes   Imaging Studies Performed:  ECHOCARDIOGRAM COMPLETE  Result Date: 02/08/2022    ECHOCARDIOGRAM REPORT   Patient Name:   [name] [name] Date of Exam: 02/08/2022 Medical Rec #:  [id number]        Height:       72.0 in Accession #:     [id number]       Weight:       184.0 lb Date of Birth:  Oct 22, 1955       BSA:          2.056 m Patient Age:    11 years         BP:           119/98 mmHg Patient Gender: M                HR:           69 bpm. Exam Location:  Inpatient Procedure: 2D Echo, Cardiac Doppler, Color Doppler and Intracardiac            Opacification Agent Indications:    Chest Pain R07.9                  Appointment Information  History:        Patient has prior history of Echocardiogram examinations, most                 recent 10/17/2021. CHF and Cardiomyopathy, Signs/Symptoms:Chest                 Pain; Risk Factors:Hypertension,  Diabetes, Dyslipidemia and                 Current Smoker.  Sonographer:    Greer Pickerel Referring Phys: [id number] Alphonse Guild BRANCH  Sonographer Comments: Image acquisition challenging due to respiratory motion. IMPRESSIONS  1. Large, mobile apical thrombus measuring 1.9 cm x 0.96 cm. Left ventricular ejection fraction, by estimation, is <20%. Left ventricular ejection fraction by PLAX is 12 %. The left ventricle has severely decreased function. The left ventricle demonstrates global hypokinesis. There is mild concentric left ventricular hypertrophy. Left ventricular diastolic parameters are consistent with Grade III diastolic dysfunction (restrictive). Elevated left ventricular end-diastolic pressure.  2. Right ventricular systolic function is mildly reduced. The right ventricular size is normal. There is moderately elevated pulmonary artery systolic pressure.  3. Left atrial size was severely dilated.  4. Right atrial size was severely dilated.  5. The mitral valve is normal in structure. Mild mitral valve regurgitation. No evidence of mitral stenosis.  6. The aortic valve is normal in structure. Aortic valve regurgitation is not visualized. No aortic stenosis is present.  7. Aortic dilatation noted. There is mild dilatation of the ascending aorta, measuring 42 mm.  8. The inferior vena cava is normal in size  with greater than 50% respiratory variability, suggesting right atrial pressure of 3 mmHg. FINDINGS  Left Ventricle: Large, mobile apical thrombus measuring 1.9 cm x 0.96 cm. Left ventricular ejection fraction, by estimation, is <20%. Left ventricular ejection fraction by PLAX is 12 %. The left ventricle has severely decreased function. The left ventricle demonstrates global hypokinesis. Definity contrast agent was given IV to delineate the left ventricular endocardial borders. The left ventricular internal cavity size was normal in size. There is mild concentric left ventricular hypertrophy. Left ventricular diastolic parameters are consistent with Grade III diastolic dysfunction (restrictive). Elevated left ventricular end-diastolic pressure. Right Ventricle: The right ventricular size is normal. No increase in right ventricular wall thickness. Right ventricular systolic function is mildly reduced. There is moderately elevated pulmonary artery systolic pressure. The tricuspid regurgitant velocity is 3.56 m/s, and with an assumed right atrial pressure of 3 mmHg, the estimated right ventricular systolic pressure is 44.9 mmHg. Left Atrium: Left atrial size was severely dilated. Right Atrium: Right atrial size was severely dilated. Pericardium: There is no evidence of pericardial effusion. Mitral Valve: The mitral valve is normal in structure. Mild mitral valve regurgitation. No evidence of mitral valve stenosis. Tricuspid Valve: The tricuspid valve is normal in structure. Tricuspid valve regurgitation is trivial. No evidence of tricuspid stenosis. Aortic Valve: The aortic valve is normal in structure. Aortic valve regurgitation is not visualized. No aortic stenosis is present. Pulmonic Valve: The pulmonic valve was normal in structure. Pulmonic valve regurgitation is not visualized. No evidence of pulmonic stenosis. Aorta: Aortic dilatation noted. There is mild dilatation of the ascending aorta, measuring 42 mm.  Venous: The inferior vena cava is normal in size with greater than 50% respiratory variability, suggesting right atrial pressure of 3 mmHg. IAS/Shunts: No atrial level shunt detected by color flow Doppler.  LEFT VENTRICLE PLAX 2D LV EF:         Left            Diastology                ventricular     LV e' medial:    4.67 cm/s                ejection  LV E/e' medial:  24.0                fraction by     LV e' lateral:   6.12 cm/s                PLAX is 12      LV E/e' lateral: 18.3                %. LVIDd:         5.50 cm LVIDs:         5.20 cm LV PW:         1.30 cm LV IVS:        1.10 cm LVOT diam:     1.90 cm LV SV:         31 LV SV Index:   15 LVOT Area:     2.84 cm  RIGHT VENTRICLE RV S prime:     9.36 cm/s TAPSE (M-mode): 1.8 cm LEFT ATRIUM              Index        RIGHT ATRIUM           Index LA diam:        4.60 cm  2.24 cm/m   RA Area:     21.80 cm LA Vol (A2C):   136.0 ml 66.14 ml/m  RA Volume:   82.40 ml  40.07 ml/m LA Vol (A4C):   96.2 ml  46.78 ml/m LA Biplane Vol: 127.0 ml 61.76 ml/m  AORTIC VALVE             PULMONIC VALVE LVOT Vmax:   71.80 cm/s  PR End Diast Vel: 7.84 msec LVOT Vmean:  45.600 cm/s LVOT VTI:    0.109 m  AORTA Ao Root diam: 4.10 cm Ao Asc diam:  4.20 cm MITRAL VALVE                TRICUSPID VALVE MV Area (PHT): 5.75 cm     TR Peak grad:   50.7 mmHg MV Decel Time: 132 msec     TR Vmax:        356.00 cm/s MR Peak grad: 54.2 mmHg MR Vmax:      368.00 cm/s   SHUNTS MV E velocity: 112.00 cm/s  Systemic VTI:  0.11 m MV A velocity: 26.60 cm/s   Systemic Diam: 1.90 cm MV E/A ratio:  4.21 Skeet Latch MD Electronically signed by Skeet Latch MD Signature Date/Time: 02/08/2022/3:53:15 PM    Final    US ARTERIAL ABI (SCREENING LOWER EXTREMITY)  Result Date: 02/08/2022 CLINICAL DATA:  Right foot cold, pain x2 weeks EXAM: NONINVASIVE PHYSIOLOGIC VASCULAR STUDY OF BILATERAL LOWER EXTREMITIES TECHNIQUE: Evaluation of both lower extremities were performed at rest, including  calculation of ankle-brachial indices with single level Doppler, pressure and pulse volume recording. COMPARISON:  CTA from the same day FINDINGS: Right ABI:  0.36 Left ABI:  1.33 Right Lower Extremity:  Abnormal monophasic distal waveforms. Left Lower Extremity: Multiphasic waveform in the dorsalis pedis, monophasic in posterior tibial. IMPRESSION: 1. 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 See concurrent findings on CTA, reported separately. 2. Left ABI normal at rest. Electronically Signed   By: Lucrezia Europe M.D.   On: 02/08/2022 13:23   CT ANGIO AO+BIFEM W & OR WO CONTRAST  Result Date: 02/08/2022 CLINICAL DATA:  Right foot cold and painful x1 week EXAM: CT ANGIOGRAPHY OF ABDOMINAL AORTA WITH ILIOFEMORAL RUNOFF TECHNIQUE:  Multidetector CT imaging of the abdomen, pelvis and lower extremities was performed using the standard protocol during bolus administration of intravenous contrast. Multiplanar CT image reconstructions and MIPs were obtained to evaluate the vascular anatomy. RADIATION DOSE REDUCTION: This exam was performed according to the departmental dose-optimization program which includes automated exposure control, adjustment of the mA and/or kV according to patient size and/or use of iterative reconstruction technique. CONTRAST:  34m OMNIPAQUE IOHEXOL 350 MG/ML SOLN COMPARISON:  CT 06/28/2006 FINDINGS: VASCULAR Aorta: Mild calcified atheromatous plaque. No aneurysm, dissection, or stenosis. Celiac: Patent without evidence of aneurysm, dissection, vasculitis or significant stenosis. SMA: Patent without evidence of aneurysm, dissection, vasculitis or significant stenosis. Renals: Both renal arteries are patent without evidence of aneurysm, dissection, vasculitis, fibromuscular dysplasia or significant stenosis. IMA: Patent without evidence of aneurysm, dissection, vasculitis or significant stenosis. RIGHT Lower Extremity Inflow: Minimal calcified plaque. No aneurysm, dissection, or  stenosis. Mild tortuosity. Outflow: Occlusion of the SFA just beyond its origin extending through its length and through the length of the popliteal artery. Runoff: Segmental collateral reconstitution of tibial runoff. Indeterminate evaluation of flow across the ankle LEFT Lower Extremity Inflow: Minimal calcified atheromatous plaque. No aneurysm, dissection, or stenosis. Mild tortuosity. Outflow: Common femoral deep femoral branches normal. Incomplete contrast opacification of the SFA. Very limited contrast opacification of the popliteal artery, such that significant stenosis or distal occlusion cannot be confidently excluded. Runoff: No distal contrast opacification limiting evaluation Veins: No obvious venous abnormality within the limitations of this arterial phase study. Review of the MIP images confirms the above findings. NON-VASCULAR Lower chest: Moderate bilateral pleural effusions, new since previous. Adjacent consolidation/atelectasis posteriorly in the visualized lung bases. Hepatobiliary: Gallbladder nondilated. No calcified gallstones or biliary ductal dilatation. Stable hepatic cysts. Pancreas: Unremarkable. No pancreatic ductal dilatation or surrounding inflammatory changes. Spleen: Normal in size. Heterogenous opacification presumably related to early scan timing. Adrenals/Urinary Tract: No adrenal mass. Early arterial phase renal opacification without evidence of focal lesion. No hydronephrosis. Urinary bladder incompletely distended. Stomach/Bowel: Stomach and small bowel are nondistended. Normal appendix. The colon is nondilated. Scattered distal descending and proximal sigmoid diverticula. No focal wall thickening or regional inflammatory change. Lymphatic: No abdominal or pelvic adenopathy. Reproductive: Mild prostate enlargement. Other: Left pelvic phlebolith.  No ascites.  No free air. Musculoskeletal: Advanced degenerative disc disease L3-L5. No fracture or worrisome bone lesion. IMPRESSION:  1. 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 Incomplete collateral reconstitution of tibial runoff. 2. Moderate bilateral pleural effusions, new since previous. 3. Descending and sigmoid diverticulosis. 4.  Aortic Atherosclerosis (ICD10-I70.0). Electronically Signed   By: DLucrezia EuropeM.D.   On: 02/08/2022 12:15   DG Chest Port 1 View  Result Date: 02/08/2022 CLINICAL DATA:  Weakness SOB pain EXAM: PORTABLE CHEST 1 VIEW COMPARISON:  10/19/2021 FINDINGS: The heart size and mediastinal moderate right-sided pleural effusion unchanged. Prominent cardiac silhouette. No pneumothorax. Mild vascular congestion. No pneumothorax. IMPRESSION: Mild CHF and right-sided pleural effusion. Electronically Signed   By: JSammie BenchM.D.   On: 02/08/2022 10:12      JPrecious Gilding DO 02/08/2022, 6:00 PM PGY-1, CAndersonIntern pager: [phone] text pages welcome Secure chat group [place]  I was personally present, also performed the exam and assisted in medical decision making and verified the service and findings are accurately documented in the intern's note.  SPrecious Gilding DO. PGY 2 02/08/2022 6:00 PM

## 2022-02-08 NOTE — ED Provider Notes (Signed)
[facility] Provider Note   CSN: [id number] Arrival date & time: 02/08/22  4098     History  Chief Complaint  Patient presents with   Foot Pain    Primarily in right foot and goes up right leg somewhat he says and feels tight    DOVBER ERNEST is a 67 y.o. male.  Patient has a history of hypertension diabetes cardiomyopathy congestive heart failure he also has a left ventricular thrombus that he takes Eliquis for.  Patient states he ran out of it a couple weeks ago and since that time he has been having pain in his right leg from the knee down.  This pain became worse 4 days ago.  Patient also complains of having no energy  The history is provided by the patient and medical records. No language interpreter was used.  Foot Pain This is a new problem. Episode onset: 2 weeks but worse last 4 days. The problem occurs constantly. Pertinent negatives include no chest pain, no abdominal pain and no headaches. Nothing aggravates the symptoms.       Home Medications Prior to Admission medications   Medication Sig Start Date End Date Taking? Authorizing Provider  amiodarone (PACERONE) 200 MG tablet Take 0.5 tablets (100 mg total) by mouth daily. 06/01/21  Yes Larey Dresser, MD  levothyroxine (SYNTHROID) 137 MCG tablet Take 137 mcg by mouth every morning. 11/09/20  Yes [provider]  Magnesium 250 MG TABS Take 250 mg by mouth daily.   Yes [provider]  metFORMIN (GLUCOPHAGE) 500 MG tablet Take 500 mg by mouth 2 (two) times daily. 04/20/19  Yes [provider]  metoprolol succinate (TOPROL-XL) 100 MG 24 hr tablet Take 100 mg by mouth daily. 02/03/22  Yes [provider]  Oxycodone HCl 10 MG TABS Take 10 mg by mouth 3 (three) times daily. 04/11/19  Yes [provider]  potassium chloride SA (KLOR-CON M) 20 MEQ tablet Take 1 tablet (20 mEq total) by mouth daily. 10/19/21  Yes Johnson, Clanford L, MD   simvastatin (ZOCOR) 20 MG tablet Take 1 tablet (20 mg total) by mouth at bedtime. 10/19/21  Yes Johnson, Clanford L, MD  spironolactone (ALDACTONE) 25 MG tablet Take 0.5 tablets (12.5 mg total) by mouth daily. 10/19/21  Yes Johnson, Clanford L, MD  torsemide (DEMADEX) 20 MG tablet Take 1 tablet (20 mg total) by mouth daily as needed (fluid). 10/19/21  Yes Johnson, Clanford L, MD  apixaban (ELIQUIS) 5 MG TABS tablet TAKE ONE TABLET BY MOUTH 2 TIMES A DAY Patient not taking: Reported on 02/08/2022 06/30/21   Arnoldo Lenis, MD  dapagliflozin propanediol (FARXIGA) 10 MG TABS tablet Take 1 tablet (10 mg total) by mouth daily. Patient not taking: Reported on 02/08/2022 09/29/21   Larey Dresser, MD  sacubitril-valsartan (ENTRESTO) 24-26 MG Take 1 tablet by mouth 2 (two) times daily. Patient not taking: Reported on 02/08/2022 10/19/21   Murlean Iba, MD      Allergies    Bee pollen    Review of Systems   Review of Systems  Constitutional:  Positive for fatigue. Negative for appetite change.  HENT:  Negative for congestion, ear discharge and sinus pressure.   Eyes:  Negative for discharge.  Respiratory:  Negative for cough.   Cardiovascular:  Negative for chest pain.  Gastrointestinal:  Negative for abdominal pain and diarrhea.  Genitourinary:  Negative for frequency and hematuria.  Musculoskeletal:  Negative for  back pain.       Left foot pain  Skin:  Negative for rash.  Neurological:  Negative for seizures and headaches.  Psychiatric/Behavioral:  Negative for hallucinations.     Physical Exam Updated Vital Signs BP 125/87   Pulse 89   Temp 98.4 F (36.9 C) (Oral)   Resp 16   Ht 6' (1.829 m)   Wt 83.5 kg   SpO2 97%   BMI 24.95 kg/m  Physical Exam Vitals and nursing note reviewed.  Constitutional:      Appearance: He is well-developed.  HENT:     Head: Normocephalic.     Nose: Nose normal.  Eyes:     General: No scleral icterus.    Conjunctiva/sclera: Conjunctivae  normal.  Neck:     Thyroid: No thyromegaly.  Cardiovascular:     Rate and Rhythm: Normal rate and regular rhythm.     Heart sounds: No murmur heard.    No friction rub. No gallop.  Pulmonary:     Breath sounds: No stridor. No wheezing or rales.  Chest:     Chest wall: No tenderness.  Abdominal:     General: There is no distension.     Tenderness: There is no abdominal tenderness. There is no rebound.  Musculoskeletal:        General: Normal range of motion.     Cervical back: Neck supple.     Comments: Patient with foot on the right side is cooler than the left side.  Also I cannot feel dorsalis pedis or posterior tibial pulse.  Patient with poor cap refill  Lymphadenopathy:     Cervical: No cervical adenopathy.  Skin:    Findings: No erythema or rash.  Neurological:     Mental Status: He is alert and oriented to person, place, and time.     Motor: No abnormal muscle tone.     Coordination: Coordination normal.  Psychiatric:        Behavior: Behavior normal.     ED Results / Procedures / Treatments   Labs (all labs ordered are listed, but only abnormal results are displayed) Labs Reviewed  CBC WITH DIFFERENTIAL/PLATELET - Abnormal; Notable for the following components:      Result Value   RBC 3.97 (*)    MCV 101.3 (*)    All other components within normal limits  COMPREHENSIVE METABOLIC PANEL - Abnormal; Notable for the following components:   CO2 21 (*)    Glucose, Bld 111 (*)    Creatinine, Ser 1.98 (*)    GFR, Estimated 37 (*)    All other components within normal limits  BRAIN NATRIURETIC PEPTIDE - Abnormal; Notable for the following components:   B Natriuretic Peptide 1,635.0 (*)    All other components within normal limits  HEPARIN LEVEL (UNFRACTIONATED) - Abnormal; Notable for the following components:   Heparin Unfractionated <0.10 (*)    All other components within normal limits  TROPONIN I (HIGH SENSITIVITY) - Abnormal; Notable for the following  components:   Troponin I (High Sensitivity) 5,074 (*)    All other components within normal limits  TROPONIN I (HIGH SENSITIVITY) - Abnormal; Notable for the following components:   Troponin I (High Sensitivity) 4,501 (*)    All other components within normal limits  APTT  HEPARIN LEVEL (UNFRACTIONATED)    EKG EKG Interpretation  Date/Time:  Monday February 08 2022 08:33:42 EST Ventricular Rate:  83 PR Interval:  186 QRS Duration: 136 QT Interval:  439 QTC  Calculation: 516 R Axis:   -77 Text Interpretation: Sinus rhythm Paired ventricular premature complexes IVCD, consider atypical RBBB Inferior infarct, old Lateral leads are also involved Confirmed by Milton Ferguson [phone]) on 02/08/2022 12:41:00 PM  Radiology CT ANGIO AO+BIFEM W & OR WO CONTRAST  Result Date: 02/08/2022 CLINICAL DATA:  Right foot cold and painful x1 week EXAM: CT ANGIOGRAPHY OF ABDOMINAL AORTA WITH ILIOFEMORAL RUNOFF TECHNIQUE: Multidetector CT imaging of the abdomen, pelvis and lower extremities was performed using the standard protocol during bolus administration of intravenous contrast. Multiplanar CT image reconstructions and MIPs were obtained to evaluate the vascular anatomy. RADIATION DOSE REDUCTION: This exam was performed according to the departmental dose-optimization program which includes automated exposure control, adjustment of the mA and/or kV according to patient size and/or use of iterative reconstruction technique. CONTRAST:  34m OMNIPAQUE IOHEXOL 350 MG/ML SOLN COMPARISON:  CT 06/28/2006 FINDINGS: VASCULAR Aorta: Mild calcified atheromatous plaque. No aneurysm, dissection, or stenosis. Celiac: Patent without evidence of aneurysm, dissection, vasculitis or significant stenosis. SMA: Patent without evidence of aneurysm, dissection, vasculitis or significant stenosis. Renals: Both renal arteries are patent without evidence of aneurysm, dissection, vasculitis, fibromuscular dysplasia or significant stenosis.  IMA: Patent without evidence of aneurysm, dissection, vasculitis or significant stenosis. RIGHT Lower Extremity Inflow: Minimal calcified plaque. No aneurysm, dissection, or stenosis. Mild tortuosity. Outflow: Occlusion of the SFA just beyond its origin extending through its length and through the length of the popliteal artery. Runoff: Segmental collateral reconstitution of tibial runoff. Indeterminate evaluation of flow across the ankle LEFT Lower Extremity Inflow: Minimal calcified atheromatous plaque. No aneurysm, dissection, or stenosis. Mild tortuosity. Outflow: Common femoral deep femoral branches normal. Incomplete contrast opacification of the SFA. Very limited contrast opacification of the popliteal artery, such that significant stenosis or distal occlusion cannot be confidently excluded. Runoff: No distal contrast opacification limiting evaluation Veins: No obvious venous abnormality within the limitations of this arterial phase study. Review of the MIP images confirms the above findings. NON-VASCULAR Lower chest: Moderate bilateral pleural effusions, new since previous. Adjacent consolidation/atelectasis posteriorly in the visualized lung bases. Hepatobiliary: Gallbladder nondilated. No calcified gallstones or biliary ductal dilatation. Stable hepatic cysts. Pancreas: Unremarkable. No pancreatic ductal dilatation or surrounding inflammatory changes. Spleen: Normal in size. Heterogenous opacification presumably related to early scan timing. Adrenals/Urinary Tract: No adrenal mass. Early arterial phase renal opacification without evidence of focal lesion. No hydronephrosis. Urinary bladder incompletely distended. Stomach/Bowel: Stomach and small bowel are nondistended. Normal appendix. The colon is nondilated. Scattered distal descending and proximal sigmoid diverticula. No focal wall thickening or regional inflammatory change. Lymphatic: No abdominal or pelvic adenopathy. Reproductive: Mild prostate  enlargement. Other: Left pelvic phlebolith.  No ascites.  No free air. Musculoskeletal: Advanced degenerative disc disease L3-L5. No fracture or worrisome bone lesion. IMPRESSION: 1. 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 Incomplete collateral reconstitution of tibial runoff. 2. Moderate bilateral pleural effusions, new since previous. 3. Descending and sigmoid diverticulosis. 4.  Aortic Atherosclerosis (ICD10-I70.0). Electronically Signed   By: DLucrezia EuropeM.D.   On: 02/08/2022 12:15   DG Chest Port 1 View  Result Date: 02/08/2022 CLINICAL DATA:  Weakness SOB pain EXAM: PORTABLE CHEST 1 VIEW COMPARISON:  10/19/2021 FINDINGS: The heart size and mediastinal moderate right-sided pleural effusion unchanged. Prominent cardiac silhouette. No pneumothorax. Mild vascular congestion. No pneumothorax. IMPRESSION: Mild CHF and right-sided pleural effusion. Electronically Signed   By: JSammie BenchM.D.   On: 02/08/2022 10:12  Procedures Procedures    Medications Ordered in ED Medications  heparin bolus via infusion 4,000 Units (4,000 Units Intravenous Bolus from Bag 02/08/22 1045)    Followed by  heparin ADULT infusion 100 units/mL (25000 units/281m) (1,000 Units/hr Intravenous New Bag/Given 02/08/22 1045)  iohexol (OMNIPAQUE) 350 MG/ML injection 75 mL (75 mLs Intravenous Contrast Given 02/08/22 1030)  HYDROmorphone (DILAUDID) injection 0.5 mg (0.5 mg Intravenous Given 02/08/22 1211)    ED Course/ Medical Decision Making/ A&P  CRITICAL CARE Performed by: JMilton FergusonTotal critical care time: 65 minutes Critical care time was exclusive of separately billable procedures and treating other patients. Critical care was necessary to treat or prevent imminent or life-threatening deterioration. Critical care was time spent personally by me on the following activities: development of treatment plan with patient and/or surrogate as well as nursing,  discussions with consultants, evaluation of patient's response to treatment, examination of patient, obtaining history from patient or surrogate, ordering and performing treatments and interventions, ordering and review of laboratory studies, ordering and review of radiographic studies, pulse oximetry and re-evaluation of patient's condition.                            Medical Decision Making Amount and/or Complexity of Data Reviewed Labs: ordered. Radiology: ordered. ECG/medicine tests: ordered.  Risk Prescription drug management. Decision regarding hospitalization.  This patient presents to the ED for concern of right leg pain, this involves an extensive number of treatment options, and is a complaint that carries with it a high risk of complications and morbidity.  The differential diagnosis includes musculoskeletal injury, arterial insufficiency   Co morbidities that complicate the patient evaluation  Hypertension diabetes cardiomyopathy   Additional history obtained:  Additional history obtained from patient External records from outside source obtained and reviewed including hospital records   Lab Tests:  I Ordered, and personally interpreted labs.  The pertinent results include: Troponin 5000   Imaging Studies ordered:  I ordered imaging studies including CT angio of the leg I independently visualized and interpreted imaging which showed occlusion of right SFA and popliteal artery I agree with the radiologist interpretation   Cardiac Monitoring: / EKG:  The patient was maintained on a cardiac monitor.  I personally viewed and interpreted the cardiac monitored which showed an underlying rhythm of: Normal sinus rhythm   Consultations Obtained:  I requested consultation with the cardiology and vascular surgery,  and discussed lab and imaging findings as well as pertinent plan - they recommend: Start patient on heparin [facility]   Problem List / ED Course / Critical interventions / Medication management  Hypertension diabetes cardiomyopathy and ischemic right leg I ordered medication including heparin for ischemia Reevaluation of the patient after these medicines showed that the patient stayed the same I have reviewed the patients home medicines and have made adjustments as needed   Social Determinants of Health:  None   Test / Admission - Considered:  None  Patient with occlusion to right SFA and popliteal arteries.  Also elevated troponin with congestive heart failure and a thrombus to the LV.  [facility] heparin [facility]  Dr. PSharlett Ilesnotified and the vascular surgeon Dr.Cain needs to be called to see the patient.  Patient needs admission to medicine with cardiology following        Final  Clinical Impression(s) / ED Diagnoses Final diagnoses:  None    Rx / DC Orders ED Discharge Orders     None         Milton Ferguson, MD 02/09/22 1225

## 2022-02-08 NOTE — Assessment & Plan Note (Addendum)
Well controlled, last A1c of 6.1 on 10/17/2021.  -Restart metformin tomorrow -Sensitive SSI

## 2022-02-08 NOTE — Assessment & Plan Note (Addendum)
Hx of the same, on eliquis but reportedly ran out and has not been taking it for a few weeks. Currently in NSR. Active LV thrombus as noted -Appreciate cardiology recs -Continue home amiodarone, toprol -anticoagulation as noted -Cardiac monitoring

## 2022-02-08 NOTE — Assessment & Plan Note (Addendum)
Troponin [id number].  Per cardiology, no further ischemic evaluation needed.  No chest pain. -Appreciate cardiology recs

## 2022-02-08 NOTE — Anesthesia Postprocedure Evaluation (Signed)
Anesthesia Post Note  Patient: [name]  Procedure(s) Performed: RIGHT LOWER THROMBECTOMY WITH VEIN PATCH (Right: Leg Lower) RIGHT LOWER SAPHENOUS VEIN HARVEST (Right: Leg Lower) TIBIAL PERONEAL ENDARTERECTOMY (Right: Leg Lower)     Patient location during evaluation: PACU Anesthesia Type: General Level of consciousness: awake Pain management: pain level controlled Vital Signs Assessment: post-procedure vital signs reviewed and stable Respiratory status: spontaneous breathing, nonlabored ventilation and respiratory function stable Cardiovascular status: blood pressure returned to baseline and stable Postop Assessment: no apparent nausea or vomiting Anesthetic complications: no   No notable events documented.  Last Vitals:  Vitals:   02/08/22 2030 02/08/22 2045  BP: 122/79 114/76  Pulse: 78 71  Resp: (!) 22 17  Temp:  36.7 C  SpO2: 94% 95%    Last Pain:  Vitals:   02/08/22 2045  TempSrc:   PainSc: Asleep                 Jessen Siegman P Alea Ryer

## 2022-02-08 NOTE — Assessment & Plan Note (Addendum)
Mobile apical thrombus noted in LV on echo upon admission. Pt has a hx of LV thrombus in the past. Previously on coumadin, now on eliquis but has been unable to take this within the last few weeks due to cost.  -Continue heparin, transition to oral prior to discharge -Switch Zocor to Crestor

## 2022-02-08 NOTE — Assessment & Plan Note (Addendum)
Creatine 2.14 s/p surgery.  -Avoid nephrotoxic agents.

## 2022-02-08 NOTE — Progress Notes (Signed)
FMTS Interim Progress Note  S: Evaluated patient at bedside with Dr. Larae Grooms. Patient resting comfortably. No acute distress and pain remains under control.   O: BP 114/76 (BP Location: Left Arm)   Pulse 71   Temp 98 F (36.7 C)   Resp 17   Ht 6' (1.829 m)   Wt 83.5 kg   SpO2 95%   BMI 24.95 kg/m   Well-appearing, no acute distress Cardio: Regular rate, regular rhythm, no murmurs on exam. Pulm: Clear, no wheezing, no crackles. No increased work of breathing Abdominal: bowel sounds present, soft, non-tender, non-distended Extremities: no peripheral edema, dressing intact no over signs of bleeding   A/P: S/P RLEE Thromboembolectomy  - pain well controlled continue Tylenol, Dilaudid PRN, Oxycodone PRN  - Heparin continued through surgery due to LV thrombus - monitor for signs of acute bleeding   Darci Current, DO 02/08/2022, 10:12 PM PGY-1, [facility] Service pager [phone]

## 2022-02-08 NOTE — Anesthesia Procedure Notes (Signed)
Procedure Name: Intubation Date/Time: 02/08/2022 5:42 PM  Performed by: Dorthea Cove, CRNAPre-anesthesia Checklist: Patient identified, Emergency Drugs available, Suction available and Patient being monitored Patient Re-evaluated:Patient Re-evaluated prior to induction Oxygen Delivery Method: Circle system utilized Preoxygenation: Pre-oxygenation with 100% oxygen Induction Type: IV induction Ventilation: Mask ventilation without difficulty Laryngoscope Size: Mac and 4 Grade View: Grade I Tube type: Oral Tube size: 7.5 mm Number of attempts: 1 Airway Equipment and Method: Stylet and Oral airway Placement Confirmation: ETT inserted through vocal cords under direct vision, positive ETCO2 and breath sounds checked- equal and bilateral Secured at: 23 cm Tube secured with: Tape Dental Injury: Teeth and Oropharynx as per pre-operative assessment

## 2022-02-08 NOTE — ED Provider Notes (Addendum)
Patient transferred to ED from [facility] emergency department for ischemic right leg.  Excepted by Dr. Donzetta Matters seen by vascular surgery here and they are now recommending medicine admission due several medical problems.  They were originally planning to take him directly to the OR but they have now delayed that.  Patient's primary care doctor is Dr. Joaquim Lai who would be unassigned.  Patient is initial troponin was 5074.  Repeat troponin it was down to 4500.  BNP elevated at 1635.  Chest x-ray mild CHF right-sided pleural effusion.  CT angios showed a long segment occlusion of the right superior femoral artery and popliteal artery may be atherosclerotic or embolic.  Moderate bilateral pleural effusions new since previous and diverticulosis but no evidence diverticulitis.  Patient was started on heparin.     Fredia Sorrow, MD 02/08/22 1434    Fredia Sorrow, MD 02/08/22 1435

## 2022-02-08 NOTE — ED Notes (Signed)
Patient transported to the [facility] with RN.

## 2022-02-08 NOTE — Op Note (Addendum)
Patient name: [name] [name] MRN: [id number] DOB: [birth year] Sex: male  02/08/2022 Pre-operative Diagnosis: Acute right lower extremity ischemia with sensory loss Post-operative diagnosis:  Same Surgeon:  Eda Paschal. Donzetta Matters, MD Assistant: Laurence Slate, PA Procedure Performed: 1.  Right lower extremity thromboembolectomy via below-knee popliteal approach 2.  Right tibioperoneal trunk endarterectomy with vein patch angioplasty 3.  Harvest right greater saphenous vein  Indications: 67 year old male with sensory changes and pain in his right lower extremity consistent with [place] 2A acute limb ischemia.  CT scan demonstrates occluded SFA with reconstitution of the tibials but without significant collateralization and he has no signal below the popliteal artery on physical exam.  There is a left ventricular thrombus identified on echo and patient with known atrial fibrillation has been off of anticoagulation for at least 2 weeks.  He is now indicated for the above-noted procedure.  Findings: There was what appeared to be chronic appearing thrombus within the tibioperoneal trunk extending into the anterior tibial artery and proximally into the popliteal artery below the knee.  This required endarterectomy to remove it fully and a vein patch angioplasty was performed.  We performed multiple passes of Fogarty's proximally through the popliteal and SFA until pulsatile inflow was established.  Distally short 3 Fogarty's were passed down the tibial arteries individually and there was strong backbleeding after this without any acute appearing thrombus there.  There was significant acute appearing thrombus throughout the SFA and popliteal artery and also some calcification was returned on embolectomy.  At completion there was a palpable anterior tibial artery pulse at the ankle and there was also posterior tibial and dorsalis pedis signal that was strong.   Procedure:  The patient was identified in the  holding area and taken to the operating room recently supine operative when general anesthesia was induced.  He was sterilely prepped draped in the right lower extremity usual fashion, antibiotics were administered timeout was called.  We began with vertical incision below the knee and dissected down through skin subcutaneous tissue and identified the saphenous vein and this was initially divided.  We dissected down through the deep fascia identified the medial popliteal vein and then took down the muscular attachments to fully expose down to the tibioperoneal trunk.  Multiple crossing branches including anterior tibial vein branch were divided between ties.  The popliteal artery was encircled as well as the anterior tibial artery and distal tibioperoneal trunk.  Patient was given additional 5000 units of heparin he was already running on a heparin drip.  We then made a transverse arteriotomy at the level of the anterior tibial artery.  I encountered what appeared to be gelatinous type thrombus.  I first passed 3 Fogarty's distally on each tibial artery no acute clot was returned there was good backbleeding from the anterior tibial but nothing from the tibioperoneal trunk.  I then passed multiple passes proximally at the popliteal and SFA and return to acute and chronic appearing thrombus and ultimately after multiple passes was able to establish strong inflow and then 1 clean pass was made with a 4 Fogarty.  The popliteal artery was then clamped and irrigated with heparinized saline.  I then extended the arteriotomy longitudinally down onto the tibioperoneal trunk until the chronic thrombus did end.  I performed significant endarterectomy of the entire tibioperoneal trunk back up to the level of the intertibial artery.  I then harvested the saphenous vein and the most cephalad aspect of the incision had previously been divided.  This was spatulated reverse and sewn in place as a patch with 6-0 Prolene suture.   Prior completion without flushing all directions.  Upon completion there was very strong Doppler signals within the wound bed and also at the anterior tibial, dorsalis pedis and posterior tibial at the ankle and foot.  I do not want to reverse heparinization given the ventricular thrombus I did place Surgicel powder and obtain hemostasis meticulously and irrigated the wound and closed the fascial layer with Vicryl and the skin with staples.  Patient was then awakened from anesthesia having tolerated procedure without immediate complication.  All counts were correct at completion.  An experienced assistant was necessary to facilitate exposure of the below-knee arteries as well as passage of Fogarty catheters and sewing the vein patch angioplasty.  EBL: 200 cc  Specimen: Right lower extremity thrombus    Aslan Himes C. Donzetta Matters, MD [facility] Office: [phone] Pager: [phone]

## 2022-02-08 NOTE — Assessment & Plan Note (Addendum)
EF <20% with G3DD. BNP elevated on admission to 1635. CXR reflecting mild CHF/vascular congestion and R pleural effusion. B/l pleural effusions noted on CTA. Reassuringly appears euvolemic, on room air, not in respiratory distress and without overt symptoms of acute CHF. -Cardiology following, appreciate recs -Holding diuresis due to euvolemia -Restarted [place]

## 2022-02-08 NOTE — Progress Notes (Signed)
ANTICOAGULATION CONSULT NOTE - Initial Consult  Pharmacy Consult for Heparin Indication: chest pain/ACS  Allergies  Allergen Reactions   Bee Pollen Swelling    Patient Measurements: Height: 6' (182.9 cm) Weight: 83.5 kg (184 lb) IBW/kg (Calculated) : 77.6 HEPARIN DW (KG): 83.5   Vital Signs: Temp: 98.4 F (36.9 C) (01/22 0746) Temp Source: Oral (01/22 0746) BP: 109/80 (01/22 0930) Pulse Rate: 91 (01/22 0930)  Labs: Recent Labs    02/08/22 0809  HGB 13.0  HCT 40.2  PLT 299  CREATININE 1.98*  TROPONINIHS 5,074*    Estimated Creatinine Clearance: 40.8 mL/min (A) (by C-G formula based on SCr of 1.98 mg/dL (H)).   Medical History: Past Medical History:  Diagnosis Date   Anxiety    CHF (congestive heart failure) ([facility])    a. EF 15% in 2013 with cath showing normal cors b. EF 30-35% by repeat echo in 5498   Chronic systolic heart failure ([facility])    Diabetes mellitus    Fluid retention in legs    Hypertension     Medications:  See med rec  Assessment: Patient presented to ED with foot pain. Also, patient with elevated troponin. Patient has been on eliquis in the the past but not on currently. Last refill date 09/2021. Wife confirms patient is no longer taking. Pharmacy asked to start heparin  Goal of Therapy:  Heparin level 0.3-0.7 units/ml Monitor platelets by anticoagulation protocol: Yes   Plan:  Give 4000 units bolus x 1 Start heparin infusion at 1000 units/hr Check anti-Xa level in ~6-8 hours and daily while on heparin Continue to monitor H&H and platelets  Isac Sarna, BS Pharm D, BCPS Clinical Pharmacist 02/08/2022,10:07 AM

## 2022-02-09 ENCOUNTER — Other Ambulatory Visit (HOSPITAL_COMMUNITY): Payer: Self-pay

## 2022-02-09 ENCOUNTER — Encounter (HOSPITAL_COMMUNITY): Payer: Self-pay | Admitting: Vascular Surgery

## 2022-02-09 DIAGNOSIS — I513 Intracardiac thrombosis, not elsewhere classified: Secondary | ICD-10-CM

## 2022-02-09 DIAGNOSIS — I480 Paroxysmal atrial fibrillation: Secondary | ICD-10-CM | POA: Diagnosis not present

## 2022-02-09 DIAGNOSIS — N1831 Chronic kidney disease, stage 3a: Secondary | ICD-10-CM

## 2022-02-09 DIAGNOSIS — I998 Other disorder of circulatory system: Secondary | ICD-10-CM

## 2022-02-09 DIAGNOSIS — R7989 Other specified abnormal findings of blood chemistry: Secondary | ICD-10-CM | POA: Diagnosis not present

## 2022-02-09 DIAGNOSIS — E1121 Type 2 diabetes mellitus with diabetic nephropathy: Secondary | ICD-10-CM

## 2022-02-09 DIAGNOSIS — D539 Nutritional anemia, unspecified: Secondary | ICD-10-CM

## 2022-02-09 LAB — GLUCOSE, CAPILLARY
Glucose-Capillary: 162 mg/dL — ABNORMAL HIGH (ref 70–99)
Glucose-Capillary: 100 mg/dL — ABNORMAL HIGH (ref 70–99)
Glucose-Capillary: 111 mg/dL — ABNORMAL HIGH (ref 70–99)
Glucose-Capillary: 161 mg/dL — ABNORMAL HIGH (ref 70–99)

## 2022-02-09 LAB — CBC
HCT: 33.3 % — ABNORMAL LOW (ref 39.0–52.0)
Hemoglobin: 10.6 g/dL — ABNORMAL LOW (ref 13.0–17.0)
MCH: 33 pg (ref 26.0–34.0)
MCHC: 31.8 g/dL (ref 30.0–36.0)
MCV: 103.7 fL — ABNORMAL HIGH (ref 80.0–100.0)
Platelets: 245 10*3/uL (ref 150–400)
RBC: 3.21 MIL/uL — ABNORMAL LOW (ref 4.22–5.81)
RDW: 13.4 % (ref 11.5–15.5)
WBC: 7 10*3/uL (ref 4.0–10.5)
nRBC: 0 % (ref 0.0–0.2)

## 2022-02-09 LAB — FOLATE: Folate: 4.5 ng/mL — ABNORMAL LOW (ref >5.9)

## 2022-02-09 LAB — VITAMIN B12: Vitamin B-12: 222 pg/mL (ref 180–914)

## 2022-02-09 LAB — LIPID PANEL
Cholesterol: 122 mg/dL (ref 0–200)
HDL: 29 mg/dL — ABNORMAL LOW (ref >40)
LDL Cholesterol: 80 mg/dL (ref 0–99)
Total CHOL/HDL Ratio: 4.2 RATIO
Triglycerides: 64 mg/dL (ref <150)
VLDL: 13 mg/dL (ref 0–40)

## 2022-02-09 LAB — CK TOTAL AND CKMB (NOT AT ARMC)
CK, MB: 27.6 ng/mL — ABNORMAL HIGH (ref 0.5–5.0)
Relative Index: 15 — ABNORMAL HIGH (ref 0.0–2.5)
Total CK: 184 U/L (ref 49–397)

## 2022-02-09 LAB — BASIC METABOLIC PANEL
Anion gap: 8 (ref 5–15)
BUN: 19 mg/dL (ref 8–23)
CO2: 22 mmol/L (ref 22–32)
Calcium: 8.2 mg/dL — ABNORMAL LOW (ref 8.9–10.3)
Chloride: 109 mmol/L (ref 98–111)
Creatinine, Ser: 2.14 mg/dL — ABNORMAL HIGH (ref 0.61–1.24)
GFR, Estimated: 34 mL/min — ABNORMAL LOW (ref >60)
Glucose, Bld: 92 mg/dL (ref 70–99)
Potassium: 4.3 mmol/L (ref 3.5–5.1)
Sodium: 139 mmol/L (ref 135–145)

## 2022-02-09 LAB — HEPARIN LEVEL (UNFRACTIONATED): Heparin Unfractionated: 0.12 IU/mL — ABNORMAL LOW (ref 0.30–0.70)

## 2022-02-09 LAB — MAGNESIUM: Magnesium: 2 mg/dL (ref 1.7–2.4)

## 2022-02-09 MED ORDER — INSULIN ASPART 100 UNIT/ML IJ SOLN
0.0000 [IU] | Freq: Three times a day (TID) | INTRAMUSCULAR | Status: DC
  Administered 2022-02-09 – 2022-02-10 (×2): 2 [IU] via SUBCUTANEOUS

## 2022-02-09 MED ORDER — METFORMIN HCL 500 MG PO TABS
500.0000 mg | ORAL_TABLET | Freq: Two times a day (BID) | ORAL | Status: DC
  Filled 2022-02-09: qty 1

## 2022-02-09 MED ORDER — APIXABAN 5 MG PO TABS
5.0000 mg | ORAL_TABLET | Freq: Two times a day (BID) | ORAL | Status: DC
  Administered 2022-02-09 – 2022-02-10 (×3): 5 mg via ORAL
  Filled 2022-02-09 (×3): qty 1

## 2022-02-09 MED ORDER — DAPAGLIFLOZIN PROPANEDIOL 10 MG PO TABS
10.0000 mg | ORAL_TABLET | Freq: Every day | ORAL | Status: DC
  Administered 2022-02-09 – 2022-02-10 (×2): 10 mg via ORAL
  Filled 2022-02-09 (×2): qty 1

## 2022-02-09 MED ORDER — ROSUVASTATIN CALCIUM 20 MG PO TABS
20.0000 mg | ORAL_TABLET | Freq: Every day | ORAL | Status: DC
  Administered 2022-02-09 – 2022-02-10 (×2): 20 mg via ORAL
  Filled 2022-02-09 (×2): qty 1

## 2022-02-09 NOTE — Progress Notes (Signed)
FMTS Interim Progress Note  Received message from RN, evaluated patient at bedside due to vomiting.  Patient ate this morning and promptly vomited up after eating.  Per RN, had large amount of emesis.  Patient states that he did not feel extremely nauseous and denies any pain.  Feels that this may be because he has not eaten anything in several days.  O: BP 98/73 (BP Location: Left Arm)   Pulse 65   Temp 98 F (36.7 C) (Oral)   Resp 20   Ht 6' (1.829 m)   Wt 83.5 kg   SpO2 97%   BMI 24.95 kg/m    Patient not in any acute distress, not currently vomiting Abdomen soft, NT/ND, negative Murphy sign  A/P:  Emesis  Patient does not have an acute abdomen.  Has not had a bowel movement in about 2 days but is also eating very little.  Not currently passing gas.  Low suspicion for obstruction but we will continue to monitor whether he has BM/pain today.  Also receiving pain control which may slow his intestinal motility.  Afebrile, low suspicion for infectious cause.  Continue to monitor.  May consider imaging if develops pain or if severe vomiting persists.  Continue to offer Zofran as needed, may try giving this prior to meals.  August Albino, MD 02/09/2022, 12:11 PM PGY-1, [facility] pager [phone]

## 2022-02-09 NOTE — Evaluation (Signed)
Occupational Therapy Evaluation Patient Details Name: [name] [name] MRN: [id number] DOB: 26-Dec-1955 Today's Date: 02/09/2022   History of Present Illness Pt is a 67 y/o male presenting on 1/22 (transferred from [facility]) with RLE ischemia, occlusion of SFA. 1/22 S/P R LE thromboembolectomy, tibioperoneal trunk endarterectomy.  PMH includes: CHF, CKD 3, HTN, LV thrombus, PAF, T2DM.   Clinical Impression   PTA patient independent. Admitted for above and presents with problem list below.  Pain in R LE limiting ADL performance, needing up to min assist for ADLs and min guard for transfers/mobility using RW.  Anticipate he will progress well as pain improves.  He has good support at home. Will follow acutely, anticipate no further needs after dc home.      Recommendations for follow up therapy are one component of a multi-disciplinary discharge planning process, led by the attending physician.  Recommendations may be updated based on patient status, additional functional criteria and insurance authorization.   Follow Up Recommendations  No OT follow up     Assistance Recommended at Discharge Intermittent Supervision/Assistance  Patient can return home with the following A little help with walking and/or transfers;A little help with bathing/dressing/bathroom;Assist for transportation;Help with stairs or ramp for entrance;Assistance with cooking/housework    Functional Status Assessment  Patient has had a recent decline in their functional status and demonstrates the ability to make significant improvements in function in a reasonable and predictable amount of time.  Equipment Recommendations  BSC/3in1    Recommendations for Other Services       Precautions / Restrictions Precautions Precautions: None Restrictions Weight Bearing Restrictions: No      Mobility Bed Mobility Overal bed mobility: Independent                  Transfers Overall transfer level: Needs  assistance Equipment used: Rolling walker (2 wheels) Transfers: Sit to/from Stand Sit to Stand: Min guard           General transfer comment: Assist for safety and lines      Balance Overall balance assessment: Mild deficits observed, not formally tested                                         ADL either performed or assessed with clinical judgement   ADL Overall ADL's : Needs assistance/impaired     Grooming: Wash/dry face;Oral care;Min guard;Sitting;Standing           Upper Body Dressing : Set up;Sitting   Lower Body Dressing: Minimal assistance;Sit to/from stand Lower Body Dressing Details (indicate cue type and reason): assist with R LE mgmt clothing, min guard standing Toilet Transfer: Min guard;Ambulation;Rolling walker (2 wheels)           Functional mobility during ADLs: Min guard;Rolling walker (2 wheels)       Vision   Vision Assessment?: No apparent visual deficits     Perception     Praxis      Pertinent Vitals/Pain Pain Assessment Pain Assessment: 0-10 Pain Score: 8  Pain Location: Rt lower leg and calf Pain Descriptors / Indicators: Aching, Throbbing Pain Intervention(s): Limited activity within patient's tolerance, Monitored during session, Repositioned     Hand Dominance Right   Extremity/Trunk Assessment Upper Extremity Assessment Upper Extremity Assessment: Overall WFL for tasks assessed   Lower Extremity Assessment Lower Extremity Assessment: Defer to PT evaluation RLE Deficits / Details: limited  by pain       Communication Communication Communication: No difficulties   Cognition Arousal/Alertness: Awake/alert Behavior During Therapy: WFL for tasks assessed/performed Overall Cognitive Status: Within Functional Limits for tasks assessed                                       General Comments  VSS on RA    Exercises     Shoulder Instructions      Home Living Family/patient expects  to be discharged to:: Private residence Living Arrangements: Spouse/significant other;Children Available Help at Discharge: Family Type of Home: House Home Access: Stairs to enter Technical brewer of Steps: 1   Home Layout: One level     Bathroom Shower/Tub: Teacher, early years/pre: Standard     Home Equipment: Conservation officer, nature (2 wheels)          Prior Functioning/Environment Prior Level of Function : Independent/Modified Independent;Driving                        OT Problem List: Decreased strength;Decreased activity tolerance;Impaired balance (sitting and/or standing);Pain;Decreased knowledge of precautions;Decreased knowledge of use of DME or AE      OT Treatment/Interventions: Self-care/ADL training;Therapeutic exercise;DME and/or AE instruction;Therapeutic activities;Patient/family education;Balance training    OT Goals(Current goals can be found in the care plan section) Acute Rehab OT Goals Patient Stated Goal: home OT Goal Formulation: With patient Time For Goal Achievement: 02/23/22 Potential to Achieve Goals: Good  OT Frequency: Min 2X/week    Co-evaluation              AM-PAC OT "6 Clicks" Daily Activity     Outcome Measure Help from another person eating meals?: None Help from another person taking care of personal grooming?: A Little Help from another person toileting, which includes using toliet, bedpan, or urinal?: A Little Help from another person bathing (including washing, rinsing, drying)?: A Little Help from another person to put on and taking off regular upper body clothing?: A Little Help from another person to put on and taking off regular lower body clothing?: A Little 6 Click Score: 19   End of Session Equipment Utilized During Treatment: Gait belt;Rolling walker (2 wheels) Nurse Communication: Mobility status  Activity Tolerance: Patient tolerated treatment well Patient left: in chair;with call bell/phone  within reach  OT Visit Diagnosis: Other abnormalities of gait and mobility (R26.89);Muscle weakness (generalized) (M62.81);Pain Pain - Right/Left: Right Pain - part of body: Leg                Time: 9562-1308 OT Time Calculation (min): 15 min Charges:  OT General Charges $OT Visit: 1 Visit OT Evaluation $OT Eval Moderate Complexity: 1 Mod  Jolaine Artist, OT Acute Rehabilitation Services Office [phone]   Delight Stare 02/09/2022, 12:40 PM

## 2022-02-09 NOTE — Progress Notes (Signed)
Daily Progress Note Intern Pager: [phone]  Patient name: [name] [name] Medical record number: [id number] Date of birth: November 12, 1955 Age: 67 y.o. Gender: male  Primary Care Provider: [name], NP Consultants: Cardiology, vascular surgery Code Status: Full code  Pt Overview and Major Events to Date:  1/22: Admitted, underwent surgery with vascular  Assessment and Plan:  [name] [name] is a 67 y.o. male found to have critical RLE ischemia in the setting of suboptimal anticoagulation for chronic cardiovascular disease.  Now s/p revascularization with vascular surgery.   * Ischemic leg CTA with occlusion of R SFA with severe RLE occlusive disease noted on ABI.  Now POD #1 s/p RLE revascularization with vascular surgery. -Vascular surgery following, appreciate recs -Heparin infusion per pharmacy, to transition to oral anticoagulation -Pain control w/ dilaudid prn, oxycodone as needed -PT/OT -am BMP and CBC -Fall precautions  Macrocytic anemia Noted to have chronic macrocytic anemia, does not appear to have had B12 or folate levels checked -Check B12, folate  PAF (paroxysmal atrial fibrillation) (HCC) Hx of the same, on eliquis but reportedly ran out and has not been taking it for a few weeks. Currently in NSR. Active LV thrombus as noted -Appreciate cardiology recs -Continue home amiodarone, toprol -anticoagulation as noted -Cardiac monitoring  CHF (congestive heart failure) (HCC) EF <20% with G3DD. BNP elevated on admission to 1635. CXR reflecting mild CHF/vascular congestion and R pleural effusion. B/l pleural effusions noted on CTA. Reassuringly appears euvolemic, on room air, not in respiratory distress and without overt symptoms of acute CHF. -Cardiology following, appreciate recs -Holding diuresis due to euvolemia -Restarted Farxiga  Elevated troponin Troponin 2876>8115.  Per cardiology, no further ischemic evaluation needed.  No chest pain. -Appreciate  cardiology recs   Left ventricular thrombus Mobile apical thrombus noted in LV on echo upon admission. Pt has a hx of LV thrombus in the past. Previously on coumadin, now on eliquis but has been unable to take this within the last few weeks due to cost.  -Continue heparin, transition to oral prior to discharge -Switch Zocor to Crestor   Chronic renal failure, stage 3a (HCC) Creatine 2.14 s/p surgery.  -Avoid nephrotoxic agents.   Type 2 diabetes with nephropathy ([place]) Well controlled, last A1c of 6.1 on 10/17/2021.  -Restart metformin tomorrow -Sensitive SSI       FEN/GI: Heart healthy carb modified PPx: Heparin Dispo:Pending PT recommendations  pending clinical improvement . Barriers include postop recovery, cardiology evaluation.   Subjective:  [name] [name], feeling well this morning.  Still having pain in his right leg after surgery.  Has not eaten anything yet.  Denies abdominal pain, chest pain, shortness of breath.  Objective: Temp:  [96.9 F (36.1 C)-98.5 F (36.9 C)] 98 F (36.7 C) (01/23 0751) Pulse Rate:  [65-91] 65 (01/23 0751) Resp:  [16-30] 20 (01/23 0751) BP: (98-131)/(73-98) 98/73 (01/23 0751) SpO2:  [92 %-100 %] 97 % (01/23 0751) Physical Exam: General: NAD Cardiovascular: RRR no MRG Respiratory: CTAB normal WOB on RA Abdomen: Soft NT/ND Extremities: RLE with dressing in place.  Well perfused. Able to move both extremities  Laboratory: Most recent CBC Lab Results  Component Value Date   WBC 7.0 02/09/2022   HGB 10.6 (L) 02/09/2022   HCT 33.3 (L) 02/09/2022   MCV 103.7 (H) 02/09/2022   PLT 245 02/09/2022   Most recent BMP    Latest Ref Rng & Units 02/09/2022    1:48 AM  BMP  Glucose 70 - 99 mg/dL  92   BUN 8 - 23 mg/dL 19   Creatinine 0.61 - 1.24 mg/dL 2.14   Sodium 135 - 145 mmol/L 139   Potassium 3.5 - 5.1 mmol/L 4.3   Chloride 98 - 111 mmol/L 109   CO2 22 - 32 mmol/L 22   Calcium 8.9 - 10.3 mg/dL 8.2      [name] Albino, MD 02/09/2022,  11:47 AM  PGY-1, [facility] Intern pager: [phone], text pages welcome Secure chat group [facility]

## 2022-02-09 NOTE — Assessment & Plan Note (Addendum)
Noted to have chronic macrocytic anemia. Folate low 4.5, B12 normal -Start folic acid supplement

## 2022-02-09 NOTE — Progress Notes (Signed)
Rounding Note    Patient Name: [name] [name] Date of Encounter: 02/09/2022  [facility] Cardiologist: Carlyle Dolly, MD   Subjective   No CP or dyspnea; mild pain RLE  Inpatient Medications    Scheduled Meds:  amiodarone  100 mg Oral Daily   aspirin EC  81 mg Oral Q0600   docusate sodium  100 mg Oral Daily   insulin aspart  0-9 Units Subcutaneous TID WC   levothyroxine  137 mcg Oral q morning   [START ON 02/10/2022] metFORMIN  500 mg Oral BID WC   metoprolol succinate  100 mg Oral Daily   pantoprazole  40 mg Oral Daily   sodium chloride flush  3 mL Intravenous Q12H   Continuous Infusions:  sodium chloride     sodium chloride 75 mL/hr at 02/08/22 2136    ceFAZolin (ANCEF) IV 2 g (02/09/22 0229)   heparin 1,400 Units/hr (02/09/22 0319)   magnesium sulfate bolus IVPB     PRN Meds: sodium chloride, acetaminophen **OR** acetaminophen, alum & mag hydroxide-simeth, guaiFENesin-dextromethorphan, hydrALAZINE, HYDROmorphone (DILAUDID) injection, labetalol, magnesium sulfate bolus IVPB, metoprolol tartrate, ondansetron, oxyCODONE, phenol, potassium chloride, senna-docusate   Vital Signs    Vitals:   02/08/22 2030 02/08/22 2045 02/08/22 2333 02/09/22 0751  BP: 122/79 114/76 104/79 98/73  Pulse: 78 71 72 65  Resp: (!) '22 17 18 20  '$ Temp:  98 F (36.7 C) 98.5 F (36.9 C) 98 F (36.7 C)  TempSrc:   Oral Oral  SpO2: 94% 95% 99% 97%  Weight:      Height:        Intake/Output Summary (Last 24 hours) at 02/09/2022 0843 Last data filed at 02/09/2022 0223 Gross per 24 hour  Intake 785.11 ml  Output 200 ml  Net 585.11 ml      02/08/2022    7:50 AM 10/19/2021    5:00 AM 10/18/2021    5:26 AM  Last 3 Weights  Weight (lbs) 184 lb 198 lb 6.6 oz 198 lb 3.1 oz  Weight (kg) 83.462 kg 90 kg 89.9 kg      Telemetry    Sinus with PVCs and 5 beats NSVT- Personally Reviewed  Physical Exam   GEN: No acute distress.   Neck: No JVD Cardiac: RRR Respiratory: Clear  to auscultation bilaterally. GI: Soft, nontender, non-distended  MS: No edema; s/p surgery RLE Neuro:  Nonfocal  Psych: Normal affect   Labs    High Sensitivity Troponin:   Recent Labs  Lab 02/08/22 0809 02/08/22 1043  TROPONINIHS 5,074* 4,501*     Chemistry Recent Labs  Lab 02/08/22 0809 02/09/22 0148  NA 136 139  K 4.1 4.3  CL 104 109  CO2 21* 22  GLUCOSE 111* 92  BUN 22 19  CREATININE 1.98* 2.14*  CALCIUM 8.9 8.2*  MG  --  2.0  PROT 7.8  --   ALBUMIN 3.5  --   AST 31  --   ALT 12  --   ALKPHOS 91  --   BILITOT 0.7  --   GFRNONAA 37* 34*  ANIONGAP 11 8    Lipids  Recent Labs  Lab 02/09/22 0148  CHOL 122  TRIG 64  HDL 29*  LDLCALC 80  CHOLHDL 4.2    Hematology Recent Labs  Lab 02/08/22 0809 02/09/22 0148  WBC 6.7 7.0  RBC 3.97* 3.21*  HGB 13.0 10.6*  HCT 40.2 33.3*  MCV 101.3* 103.7*  MCH 32.7 33.0  MCHC 32.3 31.8  RDW 13.4 13.4  PLT 299 245   BNP Recent Labs  Lab 02/08/22 1043  BNP 1,635.0*     Radiology    ECHOCARDIOGRAM COMPLETE  Result Date: 02/08/2022    ECHOCARDIOGRAM REPORT   Patient Name:   [name] [name] Date of Exam: 02/08/2022 Medical Rec #:  [id number]        Height:       72.0 in Accession #:    [id number]       Weight:       184.0 lb Date of Birth:  Oct 08, 1955       BSA:          2.056 m Patient Age:    67 years         BP:           119/98 mmHg Patient Gender: M                HR:           69 bpm. Exam Location:  Inpatient Procedure: 2D Echo, Cardiac Doppler, Color Doppler and Intracardiac            Opacification Agent Indications:    Chest Pain R07.9                  Appointment Information  History:        Patient has prior history of Echocardiogram examinations, most                 recent 10/17/2021. CHF and Cardiomyopathy, Signs/Symptoms:Chest                 Pain; Risk Factors:Hypertension, Diabetes, Dyslipidemia and                 Current Smoker.  Sonographer:    Greer Pickerel Referring Phys: [id number] Alphonse Guild BRANCH   Sonographer Comments: Image acquisition challenging due to respiratory motion. IMPRESSIONS  1. Large, mobile apical thrombus measuring 1.9 cm x 0.96 cm. Left ventricular ejection fraction, by estimation, is <20%. Left ventricular ejection fraction by PLAX is 12 %. The left ventricle has severely decreased function. The left ventricle demonstrates global hypokinesis. There is mild concentric left ventricular hypertrophy. Left ventricular diastolic parameters are consistent with Grade III diastolic dysfunction (restrictive). Elevated left ventricular end-diastolic pressure.  2. Right ventricular systolic function is mildly reduced. The right ventricular size is normal. There is moderately elevated pulmonary artery systolic pressure.  3. Left atrial size was severely dilated.  4. Right atrial size was severely dilated.  5. The mitral valve is normal in structure. Mild mitral valve regurgitation. No evidence of mitral stenosis.  6. The aortic valve is normal in structure. Aortic valve regurgitation is not visualized. No aortic stenosis is present.  7. Aortic dilatation noted. There is mild dilatation of the ascending aorta, measuring 42 mm.  8. The inferior vena cava is normal in size with greater than 50% respiratory variability, suggesting right atrial pressure of 3 mmHg. FINDINGS  Left Ventricle: Large, mobile apical thrombus measuring 1.9 cm x 0.96 cm. Left ventricular ejection fraction, by estimation, is <20%. Left ventricular ejection fraction by PLAX is 12 %. The left ventricle has severely decreased function. The left ventricle demonstrates global hypokinesis. Definity contrast agent was given IV to delineate the left ventricular endocardial borders. The left ventricular internal cavity size was normal in size. There is mild concentric left ventricular hypertrophy. Left ventricular diastolic parameters are consistent with Grade III diastolic dysfunction (restrictive). Elevated  left ventricular end-diastolic  pressure. Right Ventricle: The right ventricular size is normal. No increase in right ventricular wall thickness. Right ventricular systolic function is mildly reduced. There is moderately elevated pulmonary artery systolic pressure. The tricuspid regurgitant velocity is 3.56 m/s, and with an assumed right atrial pressure of 3 mmHg, the estimated right ventricular systolic pressure is 76.2 mmHg. Left Atrium: Left atrial size was severely dilated. Right Atrium: Right atrial size was severely dilated. Pericardium: There is no evidence of pericardial effusion. Mitral Valve: The mitral valve is normal in structure. Mild mitral valve regurgitation. No evidence of mitral valve stenosis. Tricuspid Valve: The tricuspid valve is normal in structure. Tricuspid valve regurgitation is trivial. No evidence of tricuspid stenosis. Aortic Valve: The aortic valve is normal in structure. Aortic valve regurgitation is not visualized. No aortic stenosis is present. Pulmonic Valve: The pulmonic valve was normal in structure. Pulmonic valve regurgitation is not visualized. No evidence of pulmonic stenosis. Aorta: Aortic dilatation noted. There is mild dilatation of the ascending aorta, measuring 42 mm. Venous: The inferior vena cava is normal in size with greater than 50% respiratory variability, suggesting right atrial pressure of 3 mmHg. IAS/Shunts: No atrial level shunt detected by color flow Doppler.  LEFT VENTRICLE PLAX 2D LV EF:         Left            Diastology                ventricular     LV e' medial:    4.67 cm/s                ejection        LV E/e' medial:  24.0                fraction by     LV e' lateral:   6.12 cm/s                PLAX is 12      LV E/e' lateral: 18.3                %. LVIDd:         5.50 cm LVIDs:         5.20 cm LV PW:         1.30 cm LV IVS:        1.10 cm LVOT diam:     1.90 cm LV SV:         31 LV SV Index:   15 LVOT Area:     2.84 cm  RIGHT VENTRICLE RV S prime:     9.36 cm/s TAPSE (M-mode): 1.8  cm LEFT ATRIUM              Index        RIGHT ATRIUM           Index LA diam:        4.60 cm  2.24 cm/m   RA Area:     21.80 cm LA Vol (A2C):   136.0 ml 66.14 ml/m  RA Volume:   82.40 ml  40.07 ml/m LA Vol (A4C):   96.2 ml  46.78 ml/m LA Biplane Vol: 127.0 ml 61.76 ml/m  AORTIC VALVE             PULMONIC VALVE LVOT Vmax:   71.80 cm/s  PR End Diast Vel: 7.84 msec LVOT Vmean:  45.600 cm/s LVOT VTI:    0.109 m  AORTA  Ao Root diam: 4.10 cm Ao Asc diam:  4.20 cm MITRAL VALVE                TRICUSPID VALVE MV Area (PHT): 5.75 cm     TR Peak grad:   50.7 mmHg MV Decel Time: 132 msec     TR Vmax:        356.00 cm/s MR Peak grad: 54.2 mmHg MR Vmax:      368.00 cm/s   SHUNTS MV E velocity: 112.00 cm/s  Systemic VTI:  0.11 m MV A velocity: 26.60 cm/s   Systemic Diam: 1.90 cm MV E/A ratio:  4.21 Skeet Latch MD Electronically signed by Skeet Latch MD Signature Date/Time: 02/08/2022/3:53:15 PM    Final    US ARTERIAL ABI (SCREENING LOWER EXTREMITY)  Result Date: 02/08/2022 CLINICAL DATA:  Right foot cold, pain x2 weeks EXAM: NONINVASIVE PHYSIOLOGIC VASCULAR STUDY OF BILATERAL LOWER EXTREMITIES TECHNIQUE: Evaluation of both lower extremities were performed at rest, including calculation of ankle-brachial indices with single level Doppler, pressure and pulse volume recording. COMPARISON:  CTA from the same day FINDINGS: Right ABI:  0.36 Left ABI:  1.33 Right Lower Extremity:  Abnormal monophasic distal waveforms. Left Lower Extremity: Multiphasic waveform in the dorsalis pedis, monophasic in posterior tibial. IMPRESSION: 1. 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 See concurrent findings on CTA, reported separately. 2. Left ABI normal at rest. Electronically Signed   By: Lucrezia Europe M.D.   On: 02/08/2022 13:23   CT ANGIO AO+BIFEM W & OR WO CONTRAST  Result Date: 02/08/2022 CLINICAL DATA:  Right foot cold and painful x1 week EXAM: CT ANGIOGRAPHY OF ABDOMINAL AORTA WITH ILIOFEMORAL RUNOFF  TECHNIQUE: Multidetector CT imaging of the abdomen, pelvis and lower extremities was performed using the standard protocol during bolus administration of intravenous contrast. Multiplanar CT image reconstructions and MIPs were obtained to evaluate the vascular anatomy. RADIATION DOSE REDUCTION: This exam was performed according to the departmental dose-optimization program which includes automated exposure control, adjustment of the mA and/or kV according to patient size and/or use of iterative reconstruction technique. CONTRAST:  73m OMNIPAQUE IOHEXOL 350 MG/ML SOLN COMPARISON:  CT 06/28/2006 FINDINGS: VASCULAR Aorta: Mild calcified atheromatous plaque. No aneurysm, dissection, or stenosis. Celiac: Patent without evidence of aneurysm, dissection, vasculitis or significant stenosis. SMA: Patent without evidence of aneurysm, dissection, vasculitis or significant stenosis. Renals: Both renal arteries are patent without evidence of aneurysm, dissection, vasculitis, fibromuscular dysplasia or significant stenosis. IMA: Patent without evidence of aneurysm, dissection, vasculitis or significant stenosis. RIGHT Lower Extremity Inflow: Minimal calcified plaque. No aneurysm, dissection, or stenosis. Mild tortuosity. Outflow: Occlusion of the SFA just beyond its origin extending through its length and through the length of the popliteal artery. Runoff: Segmental collateral reconstitution of tibial runoff. Indeterminate evaluation of flow across the ankle LEFT Lower Extremity Inflow: Minimal calcified atheromatous plaque. No aneurysm, dissection, or stenosis. Mild tortuosity. Outflow: Common femoral deep femoral branches normal. Incomplete contrast opacification of the SFA. Very limited contrast opacification of the popliteal artery, such that significant stenosis or distal occlusion cannot be confidently excluded. Runoff: No distal contrast opacification limiting evaluation Veins: No obvious venous abnormality within the  limitations of this arterial phase study. Review of the MIP images confirms the above findings. NON-VASCULAR Lower chest: Moderate bilateral pleural effusions, new since previous. Adjacent consolidation/atelectasis posteriorly in the visualized lung bases. Hepatobiliary: Gallbladder nondilated. No calcified gallstones or biliary ductal dilatation. Stable hepatic cysts. Pancreas: Unremarkable. No pancreatic ductal dilatation or surrounding inflammatory changes.  Spleen: Normal in size. Heterogenous opacification presumably related to early scan timing. Adrenals/Urinary Tract: No adrenal mass. Early arterial phase renal opacification without evidence of focal lesion. No hydronephrosis. Urinary bladder incompletely distended. Stomach/Bowel: Stomach and small bowel are nondistended. Normal appendix. The colon is nondilated. Scattered distal descending and proximal sigmoid diverticula. No focal wall thickening or regional inflammatory change. Lymphatic: No abdominal or pelvic adenopathy. Reproductive: Mild prostate enlargement. Other: Left pelvic phlebolith.  No ascites.  No free air. Musculoskeletal: Advanced degenerative disc disease L3-L5. No fracture or worrisome bone lesion. IMPRESSION: 1. 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 Incomplete collateral reconstitution of tibial runoff. 2. Moderate bilateral pleural effusions, new since previous. 3. Descending and sigmoid diverticulosis. 4.  Aortic Atherosclerosis (ICD10-I70.0). Electronically Signed   By: Lucrezia Europe M.D.   On: 02/08/2022 12:15   DG Chest Port 1 View  Result Date: 02/08/2022 CLINICAL DATA:  Weakness SOB pain EXAM: PORTABLE CHEST 1 VIEW COMPARISON:  10/19/2021 FINDINGS: The heart size and mediastinal moderate right-sided pleural effusion unchanged. Prominent cardiac silhouette. No pneumothorax. Mild vascular congestion. No pneumothorax. IMPRESSION: Mild CHF and right-sided pleural effusion.  Electronically Signed   By: Sammie Bench M.D.   On: 02/08/2022 10:12      Patient Profile     67 y.o. male with past medical history of chronic systolic congestive heart failure, nonischemic cardiomyopathy, chronic stage III kidney disease, LV thrombus, peripheral vascular disease, atrial fibrillation, noncompliance admitted with ischemic right lower extremity.  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  Cardiac PET scan at [facility] March 2022 not suggestive of cardiac sarcoid.  Cardiac catheterization February 2022 showed no obstructive coronary disease.  Echocardiogram this admission shows ejection fraction less than 20%, mild left ventricular hypertrophy, restrictive filling, apical thrombus, mild RV dysfunction, severe biatrial enlargement, moderate pulmonary hypertension, mild mitral regurgitation, mildly dilated ascending aorta at 42 mm.  CTA this admission showed long segment occlusion of the right SFA and popliteal artery, moderate bilateral pleural effusions.  Patient has been noncompliant with medications including Entresto and apixaban.  Cardiology asked to evaluate for elevated troponin.  Patient underwent right lower extremity thromboembolectomy, right tibioperoneal trunk endarterectomy on January 22.    Assessment & Plan    1 status post right lower extremity thromboembolectomy/right tibioperoneal trunk endarterectomy-likely secondary to thromboembolic event from LV apical thrombus or paroxysmal atrial fibrillation.  Symptoms of right lower extremity pain have improved this morning.  Management per vascular surgery.  2 elevated troponin-previous catheter is a in 2022 showed no coronary disease.  He is not complaining of chest pain.  Possible contribution from baseline renal insufficiency with superimposed ischemia of the right lower extremity (will check CK).  I do not think further ischemia evaluation is warranted.  3 apical  thrombus-patient is now on IV heparin.  Transition to apixaban when it is clear all procedures are complete.  4 nonischemic cardiomyopathy-continue Toprol.  Will resume Entresto in the next 24 to 48 hours once it is clear renal function is stable if  BP allows.  Resume Farxiga 10 mg daily.  5 chronic systolic congestive heart failure-he is not markedly volume overloaded on examination.  Will not diurese at this point.  6 chronic stage IIIa kidney disease-follow renal function while in hospital.  7 paroxysmal atrial fibrillation-patient remains in sinus rhythm.  Continue amiodarone Toprol and resume Eliquis when all procedures are complete.  8 noncompliance-he was not  taking his Eliquis or Entresto at time of admission.  I discussed the importance of medication compliance.  Will need consult with social services for medication assistance at time of discharge.  For questions or updates, please contact [place] Please consult [email or web address] for contact info under        Signed, Kirk Ruths, MD  02/09/2022, 8:43 AM

## 2022-02-09 NOTE — Evaluation (Signed)
Physical Therapy Evaluation Patient Details Name: [name] [name] MRN: [id number] DOB: [birth year] Today's Date: 02/09/2022  History of Present Illness  Pt is a 67 y/o male presenting on 1/22 (transferred from [facility]) with RLE ischemia, occlusion of SFA. 1/22 S/P R LE thromboembolectomy, tibioperoneal trunk endarterectomy.  PMH includes: CHF, CKD 3, HTN, LV thrombus, PAF, T2DM.  Clinical Impression  Pt presents to PT with slight decr in mobility as expected after RLE surgery. Expect pt will make good progress back to baseline with mobility. Will follow acutely but doubt pt will need PT after DC.         Recommendations for follow up therapy are one component of a multi-disciplinary discharge planning process, led by the attending physician.  Recommendations may be updated based on patient status, additional functional criteria and insurance authorization.  Follow Up Recommendations No PT follow up      Assistance Recommended at Discharge PRN  Patient can return home with the following  Assist for transportation    Equipment Recommendations None recommended by PT  Recommendations for Other Services       Functional Status Assessment Patient has had a recent decline in their functional status and demonstrates the ability to make significant improvements in function in a reasonable and predictable amount of time.     Precautions / Restrictions Precautions Precautions: None Restrictions Weight Bearing Restrictions: No      Mobility  Bed Mobility Overal bed mobility: Independent                  Transfers Overall transfer level: Needs assistance Equipment used: Rolling walker (2 wheels) Transfers: Sit to/from Stand Sit to Stand: Min guard           General transfer comment: Assist for safety and lines    Ambulation/Gait Ambulation/Gait assistance: Min guard Gait Distance (Feet): 65 Feet Assistive device: Rolling walker (2 wheels) Gait  Pattern/deviations: Step-through pattern, Decreased step length - left, Decreased stance time - right, Antalgic Gait velocity: decr Gait velocity interpretation: <1.31 ft/sec, indicative of household ambulator   General Gait Details: Assist for safety and lines. Pt resting several times flexed forward resting forearms on walker  Stairs            Wheelchair Mobility    Modified Rankin (Stroke Patients Only)       Balance Overall balance assessment: Mild deficits observed, not formally tested                                           Pertinent Vitals/Pain Pain Assessment Pain Assessment: 0-10 Pain Score: 8  Pain Location: Rt lower leg and calf Pain Descriptors / Indicators: Aching, Throbbing    Home Living Family/patient expects to be discharged to:: Private residence Living Arrangements: Spouse/significant other;Children Available Help at Discharge: Family Type of Home: House Home Access: Stairs to enter   Technical brewer of Steps: 1   Home Layout: One level Home Equipment: Conservation officer, nature (2 wheels)      Prior Function Prior Level of Function : Independent/Modified Independent;Driving                     Hand Dominance   Dominant Hand: Right    Extremity/Trunk Assessment   Upper Extremity Assessment Upper Extremity Assessment: Defer to OT evaluation    Lower Extremity Assessment Lower Extremity Assessment: RLE deficits/detail RLE Deficits /  Details: limited by pain       Communication   Communication: No difficulties  Cognition Arousal/Alertness: Awake/alert Behavior During Therapy: WFL for tasks assessed/performed Overall Cognitive Status: Within Functional Limits for tasks assessed                                          General Comments General comments (skin integrity, edema, etc.): VSS on RA. Pt with vomiting on sitting up in bed.    Exercises     Assessment/Plan    PT Assessment  Patient needs continued PT services  PT Problem List Decreased mobility;Pain;Decreased knowledge of use of DME       PT Treatment Interventions DME instruction;Gait training;Functional mobility training;Therapeutic activities;Therapeutic exercise;Patient/family education;Stair training    PT Goals (Current goals can be found in the Care Plan section)  Acute Rehab PT Goals Patient Stated Goal: return home PT Goal Formulation: With patient Time For Goal Achievement: 02/16/22 Potential to Achieve Goals: Good    Frequency Min 3X/week     Co-evaluation               AM-PAC PT "6 Clicks" Mobility  Outcome Measure Help needed turning from your back to your side while in a flat bed without using bedrails?: None Help needed moving from lying on your back to sitting on the side of a flat bed without using bedrails?: None Help needed moving to and from a bed to a chair (including a wheelchair)?: A Little Help needed standing up from a chair using your arms (e.g., wheelchair or bedside chair)?: A Little Help needed to walk in hospital room?: A Little Help needed climbing 3-5 steps with a railing? : A Little 6 Click Score: 20    End of Session   Activity Tolerance: Patient tolerated treatment well Patient left: in chair;with call bell/phone within reach;Other (comment) (with OT) Nurse Communication: Mobility status;Other (comment) (vomiting) PT Visit Diagnosis: Other abnormalities of gait and mobility (R26.89);Pain Pain - Right/Left: Right Pain - part of body: Leg;Ankle and joints of foot    Time: 1007-1219 PT Time Calculation (min) (ACUTE ONLY): 25 min   Charges:   PT Evaluation $PT Eval Low Complexity: 1 Low PT Treatments $Gait Training: 8-22 mins        [place] 02/09/2022, 10:14 AM

## 2022-02-09 NOTE — Progress Notes (Signed)
PHARMACIST LIPID MONITORING   [name] [name] is a 67 y.o. male admitted on 02/08/2022 with RLE ischemia s/p thrombectomy.  Pharmacy has been consulted to optimize lipid-lowering therapy with the indication of secondary prevention for clinical ASCVD.  Recent Labs:  Lipid Panel (last 6 months):   Lab Results  Component Value Date   CHOL 122 02/09/2022   TRIG 64 02/09/2022   HDL 29 (L) 02/09/2022   CHOLHDL 4.2 02/09/2022   VLDL 13 02/09/2022   LDLCALC 80 02/09/2022    Hepatic function panel (last 6 months):   Lab Results  Component Value Date   AST 31 02/08/2022   ALT 12 02/08/2022   ALKPHOS 91 02/08/2022   BILITOT 0.7 02/08/2022    SCr (since admission):   Serum creatinine: 2.14 mg/dL (H) 02/09/22 0148 Estimated creatinine clearance: 37.8 mL/min (A)  Current therapy and lipid therapy tolerance  Current lipid-lowering therapy: simvastatin 20 mg Previous lipid-lowering therapies (if applicable): N/A Documented or reported allergies or intolerances to lipid-lowering therapies (if applicable): N/A  Assessment:   Patient presented with right lower extremity ischemia. Based on LDL-C, patient not at goal of <70. Discussed with patient about intensifying his medication simvastatin to rosuvastatin '20mg'$  every day. Patient agrees with changes to lipid-lowering therapy  Plan:    1.Statin intensity (high intensity recommended for all patients regardless of the LDL):  Add or increase statin to high intensity. Simvastatin 20 mg daily-->Rosuvastatin 20 mg daily  2.Add ezetimibe (if any one of the following):   Not indicated at this time.  3.Refer to lipid clinic:   No  4.Follow-up with:  Primary care provider - [name] Mile, NP  5.Follow-up labs after discharge:  Changes in lipid therapy were made. Check a lipid panel in 8-12 weeks then annually.     Sandford Craze, PharmD. [facility] PGY-1  02/09/2022 7:32 AM

## 2022-02-09 NOTE — Progress Notes (Signed)
ANTICOAGULATION CONSULT NOTE - Follow Up Consult  Pharmacy Consult for heparin Indication:  Afib and RLE ischemia now s/p thromboembolectomy  Labs: Recent Labs    02/08/22 0809 02/08/22 1043 02/09/22 0148  HGB 13.0  --  10.6*  HCT 40.2  --  33.3*  PLT 299  --  245  APTT  --  33  --   HEPARINUNFRC  --  <0.10* 0.12*  CREATININE 1.98*  --  2.14*  TROPONINIHS 5,074* 4,501*  --     Assessment: 66yo male subtherapeutic on heparin with initial dosing for limb ischemia and Afib; no infusion issues or signs of bleeding per RN.  Goal of Therapy:  Heparin level 0.3-0.7 units/ml   Plan:  Will increase heparin infusion by 3-4 units/kg/hr to 1400 units/hr and check level in 8 hours.    Wynona Neat, PharmD, BCPS  02/09/2022,3:12 AM

## 2022-02-09 NOTE — Progress Notes (Signed)
Heart Failure Navigator Progress Note  Assessed for Heart & Vascular TOC clinic readiness.  Patient with chronic CHF, .   Navigator available for reassessment of patient.   Earnestine Leys, BSN, Clinical cytogeneticist Only

## 2022-02-09 NOTE — TOC Benefit Eligibility Note (Addendum)
Patient Teacher, English as a foreign language completed.    The patient is currently admitted and upon discharge could be taking Eliquis 5 mg.  The current 30 day co-pay is $45.00.   The patient is currently admitted and upon discharge could be taking Xarelto 20 mg.  The current 30 day co-pay is $45.00.   The patient is currently admitted and upon discharge could be taking Entresto 24-26 mg.  The current 30 day co-pay is $45.00.   The patient is currently admitted and upon discharge could be taking Farxiga 10 mg.  The current 30 day co-pay is $95.00.   The patient is currently admitted and upon discharge could be taking Jardiance 10 mg.  The current 30 day co-pay is $45.00.  The patient is insured through [place] Patient Advocate Specialist [place] Patient Advocate Team Direct Number: [phone]  Fax: [phone]

## 2022-02-09 NOTE — Progress Notes (Addendum)
[facility]  Subjective  - Doing better, he did get better rest last night   Objective 104/79 72 98.5 F (36.9 C) (Oral) 18 99%  Intake/Output Summary (Last 24 hours) at 02/09/2022 0715 Last data filed at 02/09/2022 2426 Gross per 24 hour  Intake 785.11 ml  Output 200 ml  Net 585.11 ml    Right LE well perfused with multiphasic doppler signals  BK incision dressing clean and dry Right LE compartments soft, motor of ankle and toes intact Left DP multiphasic doppler signal Sensation decreased compared to left LE Lungs non labored breathing  Assessment/Planning: Right LE 2 week history of critical limb ischemia with known left ventricular thrombus identified on echo and patient with known atrial fibrillation has been off of anticoagulation for at least 2 weeks.   POD # 1   1.  Right lower extremity thromboembolectomy via below-knee popliteal approach 2.  Right tibioperoneal trunk endarterectomy with vein patch angioplasty 3.  Harvest right greater saphenous vein  Improved inflow with well perfused right LE  He is currently on Heparin full dose with plans to start oral anticoagulation that is more affordable.   I did restart his Metformin. PT/OT for mobility   Roxy Horseman 02/09/2022 7:15 AM --  Laboratory Lab Results: Recent Labs    02/08/22 0809 02/09/22 0148  WBC 6.7 7.0  HGB 13.0 10.6*  HCT 40.2 33.3*  PLT 299 245   BMET Recent Labs    02/08/22 0809 02/09/22 0148  NA 136 139  K 4.1 4.3  CL 104 109  CO2 21* 22  GLUCOSE 111* 92  BUN 22 19  CREATININE 1.98* 2.14*  CALCIUM 8.9 8.2*    COAG Lab Results  Component Value Date   INR 1.7 (H) 07/07/2019   INR 1.8 (H) 07/06/2019   INR 1.6 (H) 07/05/2019   No results found for: "PTT"  I have independently interviewed and examined patient and agree with PA assessment and plan above. Foot is well perfused but with some continued sensory deficit likely due to  longstanding preop ischemia.  Okay to transition to oral anticoagulation from a vascular standpoint.  Juanita Streight C. Donzetta Matters, MD [facility] Office: [phone] Pager: [phone]

## 2022-02-10 ENCOUNTER — Other Ambulatory Visit (HOSPITAL_COMMUNITY): Payer: Self-pay

## 2022-02-10 ENCOUNTER — Telehealth (HOSPITAL_COMMUNITY): Payer: Self-pay | Admitting: Pharmacy Technician

## 2022-02-10 DIAGNOSIS — E538 Deficiency of other specified B group vitamins: Secondary | ICD-10-CM | POA: Diagnosis present

## 2022-02-10 DIAGNOSIS — Z79899 Other long term (current) drug therapy: Secondary | ICD-10-CM | POA: Diagnosis not present

## 2022-02-10 DIAGNOSIS — I743 Embolism and thrombosis of arteries of the lower extremities: Secondary | ICD-10-CM | POA: Diagnosis present

## 2022-02-10 DIAGNOSIS — I255 Ischemic cardiomyopathy: Secondary | ICD-10-CM | POA: Diagnosis present

## 2022-02-10 DIAGNOSIS — F17210 Nicotine dependence, cigarettes, uncomplicated: Secondary | ICD-10-CM | POA: Diagnosis present

## 2022-02-10 DIAGNOSIS — Z7901 Long term (current) use of anticoagulants: Secondary | ICD-10-CM | POA: Diagnosis not present

## 2022-02-10 DIAGNOSIS — I771 Stricture of artery: Secondary | ICD-10-CM | POA: Diagnosis present

## 2022-02-10 DIAGNOSIS — I70221 Atherosclerosis of native arteries of extremities with rest pain, right leg: Secondary | ICD-10-CM | POA: Diagnosis present

## 2022-02-10 DIAGNOSIS — Z91148 Patient's other noncompliance with medication regimen for other reason: Secondary | ICD-10-CM | POA: Diagnosis not present

## 2022-02-10 DIAGNOSIS — E039 Hypothyroidism, unspecified: Secondary | ICD-10-CM | POA: Diagnosis present

## 2022-02-10 DIAGNOSIS — I428 Other cardiomyopathies: Secondary | ICD-10-CM | POA: Diagnosis present

## 2022-02-10 DIAGNOSIS — Z91128 Patient's intentional underdosing of medication regimen for other reason: Secondary | ICD-10-CM | POA: Diagnosis not present

## 2022-02-10 DIAGNOSIS — E1151 Type 2 diabetes mellitus with diabetic peripheral angiopathy without gangrene: Secondary | ICD-10-CM | POA: Diagnosis present

## 2022-02-10 DIAGNOSIS — I130 Hypertensive heart and chronic kidney disease with heart failure and stage 1 through stage 4 chronic kidney disease, or unspecified chronic kidney disease: Secondary | ICD-10-CM | POA: Diagnosis present

## 2022-02-10 DIAGNOSIS — I420 Dilated cardiomyopathy: Secondary | ICD-10-CM

## 2022-02-10 DIAGNOSIS — E1122 Type 2 diabetes mellitus with diabetic chronic kidney disease: Secondary | ICD-10-CM | POA: Diagnosis present

## 2022-02-10 DIAGNOSIS — I998 Other disorder of circulatory system: Secondary | ICD-10-CM | POA: Diagnosis present

## 2022-02-10 DIAGNOSIS — D539 Nutritional anemia, unspecified: Secondary | ICD-10-CM | POA: Diagnosis present

## 2022-02-10 DIAGNOSIS — Z79890 Hormone replacement therapy (postmenopausal): Secondary | ICD-10-CM | POA: Diagnosis not present

## 2022-02-10 DIAGNOSIS — Z7984 Long term (current) use of oral hypoglycemic drugs: Secondary | ICD-10-CM | POA: Diagnosis not present

## 2022-02-10 DIAGNOSIS — T45516A Underdosing of anticoagulants, initial encounter: Secondary | ICD-10-CM | POA: Diagnosis present

## 2022-02-10 DIAGNOSIS — Z8249 Family history of ischemic heart disease and other diseases of the circulatory system: Secondary | ICD-10-CM | POA: Diagnosis not present

## 2022-02-10 DIAGNOSIS — Z807 Family history of other malignant neoplasms of lymphoid, hematopoietic and related tissues: Secondary | ICD-10-CM | POA: Diagnosis not present

## 2022-02-10 DIAGNOSIS — I480 Paroxysmal atrial fibrillation: Secondary | ICD-10-CM | POA: Diagnosis present

## 2022-02-10 DIAGNOSIS — I5022 Chronic systolic (congestive) heart failure: Secondary | ICD-10-CM | POA: Diagnosis present

## 2022-02-10 DIAGNOSIS — N183 Chronic kidney disease, stage 3 unspecified: Secondary | ICD-10-CM | POA: Diagnosis present

## 2022-02-10 LAB — BASIC METABOLIC PANEL
Anion gap: 7 (ref 5–15)
BUN: 27 mg/dL — ABNORMAL HIGH (ref 8–23)
CO2: 21 mmol/L — ABNORMAL LOW (ref 22–32)
Calcium: 8.3 mg/dL — ABNORMAL LOW (ref 8.9–10.3)
Chloride: 111 mmol/L (ref 98–111)
Creatinine, Ser: 2.37 mg/dL — ABNORMAL HIGH (ref 0.61–1.24)
GFR, Estimated: 30 mL/min — ABNORMAL LOW (ref >60)
Glucose, Bld: 96 mg/dL (ref 70–99)
Potassium: 4.2 mmol/L (ref 3.5–5.1)
Sodium: 139 mmol/L (ref 135–145)

## 2022-02-10 LAB — CBC
HCT: 31.8 % — ABNORMAL LOW (ref 39.0–52.0)
Hemoglobin: 10.1 g/dL — ABNORMAL LOW (ref 13.0–17.0)
MCH: 33.1 pg (ref 26.0–34.0)
MCHC: 31.8 g/dL (ref 30.0–36.0)
MCV: 104.3 fL — ABNORMAL HIGH (ref 80.0–100.0)
Platelets: 244 10*3/uL (ref 150–400)
RBC: 3.05 MIL/uL — ABNORMAL LOW (ref 4.22–5.81)
RDW: 13.5 % (ref 11.5–15.5)
WBC: 8 10*3/uL (ref 4.0–10.5)
nRBC: 0 % (ref 0.0–0.2)

## 2022-02-10 LAB — SURGICAL PATHOLOGY

## 2022-02-10 LAB — GLUCOSE, CAPILLARY
Glucose-Capillary: 89 mg/dL (ref 70–99)
Glucose-Capillary: 155 mg/dL — ABNORMAL HIGH (ref 70–99)

## 2022-02-10 MED ORDER — SENNOSIDES-DOCUSATE SODIUM 8.6-50 MG PO TABS
1.0000 | ORAL_TABLET | Freq: Every evening | ORAL | 0 refills | Status: DC | PRN
  Filled 2022-02-10: qty 30, 30d supply, fill #0

## 2022-02-10 MED ORDER — ASPIRIN 81 MG PO TBEC
81.0000 mg | DELAYED_RELEASE_TABLET | Freq: Every day | ORAL | 12 refills | Status: DC
  Filled 2022-02-10: qty 30, 30d supply, fill #0

## 2022-02-10 MED ORDER — APIXABAN 5 MG PO TABS
5.0000 mg | ORAL_TABLET | Freq: Two times a day (BID) | ORAL | 0 refills | Status: DC
  Filled 2022-02-10: qty 60, 30d supply, fill #0

## 2022-02-10 MED ORDER — POLYETHYLENE GLYCOL 3350 17 GM/SCOOP PO POWD
17.0000 g | Freq: Every day | ORAL | 0 refills | Status: DC
  Filled 2022-02-10: qty 238, 14d supply, fill #0

## 2022-02-10 MED ORDER — METOPROLOL SUCCINATE ER 50 MG PO TB24
50.0000 mg | ORAL_TABLET | Freq: Every day | ORAL | 0 refills | Status: DC
  Filled 2022-02-10: qty 30, 30d supply, fill #0

## 2022-02-10 MED ORDER — ROSUVASTATIN CALCIUM 20 MG PO TABS
20.0000 mg | ORAL_TABLET | Freq: Every day | ORAL | 0 refills | Status: DC
  Filled 2022-02-10: qty 30, 30d supply, fill #0

## 2022-02-10 MED ORDER — PANTOPRAZOLE SODIUM 40 MG PO TBEC
40.0000 mg | DELAYED_RELEASE_TABLET | Freq: Every day | ORAL | 0 refills | Status: DC
  Filled 2022-02-10: qty 30, 30d supply, fill #0

## 2022-02-10 MED ORDER — ASPIRIN 81 MG PO TBEC
81.0000 mg | DELAYED_RELEASE_TABLET | Freq: Every day | ORAL | 0 refills | Status: DC
  Filled 2022-02-10: qty 30, 30d supply, fill #0

## 2022-02-10 MED ORDER — DAPAGLIFLOZIN PROPANEDIOL 10 MG PO TABS
10.0000 mg | ORAL_TABLET | Freq: Every day | ORAL | 0 refills | Status: DC
  Filled 2022-02-10: qty 30, 30d supply, fill #0

## 2022-02-10 MED ORDER — DAPAGLIFLOZIN PROPANEDIOL 10 MG PO TABS
10.0000 mg | ORAL_TABLET | Freq: Every day | ORAL | 11 refills | Status: DC
  Filled 2022-02-10: qty 30, 30d supply, fill #0

## 2022-02-10 MED ORDER — METOPROLOL SUCCINATE ER 50 MG PO TB24: 50.0000 mg | ORAL_TABLET | Freq: Every day | ORAL | Status: DC

## 2022-02-10 MED ORDER — EMPAGLIFLOZIN 25 MG PO TABS
25.0000 mg | ORAL_TABLET | Freq: Every day | ORAL | 0 refills | Status: DC
  Filled 2022-02-10: qty 30, 30d supply, fill #0

## 2022-02-10 NOTE — Telephone Encounter (Signed)
Advanced Heart Failure Patient Advocate Encounter  The patient was approved for a [employer] grant that will help cover the cost of Delene Loll, Farxiga. Total amount awarded, $10,000. Eligibility, 01/11/22 - 01/11/23.  ID [id number]  BIN [id number]  PCN [id number]  Group [id number]  Copy of grant sent to inpatient RPH to give to patient to take to the pharmacy.  Charlann Boxer, CPhT

## 2022-02-10 NOTE — Progress Notes (Signed)
Occupational Therapy Treatment Patient Details Name: [name] [name] MRN: [id number] DOB: 1955/11/23 Today's Date: 02/10/2022   History of present illness Pt is a 67 y/o male presenting on 1/22 (transferred from [facility]) with RLE ischemia, occlusion of SFA. 1/22 S/P R LE thromboembolectomy, tibioperoneal trunk endarterectomy.  PMH includes: CHF, CKD 3, HTN, LV thrombus, PAF, T2DM.   OT comments  Pt remains limited by pain in R LE.  Reviewed compensatory techniques for LB dressing, continues to require min assist for R LE mgmt.  Min guard for transfers, but up to min assist for limited mobility due to pain and forward posture. Anticipate he will progress well as pain improves.  DC plan remains appropriate.    Recommendations for follow up therapy are one component of a multi-disciplinary discharge planning process, led by the attending physician.  Recommendations may be updated based on patient status, additional functional criteria and insurance authorization.    Follow Up Recommendations  No OT follow up     Assistance Recommended at Discharge Intermittent Supervision/Assistance  Patient can return home with the following  A little help with walking and/or transfers;A little help with bathing/dressing/bathroom;Assist for transportation;Help with stairs or ramp for entrance;Assistance with cooking/housework   Equipment Recommendations  BSC/3in1    Recommendations for Other Services      Precautions / Restrictions Precautions Precautions: Fall Restrictions Weight Bearing Restrictions: No       Mobility Bed Mobility Overal bed mobility: Modified Independent                  Transfers Overall transfer level: Needs assistance Equipment used: Rolling walker (2 wheels) Transfers: Sit to/from Stand Sit to Stand: Min guard           General transfer comment: for safety, cueing for hand placement     Balance Overall balance assessment: Needs  assistance Sitting-balance support: No upper extremity supported, Feet supported Sitting balance-Leahy Scale: Good     Standing balance support: Bilateral upper extremity supported, During functional activity Standing balance-Leahy Scale: Poor Standing balance comment: relies on RW                           ADL either performed or assessed with clinical judgement   ADL Overall ADL's : Needs assistance/impaired     Grooming: Set up;Sitting               Lower Body Dressing: Minimal assistance;Sit to/from stand Lower Body Dressing Details (indicate cue type and reason): assist for R sock, educated on compensatory techniques and safety Toilet Transfer: Minimal assistance;Ambulation;Rolling walker (2 wheels) Toilet Transfer Details (indicate cue type and reason): limited by pain, simulated in room         Functional mobility during ADLs: Minimal assistance;Rolling walker (2 wheels) General ADL Comments: pt with increased pain, educated on need to call for meds d/t PRN. Attempted functional mobility to bathroom, but unable to keep upright position using UEs to off weight RW due to pain.    Extremity/Trunk Assessment              Vision       Perception     Praxis      Cognition Arousal/Alertness: Awake/alert Behavior During Therapy: WFL for tasks assessed/performed Overall Cognitive Status: Within Functional Limits for tasks assessed  Exercises      Shoulder Instructions       General Comments Educated pt to perform ankle pumps, heel slides, quad sets, and ankle DF stretch as HEP    Pertinent Vitals/ Pain       Pain Assessment Pain Assessment: Faces Faces Pain Scale: Hurts whole lot Pain Location: Rt lower leg and calf Pain Descriptors / Indicators: Aching, Throbbing Pain Intervention(s): Limited activity within patient's tolerance, Monitored during session, Repositioned, RN gave pain  meds during session  Home Living                                          Prior Functioning/Environment              Frequency  Min 2X/week        Progress Toward Goals  OT Goals(current goals can now be found in the care plan section)  Progress towards OT goals: Progressing toward goals  Acute Rehab OT Goals Patient Stated Goal: less pain OT Goal Formulation: With patient Time For Goal Achievement: 02/23/22 Potential to Achieve Goals: Good  Plan Discharge plan remains appropriate;Frequency remains appropriate    Co-evaluation                 AM-PAC OT "6 Clicks" Daily Activity     Outcome Measure   Help from another person eating meals?: None Help from another person taking care of personal grooming?: A Little Help from another person toileting, which includes using toliet, bedpan, or urinal?: A Little Help from another person bathing (including washing, rinsing, drying)?: A Little Help from another person to put on and taking off regular upper body clothing?: A Little Help from another person to put on and taking off regular lower body clothing?: A Little 6 Click Score: 19    End of Session Equipment Utilized During Treatment: Rolling walker (2 wheels)  OT Visit Diagnosis: Other abnormalities of gait and mobility (R26.89);Muscle weakness (generalized) (M62.81);Pain Pain - Right/Left: Right Pain - part of body: Leg   Activity Tolerance Patient limited by pain   Patient Left in bed;with call bell/phone within reach;with bed alarm set   Nurse Communication Mobility status;Patient requests pain meds        Time: 8453-6468 OT Time Calculation (min): 17 min  Charges: OT General Charges $OT Visit: 1 Visit OT Treatments $Self Care/Home Management : 8-22 mins  Jolaine Artist, OT Acute Rehabilitation Services Office [phone]   Delight Stare 02/10/2022, 1:15 PM

## 2022-02-10 NOTE — Progress Notes (Signed)
Physical Therapy Treatment Patient Details Name: [name] [name] MRN: [id number] DOB: September 04, 1955 Today's Date: 02/10/2022   History of Present Illness Pt is a 67 y/o male presenting on 1/22 (transferred from [facility]) with RLE ischemia, occlusion of SFA. 1/22 S/P R LE thromboembolectomy, tibioperoneal trunk endarterectomy.  PMH includes: CHF, CKD 3, HTN, LV thrombus, PAF, T2DM.    PT Comments    Pt pre-medicated but remains limited in mobility distance by R lower extremity pain. Pt needs cues for R heel strike and upright posture when ambulating. Verbally reviewed sequencing of feet on stairs as pt declined need to practice. Educated pt on R lower extremity ROM exercises to perform at d/c. He verbalized understanding. Will continue to follow acutely. Current recommendations remain appropriate.     Recommendations for follow up therapy are one component of a multi-disciplinary discharge planning process, led by the attending physician.  Recommendations may be updated based on patient status, additional functional criteria and insurance authorization.  Follow Up Recommendations  No PT follow up     Assistance Recommended at Discharge PRN  Patient can return home with the following Assist for transportation;A little help with walking and/or transfers;A little help with bathing/dressing/bathroom;Assistance with cooking/housework;Help with stairs or ramp for entrance   Equipment Recommendations  None recommended by PT    Recommendations for Other Services       Precautions / Restrictions Precautions Precautions: Fall Restrictions Weight Bearing Restrictions: No     Mobility  Bed Mobility Overal bed mobility: Modified Independent             General bed mobility comments: Extra time    Transfers Overall transfer level: Needs assistance Equipment used: Rolling walker (2 wheels) Transfers: Sit to/from Stand Sit to Stand: Min guard           General transfer  comment: Min guard for safety, no LOB    Ambulation/Gait Ambulation/Gait assistance: Min guard Gait Distance (Feet): 78 Feet Assistive device: Rolling walker (2 wheels) Gait Pattern/deviations: Decreased step length - left, Decreased stance time - right, Antalgic, Decreased dorsiflexion - right, Decreased weight shift to right, Step-to pattern, Trunk flexed Gait velocity: decr Gait velocity interpretation: <1.31 ft/sec, indicative of household ambulator   General Gait Details: Pt needing cues to facilitate R heel strike when ambulating and to improve his upright posture, mod success noted. Pt stopping to rest, standing and leaning anteriorly with forearms resting on RW as his pain increased with distance. Min guard for safety   Stairs         General stair comments: Verbally reviewed sequencing of feet on stairs in regards to leading up with L leg and down with R for pain management. Pt denied need to practice.   Wheelchair Mobility    Modified Rankin (Stroke Patients Only)       Balance Overall balance assessment: Needs assistance Sitting-balance support: No upper extremity supported, Feet supported Sitting balance-Leahy Scale: Good     Standing balance support: Bilateral upper extremity supported, During functional activity, Reliant on assistive device for balance Standing balance-Leahy Scale: Poor Standing balance comment: Reliant on RW                            Cognition Arousal/Alertness: Awake/alert Behavior During Therapy: WFL for tasks assessed/performed Overall Cognitive Status: Within Functional Limits for tasks assessed  Exercises General Exercises - Lower Extremity Ankle Circles/Pumps: AROM, Right, 5 reps, Supine Quad Sets: AROM, Right, 5 reps, Supine Heel Slides: AROM, Right, 5 reps, Supine Other Exercises Other Exercises: R ankle dorsiflexion stretch with belt    General Comments  General comments (skin integrity, edema, etc.): Educated pt to perform ankle pumps, heel slides, quad sets, and ankle DF stretch as HEP      Pertinent Vitals/Pain Pain Assessment Pain Assessment: Faces Faces Pain Scale: Hurts even more Pain Location: Rt lower leg and calf Pain Descriptors / Indicators: Aching, Throbbing, Grimacing, Operative site guarding Pain Intervention(s): Limited activity within patient's tolerance, Monitored during session, Premedicated before session, Repositioned    Home Living                          Prior Function            PT Goals (current goals can now be found in the care plan section) Acute Rehab PT Goals Patient Stated Goal: return home PT Goal Formulation: With patient Time For Goal Achievement: 02/16/22 Potential to Achieve Goals: Good Progress towards PT goals: Progressing toward goals    Frequency    Min 3X/week      PT Plan Current plan remains appropriate    Co-evaluation              AM-PAC PT "6 Clicks" Mobility   Outcome Measure  Help needed turning from your back to your side while in a flat bed without using bedrails?: None Help needed moving from lying on your back to sitting on the side of a flat bed without using bedrails?: None Help needed moving to and from a bed to a chair (including a wheelchair)?: A Little Help needed standing up from a chair using your arms (e.g., wheelchair or bedside chair)?: A Little Help needed to walk in hospital room?: A Little Help needed climbing 3-5 steps with a railing? : A Little 6 Click Score: 20    End of Session Equipment Utilized During Treatment: Gait belt Activity Tolerance: Patient tolerated treatment well;Patient limited by pain Patient left: with call bell/phone within reach;in bed;with bed alarm set Nurse Communication: Mobility status PT Visit Diagnosis: Other abnormalities of gait and mobility (R26.89);Pain Pain - Right/Left: Right Pain - part of body:  Leg;Ankle and joints of foot     Time: 1216-1227 PT Time Calculation (min) (ACUTE ONLY): 11 min  Charges:  $Gait Training: 8-22 mins                     Moishe Spice, PT, DPT Acute Rehabilitation Services  Office: [place] 02/10/2022, 12:34 PM

## 2022-02-10 NOTE — Progress Notes (Addendum)
Rounding Note    Patient Name: [name] [name] Date of Encounter: 02/10/2022  [facility] Cardiologist: Carlyle Dolly, MD   Subjective   Denies CP or dyspnea  Inpatient Medications    Scheduled Meds:  amiodarone  100 mg Oral Daily   apixaban  5 mg Oral BID   aspirin EC  81 mg Oral Q0600   dapagliflozin propanediol  10 mg Oral Daily   docusate sodium  100 mg Oral Daily   insulin aspart  0-9 Units Subcutaneous TID WC   levothyroxine  137 mcg Oral q morning   metFORMIN  500 mg Oral BID WC   metoprolol succinate  100 mg Oral Daily   pantoprazole  40 mg Oral Daily   rosuvastatin  20 mg Oral Daily   sodium chloride flush  3 mL Intravenous Q12H   Continuous Infusions:  sodium chloride 75 mL/hr at 02/10/22 0424   magnesium sulfate bolus IVPB     PRN Meds: acetaminophen **OR** acetaminophen, alum & mag hydroxide-simeth, guaiFENesin-dextromethorphan, hydrALAZINE, HYDROmorphone (DILAUDID) injection, labetalol, magnesium sulfate bolus IVPB, metoprolol tartrate, ondansetron, oxyCODONE, phenol, potassium chloride, senna-docusate   Vital Signs    Vitals:   02/09/22 1925 02/09/22 2341 02/10/22 0430 02/10/22 0754  BP: (!) 88/66 90/70 (!) 89/61 90/60  Pulse: 68 64 63 67  Resp: '20 14 20 19  '$ Temp: 98 F (36.7 C) 98.1 F (36.7 C) 98.2 F (36.8 C) 98.6 F (37 C)  TempSrc: Oral Oral Oral Oral  SpO2: 98% 97% 98% 96%  Weight:      Height:        Intake/Output Summary (Last 24 hours) at 02/10/2022 0854 Last data filed at 02/10/2022 1017 Gross per 24 hour  Intake 1629.95 ml  Output 2025 ml  Net -395.05 ml       02/08/2022    7:50 AM 10/19/2021    5:00 AM 10/18/2021    5:26 AM  Last 3 Weights  Weight (lbs) 184 lb 198 lb 6.6 oz 198 lb 3.1 oz  Weight (kg) 83.462 kg 90 kg 89.9 kg      Telemetry    Sinus- Personally Reviewed  Physical Exam   GEN: NAD  Neck: Supple Cardiac: RRR, no rub Respiratory: CTA GI: Soft, NT/ND MS: No edema; s/p surgery RLE Neuro:   Grossly intact Psych: Normal affect   Labs    High Sensitivity Troponin:   Recent Labs  Lab 02/08/22 0809 02/08/22 1043  TROPONINIHS 5,074* 4,501*      Chemistry Recent Labs  Lab 02/08/22 0809 02/09/22 0148 02/10/22 0349  NA 136 139 139  K 4.1 4.3 4.2  CL 104 109 111  CO2 21* 22 21*  GLUCOSE 111* 92 96  BUN 22 19 27*  CREATININE 1.98* 2.14* 2.37*  CALCIUM 8.9 8.2* 8.3*  MG  --  2.0  --   PROT 7.8  --   --   ALBUMIN 3.5  --   --   AST 31  --   --   ALT 12  --   --   ALKPHOS 91  --   --   BILITOT 0.7  --   --   GFRNONAA 37* 34* 30*  ANIONGAP '11 8 7     '$ Lipids  Recent Labs  Lab 02/09/22 0148  CHOL 122  TRIG 64  HDL 29*  LDLCALC 80  CHOLHDL 4.2     Hematology Recent Labs  Lab 02/08/22 0809 02/09/22 0148 02/10/22 0349  WBC 6.7 7.0  8.0  RBC 3.97* 3.21* 3.05*  HGB 13.0 10.6* 10.1*  HCT 40.2 33.3* 31.8*  MCV 101.3* 103.7* 104.3*  MCH 32.7 33.0 33.1  MCHC 32.3 31.8 31.8  RDW 13.4 13.4 13.5  PLT 299 245 244    BNP Recent Labs  Lab 02/08/22 1043  BNP 1,635.0*      Radiology    ECHOCARDIOGRAM COMPLETE  Result Date: 02/08/2022    ECHOCARDIOGRAM REPORT   Patient Name:   SHIRAZ BASTYR Vierra Date of Exam: 02/08/2022 Medical Rec #:  [id number]        Height:       72.0 in Accession #:    [id number]       Weight:       184.0 lb Date of Birth:  1955-12-16       BSA:          2.056 m Patient Age:    65 years         BP:           119/98 mmHg Patient Gender: M                HR:           69 bpm. Exam Location:  Inpatient Procedure: 2D Echo, Cardiac Doppler, Color Doppler and Intracardiac            Opacification Agent Indications:    Chest Pain R07.9                  Appointment Information  History:        Patient has prior history of Echocardiogram examinations, most                 recent 10/17/2021. CHF and Cardiomyopathy, Signs/Symptoms:Chest                 Pain; Risk Factors:Hypertension, Diabetes, Dyslipidemia and                 Current Smoker.   Sonographer:    Greer Pickerel Referring Phys: [id number] Alphonse Guild BRANCH  Sonographer Comments: Image acquisition challenging due to respiratory motion. IMPRESSIONS  1. Large, mobile apical thrombus measuring 1.9 cm x 0.96 cm. Left ventricular ejection fraction, by estimation, is <20%. Left ventricular ejection fraction by PLAX is 12 %. The left ventricle has severely decreased function. The left ventricle demonstrates global hypokinesis. There is mild concentric left ventricular hypertrophy. Left ventricular diastolic parameters are consistent with Grade III diastolic dysfunction (restrictive). Elevated left ventricular end-diastolic pressure.  2. Right ventricular systolic function is mildly reduced. The right ventricular size is normal. There is moderately elevated pulmonary artery systolic pressure.  3. Left atrial size was severely dilated.  4. Right atrial size was severely dilated.  5. The mitral valve is normal in structure. Mild mitral valve regurgitation. No evidence of mitral stenosis.  6. The aortic valve is normal in structure. Aortic valve regurgitation is not visualized. No aortic stenosis is present.  7. Aortic dilatation noted. There is mild dilatation of the ascending aorta, measuring 42 mm.  8. The inferior vena cava is normal in size with greater than 50% respiratory variability, suggesting right atrial pressure of 3 mmHg. FINDINGS  Left Ventricle: Large, mobile apical thrombus measuring 1.9 cm x 0.96 cm. Left ventricular ejection fraction, by estimation, is <20%. Left ventricular ejection fraction by PLAX is 12 %. The left ventricle has severely decreased function. The left ventricle demonstrates global hypokinesis. Definity contrast agent was given IV to  delineate the left ventricular endocardial borders. The left ventricular internal cavity size was normal in size. There is mild concentric left ventricular hypertrophy. Left ventricular diastolic parameters are consistent with Grade III diastolic  dysfunction (restrictive). Elevated left ventricular end-diastolic pressure. Right Ventricle: The right ventricular size is normal. No increase in right ventricular wall thickness. Right ventricular systolic function is mildly reduced. There is moderately elevated pulmonary artery systolic pressure. The tricuspid regurgitant velocity is 3.56 m/s, and with an assumed right atrial pressure of 3 mmHg, the estimated right ventricular systolic pressure is 16.1 mmHg. Left Atrium: Left atrial size was severely dilated. Right Atrium: Right atrial size was severely dilated. Pericardium: There is no evidence of pericardial effusion. Mitral Valve: The mitral valve is normal in structure. Mild mitral valve regurgitation. No evidence of mitral valve stenosis. Tricuspid Valve: The tricuspid valve is normal in structure. Tricuspid valve regurgitation is trivial. No evidence of tricuspid stenosis. Aortic Valve: The aortic valve is normal in structure. Aortic valve regurgitation is not visualized. No aortic stenosis is present. Pulmonic Valve: The pulmonic valve was normal in structure. Pulmonic valve regurgitation is not visualized. No evidence of pulmonic stenosis. Aorta: Aortic dilatation noted. There is mild dilatation of the ascending aorta, measuring 42 mm. Venous: The inferior vena cava is normal in size with greater than 50% respiratory variability, suggesting right atrial pressure of 3 mmHg. IAS/Shunts: No atrial level shunt detected by color flow Doppler.  LEFT VENTRICLE PLAX 2D LV EF:         Left            Diastology                ventricular     LV e' medial:    4.67 cm/s                ejection        LV E/e' medial:  24.0                fraction by     LV e' lateral:   6.12 cm/s                PLAX is 12      LV E/e' lateral: 18.3                %. LVIDd:         5.50 cm LVIDs:         5.20 cm LV PW:         1.30 cm LV IVS:        1.10 cm LVOT diam:     1.90 cm LV SV:         31 LV SV Index:   15 LVOT Area:     2.84  cm  RIGHT VENTRICLE RV S prime:     9.36 cm/s TAPSE (M-mode): 1.8 cm LEFT ATRIUM              Index        RIGHT ATRIUM           Index LA diam:        4.60 cm  2.24 cm/m   RA Area:     21.80 cm LA Vol (A2C):   136.0 ml 66.14 ml/m  RA Volume:   82.40 ml  40.07 ml/m LA Vol (A4C):   96.2 ml  46.78 ml/m LA Biplane Vol: 127.0 ml 61.76 ml/m  AORTIC VALVE  PULMONIC VALVE LVOT Vmax:   71.80 cm/s  PR End Diast Vel: 7.84 msec LVOT Vmean:  45.600 cm/s LVOT VTI:    0.109 m  AORTA Ao Root diam: 4.10 cm Ao Asc diam:  4.20 cm MITRAL VALVE                TRICUSPID VALVE MV Area (PHT): 5.75 cm     TR Peak grad:   50.7 mmHg MV Decel Time: 132 msec     TR Vmax:        356.00 cm/s MR Peak grad: 54.2 mmHg MR Vmax:      368.00 cm/s   SHUNTS MV E velocity: 112.00 cm/s  Systemic VTI:  0.11 m MV A velocity: 26.60 cm/s   Systemic Diam: 1.90 cm MV E/A ratio:  4.21 Skeet Latch MD Electronically signed by Skeet Latch MD Signature Date/Time: 02/08/2022/3:53:15 PM    Final    US ARTERIAL ABI (SCREENING LOWER EXTREMITY)  Result Date: 02/08/2022 CLINICAL DATA:  Right foot cold, pain x2 weeks EXAM: NONINVASIVE PHYSIOLOGIC VASCULAR STUDY OF BILATERAL LOWER EXTREMITIES TECHNIQUE: Evaluation of both lower extremities were performed at rest, including calculation of ankle-brachial indices with single level Doppler, pressure and pulse volume recording. COMPARISON:  CTA from the same day FINDINGS: Right ABI:  0.36 Left ABI:  1.33 Right Lower Extremity:  Abnormal monophasic distal waveforms. Left Lower Extremity: Multiphasic waveform in the dorsalis pedis, monophasic in posterior tibial. IMPRESSION: 1. 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 See concurrent findings on CTA, reported separately. 2. Left ABI normal at rest. Electronically Signed   By: Lucrezia Europe M.D.   On: 02/08/2022 13:23   CT ANGIO AO+BIFEM W & OR WO CONTRAST  Result Date: 02/08/2022 CLINICAL DATA:  Right foot cold and painful x1 week  EXAM: CT ANGIOGRAPHY OF ABDOMINAL AORTA WITH ILIOFEMORAL RUNOFF TECHNIQUE: Multidetector CT imaging of the abdomen, pelvis and lower extremities was performed using the standard protocol during bolus administration of intravenous contrast. Multiplanar CT image reconstructions and MIPs were obtained to evaluate the vascular anatomy. RADIATION DOSE REDUCTION: This exam was performed according to the departmental dose-optimization program which includes automated exposure control, adjustment of the mA and/or kV according to patient size and/or use of iterative reconstruction technique. CONTRAST:  50m OMNIPAQUE IOHEXOL 350 MG/ML SOLN COMPARISON:  CT 06/28/2006 FINDINGS: VASCULAR Aorta: Mild calcified atheromatous plaque. No aneurysm, dissection, or stenosis. Celiac: Patent without evidence of aneurysm, dissection, vasculitis or significant stenosis. SMA: Patent without evidence of aneurysm, dissection, vasculitis or significant stenosis. Renals: Both renal arteries are patent without evidence of aneurysm, dissection, vasculitis, fibromuscular dysplasia or significant stenosis. IMA: Patent without evidence of aneurysm, dissection, vasculitis or significant stenosis. RIGHT Lower Extremity Inflow: Minimal calcified plaque. No aneurysm, dissection, or stenosis. Mild tortuosity. Outflow: Occlusion of the SFA just beyond its origin extending through its length and through the length of the popliteal artery. Runoff: Segmental collateral reconstitution of tibial runoff. Indeterminate evaluation of flow across the ankle LEFT Lower Extremity Inflow: Minimal calcified atheromatous plaque. No aneurysm, dissection, or stenosis. Mild tortuosity. Outflow: Common femoral deep femoral branches normal. Incomplete contrast opacification of the SFA. Very limited contrast opacification of the popliteal artery, such that significant stenosis or distal occlusion cannot be confidently excluded. Runoff: No distal contrast opacification  limiting evaluation Veins: No obvious venous abnormality within the limitations of this arterial phase study. Review of the MIP images confirms the above findings. NON-VASCULAR Lower chest: Moderate bilateral pleural effusions, new since previous. Adjacent consolidation/atelectasis  posteriorly in the visualized lung bases. Hepatobiliary: Gallbladder nondilated. No calcified gallstones or biliary ductal dilatation. Stable hepatic cysts. Pancreas: Unremarkable. No pancreatic ductal dilatation or surrounding inflammatory changes. Spleen: Normal in size. Heterogenous opacification presumably related to early scan timing. Adrenals/Urinary Tract: No adrenal mass. Early arterial phase renal opacification without evidence of focal lesion. No hydronephrosis. Urinary bladder incompletely distended. Stomach/Bowel: Stomach and small bowel are nondistended. Normal appendix. The colon is nondilated. Scattered distal descending and proximal sigmoid diverticula. No focal wall thickening or regional inflammatory change. Lymphatic: No abdominal or pelvic adenopathy. Reproductive: Mild prostate enlargement. Other: Left pelvic phlebolith.  No ascites.  No free air. Musculoskeletal: Advanced degenerative disc disease L3-L5. No fracture or worrisome bone lesion. IMPRESSION: 1. 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 Incomplete collateral reconstitution of tibial runoff. 2. Moderate bilateral pleural effusions, new since previous. 3. Descending and sigmoid diverticulosis. 4.  Aortic Atherosclerosis (ICD10-I70.0). Electronically Signed   By: Lucrezia Europe M.D.   On: 02/08/2022 12:15   DG Chest Port 1 View  Result Date: 02/08/2022 CLINICAL DATA:  Weakness SOB pain EXAM: PORTABLE CHEST 1 VIEW COMPARISON:  10/19/2021 FINDINGS: The heart size and mediastinal moderate right-sided pleural effusion unchanged. Prominent cardiac silhouette. No pneumothorax. Mild vascular congestion. No  pneumothorax. IMPRESSION: Mild CHF and right-sided pleural effusion. Electronically Signed   By: Sammie Bench M.D.   On: 02/08/2022 10:12      Patient Profile     67 y.o. male with past medical history of chronic systolic congestive heart failure, nonischemic cardiomyopathy, chronic stage III kidney disease, LV thrombus, peripheral vascular disease, atrial fibrillation, noncompliance admitted with ischemic right lower extremity.  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  Cardiac PET scan at [facility] March 2022 not suggestive of cardiac sarcoid.  Cardiac catheterization February 2022 showed no obstructive coronary disease.  Echocardiogram this admission shows ejection fraction less than 20%, mild left ventricular hypertrophy, restrictive filling, apical thrombus, mild RV dysfunction, severe biatrial enlargement, moderate pulmonary hypertension, mild mitral regurgitation, mildly dilated ascending aorta at 42 mm.  CTA this admission showed long segment occlusion of the right SFA and popliteal artery, moderate bilateral pleural effusions.  Patient has been noncompliant with medications including Entresto and apixaban due to expense.  Cardiology asked to evaluate for elevated troponin.  Patient underwent right lower extremity thromboembolectomy, right tibioperoneal trunk endarterectomy on January 22.    Assessment & Plan    1 status post right lower extremity thromboembolectomy/right tibioperoneal trunk endarterectomy-likely secondary to thromboembolic event from LV apical thrombus or paroxysmal atrial fibrillation.  Symptoms of right lower extremity pain have improved this morning.  Management per vascular surgery.  2 elevated troponin-previous cath in 2022 showed no coronary disease.  He is not complaining of chest pain.  Possible contribution from baseline renal insufficiency with superimposed ischemia of the right lower extremity.  I do not think  further ischemia evaluation is warranted.  3 apical thrombus-Patient had difficulty affording Eliquis.  Would ask case manager to see if there are options for patient assistance.  Otherwise may need to consider Coumadin in the future.  4 nonischemic cardiomyopathy-continue Toprol (blood pressure is low; will decrease to 50 mg daily).  Continue Farxiga.  Will plan to resume ARB or Entresto as an outpatient if blood pressure allows and once renal function stable.  5 chronic systolic congestive heart failure-he is not markedly volume overloaded on examination.  Will not  diurese at this point.  6 acute on chronic stage IIIa kidney disease-follow renal function while in hospital.  7 paroxysmal atrial fibrillation-patient remains in sinus rhythm.  Continue amiodarone, Toprol and resume anticoagulation as outlined above.  8 noncompliance-he was not taking his Eliquis or Entresto at time of admission.  I discussed the importance of medication compliance.  Will need consult with case manager for medication assistance at time of discharge.  For questions or updates, please contact [place] Please consult [email or web address] for contact info under        Signed, Kirk Ruths, MD  02/10/2022, 8:54 AM

## 2022-02-10 NOTE — Discharge Summary (Signed)
Between Hospital Discharge Summary  Patient name: [name] [name] Medical record number: [id number] Date of birth: Mar 13, 1955 Age: 67 y.o. Gender: male Date of Admission: 02/08/2022  Date of Discharge: 02/10/22 Admitting Physician: August Albino, MD  Primary Care Provider: [name], NP Consultants: Vascular surgery, cardiology  Indication for Hospitalization: RLE ischemia  Discharge Diagnoses/Problem List:  Principal Problem for Admission: RLE ischemia Other Problems addressed during stay:  Principal Problem:   Ischemic leg Active Problems:   Type 2 diabetes with nephropathy ([place])   Chronic renal failure, stage 3a ([place])   Left ventricular thrombus   Elevated troponin   CHF (congestive heart failure) (HCC)   PAF (paroxysmal atrial fibrillation) ([place])   Macrocytic anemia    Brief Hospital Course:  [name] [name] is a 67 y.o.male with a history of CHF, CKD 3, HTN, LV thrombus, PAF, T2DM  who was admitted to the [facility] Medicine Teaching Service at [facility] for ischemic right leg. His hospital course is detailed below:  RLE Ischemia Presented with worsening RLE pain and numbness, found to have occlusion of right SFA on CTA, also with severe occlusive disease on ABI.  Source thought to be cardioembolic given active LV thrombus and nonadherence to home Eliquis. [facility] [place] [facility].  Started on heparin.  Underwent thromboembolectomy, endarterectomy/vein patch angioplasty, right greater saphenous vein harvest with vascular surgery on 02/08/2022.  RLE remained perfused and hemoglobin remained stable after this procedure.  Transitioned to Eliquis upon discharge.  Will follow-up with vascular surgery in 2 to 3 weeks for staple removal. No PT/OT follow up recommended.  Active LV Thrombus  Chronic Ischemic Cardiomyopathy  Hx PAF, CHF Patient noted to have active LV thrombus on echo.  Reportedly had not been adherent with his home Eliquis  for several weeks due to cost issues.  Therefore it was considered that his leg ischemia was likely cardioembolic.    Patient did present with elevated troponin (trended down), and elevated BNP (though lower compared to past elevations).  However, he was euvolemic and his troponin elevation was considered to be 2/2 baseline renal insufficiency with superimposed ischemia.  EKG without acute changes, patient did not have any chest pain.  Cardiology did see him during his stay and recommended no further ischemic evaluation.  Did not require diuresis.  Patient remained stable in NSR during his stay.  Continued home amiodarone and Toprol (decreased dose to 50 mg daily due to soft BP).  Continued Farxiga.  May consider ARB or [facility] outpatient.  CKD Acute on chronic, had mild elevation in his creatinine during his stay.  Received fluids.  Recommend recheck outpatient.  Held spironolactone and metformin upon discharge.  T2DM Received SSI, held metformin as noted.  Macrocytic Anemia - Folate Deficiency Noted to have chronic macrocytic anemia, found to have low folate level.  Consider starting folate supplement outpatient.  Other chronic conditions were medically managed with home medications and formulary alternatives as necessary (hypothyroidism)  PCP Follow-up Recommendations: Ensure follow-up with vascular surgery Ensure cardiology follow up - planning to resume ARB or Entresto outpatient. Metoprolol decreased to '50mg'$  qd due to soft BP Ensure medication adherence and affordability - had difficulty affording Eliquis but was willing to pay for it upon discharge. May consider Coumadin if unable to continue Eliquis 3. Bump in Cr at d/c.  Recommend rechecking BMP  4. Held [place], Metformin given kidney function. Consider restarting  5. Folate low- start folic acid OP   Disposition: Home  Discharge Condition: Stable  Discharge Exam:  Vitals:   02/10/22 0754 02/10/22 1106  BP: 90/60 97/60   Pulse: 67   Resp: 19   Temp: 98.6 F (37 C)   SpO2: 96%    General: NAD, pleasant, able to participate in exam Cardiac: RRR, no murmurs auscultated Respiratory: CTAB, normal WOB Abdomen: soft, non-tender, non-distended Extremities: RLE well perfused, staples in place, no drainage or erythema. Motor and sensation intact   Significant Procedures:  1.  Right lower extremity thromboembolectomy via below-knee popliteal approach 2.  Right tibioperoneal trunk endarterectomy with vein patch angioplasty 3.  Harvest right greater saphenous vein  Significant Labs and Imaging:  Recent Labs  Lab 02/09/22 0148 02/10/22 0349  WBC 7.0 8.0  HGB 10.6* 10.1*  HCT 33.3* 31.8*  PLT 245 244   Recent Labs  Lab 02/09/22 0148 02/10/22 0349  NA 139 139  K 4.3 4.2  CL 109 111  CO2 22 21*  GLUCOSE 92 96  BUN 19 27*  CREATININE 2.14* 2.37*  CALCIUM 8.2* 8.3*  MG 2.0  --     ECHOCARDIOGRAM COMPLETE  Result Date: 02/08/2022    ECHOCARDIOGRAM REPORT   Patient Name:   [name] [name] Date of Exam: 02/08/2022 Medical Rec #:  [id number]        Height:       72.0 in Accession #:    [id number]       Weight:       184.0 lb Date of Birth:  1955-10-14       BSA:          2.056 m Patient Age:    67 years         BP:           119/98 mmHg Patient Gender: M                HR:           69 bpm. Exam Location:  Inpatient Procedure: 2D Echo, Cardiac Doppler, Color Doppler and Intracardiac            Opacification Agent Indications:    Chest Pain R07.9                  Appointment Information  History:        Patient has prior history of Echocardiogram examinations, most                 recent 10/17/2021. CHF and Cardiomyopathy, Signs/Symptoms:Chest                 Pain; Risk Factors:Hypertension, Diabetes, Dyslipidemia and                 Current Smoker.  Sonographer:    Greer Pickerel Referring Phys: [id number] Alphonse Guild BRANCH  Sonographer Comments: Image acquisition challenging due to respiratory motion. IMPRESSIONS   1. Large, mobile apical thrombus measuring 1.9 cm x 0.96 cm. Left ventricular ejection fraction, by estimation, is <20%. Left ventricular ejection fraction by PLAX is 12 %. The left ventricle has severely decreased function. The left ventricle demonstrates global hypokinesis. There is mild concentric left ventricular hypertrophy. Left ventricular diastolic parameters are consistent with Grade III diastolic dysfunction (restrictive). Elevated left ventricular end-diastolic pressure.  2. Right ventricular systolic function is mildly reduced. The right ventricular size is normal. There is moderately elevated pulmonary artery systolic pressure.  3. Left atrial size was severely dilated.  4. Right atrial size was severely dilated.  5.  The mitral valve is normal in structure. Mild mitral valve regurgitation. No evidence of mitral stenosis.  6. The aortic valve is normal in structure. Aortic valve regurgitation is not visualized. No aortic stenosis is present.  7. Aortic dilatation noted. There is mild dilatation of the ascending aorta, measuring 42 mm.  8. The inferior vena cava is normal in size with greater than 50% respiratory variability, suggesting right atrial pressure of 3 mmHg. FINDINGS  Left Ventricle: Large, mobile apical thrombus measuring 1.9 cm x 0.96 cm. Left ventricular ejection fraction, by estimation, is <20%. Left ventricular ejection fraction by PLAX is 12 %. The left ventricle has severely decreased function. The left ventricle demonstrates global hypokinesis. Definity contrast agent was given IV to delineate the left ventricular endocardial borders. The left ventricular internal cavity size was normal in size. There is mild concentric left ventricular hypertrophy. Left ventricular diastolic parameters are consistent with Grade III diastolic dysfunction (restrictive). Elevated left ventricular end-diastolic pressure. Right Ventricle: The right ventricular size is normal. No increase in right  ventricular wall thickness. Right ventricular systolic function is mildly reduced. There is moderately elevated pulmonary artery systolic pressure. The tricuspid regurgitant velocity is 3.56 m/s, and with an assumed right atrial pressure of 3 mmHg, the estimated right ventricular systolic pressure is 60.7 mmHg. Left Atrium: Left atrial size was severely dilated. Right Atrium: Right atrial size was severely dilated. Pericardium: There is no evidence of pericardial effusion. Mitral Valve: The mitral valve is normal in structure. Mild mitral valve regurgitation. No evidence of mitral valve stenosis. Tricuspid Valve: The tricuspid valve is normal in structure. Tricuspid valve regurgitation is trivial. No evidence of tricuspid stenosis. Aortic Valve: The aortic valve is normal in structure. Aortic valve regurgitation is not visualized. No aortic stenosis is present. Pulmonic Valve: The pulmonic valve was normal in structure. Pulmonic valve regurgitation is not visualized. No evidence of pulmonic stenosis. Aorta: Aortic dilatation noted. There is mild dilatation of the ascending aorta, measuring 42 mm. Venous: The inferior vena cava is normal in size with greater than 50% respiratory variability, suggesting right atrial pressure of 3 mmHg. IAS/Shunts: No atrial level shunt detected by color flow Doppler.  LEFT VENTRICLE PLAX 2D LV EF:         Left            Diastology                ventricular     LV e' medial:    4.67 cm/s                ejection        LV E/e' medial:  24.0                fraction by     LV e' lateral:   6.12 cm/s                PLAX is 12      LV E/e' lateral: 18.3                %. LVIDd:         5.50 cm LVIDs:         5.20 cm LV PW:         1.30 cm LV IVS:        1.10 cm LVOT diam:     1.90 cm LV SV:         31 LV SV Index:   15 LVOT Area:  2.84 cm  RIGHT VENTRICLE RV S prime:     9.36 cm/s TAPSE (M-mode): 1.8 cm LEFT ATRIUM              Index        RIGHT ATRIUM           Index LA diam:         4.60 cm  2.24 cm/m   RA Area:     21.80 cm LA Vol (A2C):   136.0 ml 66.14 ml/m  RA Volume:   82.40 ml  40.07 ml/m LA Vol (A4C):   96.2 ml  46.78 ml/m LA Biplane Vol: 127.0 ml 61.76 ml/m  AORTIC VALVE             PULMONIC VALVE LVOT Vmax:   71.80 cm/s  PR End Diast Vel: 7.84 msec LVOT Vmean:  45.600 cm/s LVOT VTI:    0.109 m  AORTA Ao Root diam: 4.10 cm Ao Asc diam:  4.20 cm MITRAL VALVE                TRICUSPID VALVE MV Area (PHT): 5.75 cm     TR Peak grad:   50.7 mmHg MV Decel Time: 132 msec     TR Vmax:        356.00 cm/s MR Peak grad: 54.2 mmHg MR Vmax:      368.00 cm/s   SHUNTS MV E velocity: 112.00 cm/s  Systemic VTI:  0.11 m MV A velocity: 26.60 cm/s   Systemic Diam: 1.90 cm MV E/A ratio:  4.21 Skeet Latch MD Electronically signed by Skeet Latch MD Signature Date/Time: 02/08/2022/3:53:15 PM    Final    US ARTERIAL ABI (SCREENING LOWER EXTREMITY)  Result Date: 02/08/2022 CLINICAL DATA:  Right foot cold, pain x2 weeks EXAM: NONINVASIVE PHYSIOLOGIC VASCULAR STUDY OF BILATERAL LOWER EXTREMITIES TECHNIQUE: Evaluation of both lower extremities were performed at rest, including calculation of ankle-brachial indices with single level Doppler, pressure and pulse volume recording. COMPARISON:  CTA from the same day FINDINGS: Right ABI:  0.36 Left ABI:  1.33 Right Lower Extremity:  Abnormal monophasic distal waveforms. Left Lower Extremity: Multiphasic waveform in the dorsalis pedis, monophasic in posterior tibial. IMPRESSION: 1. 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 See concurrent findings on CTA, reported separately. 2. Left ABI normal at rest. Electronically Signed   By: Lucrezia Europe M.D.   On: 02/08/2022 13:23   CT ANGIO AO+BIFEM W & OR WO CONTRAST  Result Date: 02/08/2022 CLINICAL DATA:  Right foot cold and painful x1 week EXAM: CT ANGIOGRAPHY OF ABDOMINAL AORTA WITH ILIOFEMORAL RUNOFF TECHNIQUE: Multidetector CT imaging of the abdomen, pelvis and lower extremities was  performed using the standard protocol during bolus administration of intravenous contrast. Multiplanar CT image reconstructions and MIPs were obtained to evaluate the vascular anatomy. RADIATION DOSE REDUCTION: This exam was performed according to the departmental dose-optimization program which includes automated exposure control, adjustment of the mA and/or kV according to patient size and/or use of iterative reconstruction technique. CONTRAST:  38m OMNIPAQUE IOHEXOL 350 MG/ML SOLN COMPARISON:  CT 06/28/2006 FINDINGS: VASCULAR Aorta: Mild calcified atheromatous plaque. No aneurysm, dissection, or stenosis. Celiac: Patent without evidence of aneurysm, dissection, vasculitis or significant stenosis. SMA: Patent without evidence of aneurysm, dissection, vasculitis or significant stenosis. Renals: Both renal arteries are patent without evidence of aneurysm, dissection, vasculitis, fibromuscular dysplasia or significant stenosis. IMA: Patent without evidence of aneurysm, dissection, vasculitis or significant stenosis. RIGHT Lower Extremity Inflow: Minimal calcified plaque. No  aneurysm, dissection, or stenosis. Mild tortuosity. Outflow: Occlusion of the SFA just beyond its origin extending through its length and through the length of the popliteal artery. Runoff: Segmental collateral reconstitution of tibial runoff. Indeterminate evaluation of flow across the ankle LEFT Lower Extremity Inflow: Minimal calcified atheromatous plaque. No aneurysm, dissection, or stenosis. Mild tortuosity. Outflow: Common femoral deep femoral branches normal. Incomplete contrast opacification of the SFA. Very limited contrast opacification of the popliteal artery, such that significant stenosis or distal occlusion cannot be confidently excluded. Runoff: No distal contrast opacification limiting evaluation Veins: No obvious venous abnormality within the limitations of this arterial phase study. Review of the MIP images confirms the above  findings. NON-VASCULAR Lower chest: Moderate bilateral pleural effusions, new since previous. Adjacent consolidation/atelectasis posteriorly in the visualized lung bases. Hepatobiliary: Gallbladder nondilated. No calcified gallstones or biliary ductal dilatation. Stable hepatic cysts. Pancreas: Unremarkable. No pancreatic ductal dilatation or surrounding inflammatory changes. Spleen: Normal in size. Heterogenous opacification presumably related to early scan timing. Adrenals/Urinary Tract: No adrenal mass. Early arterial phase renal opacification without evidence of focal lesion. No hydronephrosis. Urinary bladder incompletely distended. Stomach/Bowel: Stomach and small bowel are nondistended. Normal appendix. The colon is nondilated. Scattered distal descending and proximal sigmoid diverticula. No focal wall thickening or regional inflammatory change. Lymphatic: No abdominal or pelvic adenopathy. Reproductive: Mild prostate enlargement. Other: Left pelvic phlebolith.  No ascites.  No free air. Musculoskeletal: Advanced degenerative disc disease L3-L5. No fracture or worrisome bone lesion. IMPRESSION: 1. 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 Incomplete collateral reconstitution of tibial runoff. 2. Moderate bilateral pleural effusions, new since previous. 3. Descending and sigmoid diverticulosis. 4.  Aortic Atherosclerosis (ICD10-I70.0). Electronically Signed   By: Lucrezia Europe M.D.   On: 02/08/2022 12:15   DG Chest Port 1 View  Result Date: 02/08/2022 CLINICAL DATA:  Weakness SOB pain EXAM: PORTABLE CHEST 1 VIEW COMPARISON:  10/19/2021 FINDINGS: The heart size and mediastinal moderate right-sided pleural effusion unchanged. Prominent cardiac silhouette. No pneumothorax. Mild vascular congestion. No pneumothorax. IMPRESSION: Mild CHF and right-sided pleural effusion. Electronically Signed   By: Sammie Bench M.D.   On: 02/08/2022 10:12      Results/Tests Pending at Time of Discharge: n/a  Discharge Medications:  Allergies as of 02/10/2022       Reactions   Bee Pollen Swelling        Medication List     STOP taking these medications    Entresto 24-26 MG Generic drug: sacubitril-valsartan   metFORMIN 500 MG tablet Commonly known as: GLUCOPHAGE   potassium chloride SA 20 MEQ tablet Commonly known as: KLOR-CON M   simvastatin 20 MG tablet Commonly known as: ZOCOR   spironolactone 25 MG tablet Commonly known as: ALDACTONE       TAKE these medications    amiodarone 200 MG tablet Commonly known as: PACERONE Take 0.5 tablets (100 mg total) by mouth daily.   Aspirin Low Dose 81 MG tablet Generic drug: aspirin EC Take 1 tablet (81 mg total) by mouth daily at 6 (six) AM. Swallow whole. Start taking on: February 11, 2022   Eliquis 5 MG Tabs tablet Generic drug: apixaban Take 1 tablet (5 mg total) by mouth 2 (two) times daily. What changed: how much to take   Farxiga 10 MG Tabs tablet Generic drug: dapagliflozin propanediol Take 1 tablet (10 mg total) by mouth daily.   levothyroxine 137 MCG tablet Commonly known as: SYNTHROID Take 137 mcg by  mouth every morning.   Magnesium 250 MG Tabs Take 250 mg by mouth daily.   metoprolol succinate 50 MG 24 hr tablet Commonly known as: TOPROL-XL Take 1 tablet (50 mg total) by mouth daily. Take with or immediately following a meal. Start taking on: February 11, 2022 What changed:  medication strength how much to take additional instructions   Oxycodone HCl 10 MG Tabs Take 10 mg by mouth 3 (three) times daily.   pantoprazole 40 MG tablet Commonly known as: PROTONIX Take 1 tablet (40 mg total) by mouth daily. Start taking on: February 11, 2022   polyethylene glycol powder 17 GM/SCOOP powder Commonly known as: GLYCOLAX/MIRALAX Take 17 g by mouth daily.   rosuvastatin 20 MG tablet Commonly known as: CRESTOR Take 1 tablet (20 mg total) by mouth  daily. Start taking on: February 11, 2022   Senexon-S 8.6-50 MG tablet Generic drug: senna-docusate Take 1 tablet by mouth at bedtime as needed for mild constipation.   torsemide 20 MG tablet Commonly known as: DEMADEX Take 1 tablet (20 mg total) by mouth daily as needed (fluid).        Discharge Instructions: Please refer to Patient Instructions section of EMR for full details.  Patient was counseled important signs and symptoms that should prompt return to medical care, changes in medications, dietary instructions, activity restrictions, and follow up appointments.   Follow-Up Appointments:  Follow-up Information     Del [place]ia Comment, Lamar Benes, FNP Follow up.   Specialty: Family Medicine Why: TIME : 1 pm DATE: FEBRUARY 12 ,2024 Contact information: [address] [place] [address] [phone]         [facility] Follow up in 2 week(s).   Specialty: Vascular Surgery Why: sent Contact information: [place] [address] [place], [place], MD 02/10/2022, 12:55 PM PGY-1, [place]

## 2022-02-10 NOTE — Hospital Course (Addendum)
[name] [name] is a 67 y.o.male with a history of CHF, CKD 3, HTN, LV thrombus, PAF, T2DM  who was admitted to the [facility] Medicine Teaching Service at [facility] for ischemic right leg. His hospital course is detailed below:  RLE Ischemia Presented with worsening RLE pain and numbness, found to have occlusion of right SFA on CTA, also with severe occlusive disease on ABI.  Source thought to be cardioembolic given active LV thrombus and nonadherence to home Eliquis. [facility] [place] [facility].  Started on heparin.  Underwent thromboembolectomy, endarterectomy/vein patch angioplasty, right greater saphenous vein harvest with vascular surgery on 02/08/2022.  RLE remained perfused and hemoglobin remained stable after this procedure.  Transitioned to Eliquis upon discharge.  Will follow-up with vascular surgery in 2 to 3 weeks for staple removal. No PT/OT follow up recommended.  Active LV Thrombus  Chronic Ischemic Cardiomyopathy  Hx PAF, CHF Patient noted to have active LV thrombus on echo.  Reportedly had not been adherent with his home Eliquis for several weeks due to cost issues.  Therefore it was considered that his leg ischemia was likely cardioembolic.    Patient did present with elevated troponin (trended down), and elevated BNP (though lower compared to past elevations).  However, he was euvolemic and his troponin elevation was considered to be 2/2 baseline renal insufficiency with superimposed ischemia.  EKG without acute changes, patient did not have any chest pain.  Cardiology did see him during his stay and recommended no further ischemic evaluation.  Did not require diuresis.  Patient remained stable in NSR during his stay.  Continued home amiodarone and Toprol (decreased dose to 50 mg daily due to soft BP).  Continued Farxiga.  May consider ARB or [facility] outpatient.  CKD Acute on chronic, had mild elevation in his creatinine during his stay.  Received fluids.  Recommend  recheck outpatient.  Held spironolactone and metformin upon discharge.  T2DM Received SSI, held metformin as noted.  Macrocytic Anemia - Folate Deficiency Noted to have chronic macrocytic anemia, found to have low folate level.  Consider starting folate supplement outpatient.  Other chronic conditions were medically managed with home medications and formulary alternatives as necessary (hypothyroidism)  PCP Follow-up Recommendations: Ensure follow-up with vascular surgery Ensure cardiology follow up - planning to resume ARB or Entresto outpatient. Metoprolol decreased to '50mg'$  qd due to soft BP Ensure medication adherence and affordability - had difficulty affording Eliquis but was willing to pay for it upon discharge. May consider Coumadin if unable to continue Eliquis 3. Bump in Cr at d/c.  Recommend rechecking BMP  4. Held [place], Metformin given kidney function. Consider restarting  5. Folate low- start folic acid OP

## 2022-02-10 NOTE — TOC Transition Note (Signed)
Transition of Care (TOC) - CM/SW Discharge Note Marvetta Gibbons RN, BSN Transitions of Care Unit 4E- RN Case Manager See Treatment Team for direct phone #   Patient Details  Name: [name] [name] MRN: [id number] Date of Birth: January 21, 1955  [facility]) CM/SW Contact:  Dawayne Patricia, RN Phone Number: 02/10/2022, 2:42 PM   Clinical Narrative:    Pt stable for transition home today, family to transport home.  Pt has had difficulty affording meds (Eliquis, [place] Records) CM spoke with PharmD on unit to see if they could assist with any pt assistance applications. Pt is not eligible for any drug coupons as he has drug coverage and has been on these meds PTA.  Unit PharmD to look into assistance options to see what pt may be eligible for, potential assistance through HF clinic.   CM has made new PCP appointment with provider in [place] as per pt request. Info placed on AVS.   TOC pharmacy to assist with meds to bedside prior to discharge.    No further TOC needs noted.   Final next level of care: Home/Self Care Barriers to Discharge: No Barriers Identified   Patient Goals and CMS Choice   Choice offered to / list presented to : NA  Discharge Placement              Home           Discharge Plan and Services Additional resources added to the After Visit Summary for     Discharge Planning Services: CM Consult, Follow-up appt scheduled Post Acute Care Choice: NA                               Social Determinants of Health (SDOH) Interventions SDOH Screenings   Food Insecurity: No Food Insecurity (10/17/2021)  Housing: Low Risk  (10/17/2021)  Transportation Needs: No Transportation Needs (10/17/2021)  Utilities: Not At Risk (10/17/2021)  Tobacco Use: High Risk (02/09/2022)     Readmission Risk Interventions    02/10/2022    2:42 PM  Readmission Risk Prevention Plan  Transportation Screening Complete  HRI or [place]  Complete  Social Work Consult for [place] Planning/Counseling Complete  Palliative Care Screening Not Applicable  Medication Review Press photographer) Complete

## 2022-02-10 NOTE — Progress Notes (Signed)
Pt discharged. Pt received AVS and all questions were answered. Pt stated that he brought a walker in with him and asked if I could find it for him. I spoke with the previous departments that the pt went to and was told that they would look for it. Pt did not want to wait for walker to be found. Pt discharged and if it is found, pt will be called to pick it up. Pt left with all other belongings.

## 2022-02-10 NOTE — Progress Notes (Signed)
Mobility Specialist Progress Note:   02/10/22 1005  Mobility  Activity Ambulated with assistance in room  Level of Assistance Standby assist, set-up cues, supervision of patient - no hands on  Assistive Device Front wheel walker  Distance Ambulated (ft) 10 ft  Activity Response Tolerated fair  $Mobility charge 1 Mobility   Pt in bed willing to participate in mobility. Complaints of RLE pain. Pt able to ambulate to the other side of the bed but needed to sit and deferred further ambulation d/t pain. Left in bed with call bell in reach and all needs met.   Gareth Eagle Neilah Fulwider Mobility Specialist Please contact via [employer] or  Rehab Office at [phone]

## 2022-02-10 NOTE — Progress Notes (Signed)
Noted referral for patient Hoping to establish w/ PCP in [place].   Call made and spoke w/  TAYLOR @ [place] # [phone] OPT-1   FOR APPT:   TIME :  1 PM  DATE : FEBRUARY 12 , 2024  DOCTOR : Allison Quarry  LOCATION : [facility]  [place], [place] [address]

## 2022-02-10 NOTE — Progress Notes (Addendum)
  Progress Note    02/10/2022 7:34 AM 2 Days Post-Op  Subjective:  numbness R foot   Vitals:   02/09/22 2341 02/10/22 0430  BP: 90/70 (!) 89/61  Pulse: 64 63  Resp: 14 20  Temp: 98.1 F (36.7 C) 98.2 F (36.8 C)  SpO2: 97% 98%   Physical Exam: Lungs:  non labored Incisions:  R popliteal incision c/d/i Extremities:  palpable R ATA and PT; left foot warm and well perfused; no palpable pedal pulses LLE Abdomen:  soft, NT, ND Neurologic: A&O  CBC    Component Value Date/Time   WBC 8.0 02/10/2022 0349   RBC 3.05 (L) 02/10/2022 0349   HGB 10.1 (L) 02/10/2022 0349   HCT 31.8 (L) 02/10/2022 0349   PLT 244 02/10/2022 0349   MCV 104.3 (H) 02/10/2022 0349   MCH 33.1 02/10/2022 0349   MCHC 31.8 02/10/2022 0349   RDW 13.5 02/10/2022 0349   LYMPHSABS 1.4 02/08/2022 0809   MONOABS 0.5 02/08/2022 0809   EOSABS 0.1 02/08/2022 0809   BASOSABS 0.0 02/08/2022 0809    BMET    Component Value Date/Time   NA 139 02/10/2022 0349   K 4.2 02/10/2022 0349   CL 111 02/10/2022 0349   CO2 21 (L) 02/10/2022 0349   GLUCOSE 96 02/10/2022 0349   BUN 27 (H) 02/10/2022 0349   CREATININE 2.37 (H) 02/10/2022 0349   CREATININE 0.89 06/05/2014 0928   CALCIUM 8.3 (L) 02/10/2022 0349   GFRNONAA 30 (L) 02/10/2022 0349   GFRAA 57 (L) 10/01/2019 1537    INR    Component Value Date/Time   INR 1.7 (H) 07/07/2019 0533     Intake/Output Summary (Last 24 hours) at 02/10/2022 0734 Last data filed at 02/10/2022 0430 Gross per 24 hour  Intake 1629.95 ml  Output 1800 ml  Net -170.05 ml     Assessment/Plan:  67 y.o. male is s/p RLE thrombectomy 2 Days Post-Op   R LE well perfused with palpable PT and ATA pulses R popliteal incision well appearing; calf soft Ok for transition to DOAC from heparin Ok for discharge from vascular standpoint; office will arrange staple removal in 2-3 weeks   Dagoberto Ligas, PA-C [facility] [phone] 02/10/2022 7:34 AM  I have  independently interviewed and examined patient and agree with PA assessment and plan above.   Octavius Shin C. Donzetta Matters, MD [facility] Office: [phone] Pager: [phone]

## 2022-02-19 NOTE — Progress Notes (Incomplete)
PCP: [name], NP Cardiology: Dr. Harl Bowie HF Cardiology: Dr. Perrin Smack Geerts is a 67 y.o. with long history of cardiomyopathy, paroxysmal atrial fibrillation, and CKD stage 3 was referred by Dr. Harl Bowie for evaluation of CHF. 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 Patient was diagnosed with CHF in 2013.  At that time, EF was 15%.  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  Since that time, repeat echoes have shown EF < 35%.  Most recent study was a cardiac MRI in 1/22.  This showed LV EF 26%.  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  Patient has no family history of sarcoidosis.  His father had CHF, he is not sure of etiology.  Patient was found to have an LV thrombus.  Initially on warfarin, now on Eliquis.  He has a history of paroxysmal AF, currently in NSR on amiodarone.  He is still smoking 1-2 cigarettes/day.  He drinks a couple beers/week.   LHC/RHC in 2/22 showed no significant CAD, low filling pressures, and low but not markedly low cardiac index. CT chest did not show definite evidence for pulmonary sarcoidosis.  Cardiac PET in 3/22 showed no active inflammation, no evidence for active cardiac sarcoidosis.    Stable NYHA II symptoms at follow up 10/22, Toprol increased, amiodarone reduced, and he was referred to EP for ablation & ICD consideration.  Echo today, results pending.  Today he returns for HF follow up. He gets fatigued going up stairs but otherwise no significant exertional dyspnea walking on flat ground. Overall feeling fine. Denies CP, dizziness, palpitations, abnormal bleeding, edema, or PND/Orthopnea. Appetite ok. No fever or chills. Weight at home 213-214 pounds. Taking all medications. He took torsemide once or twice this past week for pedal edema.  Labs (12/21): K 4.7, creatinine 1.57 Labs (2/22): ACE level normal, K 4.7, creatinine 1.75 Labs  (3/22): K 4.5, creatinine 1.59 Labs (7/22): LDL 48, K 4, creatinine 1.93 Labs (10/22): K 4.2, creatinine 1.74  ECG (personally reviewed): None ordered today.  PMH: 1. Hyperlipidemia 2. Atrial fibrillation: Paroxysmal, first diagnosed in 4/21.  3. CKD stage 3 4. H/o NSVT 5. Type 2 diabetes 6. Chronic systolic CHF: Echo in 3710 with EF 15%.  LHC in 2013 with nonobstructive CAD.  - Echo (2015): EF 30-35%.  - Echo (4/21): EF 25-30%, moderate RV dysfunction, LV thrombus.  - Cardiac MRI (1/22): LV EF 26% with mild LVE, akinesis of the basal to mid anteroseptal wall, RV EF 33%, delayed enhancement showed transmural LGE basal anteroseptal wall, mid-myocardial LGE in the septum, subendocardial to mid myocardial LGE in the basal anterior, basal inferoseptal, and basal inferior walls.  Most consistent with cardiac sarcoidosis.  - CT chest (2/22): respiratory bronchiolitis, emphysema, no evidence for pulmonary sarcoidosis.  - LHC/RHC (2/22): No significant CAD; mean RA 3, PA 28/5, mean PCWP 10, CI 2.04 - Cardiac PET (3/22): No active inflammation noted.  7. H/o LV thrombus  Social History   Socioeconomic History   Marital status: Married    Spouse name: Not on file   Number of children: 0   Years of education: Not on file   Highest education level: Not on file  Occupational History   Occupation: disabled  Tobacco Use   Smoking status: Some Days    Packs/day: 0.25    Types: Cigarettes, E-cigarettes    Start date: 10/13/1982    Last attempt to quit: 09/19/2011  Years since quitting: 10.4   Smokeless tobacco: Never   Tobacco comments:    electronic cig for 4 months   Vaping Use   Vaping Use: Never used  Substance and Sexual Activity   Alcohol use: Not Currently    Comment: weekends   Drug use: No   Sexual activity: Not on file  Other Topics Concern   Not on file  Social History Narrative   Patient is right handed.   Patient seldom drinks caffeine.   Social Determinants of Health    Financial Resource Strain: Not on file  Food Insecurity: No Food Insecurity (10/17/2021)   Hunger Vital Sign    Worried About Running Out of Food in the Last Year: Never true    Ran Out of Food in the Last Year: Never true  Transportation Needs: No Transportation Needs (10/17/2021)   PRAPARE - Hydrologist (Medical): No    Lack of Transportation (Non-Medical): No  Physical Activity: Not on file  Stress: Not on file  Social Connections: Not on file  Intimate Partner Violence: Not At Risk (10/17/2021)   Humiliation, Afraid, Rape, and Kick questionnaire    Fear of Current or Ex-Partner: No    Emotionally Abused: No    Physically Abused: No    Sexually Abused: No   Family History  Problem Relation Age of Onset   Heart attack Father    Heart failure Father    Cancer Mother        Multiple myeloma   Heart disease Sister    ROS: All systems reviewed and negative except as per HPI.   Current Outpatient Medications  Medication Sig Dispense Refill   amiodarone (PACERONE) 200 MG tablet Take 0.5 tablets (100 mg total) by mouth daily. 45 tablet 3   apixaban (ELIQUIS) 5 MG TABS tablet Take 1 tablet (5 mg total) by mouth 2 (two) times daily. 60 tablet 0   aspirin EC 81 MG tablet Take 1 tablet (81 mg total) by mouth daily at 6 (six) AM. Swallow whole. 30 tablet 12   dapagliflozin propanediol (FARXIGA) 10 MG TABS tablet Take 1 tablet (10 mg total) by mouth daily. 30 tablet 11   levothyroxine (SYNTHROID) 137 MCG tablet Take 137 mcg by mouth every morning.     Magnesium 250 MG TABS Take 250 mg by mouth daily.     metoprolol succinate (TOPROL-XL) 50 MG 24 hr tablet Take 1 tablet (50 mg total) by mouth daily. Take with or immediately following a meal. 30 tablet 0   Oxycodone HCl 10 MG TABS Take 10 mg by mouth 3 (three) times daily.     pantoprazole (PROTONIX) 40 MG tablet Take 1 tablet (40 mg total) by mouth daily. 30 tablet 0   polyethylene glycol powder  (GLYCOLAX/MIRALAX) 17 GM/SCOOP powder Take 17 g by mouth daily. 500 g 0   rosuvastatin (CRESTOR) 20 MG tablet Take 1 tablet (20 mg total) by mouth daily. 30 tablet 0   senna-docusate (SENOKOT-S) 8.6-50 MG tablet Take 1 tablet by mouth at bedtime as needed for mild constipation. 30 tablet 0   torsemide (DEMADEX) 20 MG tablet Take 1 tablet (20 mg total) by mouth daily as needed (fluid). 30 tablet 1   No current facility-administered medications for this visit.   There were no vitals taken for this visit.  Wt Readings from Last 3 Encounters:  02/08/22 83.5 kg (184 lb)  10/19/21 90 kg (198 lb 6.6 oz)  09/14/21 95.3 kg (  210 lb)   General:  NAD. No resp difficulty HEENT: Normal Neck: Supple. No JVD. Carotids 2+ bilat; no bruits. No lymphadenopathy or thryomegaly appreciated. Cor: PMI nondisplaced. Regular rate & rhythm. No rubs, gallops or murmurs. Lungs: Clear Abdomen: Soft, nontender, nondistended. No hepatosplenomegaly. No bruits or masses. Good bowel sounds. Extremities: No cyanosis, clubbing, rash, edema Neuro: Alert & oriented x 3, cranial nerves grossly intact. Moves all 4 extremities w/o difficulty. Affect pleasant.  Assessment/Plan: 1. Chronic systolic CHF: Etiology of cardiomyopathy is uncertain.  EF has been known to be low since 2013, cardiac cath in 2013 showed nonobstructive CAD.  Most recent study was a cardiac MRI in 1/22 showing LV EF 26% with mild LVE, akinesis of the basal to mid anteroseptal wall, RV EF 33%, delayed enhancement showed transmural LGE basal anteroseptal wall, mid-myocardial LGE in the septum, subendocardial to mid myocardial LGE in the basal anterior, basal inferoseptal, and basal inferior walls.  Most consistent with cardiac sarcoidosis.  No family history of sarcoidosis, no known lung disease, rashes, or joint pain. CT chest in 2/22 not suggestive of pulmonary sarcoidosis.  Finally, cardiac PET in 3/22 did not show evidence for active inflammation.  Therefore,  no evidence for active cardiac sarcoidosis (cannot rule out "burned out"/inactive sarcoidosis).  RHC/LHC in 2/22 showed no significant CAD, low filling pressures, and CI 2.04.  NYHA class II symptoms, not volume overloaded on exam today. No BP room to titrate GDMT. - Continue Entresto 24/26 bid, BMET today.  Dose was decreased from 49/51 bid due to worsening creatinine.   - Continue spironolactone 25 mg daily.  - Continue Toprol XL 50 mg bid.    - Continue dapagliflozin 10 mg daily. No GU symptoms. - He does not need a diuretic.  - He is not CRT candidate with RBBB.  We further discussed rationale for ICD recommendation given persistently low EF and extensive LGE on cMRI. He has been referred to EP to discuss both ICD and atrial fibrillation ablation.  - Echo today, results pending. 2. Atrial fibrillation: Paroxysmal.  He is regular on exam today. - Continue apixaban.  - Continue amiodarone 100 mg daily.  LFTs 10/22 ok.  He will need a regular eye exam given amiodarone use.  He has hypothyroidism followed by PCP.  - He has been referred to EP for atrial fibrillation ablation consideration.  This would likely allow him to stop amiodarone altogether.  3. LV thrombus: He has been on Eliquis. No thrombus on 1/22 cMRI.  4. CKD: Stage 3.  BMET today.  5. OSA: Strong suspicion for OSA. Home sleep study arranged. Encouraged him to complete this soon.  Followup in 3 months with Dr. Wynema Birch [facility] FNP 02/19/2022

## 2022-02-22 ENCOUNTER — Encounter (HOSPITAL_COMMUNITY): Payer: Medicare HMO

## 2022-02-22 ENCOUNTER — Telehealth: Payer: Self-pay

## 2022-02-22 ENCOUNTER — Other Ambulatory Visit: Payer: Self-pay | Admitting: Physician Assistant

## 2022-02-22 NOTE — Telephone Encounter (Signed)
Pt called c/o pain and wants pain medication sent in to pharmacy.  Pt called, two identifiers used. Oxycodone 10 mg on pt's med list. Informed him that was the reason he didn't get anything else prescribed to him at d/c. Pt stated that he hasn't been on those for over a month, but refused to answer why he didn't ensure it was taken off his medication list prior to surgery. Pt stated he is supposed to see a new doctor this week for his chronic pain and daily regimen. Pt stated that his foot was swollen, but he claims that he is elevating it properly. He c/o of pain running down his leg.  Spoke with [employer], [place] who refused to give him any pain medication. Pt's surgery was 1/22 and his staple removal is 2/7.

## 2022-02-24 ENCOUNTER — Ambulatory Visit (INDEPENDENT_AMBULATORY_CARE_PROVIDER_SITE_OTHER): Payer: Medicare HMO | Admitting: Physician Assistant

## 2022-02-24 VITALS — BP 86/56 | HR 69 | Temp 98.0°F | Ht 72.0 in | Wt 198.0 lb

## 2022-02-24 DIAGNOSIS — I998 Other disorder of circulatory system: Secondary | ICD-10-CM

## 2022-02-24 MED ORDER — CEPHALEXIN 500 MG PO CAPS: 500.0000 mg | ORAL_CAPSULE | Freq: Three times a day (TID) | ORAL | 1 refills | Status: DC

## 2022-02-24 MED ORDER — OXYCODONE-ACETAMINOPHEN 5-325 MG PO TABS: 1.0000 | ORAL_TABLET | Freq: Four times a day (QID) | ORAL | 0 refills | Status: DC | PRN

## 2022-02-24 NOTE — Progress Notes (Signed)
POST OPERATIVE OFFICE NOTE    CC:  F/u for surgery  HPI:  This is a 67 y.o. male who has recent history of progressive right LE pain.  He was seen emergently on 02/08/22 in the [facility].  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  He has a left ventricular thrombus identified on echo and patient with known atrial fibrillation has been off of anticoagulation for at least 2 weeks.   He is now s/p Right lower extremity thromboembolectomy via below-knee popliteal approach and vein patch angioplasty.      Pt returns today for follow up.  Pt states he has had increased pain and edema in B LE.  The right below knee incision is weeping clear fluid.  He states he has not had fever or chills.  He has residual numbness in the right foot.    Allergies  Allergen Reactions   Bee Pollen Swelling    Current Outpatient Medications  Medication Sig Dispense Refill   amiodarone (PACERONE) 200 MG tablet Take 0.5 tablets (100 mg total) by mouth daily. 45 tablet 3   apixaban (ELIQUIS) 5 MG TABS tablet Take 1 tablet (5 mg total) by mouth 2 (two) times daily. 60 tablet 0   aspirin EC 81 MG tablet Take 1 tablet (81 mg total) by mouth daily at 6 (six) AM. Swallow whole. 30 tablet 12   cephALEXin (KEFLEX) 500 MG capsule Take 1 capsule (500 mg total) by mouth 3 (three) times daily. 21 capsule 1   dapagliflozin propanediol (FARXIGA) 10 MG TABS tablet Take 1 tablet (10 mg total) by mouth daily. 30 tablet 11   levothyroxine (SYNTHROID) 137 MCG tablet Take 137 mcg by mouth every morning.     Magnesium 250 MG TABS Take 250 mg by mouth daily.     metoprolol succinate (TOPROL-XL) 50 MG 24 hr tablet Take 1 tablet (50 mg total) by mouth daily. Take with or immediately following a meal. 30 tablet 0   Oxycodone HCl 10 MG TABS Take 10 mg by mouth 3 (three) times daily.     oxyCODONE-acetaminophen (PERCOCET/ROXICET) 5-325 MG  tablet Take 1 tablet by mouth every 6 (six) hours as needed. 20 tablet 0   pantoprazole (PROTONIX) 40 MG tablet Take 1 tablet (40 mg total) by mouth daily. 30 tablet 0   polyethylene glycol powder (GLYCOLAX/MIRALAX) 17 GM/SCOOP powder Take 17 g by mouth daily. 500 g 0   rosuvastatin (CRESTOR) 20 MG tablet Take 1 tablet (20 mg total) by mouth daily. 30 tablet 0   senna-docusate (SENOKOT-S) 8.6-50 MG tablet Take 1 tablet by mouth at bedtime as needed for mild constipation. 30 tablet 0   torsemide (DEMADEX) 20 MG tablet Take 1 tablet (20 mg total) by mouth daily as needed (fluid). 30 tablet 1   No current facility-administered medications for this visit.     ROS:  See HPI  Physical Exam:    Incision:  edema with weeping, no frank purulent drainage.  Mild erythema with moderate edema.  Extremities:  palpable DP pulse, doppler PT signal, motor intact Edema with sock marks B LE  Every other staple was removed    Assessment/Plan:  This is a 67 y.o. male who is s/p:Right LE thromboembolectomy via below-knee popliteal approach and vein patch angioplasty.      Plan for mild compression with dry guaze, ace wrap, elevation.  I gave him  oxycodone 5/325 mg # 20 until he follows up with his new pain Doctor.  He will also be placed on prophylaxis Keflex 500 mg TID for 7 days.  F/U in 1-2 weeks for wound check and staple removal.  He has Torsemide at home for fluid over load as needed.  He will continue Eliquis and ASA with daily Statin for maximum,m medical management.    Roxy Horseman PA-C [facility] [phone]   Clinic MD:  Donzetta Matters

## 2022-03-01 ENCOUNTER — Ambulatory Visit (INDEPENDENT_AMBULATORY_CARE_PROVIDER_SITE_OTHER): Payer: Medicare HMO | Admitting: Family Medicine

## 2022-03-01 ENCOUNTER — Encounter: Payer: Self-pay | Admitting: Family Medicine

## 2022-03-01 VITALS — BP 113/78 | HR 66 | Ht 72.0 in | Wt 192.0 lb

## 2022-03-01 DIAGNOSIS — M549 Dorsalgia, unspecified: Secondary | ICD-10-CM | POA: Insufficient documentation

## 2022-03-01 DIAGNOSIS — M4722 Other spondylosis with radiculopathy, cervical region: Secondary | ICD-10-CM | POA: Insufficient documentation

## 2022-03-01 DIAGNOSIS — Z7185 Encounter for immunization safety counseling: Secondary | ICD-10-CM | POA: Diagnosis not present

## 2022-03-01 DIAGNOSIS — G894 Chronic pain syndrome: Secondary | ICD-10-CM

## 2022-03-01 DIAGNOSIS — Z0001 Encounter for general adult medical examination with abnormal findings: Secondary | ICD-10-CM | POA: Diagnosis not present

## 2022-03-01 DIAGNOSIS — M47816 Spondylosis without myelopathy or radiculopathy, lumbar region: Secondary | ICD-10-CM | POA: Insufficient documentation

## 2022-03-01 DIAGNOSIS — I10 Essential (primary) hypertension: Secondary | ICD-10-CM

## 2022-03-01 DIAGNOSIS — Z23 Encounter for immunization: Secondary | ICD-10-CM

## 2022-03-01 DIAGNOSIS — M542 Cervicalgia: Secondary | ICD-10-CM | POA: Insufficient documentation

## 2022-03-01 DIAGNOSIS — E785 Hyperlipidemia, unspecified: Secondary | ICD-10-CM | POA: Diagnosis not present

## 2022-03-01 DIAGNOSIS — M51369 Other intervertebral disc degeneration, lumbar region without mention of lumbar back pain or lower extremity pain: Secondary | ICD-10-CM | POA: Insufficient documentation

## 2022-03-01 DIAGNOSIS — R202 Paresthesia of skin: Secondary | ICD-10-CM | POA: Insufficient documentation

## 2022-03-01 DIAGNOSIS — M5136 Other intervertebral disc degeneration, lumbar region: Secondary | ICD-10-CM | POA: Insufficient documentation

## 2022-03-01 DIAGNOSIS — R7301 Impaired fasting glucose: Secondary | ICD-10-CM

## 2022-03-01 DIAGNOSIS — Z1211 Encounter for screening for malignant neoplasm of colon: Secondary | ICD-10-CM

## 2022-03-01 DIAGNOSIS — Z135 Encounter for screening for eye and ear disorders: Secondary | ICD-10-CM

## 2022-03-01 DIAGNOSIS — Z1159 Encounter for screening for other viral diseases: Secondary | ICD-10-CM

## 2022-03-01 DIAGNOSIS — E039 Hypothyroidism, unspecified: Secondary | ICD-10-CM

## 2022-03-01 DIAGNOSIS — M5030 Other cervical disc degeneration, unspecified cervical region: Secondary | ICD-10-CM | POA: Insufficient documentation

## 2022-03-01 DIAGNOSIS — R0602 Shortness of breath: Secondary | ICD-10-CM

## 2022-03-01 MED ORDER — OXYCODONE-ACETAMINOPHEN 5-325 MG PO TABS: 1.0000 | ORAL_TABLET | Freq: Four times a day (QID) | ORAL | 0 refills | Status: AC | PRN

## 2022-03-01 MED ORDER — ALBUTEROL SULFATE HFA 108 (90 BASE) MCG/ACT IN AERS: 2.0000 | INHALATION_SPRAY | Freq: Four times a day (QID) | RESPIRATORY_TRACT | 2 refills | Status: DC | PRN

## 2022-03-01 NOTE — Progress Notes (Signed)
Complete physical exam  Patient: [name] [name]   DOB: 08-18-1955   67 y.o. Male  MRN: [id number]  Subjective:    No chief complaint on file.   [name] [name] is a 67 y.o. male who presents today for a complete physical exam. He reports consuming a general diet. The patient does not participate in regular exercise at present. He generally feels tired. He reports sleeping poorly. He does have additional problems to discuss today.    Most recent fall risk assessment:    03/01/2022    1:13 PM  Fall Risk   Falls in the past year? 0  Number falls in past yr: 0  Injury with Fall? 0  Risk for fall due to : No Fall Risks  Follow up Falls evaluation completed     Most recent depression screenings:    03/01/2022    1:14 PM  PHQ 2/9 Scores  PHQ - 2 Score 3  PHQ- 9 Score 11    Vision:Not within last year   Patient Care Team: Del Eli Hose, [place] as PCP - General (Family Medicine) Harl Bowie, Alphonse Guild, MD as PCP - Cardiology (Cardiology) Larey Dresser, MD as PCP - Advanced Heart Failure (Cardiology) Jovita Gamma, MD as Consulting Physician (Neurosurgery) Sanjuana Kava, MD as Consulting Physician (Orthopedic Surgery)   Outpatient Medications Prior to Visit  Medication Sig   amiodarone (PACERONE) 200 MG tablet Take 0.5 tablets (100 mg total) by mouth daily.   apixaban (ELIQUIS) 5 MG TABS tablet Take 1 tablet (5 mg total) by mouth 2 (two) times daily.   aspirin EC 81 MG tablet Take 1 tablet (81 mg total) by mouth daily at 6 (six) AM. Swallow whole.   cephALEXin (KEFLEX) 500 MG capsule Take 1 capsule (500 mg total) by mouth 3 (three) times daily.   dapagliflozin propanediol (FARXIGA) 10 MG TABS tablet Take 1 tablet (10 mg total) by mouth daily.   levothyroxine (SYNTHROID) 137 MCG tablet Take 137 mcg by mouth every morning.   Magnesium 250 MG TABS Take 250 mg by mouth daily.   metoprolol succinate (TOPROL-XL) 50 MG 24 hr tablet Take 1 tablet (50 mg total) by mouth  daily. Take with or immediately following a meal.   pantoprazole (PROTONIX) 40 MG tablet Take 1 tablet (40 mg total) by mouth daily.   rosuvastatin (CRESTOR) 20 MG tablet Take 1 tablet (20 mg total) by mouth daily.   Study - DAPA TIMI 68 - dapagliflozin (FARXIGA) 10 mg or placebo tablet (PI-Dalton McLean) Take by mouth.   torsemide (DEMADEX) 20 MG tablet Take 1 tablet (20 mg total) by mouth daily as needed (fluid).   [DISCONTINUED] oxyCODONE-acetaminophen (PERCOCET/ROXICET) 5-325 MG tablet Take 1 tablet by mouth every 6 (six) hours as needed.   Oxycodone HCl 10 MG TABS Take 10 mg by mouth 3 (three) times daily.   polyethylene glycol powder (GLYCOLAX/MIRALAX) 17 GM/SCOOP powder Take 17 g by mouth daily.   senna-docusate (SENOKOT-S) 8.6-50 MG tablet Take 1 tablet by mouth at bedtime as needed for mild constipation.   No facility-administered medications prior to visit.    Review of Systems  Constitutional:  Negative for chills and fever.  HENT:  Negative for ear pain and tinnitus.   Eyes:  Positive for blurred vision and double vision.  Respiratory:  Positive for shortness of breath.   Cardiovascular:  Negative for chest pain and palpitations.  Gastrointestinal:  Negative for abdominal pain, nausea and vomiting.  Genitourinary:  Negative for  dysuria.  Musculoskeletal:  Positive for back pain.  Skin:  Negative for itching.  Neurological:  Negative for dizziness and headaches.  Psychiatric/Behavioral:  Positive for depression.        Objective:    BP 113/78   Pulse 66   Ht 6' (1.829 m)   Wt 192 lb (87.1 kg)   SpO2 94%   BMI 26.04 kg/m  BP Readings from Last 3 Encounters:  03/01/22 113/78  02/24/22 (!) 86/56  02/10/22 97/60      Physical Exam HENT:     Head: Normocephalic and atraumatic.     Right Ear: Tympanic membrane normal.     Left Ear: Tympanic membrane normal.     Nose: Nose normal. No congestion.     Mouth/Throat:     Mouth: Mucous membranes are moist.  Eyes:      Pupils: Pupils are equal, round, and reactive to light.  Cardiovascular:     Rate and Rhythm: Normal rate.     Pulses: Normal pulses.  Pulmonary:     Effort: Pulmonary effort is normal. No respiratory distress.     Breath sounds: Normal breath sounds.  Abdominal:     General: Bowel sounds are normal. There is no distension.     Palpations: Abdomen is soft.  Musculoskeletal:        General: Swelling present.     Cervical back: Normal range of motion.     Right lower leg: Edema present.     Left lower leg: Edema present.  Skin:    General: Skin is warm and dry.     Capillary Refill: Capillary refill takes less than 2 seconds.  Neurological:     Mental Status: He is alert. Mental status is at baseline.  Psychiatric:        Thought Content: Thought content normal.      No results found for any visits on 03/01/22.    Assessment & Plan:    Routine Health Maintenance and Physical Exam  Immunization History  Administered Date(s) Administered   Influenza,inj,Quad PF,6+ Mos 11/02/2013   PNEUMOCOCCAL CONJUGATE-20 03/01/2022    Health Maintenance  Topic Date Due   Medicare Annual Wellness (AWV)  Never done   Hepatitis C Screening  Never done   DTaP/Tdap/Td (1 - Tdap) Never done   Zoster Vaccines- Shingrix (1 of 2) Never done   Diabetic kidney evaluation - Urine ACR  08/14/2015   OPHTHALMOLOGY EXAM  08/14/2015   COLONOSCOPY (Pts 45-85yr Insurance coverage will need to be confirmed)  01/03/2022   COVID-19 Vaccine (1) 03/17/2022 (Originally 12/04/1960)   INFLUENZA VACCINE  04/18/2022 (Originally 08/18/2021)   HEMOGLOBIN A1C  04/17/2022   Diabetic kidney evaluation - eGFR measurement  02/11/2023   FOOT EXAM  03/02/2023   Pneumonia Vaccine 67 Years old  Completed   HIV Screening  Completed   HPV VACCINES  Aged Out    Discussed health benefits of physical activity, and encouraged him to engage in regular exercise appropriate for his age and condition.  Chronic pain  syndrome Assessment & Plan: Referral to pain management placed, longterm use of oxycodone medication  Prescribed oxycodone-acetaminophen 5-325 mg   Orders: -     Ambulatory referral to [facility] Take 1 tablet by mouth every 6 (six) hours as needed for up to 5 days.  Dispense: 20 tablet; Refill: 0  Primary hypertension -     CMP14+EGFR -     Microalbumin / creatinine urine  ratio  Hyperlipidemia, unspecified hyperlipidemia type -     CBC with Differential/Platelet -     Lipid panel  IFG (impaired fasting glucose) -     Hemoglobin A1c  Hypothyroidism, unspecified type -     TSH + free T4  Need for hepatitis C screening test -     Hepatitis C antibody  Encounter for screening colonoscopy -     Ambulatory referral to Gastroenterology  Screening for diabetic retinopathy -     Ambulatory referral to Ophthalmology  SOB (shortness of breath) -     Albuterol Sulfate HFA; Inhale 2 puffs into the lungs every 6 (six) hours as needed for wheezing or shortness of breath.  Dispense: 8 g; Refill: 2  Vaccine counseling -     Pneumococcal conjugate vaccine 20-valent  Encounter for routine adult physical exam with abnormal findings Assessment & Plan: Physical exam done, labs ordered  Updated screening and health maintenance  BMI 26.04 Exercise and nutrition counseling Referral to pain management - longterm use of oxycodone medication  Explained to patient to follow a DASH diet which includes vegetables,fruits,whole grains, fat free or low fat diary,fish,poultry,beans,nuts and seeds,vegetable oils. Find an activity that you will enjoy and start to be active at least 5 days a week for 30 minutes each day.  [place] Office Visit from 03/01/2022 in [facility]  PHQ-9 Total Score 11     Discussed non pharmacological interventions such seeing a therapist, support system, diet, exercise, sleep. Follow up in 3 months or sooner if  needed. Patient verbalizes understanding regarding plan of care and all questions answered.  Patient refused behavior health services referral      Return in about 3 months (around 05/30/2022) for re-check blood pressure, chronic follow-up, diabetes, hypertension, hyperlipidema .     Renard Hamper Ria Comment, FNP

## 2022-03-01 NOTE — Assessment & Plan Note (Addendum)
Referral to pain management placed, longterm use of oxycodone medication  Prescribed oxycodone-acetaminophen 5-325 mg

## 2022-03-01 NOTE — Patient Instructions (Addendum)
It was pleasure meeting with you today. Please take medications as prescribed. Follow up with your primary health provider if any health concerns arises. Please follow up in 3 months for HTN, Diabetes, Hyperlipemia management  Follow up with pain management referral

## 2022-03-01 NOTE — Assessment & Plan Note (Addendum)
Physical exam done, labs ordered  Updated screening and health maintenance  BMI 26.04 Exercise and nutrition counseling Referral to pain management - longterm use of oxycodone medication  Explained to patient to follow a DASH diet which includes vegetables,fruits,whole grains, fat free or low fat diary,fish,poultry,beans,nuts and seeds,vegetable oils. Find an activity that you will enjoy and start to be active at least 5 days a week for 30 minutes each day.  [place] Office Visit from 03/01/2022 in [facility]  PHQ-9 Total Score 11     Discussed non pharmacological interventions such seeing a therapist, support system, diet, exercise, sleep. Follow up in 3 months or sooner if needed. Patient verbalizes understanding regarding plan of care and all questions answered.  Patient refused behavior health services referral

## 2022-03-02 ENCOUNTER — Encounter (INDEPENDENT_AMBULATORY_CARE_PROVIDER_SITE_OTHER): Payer: Self-pay | Admitting: *Deleted

## 2022-03-03 LAB — CMP14+EGFR
ALT: 32 IU/L (ref 0–44)
AST: 18 IU/L (ref 0–40)
Albumin/Globulin Ratio: 1.1 — ABNORMAL LOW (ref 1.2–2.2)
Albumin: 4 g/dL (ref 3.9–4.9)
Alkaline Phosphatase: 227 IU/L — ABNORMAL HIGH (ref 44–121)
BUN/Creatinine Ratio: 15 (ref 10–24)
BUN: 36 mg/dL — ABNORMAL HIGH (ref 8–27)
Bilirubin Total: 0.8 mg/dL (ref 0.0–1.2)
CO2: 21 mmol/L (ref 20–29)
Calcium: 9.1 mg/dL (ref 8.6–10.2)
Chloride: 98 mmol/L (ref 96–106)
Creatinine, Ser: 2.48 mg/dL — ABNORMAL HIGH (ref 0.76–1.27)
Globulin, Total: 3.6 g/dL (ref 1.5–4.5)
Glucose: 134 mg/dL — ABNORMAL HIGH (ref 70–99)
Potassium: 3.8 mmol/L (ref 3.5–5.2)
Sodium: 141 mmol/L (ref 134–144)
Total Protein: 7.6 g/dL (ref 6.0–8.5)
eGFR: 28 mL/min/{1.73_m2} — ABNORMAL LOW (ref >59)

## 2022-03-03 LAB — CBC WITH DIFFERENTIAL/PLATELET
Basophils Absolute: 0 10*3/uL (ref 0.0–0.2)
Basos: 0 %
EOS (ABSOLUTE): 0 10*3/uL (ref 0.0–0.4)
Eos: 1 %
Hematocrit: 36.1 % — ABNORMAL LOW (ref 37.5–51.0)
Hemoglobin: 11.9 g/dL — ABNORMAL LOW (ref 13.0–17.7)
Immature Grans (Abs): 0 10*3/uL (ref 0.0–0.1)
Immature Granulocytes: 0 %
Lymphocytes Absolute: 0.8 10*3/uL (ref 0.7–3.1)
Lymphs: 16 %
MCH: 31.9 pg (ref 26.6–33.0)
MCHC: 33 g/dL (ref 31.5–35.7)
MCV: 97 fL (ref 79–97)
Monocytes Absolute: 0.5 10*3/uL (ref 0.1–0.9)
Monocytes: 10 %
Neutrophils Absolute: 3.5 10*3/uL (ref 1.4–7.0)
Neutrophils: 73 %
Platelets: 245 10*3/uL (ref 150–450)
RBC: 3.73 x10E6/uL — ABNORMAL LOW (ref 4.14–5.80)
RDW: 13.1 % (ref 11.6–15.4)
WBC: 4.8 10*3/uL (ref 3.4–10.8)

## 2022-03-03 LAB — LIPID PANEL
Chol/HDL Ratio: 2.4 ratio (ref 0.0–5.0)
Cholesterol, Total: 103 mg/dL (ref 100–199)
HDL: 43 mg/dL (ref >39)
LDL Chol Calc (NIH): 41 mg/dL (ref 0–99)
Triglycerides: 104 mg/dL (ref 0–149)
VLDL Cholesterol Cal: 19 mg/dL (ref 5–40)

## 2022-03-03 LAB — TSH+FREE T4
Free T4: 2.09 ng/dL — ABNORMAL HIGH (ref 0.82–1.77)
TSH: 3.02 u[IU]/mL (ref 0.450–4.500)

## 2022-03-03 LAB — MICROALBUMIN / CREATININE URINE RATIO
Creatinine, Urine: 76.3 mg/dL
Microalb/Creat Ratio: 174 mg/g creat — ABNORMAL HIGH (ref 0–29)
Microalbumin, Urine: 132.5 ug/mL

## 2022-03-03 LAB — HEMOGLOBIN A1C
Est. average glucose Bld gHb Est-mCnc: 140 mg/dL
Hgb A1c MFr Bld: 6.5 % — ABNORMAL HIGH (ref 4.8–5.6)

## 2022-03-03 LAB — HEPATITIS C ANTIBODY: Hep C Virus Ab: NONREACTIVE

## 2022-03-10 ENCOUNTER — Ambulatory Visit: Payer: Medicare HMO

## 2022-03-12 ENCOUNTER — Telehealth: Payer: Self-pay

## 2022-03-12 NOTE — Telephone Encounter (Signed)
Pt called with c/o severe pain of RLE. He has been taking 7 day course of Keflex and completed it earlier this week, and the pain began shortly after he finished it. Pt denies any drainage. He has intermittent swelling of BLE. Per APP, pt is to report to ED. Pt was advised of this and stated he may go later but does not want to go there yet.

## 2022-03-17 ENCOUNTER — Ambulatory Visit: Payer: Medicare HMO

## 2022-03-17 NOTE — Progress Notes (Deleted)
  POST OPERATIVE OFFICE NOTE    CC:  F/u for surgery  HPI:  This is a 67 y.o. male who is s/p right lower extremity thromboembolectomy via below knee popliteal approach, right TPT endarterectomy with vein patch angioplasty on 02/08/2022 by Dr. Donzetta Matters for acute RLE ischemia and sensory loss.    Pt was seen on 02/24/2022 and at that time, he was having increased pain and edema in both legs.  He had weeping clear fluid from the right BK incision.  He had a palpable DP pulse and doppler signal in the PT with motor in tact.   Every other staple was removed and he was placed in mild compression and prescribed Keflex.  He was on asa/Eliquis and statin.  Pt returns today for follow up.  Pt states ***   Allergies  Allergen Reactions   Bee Pollen Swelling    Current Outpatient Medications  Medication Sig Dispense Refill   albuterol (VENTOLIN HFA) 108 (90 Base) MCG/ACT inhaler Inhale 2 puffs into the lungs every 6 (six) hours as needed for wheezing or shortness of breath. 8 g 2   amiodarone (PACERONE) 200 MG tablet Take 0.5 tablets (100 mg total) by mouth daily. 45 tablet 3   apixaban (ELIQUIS) 5 MG TABS tablet Take 1 tablet (5 mg total) by mouth 2 (two) times daily. 60 tablet 0   aspirin EC 81 MG tablet Take 1 tablet (81 mg total) by mouth daily at 6 (six) AM. Swallow whole. 30 tablet 12   cephALEXin (KEFLEX) 500 MG capsule Take 1 capsule (500 mg total) by mouth 3 (three) times daily. 21 capsule 1   dapagliflozin propanediol (FARXIGA) 10 MG TABS tablet Take 1 tablet (10 mg total) by mouth daily. 30 tablet 11   levothyroxine (SYNTHROID) 137 MCG tablet Take 137 mcg by mouth every morning.     Magnesium 250 MG TABS Take 250 mg by mouth daily.     metoprolol succinate (TOPROL-XL) 50 MG 24 hr tablet Take 1 tablet (50 mg total) by mouth daily. Take with or immediately following a meal. 30 tablet 0   Oxycodone HCl 10 MG TABS Take 10 mg by mouth 3 (three) times daily.     pantoprazole (PROTONIX) 40 MG tablet  Take 1 tablet (40 mg total) by mouth daily. 30 tablet 0   polyethylene glycol powder (GLYCOLAX/MIRALAX) 17 GM/SCOOP powder Take 17 g by mouth daily. 500 g 0   rosuvastatin (CRESTOR) 20 MG tablet Take 1 tablet (20 mg total) by mouth daily. 30 tablet 0   senna-docusate (SENOKOT-S) 8.6-50 MG tablet Take 1 tablet by mouth at bedtime as needed for mild constipation. 30 tablet 0   Study - DAPA TIMI 68 - dapagliflozin (FARXIGA) 10 mg or placebo tablet (PI-Dalton McLean) Take by mouth.     torsemide (DEMADEX) 20 MG tablet Take 1 tablet (20 mg total) by mouth daily as needed (fluid). 30 tablet 1   No current facility-administered medications for this visit.     ROS:  See HPI  Physical Exam:  ***  Incision:  *** Extremities:  *** Neuro: *** Abdomen:  ***    Assessment/Plan:  This is a 67 y.o. male who is s/p: right lower extremity thromboembolectomy via below knee popliteal approach, right TPT endarterectomy with vein patch angioplasty on 02/08/2022 by Dr. Donzetta Matters for acute RLE ischemia and sensory loss.    -*** -continue asa/statin/Eliquis   Leontine Locket, [facility] [phone]   Clinic MD:  Donzetta Matters

## 2022-03-18 ENCOUNTER — Ambulatory Visit (INDEPENDENT_AMBULATORY_CARE_PROVIDER_SITE_OTHER): Payer: Medicare HMO | Admitting: Physician Assistant

## 2022-03-18 VITALS — BP 107/64 | HR 82 | Temp 97.2°F | Resp 20 | Ht 72.0 in | Wt 178.7 lb

## 2022-03-18 DIAGNOSIS — I998 Other disorder of circulatory system: Secondary | ICD-10-CM

## 2022-03-18 MED ORDER — OXYCODONE HCL 10 MG PO TABS: 10.0000 mg | ORAL_TABLET | Freq: Three times a day (TID) | ORAL | 0 refills | Status: DC

## 2022-03-18 NOTE — Progress Notes (Signed)
POST OPERATIVE OFFICE NOTE    CC:  F/u for surgery  HPI:  This is a 67 y.o. male who is s/p right leg thrombectomy via below the knee popliteal approach with right TP trunk endarterectomy and vein patch angioplasty by Dr. 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  He returns to clinic for wound check and removal of the remainder of staples at the below the knee popliteal incision.  He continues to have numbness in his foot however this has been stable since his ischemic event.  He also continues to have edema of the right leg.  He was educated on elevation of his legs and provided compression with Ace wrap's at his last office visit however admittedly has not done much of this.  He he continues to take his aspirin, Eliquis, and statin daily.  He continues to smoke daily.  Allergies  Allergen Reactions   Bee Pollen Swelling    Current Outpatient Medications  Medication Sig Dispense Refill   albuterol (VENTOLIN HFA) 108 (90 Base) MCG/ACT inhaler Inhale 2 puffs into the lungs every 6 (six) hours as needed for wheezing or shortness of breath. 8 g 2   amiodarone (PACERONE) 200 MG tablet Take 0.5 tablets (100 mg total) by mouth daily. 45 tablet 3   apixaban (ELIQUIS) 5 MG TABS tablet Take 1 tablet (5 mg total) by mouth 2 (two) times daily. 60 tablet 0   aspirin EC 81 MG tablet Take 1 tablet (81 mg total) by mouth daily at 6 (six) AM. Swallow whole. 30 tablet 12   cephALEXin (KEFLEX) 500 MG capsule Take 1 capsule (500 mg total) by mouth 3 (three) times daily. 21 capsule 1   dapagliflozin propanediol (FARXIGA) 10 MG TABS tablet Take 1 tablet (10 mg total) by mouth daily. 30 tablet 11   levothyroxine (SYNTHROID) 137 MCG tablet Take 137 mcg by mouth every morning.     Magnesium 250 MG TABS Take 250 mg by mouth daily.     metoprolol succinate (TOPROL-XL) 50 MG 24 hr tablet Take 1 tablet (50 mg total) by mouth daily. Take with or  immediately following a meal. 30 tablet 0   Oxycodone HCl 10 MG TABS Take 10 mg by mouth 3 (three) times daily.     pantoprazole (PROTONIX) 40 MG tablet Take 1 tablet (40 mg total) by mouth daily. 30 tablet 0   polyethylene glycol powder (GLYCOLAX/MIRALAX) 17 GM/SCOOP powder Take 17 g by mouth daily. 500 g 0   rosuvastatin (CRESTOR) 20 MG tablet Take 1 tablet (20 mg total) by mouth daily. 30 tablet 0   senna-docusate (SENOKOT-S) 8.6-50 MG tablet Take 1 tablet by mouth at bedtime as needed for mild constipation. 30 tablet 0   Study - DAPA TIMI 68 - dapagliflozin (FARXIGA) 10 mg or placebo tablet (PI-Dalton McLean) Take by mouth.     torsemide (DEMADEX) 20 MG tablet Take 1 tablet (20 mg total) by mouth daily as needed (fluid). 30 tablet 1   No current facility-administered medications for this visit.     ROS:  See HPI  Physical Exam:  Vitals:   03/18/22 1104  BP: 107/64  Pulse: 82  Resp: 20  Temp: (!) 97.2 F (36.2 C)  TempSrc: Temporal  SpO2: 97%  Weight: 178 lb 11.2 oz (81.1 kg)  Height: 6' (1.829 m)    Incision: Below the knee popliteal incision healing well Extremities: Brisk DP and PT signal by Doppler  multiphasic Neuro: A&O  Assessment/Plan:  This is a 67 y.o. male who is s/p: Right lower extremity thrombectomy with TP trunk endarterectomy and vein patch angioplasty  -Right foot well-perfused based on physical exam and Doppler exam -Remainder of staples removed and Steri-Strips were applied to the below the knee popliteal incision -I discussed with the patient that right foot numbness will likely be permanent however we will allow more time to see if he regains any sensation -He will need to continue his anticoagulation as well as aspirin and statin daily -Encouraged smoking cessation -Encouraged elevation of the leg when not moving around during the day.  He can also wrap an Ace wrap from his toes to his knee to provide compression -I will prescribe him #10 more  oxycodone tablets.  He was made aware that this will be his last refill for postoperative pain control -We will check a right leg arterial duplex and ABI in a couple months   Dagoberto Ligas, PA-C [facility] [phone]  Clinic MD:  Scot Dock

## 2022-03-19 ENCOUNTER — Other Ambulatory Visit: Payer: Self-pay

## 2022-03-19 DIAGNOSIS — I998 Other disorder of circulatory system: Secondary | ICD-10-CM

## 2022-03-23 ENCOUNTER — Other Ambulatory Visit: Payer: Self-pay | Admitting: Family Medicine

## 2022-03-23 DIAGNOSIS — N184 Chronic kidney disease, stage 4 (severe): Secondary | ICD-10-CM

## 2022-03-31 ENCOUNTER — Other Ambulatory Visit: Payer: Self-pay | Admitting: Family Medicine

## 2022-03-31 ENCOUNTER — Ambulatory Visit: Payer: Medicare HMO | Admitting: Internal Medicine

## 2022-03-31 DIAGNOSIS — R748 Abnormal levels of other serum enzymes: Secondary | ICD-10-CM

## 2022-04-06 ENCOUNTER — Other Ambulatory Visit: Payer: Self-pay

## 2022-04-06 ENCOUNTER — Encounter (HOSPITAL_COMMUNITY): Payer: Self-pay | Admitting: Emergency Medicine

## 2022-04-06 ENCOUNTER — Emergency Department (HOSPITAL_COMMUNITY)
Admission: EM | Admit: 2022-04-06 | Discharge: 2022-04-06 | Disposition: A | Discharge status: Home / Self Care | Payer: Medicare HMO | Attending: Emergency Medicine | Admitting: Emergency Medicine

## 2022-04-06 ENCOUNTER — Emergency Department (HOSPITAL_COMMUNITY): Payer: Medicare HMO

## 2022-04-06 DIAGNOSIS — Z7984 Long term (current) use of oral hypoglycemic drugs: Secondary | ICD-10-CM | POA: Diagnosis not present

## 2022-04-06 DIAGNOSIS — I110 Hypertensive heart disease with heart failure: Secondary | ICD-10-CM | POA: Insufficient documentation

## 2022-04-06 DIAGNOSIS — M25561 Pain in right knee: Secondary | ICD-10-CM | POA: Diagnosis present

## 2022-04-06 DIAGNOSIS — I509 Heart failure, unspecified: Secondary | ICD-10-CM | POA: Insufficient documentation

## 2022-04-06 DIAGNOSIS — M25461 Effusion, right knee: Secondary | ICD-10-CM | POA: Diagnosis not present

## 2022-04-06 DIAGNOSIS — Z7982 Long term (current) use of aspirin: Secondary | ICD-10-CM | POA: Insufficient documentation

## 2022-04-06 DIAGNOSIS — Z7901 Long term (current) use of anticoagulants: Secondary | ICD-10-CM | POA: Diagnosis not present

## 2022-04-06 DIAGNOSIS — Z79899 Other long term (current) drug therapy: Secondary | ICD-10-CM | POA: Insufficient documentation

## 2022-04-06 DIAGNOSIS — E119 Type 2 diabetes mellitus without complications: Secondary | ICD-10-CM | POA: Insufficient documentation

## 2022-04-06 LAB — SYNOVIAL CELL COUNT + DIFF, W/ CRYSTALS
Eosinophils-Synovial: 0 % (ref 0–1)
Lymphocytes-Synovial Fld: 1 % (ref 0–20)
Monocyte-Macrophage-Synovial Fluid: 1 % — ABNORMAL LOW (ref 50–90)
Neutrophil, Synovial: 98 % — ABNORMAL HIGH (ref 0–25)
WBC, Synovial: 12900 /mm3 — ABNORMAL HIGH (ref 0–200)

## 2022-04-06 MED ORDER — LIDOCAINE-EPINEPHRINE (PF) 2 %-1:200000 IJ SOLN
20.0000 mL | Freq: Once | INTRAMUSCULAR | Status: AC
  Administered 2022-04-06: 20 mL
  Filled 2022-04-06: qty 20

## 2022-04-06 MED ORDER — LIDOCAINE HCL (PF) 2 % IJ SOLN
INTRAMUSCULAR | Status: AC
  Filled 2022-04-06: qty 5

## 2022-04-06 MED ORDER — KETOROLAC TROMETHAMINE 15 MG/ML IJ SOLN: 15.0000 mg | Freq: Once | INTRAMUSCULAR | Status: DC

## 2022-04-06 NOTE — Discharge Instructions (Signed)
You were evaluated today for right knee pain and swelling. Your initial joint fluid analysis is consistent with inflammatory arthritis. You may try Voltaren Gel  over the affected knee for relief. You should also follow up with orthopedics for further evaluation and management of your right knee pain. Contact information is attached.

## 2022-04-06 NOTE — ED Notes (Signed)
Pt up with walker and doing well.  Understands to call ortho for appt.

## 2022-04-06 NOTE — ED Notes (Signed)
Pt up sitting in chair. Ambs well with walker

## 2022-04-06 NOTE — ED Triage Notes (Signed)
Pt had blood clot removed out of right leg 5-6 weeks ago. Pt reports he has completed all follow-up care. He is here for swelling and pain in that knee x 3 days.

## 2022-04-06 NOTE — ED Notes (Signed)
Pt care taken, awaiting knee aspiration, no complaints at this time.

## 2022-04-06 NOTE — ED Notes (Signed)
Pt noted to have some sleep apea   sat goes to 80's when sleeping    when awaken go back up to around 96

## 2022-04-06 NOTE — ED Notes (Signed)
Knee aspiriate done by PA   pt tol well

## 2022-04-06 NOTE — ED Provider Notes (Signed)
[place] Provider Note   CSN: [id number] Arrival date & time: 04/06/22  0093     History  Chief Complaint  Patient presents with   Knee Pain    [name] [name] is a 67 y.o. male.  The patient presents to the emergency department complaining of right-sided knee pain and swelling.  He states this has been ongoing for the past 3 days.  He does endorse having a blood clot removed out of the right leg 5 to 6 weeks ago and states that he has completed all of his follow-up care at this time.  He denies any known injury to the area, denies fevers, nausea, vomiting.  Endorses swelling of the knee.  He does state that he has a history of arthritis in that knee.  Past medical history otherwise significant for CHF, hypertension, type II DM, fluid retention in legs, anxiety  HPI     Home Medications Prior to Admission medications   Medication Sig Start Date End Date Taking? Authorizing Provider  albuterol (VENTOLIN HFA) 108 (90 Base) MCG/ACT inhaler Inhale 2 puffs into the lungs every 6 (six) hours as needed for wheezing or shortness of breath. 03/01/22  Yes [place] Comment, Lamar Benes, FNP  amiodarone (PACERONE) 200 MG tablet Take 0.5 tablets (100 mg total) by mouth daily. 06/01/21  Yes Larey Dresser, MD  apixaban (ELIQUIS) 5 MG TABS tablet Take 1 tablet (5 mg total) by mouth 2 (two) times daily. 02/10/22  Yes Precious Gilding, DO  aspirin EC 81 MG tablet Take 1 tablet (81 mg total) by mouth daily at 6 (six) AM. Swallow whole. 02/11/22  Yes August Albino, MD  dapagliflozin propanediol (FARXIGA) 10 MG TABS tablet Take 1 tablet (10 mg total) by mouth daily. 02/11/22  Yes McDiarmid, Blane Ohara, MD  levothyroxine (SYNTHROID) 137 MCG tablet Take 137 mcg by mouth every morning. 11/09/20  Yes [provider]  Magnesium 250 MG TABS Take 250 mg by mouth daily.   Yes [provider]  metoprolol succinate (TOPROL-XL) 50 MG 24 hr tablet Take 1 tablet (50  mg total) by mouth daily. Take with or immediately following a meal. 02/11/22  Yes Precious Gilding, DO  Oxycodone HCl 10 MG TABS Take 1 tablet (10 mg total) by mouth 3 (three) times daily. 03/18/22  Yes Dagoberto Ligas, PA-C  pantoprazole (PROTONIX) 40 MG tablet Take 1 tablet (40 mg total) by mouth daily. 02/11/22  Yes Precious Gilding, DO  rosuvastatin (CRESTOR) 20 MG tablet Take 1 tablet (20 mg total) by mouth daily. 02/11/22  Yes Precious Gilding, DO  senna-docusate (SENOKOT-S) 8.6-50 MG tablet Take 1 tablet by mouth at bedtime as needed for mild constipation. 02/10/22  Yes Precious Gilding, DO  torsemide (DEMADEX) 20 MG tablet Take 1 tablet (20 mg total) by mouth daily as needed (fluid). 10/19/21  Yes Johnson, Clanford L, MD  cephALEXin (KEFLEX) 500 MG capsule Take 1 capsule (500 mg total) by mouth 3 (three) times daily. Patient not taking: Reported on 04/06/2022 02/24/22   Ulyses Amor, PA-C  polyethylene glycol powder (GLYCOLAX/MIRALAX) 17 GM/SCOOP powder Take 17 g by mouth daily. Patient not taking: Reported on 04/06/2022 02/10/22   Precious Gilding, DO      Allergies    Bee pollen    Review of Systems   Review of Systems  Musculoskeletal:  Positive for arthralgias and joint swelling.    Physical Exam Updated Vital Signs BP 109/61   Pulse (!) 53  Temp 98.3 F (36.8 C)   Resp 18   Ht 6' (1.829 m)   Wt 81.6 kg   SpO2 96%   BMI 24.41 kg/m  Physical Exam HENT:     Head: Normocephalic and atraumatic.  Eyes:     Pupils: Pupils are equal, round, and reactive to light.  Pulmonary:     Effort: Pulmonary effort is normal. No respiratory distress.  Musculoskeletal:        General: Swelling and tenderness present. No signs of injury.     Cervical back: Normal range of motion.     Comments: Large ballotable effusion noted, right knee. Right knee with pain to palpation along the medial and lateral joint lines  Skin:    General: Skin is dry.  Neurological:     Mental Status: He is alert.  Psychiatric:         Speech: Speech normal.        Behavior: Behavior normal.     ED Results / Procedures / Treatments   Labs (all labs ordered are listed, but only abnormal results are displayed) Labs Reviewed  SYNOVIAL CELL COUNT + DIFF, W/ CRYSTALS - Abnormal; Notable for the following components:      Result Value   Appearance-Synovial CLOUDY (*)    WBC, Synovial 12,900 (*)    Neutrophil, Synovial 98 (*)    Monocyte-Macrophage-Synovial Fluid 1 (*)    All other components within normal limits  BODY FLUID CULTURE W GRAM STAIN  GLUCOSE, BODY FLUID OTHER            PROTEIN, BODY FLUID (OTHER)    EKG None  Radiology DG Knee Complete 4 Views Right  Result Date: 04/06/2022 CLINICAL DATA:  Right knee pain, swelling EXAM: RIGHT KNEE - COMPLETE 4+ VIEW COMPARISON:  None Available. FINDINGS: Large joint effusion. No evidence of acute fracture or joint malalignment. Remote fibular fracture. Calcific atherosclerosis. IMPRESSION: 1. Large joint effusion. Consider cross-sectional imaging for further evaluation. 2. No evidence of acute fracture or joint malalignment. Electronically Signed   By: Margaretha Sheffield M.D.   On: 04/06/2022 10:45    Procedures .Joint Aspiration/Arthrocentesis  Date/Time: 04/06/2022 1:02 PM  Performed by: Dorothyann Peng, PA-C Authorized by: Dorothyann Peng, PA-C   Consent:    Consent obtained:  Verbal   Consent given by:  Patient   Risks discussed:  Bleeding, infection, pain and incomplete drainage   Alternatives discussed:  No treatment and alternative treatment Universal protocol:    Procedure explained and questions answered to patient or proxy's satisfaction: yes     Relevant documents present and verified: yes     Required blood products, implants, devices, and special equipment available: yes     Site/side marked: yes     Immediately prior to procedure, a time out was called: yes     Patient identity confirmed:  Verbally with patient and arm band Location:     Location:  Knee   Knee:  R knee Anesthesia:    Anesthesia method:  Local infiltration   Local anesthetic:  Lidocaine 2% WITH epi Procedure details:    Preparation: Patient was prepped and draped in usual sterile fashion     Needle gauge:  18 G   Ultrasound guidance: yes     Approach:  Lateral   Aspirate amount:  90   Aspirate characteristics:  Yellow, cloudy and blood-tinged   Steroid injected: no     Specimen collected: yes   Post-procedure details:  Dressing:  Adhesive bandage     Medications Ordered in ED Medications  lidocaine HCl (PF) (XYLOCAINE) 2 % injection (has no administration in time range)  lidocaine-EPINEPHrine (XYLOCAINE W/EPI) 2 %-1:200000 (PF) injection 20 mL (20 mLs Other Given by Other 04/06/22 1305)    ED Course/ Medical Decision Making/ A&P                             Medical Decision Making Amount and/or Complexity of Data Reviewed Labs: ordered. Radiology: ordered.  Risk Prescription drug management.   Patient presents emergency room with a chief complaint of right knee pain with swelling.  Differential diagnosis includes joint effusion, hemarthrosis, osteoarthritis, fracture, dislocation, and others  I reviewed the patient's past medical history including recent hospital admission due to ischemic leg with subsequent surgery including right lower thrombectomy with vein patch, right lower saphenous vein harvest, peroneal endarterectomy  I ordered and interpreted imaging including plain films of the right knee.  Large joint effusion noted.  Remote fibular fracture noted.  I agree with radiologist findings  I ordered and reviewed labs.  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  Plan to discharge patient home with recommendations for voltaren gel and recommended follow-up with orthopedics for further evaluation and  management.         Final Clinical Impression(s) / ED Diagnoses Final diagnoses:  Acute pain of right knee  Effusion of right knee    Rx / DC Orders ED Discharge Orders     None         Ronny Bacon 04/06/22 1621    Cristie Hem, MD 04/06/22 1925

## 2022-04-07 ENCOUNTER — Other Ambulatory Visit: Payer: Self-pay | Admitting: Family Medicine

## 2022-04-07 ENCOUNTER — Ambulatory Visit (INDEPENDENT_AMBULATORY_CARE_PROVIDER_SITE_OTHER): Payer: Medicare HMO | Admitting: Family Medicine

## 2022-04-07 ENCOUNTER — Encounter: Payer: Self-pay | Admitting: Family Medicine

## 2022-04-07 VITALS — Ht 72.0 in | Wt 180.0 lb

## 2022-04-07 DIAGNOSIS — R748 Abnormal levels of other serum enzymes: Secondary | ICD-10-CM

## 2022-04-07 DIAGNOSIS — Z0000 Encounter for general adult medical examination without abnormal findings: Secondary | ICD-10-CM | POA: Diagnosis not present

## 2022-04-07 LAB — PROTEIN, BODY FLUID (OTHER): Total Protein, Body Fluid Other: 3.6 g/dL

## 2022-04-07 LAB — GLUCOSE, BODY FLUID OTHER: Glucose, Body Fluid Other: 61 mg/dL

## 2022-04-07 NOTE — Progress Notes (Signed)
Subjective:   [name] [name] is a 67 y.o. male who presents for Medicare Annual/Subsequent preventive examination. I connected with  [name] on 04/07/22 by a audio enabled telemedicine application and verified that I am speaking with the correct person using two identifiers.  Patient Location: Home  Provider Location: Office/Clinic  I discussed the limitations of evaluation and management by telemedicine. The patient expressed understanding and agreed to proceed.  Review of Systems    Patient denies pain, fever, chills, chest pain, palpations ,shortness of breath, blurred vision,cough, abdominal pain, nausea, vomiting, headache, dizziness. Patient is not feeling nervous or anxious   Cardiac Risk Factors include: advanced age (>46men, >75 women)     Objective:    Today's Vitals   04/07/22 1336  Weight: 180 lb (81.6 kg)  Height: 6' (1.829 m)   Body mass index is 24.41 kg/m.     04/07/2022    1:42 PM 04/06/2022   10:42 AM 02/08/2022    8:08 AM 02/08/2022    7:52 AM 10/17/2021    3:12 PM 09/14/2021    7:09 AM 01/07/2021    9:24 AM  Advanced Directives  Does Patient Have a Medical Advance Directive? No No No No No No No  Would patient like information on creating a medical advance directive? No - Patient declined No - Patient declined No - Patient declined  No - Patient declined No - Patient declined No - Patient declined    Current Medications (verified) Outpatient Encounter Medications as of 04/07/2022  Medication Sig   albuterol (VENTOLIN HFA) 108 (90 Base) MCG/ACT inhaler Inhale 2 puffs into the lungs every 6 (six) hours as needed for wheezing or shortness of breath.   amiodarone (PACERONE) 200 MG tablet Take 0.5 tablets (100 mg total) by mouth daily.   apixaban (ELIQUIS) 5 MG TABS tablet Take 1 tablet (5 mg total) by mouth 2 (two) times daily.   aspirin EC 81 MG tablet Take 1 tablet (81 mg total) by mouth daily at 6 (six) AM. Swallow whole.   cephALEXin (KEFLEX)  500 MG capsule Take 1 capsule (500 mg total) by mouth 3 (three) times daily.   dapagliflozin propanediol (FARXIGA) 10 MG TABS tablet Take 1 tablet (10 mg total) by mouth daily.   levothyroxine (SYNTHROID) 137 MCG tablet Take 137 mcg by mouth every morning.   Magnesium 250 MG TABS Take 250 mg by mouth daily.   metoprolol succinate (TOPROL-XL) 50 MG 24 hr tablet Take 1 tablet (50 mg total) by mouth daily. Take with or immediately following a meal.   Oxycodone HCl 10 MG TABS Take 1 tablet (10 mg total) by mouth 3 (three) times daily.   pantoprazole (PROTONIX) 40 MG tablet Take 1 tablet (40 mg total) by mouth daily.   polyethylene glycol powder (GLYCOLAX/MIRALAX) 17 GM/SCOOP powder Take 17 g by mouth daily.   rosuvastatin (CRESTOR) 20 MG tablet Take 1 tablet (20 mg total) by mouth daily.   senna-docusate (SENOKOT-S) 8.6-50 MG tablet Take 1 tablet by mouth at bedtime as needed for mild constipation.   torsemide (DEMADEX) 20 MG tablet Take 1 tablet (20 mg total) by mouth daily as needed (fluid).   No facility-administered encounter medications on file as of 04/07/2022.    Allergies (verified) Bee pollen   History: Past Medical History:  Diagnosis Date   Anxiety    CHF (congestive heart failure) ([place])    a. EF 15% in 2013 with cath showing normal cors b. EF 30-35% by  repeat echo in [id number]   Chronic systolic heart failure ([place])    Diabetes mellitus    Fluid retention in legs    Hypertension    Past Surgical History:  Procedure Laterality Date   COLONOSCOPY  01/04/2012   Procedure: COLONOSCOPY;  Surgeon: Danie Binder, MD;  [facility];  Service: Endoscopy;  Laterality: N/A;  1:30 PM   ENDARTERECTOMY POPLITEAL Right 02/08/2022   Procedure: TIBIAL PERONEAL ENDARTERECTOMY;  Surgeon: Waynetta Sandy, MD;  Location: [facility];  Service: Vascular;  Laterality: Right;   HERNIA REPAIR     LEFT AND RIGHT HEART CATHETERIZATION WITH CORONARY ANGIOGRAM N/A 07/09/2011   Procedure: LEFT  AND RIGHT HEART CATHETERIZATION WITH CORONARY ANGIOGRAM;  Surgeon: Peter M Martinique, MD;  Location: [facility] CATH LAB;  Service: Cardiovascular;  Laterality: N/A;   RIGHT/LEFT HEART CATH AND CORONARY ANGIOGRAPHY N/A 02/25/2020   Procedure: RIGHT/LEFT HEART CATH AND CORONARY ANGIOGRAPHY;  Surgeon: Larey Dresser, MD;  Location: [facility] CV LAB;  Service: Cardiovascular;  Laterality: N/A;   THROMBECTOMY FEMORAL ARTERY Right 02/08/2022   Procedure: RIGHT LOWER THROMBECTOMY WITH VEIN PATCH;  Surgeon: Waynetta Sandy, MD;  Location: [facility];  Service: Vascular;  Laterality: Right;   VEIN HARVEST Right 02/08/2022   Procedure: RIGHT LOWER SAPHENOUS VEIN HARVEST;  Surgeon: Waynetta Sandy, MD;  Location: [facility];  Service: Vascular;  Laterality: Right;   Family History  Problem Relation Age of Onset   Heart attack Father    Heart failure Father    Cancer Mother        Multiple myeloma   Heart disease Sister    Social History   Socioeconomic History   Marital status: Married    Spouse name: Not on file   Number of children: 0   Years of education: Not on file   Highest education level: Not on file  Occupational History   Occupation: disabled  Tobacco Use   Smoking status: Some Days    Packs/day: .25    Types: Cigarettes, E-cigarettes    Start date: 10/13/1982    Last attempt to quit: 09/19/2011    Years since quitting: 10.5    Passive exposure: Never   Smokeless tobacco: Never   Tobacco comments:    electronic cig for 4 months   Vaping Use   Vaping Use: Never used  Substance and Sexual Activity   Alcohol use: Not Currently    Comment: weekends   Drug use: No   Sexual activity: Not on file  Other Topics Concern   Not on file  Social History Narrative   Patient is right handed.   Patient seldom drinks caffeine.   Social Determinants of Health   Financial Resource Strain: Medium Risk (04/07/2022)   Overall Financial Resource Strain (CARDIA)    Difficulty of Paying  Living Expenses: Somewhat hard  Food Insecurity: No Food Insecurity (04/07/2022)   Hunger Vital Sign    Worried About Running Out of Food in the Last Year: Never true    Ran Out of Food in the Last Year: Never true  Transportation Needs: No Transportation Needs (04/07/2022)   PRAPARE - Hydrologist (Medical): No    Lack of Transportation (Non-Medical): No  Physical Activity: Inactive (04/07/2022)   Exercise Vital Sign    Days of Exercise per Week: 0 days    Minutes of Exercise per Session: 0 min  Stress: No Stress Concern Present (04/07/2022)   [employer] of  Occupational Health - Occupational Stress Questionnaire    Feeling of Stress : Not at all  Social Connections: Moderately Isolated (04/07/2022)   Social Connection and Isolation Panel [NHANES]    Frequency of Communication with Friends and Family: Twice a week    Frequency of Social Gatherings with Friends and Family: Never    Attends Religious Services: 1 to 4 times per year    Active Member of [employer] or Organizations: No    Attends Music therapist: Never    Marital Status: Married    Tobacco Counseling Ready to quit: No Counseling given: Not Answered Tobacco comments: electronic cig for 4 months    Clinical Intake:  Pre-visit preparation completed: No  Pain : 0-10 Pain Type: Chronic pain Pain Location: Back     BMI - recorded: 24.41 Nutritional Status: BMI of 19-24  Normal Diabetes: Yes CBG done?: No Did pt. bring in CBG monitor from home?: No  How often do you need to have someone help you when you read instructions, pamphlets, or other written materials from your doctor or pharmacy?: 1 - Never  Diabetic? Yes  Interpreter Needed?: No      Activities of Daily Living    04/07/2022    1:42 PM 10/17/2021    3:12 PM  In your present state of health, do you have any difficulty performing the following activities:  Hearing? 0 0  Vision? 0 0  Difficulty  concentrating or making decisions? 1 0  Walking or climbing stairs? 1 0  Dressing or bathing? 1 0  Doing errands, shopping? 1 0  Preparing Food and eating ? N   Using the Toilet? N   In the past six months, have you accidently leaked urine? N   Do you have problems with loss of bowel control? N   Managing your Medications? N   Managing your Finances? N   Housekeeping or managing your Housekeeping? Y     Patient Care Team: Del Ria Comment, Lamar Benes, FNP as PCP - General (Family Medicine) Harl Bowie, Alphonse Guild, MD as PCP - Cardiology (Cardiology) Larey Dresser, MD as PCP - Advanced Heart Failure (Cardiology) Jovita Gamma, MD as Consulting Physician (Neurosurgery) Sanjuana Kava, MD as Consulting Physician (Orthopedic Surgery)  Indicate any recent [place] you may have received from other than [facility] providers in the past year (date may be approximate).     Assessment:   This is a routine wellness examination for [place].  Hearing/Vision screen No results found.  Dietary issues and exercise activities discussed: Discussed to follow a DASH diet which includes vegetables,fruits,whole grains, fat free or low fat diary,fish,poultry,beans,nuts and seeds,vegetable oils. Find an activity that you will enjoy and start to be active at least 5 days a week for 30 minutes each day.      Goals Addressed   None    Depression Screen    03/01/2022    1:14 PM  PHQ 2/9 Scores  PHQ - 2 Score 3  PHQ- 9 Score 11    Fall Risk    04/07/2022    1:42 PM 03/01/2022    1:13 PM  [place] in the past year? 0 0  Number falls in past yr: 0 0  Injury with Fall? 0 0  Risk for fall due to : No Fall Risks No Fall Risks  Follow up Falls evaluation completed Falls evaluation completed    [place]:  Any stairs in or around  the home? No  If so, are there any without handrails? No  Home free of loose throw rugs in walkways, pet beds, electrical  cords, etc? Yes  Adequate lighting in your home to reduce risk of falls? Yes   ASSISTIVE DEVICES UTILIZED TO PREVENT FALLS:  Life alert? No  Use of a cane, walker or w/c? Yes  Grab bars in the bathroom? No  Shower chair or bench in shower? No  Elevated toilet seat or a handicapped toilet? No    Cognitive Function:        04/07/2022    1:43 PM  6CIT Screen  What Year? 0 points  What month? 0 points  What time? 0 points  Count back from 20 0 points  Months in reverse 0 points  Repeat phrase 0 points  Total Score 0 points    Immunizations Immunization History  Administered Date(s) Administered   Influenza,inj,Quad PF,6+ Mos 11/02/2013   PNEUMOCOCCAL CONJUGATE-20 03/01/2022   Pfizer Covid-19 Vaccine Bivalent Booster 43yrs & up 08/02/2019, 08/30/2019    TDAP status: Due, Education has been provided regarding the importance of this vaccine. Advised may receive this vaccine at local pharmacy or Health Dept. Aware to provide a copy of the vaccination record if obtained from local pharmacy or Health Dept. Verbalized acceptance and understanding.  Flu Vaccine status: Due, Education has been provided regarding the importance of this vaccine. Advised may receive this vaccine at local pharmacy or Health Dept. Aware to provide a copy of the vaccination record if obtained from local pharmacy or Health Dept. Verbalized acceptance and understanding.  Pneumococcal vaccine status: Up to date  Covid-19 vaccine status: Information provided on how to obtain vaccines.   Qualifies for Shingles Vaccine? Yes   Zostavax completed No   Shingrix Completed?: No.    Education has been provided regarding the importance of this vaccine. Patient has been advised to call insurance company to determine out of pocket expense if they have not yet received this vaccine. Advised may also receive vaccine at local pharmacy or Health Dept. Verbalized acceptance and understanding.  Screening Tests Health  Maintenance  Topic Date Due   Medicare Annual Wellness (AWV)  Never done   DTaP/Tdap/Td (1 - Tdap) Never done   Zoster Vaccines- Shingrix (1 of 2) Never done   OPHTHALMOLOGY EXAM  08/14/2015   COLONOSCOPY (Pts 45-13yrs Insurance coverage will need to be confirmed)  01/03/2022   INFLUENZA VACCINE  04/18/2022 (Originally 08/18/2021)   COVID-19 Vaccine (3 - Pfizer risk series) 04/23/2022 (Originally 09/27/2019)   HEMOGLOBIN A1C  08/30/2022   Diabetic kidney evaluation - eGFR measurement  03/02/2023   Diabetic kidney evaluation - Urine ACR  03/02/2023   FOOT EXAM  03/02/2023   Pneumonia Vaccine 28+ Years old  Completed   Hepatitis C Screening  Completed   HIV Screening  Completed   HPV VACCINES  Aged Out    Health Maintenance  Health Maintenance Due  Topic Date Due   Medicare Annual Wellness (AWV)  Never done   DTaP/Tdap/Td (1 - Tdap) Never done   Zoster Vaccines- Shingrix (1 of 2) Never done   OPHTHALMOLOGY EXAM  08/14/2015   COLONOSCOPY (Pts 45-58yrs Insurance coverage will need to be confirmed)  01/03/2022    Colorectal cancer screening: Referral to GI placed On 03/01/2022. Pt aware the office will call re: appt.  Lung Cancer Screening: (Low Dose CT Chest recommended if Age 18-80 years, 30 pack-year currently smoking OR have quit w/in 15years.) does not qualify.  Lung Cancer Screening Referral: N/A  Additional Screening:  Hepatitis C Screening: does not qualify; Completed 2/24 Negative  Vision Screening: Recommended annual ophthalmology exams for early detection of glaucoma and other disorders of the eye. Is the patient up to date with their annual eye exam?  No  Who is the provider or what is the name of the office in which the patient attends annual eye exams? Referral placed to opthalmology on 03/01/22 If pt is not established with a provider, would they like to be referred to a provider to establish care? Yes .   Dental Screening: Recommended annual dental exams for proper  oral hygiene  Community Resource Referral / Chronic Care Management: CRR required this visit?  No   CCM required this visit?  No      Plan:     I have personally reviewed and noted the following in the patient's chart:   Medical and social history Use of alcohol, tobacco or illicit drugs  Current medications and supplements including opioid prescriptions. Patient is currently taking opioid prescriptions. Information provided to patient regarding non-opioid alternatives. Patient advised to discuss non-opioid treatment plan with their provider. Functional ability and status Nutritional status Physical activity Advanced directives List of other physicians Hospitalizations, surgeries, and ER visits in previous 12 months Vitals Screenings to include cognitive, depression, and falls Referrals and appointments  In addition, I have reviewed and discussed with patient certain preventive protocols, quality metrics, and best practice recommendations. A written personalized care plan for preventive services as well as general preventive health recommendations were provided to patient.     [place], [place]   04/07/2022

## 2022-04-08 ENCOUNTER — Telehealth: Payer: Self-pay

## 2022-04-08 NOTE — Transitions of Care (Post Inpatient/ED Visit) (Signed)
   04/08/2022  Name: [name] [name] MRN: [id number] DOB: 16-Oct-1955  Today's TOC FU Call Status: Today's TOC FU Call Status:: Successful TOC FU Call Competed TOC FU Call Complete Date: 04/08/22  Transition Care Management Follow-up Telephone Call Date of Discharge: 04/06/22 Discharge Facility: [facility] ([facility] Type of Discharge: Emergency Department Reason for ED Visit: Orthopedic Conditions Orthopedic/Injury Diagnosis:  (Acute pain of right knee) How have you been since you were released from the hospital?: Better Any questions or concerns?: No  Items Reviewed: Did you receive and understand the discharge instructions provided?: Yes Medications obtained and verified?: Yes (Medications Reviewed) Any new allergies since your discharge?: No Dietary orders reviewed?: NA Do you have support at home?: Yes  Home Care and Equipment/Supplies: [place] Ordered?: NA Any new equipment or medical supplies ordered?: NA  Functional Questionnaire: Do you need assistance with bathing/showering or dressing?: No Do you need assistance with meal preparation?: No Do you need assistance with eating?: No Do you have difficulty maintaining continence: No Do you need assistance with getting out of bed/getting out of a chair/moving?: No Do you have difficulty managing or taking your medications?: No  Follow up appointments reviewed: PCP Follow-up appointment confirmed?: No (specialist) MD Provider Line Number:[phone] Given: Yes [facility] Follow-up appointment confirmed?: No Reason Specialist Follow-Up Not Confirmed: Patient has Specialist Provider Number and will Call for Appointment Do you need transportation to your follow-up appointment?: No Do you understand care options if your condition(s) worsen?: Yes-patient verbalized understanding    [place] LPN [place] Direct Dial [phone]

## 2022-04-09 ENCOUNTER — Telehealth: Payer: Self-pay | Admitting: Internal Medicine

## 2022-04-09 LAB — BODY FLUID CULTURE W GRAM STAIN
Culture: NO GROWTH
Gram Stain: NONE SEEN

## 2022-04-09 NOTE — Telephone Encounter (Signed)
The referral in epic now if for elevated alkaline phosphatase, he will need OV for that. He was mailed out a questionnaire on 03/03/22 for TCS referral - he 2 separate referrals for 2 different things

## 2022-04-09 NOTE — Telephone Encounter (Signed)
I called to schedule an appointment but as I got to looking the referral is for a colonoscopy .Marland Kitchen... If you would send him the questionnaire please.

## 2022-04-13 ENCOUNTER — Encounter: Payer: Self-pay | Admitting: Gastroenterology

## 2022-04-16 ENCOUNTER — Other Ambulatory Visit: Payer: Self-pay | Admitting: Family Medicine

## 2022-04-19 ENCOUNTER — Other Ambulatory Visit: Payer: Self-pay

## 2022-04-19 ENCOUNTER — Inpatient Hospital Stay (HOSPITAL_COMMUNITY)
Admission: EM | Admit: 2022-04-19 | Discharge: 2022-04-24 | DRG: 291 | Disposition: A | Discharge status: Home / Self Care | Payer: Medicare HMO | Attending: Internal Medicine | Admitting: Internal Medicine

## 2022-04-19 ENCOUNTER — Emergency Department (HOSPITAL_COMMUNITY): Payer: Medicare HMO

## 2022-04-19 DIAGNOSIS — I739 Peripheral vascular disease, unspecified: Secondary | ICD-10-CM | POA: Diagnosis not present

## 2022-04-19 DIAGNOSIS — E1169 Type 2 diabetes mellitus with other specified complication: Secondary | ICD-10-CM | POA: Diagnosis present

## 2022-04-19 DIAGNOSIS — Z1152 Encounter for screening for COVID-19: Secondary | ICD-10-CM | POA: Diagnosis not present

## 2022-04-19 DIAGNOSIS — F17210 Nicotine dependence, cigarettes, uncomplicated: Secondary | ICD-10-CM | POA: Diagnosis present

## 2022-04-19 DIAGNOSIS — G894 Chronic pain syndrome: Secondary | ICD-10-CM | POA: Diagnosis not present

## 2022-04-19 DIAGNOSIS — E876 Hypokalemia: Secondary | ICD-10-CM | POA: Diagnosis present

## 2022-04-19 DIAGNOSIS — F17200 Nicotine dependence, unspecified, uncomplicated: Secondary | ICD-10-CM | POA: Diagnosis not present

## 2022-04-19 DIAGNOSIS — I4510 Unspecified right bundle-branch block: Secondary | ICD-10-CM | POA: Diagnosis present

## 2022-04-19 DIAGNOSIS — I5023 Acute on chronic systolic (congestive) heart failure: Secondary | ICD-10-CM | POA: Diagnosis present

## 2022-04-19 DIAGNOSIS — Z91141 Patient's other noncompliance with medication regimen due to financial hardship: Secondary | ICD-10-CM | POA: Diagnosis not present

## 2022-04-19 DIAGNOSIS — Z79890 Hormone replacement therapy (postmenopausal): Secondary | ICD-10-CM

## 2022-04-19 DIAGNOSIS — I130 Hypertensive heart and chronic kidney disease with heart failure and stage 1 through stage 4 chronic kidney disease, or unspecified chronic kidney disease: Secondary | ICD-10-CM | POA: Diagnosis present

## 2022-04-19 DIAGNOSIS — I2510 Atherosclerotic heart disease of native coronary artery without angina pectoris: Secondary | ICD-10-CM | POA: Diagnosis present

## 2022-04-19 DIAGNOSIS — Z8249 Family history of ischemic heart disease and other diseases of the circulatory system: Secondary | ICD-10-CM | POA: Diagnosis not present

## 2022-04-19 DIAGNOSIS — E1122 Type 2 diabetes mellitus with diabetic chronic kidney disease: Secondary | ICD-10-CM | POA: Diagnosis present

## 2022-04-19 DIAGNOSIS — R188 Other ascites: Secondary | ICD-10-CM | POA: Diagnosis present

## 2022-04-19 DIAGNOSIS — Z91030 Bee allergy status: Secondary | ICD-10-CM

## 2022-04-19 DIAGNOSIS — I480 Paroxysmal atrial fibrillation: Secondary | ICD-10-CM | POA: Diagnosis present

## 2022-04-19 DIAGNOSIS — G8929 Other chronic pain: Secondary | ICD-10-CM | POA: Diagnosis present

## 2022-04-19 DIAGNOSIS — J9601 Acute respiratory failure with hypoxia: Secondary | ICD-10-CM | POA: Diagnosis present

## 2022-04-19 DIAGNOSIS — Z79899 Other long term (current) drug therapy: Secondary | ICD-10-CM

## 2022-04-19 DIAGNOSIS — Z807 Family history of other malignant neoplasms of lymphoid, hematopoietic and related tissues: Secondary | ICD-10-CM

## 2022-04-19 DIAGNOSIS — E039 Hypothyroidism, unspecified: Secondary | ICD-10-CM | POA: Diagnosis present

## 2022-04-19 DIAGNOSIS — Z7982 Long term (current) use of aspirin: Secondary | ICD-10-CM | POA: Diagnosis not present

## 2022-04-19 DIAGNOSIS — I5043 Acute on chronic combined systolic (congestive) and diastolic (congestive) heart failure: Secondary | ICD-10-CM | POA: Diagnosis present

## 2022-04-19 DIAGNOSIS — I509 Heart failure, unspecified: Principal | ICD-10-CM

## 2022-04-19 DIAGNOSIS — I428 Other cardiomyopathies: Secondary | ICD-10-CM | POA: Diagnosis present

## 2022-04-19 DIAGNOSIS — I513 Intracardiac thrombosis, not elsewhere classified: Secondary | ICD-10-CM | POA: Diagnosis present

## 2022-04-19 DIAGNOSIS — Z7901 Long term (current) use of anticoagulants: Secondary | ICD-10-CM

## 2022-04-19 DIAGNOSIS — E782 Mixed hyperlipidemia: Secondary | ICD-10-CM

## 2022-04-19 DIAGNOSIS — E1121 Type 2 diabetes mellitus with diabetic nephropathy: Secondary | ICD-10-CM | POA: Diagnosis present

## 2022-04-19 DIAGNOSIS — Z86718 Personal history of other venous thrombosis and embolism: Secondary | ICD-10-CM

## 2022-04-19 DIAGNOSIS — E785 Hyperlipidemia, unspecified: Secondary | ICD-10-CM | POA: Diagnosis present

## 2022-04-19 DIAGNOSIS — Z5986 Financial insecurity: Secondary | ICD-10-CM

## 2022-04-19 LAB — CBC WITH DIFFERENTIAL/PLATELET
Abs Immature Granulocytes: 0.01 10*3/uL (ref 0.00–0.07)
Basophils Absolute: 0 10*3/uL (ref 0.0–0.1)
Basophils Relative: 0 %
Eosinophils Absolute: 0.1 10*3/uL (ref 0.0–0.5)
Eosinophils Relative: 2 %
HCT: 38.1 % — ABNORMAL LOW (ref 39.0–52.0)
Hemoglobin: 11.9 g/dL — ABNORMAL LOW (ref 13.0–17.0)
Immature Granulocytes: 0 %
Lymphocytes Relative: 25 %
Lymphs Abs: 1.5 10*3/uL (ref 0.7–4.0)
MCH: 31.6 pg (ref 26.0–34.0)
MCHC: 31.2 g/dL (ref 30.0–36.0)
MCV: 101.3 fL — ABNORMAL HIGH (ref 80.0–100.0)
Monocytes Absolute: 0.5 10*3/uL (ref 0.1–1.0)
Monocytes Relative: 9 %
Neutro Abs: 3.9 10*3/uL (ref 1.7–7.7)
Neutrophils Relative %: 64 %
Platelets: 265 10*3/uL (ref 150–400)
RBC: 3.76 MIL/uL — ABNORMAL LOW (ref 4.22–5.81)
RDW: 17.5 % — ABNORMAL HIGH (ref 11.5–15.5)
WBC: 6.1 10*3/uL (ref 4.0–10.5)
nRBC: 0 % (ref 0.0–0.2)

## 2022-04-19 LAB — COMPREHENSIVE METABOLIC PANEL
ALT: 10 U/L (ref 0–44)
AST: 20 U/L (ref 15–41)
Albumin: 3.1 g/dL — ABNORMAL LOW (ref 3.5–5.0)
Alkaline Phosphatase: 89 U/L (ref 38–126)
Anion gap: 8 (ref 5–15)
BUN: 25 mg/dL — ABNORMAL HIGH (ref 8–23)
CO2: 24 mmol/L (ref 22–32)
Calcium: 8.2 mg/dL — ABNORMAL LOW (ref 8.9–10.3)
Chloride: 106 mmol/L (ref 98–111)
Creatinine, Ser: 1.75 mg/dL — ABNORMAL HIGH (ref 0.61–1.24)
GFR, Estimated: 43 mL/min — ABNORMAL LOW (ref >60)
Glucose, Bld: 112 mg/dL — ABNORMAL HIGH (ref 70–99)
Potassium: 4.5 mmol/L (ref 3.5–5.1)
Sodium: 138 mmol/L (ref 135–145)
Total Bilirubin: 1 mg/dL (ref 0.3–1.2)
Total Protein: 7 g/dL (ref 6.5–8.1)

## 2022-04-19 LAB — RESP PANEL BY RT-PCR (RSV, FLU A&B, COVID)  RVPGX2
Influenza A by PCR: NEGATIVE
Influenza B by PCR: NEGATIVE
Resp Syncytial Virus by PCR: NEGATIVE
SARS Coronavirus 2 by RT PCR: NEGATIVE

## 2022-04-19 LAB — BRAIN NATRIURETIC PEPTIDE: B Natriuretic Peptide: 3994 pg/mL — ABNORMAL HIGH (ref 0.0–100.0)

## 2022-04-19 MED ORDER — ACETAMINOPHEN 650 MG RE SUPP: 650.0000 mg | Freq: Four times a day (QID) | RECTAL | Status: DC | PRN

## 2022-04-19 MED ORDER — ASPIRIN 81 MG PO TBEC
81.0000 mg | DELAYED_RELEASE_TABLET | Freq: Every day | ORAL | Status: DC
  Administered 2022-04-20 – 2022-04-24 (×5): 81 mg via ORAL
  Filled 2022-04-19 (×5): qty 1

## 2022-04-19 MED ORDER — OXYCODONE HCL 5 MG PO TABS
5.0000 mg | ORAL_TABLET | ORAL | Status: DC | PRN
  Administered 2022-04-20 – 2022-04-24 (×16): 5 mg via ORAL
  Filled 2022-04-19 (×17): qty 1

## 2022-04-19 MED ORDER — AMIODARONE HCL 200 MG PO TABS
100.0000 mg | ORAL_TABLET | Freq: Every day | ORAL | Status: DC
  Administered 2022-04-20 – 2022-04-24 (×5): 100 mg via ORAL
  Filled 2022-04-19 (×5): qty 1

## 2022-04-19 MED ORDER — PANTOPRAZOLE SODIUM 40 MG PO TBEC
40.0000 mg | DELAYED_RELEASE_TABLET | Freq: Every day | ORAL | Status: DC
  Administered 2022-04-20 – 2022-04-23 (×4): 40 mg via ORAL
  Filled 2022-04-19 (×5): qty 1

## 2022-04-19 MED ORDER — ONDANSETRON HCL 4 MG PO TABS: 4.0000 mg | ORAL_TABLET | Freq: Four times a day (QID) | ORAL | Status: DC | PRN

## 2022-04-19 MED ORDER — FUROSEMIDE 10 MG/ML IJ SOLN
40.0000 mg | Freq: Once | INTRAMUSCULAR | Status: AC
  Administered 2022-04-19: 40 mg via INTRAVENOUS
  Filled 2022-04-19: qty 4

## 2022-04-19 MED ORDER — LEVOTHYROXINE SODIUM 137 MCG PO TABS
137.0000 ug | ORAL_TABLET | Freq: Every day | ORAL | Status: DC
  Administered 2022-04-20 – 2022-04-24 (×5): 137 ug via ORAL
  Filled 2022-04-19 (×5): qty 1

## 2022-04-19 MED ORDER — ONDANSETRON HCL 4 MG/2ML IJ SOLN: 4.0000 mg | Freq: Four times a day (QID) | INTRAMUSCULAR | Status: DC | PRN

## 2022-04-19 MED ORDER — ACETAMINOPHEN 325 MG PO TABS: 650.0000 mg | ORAL_TABLET | Freq: Four times a day (QID) | ORAL | Status: DC | PRN

## 2022-04-19 MED ORDER — NICOTINE 14 MG/24HR TD PT24
14.0000 mg | MEDICATED_PATCH | Freq: Every day | TRANSDERMAL | Status: DC
  Administered 2022-04-20 – 2022-04-23 (×4): 14 mg via TRANSDERMAL
  Filled 2022-04-19 (×5): qty 1

## 2022-04-19 MED ORDER — ROSUVASTATIN CALCIUM 20 MG PO TABS
20.0000 mg | ORAL_TABLET | Freq: Every day | ORAL | Status: DC
  Administered 2022-04-20 – 2022-04-24 (×5): 20 mg via ORAL
  Filled 2022-04-19 (×5): qty 1

## 2022-04-19 MED ORDER — DAPAGLIFLOZIN PROPANEDIOL 10 MG PO TABS
10.0000 mg | ORAL_TABLET | Freq: Every day | ORAL | Status: DC
  Administered 2022-04-20 – 2022-04-24 (×5): 10 mg via ORAL
  Filled 2022-04-19 (×7): qty 1

## 2022-04-19 MED ORDER — APIXABAN 5 MG PO TABS
5.0000 mg | ORAL_TABLET | Freq: Two times a day (BID) | ORAL | Status: DC
  Administered 2022-04-20 – 2022-04-24 (×10): 5 mg via ORAL
  Filled 2022-04-19 (×10): qty 1

## 2022-04-19 MED ORDER — METOPROLOL SUCCINATE ER 50 MG PO TB24
50.0000 mg | ORAL_TABLET | Freq: Every day | ORAL | Status: DC
  Administered 2022-04-20: 50 mg via ORAL
  Filled 2022-04-19: qty 1

## 2022-04-19 MED ORDER — INSULIN ASPART 100 UNIT/ML IJ SOLN
0.0000 [IU] | Freq: Three times a day (TID) | INTRAMUSCULAR | Status: DC
  Administered 2022-04-20: 2 [IU] via SUBCUTANEOUS
  Administered 2022-04-21 – 2022-04-22 (×3): 1 [IU] via SUBCUTANEOUS
  Administered 2022-04-22 – 2022-04-23 (×3): 2 [IU] via SUBCUTANEOUS

## 2022-04-19 MED ORDER — FUROSEMIDE 10 MG/ML IJ SOLN
60.0000 mg | Freq: Two times a day (BID) | INTRAMUSCULAR | Status: DC
  Administered 2022-04-20: 60 mg via INTRAVENOUS
  Filled 2022-04-19: qty 6

## 2022-04-19 NOTE — Assessment & Plan Note (Signed)
-   Continue Eliquis 

## 2022-04-19 NOTE — ED Triage Notes (Signed)
Has had increasing SOB for several days.  States he has fluid overload he thinks.   fatigue

## 2022-04-19 NOTE — Assessment & Plan Note (Addendum)
-   Last echo shows an ejection fraction of less than 20% and grade 3 diastolic dysfunction - Patient complains of orthopnea, dyspnea on exertion, x-ray shows cardiomegaly, bilateral pleural effusions - Lasix started in the ED - Continue 60 mg IV Lasix twice daily - Monitor intake and output - Daily weights - Reds clip - Update echo - Continue beta-blocker, aspirin, Farxiga, statin - Continue to monitor

## 2022-04-19 NOTE — ED Provider Notes (Signed)
[facility] Provider Note  CSN: [id number] Arrival date & time: 04/19/22 1613  Chief Complaint(s) Shortness of Breath  HPI [name] [name] is a 67 y.o. male with PMH CHF with EF 25 to 30%, paroxysmal A-fib on amiodarone and apixaban, previous LV thrombus, poor medication compliance, T2DM, HTN, recent right lower extremity thrombectomy with vein patch in January 2024 who presents to the emergency room for evaluation of shortness of breath.  Patient states that over the last 3 days he has felt progressively more short of breath and is unable to lie flat.  He states that he has not slept in 1 week due to feeling of panic and air hunger and has been using a home inhaler for this.  He endorses abdominal distention and bilateral lower extremity swelling.  Denies chest pain, abdominal pain, nausea, vomiting, headache, fever or other systemic symptoms.   Past Medical History Past Medical History:  Diagnosis Date   Anxiety    CHF (congestive heart failure) ([place])    a. EF 15% in 2013 with cath showing normal cors b. EF 30-35% by repeat echo in 123456   Chronic systolic heart failure ([place])    Diabetes mellitus    Fluid retention in legs    Hypertension    Patient Active Problem List   Diagnosis Date Noted   Cervicalgia 03/01/2022   Dorsalgia 03/01/2022   Paresthesia of skin 03/01/2022   Other cervical disc degeneration, unspecified cervical region 03/01/2022   Other intervertebral disc degeneration, lumbar region 03/01/2022   Spondylosis of lumbar region without myelopathy or radiculopathy 03/01/2022   Other spondylosis with radiculopathy, cervical region 03/01/2022   Encounter for routine adult physical exam with abnormal findings 03/01/2022   Macrocytic anemia 02/09/2022   Ischemic leg 02/08/2022   Elevated troponin 02/08/2022   CHF (congestive heart failure) 02/08/2022   PAF (paroxysmal atrial fibrillation) 02/08/2022   Acute HFrEF (heart  failure with reduced ejection fraction) 10/17/2021   Hypokalemia 10/17/2021   Left ventricular thrombus 04/30/2019   Acute respiratory failure with hypoxia 04/25/2019   Acute on chronic systolic (congestive) heart failure 04/23/2019   Chronic pain 04/23/2019   Chronic renal failure, stage 3a 04/23/2019   Generalized anxiety disorder 08/14/2014   Diabetes mellitus type 2, uncontrolled 08/14/2014   Hyperlipidemia 08/14/2014   Onychomycosis 08/14/2014   Hypertrophic toenail 08/14/2014   Tinea versicolor 08/14/2014   Hepatomegaly 08/14/2014   Pain in joint, upper arm 03/06/2014   Body mass index (BMI) of 28.0-28.9 in adult 02/15/2014   Cardiomyopathy, dilated, nonischemic ([place]) [id number]   Chronic systolic heart failure [id number]   Type 2 diabetes with nephropathy 07/08/2011   Hypertension 07/08/2011   Home Medication(s) Prior to Admission medications   Medication Sig Start Date End Date Taking? Authorizing Provider  albuterol (VENTOLIN HFA) 108 (90 Base) MCG/ACT inhaler Inhale 2 puffs into the lungs every 6 (six) hours as needed for wheezing or shortness of breath. 03/01/22   Del Eli Hose, FNP  amiodarone (PACERONE) 200 MG tablet Take 0.5 tablets (100 mg total) by mouth daily. 06/01/21   Larey Dresser, MD  apixaban (ELIQUIS) 5 MG TABS tablet Take 1 tablet (5 mg total) by mouth 2 (two) times daily. 02/10/22   Precious Gilding, DO  aspirin EC 81 MG tablet Take 1 tablet (81 mg total) by mouth daily at 6 (six) AM. Swallow whole. 02/11/22   August Albino, MD  cephALEXin (KEFLEX) 500 MG capsule Take 1 capsule (500 mg  total) by mouth 3 (three) times daily. 02/24/22   Ulyses Amor, PA-C  dapagliflozin propanediol (FARXIGA) 10 MG TABS tablet Take 1 tablet (10 mg total) by mouth daily. 02/11/22   McDiarmid, Blane Ohara, MD  levothyroxine (SYNTHROID) 137 MCG tablet Take 137 mcg by mouth every morning. 11/09/20   [provider]  Magnesium 250 MG TABS Take 250 mg by mouth daily.     [provider]  metoprolol succinate (TOPROL-XL) 50 MG 24 hr tablet Take 1 tablet (50 mg total) by mouth daily. Take with or immediately following a meal. 02/11/22   Precious Gilding, DO  Oxycodone HCl 10 MG TABS Take 1 tablet (10 mg total) by mouth 3 (three) times daily. 03/18/22   Dagoberto Ligas, PA-C  pantoprazole (PROTONIX) 40 MG tablet Take 1 tablet (40 mg total) by mouth daily. 02/11/22   Precious Gilding, DO  polyethylene glycol powder (GLYCOLAX/MIRALAX) 17 GM/SCOOP powder Take 17 g by mouth daily. 02/10/22   Precious Gilding, DO  rosuvastatin (CRESTOR) 20 MG tablet Take 1 tablet (20 mg total) by mouth daily. 02/11/22   Precious Gilding, DO  senna-docusate (SENOKOT-S) 8.6-50 MG tablet Take 1 tablet by mouth at bedtime as needed for mild constipation. 02/10/22   Precious Gilding, DO  torsemide (DEMADEX) 20 MG tablet Take 1 tablet (20 mg total) by mouth daily as needed (fluid). 10/19/21   Murlean Iba, MD                                                                                                                                    Past Surgical History Past Surgical History:  Procedure Laterality Date   COLONOSCOPY  01/04/2012   Procedure: COLONOSCOPY;  Surgeon: Danie Binder, MD;  [facility];  Service: Endoscopy;  Laterality: N/A;  1:30 PM   ENDARTERECTOMY POPLITEAL Right 02/08/2022   Procedure: TIBIAL PERONEAL ENDARTERECTOMY;  Surgeon: Waynetta Sandy, MD;  Location: [facility];  Service: Vascular;  Laterality: Right;   HERNIA REPAIR     LEFT AND RIGHT HEART CATHETERIZATION WITH CORONARY ANGIOGRAM N/A 07/09/2011   Procedure: LEFT AND RIGHT HEART CATHETERIZATION WITH CORONARY ANGIOGRAM;  Surgeon: Peter M Martinique, MD;  Location: [facility] CATH LAB;  Service: Cardiovascular;  Laterality: N/A;   RIGHT/LEFT HEART CATH AND CORONARY ANGIOGRAPHY N/A 02/25/2020   Procedure: RIGHT/LEFT HEART CATH AND CORONARY ANGIOGRAPHY;  Surgeon: Larey Dresser, MD;  Location: [facility] CV LAB;  Service:  Cardiovascular;  Laterality: N/A;   THROMBECTOMY FEMORAL ARTERY Right 02/08/2022   Procedure: RIGHT LOWER THROMBECTOMY WITH VEIN PATCH;  Surgeon: Waynetta Sandy, MD;  Location: [facility];  Service: Vascular;  Laterality: Right;   VEIN HARVEST Right 02/08/2022   Procedure: RIGHT LOWER SAPHENOUS VEIN HARVEST;  Surgeon: Waynetta Sandy, MD;  Location: [facility];  Service: Vascular;  Laterality: Right;   Family History Family History  Problem Relation Age of Onset   Heart  attack Father    Heart failure Father    Cancer Mother        Multiple myeloma   Heart disease Sister     Social History Social History   Tobacco Use   Smoking status: Some Days    Packs/day: .25    Types: Cigarettes, E-cigarettes    Start date: 10/13/1982    Last attempt to quit: 09/19/2011    Years since quitting: 10.5    Passive exposure: Never   Smokeless tobacco: Never   Tobacco comments:    electronic cig for 4 months   Vaping Use   Vaping Use: Never used  Substance Use Topics   Alcohol use: Not Currently    Comment: weekends   Drug use: No   Allergies Bee pollen  Review of Systems Review of Systems  Respiratory:  Positive for shortness of breath.   Cardiovascular:  Positive for leg swelling.  Gastrointestinal:  Positive for abdominal distention.    Physical Exam Vital Signs  I have reviewed the triage vital signs BP 113/85 (BP Location: Right Arm)   Pulse (!) 57   Temp 97.8 F (36.6 C) (Oral)   Resp 20   Ht 6' (1.829 m)   Wt 81.6 kg   SpO2 97%   BMI 24.41 kg/m   Physical Exam Vitals and nursing note reviewed.  Constitutional:      General: He is not in acute distress.    Appearance: He is well-developed.  HENT:     Head: Normocephalic and atraumatic.  Eyes:     Conjunctiva/sclera: Conjunctivae normal.  Cardiovascular:     Rate and Rhythm: Normal rate and regular rhythm.     Heart sounds: No murmur heard. Pulmonary:     Effort: Pulmonary effort is normal. No  respiratory distress.     Breath sounds: Rales present.  Abdominal:     Palpations: Abdomen is soft.     Tenderness: There is no abdominal tenderness.  Musculoskeletal:        General: No swelling.     Cervical back: Neck supple.     Right lower leg: Edema present.     Left lower leg: Edema present.  Skin:    General: Skin is warm and dry.     Capillary Refill: Capillary refill takes less than 2 seconds.  Neurological:     Mental Status: He is alert.  Psychiatric:        Mood and Affect: Mood normal.     ED Results and Treatments Labs (all labs ordered are listed, but only abnormal results are displayed) Labs Reviewed  COMPREHENSIVE METABOLIC PANEL - Abnormal; Notable for the following components:      Result Value   Glucose, Bld 112 (*)    BUN 25 (*)    Creatinine, Ser 1.75 (*)    Calcium 8.2 (*)    Albumin 3.1 (*)    GFR, Estimated 43 (*)    All other components within normal limits  CBC WITH DIFFERENTIAL/PLATELET - Abnormal; Notable for the following components:   RBC 3.76 (*)    Hemoglobin 11.9 (*)    HCT 38.1 (*)    MCV 101.3 (*)    RDW 17.5 (*)    All other components within normal limits  BRAIN NATRIURETIC PEPTIDE - Abnormal; Notable for the following components:   B Natriuretic Peptide 3,994.0 (*)    All other components within normal limits  RESP PANEL BY RT-PCR (RSV, FLU A&B, COVID)  RVPGX2  Radiology DG Chest 2 View  Result Date: 04/19/2022 CLINICAL DATA:  SOB EXAM: CHEST - 2 VIEW COMPARISON:  02/08/2022 FINDINGS: Enlarged cardiac silhouette. Bibasilar consolidation. Moderate pleural effusion on the right and small pleural effusion on the left. Normal pulmonary vasculature. No pneumothorax. IMPRESSION: Bibasilar consolidation. Enlarged cardiac silhouette. Bilateral pleural effusions. Electronically Signed   By: Sammie Bench M.D.   On:  04/19/2022 19:57    Pertinent labs & imaging results that were available during my care of the patient were reviewed by me and considered in my medical decision making (see MDM for details).  Medications Ordered in ED Medications  furosemide (LASIX) injection 40 mg (has no administration in time range)                                                                                                                                     Procedures Ultrasound ED Abd  Date/Time: 04/19/2022 8:52 PM  Performed by: Teressa Lower, MD Authorized by: Teressa Lower, MD   Procedure details:    Assessment for:  Intra-abdominal fluid    Images: not archived    Comments:     Ascites seen and right left lower quadrants .Critical Care  Performed by: Teressa Lower, MD Authorized by: Teressa Lower, MD   Critical care provider statement:    Critical care time (minutes):  30   Critical care was necessary to treat or prevent imminent or life-threatening deterioration of the following conditions:  Respiratory failure   Critical care was time spent personally by me on the following activities:  Development of treatment plan with patient or surrogate, discussions with consultants, evaluation of patient's response to treatment, examination of patient, ordering and review of laboratory studies, ordering and review of radiographic studies, ordering and performing treatments and interventions, pulse oximetry, re-evaluation of patient's condition and review of old charts   (including critical care time)  Medical Decision Making / ED Course   This patient presents to the ED for concern of shortness of breath, this involves an extensive number of treatment options, and is a complaint that carries with it a high risk of complications and morbidity.  The differential diagnosis includes Pe, PTX, Pulmonary Edema, ARDS, COPD/Asthma, ACS, CHF exacerbation, Arrhythmia, Pericardial Effusion/Tamponade, Anemia,  Sepsis, Acidosis/Hypercapnia, Anxiety, Viral URI  MDM: Patient seen emerged from for evaluation of shortness of breath.  Physical exam with bilateral 2+ pitting lower extremity edema, rales at bilateral bases.  Laboratory evaluation with a creatinine of 1.75, BUN 25, hemoglobin 12.0, COVID, flu, RSV all negative shortness of breath, BNP significantly elevated 3994 which is elevated for this patient.  Chest x-ray with bilateral pleural effusions.  Given decreased EF and failure of outpatient oral regimen, patient require hospitalization for inpatient diuresis.  IV Lasix patient admitted.  On 2 L oxygen at time of admission.   Additional history obtained:  -External records from outside source obtained and reviewed including: Chart  review including previous notes, labs, imaging, consultation notes   Lab Tests: -I ordered, reviewed, and interpreted labs.   The pertinent results include:   Labs Reviewed  COMPREHENSIVE METABOLIC PANEL - Abnormal; Notable for the following components:      Result Value   Glucose, Bld 112 (*)    BUN 25 (*)    Creatinine, Ser 1.75 (*)    Calcium 8.2 (*)    Albumin 3.1 (*)    GFR, Estimated 43 (*)    All other components within normal limits  CBC WITH DIFFERENTIAL/PLATELET - Abnormal; Notable for the following components:   RBC 3.76 (*)    Hemoglobin 11.9 (*)    HCT 38.1 (*)    MCV 101.3 (*)    RDW 17.5 (*)    All other components within normal limits  BRAIN NATRIURETIC PEPTIDE - Abnormal; Notable for the following components:   B Natriuretic Peptide 3,994.0 (*)    All other components within normal limits  RESP PANEL BY RT-PCR (RSV, FLU A&B, COVID)  [id number]      EKG   EKG Interpretation  Date/Time:  Monday April 19 2022 16:49:07 EDT Ventricular Rate:  61 PR Interval:  234 QRS Duration: 124 QT Interval:  526 QTC Calculation: 529 R Axis:   264 Text Interpretation: Sinus rhythm with 1st degree A-V block Right ventricular hypertrophy No previous  ECGs available Confirmed by [place] ([id number]) on 04/19/2022 8:03:33 PM         Imaging Studies ordered: I ordered imaging studies including chest x-ray I independently visualized and interpreted imaging. I agree with the radiologist interpretation   Medicines ordered and prescription drug management: Meds ordered this encounter  Medications   furosemide (LASIX) injection 40 mg    -I have reviewed the patients home medicines and have made adjustments as needed  Critical interventions Diuresis, oxygen    Cardiac Monitoring: The patient was maintained on a cardiac monitor.  I personally viewed and interpreted the cardiac monitored which showed an underlying rhythm of: NSR  Social Determinants of Health:  Factors impacting patients care include: Intermittent medication noncompliance   Reevaluation: After the interventions noted above, I reevaluated the patient and found that they have :improved  Co morbidities that complicate the patient evaluation  Past Medical History:  Diagnosis Date   Anxiety    CHF (congestive heart failure) ([place])    a. EF 15% in 2013 with cath showing normal cors b. EF 30-35% by repeat echo in 123456   Chronic systolic heart failure ([place])    Diabetes mellitus    Fluid retention in legs    Hypertension       Dispostion: I considered admission for this patient, and due to fluid overload patient require hospital admission     Final Clinical Impression(s) / ED Diagnoses Final diagnoses:  None     @PCDICTATION @    Teressa Lower, MD 04/20/22 1156

## 2022-04-19 NOTE — Assessment & Plan Note (Signed)
-   Pain control with pain scale

## 2022-04-19 NOTE — Assessment & Plan Note (Signed)
-   Documented history of diabetes mellitus - Continue Farxiga - Sliding scale coverage - Glucose well-controlled at 112

## 2022-04-19 NOTE — H&P (Signed)
History and Physical    Patient: [name] [name] [id number] DOB: April 07, 1955 DOA: 04/19/2022 DOS: the patient was seen and examined on 04/19/2022 PCP: Juanda Chance, FNP  Patient coming from: Home  Chief Complaint:  Chief Complaint  Patient presents with   Shortness of Breath   HPI: [name] [name] is a 67 y.o. male with medical history significant of anxiety, CHF, systolic heart failure, diabetes mellitus, hypertension, and more presents ED with a chief complaint of dyspnea and fatigue.  Patient reports she has had dyspnea and fatigue for 4 days.  Is been progressively worse since it started.  He complains of orthopnea.  Patient thinks this all started when he had surgery for blood clot in his legs per his report, and started elevating his legs.  He thinks that made all the fluid come up to his chest.  He denies chest pain.  He is has increased work of breathing and is speaking in 3-4 word phrases.  Patient reports he does have worsening dyspnea on exertion as well as palpitations with exertion.  He has had a cough productive of gray sputum that is thick and not frothy.  He has had a subjective fever at home.  He does not wear oxygen at baseline.  Patient reports he does use an inhaler at home, but he has recently run out.  When he had it, it was helpful.  Patient reports that they changed his medication to torsemide at home, and asked him to use it as needed.  Patient reports that he has not been using it.  It seems to be prescribed to be used regularly, and not as needed.  Compliance with torsemide is likely part of his issue.  Patient has no other complaints at this time.  Patient currently smokes half a pack per day.  He drinks a beer every now and then.  He does not use illicit drugs.  He is vaccinated.  He is full code. Review of Systems: As mentioned in the history of present illness. All other systems reviewed and are negative. Past Medical History:  Diagnosis Date   Anxiety     CHF (congestive heart failure) ([place])    a. EF 15% in 2013 with cath showing normal cors b. 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    Hypertension    Past Surgical History:  Procedure Laterality Date   COLONOSCOPY  01/04/2012   Procedure: COLONOSCOPY;  Surgeon: Danie Binder, MD;  Location[facility];  Service: Endoscopy;  Laterality: N/A;  1:30 PM   ENDARTERECTOMY POPLITEAL Right 02/08/2022   Procedure: TIBIAL PERONEAL ENDARTERECTOMY;  Surgeon: Waynetta Sandy, MD;  Location: [place];  Service: Vascular;  Laterality: Right;   HERNIA REPAIR     LEFT AND RIGHT HEART CATHETERIZATION WITH CORONARY ANGIOGRAM N/A 07/09/2011   Procedure: LEFT AND RIGHT HEART CATHETERIZATION WITH CORONARY ANGIOGRAM;  Surgeon: Peter M Martinique, MD;  Location[facility] C[facility] LAB;  Service: Cardiovascular;  Laterality: N/A;   RIGHT/LEFT HEART CATH AND CORONARY ANGIOGRAPHY N/A 02/25/2020   Procedure: RIGHT/LEFT HEART CATH AND CORONARY ANGIOGRAPHY;  Surgeon: Larey Dresser, MD;  Location: [place] CV LAB;  Service: Cardiovascular;  Laterality: N/A;   THROMBECTOMY FEMORAL ARTERY Right 02/08/2022   Procedure: RIGHT LOWER THROMBECTOMY WITH VEIN PATCH;  Surgeon: Waynetta Sandy, MD;  Location: [place];  Service: Vascular;  Laterality: Right;  VEIN HARVEST Right 02/08/2022   Procedure: RIGHT LOWER SAPHENOUS VEIN HARVEST;  Surgeon: Waynetta Sandy, MD;  Location: [place];  Service: Vascular;  Laterality: Right;   Social History:  reports that he has been smoking cigarettes and e-cigarettes. He started smoking about 39 years ago. He has been smoking an average of .25 packs per day. He has never been exposed to tobacco smoke. He has never used smokeless tobacco. He reports that he does not currently use alcohol. He reports that he does not use drugs.  Allergies  Allergen Reactions   Bee Pollen Swelling    Family History   Problem Relation Age of Onset   Heart attack Father    Heart failure Father    Cancer Mother        Multiple myeloma   Heart disease Sister     Prior to Admission medications   Medication Sig Start Date End Date Taking? Authorizing Provider  albuterol (VENTOLIN HFA) 108 (90 Base) MCG/ACT inhaler Inhale 2 puffs into the lungs every 6 (six) hours as needed for wheezing or shortness of breath. 03/01/22  Yes Del Ria Comment, Lamar Benes, FNP  amiodarone (PACERONE) 200 MG tablet Take 0.5 tablets (100 mg total) by mouth daily. 06/01/21  Yes Larey Dresser, MD  apixaban (ELIQUIS) 5 MG TABS tablet Take 1 tablet (5 mg total) by mouth 2 (two) times daily. 02/10/22  Yes Precious Gilding, DO  aspirin EC 81 MG tablet Take 1 tablet (81 mg total) by mouth daily at 6 (six) AM. Swallow whole. 02/11/22  Yes August Albino, MD  dapagliflozin propanediol (FARXIGA) 10 MG TABS tablet Take 1 tablet (10 mg total) by mouth daily. 02/11/22  Yes McDiarmid, Blane Ohara, MD  levothyroxine (SYNTHROID) 137 MCG tablet Take 137 mcg by mouth every morning. 11/09/20  Yes [provider]  Magnesium 250 MG TABS Take 250 mg by mouth daily.   Yes [provider]  metoprolol succinate (TOPROL-XL) 50 MG 24 hr tablet Take 1 tablet (50 mg total) by mouth daily. Take with or immediately following a meal. 02/11/22  Yes Precious Gilding, DO  Oxycodone HCl 10 MG TABS Take 1 tablet (10 mg total) by mouth 3 (three) times daily. 03/18/22  Yes Dagoberto Ligas, PA-C  pantoprazole (PROTONIX) 40 MG tablet Take 1 tablet (40 mg total) by mouth daily. 02/11/22  Yes Precious Gilding, DO  torsemide (DEMADEX) 20 MG tablet Take 1 tablet (20 mg total) by mouth daily as needed (fluid). Patient taking differently: Take 10-20 mg by mouth daily as needed (fluid). 10/19/21  Yes Johnson, Clanford L, MD  cephALEXin (KEFLEX) 500 MG capsule Take 1 capsule (500 mg total) by mouth 3 (three) times daily. Patient not taking: Reported on 04/19/2022 02/24/22   Ulyses Amor,  PA-C  polyethylene glycol powder (GLYCOLAX/MIRALAX) 17 GM/SCOOP powder Take 17 g by mouth daily. Patient not taking: Reported on 04/19/2022 02/10/22   Precious Gilding, DO  rosuvastatin (CRESTOR) 20 MG tablet Take 1 tablet (20 mg total) by mouth daily. Patient not taking: Reported on 04/19/2022 02/11/22   Precious Gilding, DO  senna-docusate (SENOKOT-S) 8.6-50 MG tablet Take 1 tablet by mouth at bedtime as needed for mild constipation. Patient not taking: Reported on 04/19/2022 02/10/22   Precious Gilding, DO    Physical Exam: Vitals:   04/19/22 2010 04/19/22 2130 04/19/22 2200 04/19/22 2215  BP:   (!) 119/90   Pulse: (!) 57   (!) 56  Resp: 20   19  Temp:  98.3  F (36.8 C)    TempSrc:  Oral    SpO2: 97%  (!) 83%   Weight:      Height:       1.  General: Patient lying supine in bed,  no acute distress   2. Psychiatric: Alert and oriented x 3, mood and behavior normal for situation, pleasant and cooperative with exam   3. Neurologic: Speech and language are normal, face is symmetric, moves all 4 extremities voluntarily, at baseline without acute deficits on limited exam   4. HEENMT:  Head is atraumatic, normocephalic, pupils reactive to light, neck is supple, trachea is midline, mucous membranes are moist   5. Respiratory : Diminished in the lower lung fields otherwise lungs are clear to auscultation bilaterally without wheezing, rhonchi, rales, no cyanosis, no increase in work of breathing or accessory muscle use   6. Cardiovascular : Heart rate normal, rhythm is regular, no murmurs, rubs or gallops, peripheral edema present right greater than left, peripheral pulses palpated   7. Gastrointestinal:  Abdomen is soft, ascites present, nontender to palpation bowel sounds active, no masses or organomegaly palpated   8. Skin:  Skin is warm, dry and intact without rashes, acute lesions, or ulcers on limited exam   9.Musculoskeletal:  No acute deformities or trauma, no asymmetry in tone, peripheral  present right greater than left, peripheral pulses palpated, no tenderness to palpation in the extremities  Data Reviewed: In the ED Temp 97.8, heart rate 54-57, respiratory rate 20, blood pressure 113/85, satting at 97-98% on 2 L nasal cannula No leukocytosis with white blood cell count of 6.1, hemoglobin 11.9 Chemistry shows a creatinine that is at 1.75 but that is normal for him BNP is 4000 Chest x-ray shows bibasilar consolidation, enlarged heart, bilateral pleural effusions EKG shows a heart rate 61, sinus rhythm, first-degree block with a QTc of 529 Lasix 40 mg IV given in the ED Admission requested for CHF exacerbation Assessment and Plan: * Acute on chronic systolic (congestive) heart failure - Last echo shows an ejection fraction of less than 20% and grade 3 diastolic dysfunction - Patient complains of orthopnea, dyspnea on exertion, x-ray shows cardiomegaly, bilateral pleural effusions - Lasix started in the ED - Continue 60 mg IV Lasix twice daily - Monitor intake and output - Daily weights - Reds clip - Update echo - Continue beta-blocker, aspirin, Farxiga, statin - Continue to monitor  Hypothyroidism - Continue Synthroid  History of DVT (deep vein thrombosis) - Continue Eliquis  Tobacco use disorder - Smokes half a pack per day - Nicotine patch ordered - Counseled on importance of cessation  Acute respiratory failure with hypoxia - Secondary to CHF exacerbation - Maintain oxygen sats on 2 L nasal cannula - Still with increased work of breathing-anticipate improvement with diuresis - Wean off O2 as tolerated - Continue to monitor  Chronic pain - Pain control with pain scale  Hyperlipidemia - Continue statin  Type 2 diabetes with nephropathy - Documented history of diabetes mellitus - Continue Farxiga - Sliding scale coverage - Glucose well-controlled at 112      Advance Care Planning:   Code Status: Full Code  Consults: None at this  time  Family Communication: No family at bedside  Severity of Illness: The appropriate patient status for this patient is INPATIENT. Inpatient status is judged to be reasonable and necessary in order to provide the required intensity of service to ensure the patient's safety. The patient's presenting symptoms, physical exam findings, and initial  radiographic and laboratory data in the context of their chronic comorbidities is felt to place them at high risk for further clinical deterioration. Furthermore, it is not anticipated that the patient will be medically stable for discharge from the hospital within 2 midnights of admission.   * I certify that at the point of admission it is my clinical judgment that the patient will require inpatient hospital care spanning beyond 2 midnights from the point of admission due to high intensity of service, high risk for further deterioration and high frequency of surveillance required.*  Author: Rolla Plate, DO 04/19/2022 11:25 PM  For on call review [email or web address].

## 2022-04-19 NOTE — Assessment & Plan Note (Signed)
-   Secondary to CHF exacerbation - Maintain oxygen sats on 2 L nasal cannula - Still with increased work of breathing-anticipate improvement with diuresis - Wean off O2 as tolerated - Continue to monitor

## 2022-04-19 NOTE — Assessment & Plan Note (Signed)
Continue statin. 

## 2022-04-19 NOTE — Assessment & Plan Note (Signed)
-   Smokes half a pack per day - Nicotine patch ordered - Counseled on importance of cessation 

## 2022-04-19 NOTE — Assessment & Plan Note (Signed)
Continue Synthroid °

## 2022-04-20 ENCOUNTER — Other Ambulatory Visit (HOSPITAL_COMMUNITY): Payer: Self-pay | Admitting: *Deleted

## 2022-04-20 ENCOUNTER — Encounter (HOSPITAL_COMMUNITY): Payer: Self-pay | Admitting: Family Medicine

## 2022-04-20 ENCOUNTER — Other Ambulatory Visit (HOSPITAL_COMMUNITY): Payer: Medicare HMO

## 2022-04-20 DIAGNOSIS — I428 Other cardiomyopathies: Secondary | ICD-10-CM

## 2022-04-20 DIAGNOSIS — I480 Paroxysmal atrial fibrillation: Secondary | ICD-10-CM

## 2022-04-20 DIAGNOSIS — I513 Intracardiac thrombosis, not elsewhere classified: Secondary | ICD-10-CM

## 2022-04-20 DIAGNOSIS — I739 Peripheral vascular disease, unspecified: Secondary | ICD-10-CM

## 2022-04-20 LAB — COMPREHENSIVE METABOLIC PANEL
ALT: 11 U/L (ref 0–44)
AST: 16 U/L (ref 15–41)
Albumin: 3.1 g/dL — ABNORMAL LOW (ref 3.5–5.0)
Alkaline Phosphatase: 91 U/L (ref 38–126)
Anion gap: 11 (ref 5–15)
BUN: 25 mg/dL — ABNORMAL HIGH (ref 8–23)
CO2: 23 mmol/L (ref 22–32)
Calcium: 8.2 mg/dL — ABNORMAL LOW (ref 8.9–10.3)
Chloride: 105 mmol/L (ref 98–111)
Creatinine, Ser: 1.76 mg/dL — ABNORMAL HIGH (ref 0.61–1.24)
GFR, Estimated: 42 mL/min — ABNORMAL LOW (ref >60)
Glucose, Bld: 124 mg/dL — ABNORMAL HIGH (ref 70–99)
Potassium: 4.3 mmol/L (ref 3.5–5.1)
Sodium: 139 mmol/L (ref 135–145)
Total Bilirubin: 0.6 mg/dL (ref 0.3–1.2)
Total Protein: 7.3 g/dL (ref 6.5–8.1)

## 2022-04-20 LAB — BASIC METABOLIC PANEL
Anion gap: 9 (ref 5–15)
BUN: 28 mg/dL — ABNORMAL HIGH (ref 8–23)
CO2: 27 mmol/L (ref 22–32)
Calcium: 8 mg/dL — ABNORMAL LOW (ref 8.9–10.3)
Chloride: 103 mmol/L (ref 98–111)
Creatinine, Ser: 1.99 mg/dL — ABNORMAL HIGH (ref 0.61–1.24)
GFR, Estimated: 37 mL/min — ABNORMAL LOW (ref >60)
Glucose, Bld: 142 mg/dL — ABNORMAL HIGH (ref 70–99)
Potassium: 3.4 mmol/L — ABNORMAL LOW (ref 3.5–5.1)
Sodium: 139 mmol/L (ref 135–145)

## 2022-04-20 LAB — GLUCOSE, CAPILLARY
Glucose-Capillary: 90 mg/dL (ref 70–99)
Glucose-Capillary: 150 mg/dL — ABNORMAL HIGH (ref 70–99)
Glucose-Capillary: 115 mg/dL — ABNORMAL HIGH (ref 70–99)
Glucose-Capillary: 112 mg/dL — ABNORMAL HIGH (ref 70–99)

## 2022-04-20 LAB — CBC WITH DIFFERENTIAL/PLATELET
Abs Immature Granulocytes: 0.01 10*3/uL (ref 0.00–0.07)
Basophils Absolute: 0 10*3/uL (ref 0.0–0.1)
Basophils Relative: 1 %
Eosinophils Absolute: 0.1 10*3/uL (ref 0.0–0.5)
Eosinophils Relative: 2 %
HCT: 40 % (ref 39.0–52.0)
Hemoglobin: 12 g/dL — ABNORMAL LOW (ref 13.0–17.0)
Immature Granulocytes: 0 %
Lymphocytes Relative: 26 %
Lymphs Abs: 1.7 10*3/uL (ref 0.7–4.0)
MCH: 31.1 pg (ref 26.0–34.0)
MCHC: 30 g/dL (ref 30.0–36.0)
MCV: 103.6 fL — ABNORMAL HIGH (ref 80.0–100.0)
Monocytes Absolute: 0.6 10*3/uL (ref 0.1–1.0)
Monocytes Relative: 10 %
Neutro Abs: 3.9 10*3/uL (ref 1.7–7.7)
Neutrophils Relative %: 61 %
Platelets: 249 10*3/uL (ref 150–400)
RBC: 3.86 MIL/uL — ABNORMAL LOW (ref 4.22–5.81)
RDW: 17.2 % — ABNORMAL HIGH (ref 11.5–15.5)
WBC: 6.4 10*3/uL (ref 4.0–10.5)
nRBC: 0 % (ref 0.0–0.2)

## 2022-04-20 LAB — MAGNESIUM: Magnesium: 2.2 mg/dL (ref 1.7–2.4)

## 2022-04-20 MED ORDER — FUROSEMIDE 10 MG/ML IJ SOLN
15.0000 mg/h | INTRAVENOUS | Status: DC
  Administered 2022-04-20: 10 mg/h via INTRAVENOUS
  Administered 2022-04-21: 15 mg/h via INTRAVENOUS
  Administered 2022-04-21: 10 mg/h via INTRAVENOUS
  Administered 2022-04-22 – 2022-04-23 (×3): 15 mg/h via INTRAVENOUS
  Filled 2022-04-20 (×9): qty 20

## 2022-04-20 NOTE — Consult Note (Addendum)
Cardiology Consultation   Patient ID: [name] [name] MRN: [id number]; DOB: [birth year]  Admit date: 04/19/2022 Date of Consult: 04/20/2022  PCP:  Juanda Chance, [facility] Cardiologist:  Carlyle Dolly, MD  Advanced Heart Failure:  Loralie Champagne, MD       Patient Profile:   [name] [name] is a 66 y.o. male with a hx of HFrEF/NICM (EF 15% in 2013 with cath showing normal cors, EF 30-35% by repeat echo in 2015 with no outpatient follow-up from 2016 to 04/2019, EF at 25-30% by echo in 04/2019 and repeat cath in 02/2020 showing normal cors, EF < 20% in 01/2022), apical thrombus (diagnosed by echocardiogram in 04/2019), PAD (s/p RLE thromboembolectomy and right tibioperoneal trunk endarterectomy in 01/2022), paroxysmal atrial fibrillation, HTN, HLD, Type 2 DM and Stage 3 CKD who is being seen 04/20/2022 for the evaluation of CHF at the request of Dr. Roger Shelter.  History of Present Illness:   [name] [name] at [facility] in 01/2022 for right lower extremity ischemia due to occlusion of the right SFA and popliteal artery with ultimate right lower extremity thrombectomy and right tibial peroneal trunk endarterectomy by vascular surgery. Cardiology was consulted during admission given his cardiomyopathy with echocardiogram showing EF less than 20%. He was continued on Toprol-XL and [place]. Delene Loll was held given hypotension and variable renal function. He did report noncompliance with his medications prior to admission and had not been taking Eliquis or Entresto.  The importance of compliance was reviewed.  Entresto 24-26 mg twice daily and Spironolactone 25 mg daily were held at the time of discharge with him being continued on Amiodarone 100 mg daily, ASA 81 mg daily, Eliquis 5 mg twice daily, Farxiga 10 mg daily, Toprol-XL 50 mg daily, Crestor 20 mg daily and Torsemide 20 mg PRN. He no-showed for his follow-up visit with the [facility] on 02/22/2022.  He presented to [facility] ED on 04/19/2022 for evaluation of worsening dyspnea and orthopnea past few days. Says that he did notice worsening abdominal distention but was unsure if this was secondary to fluid as he typically retains fluid in his lower extremities. Therefore, he has not taking Torsemide at home. Reports compliance with his other cardiac medications and has not missed Eliquis. Denies any acute changes in his dietary habits. No recent chest pain or palpitations. Says he did not sleep for 4 days prior to admission due to orthopnea and PND.  Initial labs show WBC 6.1, Hgb 11.9, platelets 265, Na+ 138, K+ 4.5 and creatinine 1.75 (previously at 2.48 in 02/2022). BNP elevated to 3994. Negative for COVID and influenza. CXR showed bibasilar consolidation and bilateral pleural lesion. EKG showed NSR, HR 61 with 1st degree AV block and IVCD.   He was started on Lasix 60 mg twice daily at the time of admission along with being continued on Amiodarone, Farxiga and Toprol-XL. He has a recorded net output of -660 mL thus far and another 500 mL in his urinal. He is unsure of his baseline weight but recorded as 206 lbs today (was at 184 lbs in 01/2022).   Past Medical History:  Diagnosis Date   Anxiety    CHF (congestive heart failure)    a. EF 15% in 2013 with cath showing normal cors b. EF 30-35% by repeat echo in 123456   Chronic systolic heart failure    Diabetes mellitus    Fluid retention in legs  Hypertension     Past Surgical History:  Procedure Laterality Date   COLONOSCOPY  01/04/2012   Procedure: COLONOSCOPY;  Surgeon: Danie Binder, MD;  [facility];  Service: Endoscopy;  Laterality: N/A;  1:30 PM   ENDARTERECTOMY POPLITEAL Right 02/08/2022   Procedure: TIBIAL PERONEAL ENDARTERECTOMY;  Surgeon: Waynetta Sandy, MD;  Location: [place];  Service: Vascular;  Laterality: Right;   HERNIA REPAIR     LEFT AND RIGHT HEART CATHETERIZATION  WITH CORONARY ANGIOGRAM N/A 07/09/2011   Procedure: LEFT AND RIGHT HEART CATHETERIZATION WITH CORONARY ANGIOGRAM;  Surgeon: Peter M Martinique, MD;  Location: [facility] CATH LAB;  Service: Cardiovascular;  Laterality: N/A;   RIGHT/LEFT HEART CATH AND CORONARY ANGIOGRAPHY N/A 02/25/2020   Procedure: RIGHT/LEFT HEART CATH AND CORONARY ANGIOGRAPHY;  Surgeon: Larey Dresser, MD;  Location: [place] CV LAB;  Service: Cardiovascular;  Laterality: N/A;   THROMBECTOMY FEMORAL ARTERY Right 02/08/2022   Procedure: RIGHT LOWER THROMBECTOMY WITH VEIN PATCH;  Surgeon: Waynetta Sandy, MD;  Location: [place];  Service: Vascular;  Laterality: Right;   VEIN HARVEST Right 02/08/2022   Procedure: RIGHT LOWER SAPHENOUS VEIN HARVEST;  Surgeon: Waynetta Sandy, MD;  Location: [place];  Service: Vascular;  Laterality: Right;     Home Medications:  Prior to Admission medications   Medication Sig Start Date End Date Taking? Authorizing Provider  albuterol (VENTOLIN HFA) 108 (90 Base) MCG/ACT inhaler Inhale 2 puffs into the lungs every 6 (six) hours as needed for wheezing or shortness of breath. 03/01/22  Yes [place]ia Comment, Lamar Benes, FNP  amiodarone (PACERONE) 200 MG tablet Take 0.5 tablets (100 mg total) by mouth daily. 06/01/21  Yes Larey Dresser, MD  apixaban (ELIQUIS) 5 MG TABS tablet Take 1 tablet (5 mg total) by mouth 2 (two) times daily. 02/10/22  Yes Precious Gilding, DO  aspirin EC 81 MG tablet Take 1 tablet (81 mg total) by mouth daily at 6 (six) AM. Swallow whole. 02/11/22  Yes August Albino, MD  dapagliflozin propanediol (FARXIGA) 10 MG TABS tablet Take 1 tablet (10 mg total) by mouth daily. 02/11/22  Yes McDiarmid, Blane Ohara, MD  levothyroxine (SYNTHROID) 137 MCG tablet Take 137 mcg by mouth every morning. 11/09/20  Yes [provider]  Magnesium 250 MG TABS Take 250 mg by mouth daily.   Yes [provider]  metoprolol succinate (TOPROL-XL) 50 MG 24 hr tablet Take 1 tablet (50 mg total) by  mouth daily. Take with or immediately following a meal. 02/11/22  Yes Precious Gilding, DO  Oxycodone HCl 10 MG TABS Take 1 tablet (10 mg total) by mouth 3 (three) times daily. 03/18/22  Yes Dagoberto Ligas, PA-C  pantoprazole (PROTONIX) 40 MG tablet Take 1 tablet (40 mg total) by mouth daily. 02/11/22  Yes Precious Gilding, DO  torsemide (DEMADEX) 20 MG tablet Take 1 tablet (20 mg total) by mouth daily as needed (fluid). Patient taking differently: Take 10-20 mg by mouth daily as needed (fluid). 10/19/21  Yes Johnson, Clanford L, MD  cephALEXin (KEFLEX) 500 MG capsule Take 1 capsule (500 mg total) by mouth 3 (three) times daily. Patient not taking: Reported on 04/19/2022 02/24/22   Ulyses Amor, PA-C  polyethylene glycol powder (GLYCOLAX/MIRALAX) 17 GM/SCOOP powder Take 17 g by mouth daily. Patient not taking: Reported on 04/19/2022 02/10/22   Precious Gilding, DO  rosuvastatin (CRESTOR) 20 MG tablet Take 1 tablet (20 mg total) by mouth daily. Patient not taking: Reported on 04/19/2022 02/11/22  Precious Gilding, DO  senna-docusate (SENOKOT-S) 8.6-50 MG tablet Take 1 tablet by mouth at bedtime as needed for mild constipation. Patient not taking: Reported on 04/19/2022 02/10/22   Precious Gilding, DO    Inpatient Medications: Scheduled Meds:  amiodarone  100 mg Oral Daily   apixaban  5 mg Oral BID   aspirin EC  81 mg Oral Q0600   dapagliflozin propanediol  10 mg Oral Daily   furosemide  60 mg Intravenous BID   insulin aspart  0-9 Units Subcutaneous TID WC   levothyroxine  137 mcg Oral Q0600   metoprolol succinate  50 mg Oral Daily   nicotine  14 mg Transdermal Daily   pantoprazole  40 mg Oral Daily   rosuvastatin  20 mg Oral Daily   Continuous Infusions:  PRN Meds: acetaminophen **OR** acetaminophen, ondansetron **OR** ondansetron (ZOFRAN) IV, oxyCODONE  Allergies:    Allergies  Allergen Reactions   Bee Pollen Swelling    Social History:   Social History   Socioeconomic History   Marital status: Married     Spouse name: Not on file   Number of children: 0   Years of education: Not on file   Highest education level: Not on file  Occupational History   Occupation: disabled  Tobacco Use   Smoking status: Some Days    Packs/day: .25    Types: Cigarettes, E-cigarettes    Start date: 10/13/1982    Last attempt to quit: 09/19/2011    Years since quitting: 10.5    Passive exposure: Never   Smokeless tobacco: Never   Tobacco comments:    electronic cig for 4 months   Vaping Use   Vaping Use: Never used  Substance and Sexual Activity   Alcohol use: Not Currently    Comment: weekends   Drug use: No   Sexual activity: Not on file  Other Topics Concern   Not on file  Social History Narrative   Patient is right handed.   Patient seldom drinks caffeine.   Social Determinants of Health   Financial Resource Strain: Medium Risk (04/07/2022)   Overall Financial Resource Strain (CARDIA)    Difficulty of Paying Living Expenses: Somewhat hard  Food Insecurity: No Food Insecurity (04/20/2022)   Hunger Vital Sign    Worried About Running Out of Food in the Last Year: Never true    Ran Out of Food in the Last Year: Never true  Transportation Needs: No Transportation Needs (04/20/2022)   PRAPARE - Hydrologist (Medical): No    Lack of Transportation (Non-Medical): No  Physical Activity: Inactive (04/07/2022)   Exercise Vital Sign    Days of Exercise per Week: 0 days    Minutes of Exercise per Session: 0 min  Stress: No Stress Concern Present (04/07/2022)   [place]    Feeling of Stress : Not at all  Social Connections: Moderately Isolated (04/07/2022)   Social Connection and Isolation Panel [NHANES]    Frequency of Communication with Friends and Family: Twice a week    Frequency of Social Gatherings with Friends and Family: Never    Attends Religious Services: 1 to 4 times per year    Active Member of  [employer] or Organizations: No    Attends Archivist Meetings: Never    Marital Status: Married  Human resources officer Violence: Not At Risk (04/20/2022)   Humiliation, Afraid, Rape, and Kick questionnaire    Fear of  Current or Ex-Partner: No    Emotionally Abused: No    Physically Abused: No    Sexually Abused: No    Family History:    Family History  Problem Relation Age of Onset   Heart attack Father    Heart failure Father    Cancer Mother        Multiple myeloma   Heart disease Sister      ROS:  Please see the history of present illness.   All other ROS reviewed and negative.     Physical Exam/Data:   Vitals:   04/20/22 0500 04/20/22 0600 04/20/22 0800 04/20/22 0811  BP:    114/81  Pulse:    61  Resp: (!) 0 (!) 30 (!) 34 (!) 22  Temp:    (!) 97.5 F (36.4 C)  TempSrc:    Oral  SpO2:    100%  Weight: 93.6 kg     Height:        Intake/Output Summary (Last 24 hours) at 04/20/2022 0915 Last data filed at 04/20/2022 [id number] Gross per 24 hour  Intake 240 ml  Output 900 ml  Net -660 ml      04/20/2022    5:00 AM 04/20/2022   12:30 AM 04/19/2022    4:43 PM  Last 3 Weights  Weight (lbs) 206 lb 5.6 oz 205 lb 14.6 oz 180 lb  Weight (kg) 93.6 kg 93.4 kg 81.647 kg     Body mass index is 27.99 kg/m.  General:  Well nourished, well developed male appearing in no acute distress. HEENT: normal Neck: JVD at 11-12 cm.  Vascular: No carotid bruits; Distal pulses 2+ bilaterally Cardiac:  normal S1, S2; RRR; no murmur  Lungs: decreased breath sounds along bases bilaterally.  Abd: appears distended.  Ext: 1+ pitting edema bilaterally.  Musculoskeletal:  No deformities, BUE and BLE strength normal and equal Skin: warm and dry  Neuro:  CNs 2-12 intact, no focal abnormalities noted Psych:  Normal affect   EKG:  The EKG was personally reviewed and demonstrates: NSR, HR 61 with 1st degree AV block and RBBB.  Telemetry:  Telemetry was personally reviewed and demonstrates: Sinus  bradycardia, HR in 50's to 60's with occasional PVC's.   Relevant CV Studies:   R/LHC: 02/2020 1. Normal/low filling pressures.  2. Low but not markedly low cardiac output.  3. No obstructive CAD, nonischemic cardiomyopathy.    Minimal contrast used.    Echocardiogram: 01/2022 IMPRESSIONS     1. Large, mobile apical thrombus measuring 1.9 cm x 0.96 cm. Left  ventricular ejection fraction, by estimation, is <20%. Left ventricular  ejection fraction by PLAX is 12 %. The left ventricle has severely  decreased function. The left ventricle  demonstrates global hypokinesis. There is mild concentric left ventricular  hypertrophy. Left ventricular diastolic parameters are consistent with  Grade III diastolic dysfunction (restrictive). Elevated left ventricular  end-diastolic pressure.   2. Right ventricular systolic function is mildly reduced. The right  ventricular size is normal. There is moderately elevated pulmonary artery  systolic pressure.   3. Left atrial size was severely dilated.   4. Right atrial size was severely dilated.   5. The mitral valve is normal in structure. Mild mitral valve  regurgitation. No evidence of mitral stenosis.   6. The aortic valve is normal in structure. Aortic valve regurgitation is  not visualized. No aortic stenosis is present.   7. Aortic dilatation noted. There is mild dilatation of the ascending  aorta, measuring 42 mm.   8. The inferior vena cava is normal in size with greater than 50%  respiratory variability, suggesting right atrial pressure of 3 mmHg.     Laboratory Data:  High Sensitivity Troponin:  No results for input(s): "TROPONINIHS" in the last 720 hours.   Chemistry Recent Labs  Lab 04/19/22 1931 04/20/22 0354  NA 138 139  K 4.5 4.3  CL 106 105  CO2 24 23  GLUCOSE 112* 124*  BUN 25* 25*  CREATININE 1.75* 1.76*  CALCIUM 8.2* 8.2*  MG  --  2.2  GFRNONAA 43* 42*  ANIONGAP 8 11    Recent Labs  Lab 04/19/22 1931  04/20/22 0354  PROT 7.0 7.3  ALBUMIN 3.1* 3.1*  AST 20 16  ALT 10 11  ALKPHOS 89 91  BILITOT 1.0 0.6   Lipids No results for input(s): "CHOL", "TRIG", "HDL", "LABVLDL", "LDLCALC", "CHOLHDL" in the last 168 hours.  Hematology Recent Labs  Lab 04/19/22 1931 04/20/22 0354  WBC 6.1 6.4  RBC 3.76* 3.86*  HGB 11.9* 12.0*  HCT 38.1* 40.0  MCV 101.3* 103.6*  MCH 31.6 31.1  MCHC 31.2 30.0  RDW 17.5* 17.2*  PLT 265 249   Thyroid No results for input(s): "TSH", "FREET4" in the last 168 hours.  BNP Recent Labs  Lab 04/19/22 1931  BNP 3,994.0*    DDimer No results for input(s): "DDIMER" in the last 168 hours.   Radiology/Studies:  DG Chest 2 View  Result Date: 04/19/2022 CLINICAL DATA:  SOB EXAM: CHEST - 2 VIEW COMPARISON:  02/08/2022 FINDINGS: Enlarged cardiac silhouette. Bibasilar consolidation. Moderate pleural effusion on the right and small pleural effusion on the left. Normal pulmonary vasculature. No pneumothorax. IMPRESSION: Bibasilar consolidation. Enlarged cardiac silhouette. Bilateral pleural effusions. Electronically Signed   By: Sammie Bench M.D.   On: 04/19/2022 19:57     Assessment and Plan:   1. 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  - Presented with abdominal distension, orthopnea and PND and was not taking PRN Torsemide as he thought this was for lower extremity edema only. - BNP elevated to 3994 on admission and CXR consistent with CHF. He does appear volume overloaded on examination today. Agree with IV Lasix 60mg  BID and pending response, can titrate to IV 80mg  BID. Follow I&O's along with daily weights. Will cancel repeat echo for today as he just had one in 01/2022. He will need a standing dose of Torsemide at discharge. He has been continued on Toprol-XL and [place]. Pending BP and renal function, can restart   Entresto +/- Spironolactone prior to discharge. ICD previously reviewed with the patient by [facility] but he did not follow-up with EP. Needs to be compliant with medications.   2. Apical Thrombus - He has a long-standing history of this and echo in 01/2022 showed a large, mobile apical thrombus measuring 1.9 cm x 0.96 cm. He has been continued on Eliquis for anticoagulation. Not on Coumadin due to noncompliance.   3. Paroxysmal Atrial Fibrillation - Previously referred to EP for consideration of ablation but did not follow-up. Has remained on Amiodarone 100mg  daily and is maintaining NSR. LFT's WNL this admission. TSH normal at 3.020 in 02/2022 with Free T4 at 2.09. Remains on Levothyroxine.  - Continue Eliquis 5mg  BID for anticoagulation.   4. PAD - He is s/p RLE thromboembolectomy  and right tibioperoneal trunk endarterectomy in 01/2022 after being noncompliant with anticoagulation as an outpatient. Followed by Vascular Surgery as an outpatient.  - He has been continued on ASA and Eliquis.   5. HTN - BP has been stable since admission, at 114/81 on most recent check. Remains on Toprol-XL.   6. Stage 3 CKD - Baseline creatinine 1.7 - 1.8. Peaked at 2.48 in 02/2022. Stable at 1.76 today. Follow closely while diuresing.    For questions or updates, please contact [place] Please consult [email or web address] for contact info under    Signed, Erma Heritage, PA-C  04/20/2022 9:15 AM  Attending attestation  Patient seen and independently examined with Bernerd Pho, PA-C. We discussed all aspects of the encounter. I agree with the assessment and plan as stated above.  Patient is a 67 year old M known to have NICM LVEF less than 20% with no device, apical thrombus diagnosed in 01/2022, PAD, stage III CKD presented to the ER with DOE, orthopnea and PND. He is currently admitted to hospitalist team for the management of acute on chronic systolic heart failure exacerbation. Physical  examination showed, patient not in acute respiratory distress, HEENT normal, JVD elevated to the angle of mandible, S1-S2 heard, clear lungs, nontender abdomen, no pitting edema in bilateral lower extremities.  # Acute on chronic systolic heart failure exacerbation # NICM LVEF less than 20% with no device -Patient has symptoms of DOE, orthopnea and PND. JVD elevated to the angle of mandible, no pitting edema in lower extremities. Chest x-ray showed bilateral pleural effusions. Currently on IV Lasix 60 mg twice daily, will discontinue and start Lasix drip at 10 mg/h. Discontinued metoprolol due to severely reduced systolic function of the left ventricle. -Daily weights, 1.2 L fluid restriction, ins and outs -EKG showed normal sinus rhythm, RBBB -Patient interested to follow-up with [facility] cardiology and has transportation issues to go to [place]. Will schedule follow-up in [place] per patient's request.  # Apical mural thrombus diagnosed in 01/2022: Continue Eliquis 5 mg twice daily. Not on Coumadin due to noncompliance issues. Will need to repeat limited echocardiogram with contrast in a few months to evaluate for resolution of LV thrombus. # Paroxysmal A-fib: Continue amiodarone 100 mg once daily, discontinue metoprolol due to severely reduced LV systolic function and continue Eliquis 5 mg twice daily. # PAD s/p R thromboembolectomy (embolism likely from LV thrombus due to noncompliance with Eliquis) and R TP trunk endarterectomy in 01/2022: Continue aspirin and Eliquis. Follows up with vascular surgery.  I have spent a total of 60 minutes with patient reviewing chart , telemetry, EKGs, labs and examining patient as well as establishing an assessment and plan that was discussed with the patient.  > 50% of time was spent in direct patient care.    Fannie Gathright Fidel Levy, MD [place]  12:54 PM

## 2022-04-20 NOTE — Hospital Course (Signed)
[name] [name] is a 66 y.o. male with medical history significant of anxiety, CHF, systolic heart failure, diabetes mellitus, hypertension, and more presents ED with a chief complaint of dyspnea and fatigue.  Patient reports she has had dyspnea and fatigue for 4 days.  Is been progressively worse since it started.  He complains of orthopnea.  Patient thinks this all started when he had surgery for blood clot in his legs per his report, and started elevating his legs.  He thinks that made all the fluid come up to his chest.  He denies chest pain.  He is has increased work of breathing and is speaking in 3-4 word phrases.  Patient reports he does have worsening dyspnea on exertion as well as palpitations with exertion.  He has had a cough productive of gray sputum that is thick and not frothy.  He has had a subjective fever at home.  He does not wear oxygen at baseline.  Patient reports he does use an inhaler at home, but he has recently run out.  When he had it, it was helpful.  Patient reports that they changed his medication to torsemide at home, and asked him to use it as needed.  Patient reports that he has not been using it.  It seems to be prescribed to be used regularly, and not as needed.  Compliance with torsemide is likely part of his issue.  Patient has no other complaints at this time.   Patient currently smokes half a pack per day.  He drinks a beer every now and then.  He does not use illicit drugs.  He is vaccinated.  He is full code.   ED Temp 97.8, heart rate 54-57, respiratory rate 20, blood pressure 113/85, satting at 97-98% on 2 L nasal cannula No leukocytosis with white blood cell count of 6.1, hemoglobin 11.9 Chemistry shows a creatinine that is at 1.75 but that is normal for him BNP is 4000 Chest x-ray shows bibasilar consolidation, enlarged heart, bilateral pleural effusions EKG shows a heart rate 61, sinus rhythm, first-degree block with a QTc of 529 Lasix 40 mg IV given in the  ED Admission requested for CHF exacerbation

## 2022-04-20 NOTE — TOC Initial Note (Signed)
Transition of Care [facility]) - Initial/Assessment Note    Patient Details  Name: [name] [name] MRN: [id number] Date of Birth: [birth year]  Transition of [facility]) CM/SW Contact:    Iona Beard, [place] Phone Number: 04/20/2022, 11:09 AM  Clinical Narrative:                 [facility] consulted for CHF screen. CSW spoke with pt in room to complete assessment. Pt lives with his spouse. Pt is independent in completing his ADLs. Pt able to drive to appointments as needed. Pt has not had HH but feels that he would be interested in this at D/C. CSW explained that this can be set up prior to hospital D/C. Pt has a walker to use when needed.   Pt states that he weighs himself daily. Pt follows a heart healthy diet. Pt states he follows a heart healthy diet. TOC to follow.   Expected Discharge Plan: [place] Barriers to Discharge: Continued Medical Work up   Patient Goals and CMS Choice Patient states their goals for this hospitalization and ongoing recovery are:: return home CMS Medicare.gov Compare Post Acute Care list provided to:: Patient Choice offered to / list presented to : Patient [place] ownership interest in [facility].provided to:: Patient    Expected Discharge Plan and Services In-house Referral: Clinical Social Work Discharge Planning Services: CM Consult Post Acute Care Choice: [place] arrangements for the past 2 months: Single Family Home                                      Prior Living Arrangements/Services Living arrangements for the past 2 months: Single Family Home Lives with:: Spouse Patient language and need for interpreter reviewed:: Yes Do you feel safe going back to the place where you live?: Yes      Need for Family Participation in Patient Care: Yes (Comment) Care giver support system in place?: Yes (comment) Current home services: DME (walker) Criminal Activity/Legal Involvement Pertinent to Current  Situation/Hospitalization: No - Comment as needed  Activities of Daily Living Home Assistive Devices/Equipment: Walker (specify type) ADL Screening (condition at time of admission) Patient's cognitive ability adequate to safely complete daily activities?: Yes Is the patient deaf or have difficulty hearing?: No Does the patient have difficulty seeing, even when wearing glasses/contacts?: No Does the patient have difficulty concentrating, remembering, or making decisions?: No Patient able to express need for assistance with ADLs?: Yes Does the patient have difficulty dressing or bathing?: Yes Independently performs ADLs?: Yes (appropriate for developmental age) Does the patient have difficulty walking or climbing stairs?: Yes Weakness of Legs: Right Weakness of Arms/Hands: None  Permission Sought/Granted                  Emotional Assessment Appearance:: Appears stated age Attitude/Demeanor/Rapport: Engaged Affect (typically observed): Accepting Orientation: : Oriented to Self, Oriented to Place, Oriented to  Time, Oriented to Situation Alcohol / Substance Use: Not Applicable Psych Involvement: No (comment)  Admission diagnosis:  CHF exacerbation [I50.9] Acute on chronic congestive heart failure, unspecified heart failure type [I50.9] Patient Active Problem List   Diagnosis Date Noted   Tobacco use disorder 04/19/2022   History of DVT (deep vein thrombosis) 04/19/2022   Hypothyroidism 04/19/2022   Cervicalgia 03/01/2022   Dorsalgia 03/01/2022   Paresthesia of skin 03/01/2022   Other cervical disc degeneration, unspecified cervical  region 03/01/2022   Other intervertebral disc degeneration, lumbar region 03/01/2022   Spondylosis of lumbar region without myelopathy or radiculopathy 03/01/2022   Other spondylosis with radiculopathy, cervical region 03/01/2022   Encounter for routine adult physical exam with abnormal findings 03/01/2022   Macrocytic anemia 02/09/2022    Ischemic leg 02/08/2022   Elevated troponin 02/08/2022   CHF (congestive heart failure) 02/08/2022   PAF (paroxysmal atrial fibrillation) 02/08/2022   Acute HFrEF (heart failure with reduced ejection fraction) 10/17/2021   Hypokalemia 10/17/2021   Left ventricular thrombus 04/30/2019   Acute respiratory failure with hypoxia 04/25/2019   Acute on chronic systolic (congestive) heart failure 04/23/2019   Chronic pain 04/23/2019   Chronic renal failure, stage 3a 04/23/2019   Generalized anxiety disorder 08/14/2014   Diabetes mellitus type 2, uncontrolled 08/14/2014   Hyperlipidemia 08/14/2014   Onychomycosis 08/14/2014   Hypertrophic toenail 08/14/2014   Tinea versicolor 08/14/2014   Hepatomegaly 08/14/2014   Pain in joint, upper arm 03/06/2014   Body mass index (BMI) of 28.0-28.9 in adult 02/15/2014   Cardiomyopathy, dilated, nonischemic ([place]) [id number]   Chronic systolic heart failure [id number]   Type 2 diabetes with nephropathy 07/08/2011   Hypertension 07/08/2011   PCP:  Juanda Chance, FNP Pharmacy:   [facility], [place] [address] Phone: [phone] Fax: [phone]  [facility] [place], [place] [address] Phone: [phone] Fax: [phone]  [facility] G[address]yn [address], [place] [address] [place] [address] Phone: [phone] Fax: [phone]     Social Determinants of Health ([place]) Social History: SDOH Screenings   Food Insecurity: No Food Insecurity (04/20/2022)  Housing: Low Risk  (04/20/2022)  Transportation Needs: No Transportation Needs (04/20/2022)  Utilities: Not At Risk (04/20/2022)  Alcohol Screen: Low Risk  (04/07/2022)  Depression (PHQ2-9): High Risk (03/01/2022)  Financial Resource Strain: Medium Risk (04/07/2022)  Physical Activity: Inactive (04/07/2022)  Social  Connections: Moderately Isolated (04/07/2022)  Stress: No Stress Concern Present (04/07/2022)  Tobacco Use: High Risk (04/20/2022)   SDOH Interventions: Housing Interventions: Intervention Not Indicated   Readmission Risk Interventions    04/20/2022   11:07 AM 02/10/2022    2:42 PM  Readmission Risk Prevention Plan  Transportation Screening Complete Complete  HRI or Home Care Consult Complete Complete  Social Work Consult for [place] Planning/Counseling Complete Complete  Palliative Care Screening Not Applicable Not Applicable  Medication Review Press photographer) Complete Complete

## 2022-04-21 DIAGNOSIS — I739 Peripheral vascular disease, unspecified: Secondary | ICD-10-CM

## 2022-04-21 DIAGNOSIS — I5023 Acute on chronic systolic (congestive) heart failure: Secondary | ICD-10-CM | POA: Diagnosis not present

## 2022-04-21 DIAGNOSIS — I428 Other cardiomyopathies: Secondary | ICD-10-CM

## 2022-04-21 LAB — BRAIN NATRIURETIC PEPTIDE: B Natriuretic Peptide: 3276 pg/mL — ABNORMAL HIGH (ref 0.0–100.0)

## 2022-04-21 LAB — GLUCOSE, CAPILLARY
Glucose-Capillary: 92 mg/dL (ref 70–99)
Glucose-Capillary: 106 mg/dL — ABNORMAL HIGH (ref 70–99)
Glucose-Capillary: 148 mg/dL — ABNORMAL HIGH (ref 70–99)
Glucose-Capillary: 138 mg/dL — ABNORMAL HIGH (ref 70–99)
Glucose-Capillary: 124 mg/dL — ABNORMAL HIGH (ref 70–99)

## 2022-04-21 LAB — CBC
HCT: 35.7 % — ABNORMAL LOW (ref 39.0–52.0)
Hemoglobin: 11.3 g/dL — ABNORMAL LOW (ref 13.0–17.0)
MCH: 31.5 pg (ref 26.0–34.0)
MCHC: 31.7 g/dL (ref 30.0–36.0)
MCV: 99.4 fL (ref 80.0–100.0)
Platelets: 255 10*3/uL (ref 150–400)
RBC: 3.59 MIL/uL — ABNORMAL LOW (ref 4.22–5.81)
RDW: 17.1 % — ABNORMAL HIGH (ref 11.5–15.5)
WBC: 5.1 10*3/uL (ref 4.0–10.5)
nRBC: 0 % (ref 0.0–0.2)

## 2022-04-21 LAB — BASIC METABOLIC PANEL
Anion gap: 11 (ref 5–15)
Anion gap: 12 (ref 5–15)
BUN: 27 mg/dL — ABNORMAL HIGH (ref 8–23)
BUN: 27 mg/dL — ABNORMAL HIGH (ref 8–23)
CO2: 30 mmol/L (ref 22–32)
CO2: 32 mmol/L (ref 22–32)
Calcium: 8.2 mg/dL — ABNORMAL LOW (ref 8.9–10.3)
Calcium: 8.3 mg/dL — ABNORMAL LOW (ref 8.9–10.3)
Chloride: 99 mmol/L (ref 98–111)
Chloride: 94 mmol/L — ABNORMAL LOW (ref 98–111)
Creatinine, Ser: 1.83 mg/dL — ABNORMAL HIGH (ref 0.61–1.24)
Creatinine, Ser: 1.83 mg/dL — ABNORMAL HIGH (ref 0.61–1.24)
GFR, Estimated: 40 mL/min — ABNORMAL LOW (ref >60)
GFR, Estimated: 40 mL/min — ABNORMAL LOW (ref >60)
Glucose, Bld: 114 mg/dL — ABNORMAL HIGH (ref 70–99)
Glucose, Bld: 107 mg/dL — ABNORMAL HIGH (ref 70–99)
Potassium: 3.4 mmol/L — ABNORMAL LOW (ref 3.5–5.1)
Potassium: 3.3 mmol/L — ABNORMAL LOW (ref 3.5–5.1)
Sodium: 140 mmol/L (ref 135–145)
Sodium: 138 mmol/L (ref 135–145)

## 2022-04-21 LAB — MRSA NEXT GEN BY PCR, NASAL: MRSA by PCR Next Gen: NOT DETECTED

## 2022-04-21 LAB — MAGNESIUM: Magnesium: 1.9 mg/dL (ref 1.7–2.4)

## 2022-04-21 MED ORDER — CHLORHEXIDINE GLUCONATE CLOTH 2 % EX PADS
6.0000 | MEDICATED_PAD | Freq: Every day | CUTANEOUS | Status: DC
  Administered 2022-04-21: 6 via TOPICAL

## 2022-04-21 MED ORDER — POTASSIUM CHLORIDE 10 MEQ/100ML IV SOLN
10.0000 meq | INTRAVENOUS | Status: AC
  Administered 2022-04-21: 10 meq via INTRAVENOUS
  Filled 2022-04-21: qty 100

## 2022-04-21 MED ORDER — POTASSIUM CHLORIDE CRYS ER 20 MEQ PO TBCR
40.0000 meq | EXTENDED_RELEASE_TABLET | Freq: Two times a day (BID) | ORAL | Status: AC
  Administered 2022-04-21 (×2): 40 meq via ORAL
  Filled 2022-04-21 (×2): qty 2

## 2022-04-21 NOTE — Progress Notes (Addendum)
Telemetry called charge nurse Izora Gala to give a report that patient had a 10 beat run of vtach. Patient is currently 70 on the monitor. Physical therapy was working with patient when he started not feeling well. Patient is now resting in the bed. MD Manuella Ghazi notified.

## 2022-04-21 NOTE — Progress Notes (Signed)
PROGRESS NOTE    [name] [name]  [id number] DOB: 1955/08/26 DOA: 04/19/2022 PCP: Juanda Chance, FNP   Brief Narrative:    [name] [name] is a 67 y.o. male with medical history significant of anxiety, CHF, systolic heart failure, diabetes mellitus, hypertension, and more presents ED with a chief complaint of dyspnea and fatigue.  He was admitted with acute HFrEF with LVEF 20%.  He is noted to have medication noncompliance and has been started on IV Lasix drip for diuresis.  He is on Eliquis for anticoagulation due to apical thrombus and cardiology is following.  Assessment & Plan:   Principal Problem:   Acute on chronic systolic (congestive) heart failure Active Problems:   Type 2 diabetes with nephropathy   Hyperlipidemia   Chronic pain   Acute respiratory failure with hypoxia   Tobacco use disorder   History of DVT (deep vein thrombosis)   Hypothyroidism  Assessment and Plan:  Acute hypoxemic respiratory failure secondary to acute on chronic systolic (congestive) heart failure - Last echo shows an ejection fraction of less than 20% and grade 3 diastolic dysfunction - Appreciate cardiology recommendations with ongoing IV Lasix drip -Monitor daily weights and strict I's and O's -Weaned off of oxygen at this time  Apical mural thrombus -Continue Eliquis twice daily -He will require repeat echo in a few months to evaluate for resolution of thrombus  Paroxysmal atrial fibrillation -Continue amiodarone 100 mg once daily and metoprolol discontinued due to severely reduced LV function -Continue Eliquis 5 mg twice daily for anticoagulation  PAD status post right thromboembolectomy and right TP trunk endarterectomy 01/2022 -Likely due to noncompliance with Eliquis/LV thrombus -Continue aspirin and Eliquis -Follow-up with vascular surgery  Mild hypokalemia -Continue to replete orally in the setting of aggressive diuresis -Close monitoring in  a.m.  Hypothyroidism - Continue Synthroid  Tobacco use disorder - Smokes half a pack per day - Nicotine patch ordered - Counseled on importance of cessation  Chronic pain - Pain control with pain scale  Hyperlipidemia - Continue statin  Type 2 diabetes with nephropathy - Documented history of diabetes mellitus - Continue Farxiga - Sliding scale coverage - Glucose well-controlled at 112  DVT prophylaxis:Eliquis Code Status: Full Family Communication: None at bedside Disposition Plan:  Status is: Inpatient Remains inpatient appropriate because: Need for IV medications.  Consultants:  Cardiology  Procedures:  None  Antimicrobials:  None   Subjective: Patient seen and evaluated today with no new acute complaints or concerns. No acute concerns or events noted overnight.  He has been urinating frequently.  Objective: Vitals:   04/21/22 0630 04/21/22 0700 04/21/22 0731 04/21/22 0800  BP:  129/69  119/71  Pulse:      Resp: 17 (!) 28  (!) 24  Temp:   97.6 F (36.4 C)   TempSrc:   Oral   SpO2: 100%   100%  Weight:      Height:        Intake/Output Summary (Last 24 hours) at 04/21/2022 0933 Last data filed at 04/21/2022 0901 Gross per 24 hour  Intake 1023.63 ml  Output 6525 ml  Net -5501.37 ml   Filed Weights   04/20/22 0500 04/21/22 0434 04/21/22 0515  Weight: 93.6 kg 92.5 kg 87.8 kg    Examination:  General exam: Appears calm and comfortable  Respiratory system: Clear to auscultation. Respiratory effort normal. Cardiovascular system: S1 & S2 heard, RRR.  Gastrointestinal system: Abdomen is soft Central nervous system: Alert and awake Extremities:  No edema Skin: No significant lesions noted Psychiatry: Flat affect.    Data Reviewed: I have personally reviewed following labs and imaging studies  CBC: Recent Labs  Lab 04/19/22 1931 04/20/22 0354 04/21/22 0446  WBC 6.1 6.4 5.1  NEUTROABS 3.9 3.9  --   HGB 11.9* 12.0* 11.3*  HCT 38.1* 40.0  35.7*  MCV 101.3* 103.6* 99.4  PLT 265 249 [id number]   Basic Metabolic Panel: Recent Labs  Lab 04/19/22 1931 04/20/22 0354 04/20/22 1826 04/21/22 0446  NA 138 139 139 140  K 4.5 4.3 3.4* 3.4*  CL 106 105 103 99  CO2 24 23 27 30   GLUCOSE 112* 124* 142* 114*  BUN 25* 25* 28* 27*  CREATININE 1.75* 1.76* 1.99* 1.83*  CALCIUM 8.2* 8.2* 8.0* 8.2*  MG  --  2.2  --   --    GFR: Estimated Creatinine Clearance: 44.2 mL/min (A) (by C-G formula based on SCr of 1.83 mg/dL (H)). Liver Function Tests: Recent Labs  Lab 04/19/22 1931 04/20/22 0354  AST 20 16  ALT 10 11  ALKPHOS 89 91  BILITOT 1.0 0.6  PROT 7.0 7.3  ALBUMIN 3.1* 3.1*   No results for input(s): "LIPASE", "AMYLASE" in the last 168 hours. No results for input(s): "AMMONIA" in the last 168 hours. Coagulation Profile: No results for input(s): "INR", "PROTIME" in the last 168 hours. Cardiac Enzymes: No results for input(s): "CKTOTAL", "CKMB", "CKMBINDEX", "TROPONINI" in the last 168 hours. BNP (last 3 results) No results for input(s): "PROBNP" in the last 8760 hours. HbA1C: No results for input(s): "HGBA1C" in the last 72 hours. CBG: Recent Labs  Lab 04/20/22 1059 04/20/22 1557 04/20/22 2112 04/21/22 0514 04/21/22 0726  GLUCAP 150* 115* 112* 92 106*   Lipid Profile: No results for input(s): "CHOL", "HDL", "LDLCALC", "TRIG", "CHOLHDL", "LDLDIRECT" in the last 72 hours. Thyroid Function Tests: No results for input(s): "TSH", "T4TOTAL", "FREET4", "T3FREE", "THYROIDAB" in the last 72 hours. Anemia Panel: No results for input(s): "VITAMINB12", "FOLATE", "FERRITIN", "TIBC", "IRON", "RETICCTPCT" in the last 72 hours. Sepsis Labs: No results for input(s): "PROCALCITON", "LATICACIDVEN" in the last 168 hours.  Recent Results (from the past 240 hour(s))  Resp panel by RT-PCR (RSV, Flu A&B, Covid) Anterior Nasal Swab     Status: None   Collection Time: 04/19/22  7:37 PM   Specimen: Anterior Nasal Swab  Result Value Ref  Range Status   SARS Coronavirus 2 by RT PCR NEGATIVE NEGATIVE Final    Comment: (NOTE) SARS-CoV-2 target nucleic acids are NOT DETECTED.  The SARS-CoV-2 RNA is generally detectable in upper respiratory specimens during the acute phase of infection. The lowest concentration of SARS-CoV-2 viral copies this assay can detect is 138 copies/mL. A negative result does not preclude SARS-Cov-2 infection and should not be used as the sole basis for treatment or other patient management decisions. A negative result may occur with  improper specimen collection/handling, submission of specimen other than nasopharyngeal swab, presence of viral mutation(s) within the areas targeted by this assay, and inadequate number of viral copies(<138 copies/mL). A negative result must be combined with clinical observations, patient history, and epidemiological information. The expected result is Negative.  Fact Sheet for Patients:  EntrepreneurPulse.com.au  Fact Sheet for Healthcare Providers:  IncredibleEmployment.be  This test is no t yet approved or cleared by the [place] FDA and  has been authorized for detection and/or diagnosis of SARS-CoV-2 by FDA under an Emergency Use Authorization (EUA). This EUA will remain  in effect (meaning  this test can be used) for the duration of the COVID-19 declaration under Section 564(b)(1) of the Act, 21 U.S.C.section 360bbb-3(b)(1), unless the authorization is terminated  or revoked sooner.       Influenza A by PCR NEGATIVE NEGATIVE Final   Influenza B by PCR NEGATIVE NEGATIVE Final    Comment: (NOTE) The Xpert Xpress SARS-CoV-2/FLU/RSV plus assay is intended as an aid in the diagnosis of influenza from Nasopharyngeal swab specimens and should not be used as a sole basis for treatment. Nasal washings and aspirates are unacceptable for Xpert Xpress SARS-CoV-2/FLU/RSV testing.  Fact Sheet for  Patients: EntrepreneurPulse.com.au  Fact Sheet for Healthcare Providers: IncredibleEmployment.be  This test is not yet approved or cleared by the [place] FDA and has been authorized for detection and/or diagnosis of SARS-CoV-2 by FDA under an Emergency Use Authorization (EUA). This EUA will remain in effect (meaning this test can be used) for the duration of the COVID-19 declaration under Section 564(b)(1) of the Act, 21 U.S.C. section 360bbb-3(b)(1), unless the authorization is terminated or revoked.     Resp Syncytial Virus by PCR NEGATIVE NEGATIVE Final    Comment: (NOTE) Fact Sheet for Patients: EntrepreneurPulse.com.au  Fact Sheet for Healthcare Providers: IncredibleEmployment.be  This test is not yet approved or cleared by the [place] FDA and has been authorized for detection and/or diagnosis of SARS-CoV-2 by FDA under an Emergency Use Authorization (EUA). This EUA will remain in effect (meaning this test can be used) for the duration of the COVID-19 declaration under Section 564(b)(1) of the Act, 21 U.S.C. section 360bbb-3(b)(1), unless the authorization is terminated or revoked.  Performed at [facility], [address], [place], [place] [address]          Radiology Studies: DG Chest 2 View  Result Date: 04/19/2022 CLINICAL DATA:  SOB EXAM: CHEST - 2 VIEW COMPARISON:  02/08/2022 FINDINGS: Enlarged cardiac silhouette. Bibasilar consolidation. Moderate pleural effusion on the right and small pleural effusion on the left. Normal pulmonary vasculature. No pneumothorax. IMPRESSION: Bibasilar consolidation. Enlarged cardiac silhouette. Bilateral pleural effusions. Electronically Signed   By: Sammie Bench M.D.   On: 04/19/2022 19:57        Scheduled Meds:  amiodarone  100 mg Oral Daily   apixaban  5 mg Oral BID   aspirin EC  81 mg Oral Q0600   Chlorhexidine Gluconate Cloth  6 each  Topical Daily   dapagliflozin propanediol  10 mg Oral Daily   insulin aspart  0-9 Units Subcutaneous TID WC   levothyroxine  137 mcg Oral Q0600   nicotine  14 mg Transdermal Daily   pantoprazole  40 mg Oral Daily   potassium chloride  40 mEq Oral BID   rosuvastatin  20 mg Oral Daily   Continuous Infusions:  furosemide (LASIX) 200 mg in dextrose 5 % 100 mL (2 mg/mL) infusion 10 mg/hr (04/21/22 0647)     LOS: 2 days    Time spent: 35 minutes    Lena Gores Darleen Crocker, DO [facility]  If 7PM-7AM, please contact night-coverage [email or web address] 04/21/2022, 9:33 AM

## 2022-04-21 NOTE — Progress Notes (Signed)
Telemetry called stating patient's hr went down to 39, but went back up to the 50s. Patient is resting. MD Manuella Ghazi notified.

## 2022-04-21 NOTE — TOC Progression Note (Signed)
Transition of Care [facility]) - Progression Note    Patient Details  Name: [name] [name] MRN: [id number] Date of Birth: 1955/11/27  [facility] CM/SW Contact  Salome Arnt, [place] Phone Number: 04/21/2022, 1:29 PM  Clinical Narrative: PT evaluated pt and recommend home health. LCSW discussed with pt. He reports he does not feel this is needed right now. Pt aware to notify TOC if he changes his mind.       Expected Discharge Plan: [place] Barriers to Discharge: Continued Medical Work up  Expected Discharge Plan and Services In-house Referral: Clinical Social Work Discharge Planning Services: CM Consult Post Acute Care Choice: [place] arrangements for the past 2 months: Single Family Home                                       Social Determinants of Health (SDOH) Interventions SDOH Screenings   Food Insecurity: No Food Insecurity (04/20/2022)  Housing: Low Risk  (04/20/2022)  Transportation Needs: No Transportation Needs (04/20/2022)  Utilities: Not At Risk (04/20/2022)  Alcohol Screen: Low Risk  (04/07/2022)  Depression (PHQ2-9): High Risk (03/01/2022)  Financial Resource Strain: Medium Risk (04/07/2022)  Physical Activity: Inactive (04/07/2022)  Social Connections: Moderately Isolated (04/07/2022)  Stress: No Stress Concern Present (04/07/2022)  Tobacco Use: High Risk (04/20/2022)    Readmission Risk Interventions    04/20/2022   11:07 AM 02/10/2022    2:42 PM  Readmission Risk Prevention Plan  Transportation Screening Complete Complete  HRI or Home Care Consult Complete Complete  Social Work Consult for [place] Planning/Counseling Complete Complete  Palliative Care Screening Not Applicable Not Applicable  Medication Review Press photographer) Complete Complete

## 2022-04-21 NOTE — Evaluation (Signed)
Physical Therapy Evaluation Patient Details Name: [name] [name] MRN: [id number] DOB: Oct 19, 1955 Today's Date: 04/21/2022  History of Present Illness  [name] [name] is a 67 y.o. male with medical history significant of anxiety, CHF, systolic heart failure, diabetes mellitus, hypertension, and more presents ED with a chief complaint of dyspnea and fatigue.  He was admitted with acute HFrEF with LVEF 20%.  He is noted to have medication noncompliance and has been started on IV Lasix drip for diuresis.  He is on Eliquis for anticoagulation due to apical thrombus and cardiology is following.   Clinical Impression  Pt admitted with above diagnosis. Patient reports unable to feel the bottom of his right foot since his blood clot surgery and that has limited his ability to get around and function quite a bit. Patient given min guard for safety with sit to stand transfer and denied dizziness or unsteady feeling upon standing. Patient ambulated with a slow, somewhat labored cadence with RW using an altered gait pattern due to right lower extremity sensory deficits. Limited by fatigue and complaints of shortness of breath and heart bearing changes. Nurse entered room post-ambulation stating patient had a 10 beat run of V-tach. Pt currently with functional limitations due to the deficits listed below (see PT Problem List). Pt will benefit from acute skilled PT to increase their independence and safety with mobility to allow discharge.          Recommendations for follow up therapy are one component of a multi-disciplinary discharge planning process, led by the attending physician.  Recommendations may be updated based on patient status, additional functional criteria and insurance authorization.  Follow Up Recommendations       Assistance Recommended at Discharge Intermittent Supervision/Assistance  Patient can return home with the following  A little help with walking and/or transfers;A little help  with bathing/dressing/bathroom;Help with stairs or ramp for entrance    Equipment Recommendations    Recommendations for Other Services       Functional Status Assessment Patient has had a recent decline in their functional status and demonstrates the ability to make significant improvements in function in a reasonable and predictable amount of time.     Precautions / Restrictions Precautions Precautions: Fall Precaution Comments: patient reports no falls in the last six months but is unable to feel the bottom of this right for post-surgically Restrictions Weight Bearing Restrictions: No      Mobility  Bed Mobility Overal bed mobility: Modified Independent             General bed mobility comments: increased time    Transfers Overall transfer level: Needs assistance Equipment used: Rolling walker (2 wheels) Transfers: Sit to/from Stand Sit to Stand: Min guard     General transfer comment: min guard for safety; patient denied dizziness or unsteady feeling upon standing.    Ambulation/Gait Ambulation/Gait assistance: Min guard Gait Distance (Feet): 15 Feet Assistive device: Rolling walker (2 wheels) Gait Pattern/deviations: Step-through pattern, Decreased stance time - right, Decreased step length - left, Decreased weight shift to right, Wide base of support, Decreased stride length, Decreased step length - right Gait velocity: decreased     General Gait Details: slow, somewhat labored cadence with RW using altered gait pattern due to right lower extremity sensory deficits. Limited by fatigue and complaints of shortness of breath and heart bearing changes. Nurse entered room post-ambulation stating patient had a 10 beat run of V-tach.  Stairs      Emergency planning/management officer  Modified Rankin (Stroke Patients Only)       Balance Overall balance assessment: Needs assistance   Sitting balance-Leahy Scale: Good     Standing balance support: Bilateral upper  extremity supported, During functional activity, Reliant on assistive device for balance Standing balance-Leahy Scale: Fair Standing balance comment: fair with RW       Pertinent Vitals/Pain Pain Assessment Pain Assessment: 0-10 Pain Score: 9  Pain Location: lower back Pain Descriptors / Indicators: Burning, Throbbing Pain Intervention(s): Limited activity within patient's tolerance, Monitored during session, Repositioned    Home Living Family/patient expects to be discharged to:: Private residence Living Arrangements: Spouse/significant other Available Help at Discharge: Family;Available 24 hours/day Type of Home: House Home Access: Stairs to enter Entrance Stairs-Rails: None Entrance Stairs-Number of Steps: 3   Home Layout: One level Home Equipment: Conservation officer, nature (2 wheels)      Prior Function Prior Level of Function : Driving;Independent/Modified Independent     Mobility Comments: no assistive device to walk short distances; uses RW for longer distances since blot clot surgery and unable to feet bottom of right foot; movement and function have been much more restricted since surgery; mostly sits on couch during the day       Hand Dominance   Dominant Hand: Right    Extremity/Trunk Assessment   Upper Extremity Assessment Upper Extremity Assessment: Overall WFL for tasks assessed    Lower Extremity Assessment Lower Extremity Assessment: Generalized weakness    Cervical / Trunk Assessment Cervical / Trunk Assessment: Normal  Communication   Communication: No difficulties  Cognition Arousal/Alertness: Awake/alert Behavior During Therapy: WFL for tasks assessed/performed Overall Cognitive Status: Within Functional Limits for tasks assessed          General Comments      Exercises     Assessment/Plan    PT Assessment Patient needs continued PT services  PT Problem List Decreased strength;Decreased balance;Decreased mobility;Decreased activity  tolerance;Impaired sensation       PT Treatment Interventions DME instruction;Gait training;Stair training;Therapeutic activities;Therapeutic exercise;Patient/family education;Balance training    PT Goals (Current goals can be found in the Care Plan section)  Acute Rehab PT Goals Patient Stated Goal: Go home. PT Goal Formulation: All assessment and education complete, DC therapy Time For Goal Achievement: 05/05/22 Potential to Achieve Goals: Good    Frequency Min 3X/week        AM-PAC PT "6 Clicks" Mobility  Outcome Measure Help needed turning from your back to your side while in a flat bed without using bedrails?: None Help needed moving from lying on your back to sitting on the side of a flat bed without using bedrails?: None Help needed moving to and from a bed to a chair (including a wheelchair)?: A Little Help needed standing up from a chair using your arms (e.g., wheelchair or bedside chair)?: A Little Help needed to walk in hospital room?: A Little Help needed climbing 3-5 steps with a railing? : A Lot 6 Click Score: 19    End of Session Equipment Utilized During Treatment: Gait belt Activity Tolerance: Patient tolerated treatment well;Patient limited by fatigue Patient left: in bed;with bed alarm set;with call bell/phone within reach Nurse Communication: Mobility status PT Visit Diagnosis: Unsteadiness on feet (R26.81);Other abnormalities of gait and mobility (R26.89);Muscle weakness (generalized) (M62.81)    Time: [id number] PT Time Calculation (min) (ACUTE ONLY): 24 min   Charges:   PT Evaluation $PT Eval Low Complexity: 1 Low PT Treatments $Therapeutic Activity: 8-22 mins  Floria Raveling. Hartnett-Rands, MS, PT Per [place] [phone]  Pamala Hurry  Hartnett-Rands 04/21/2022, 1:29 PM

## 2022-04-21 NOTE — Progress Notes (Signed)
Pt transferred from 2A via wheelchair on tele, standing weight done and charted, pt A/Ox4 and pleasant. Labs showed K+ 3.4 notified Dr. Earnest Conroy order was placed for IV K+, started IV K+ and pt complained it was burning too bad and doesn't want it, IV K+ stopped, lasix continues to infuse, notified Dr. Earnest Conroy pt refuses IV K+.

## 2022-04-21 NOTE — Progress Notes (Signed)
Progress Note  Patient Name: [name] [name] Date of Encounter: 04/21/2022  Primary Cardiologist: Carlyle Dolly, MD  Subjective   No acute events overnight. Patient reported improved SOB.  Intake/Output Summary (Last 24 hours) at 04/21/2022 1041 Last data filed at 04/21/2022 0901 Gross per 24 hour  Intake 1023.63 ml  Output 6425 ml  Net -5401.37 ml     Inpatient Medications    Scheduled Meds:  amiodarone  100 mg Oral Daily   apixaban  5 mg Oral BID   aspirin EC  81 mg Oral Q0600   Chlorhexidine Gluconate Cloth  6 each Topical Daily   dapagliflozin propanediol  10 mg Oral Daily   insulin aspart  0-9 Units Subcutaneous TID WC   levothyroxine  137 mcg Oral Q0600   nicotine  14 mg Transdermal Daily   pantoprazole  40 mg Oral Daily   potassium chloride  40 mEq Oral BID   rosuvastatin  20 mg Oral Daily   Continuous Infusions:  furosemide (LASIX) 200 mg in dextrose 5 % 100 mL (2 mg/mL) infusion 10 mg/hr (04/21/22 0647)   PRN Meds: acetaminophen **OR** acetaminophen, ondansetron **OR** ondansetron (ZOFRAN) IV, oxyCODONE   Vital Signs    Vitals:   04/21/22 0630 04/21/22 0700 04/21/22 0731 04/21/22 0800  BP:  129/69  119/71  Pulse:      Resp: 17 (!) 28  (!) 24  Temp:   97.6 F (36.4 C)   TempSrc:   Oral   SpO2: 100%   100%  Weight:      Height:        Intake/Output Summary (Last 24 hours) at 04/21/2022 1040 Last data filed at 04/21/2022 0901 Gross per 24 hour  Intake 1023.63 ml  Output 6425 ml  Net -5401.37 ml   Filed Weights   04/20/22 0500 04/21/22 0434 04/21/22 0515  Weight: 93.6 kg 92.5 kg 87.8 kg    Telemetry     Personally reviewed, rate controlled and NSR  ECG    Not performed today  Physical Exam   GEN: No acute distress.   Neck: JVD elevated to the angle of mandible Cardiac: RRR, no murmur, rub, or gallop.  Respiratory: Nonlabored. Clear to auscultation bilaterally. GI: Soft, nontender, bowel sounds present. MS: No edema; No  deformity. Neuro:  Nonfocal. Psych: Alert and oriented x 3. Normal affect.  Labs    Chemistry Recent Labs  Lab 04/19/22 1931 04/20/22 0354 04/20/22 1826 04/21/22 0446  NA 138 139 139 140  K 4.5 4.3 3.4* 3.4*  CL 106 105 103 99  CO2 24 23 27 30   GLUCOSE 112* 124* 142* 114*  BUN 25* 25* 28* 27*  CREATININE 1.75* 1.76* 1.99* 1.83*  CALCIUM 8.2* 8.2* 8.0* 8.2*  PROT 7.0 7.3  --   --   ALBUMIN 3.1* 3.1*  --   --   AST 20 16  --   --   ALT 10 11  --   --   ALKPHOS 89 91  --   --   BILITOT 1.0 0.6  --   --   GFRNONAA 43* 42* 37* 40*  ANIONGAP 8 11 9 11      Hematology Recent Labs  Lab 04/19/22 1931 04/20/22 0354 04/21/22 0446  WBC 6.1 6.4 5.1  RBC 3.76* 3.86* 3.59*  HGB 11.9* 12.0* 11.3*  HCT 38.1* 40.0 35.7*  MCV 101.3* 103.6* 99.4  MCH 31.6 31.1 31.5  MCHC 31.2 30.0 31.7  RDW 17.5* 17.2* 17.1*  PLT 265  249 255    Cardiac EnzymesNo results for input(s): "TROPONINIHS" in the last 720 hours.  BNP Recent Labs  Lab 04/19/22 1931 04/21/22 0446  BNP 3,994.0* 3,276.0*     DDimerNo results for input(s): "DDIMER" in the last 168 hours.   Radiology    DG Chest 2 View  Result Date: 04/19/2022 CLINICAL DATA:  SOB EXAM: CHEST - 2 VIEW COMPARISON:  02/08/2022 FINDINGS: Enlarged cardiac silhouette. Bibasilar consolidation. Moderate pleural effusion on the right and small pleural effusion on the left. Normal pulmonary vasculature. No pneumothorax. IMPRESSION: Bibasilar consolidation. Enlarged cardiac silhouette. Bilateral pleural effusions. Electronically Signed   By: Sammie Bench M.D.   On: 04/19/2022 19:57    Cardiac Studies     Assessment & Plan   Patient is a 67 year old M known to have NICM LVEF less than 20% with no device, apical thrombus diagnosed in 01/2022, PAD, stage III CKD presented to the ER with DOE, orthopnea and PND. He is currently admitted to hospitalist team for the management of acute on chronic systolic heart failure exacerbation.   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 Net negative urine output was 5.4L. He continues to have JVD elevated till the angle of mandible. However his labs are improved, chloride decreased from 105 to 99, bicarb increased from 27 to 30. Will increase Lasix drip from 10 mg/h to 15 mg/h due to elevation JVD till angle of mandible (trivial TR, severe right atrial enlargement). Obtain BMP in the afternoon. -Daily weights, 1.2 L fluid restriction, ins and outs -EKG showed normal sinus rhythm, RBBB -Patient interested to follow-up with [facility] cardiology and has transportation issues to go to [place]. Will schedule follow-up in [place] per patient's request.  # Apical mural thrombus diagnosed in 01/2022: Continue Eliquis 5 mg twice daily. Not on Coumadin due to cost noncompliance issues. Will need to repeat limited echocardiogram with contrast in a few months to evaluate for resolution of LV thrombus.  # Paroxysmal A-fib: Continue amiodarone 100 mg once daily, discontinue metoprolol due to severely reduced LV systolic function and continue Eliquis 5 mg twice daily.  # PAD s/p R thromboembolectomy (embolism likely from LV thrombus due to noncompliance with Eliquis) and R TP trunk endarterectomy in 01/2022: Continue aspirin and Eliquis. Follows up with vascular surgery.   I have spent a total of 30 minutes with patient reviewing chart , telemetry, EKGs, labs and examining patient as well as establishing an assessment and plan that was discussed with the patient.  > 50% of time was spent in direct patient care.     Signed, Chalmers Guest, MD  04/21/2022, 10:40 AM

## 2022-04-21 NOTE — Plan of Care (Addendum)
  Problem: Acute Rehab PT Goals(only PT should resolve) Goal: Patient Will Transfer Sit To/From Stand Outcome: Progressing Flowsheets (Taken 04/21/2022 1332) Patient will transfer sit to/from stand: with supervision Goal: Pt Will Transfer Bed To Chair/Chair To Bed Outcome: Progressing Flowsheets (Taken 04/21/2022 1332) Pt will Transfer Bed to Chair/Chair to Bed: with supervision Goal: Pt Will Ambulate Outcome: Progressing Flowsheets (Taken 04/21/2022 1332) Pt will Ambulate:  25 feet  with supervision  with least restrictive assistive device Goal: Pt Will Go Up/Down Stairs Outcome: Progressing Flowsheets (Taken 04/21/2022 1332) Pt will Go Up / Down Stairs:  3-5 stairs  with min guard assist  with rolling walker  with least restrictive assistive device   Pamala Hurry D. Hartnett-Rands, MS, PT Per [place] [phone] 04/21/2022

## 2022-04-22 DIAGNOSIS — I5043 Acute on chronic combined systolic (congestive) and diastolic (congestive) heart failure: Secondary | ICD-10-CM | POA: Diagnosis not present

## 2022-04-22 LAB — GLUCOSE, CAPILLARY
Glucose-Capillary: 112 mg/dL — ABNORMAL HIGH (ref 70–99)
Glucose-Capillary: 170 mg/dL — ABNORMAL HIGH (ref 70–99)
Glucose-Capillary: 150 mg/dL — ABNORMAL HIGH (ref 70–99)
Glucose-Capillary: 152 mg/dL — ABNORMAL HIGH (ref 70–99)

## 2022-04-22 LAB — CBC
HCT: 37.2 % — ABNORMAL LOW (ref 39.0–52.0)
Hemoglobin: 11.9 g/dL — ABNORMAL LOW (ref 13.0–17.0)
MCH: 31.1 pg (ref 26.0–34.0)
MCHC: 32 g/dL (ref 30.0–36.0)
MCV: 97.1 fL (ref 80.0–100.0)
Platelets: 291 10*3/uL (ref 150–400)
RBC: 3.83 MIL/uL — ABNORMAL LOW (ref 4.22–5.81)
RDW: 17.1 % — ABNORMAL HIGH (ref 11.5–15.5)
WBC: 6 10*3/uL (ref 4.0–10.5)
nRBC: 0 % (ref 0.0–0.2)

## 2022-04-22 LAB — BASIC METABOLIC PANEL
Anion gap: 13 (ref 5–15)
BUN: 26 mg/dL — ABNORMAL HIGH (ref 8–23)
CO2: 31 mmol/L (ref 22–32)
Calcium: 8.2 mg/dL — ABNORMAL LOW (ref 8.9–10.3)
Chloride: 94 mmol/L — ABNORMAL LOW (ref 98–111)
Creatinine, Ser: 1.71 mg/dL — ABNORMAL HIGH (ref 0.61–1.24)
GFR, Estimated: 44 mL/min — ABNORMAL LOW (ref >60)
Glucose, Bld: 131 mg/dL — ABNORMAL HIGH (ref 70–99)
Potassium: 3.1 mmol/L — ABNORMAL LOW (ref 3.5–5.1)
Sodium: 138 mmol/L (ref 135–145)

## 2022-04-22 LAB — MAGNESIUM: Magnesium: 1.8 mg/dL (ref 1.7–2.4)

## 2022-04-22 LAB — BRAIN NATRIURETIC PEPTIDE: B Natriuretic Peptide: 3187 pg/mL — ABNORMAL HIGH (ref 0.0–100.0)

## 2022-04-22 MED ORDER — POTASSIUM CHLORIDE CRYS ER 20 MEQ PO TBCR
40.0000 meq | EXTENDED_RELEASE_TABLET | Freq: Three times a day (TID) | ORAL | Status: AC
  Administered 2022-04-22 (×3): 40 meq via ORAL
  Filled 2022-04-22 (×3): qty 2

## 2022-04-22 NOTE — Care Management Important Message (Signed)
Important Message  Patient Details  Name: [name] [name] MRN: [id number] Date of Birth: 1955/12/17   Medicare Important Message Given:  Yes     Tommy Medal 04/22/2022, 11:27 AM

## 2022-04-22 NOTE — Progress Notes (Signed)
Physical Therapy Treatment Patient Details Name: [name] [name] MRN: [id number] DOB: May 28, 1955 Today's Date: 04/22/2022   History of Present Illness [name] [name] is a 67 y.o. male with medical history significant of anxiety, CHF, systolic heart failure, diabetes mellitus, hypertension, and more presents ED with a chief complaint of dyspnea and fatigue.  He was admitted with acute HFrEF with LVEF 20%.  He is noted to have medication noncompliance and has been started on IV Lasix drip for diuresis.  He is on Eliquis for anticoagulation due to apical thrombus and cardiology is following.    PT Comments    Patient does not require assist with bed mobility. He demonstrates good sitting tolerance and sitting balance at EOB. He performs seated exercises and does not wish to ambulate but is agreeable to supine exercise as well. Patient will benefit from continued skilled physical therapy in hospital and recommended venue below to increase strength, balance, endurance for safe ADLs and gait.    Recommendations for follow up therapy are one component of a multi-disciplinary discharge planning process, led by the attending physician.  Recommendations may be updated based on patient status, additional functional criteria and insurance authorization.  Follow Up Recommendations       Assistance Recommended at Discharge Intermittent Supervision/Assistance  Patient can return home with the following A little help with walking and/or transfers;A little help with bathing/dressing/bathroom;Help with stairs or ramp for entrance   Equipment Recommendations  None recommended by PT    Recommendations for Other Services       Precautions / Restrictions Precautions Precautions: Fall Precaution Comments: patient reports no falls in the last six months but is unable to feel the bottom of this right for post-surgically Restrictions Weight Bearing Restrictions: No     Mobility  Bed Mobility Overal bed  mobility: Modified Independent                  Transfers                        Ambulation/Gait                   Stairs             Wheelchair Mobility    Modified Rankin (Stroke Patients Only)       Balance     Sitting balance-Leahy Scale: Good                                      Cognition Arousal/Alertness: Awake/alert Behavior During Therapy: WFL for tasks assessed/performed Overall Cognitive Status: Within Functional Limits for tasks assessed                                          Exercises General Exercises - Lower Extremity Ankle Circles/Pumps: AROM, Both, 10 reps, Supine Long Arc Quad: AROM, Both, 15 reps, Seated Straight Leg Raises: AROM, Both, 10 reps, Supine Hip Flexion/Marching: AROM, Both, 15 reps, Seated Toe Raises: AROM, Both, 15 reps, Seated Heel Raises: AROM, Both, 15 reps, Seated    General Comments        Pertinent Vitals/Pain Pain Assessment Pain Assessment: Faces Faces Pain Scale: Hurts a little bit Pain Location: lower back Pain Descriptors / Indicators: Burning, Throbbing Pain Intervention(s): Limited activity within  patient's tolerance, Monitored during session, Repositioned    Home Living                          Prior Function            PT Goals (current goals can now be found in the care plan section) Acute Rehab PT Goals Patient Stated Goal: Go home. PT Goal Formulation: With patient Time For Goal Achievement: 05/05/22 Potential to Achieve Goals: Good Progress towards PT goals: Progressing toward goals    Frequency    Min 3X/week      PT Plan Current plan remains appropriate    Co-evaluation              AM-PAC PT "6 Clicks" Mobility   Outcome Measure  Help needed turning from your back to your side while in a flat bed without using bedrails?: None Help needed moving from lying on your back to sitting on the side of a flat  bed without using bedrails?: None Help needed moving to and from a bed to a chair (including a wheelchair)?: A Little Help needed standing up from a chair using your arms (e.g., wheelchair or bedside chair)?: A Little Help needed to walk in hospital room?: A Little Help needed climbing 3-5 steps with a railing? : A Lot 6 Click Score: 19    End of Session   Activity Tolerance: Patient tolerated treatment well;Patient limited by fatigue Patient left: in bed;with bed alarm set;with call bell/phone within reach Nurse Communication: Mobility status PT Visit Diagnosis: Unsteadiness on feet (R26.81);Other abnormalities of gait and mobility (R26.89);Muscle weakness (generalized) (M62.81)     Time: [id number] PT Time Calculation (min) (ACUTE ONLY): 22 min  Charges:  $Therapeutic Exercise: 8-22 mins                     1:12 PM, 04/22/22 Mearl Latin PT, DPT Physical Therapist at [facility]

## 2022-04-22 NOTE — Progress Notes (Signed)
Progress Note  Patient Name: [name] [name] Date of Encounter: 04/22/2022  Primary Cardiologist: Carlyle Dolly, MD  Subjective   No acute events overnight. Patient reported improved SOB.  Intake/Output Summary (Last 24 hours) at 04/22/2022 1120 Last data filed at 04/22/2022 0800 Gross per 24 hour  Intake 508.17 ml  Output 6300 ml  Net -5791.83 ml     Inpatient Medications    Scheduled Meds:  amiodarone  100 mg Oral Daily   apixaban  5 mg Oral BID   aspirin EC  81 mg Oral Q0600   dapagliflozin propanediol  10 mg Oral Daily   insulin aspart  0-9 Units Subcutaneous TID WC   levothyroxine  137 mcg Oral Q0600   nicotine  14 mg Transdermal Daily   pantoprazole  40 mg Oral Daily   potassium chloride  40 mEq Oral TID   rosuvastatin  20 mg Oral Daily   Continuous Infusions:  furosemide (LASIX) 200 mg in dextrose 5 % 100 mL (2 mg/mL) infusion 15 mg/hr (04/21/22 2134)   PRN Meds: acetaminophen **OR** acetaminophen, ondansetron **OR** ondansetron (ZOFRAN) IV, oxyCODONE   Vital Signs    Vitals:   04/22/22 0029 04/22/22 0054 04/22/22 0430 04/22/22 0500  BP: 97/60 95/69 105/67   Pulse: (!) 59 65 61   Resp: 18 18 18    Temp: (!) 97 F (36.1 C)  (!) 97.3 F (36.3 C)   TempSrc:      SpO2: (!) 86% 99% 94%   Weight:    81.6 kg  Height:        Intake/Output Summary (Last 24 hours) at 04/22/2022 1120 Last data filed at 04/22/2022 0800 Gross per 24 hour  Intake 508.17 ml  Output 6300 ml  Net -5791.83 ml   Filed Weights   04/21/22 0434 04/21/22 0515 04/22/22 0500  Weight: 92.5 kg 87.8 kg 81.6 kg    Telemetry     Personally reviewed, rate controlled and NSR  ECG    Not performed today  Physical Exam   GEN: No acute distress.   Neck: JVD elevated halfway of the neck Cardiac: RRR, no murmur, rub, or gallop.  Respiratory: Nonlabored. Clear to auscultation bilaterally. GI: Soft, nontender, bowel sounds present, abdomen distention present MS: No edema; No  deformity. Neuro:  Nonfocal. Psych: Alert and oriented x 3. Normal affect.  Labs    Chemistry Recent Labs  Lab 04/19/22 1931 04/20/22 0354 04/20/22 1826 04/21/22 0446 04/21/22 1431 04/22/22 0401  NA 138 139   < > 140 138 138  K 4.5 4.3   < > 3.4* 3.3* 3.1*  CL 106 105   < > 99 94* 94*  CO2 24 23   < > 30 32 31  GLUCOSE 112* 124*   < > 114* 107* 131*  BUN 25* 25*   < > 27* 27* 26*  CREATININE 1.75* 1.76*   < > 1.83* 1.83* 1.71*  CALCIUM 8.2* 8.2*   < > 8.2* 8.3* 8.2*  PROT 7.0 7.3  --   --   --   --   ALBUMIN 3.1* 3.1*  --   --   --   --   AST 20 16  --   --   --   --   ALT 10 11  --   --   --   --   ALKPHOS 89 91  --   --   --   --   BILITOT 1.0 0.6  --   --   --   --  GFRNONAA 43* 42*   < > 40* 40* 44*  ANIONGAP 8 11   < > 11 12 13    < > = values in this interval not displayed.     Hematology Recent Labs  Lab 04/20/22 0354 04/21/22 0446 04/22/22 0401  WBC 6.4 5.1 6.0  RBC 3.86* 3.59* 3.83*  HGB 12.0* 11.3* 11.9*  HCT 40.0 35.7* 37.2*  MCV 103.6* 99.4 97.1  MCH 31.1 31.5 31.1  MCHC 30.0 31.7 32.0  RDW 17.2* 17.1* 17.1*  PLT 249 255 291    Cardiac EnzymesNo results for input(s): "TROPONINIHS" in the last 720 hours.  BNP Recent Labs  Lab 04/19/22 1931 04/21/22 0446 04/22/22 0401  BNP 3,994.0* 3,276.0* 3,187.0*     DDimerNo results for input(s): "DDIMER" in the last 168 hours.   Radiology    No results found.  Assessment & Plan   Patient is a 67 year old M known to have NICM LVEF less than 20% with no device, apical thrombus diagnosed in 01/2022, PAD, stage III CKD presented to the ER with DOE, orthopnea and PND. He is currently admitted to hospitalist team for the management of acute on chronic systolic heart failure exacerbation.   # Acute on chronic systolic heart failure exacerbation # NICM LVEF less than 20% with no device -Patient reported improved SOB compared to yesterday but not back to baseline. Net negative urine output was 5.8L. JVD  elevated till half of the neck, approximately 12-13 mm Hg. Labs are improving. Will continue Lasix drip at 15 mg/h and obtain BMP in the afternoon. -Daily weights, 1.2 L fluid restriction, ins and outs -EKG showed normal sinus rhythm, RBBB -Patient interested to follow-up with [facility] cardiology and has transportation issues to go to [place]. Will schedule follow-up in [place] per patient's request.  # Apical mural thrombus diagnosed in 01/2022: Continue Eliquis 5 mg twice daily.  Not on Coumadin due to cost noncompliance issues.  Will need to repeat limited echocardiogram with contrast in a few months to evaluate for resolution of LV thrombus.  # Paroxysmal A-fib: Continue amiodarone 100 mg once daily, discontinued metoprolol due to severe reduced LV systolic function and continue Eliquis 5 mg twice daily.  # PAD s/p R thromboembolectomy (embolism likely from LV thrombus due to noncompliance with Eliquis) and R TP trunk endarterectomy in 01/2022: Continue aspirin and Eliquis.  Follows up with vascular surgery.  I have spent a total of 30 minutes with patient reviewing chart , telemetry, EKGs, labs and examining patient as well as establishing an assessment and plan that was discussed with the patient.  > 50% of time was spent in direct patient care.     Signed, Chalmers Guest, MD  04/22/2022, 11:20 AM

## 2022-04-22 NOTE — Progress Notes (Signed)
PROGRESS NOTE    [name] [name]  [id number] DOB: 1956/01/12 DOA: 04/19/2022 PCP: Juanda Chance, FNP   Brief Narrative:    [name] [name] is a 67 y.o. [name] with medical history significant of anxiety, CHF, systolic heart failure, diabetes mellitus, hypertension, and more presents ED with a chief complaint of dyspnea and fatigue.  He was admitted with acute HFrEF with LVEF 20%.  He is noted to have medication noncompliance and has been started on IV Lasix drip for diuresis.  He is on Eliquis for anticoagulation due to apical thrombus and cardiology is following.  Assessment & Plan:   Principal Problem:   Acute on chronic combined systolic and diastolic CHF (congestive heart failure) Active Problems:   Type 2 diabetes with nephropathy   Hyperlipidemia   Chronic pain   Acute respiratory failure with hypoxia   Mural thrombus of cardiac apex   Tobacco use disorder   History of DVT (deep vein thrombosis)   Hypothyroidism   PAD (peripheral artery disease)   NICM (nonischemic cardiomyopathy)  Assessment and Plan:  Acute hypoxemic respiratory failure secondary to acute on chronic systolic (congestive) heart failure - Last echo shows an ejection fraction of less than 20% and grade 3 diastolic dysfunction - Appreciate cardiology recommendations with ongoing IV Lasix drip -Monitor daily weights and strict I's and O's -Weaned off of oxygen at this time  Apical mural thrombus -Continue Eliquis twice daily -He will require repeat echo in a few months to evaluate for resolution of thrombus  Paroxysmal atrial fibrillation -Continue amiodarone 100 mg once daily and metoprolol discontinued due to severely reduced LV function -Continue Eliquis 5 mg twice daily for anticoagulation  PAD status post right thromboembolectomy and right TP trunk endarterectomy 01/2022 -Likely due to noncompliance with Eliquis/LV thrombus -Continue aspirin and Eliquis -Follow-up with vascular  surgery  Mild hypokalemia -Continue to replete orally in the setting of aggressive diuresis -Close monitoring in a.m.  Hypothyroidism - Continue Synthroid  Tobacco use disorder - Smokes half a pack per day - Nicotine patch ordered - Counseled on importance of cessation  Chronic pain - Pain control with pain scale  Hyperlipidemia - Continue statin  Type 2 diabetes with nephropathy - Documented history of diabetes mellitus - Continue Farxiga - Sliding scale coverage - Glucose well-controlled at 112  DVT prophylaxis:Eliquis Code Status: Full Family Communication: None at bedside Disposition Plan:  Status is: Inpatient Remains inpatient appropriate because: Need for IV medications.  Consultants:  Cardiology  Procedures:  None  Antimicrobials:  None   Subjective: Patient seen and evaluated today with no new acute complaints or concerns. No acute concerns or events noted overnight.  He has been urinating frequently.  Objective: Vitals:   04/22/22 0029 04/22/22 0054 04/22/22 0430 04/22/22 0500  BP: 97/60 95/69 105/67   Pulse: (!) 59 65 61   Resp: 18 18 18    Temp: (!) 97 F (36.1 C)  (!) 97.3 F (36.3 C)   TempSrc:      SpO2: (!) 86% 99% 94%   Weight:    81.6 kg  Height:        Intake/Output Summary (Last 24 hours) at 04/22/2022 0955 Last data filed at 04/22/2022 0800 Gross per 24 hour  Intake 508.17 ml  Output 7000 ml  Net -6491.83 ml   Filed Weights   04/21/22 0434 04/21/22 0515 04/22/22 0500  Weight: 92.5 kg 87.8 kg 81.6 kg    Examination:  General exam: Appears calm and comfortable  Respiratory system: Clear to auscultation. Respiratory effort normal. Cardiovascular system: S1 & S2 heard, RRR.  Gastrointestinal system: Abdomen is soft Central nervous system: Alert and awake Extremities: No edema Skin: No significant lesions noted Psychiatry: Flat affect.    Data Reviewed: I have personally reviewed following labs and imaging  studies  CBC: Recent Labs  Lab 04/19/22 1931 04/20/22 0354 04/21/22 0446 04/22/22 0401  WBC 6.1 6.4 5.1 6.0  NEUTROABS 3.9 3.9  --   --   HGB 11.9* 12.0* 11.3* 11.9*  HCT 38.1* 40.0 35.7* 37.2*  MCV 101.3* 103.6* 99.4 97.1  PLT 265 249 255 [id number]   Basic Metabolic Panel: Recent Labs  Lab 04/20/22 0354 04/20/22 1826 04/21/22 0446 04/21/22 1431 04/22/22 0401  NA 139 139 140 138 138  K 4.3 3.4* 3.4* 3.3* 3.1*  CL 105 103 99 94* 94*  CO2 23 27 30  32 31  GLUCOSE 124* 142* 114* 107* 131*  BUN 25* 28* 27* 27* 26*  CREATININE 1.76* 1.99* 1.83* 1.83* 1.71*  CALCIUM 8.2* 8.0* 8.2* 8.3* 8.2*  MG 2.2  --   --  1.9 1.8   GFR: Estimated Creatinine Clearance: 47.3 mL/min (A) (by C-G formula based on SCr of 1.71 mg/dL (H)). Liver Function Tests: Recent Labs  Lab 04/19/22 1931 04/20/22 0354  AST 20 16  ALT 10 11  ALKPHOS 89 91  BILITOT 1.0 0.6  PROT 7.0 7.3  ALBUMIN 3.1* 3.1*   No results for input(s): "LIPASE", "AMYLASE" in the last 168 hours. No results for input(s): "AMMONIA" in the last 168 hours. Coagulation Profile: No results for input(s): "INR", "PROTIME" in the last 168 hours. Cardiac Enzymes: No results for input(s): "CKTOTAL", "CKMB", "CKMBINDEX", "TROPONINI" in the last 168 hours. BNP (last 3 results) No results for input(s): "PROBNP" in the last 8760 hours. HbA1C: No results for input(s): "HGBA1C" in the last 72 hours. CBG: Recent Labs  Lab 04/21/22 0726 04/21/22 1106 04/21/22 1608 04/21/22 2018 04/22/22 0739  GLUCAP 106* 148* 138* 124* 112*   Lipid Profile: No results for input(s): "CHOL", "HDL", "LDLCALC", "TRIG", "CHOLHDL", "LDLDIRECT" in the last 72 hours. Thyroid Function Tests: No results for input(s): "TSH", "T4TOTAL", "FREET4", "T3FREE", "THYROIDAB" in the last 72 hours. Anemia Panel: No results for input(s): "VITAMINB12", "FOLATE", "FERRITIN", "TIBC", "IRON", "RETICCTPCT" in the last 72 hours. Sepsis Labs: No results for input(s):  "PROCALCITON", "LATICACIDVEN" in the last 168 hours.  Recent Results (from the past 240 hour(s))  Resp panel by RT-PCR (RSV, Flu A&B, Covid) Anterior Nasal Swab     Status: None   Collection Time: 04/19/22  7:37 PM   Specimen: Anterior Nasal Swab  Result Value Ref Range Status   SARS Coronavirus 2 by RT PCR NEGATIVE NEGATIVE Final    Comment: (NOTE) SARS-CoV-2 target nucleic acids are NOT DETECTED.  The SARS-CoV-2 RNA is generally detectable in upper respiratory specimens during the acute phase of infection. The lowest concentration of SARS-CoV-2 viral copies this assay can detect is 138 copies/mL. A negative result does not preclude SARS-Cov-2 infection and should not be used as the sole basis for treatment or other patient management decisions. A negative result may occur with  improper specimen collection/handling, submission of specimen other than nasopharyngeal swab, presence of viral mutation(s) within the areas targeted by this assay, and inadequate number of viral copies(<138 copies/mL). A negative result must be combined with clinical observations, patient history, and epidemiological information. The expected result is Negative.  Fact Sheet for Patients:  EntrepreneurPulse.com.au  Fact Sheet  for Healthcare Providers:  IncredibleEmployment.be  This test is no t yet approved or cleared by the [place] and  has been authorized for detection and/or diagnosis of SARS-CoV-2 by FDA under an Emergency Use Authorization (EUA). This EUA will remain  in effect (meaning this test can be used) for the duration of the COVID-19 declaration under Section 564(b)(1) of the Act, 21 U.S.C.section 360bbb-3(b)(1), unless the authorization is terminated  or revoked sooner.       Influenza A by PCR NEGATIVE NEGATIVE Final   Influenza B by PCR NEGATIVE NEGATIVE Final    Comment: (NOTE) The Xpert Xpress SARS-CoV-2/FLU/RSV plus assay is intended  as an aid in the diagnosis of influenza from Nasopharyngeal swab specimens and should not be used as a sole basis for treatment. Nasal washings and aspirates are unacceptable for Xpert Xpress SARS-CoV-2/FLU/RSV testing.  Fact Sheet for Patients: EntrepreneurPulse.com.au  Fact Sheet for Healthcare Providers: IncredibleEmployment.be  This test is not yet approved or cleared by the [place] FDA and has been authorized for detection and/or diagnosis of SARS-CoV-2 by FDA under an Emergency Use Authorization (EUA). This EUA will remain in effect (meaning this test can be used) for the duration of the COVID-19 declaration under Section 564(b)(1) of the Act, 21 U.S.C. section 360bbb-3(b)(1), unless the authorization is terminated or revoked.     Resp Syncytial Virus by PCR NEGATIVE NEGATIVE Final    Comment: (NOTE) Fact Sheet for Patients: EntrepreneurPulse.com.au  Fact Sheet for Healthcare Providers: IncredibleEmployment.be  This test is not yet approved or cleared by the [place] FDA and has been authorized for detection and/or diagnosis of SARS-CoV-2 by FDA under an Emergency Use Authorization (EUA). This EUA will remain in effect (meaning this test can be used) for the duration of the COVID-19 declaration under Section 564(b)(1) of the Act, 21 U.S.C. section 360bbb-3(b)(1), unless the authorization is terminated or revoked.  Performed at [facility], [address], [place], [place] [address]   MRSA Next Gen by PCR, Nasal     Status: None   Collection Time: 04/21/22  6:00 AM   Specimen: Nasal Mucosa; Nasal Swab  Result Value Ref Range Status   MRSA by PCR Next Gen NOT DETECTED NOT DETECTED Final    Comment: (NOTE) The GeneXpert MRSA Assay (FDA approved for NASAL specimens only), is one component of a comprehensive MRSA colonization surveillance program. It is not intended to diagnose MRSA  infection nor to guide or monitor treatment for MRSA infections. Test performance is not FDA approved in patients less than 66 years old. Performed at [facility], [address] [place], [place] [address]          Radiology Studies: No results found.      Scheduled Meds:  amiodarone  100 mg Oral Daily   apixaban  5 mg Oral BID   aspirin EC  81 mg Oral Q0600   dapagliflozin propanediol  10 mg Oral Daily   insulin aspart  0-9 Units Subcutaneous TID WC   levothyroxine  137 mcg Oral Q0600   nicotine  14 mg Transdermal Daily   pantoprazole  40 mg Oral Daily   potassium chloride  40 mEq Oral TID   rosuvastatin  20 mg Oral Daily   Continuous Infusions:  furosemide (LASIX) 200 mg in dextrose 5 % 100 mL (2 mg/mL) infusion 15 mg/hr (04/21/22 2134)     LOS: 3 days    Time spent: 35 minutes    Kortnee Bas Darleen Crocker, DO [facility]  If 7PM-7AM, please contact night-coverage [email or web address] 04/22/2022, 9:55 AM

## 2022-04-23 DIAGNOSIS — I5043 Acute on chronic combined systolic (congestive) and diastolic (congestive) heart failure: Secondary | ICD-10-CM | POA: Diagnosis not present

## 2022-04-23 LAB — CBC
HCT: 45.4 % (ref 39.0–52.0)
Hemoglobin: 14.3 g/dL (ref 13.0–17.0)
MCH: 31 pg (ref 26.0–34.0)
MCHC: 31.5 g/dL (ref 30.0–36.0)
MCV: 98.3 fL (ref 80.0–100.0)
Platelets: 295 10*3/uL (ref 150–400)
RBC: 4.62 MIL/uL (ref 4.22–5.81)
RDW: 16.6 % — ABNORMAL HIGH (ref 11.5–15.5)
WBC: 6 10*3/uL (ref 4.0–10.5)
nRBC: 0 % (ref 0.0–0.2)

## 2022-04-23 LAB — BASIC METABOLIC PANEL
Anion gap: 14 (ref 5–15)
BUN: 30 mg/dL — ABNORMAL HIGH (ref 8–23)
CO2: 35 mmol/L — ABNORMAL HIGH (ref 22–32)
Calcium: 9 mg/dL (ref 8.9–10.3)
Chloride: 90 mmol/L — ABNORMAL LOW (ref 98–111)
Creatinine, Ser: 1.79 mg/dL — ABNORMAL HIGH (ref 0.61–1.24)
GFR, Estimated: 42 mL/min — ABNORMAL LOW (ref >60)
Glucose, Bld: 115 mg/dL — ABNORMAL HIGH (ref 70–99)
Potassium: 3.8 mmol/L (ref 3.5–5.1)
Sodium: 139 mmol/L (ref 135–145)

## 2022-04-23 LAB — BRAIN NATRIURETIC PEPTIDE: B Natriuretic Peptide: 1260 pg/mL — ABNORMAL HIGH (ref 0.0–100.0)

## 2022-04-23 LAB — GLUCOSE, CAPILLARY
Glucose-Capillary: 195 mg/dL — ABNORMAL HIGH (ref 70–99)
Glucose-Capillary: 170 mg/dL — ABNORMAL HIGH (ref 70–99)
Glucose-Capillary: 120 mg/dL — ABNORMAL HIGH (ref 70–99)
Glucose-Capillary: 169 mg/dL — ABNORMAL HIGH (ref 70–99)

## 2022-04-23 LAB — MAGNESIUM: Magnesium: 1.8 mg/dL (ref 1.7–2.4)

## 2022-04-23 MED ORDER — SACUBITRIL-VALSARTAN 24-26 MG PO TABS
1.0000 | ORAL_TABLET | Freq: Two times a day (BID) | ORAL | Status: DC
  Administered 2022-04-23 – 2022-04-24 (×3): 1 via ORAL
  Filled 2022-04-23 (×3): qty 1

## 2022-04-23 MED ORDER — FUROSEMIDE 10 MG/ML IJ SOLN
80.0000 mg | Freq: Two times a day (BID) | INTRAMUSCULAR | Status: DC
  Administered 2022-04-23: 80 mg via INTRAVENOUS
  Filled 2022-04-23: qty 8

## 2022-04-23 NOTE — Progress Notes (Signed)
PROGRESS NOTE    [name] [name]  JKD:[id number] DOB: 1955/10/06 DOA: 04/19/2022 PCP: Rica Records, FNP   Brief Narrative:    [name] [name] is a 66 y.o. male with medical history significant of anxiety, CHF, systolic heart failure, diabetes mellitus, hypertension, and more presents ED with a chief complaint of dyspnea and fatigue.  He was admitted with acute HFrEF with LVEF 20%.  He is noted to have medication noncompliance and has been started on IV Lasix drip for diuresis.  He is on Eliquis for anticoagulation due to apical thrombus and cardiology is following.  Assessment & Plan:   Principal Problem:   Acute on chronic combined systolic and diastolic CHF (congestive heart failure) Active Problems:   Type 2 diabetes with nephropathy   Hyperlipidemia   Chronic pain   Acute respiratory failure with hypoxia   Apical mural thrombus   Tobacco use disorder   History of DVT (deep vein thrombosis)   Hypothyroidism   PAD (peripheral artery disease)   Nonischemic cardiomyopathy  Assessment and Plan:  Acute hypoxemic respiratory failure secondary to acute on chronic systolic (congestive) heart failure - Last echo shows an ejection fraction of less than 20% and grade 3 diastolic dysfunction - Appreciate cardiology recommendations with ongoing IV Lasix now changed to twice a day dosing -Monitor daily weights and strict I's and O's -Weaned off of oxygen at this time  Apical mural thrombus -Continue Eliquis twice daily -He will require repeat echo in a few months to evaluate for resolution of thrombus  Paroxysmal atrial fibrillation -Continue amiodarone 100 mg once daily and metoprolol discontinued due to severely reduced LV function -Continue Eliquis 5 mg twice daily for anticoagulation  PAD status post right thromboembolectomy and right TP trunk endarterectomy 01/2022 -Likely due to noncompliance with Eliquis/LV thrombus -Continue aspirin and Eliquis -Follow-up with  vascular surgery  Hypothyroidism - Continue Synthroid  Tobacco use disorder - Smokes half a pack per day - Nicotine patch ordered - Counseled on importance of cessation  Chronic pain - Pain control with pain scale  Hyperlipidemia - Continue statin  Type 2 diabetes with nephropathy - Documented history of diabetes mellitus - Continue Farxiga - Sliding scale coverage - Glucose well-controlled at 112  DVT prophylaxis:Eliquis Code Status: Full Family Communication: None at bedside Disposition Plan:  Status is: Inpatient Remains inpatient appropriate because: Need for IV medications.  Consultants:  Cardiology  Procedures:  None  Antimicrobials:  None   Subjective: Patient seen and evaluated today with no new acute complaints or concerns. No acute concerns or events noted overnight.  He has been urinating frequently.  Objective: Vitals:   04/22/22 1419 04/22/22 2019 04/23/22 0019 04/23/22 0626  BP: (!) 100/53 96/75  102/71  Pulse: (!) 56 (!) 51  62  Resp:  20  18  Temp: 98.1 F (36.7 C) 97.9 F (36.6 C)  98 F (36.7 C)  TempSrc: Oral Oral  Oral  SpO2: 93% 99%  99%  Weight:   81.8 kg   Height:        Intake/Output Summary (Last 24 hours) at 04/23/2022 1052 Last data filed at 04/23/2022 0900 Gross per 24 hour  Intake 600 ml  Output 4600 ml  Net -4000 ml   Filed Weights   04/21/22 0515 04/22/22 0500 04/23/22 0019  Weight: 87.8 kg 81.6 kg 81.8 kg    Examination:  General exam: Appears calm and comfortable  Respiratory system: Clear to auscultation. Respiratory effort normal. Cardiovascular system: S1 &  S2 heard, RRR.  Gastrointestinal system: Abdomen is soft Central nervous system: Alert and awake Extremities: No edema Skin: No significant lesions noted Psychiatry: Flat affect.    Data Reviewed: I have personally reviewed following labs and imaging studies  CBC: Recent Labs  Lab 04/19/22 1931 04/20/22 0354 04/21/22 0446 04/22/22 0401  04/23/22 0638  WBC 6.1 6.4 5.1 6.0 6.0  NEUTROABS 3.9 3.9  --   --   --   HGB 11.9* 12.0* 11.3* 11.9* 14.3  HCT 38.1* 40.0 35.7* 37.2* 45.4  MCV 101.3* 103.6* 99.4 97.1 98.3  PLT 265 249 255 291 295   Basic Metabolic Panel: Recent Labs  Lab 04/20/22 0354 04/20/22 1826 04/21/22 0446 04/21/22 1431 04/22/22 0401 04/23/22 0638  NA 139 139 140 138 138 139  K 4.3 3.4* 3.4* 3.3* 3.1* 3.8  CL 105 103 99 94* 94* 90*  CO2 23 27 30  32 31 35*  GLUCOSE 124* 142* 114* 107* 131* 115*  BUN 25* 28* 27* 27* 26* 30*  CREATININE 1.76* 1.99* 1.83* 1.83* 1.71* 1.79*  CALCIUM 8.2* 8.0* 8.2* 8.3* 8.2* 9.0  MG 2.2  --   --  1.9 1.8 1.8   GFR: Estimated Creatinine Clearance: 45.2 mL/min (A) (by C-G formula based on SCr of 1.79 mg/dL (H)). Liver Function Tests: Recent Labs  Lab 04/19/22 1931 04/20/22 0354  AST 20 16  ALT 10 11  ALKPHOS 89 91  BILITOT 1.0 0.6  PROT 7.0 7.3  ALBUMIN 3.1* 3.1*   No results for input(s): "LIPASE", "AMYLASE" in the last 168 hours. No results for input(s): "AMMONIA" in the last 168 hours. Coagulation Profile: No results for input(s): "INR", "PROTIME" in the last 168 hours. Cardiac Enzymes: No results for input(s): "CKTOTAL", "CKMB", "CKMBINDEX", "TROPONINI" in the last 168 hours. BNP (last 3 results) No results for input(s): "PROBNP" in the last 8760 hours. HbA1C: No results for input(s): "HGBA1C" in the last 72 hours. CBG: Recent Labs  Lab 04/22/22 0739 04/22/22 1115 04/22/22 1633 04/22/22 2104 04/23/22 0742  GLUCAP 112* 170* 150* 152* 195*   Lipid Profile: No results for input(s): "CHOL", "HDL", "LDLCALC", "TRIG", "CHOLHDL", "LDLDIRECT" in the last 72 hours. Thyroid Function Tests: No results for input(s): "TSH", "T4TOTAL", "FREET4", "T3FREE", "THYROIDAB" in the last 72 hours. Anemia Panel: No results for input(s): "VITAMINB12", "FOLATE", "FERRITIN", "TIBC", "IRON", "RETICCTPCT" in the last 72 hours. Sepsis Labs: No results for input(s):  "PROCALCITON", "LATICACIDVEN" in the last 168 hours.  Recent Results (from the past 240 hour(s))  Resp panel by RT-PCR (RSV, Flu A&B, Covid) Anterior Nasal Swab     Status: None   Collection Time: 04/19/22  7:37 PM   Specimen: Anterior Nasal Swab  Result Value Ref Range Status   SARS Coronavirus 2 by RT PCR NEGATIVE NEGATIVE Final    Comment: (NOTE) SARS-CoV-2 target nucleic acids are NOT DETECTED.  The SARS-CoV-2 RNA is generally detectable in upper respiratory specimens during the acute phase of infection. The lowest concentration of SARS-CoV-2 viral copies this assay can detect is 138 copies/mL. A negative result does not preclude SARS-Cov-2 infection and should not be used as the sole basis for treatment or other patient management decisions. A negative result may occur with  improper specimen collection/handling, submission of specimen other than nasopharyngeal swab, presence of viral mutation(s) within the areas targeted by this assay, and inadequate number of viral copies(<138 copies/mL). A negative result must be combined with clinical observations, patient history, and epidemiological information. The expected result is Negative.  Fact Sheet for Patients:  BloggerCourse.com  Fact Sheet for Healthcare Providers:  SeriousBroker.it  This test is no t yet approved or cleared by the [place] FDA and  has been authorized for detection and/or diagnosis of SARS-CoV-2 by FDA under an Emergency Use Authorization (EUA). This EUA will remain  in effect (meaning this test can be used) for the duration of the COVID-19 declaration under Section 564(b)(1) of the Act, 21 U.S.C.section 360bbb-3(b)(1), unless the authorization is terminated  or revoked sooner.       Influenza A by PCR NEGATIVE NEGATIVE Final   Influenza B by PCR NEGATIVE NEGATIVE Final    Comment: (NOTE) The Xpert Xpress SARS-CoV-2/FLU/RSV plus assay is intended  as an aid in the diagnosis of influenza from Nasopharyngeal swab specimens and should not be used as a sole basis for treatment. Nasal washings and aspirates are unacceptable for Xpert Xpress SARS-CoV-2/FLU/RSV testing.  Fact Sheet for Patients: BloggerCourse.com  Fact Sheet for Healthcare Providers: SeriousBroker.it  This test is not yet approved or cleared by the [place] FDA and has been authorized for detection and/or diagnosis of SARS-CoV-2 by FDA under an Emergency Use Authorization (EUA). This EUA will remain in effect (meaning this test can be used) for the duration of the COVID-19 declaration under Section 564(b)(1) of the Act, 21 U.S.C. section 360bbb-3(b)(1), unless the authorization is terminated or revoked.     Resp Syncytial Virus by PCR NEGATIVE NEGATIVE Final    Comment: (NOTE) Fact Sheet for Patients: BloggerCourse.com  Fact Sheet for Healthcare Providers: SeriousBroker.it  This test is not yet approved or cleared by the [place] FDA and has been authorized for detection and/or diagnosis of SARS-CoV-2 by FDA under an Emergency Use Authorization (EUA). This EUA will remain in effect (meaning this test can be used) for the duration of the COVID-19 declaration under Section 564(b)(1) of the Act, 21 U.S.C. section 360bbb-3(b)(1), unless the authorization is terminated or revoked.  Performed at [facility], [address], [place], [place] [address]   MRSA Next Gen by PCR, Nasal     Status: None   Collection Time: 04/21/22  6:00 AM   Specimen: Nasal Mucosa; Nasal Swab  Result Value Ref Range Status   MRSA by PCR Next Gen NOT DETECTED NOT DETECTED Final    Comment: (NOTE) The GeneXpert MRSA Assay (FDA approved for NASAL specimens only), is one component of a comprehensive MRSA colonization surveillance program. It is not intended to diagnose MRSA  infection nor to guide or monitor treatment for MRSA infections. Test performance is not FDA approved in patients less than 61 years old. Performed at [facility], [address] [place], [place] [address]          Radiology Studies: No results found.      Scheduled Meds:  amiodarone  100 mg Oral Daily   apixaban  5 mg Oral BID   aspirin EC  81 mg Oral Q0600   dapagliflozin propanediol  10 mg Oral Daily   furosemide  80 mg Intravenous BID   insulin aspart  0-9 Units Subcutaneous TID WC   levothyroxine  137 mcg Oral Q0600   nicotine  14 mg Transdermal Daily   pantoprazole  40 mg Oral Daily   rosuvastatin  20 mg Oral Daily      LOS: 4 days    Time spent: 35 minutes    Sharnette Kitamura Hoover Brunette, DO [facility]  If 7PM-7AM, please contact night-coverage [email or web address] 04/23/2022, 10:52 AM

## 2022-04-23 NOTE — Progress Notes (Signed)
Progress Note  Patient Name: [name] [name] Date of Encounter: 04/23/2022  Primary Cardiologist: Dina Rich, MD  Subjective   No acute events overnight. Patient reported improved SOB.  Intake/Output Summary (Last 24 hours) at 04/23/2022 1116 Last data filed at 04/23/2022 0900 Gross per 24 hour  Intake 600 ml  Output 3600 ml  Net -3000 ml     Inpatient Medications    Scheduled Meds:  amiodarone  100 mg Oral Daily   apixaban  5 mg Oral BID   aspirin EC  81 mg Oral Q0600   dapagliflozin propanediol  10 mg Oral Daily   furosemide  80 mg Intravenous BID   insulin aspart  0-9 Units Subcutaneous TID WC   levothyroxine  137 mcg Oral Q0600   nicotine  14 mg Transdermal Daily   pantoprazole  40 mg Oral Daily   rosuvastatin  20 mg Oral Daily   Continuous Infusions:   PRN Meds: acetaminophen **OR** acetaminophen, ondansetron **OR** ondansetron (ZOFRAN) IV, oxyCODONE   Vital Signs    Vitals:   04/22/22 1419 04/22/22 2019 04/23/22 0019 04/23/22 0626  BP: (!) 100/53 96/75  102/71  Pulse: (!) 56 (!) 51  62  Resp:  20  18  Temp: 98.1 F (36.7 C) 97.9 F (36.6 C)  98 F (36.7 C)  TempSrc: Oral Oral  Oral  SpO2: 93% 99%  99%  Weight:   81.8 kg   Height:        Intake/Output Summary (Last 24 hours) at 04/23/2022 1116 Last data filed at 04/23/2022 0900 Gross per 24 hour  Intake 600 ml  Output 3600 ml  Net -3000 ml   Filed Weights   04/21/22 0515 04/22/22 0500 04/23/22 0019  Weight: 87.8 kg 81.6 kg 81.8 kg    Telemetry     Personally reviewed, rate controlled and NSR  ECG    Not performed today  Physical Exam   GEN: No acute distress.   Neck: JVD at the base of the neck Cardiac: RRR, no murmur, rub, or gallop.  Respiratory: Nonlabored. Clear to auscultation bilaterally. GI: Soft, nontender, bowel sounds present, abdomen distention present MS: No edema; No deformity. Neuro:  Nonfocal. Psych: Alert and oriented x 3. Normal affect.  Labs     Chemistry Recent Labs  Lab 04/19/22 1931 04/20/22 0354 04/20/22 1826 04/21/22 1431 04/22/22 0401 04/23/22 0638  NA 138 139   < > 138 138 139  K 4.5 4.3   < > 3.3* 3.1* 3.8  CL 106 105   < > 94* 94* 90*  CO2 24 23   < > 32 31 35*  GLUCOSE 112* 124*   < > 107* 131* 115*  BUN 25* 25*   < > 27* 26* 30*  CREATININE 1.75* 1.76*   < > 1.83* 1.71* 1.79*  CALCIUM 8.2* 8.2*   < > 8.3* 8.2* 9.0  PROT 7.0 7.3  --   --   --   --   ALBUMIN 3.1* 3.1*  --   --   --   --   AST 20 16  --   --   --   --   ALT 10 11  --   --   --   --   ALKPHOS 89 91  --   --   --   --   BILITOT 1.0 0.6  --   --   --   --   GFRNONAA 43* 42*   < >  40* 44* 42*  ANIONGAP 8 11   < > 12 13 14    < > = values in this interval not displayed.     Hematology Recent Labs  Lab 04/21/22 0446 04/22/22 0401 04/23/22 0638  WBC 5.1 6.0 6.0  RBC 3.59* 3.83* 4.62  HGB 11.3* 11.9* 14.3  HCT 35.7* 37.2* 45.4  MCV 99.4 97.1 98.3  MCH 31.5 31.1 31.0  MCHC 31.7 32.0 31.5  RDW 17.1* 17.1* 16.6*  PLT 255 291 295    Cardiac EnzymesNo results for input(s): "TROPONINIHS" in the last 720 hours.  BNP Recent Labs  Lab 04/21/22 0446 04/22/22 0401 04/23/22 0638  BNP 3,276.0* 3,187.0* 1,260.0*     DDimerNo results for input(s): "DDIMER" in the last 168 hours.   Radiology    No results found.  Assessment & Plan   Patient is a 67 year old M known to have NICM LVEF less than 20% with no device, apical thrombus diagnosed in 01/2022, PAD, stage III CKD presented to the ER with DOE, orthopnea and PND. He is currently admitted to hospitalist team for the management of acute on chronic systolic heart failure exacerbation.   # Acute on chronic systolic heart failure exacerbation, near compensated # NICM LVEF less than 20% with no device -JVD at the base of the neck, will switch to Lasix drip to IV Lasix 80 mg twice daily. He will need p.o. Lasix 40 mg twice daily upon discharge and discontinue home torsemide. -Start Entresto  24-26 mg twice daily -Continue Farxiga 10 mg once daily -Daily weights, 1.2 L fluid restriction, ins and outs -EKG showed normal sinus rhythm, RBBB -Patient interested to follow-up with [facility] cardiology and has transportation issues to go to [place]. Will schedule follow-up in [place] per patient's request.  # Apical mural thrombus diagnosed in 01/2022: Continue Eliquis 5 mg twice daily.  Not on Coumadin due to cost noncompliance issues.  Will need to repeat limited echocardiogram with contrast in a few months to evaluate for resolution of LV thrombus.  # Paroxysmal A-fib: Continue amiodarone 100 mg once daily, discontinued metoprolol due to severe reduced LV systolic function and continue Eliquis 5 mg twice daily.  # PAD s/p R thromboembolectomy (embolism likely from LV thrombus due to noncompliance with Eliquis) and R TP trunk endarterectomy in 01/2022: Continue aspirin and Eliquis.  Follows up with vascular surgery.  I have spent a total of 30 minutes with patient reviewing chart , telemetry, EKGs, labs and examining patient as well as establishing an assessment and plan that was discussed with the patient.  > 50% of time was spent in direct patient care.     Signed, Marjo Bicker, MD  04/23/2022, 11:16 AM

## 2022-04-24 DIAGNOSIS — I5043 Acute on chronic combined systolic (congestive) and diastolic (congestive) heart failure: Secondary | ICD-10-CM | POA: Diagnosis not present

## 2022-04-24 LAB — BASIC METABOLIC PANEL
Anion gap: 10 (ref 5–15)
BUN: 33 mg/dL — ABNORMAL HIGH (ref 8–23)
CO2: 31 mmol/L (ref 22–32)
Calcium: 8.5 mg/dL — ABNORMAL LOW (ref 8.9–10.3)
Chloride: 93 mmol/L — ABNORMAL LOW (ref 98–111)
Creatinine, Ser: 1.91 mg/dL — ABNORMAL HIGH (ref 0.61–1.24)
GFR, Estimated: 38 mL/min — ABNORMAL LOW (ref >60)
Glucose, Bld: 160 mg/dL — ABNORMAL HIGH (ref 70–99)
Potassium: 3.1 mmol/L — ABNORMAL LOW (ref 3.5–5.1)
Sodium: 134 mmol/L — ABNORMAL LOW (ref 135–145)

## 2022-04-24 LAB — MAGNESIUM: Magnesium: 2 mg/dL (ref 1.7–2.4)

## 2022-04-24 LAB — GLUCOSE, CAPILLARY: Glucose-Capillary: 104 mg/dL — ABNORMAL HIGH (ref 70–99)

## 2022-04-24 MED ORDER — FUROSEMIDE 40 MG PO TABS: 40.0000 mg | ORAL_TABLET | Freq: Two times a day (BID) | ORAL | 11 refills | Status: DC

## 2022-04-24 MED ORDER — SACUBITRIL-VALSARTAN 24-26 MG PO TABS: 1.0000 | ORAL_TABLET | Freq: Two times a day (BID) | ORAL | 0 refills | Status: DC

## 2022-04-24 MED ORDER — POTASSIUM CHLORIDE CRYS ER 20 MEQ PO TBCR: 20.0000 meq | EXTENDED_RELEASE_TABLET | Freq: Every day | ORAL | 0 refills | Status: DC

## 2022-04-24 MED ORDER — POTASSIUM CHLORIDE CRYS ER 20 MEQ PO TBCR
40.0000 meq | EXTENDED_RELEASE_TABLET | Freq: Once | ORAL | Status: AC
  Administered 2022-04-24: 40 meq via ORAL
  Filled 2022-04-24: qty 2

## 2022-04-24 MED ORDER — OXYCODONE HCL 5 MG PO TABS: 5.0000 mg | ORAL_TABLET | ORAL | 0 refills | Status: DC | PRN

## 2022-04-24 NOTE — Discharge Summary (Addendum)
Physician Discharge Summary  [name] [name] ZOX:[id number] DOB: [birth year] DOA: 04/19/2022  PCP: Rica Records, FNP  Admit date: 04/19/2022  Discharge date: 04/24/2022  Admitted From:Home  Disposition:  Home  Recommendations for Outpatient Follow-up:  Follow up with PCP in 1-2 weeks Follow-up with cardiology as scheduled 4/16 at 1:30 PM Continue on Lasix 40 mg twice daily as prescribed and discontinue torsemide starting 4/7 Started on Entresto as prescribed starting 4/7 Oxycodone prescription provided with 10 tablets and 0 refills Continue on potassium supplementation as prescribed and follow-up BMP in 1 week Metoprolol discontinued  Home Health: Declines home health PT  Equipment/Devices: None  Discharge Condition:Stable  CODE STATUS: Full  Diet recommendation: Heart Healthy/carb modified  Brief/Interim Summary:  [name] [name] is a 67 y.o. male with medical history significant of anxiety, CHF, systolic heart failure, diabetes mellitus, hypertension, and more presents ED with a chief complaint of dyspnea and fatigue.  He was admitted with acute HFrEF with LVEF 20%.  He is noted to have medication noncompliance and has been started on IV Lasix drip for diuresis.  He is on Eliquis for anticoagulation due to apical thrombus and cardiology is following.  Patient was diuresed over the course of several days and is now in stable condition for discharge with transition to oral Lasix as noted above as well as Entresto.  He has close follow-up appointment as noted above and no other acute events or concerns noted.  He will remain on Eliquis for apical mural thrombus as well as other home medications and adjustments as noted above.  Discharge Diagnoses:  Principal Problem:   Acute on chronic combined systolic and diastolic CHF (congestive heart failure) Active Problems:   Type 2 diabetes with nephropathy   Hyperlipidemia   Chronic pain   Acute respiratory failure with  hypoxia   LV (left ventricular) mural thrombus   Tobacco use disorder   History of DVT (deep vein thrombosis)   Hypothyroidism   PAD (peripheral artery disease)   Nonischemic cardiomyopathy  Principal discharge diagnosis: Acute hypoxemic respiratory failure secondary to acute on chronic systolic CHF exacerbation.  Discharge Instructions  Discharge Instructions     Diet - low sodium heart healthy   Complete by: As directed    Increase activity slowly   Complete by: As directed       Allergies as of 04/24/2022       Reactions   Bee Pollen Swelling        Medication List     STOP taking these medications    cephALEXin 500 MG capsule Commonly known as: KEFLEX   metoprolol succinate 50 MG 24 hr tablet Commonly known as: TOPROL-XL   torsemide 20 MG tablet Commonly known as: DEMADEX       TAKE these medications    albuterol 108 (90 Base) MCG/ACT inhaler Commonly known as: VENTOLIN HFA Inhale 2 puffs into the lungs every 6 (six) hours as needed for wheezing or shortness of breath.   amiodarone 200 MG tablet Commonly known as: PACERONE Take 0.5 tablets (100 mg total) by mouth daily.   Aspirin Low Dose 81 MG tablet Generic drug: aspirin EC Take 1 tablet (81 mg total) by mouth daily at 6 (six) AM. Swallow whole.   dapagliflozin propanediol 10 MG Tabs tablet Commonly known as: FARXIGA Take 1 tablet (10 mg total) by mouth daily.   Eliquis 5 MG Tabs tablet Generic drug: apixaban Take 1 tablet (5 mg total) by mouth 2 (two) times daily.  furosemide 40 MG tablet Commonly known as: Lasix Take 1 tablet (40 mg total) by mouth 2 (two) times daily. Start taking on: April 25, 2022   levothyroxine 137 MCG tablet Commonly known as: SYNTHROID Take 137 mcg by mouth every morning.   Magnesium 250 MG Tabs Take 250 mg by mouth daily.   oxyCODONE 5 MG immediate release tablet Commonly known as: Oxy IR/ROXICODONE Take 1 tablet (5 mg total) by mouth every 4 (four) hours  as needed for moderate pain, severe pain or breakthrough pain. What changed:  medication strength how much to take when to take this reasons to take this   pantoprazole 40 MG tablet Commonly known as: PROTONIX Take 1 tablet (40 mg total) by mouth daily.   polyethylene glycol powder 17 GM/SCOOP powder Commonly known as: GLYCOLAX/MIRALAX Take 17 g by mouth daily.   potassium chloride SA 20 MEQ tablet Commonly known as: KLOR-CON M Take 1 tablet (20 mEq total) by mouth daily.   rosuvastatin 20 MG tablet Commonly known as: CRESTOR Take 1 tablet (20 mg total) by mouth daily.   sacubitril-valsartan 24-26 MG Commonly known as: ENTRESTO Take 1 tablet by mouth 2 (two) times daily. Start taking on: April 25, 2022   Senexon-S 8.6-50 MG tablet Generic drug: senna-docusate Take 1 tablet by mouth at bedtime as needed for mild constipation.        Follow-up Information     Laurann Montana, PA-C Follow up on 05/04/2022.   Specialties: Cardiology, Radiology Why: Cardiology Hospital Follow-up on 05/04/2022 at 1:30 PM. Contact information: [address] [place] [address] [phone]         Del Nigel Berthold, FNP. Schedule an appointment as soon as possible for a visit in 2 week(s).   Specialty: Family Medicine Contact information: [address] [place] [address] [phone]                Allergies  Allergen Reactions   Bee Pollen Swelling    Consultations: Cardiology   Procedures/Studies: DG Chest 2 View  Result Date: 04/19/2022 CLINICAL DATA:  SOB EXAM: CHEST - 2 VIEW COMPARISON:  02/08/2022 FINDINGS: Enlarged cardiac silhouette. Bibasilar consolidation. Moderate pleural effusion on the right and small pleural effusion on the left. Normal pulmonary vasculature. No pneumothorax. IMPRESSION: Bibasilar consolidation. Enlarged cardiac silhouette. Bilateral pleural effusions. Electronically Signed   By: Layla Maw M.D.   On: 04/19/2022  19:57   DG Knee Complete 4 Views Right  Result Date: 04/06/2022 CLINICAL DATA:  Right knee pain, swelling EXAM: RIGHT KNEE - COMPLETE 4+ VIEW COMPARISON:  None Available. FINDINGS: Large joint effusion. No evidence of acute fracture or joint malalignment. Remote fibular fracture. Calcific atherosclerosis. IMPRESSION: 1. Large joint effusion. Consider cross-sectional imaging for further evaluation. 2. No evidence of acute fracture or joint malalignment. Electronically Signed   By: Feliberto Harts M.D.   On: 04/06/2022 10:45     Discharge Exam: Vitals:   04/24/22 0421 04/24/22 0832  BP:  98/68  Pulse:  69  Resp:    Temp: 97.9 F (36.6 C)   SpO2:     Vitals:   04/24/22 0410 04/24/22 0417 04/24/22 0421 04/24/22 0832  BP: (!) 85/59   98/68  Pulse: 62   69  Resp: 20     Temp:   97.9 F (36.6 C)   TempSrc:   Oral   SpO2: 99%     Weight:  76.3 kg    Height:  General: Pt is alert, awake, not in acute distress Cardiovascular: RRR, S1/S2 +, no rubs, no gallops Respiratory: CTA bilaterally, no wheezing, no rhonchi Abdominal: Soft, NT, ND, bowel sounds + Extremities: no edema, no cyanosis    The results of significant diagnostics from this hospitalization (including imaging, microbiology, ancillary and laboratory) are listed below for reference.     Microbiology: Recent Results (from the past 240 hour(s))  Resp panel by RT-PCR (RSV, Flu A&B, Covid) Anterior Nasal Swab     Status: None   Collection Time: 04/19/22  7:37 PM   Specimen: Anterior Nasal Swab  Result Value Ref Range Status   SARS Coronavirus 2 by RT PCR NEGATIVE NEGATIVE Final    Comment: (NOTE) SARS-CoV-2 target nucleic acids are NOT DETECTED.  The SARS-CoV-2 RNA is generally detectable in upper respiratory specimens during the acute phase of infection. The lowest concentration of SARS-CoV-2 viral copies this assay can detect is 138 copies/mL. A negative result does not preclude SARS-Cov-2 infection and  should not be used as the sole basis for treatment or other patient management decisions. A negative result may occur with  improper specimen collection/handling, submission of specimen other than nasopharyngeal swab, presence of viral mutation(s) within the areas targeted by this assay, and inadequate number of viral copies(<138 copies/mL). A negative result must be combined with clinical observations, patient history, and epidemiological information. The expected result is Negative.  Fact Sheet for Patients:  BloggerCourse.com  Fact Sheet for Healthcare Providers:  SeriousBroker.it  This test is no t yet approved or cleared by the [place] FDA and  has been authorized for detection and/or diagnosis of SARS-CoV-2 by FDA under an Emergency Use Authorization (EUA). This EUA will remain  in effect (meaning this test can be used) for the duration of the COVID-19 declaration under Section 564(b)(1) of the Act, 21 U.S.C.section 360bbb-3(b)(1), unless the authorization is terminated  or revoked sooner.       Influenza A by PCR NEGATIVE NEGATIVE Final   Influenza B by PCR NEGATIVE NEGATIVE Final    Comment: (NOTE) The Xpert Xpress SARS-CoV-2/FLU/RSV plus assay is intended as an aid in the diagnosis of influenza from Nasopharyngeal swab specimens and should not be used as a sole basis for treatment. Nasal washings and aspirates are unacceptable for Xpert Xpress SARS-CoV-2/FLU/RSV testing.  Fact Sheet for Patients: BloggerCourse.com  Fact Sheet for Healthcare Providers: SeriousBroker.it  This test is not yet approved or cleared by the [place] FDA and has been authorized for detection and/or diagnosis of SARS-CoV-2 by FDA under an Emergency Use Authorization (EUA). This EUA will remain in effect (meaning this test can be used) for the duration of the COVID-19 declaration  under Section 564(b)(1) of the Act, 21 U.S.C. section 360bbb-3(b)(1), unless the authorization is terminated or revoked.     Resp Syncytial Virus by PCR NEGATIVE NEGATIVE Final    Comment: (NOTE) Fact Sheet for Patients: BloggerCourse.com  Fact Sheet for Healthcare Providers: SeriousBroker.it  This test is not yet approved or cleared by the [place] FDA and has been authorized for detection and/or diagnosis of SARS-CoV-2 by FDA under an Emergency Use Authorization (EUA). This EUA will remain in effect (meaning this test can be used) for the duration of the COVID-19 declaration under Section 564(b)(1) of the Act, 21 U.S.C. section 360bbb-3(b)(1), unless the authorization is terminated or revoked.  Performed at [facility], [address] [place], [place] [address]   MRSA Next Gen by PCR, Nasal  Status: None   Collection Time: 04/21/22  6:00 AM   Specimen: Nasal Mucosa; Nasal Swab  Result Value Ref Range Status   MRSA by PCR Next Gen NOT DETECTED NOT DETECTED Final    Comment: (NOTE) The GeneXpert MRSA Assay (FDA approved for NASAL specimens only), is one component of a comprehensive MRSA colonization surveillance program. It is not intended to diagnose MRSA infection nor to guide or monitor treatment for MRSA infections. Test performance is not FDA approved in patients less than 66 years old. Performed at [facility], [address] [place], [place] [id number]      Labs: BNP (last 3 results) Recent Labs    04/21/22 0446 04/22/22 0401 04/23/22 0638  BNP 3,276.0* 3,187.0* 1,260.0*   Basic Metabolic Panel: Recent Labs  Lab 04/20/22 0354 04/20/22 1826 04/21/22 0446 04/21/22 1431 04/22/22 0401 04/23/22 0638 04/24/22 0510  NA 139   < > 140 138 138 139 134*  K 4.3   < > 3.4* 3.3* 3.1* 3.8 3.1*  CL 105   < > 99 94* 94* 90* 93*  CO2 23   < > 30 32 31 35* 31  GLUCOSE 124*   < > 114* 107* 131* 115* 160*  BUN  25*   < > 27* 27* 26* 30* 33*  CREATININE 1.76*   < > 1.83* 1.83* 1.71* 1.79* 1.91*  CALCIUM 8.2*   < > 8.2* 8.3* 8.2* 9.0 8.5*  MG 2.2  --   --  1.9 1.8 1.8 2.0   < > = values in this interval not displayed.   Liver Function Tests: Recent Labs  Lab 04/19/22 1931 04/20/22 0354  AST 20 16  ALT 10 11  ALKPHOS 89 91  BILITOT 1.0 0.6  PROT 7.0 7.3  ALBUMIN 3.1* 3.1*   No results for input(s): "LIPASE", "AMYLASE" in the last 168 hours. No results for input(s): "AMMONIA" in the last 168 hours. CBC: Recent Labs  Lab 04/19/22 1931 04/20/22 0354 04/21/22 0446 04/22/22 0401 04/23/22 0638  WBC 6.1 6.4 5.1 6.0 6.0  NEUTROABS 3.9 3.9  --   --   --   HGB 11.9* 12.0* 11.3* 11.9* 14.3  HCT 38.1* 40.0 35.7* 37.2* 45.4  MCV 101.3* 103.6* 99.4 97.1 98.3  PLT 265 249 255 291 295   Cardiac Enzymes: No results for input(s): "CKTOTAL", "CKMB", "CKMBINDEX", "TROPONINI" in the last 168 hours. BNP: Invalid input(s): "POCBNP" CBG: Recent Labs  Lab 04/23/22 0742 04/23/22 1139 04/23/22 1707 04/23/22 2108 04/24/22 0813  GLUCAP 195* 170* 120* 169* 104*   D-Dimer No results for input(s): "DDIMER" in the last 72 hours. Hgb A1c No results for input(s): "HGBA1C" in the last 72 hours. Lipid Profile No results for input(s): "CHOL", "HDL", "LDLCALC", "TRIG", "CHOLHDL", "LDLDIRECT" in the last 72 hours. Thyroid function studies No results for input(s): "TSH", "T4TOTAL", "T3FREE", "THYROIDAB" in the last 72 hours.  Invalid input(s): "FREET3" Anemia work up No results for input(s): "VITAMINB12", "FOLATE", "FERRITIN", "TIBC", "IRON", "RETICCTPCT" in the last 72 hours. Urinalysis    Component Value Date/Time   COLORURINE YELLOW 06/28/2006 1102   APPEARANCEUR CLEAR 06/28/2006 1102   LABSPEC 1.020 06/28/2006 1102   PHURINE 6.0 06/28/2006 1102   GLUCOSEU NEGATIVE 06/28/2006 1102   HGBUR NEGATIVE 06/28/2006 1102   BILIRUBINUR NEGATIVE 06/28/2006 1102   KETONESUR NEGATIVE 06/28/2006 1102    PROTEINUR NEGATIVE 06/28/2006 1102   UROBILINOGEN 1.0 06/28/2006 1102   NITRITE NEGATIVE 06/28/2006 1102   LEUKOCYTESUR  06/28/2006 1102  NEGATIVE MICROSCOPIC NOT DONE ON URINES WITH NEGATIVE PROTEIN, BLOOD, LEUKOCYTES, NITRITE, OR GLUCOSE <1000 mg/dL.   Sepsis Labs Recent Labs  Lab 04/20/22 0354 04/21/22 0446 04/22/22 0401 04/23/22 0638  WBC 6.4 5.1 6.0 6.0   Microbiology Recent Results (from the past 240 hour(s))  Resp panel by RT-PCR (RSV, Flu A&B, Covid) Anterior Nasal Swab     Status: None   Collection Time: 04/19/22  7:37 PM   Specimen: Anterior Nasal Swab  Result Value Ref Range Status   SARS Coronavirus 2 by RT PCR NEGATIVE NEGATIVE Final    Comment: (NOTE) SARS-CoV-2 target nucleic acids are NOT DETECTED.  The SARS-CoV-2 RNA is generally detectable in upper respiratory specimens during the acute phase of infection. The lowest concentration of SARS-CoV-2 viral copies this assay can detect is 138 copies/mL. A negative result does not preclude SARS-Cov-2 infection and should not be used as the sole basis for treatment or other patient management decisions. A negative result may occur with  improper specimen collection/handling, submission of specimen other than nasopharyngeal swab, presence of viral mutation(s) within the areas targeted by this assay, and inadequate number of viral copies(<138 copies/mL). A negative result must be combined with clinical observations, patient history, and epidemiological information. The expected result is Negative.  Fact Sheet for Patients:  BloggerCourse.com  Fact Sheet for Healthcare Providers:  SeriousBroker.it  This test is no t yet approved or cleared by the [place] FDA and  has been authorized for detection and/or diagnosis of SARS-CoV-2 by FDA under an Emergency Use Authorization (EUA). This EUA will remain  in effect (meaning this test can be used) for the duration  of the COVID-19 declaration under Section 564(b)(1) of the Act, 21 U.S.C.section 360bbb-3(b)(1), unless the authorization is terminated  or revoked sooner.       Influenza A by PCR NEGATIVE NEGATIVE Final   Influenza B by PCR NEGATIVE NEGATIVE Final    Comment: (NOTE) The Xpert Xpress SARS-CoV-2/FLU/RSV plus assay is intended as an aid in the diagnosis of influenza from Nasopharyngeal swab specimens and should not be used as a sole basis for treatment. Nasal washings and aspirates are unacceptable for Xpert Xpress SARS-CoV-2/FLU/RSV testing.  Fact Sheet for Patients: BloggerCourse.com  Fact Sheet for Healthcare Providers: SeriousBroker.it  This test is not yet approved or cleared by the [place] FDA and has been authorized for detection and/or diagnosis of SARS-CoV-2 by FDA under an Emergency Use Authorization (EUA). This EUA will remain in effect (meaning this test can be used) for the duration of the COVID-19 declaration under Section 564(b)(1) of the Act, 21 U.S.C. section 360bbb-3(b)(1), unless the authorization is terminated or revoked.     Resp Syncytial Virus by PCR NEGATIVE NEGATIVE Final    Comment: (NOTE) Fact Sheet for Patients: BloggerCourse.com  Fact Sheet for Healthcare Providers: SeriousBroker.it  This test is not yet approved or cleared by the [place] FDA and has been authorized for detection and/or diagnosis of SARS-CoV-2 by FDA under an Emergency Use Authorization (EUA). This EUA will remain in effect (meaning this test can be used) for the duration of the COVID-19 declaration under Section 564(b)(1) of the Act, 21 U.S.C. section 360bbb-3(b)(1), unless the authorization is terminated or revoked.  Performed at [facility], [address], [place], [place] [address]   MRSA Next Gen by PCR, Nasal     Status: None   Collection Time: 04/21/22   6:00 AM   Specimen: Nasal Mucosa; Nasal Swab  Result Value Ref Range  Status   MRSA by PCR Next Gen NOT DETECTED NOT DETECTED Final    Comment: (NOTE) The GeneXpert MRSA Assay (FDA approved for NASAL specimens only), is one component of a comprehensive MRSA colonization surveillance program. It is not intended to diagnose MRSA infection nor to guide or monitor treatment for MRSA infections. Test performance is not FDA approved in patients less than 39 years old. Performed at [facility], [address], [place], [place] [address]      Time coordinating discharge: 35 minutes  SIGNED:   Erick Blinks, DO [facility] 04/24/2022, 9:27 AM  If 7PM-7AM, please contact night-coverage [email or web address]

## 2022-04-26 ENCOUNTER — Telehealth: Payer: Self-pay

## 2022-04-26 NOTE — Transitions of Care (Post Inpatient/ED Visit) (Signed)
   04/26/2022  Name: [name] [name] MRN: [id number] DOB: Sep 11, 1955  Today's TOC FU Call Status: Today's TOC FU Call Status:: Successful TOC FU Call Competed TOC FU Call Complete Date: 04/26/22  Transition Care Management Follow-up Telephone Call [facility] Type of Discharge: Inpatient Admission Primary Inpatient Discharge Diagnosis:: heart failure How have you been since you were released from the hospital?: Better Any questions or concerns?: No  Items Reviewed: Did you receive and understand the discharge instructions provided?: Yes Medications obtained and verified?: Yes (Medications Reviewed) Any new allergies since your discharge?: No Dietary orders reviewed?: Yes Do you have support at home?: Yes People in Home: spouse  Home Care and Equipment/Supplies: Were Home Health Services Ordered?: NA Any new equipment or medical supplies ordered?: NA  Functional Questionnaire: Do you need assistance with bathing/showering or dressing?: No Do you need assistance with meal preparation?: No Do you need assistance with eating?: No Do you have difficulty maintaining continence: No Do you need assistance with getting out of bed/getting out of a chair/moving?: No Do you have difficulty managing or taking your medications?: No  Follow up appointments reviewed: PCP Follow-up appointment confirmed?: Yes Date of PCP follow-up appointment?: 04/30/22 Follow-up Provider: [facility] Follow-up appointment confirmed?: NA Do you need transportation to your follow-up appointment?: No Do you understand care options if your condition(s) worsen?: Yes-patient verbalized understanding    SIGNATURE Karena Addison, LPN [facility] Nurse Health Advisor Direct Dial [phone]

## 2022-04-28 ENCOUNTER — Ambulatory Visit: Payer: Medicare HMO

## 2022-04-28 ENCOUNTER — Ambulatory Visit (HOSPITAL_COMMUNITY): Payer: Medicare HMO

## 2022-04-29 ENCOUNTER — Encounter: Payer: Self-pay | Admitting: Gastroenterology

## 2022-04-29 ENCOUNTER — Ambulatory Visit: Payer: Medicare HMO | Admitting: Gastroenterology

## 2022-04-29 NOTE — Progress Notes (Deleted)
GI Office Note    Referring Provider: Wylene Men* Primary Care Physician:  Rica Records, FNP  Primary Gastroenterologist: ***  Chief Complaint   No chief complaint on file.  History of Present Illness   [name] [name] is a 67 y.o. male presenting today at the request of Del York Pellant* for colonoscopy and elevated alkaline phosphatase.  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 Incomplete collateral reconstitution of tibial runoff -Moderate bilateral pleural effusions, new from prior -Descending and sigmoid diverticulosis -No evidence of cholelithiasis or cholecystitis, no biliary ductal dilation. -Stable hepatic cyst.   Last echocardiogram in January 2024 with large mobile apical thrombus and LVEF measuring less than 20%.  PLAX estimated at 12%.  Also with mildly reduced right ventricular systolic function.  Recently discharged 04/24/22 from [facility] for admission for dyspnea and fatigue and had evidence of acute heart failure exacerbation. Known EF of 20%. Known medication noncompliance with diuretics. On eliquis due to apical thrombus. Was transitioned to oral lasix and entresto on discharge.   Today:    Current Outpatient Medications  Medication Sig Dispense Refill   albuterol (VENTOLIN HFA) 108 (90 Base) MCG/ACT inhaler Inhale 2 puffs into the lungs every 6 (six) hours as needed for wheezing or shortness of breath. 8 g 2   amiodarone (PACERONE) 200 MG tablet Take 0.5 tablets (100 mg total) by mouth daily. 45 tablet 3   apixaban (ELIQUIS) 5 MG TABS tablet Take 1 tablet (5 mg total) by mouth 2 (two) times daily. 60 tablet 0   aspirin EC 81 MG tablet Take 1 tablet (81 mg total) by mouth daily at  6 (six) AM. Swallow whole. 30 tablet 12   dapagliflozin propanediol (FARXIGA) 10 MG TABS tablet Take 1 tablet (10 mg total) by mouth daily. 30 tablet 11   furosemide (LASIX) 40 MG tablet Take 1 tablet (40 mg total) by mouth 2 (two) times daily. 30 tablet 11   levothyroxine (SYNTHROID) 137 MCG tablet Take 137 mcg by mouth every morning.     Magnesium 250 MG TABS Take 250 mg by mouth daily.     oxyCODONE (OXY IR/ROXICODONE) 5 MG immediate release tablet Take 1 tablet (5 mg total) by mouth every 4 (four) hours as needed for moderate pain, severe pain or breakthrough pain. 10 tablet 0   pantoprazole (PROTONIX) 40 MG tablet Take 1 tablet (40 mg total) by mouth daily. 30 tablet 0   polyethylene glycol powder (GLYCOLAX/MIRALAX) 17 GM/SCOOP powder Take 17 g by mouth daily. (Patient not taking: Reported on 04/19/2022) 500 g 0   potassium chloride SA (KLOR-CON M) 20 MEQ tablet Take 1 tablet (20 mEq total) by mouth daily. 30 tablet 0   rosuvastatin (CRESTOR) 20 MG tablet Take 1 tablet (20 mg total) by mouth daily. (Patient not taking: Reported on 04/19/2022) 30 tablet 0   sacubitril-valsartan (ENTRESTO) 24-26 MG Take 1 tablet by mouth 2 (two) times daily. 60 tablet 0   senna-docusate (SENOKOT-S) 8.6-50 MG tablet Take 1 tablet by mouth at bedtime as needed for mild constipation. (Patient not taking: Reported on 04/19/2022) 30 tablet 0   No current facility-administered medications for this visit.    Past Medical History:  Diagnosis Date   Anxiety    CHF (  congestive heart failure)    a. EF 15% in 2013 with cath showing normal cors b. EF 30-35% by repeat echo in 2015   Chronic systolic heart failure    Diabetes mellitus    Fluid retention in legs    Hypertension     Past Surgical History:  Procedure Laterality Date   COLONOSCOPY  01/04/2012   Procedure: COLONOSCOPY;  Surgeon: West Bali, MD;  Location: [facility];  Service: Endoscopy;  Laterality: N/A;  1:30 PM   ENDARTERECTOMY POPLITEAL Right  02/08/2022   Procedure: TIBIAL PERONEAL ENDARTERECTOMY;  Surgeon: Maeola Harman, MD;  Location: [facility] OR;  Service: Vascular;  Laterality: Right;   HERNIA REPAIR     LEFT AND RIGHT HEART CATHETERIZATION WITH CORONARY ANGIOGRAM N/A 07/09/2011   Procedure: LEFT AND RIGHT HEART CATHETERIZATION WITH CORONARY ANGIOGRAM;  Surgeon: Peter M Swaziland, MD;  Location: [facility] CATH LAB;  Service: Cardiovascular;  Laterality: N/A;   RIGHT/LEFT HEART CATH AND CORONARY ANGIOGRAPHY N/A 02/25/2020   Procedure: RIGHT/LEFT HEART CATH AND CORONARY ANGIOGRAPHY;  Surgeon: Laurey Morale, MD;  Location: [facility] INVASIVE CV LAB;  Service: Cardiovascular;  Laterality: N/A;   THROMBECTOMY FEMORAL ARTERY Right 02/08/2022   Procedure: RIGHT LOWER THROMBECTOMY WITH VEIN PATCH;  Surgeon: Maeola Harman, MD;  Location: [facility] OR;  Service: Vascular;  Laterality: Right;   VEIN HARVEST Right 02/08/2022   Procedure: RIGHT LOWER SAPHENOUS VEIN HARVEST;  Surgeon: Maeola Harman, MD;  Location: [facility] OR;  Service: Vascular;  Laterality: Right;    Family History  Problem Relation Age of Onset   Heart attack Father    Heart failure Father    Cancer Mother        Multiple myeloma   Heart disease Sister     Allergies as of 04/29/2022 - Review Complete 04/26/2022  Allergen Reaction Noted   Bee pollen Swelling 02/08/2022    Social History   Socioeconomic History   Marital status: Married    Spouse name: Not on file   Number of children: 0   Years of education: Not on file   Highest education level: Not on file  Occupational History   Occupation: disabled  Tobacco Use   Smoking status: Some Days    Packs/day: .25    Types: Cigarettes, E-cigarettes    Start date: 10/13/1982    Last attempt to quit: 09/19/2011    Years since quitting: 10.6    Passive exposure: Never   Smokeless tobacco: Never   Tobacco comments:    electronic cig for 4 months   Vaping Use   Vaping Use: Never used  Substance and Sexual  Activity   Alcohol use: Not Currently    Comment: weekends   Drug use: No   Sexual activity: Not on file  Other Topics Concern   Not on file  Social History Narrative   Patient is right handed.   Patient seldom drinks caffeine.   Social Determinants of Health   Financial Resource Strain: Medium Risk (04/07/2022)   Overall Financial Resource Strain (CARDIA)    Difficulty of Paying Living Expenses: Somewhat hard  Food Insecurity: No Food Insecurity (04/20/2022)   Hunger Vital Sign    Worried About Running Out of Food in the Last Year: Never true    Ran Out of Food in the Last Year: Never true  Transportation Needs: No Transportation Needs (04/20/2022)   PRAPARE - Administrator, Civil Service (Medical): No    Lack of Transportation (Non-Medical):  No  Physical Activity: Inactive (04/07/2022)   Exercise Vital Sign    Days of Exercise per Week: 0 days    Minutes of Exercise per Session: 0 min  Stress: No Stress Concern Present (04/07/2022)   [employer] - Occupational Stress Questionnaire    Feeling of Stress : Not at all  Social Connections: Moderately Isolated (04/07/2022)   Social Connection and Isolation Panel [NHANES]    Frequency of Communication with Friends and Family: Twice a week    Frequency of Social Gatherings with Friends and Family: Never    Attends Religious Services: 1 to 4 times per year    Active Member of [employer]ubs or Organizations: No    Attends BankerClub or Organization Meetings: Never    Marital Status: Married  Catering managerntimate Partner Violence: Not At Risk (04/20/2022)   Humiliation, Afraid, Rape, and Kick questionnaire    Fear of Current or Ex-Partner: No    Emotionally Abused: No    Physically Abused: No    Sexually Abused: No     Review of Systems   Gen: Denies any fever, chills, fatigue, weight loss, lack of appetite.  CV: Denies chest pain, heart palpitations, peripheral edema, syncope.  Resp: Denies shortness of breath at rest  or with exertion. Denies wheezing or cough.  GI: see HPI GU : Denies urinary burning, urinary frequency, urinary hesitancy MS: Denies joint pain, muscle weakness, cramps, or limitation of movement.  Derm: Denies rash, itching, dry skin Psych: Denies depression, anxiety, memory loss, and confusion Heme: Denies bruising, bleeding, and enlarged lymph nodes.   Physical Exam   There were no vitals taken for this visit.  General:   Alert and oriented. Pleasant and cooperative. Well-nourished and well-developed.  Head:  Normocephalic and atraumatic. Eyes:  Without icterus, sclera clear and conjunctiva pink.  Ears:  Normal auditory acuity. Mouth:  No deformity or lesions, oral mucosa pink.  Lungs:  Clear to auscultation bilaterally. No wheezes, rales, or rhonchi. No distress.  Heart:  S1, S2 present without murmurs appreciated.  Abdomen:  +BS, soft, non-tender and non-distended. No HSM noted. No guarding or rebound. No masses appreciated.  Rectal:  Deferred  Msk:  Symmetrical without gross deformities. Normal posture. Extremities:  Without edema. Neurologic:  Alert and  oriented x4;  grossly normal neurologically. Skin:  Intact without significant lesions or rashes. Psych:  Alert and cooperative. Normal mood and affect.   Assessment   [name] [name] is a 67 y.o. male with a history of chronic systolic heart failure with EF of less than 20%, DVT, apical thrombus on Eliquis, NICM, type 2 diabetes, chronic pain, PAD, and hypothyroidism presenting today for evaluation of elevated alkaline phosphatase and need for screening colonoscopy.  Screening for colon cancer:  Elevated alk phos: Elevated to 227 in February of this year.  Most recent labs while inpatient with normal LFTs including normalized alk phos and T. bili.  PLAN   *** Given his severely reduced ejection fraction [facility], he will need [facility].  Will need to  refer to [place].    Brooke Bonitoourtney Magdalynn Davilla, MSN, FNP-BC, AGACNP-BC [facility]

## 2022-04-30 ENCOUNTER — Encounter: Payer: Self-pay | Admitting: Family Medicine

## 2022-04-30 ENCOUNTER — Ambulatory Visit (INDEPENDENT_AMBULATORY_CARE_PROVIDER_SITE_OTHER): Payer: Medicare HMO | Admitting: Family Medicine

## 2022-04-30 VITALS — BP 100/71 | HR 72 | Ht 72.0 in | Wt 179.0 lb

## 2022-04-30 DIAGNOSIS — R3981 Functional urinary incontinence: Secondary | ICD-10-CM | POA: Diagnosis not present

## 2022-04-30 DIAGNOSIS — Z8679 Personal history of other diseases of the circulatory system: Secondary | ICD-10-CM

## 2022-04-30 DIAGNOSIS — I5043 Acute on chronic combined systolic (congestive) and diastolic (congestive) heart failure: Secondary | ICD-10-CM

## 2022-04-30 MED ORDER — TAMSULOSIN HCL 0.4 MG PO CAPS
0.4000 mg | ORAL_CAPSULE | Freq: Every day | ORAL | 3 refills | Status: DC
Start: 2022-04-30 — End: 2022-08-08

## 2022-04-30 NOTE — Patient Instructions (Signed)
It was pleasure meeting with you today. Please take medications as prescribed. Follow up with your primary health provider if any health concerns arises. If symptoms worsen please contact your primary care provider and/or visit the emergency department.  

## 2022-04-30 NOTE — Assessment & Plan Note (Addendum)
Vitals:   04/30/22 1534 04/30/22 1600  BP: 97/62 100/71    Hospital Discharge follow up. Patient reported feeling much better no chest pain or Shortness or breath. Patient agreed on taking Lasix 40 mg twice daily and Entresto 24-25 mg 2 times daily On potassium tablet daily, BMP lab ordered - awaiting results , will follow up  Patient follow up will cardiology on 05/04/22

## 2022-04-30 NOTE — Assessment & Plan Note (Signed)
Patient reported frequent and urgent need to pee, at times having trouble starting to pee, and weak urine stream with dribbling at the end of urination   PSA lab ordered- awaiting results will follow up. Prescribed Flomax 0.4 mg  Discussed If symptoms persist after medication follow up

## 2022-04-30 NOTE — Progress Notes (Signed)
Patient Office Visit   Subjective   Patient ID: [name] [name], male    DOB: 30-Nov-1955  Age: 67 y.o. MRN: [id number]  CC:  Chief Complaint  Patient presents with   Follow-up    Patient is here for hospital f/u after being in [facility] from 4/1-4/6 for heart failure. Started on entresto. D/c'd metoprolol and torsemide.     HPI [name] [name] 67 year old male presents to clinic hospital follow for heart failure.He  has a past medical history of Anxiety, CHF (congestive heart failure), Chronic systolic heart failure, Diabetes mellitus, Fluid retention in legs, and Hypertension.For the details of today's visit, please refer to assessment and plan.   HPI   Outpatient Encounter Medications as of 04/30/2022  Medication Sig   albuterol (VENTOLIN HFA) 108 (90 Base) MCG/ACT inhaler Inhale 2 puffs into the lungs every 6 (six) hours as needed for wheezing or shortness of breath.   amiodarone (PACERONE) 200 MG tablet Take 0.5 tablets (100 mg total) by mouth daily.   apixaban (ELIQUIS) 5 MG TABS tablet Take 1 tablet (5 mg total) by mouth 2 (two) times daily.   aspirin EC 81 MG tablet Take 1 tablet (81 mg total) by mouth daily at 6 (six) AM. Swallow whole.   dapagliflozin propanediol (FARXIGA) 10 MG TABS tablet Take 1 tablet (10 mg total) by mouth daily.   furosemide (LASIX) 40 MG tablet Take 1 tablet (40 mg total) by mouth 2 (two) times daily.   levothyroxine (SYNTHROID) 137 MCG tablet Take 137 mcg by mouth every morning.   Magnesium 250 MG TABS Take 250 mg by mouth daily.   oxyCODONE (OXY IR/ROXICODONE) 5 MG immediate release tablet Take 1 tablet (5 mg total) by mouth every 4 (four) hours as needed for moderate pain, severe pain or breakthrough pain.   pantoprazole (PROTONIX) 40 MG tablet Take 1 tablet (40 mg total) by mouth daily.   polyethylene glycol powder (GLYCOLAX/MIRALAX) 17 GM/SCOOP powder Take 17 g by mouth daily.   potassium chloride SA (KLOR-CON M) 20 MEQ tablet Take 1 tablet (20 mEq  total) by mouth daily.   rosuvastatin (CRESTOR) 20 MG tablet Take 1 tablet (20 mg total) by mouth daily.   sacubitril-valsartan (ENTRESTO) 24-26 MG Take 1 tablet by mouth 2 (two) times daily.   senna-docusate (SENOKOT-S) 8.6-50 MG tablet Take 1 tablet by mouth at bedtime as needed for mild constipation.   tamsulosin (FLOMAX) 0.4 MG CAPS capsule Take 1 capsule (0.4 mg total) by mouth daily.   No facility-administered encounter medications on file as of 04/30/2022.    Past Surgical History:  Procedure Laterality Date   COLONOSCOPY  01/04/2012   Procedure: COLONOSCOPY;  Surgeon: West Bali, MD;  Location: [facility];  Service: Endoscopy;  Laterality: N/A;  1:30 PM   ENDARTERECTOMY POPLITEAL Right 02/08/2022   Procedure: TIBIAL PERONEAL ENDARTERECTOMY;  Surgeon: Maeola Harman, MD;  Location: [facility] OR;  Service: Vascular;  Laterality: Right;   HERNIA REPAIR     LEFT AND RIGHT HEART CATHETERIZATION WITH CORONARY ANGIOGRAM N/A 07/09/2011   Procedure: LEFT AND RIGHT HEART CATHETERIZATION WITH CORONARY ANGIOGRAM;  Surgeon: Peter M Swaziland, MD;  Location: [facility] CATH LAB;  Service: Cardiovascular;  Laterality: N/A;   RIGHT/LEFT HEART CATH AND CORONARY ANGIOGRAPHY N/A 02/25/2020   Procedure: RIGHT/LEFT HEART CATH AND CORONARY ANGIOGRAPHY;  Surgeon: Laurey Morale, MD;  Location: [facility] INVASIVE CV LAB;  Service: Cardiovascular;  Laterality: N/A;   THROMBECTOMY FEMORAL ARTERY Right 02/08/2022  Procedure: RIGHT LOWER THROMBECTOMY WITH VEIN PATCH;  Surgeon: Maeola Harman, MD;  Location: [facility] OR;  Service: Vascular;  Laterality: Right;   VEIN HARVEST Right 02/08/2022   Procedure: RIGHT LOWER SAPHENOUS VEIN HARVEST;  Surgeon: Maeola Harman, MD;  Location: [facility] OR;  Service: Vascular;  Laterality: Right;    Review of Systems  Constitutional:  Negative for fever.  Eyes:  Negative for blurred vision.  Respiratory:  Negative for shortness of breath.   Cardiovascular:  Negative for  chest pain.  Gastrointestinal:  Negative for abdominal pain, heartburn and nausea.  Genitourinary:  Positive for frequency and urgency. Negative for dysuria, flank pain and hematuria.  Neurological:  Negative for dizziness and headaches.      Objective    BP 100/71   Pulse 72   Ht 6' (1.829 m)   Wt 179 lb (81.2 kg)   SpO2 96%   BMI 24.28 kg/m   Physical Exam Vitals reviewed.  Constitutional:      General: He is not in acute distress.    Appearance: Normal appearance. He is not ill-appearing, toxic-appearing or diaphoretic.  HENT:     Head: Normocephalic.  Eyes:     General:        Right eye: No discharge.        Left eye: No discharge.     Conjunctiva/sclera: Conjunctivae normal.  Cardiovascular:     Rate and Rhythm: Normal rate.     Pulses: Normal pulses.     Heart sounds: Normal heart sounds.  Pulmonary:     Effort: Pulmonary effort is normal. No respiratory distress.     Breath sounds: Normal breath sounds.  Musculoskeletal:        General: Normal range of motion.     Cervical back: Normal range of motion.  Skin:    General: Skin is warm and dry.     Capillary Refill: Capillary refill takes less than 2 seconds.  Neurological:     General: No focal deficit present.     Mental Status: He is alert and oriented to person, place, and time.     Coordination: Coordination normal.     Gait: Gait normal.  Psychiatric:        Mood and Affect: Mood normal.        Behavior: Behavior normal.        Thought Content: Thought content normal.        Judgment: Judgment normal.       Assessment & Plan:  Functional urinary incontinence -     PSA -     Tamsulosin HCl; Take 1 capsule (0.4 mg total) by mouth daily.  Dispense: 30 capsule; Refill: 3  History of chronic CHF -     Basic metabolic panel  Acute on chronic combined systolic and diastolic CHF (congestive heart failure) Assessment & Plan: Vitals:   04/30/22 1534 04/30/22 1600  BP: 97/62 100/71    Hospital  Discharge follow up. Patient reported feeling much better no chest pain or Shortness or breath. Patient agreed on taking Lasix 40 mg twice daily and Entresto 24-25 mg 2 times daily On potassium tablet daily, BMP lab ordered - awaiting results , will follow up  Patient follow up will cardiology on 05/04/22     Urinary incontinence, functional Assessment & Plan: Patient reported frequent and urgent need to pee, at times having trouble starting to pee, and weak urine stream with dribbling at the end of urination   PSA lab ordered-  awaiting results will follow up. Prescribed Flomax 0.4 mg  Discussed If symptoms persist after medication follow up     No follow-ups on file.   Cruzita Lederer Newman Nip, FNP

## 2022-05-01 LAB — BASIC METABOLIC PANEL
BUN/Creatinine Ratio: 9 — ABNORMAL LOW (ref 10–24)
BUN: 29 mg/dL — ABNORMAL HIGH (ref 8–27)
CO2: 22 mmol/L (ref 20–29)
Calcium: 9.2 mg/dL (ref 8.6–10.2)
Chloride: 99 mmol/L (ref 96–106)
Creatinine, Ser: 3.11 mg/dL — ABNORMAL HIGH (ref 0.76–1.27)
Glucose: 122 mg/dL — ABNORMAL HIGH (ref 70–99)
Potassium: 5 mmol/L (ref 3.5–5.2)
Sodium: 137 mmol/L (ref 134–144)
eGFR: 21 mL/min/{1.73_m2} — ABNORMAL LOW (ref >59)

## 2022-05-01 LAB — PSA: Prostate Specific Ag, Serum: 2.1 ng/mL (ref 0.0–4.0)

## 2022-05-03 ENCOUNTER — Encounter: Payer: Self-pay | Admitting: Physician Assistant

## 2022-05-03 NOTE — Progress Notes (Signed)
Hello [name] [name] can you please follow up if Nephrology referral is in place? Stage 4 chronic kidney disease

## 2022-05-03 NOTE — Progress Notes (Unsigned)
Cardiology Office Note    Date:  05/04/2022   ID:  WULFRIC FLASHER, DOB [birth year], MRN [id number]  PCP:  Rica Records, FNP  Cardiologist:  Dina Rich, MD  Electrophysiologist:  None   Chief Complaint: f/u CHF  History of Present Illness:   [name] [name] is a 67 y.o. male with history of chronic HFrEF/NICM (dx with EF 15% in 2013, no CAD by cath 2013, minor RCA irregularities in 02/2020, last EF 12% in 01/2022), apical thrombus (dx by echocardiogram 04/2019), PAD (s/p RLE thromboembolectomy and right tibioperoneal trunk endarterectomy in 01/2022), poor compliance, paroxysmal atrial fibrillation (dx 04/2019), atypical RBBB, HTN, HLD, Type 2 DM, Stage 3b CKD, mild MR, mild dilation of ascending aorta, prior cocaine use (UDS 2008) who presents for post-hospital f/u.  Complex PMH is outlined above. His medical care has been challenging due to intermittent lapses in compliance and follow-up. In 2024, the patient was hospitalized 01/2022 for right lower extremity ischemia due to occlusion of the right SFA and popliteal artery with ultimate right lower extremity thrombectomy and right tibial peroneal trunk endarterectomy by vascular. Cardiology followed due to echo with EF less than 20% with large mobile apical thrombus, also with mild MR and mild dilation of ascending aorta.  He did report noncompliance with his medications prior to admission and had not been taking Eliquis or Entresto. Entresto and spirononlactone were held given hypotension and variable renal function. At discharge he was continued on amiodarone 100 mg daily, ASA 81 mg daily, Eliquis 5 mg twice daily, Farxiga 10 mg daily, Toprol-XL 50 mg daily, Crestor 20 mg daily and Torsemide 20 mg PRN. DOAC was chosen over warfarin due to compliance concern. He no-showed for f/u with AHF in 02/2022.Marland Kitchen He was subsequently readmitted 4/1-04/24/22 with hypoxemic respiratory failure secondary to acute on chronic combined CHF and seen by  our team. He required Lasix drip. Metoprolol was discontinued due to severe LV dysfunction. He was started on Entresto at discharge. QTC appeared slightly prolonged at by the last EKG but in setting of NSIVCD. He followed up with primary care 04/30/22 who repeated BMET with Cr 3.11, up from 1.91 at discharge, previously 2.1-2.4 range earlier this year.Marland Kitchen Result notes indicated plan to check on referral to nephrology. No medicine changes made at that time.  He is seen for follow-up today overall doing well. DC weight from the hospital was 168lb in gown, up to 178 at PCP earlier this month, leveled off and remaining stable around 174lb. No overt SOB, edema, chest pain. Just feels like his energy is generally down. Reports compliance with medicine most of the time, occasional lapses, some financial concerns about continuing long term. Discussed importance of taking meds. Denies illicit drug use, rare social ETOH (citing [employer] tournament time).   Labwork independently reviewed: 04/30/22 K 5.0, Cr 3.11 (up from 1.91) Earlier in 04/2022 Mg 2.0, Hgb 14.3, plt 295, BNP 1260, albumin 3.1, AST/ALT OK 02/2022 TSH ok, FT4 2.09 up, A1c 6.5, LDL 41, trig 104  Past History   Past Medical History:  Diagnosis Date   Anxiety    Apical mural thrombus    Ascending aorta dilatation    Chronic HFrEF (heart failure with reduced ejection fraction)    a. EF 15% in 2013 with cath showing normal cors b. EF 30-35% by repeat echo in 2015   CKD stage 3b, GFR 30-44 ml/min    Cocaine use    Diabetes mellitus    History of  noncompliance with medical treatment, presenting hazards to health    Hyperlipidemia    Hypertension    Mitral regurgitation    NICM (nonischemic cardiomyopathy)    PAD (peripheral artery disease)    PAF (paroxysmal atrial fibrillation)     Past Surgical History:  Procedure Laterality Date   COLONOSCOPY  01/04/2012   Procedure: COLONOSCOPY;  Surgeon: West Bali, MD;  [facility];   Service: Endoscopy;  Laterality: N/A;  1:30 PM   ENDARTERECTOMY POPLITEAL Right 02/08/2022   Procedure: TIBIAL PERONEAL ENDARTERECTOMY;  Surgeon: Maeola Harman, MD;  Location: [facility] OR;  Service: Vascular;  Laterality: Right;   HERNIA REPAIR     LEFT AND RIGHT HEART CATHETERIZATION WITH CORONARY ANGIOGRAM N/A 07/09/2011   Procedure: LEFT AND RIGHT HEART CATHETERIZATION WITH CORONARY ANGIOGRAM;  Surgeon: Peter M Swaziland, MD;  Location: [facility] CATH LAB;  Service: Cardiovascular;  Laterality: N/A;   RIGHT/LEFT HEART CATH AND CORONARY ANGIOGRAPHY N/A 02/25/2020   Procedure: RIGHT/LEFT HEART CATH AND CORONARY ANGIOGRAPHY;  Surgeon: Laurey Morale, MD;  Location: [facility] INVASIVE CV LAB;  Service: Cardiovascular;  Laterality: N/A;   THROMBECTOMY FEMORAL ARTERY Right 02/08/2022   Procedure: RIGHT LOWER THROMBECTOMY WITH VEIN PATCH;  Surgeon: Maeola Harman, MD;  Location: [facility] OR;  Service: Vascular;  Laterality: Right;   VEIN HARVEST Right 02/08/2022   Procedure: RIGHT LOWER SAPHENOUS VEIN HARVEST;  Surgeon: Maeola Harman, MD;  Location: [facility] OR;  Service: Vascular;  Laterality: Right;    Current Medications: Current Meds  Medication Sig   albuterol (VENTOLIN HFA) 108 (90 Base) MCG/ACT inhaler Inhale 2 puffs into the lungs every 6 (six) hours as needed for wheezing or shortness of breath.   amiodarone (PACERONE) 200 MG tablet Take 0.5 tablets (100 mg total) by mouth daily.   apixaban (ELIQUIS) 5 MG TABS tablet Take 1 tablet (5 mg total) by mouth 2 (two) times daily.   aspirin EC 81 MG tablet Take 1 tablet (81 mg total) by mouth daily at 6 (six) AM. Swallow whole.   dapagliflozin propanediol (FARXIGA) 10 MG TABS tablet Take 1 tablet (10 mg total) by mouth daily.   furosemide (LASIX) 40 MG tablet Take 1 tablet (40 mg total) by mouth 2 (two) times daily.   levothyroxine (SYNTHROID) 137 MCG tablet Take 137 mcg by mouth every morning.   Magnesium 250 MG TABS Take 250 mg by mouth daily.    oxyCODONE (OXY IR/ROXICODONE) 5 MG immediate release tablet Take 1 tablet (5 mg total) by mouth every 4 (four) hours as needed for moderate pain, severe pain or breakthrough pain.   potassium chloride SA (KLOR-CON M) 20 MEQ tablet Take 1 tablet (20 mEq total) by mouth daily.   rosuvastatin (CRESTOR) 20 MG tablet Take 1 tablet (20 mg total) by mouth daily.   sacubitril-valsartan (ENTRESTO) 24-26 MG Take 1 tablet by mouth 2 (two) times daily.     Allergies:   Bee pollen   Social History   Socioeconomic History   Marital status: Married    Spouse name: Not on file   Number of children: 0   Years of education: Not on file   Highest education level: Not on file  Occupational History   Occupation: disabled  Tobacco Use   Smoking status: Some Days    Packs/day: .25    Types: Cigarettes, E-cigarettes    Start date: 10/13/1982    Last attempt to quit: 09/19/2011    Years since quitting: 10.6  Passive exposure: Never   Smokeless tobacco: Never   Tobacco comments:    electronic cig for 4 months   Vaping Use   Vaping Use: Some days   Substances: Flavoring  Substance and Sexual Activity   Alcohol use: Not Currently    Comment: weekends   Drug use: No   Sexual activity: Not on file  Other Topics Concern   Not on file  Social History Narrative   Patient is right handed.   Patient seldom drinks caffeine.   Social Determinants of Health   Financial Resource Strain: Medium Risk (04/07/2022)   Overall Financial Resource Strain (CARDIA)    Difficulty of Paying Living Expenses: Somewhat hard  Food Insecurity: No Food Insecurity (04/20/2022)   Hunger Vital Sign    Worried About Running Out of Food in the Last Year: Never true    Ran Out of Food in the Last Year: Never true  Transportation Needs: No Transportation Needs (04/20/2022)   PRAPARE - Administrator, Civil Service (Medical): No    Lack of Transportation (Non-Medical): No  Physical Activity: Inactive (04/07/2022)    Exercise Vital Sign    Days of Exercise per Week: 0 days    Minutes of Exercise per Session: 0 min  Stress: No Stress Concern Present (04/07/2022)   [employer] of Occupational Health - Occupational Stress Questionnaire    Feeling of Stress : Not at all  Social Connections: Moderately Isolated (04/07/2022)   Social Connection and Isolation Panel [NHANES]    Frequency of Communication with Friends and Family: Twice a week    Frequency of Social Gatherings with Friends and Family: Never    Attends Religious Services: 1 to 4 times per year    Active Member of [employer] or Organizations: No    Attends Engineer, structural: Never    Marital Status: Married     Family History:  The patient's family history includes Cancer in his mother; Heart attack in his father; Heart disease in his sister; Heart failure in his father.  ROS:   Please see the history of present illness.  All other systems are reviewed and otherwise negative.    EKG(s)/Additional Testing   EKG:  EKG is ordered today, personally reviewed, demonstrating NSR 63bpm, atypical RBBB, QTc , nonspecific STTW changes.  CV Studies: Cardiac studies reviewed are outlined and summarized above. Otherwise please see EMR for full report.  Recent Labs: 03/01/2022: TSH 3.020 04/20/2022: ALT 11 04/23/2022: B Natriuretic Peptide 1,260.0; Hemoglobin 14.3; Platelets 295 04/24/2022: Magnesium 2.0 04/30/2022: BUN 29; Creatinine, Ser 3.11; Potassium 5.0; Sodium 137  Recent Lipid Panel    Component Value Date/Time   CHOL 103 03/01/2022 1629   TRIG 104 03/01/2022 1629   HDL 43 03/01/2022 1629   CHOLHDL 2.4 03/01/2022 1629   CHOLHDL 4.2 02/09/2022 0148   VLDL 13 02/09/2022 0148   LDLCALC 41 03/01/2022 1629    PHYSICAL EXAM:    VS:  BP (!) 110/56   Pulse 63   Ht 6' (1.829 m)   Wt 174 lb 12.8 oz (79.3 kg)   SpO2 97%   BMI 23.71 kg/m   BMI: Body mass index is 23.71 kg/m.  GEN: Well nourished, well developed male in no  acute distress HEENT: normocephalic, atraumatic Neck: no JVD, carotid bruits, or masses Cardiac: RRR; no murmurs, rubs, or gallops, no edema  Respiratory:  clear to auscultation bilaterally, normal work of breathing GI: soft, nontender, nondistended, + BS MS: no deformity or  atrophy Skin: warm and dry, no rash Neuro:  Alert and Oriented x 3, Strength and sensation are intact, follows commands Psych: euthymic mood, full affect  Wt Readings from Last 3 Encounters:  05/04/22 174 lb 12.8 oz (79.3 kg)  04/30/22 179 lb (81.2 kg)  04/24/22 168 lb 3.4 oz (76.3 kg)     ASSESSMENT & PLAN:   1. Chronic HFrEF/NICM - weight up slightly from recent discharge though down 5lb from PCP visit. Volume status looks OK today. Recent labs 04/30/22 showed worsening AKI on CKD with Cr from 1.91 to 3.11 - Entresto added at last hospitalization. He previously hovered in the 2.1-2.4 range earlier this year so baseline not totally clear at this time. Check labs today to guide next steps. Not on BB due to severe LV dysfunction per notes. He is not a good candidate for spironolactone given his renal issues. He reports financial concerns affording his medications in the future. Will refer him back to the advanced HF clinic for follow-up to assist with their social work team as well. Will otherwise arrange f/u here in 8 weeks locally in [place]. Reviewed 2g sodium restriction, 2L fluid restriction, daily weights with patient.  2. Paroxysmal atrial fibrillation - maintaining NSR on low dose amiodarone. Anticoagulated with Eliquis. Check CMET, thyroid with labs today. Further monitoring of organ systems while on amiodarone will be at discretion of primary cardiologist in follow-up.  3. LV apical thrombus - continue Eliquis. Not felt to be candidate for warfarin due to noncompliance issues. Dose remains OK for age, weight. Consider repeat limited echo in several months to reassess for resolution after compliance is  demonstrated. Will need to re-eval timing in follow-up.  4. PAD, HLD - has follow-up with [facility]. Remains on ASA, Eliquis. Get CBC with labs today given adherence to meds and generalized fatigue. Continue rosuvastatin, though if CrCl remains <20 may need to consider switch to atorvastatin in the future. Lipids have been managed by primary care in the past.  5. AKI on CKD - recheck labs today as above. Get BMET, Mg. Takes supplementation of both potassium and magnesium per med rec. Also pending OP nephrology evaluation per chart.  6. Mild MR, mild dilation of ascending aorta - follow clinically. Anticipate this finding will be followed up by repeat echocardiograms for #1.    Disposition: F/u with CHF clinic within 2 weeks, and [facility] ([facility] vs Dr. Wyline Mood) in 8 weeks.   Medication Adjustments/Labs and Tests Ordered: Current medicines are reviewed at length with the patient today.  Concerns regarding medicines are outlined above. Medication changes, Labs and Tests ordered today are summarized above and listed in the Patient Instructions accessible in Encounters.    Signed, Laurann Montana, PA-C  05/04/2022 1:13 PM    [facility] - [place] Location in [facility] [address] [place], [place] [address] Ph: [phone]; Fax [phone]

## 2022-05-04 ENCOUNTER — Other Ambulatory Visit
Admission: RE | Admit: 2022-05-04 | Discharge: 2022-05-04 | Disposition: A | Discharge status: Home / Self Care | Payer: Medicare HMO | Source: Ambulatory Visit | Attending: Physician Assistant | Admitting: Physician Assistant

## 2022-05-04 ENCOUNTER — Ambulatory Visit (INDEPENDENT_AMBULATORY_CARE_PROVIDER_SITE_OTHER): Payer: Medicare HMO | Admitting: Physician Assistant

## 2022-05-04 ENCOUNTER — Encounter: Payer: Self-pay | Admitting: Physician Assistant

## 2022-05-04 VITALS — BP 110/56 | HR 63 | Ht 72.0 in | Wt 174.8 lb

## 2022-05-04 DIAGNOSIS — I739 Peripheral vascular disease, unspecified: Secondary | ICD-10-CM

## 2022-05-04 DIAGNOSIS — I480 Paroxysmal atrial fibrillation: Secondary | ICD-10-CM | POA: Insufficient documentation

## 2022-05-04 DIAGNOSIS — E785 Hyperlipidemia, unspecified: Secondary | ICD-10-CM

## 2022-05-04 DIAGNOSIS — I513 Intracardiac thrombosis, not elsewhere classified: Secondary | ICD-10-CM

## 2022-05-04 DIAGNOSIS — I77810 Thoracic aortic ectasia: Secondary | ICD-10-CM

## 2022-05-04 DIAGNOSIS — N189 Chronic kidney disease, unspecified: Secondary | ICD-10-CM | POA: Insufficient documentation

## 2022-05-04 DIAGNOSIS — I340 Nonrheumatic mitral (valve) insufficiency: Secondary | ICD-10-CM

## 2022-05-04 DIAGNOSIS — I5022 Chronic systolic (congestive) heart failure: Secondary | ICD-10-CM | POA: Insufficient documentation

## 2022-05-04 DIAGNOSIS — N179 Acute kidney failure, unspecified: Secondary | ICD-10-CM

## 2022-05-04 DIAGNOSIS — I428 Other cardiomyopathies: Secondary | ICD-10-CM | POA: Diagnosis not present

## 2022-05-04 LAB — TSH: TSH: 2.924 u[IU]/mL (ref 0.350–4.500)

## 2022-05-04 LAB — COMPREHENSIVE METABOLIC PANEL
ALT: 10 U/L (ref 0–44)
AST: 14 U/L — ABNORMAL LOW (ref 15–41)
Albumin: 3.3 g/dL — ABNORMAL LOW (ref 3.5–5.0)
Alkaline Phosphatase: 94 U/L (ref 38–126)
Anion gap: 7 (ref 5–15)
BUN: 39 mg/dL — ABNORMAL HIGH (ref 8–23)
CO2: 27 mmol/L (ref 22–32)
Calcium: 8.7 mg/dL — ABNORMAL LOW (ref 8.9–10.3)
Chloride: 105 mmol/L (ref 98–111)
Creatinine, Ser: 2.24 mg/dL — ABNORMAL HIGH (ref 0.61–1.24)
GFR, Estimated: 32 mL/min — ABNORMAL LOW (ref >60)
Glucose, Bld: 122 mg/dL — ABNORMAL HIGH (ref 70–99)
Potassium: 4.3 mmol/L (ref 3.5–5.1)
Sodium: 139 mmol/L (ref 135–145)
Total Bilirubin: 0.5 mg/dL (ref 0.3–1.2)
Total Protein: 7.5 g/dL (ref 6.5–8.1)

## 2022-05-04 LAB — MAGNESIUM: Magnesium: 1.9 mg/dL (ref 1.7–2.4)

## 2022-05-04 LAB — CBC
HCT: 37.7 % — ABNORMAL LOW (ref 39.0–52.0)
Hemoglobin: 11.7 g/dL — ABNORMAL LOW (ref 13.0–17.0)
MCH: 30.6 pg (ref 26.0–34.0)
MCHC: 31 g/dL (ref 30.0–36.0)
MCV: 98.7 fL (ref 80.0–100.0)
Platelets: 247 10*3/uL (ref 150–400)
RBC: 3.82 MIL/uL — ABNORMAL LOW (ref 4.22–5.81)
RDW: 17.2 % — ABNORMAL HIGH (ref 11.5–15.5)
WBC: 6.1 10*3/uL (ref 4.0–10.5)
nRBC: 0 % (ref 0.0–0.2)

## 2022-05-04 LAB — T4, FREE: Free T4: 1.61 ng/dL — ABNORMAL HIGH (ref 0.61–1.12)

## 2022-05-04 NOTE — Patient Instructions (Addendum)
Medication Instructions:  Your physician recommends that you continue on your current medications as directed. Please refer to the Current Medication list given to you today.  *If you need a refill on your cardiac medications before your next appointment, please call your pharmacy*   Lab Work: Your physician recommends that you return for lab work in: Today ( CMET, TSH, Free T4, CBC)   If you have labs (blood work) drawn today and your tests are completely normal, you will receive your results only by: MyChart Message (if you have MyChart) OR A paper copy in the mail If you have any lab test that is abnormal or we need to change your treatment, we will call you to review the results.   Testing/Procedures: NONE    Follow-Up: At [facility], you and your health needs are our priority.  As part of our continuing mission to provide you with exceptional heart care, we have created designated Provider Care Teams.  These Care Teams include your primary Cardiologist (physician) and Advanced Practice Providers (APPs -  Physician Assistants and Nurse Practitioners) who all work together to provide you with the care you need, when you need it.  We recommend signing up for the patient portal called "MyChart".  Sign up information is provided on this After Visit Summary.  MyChart is used to connect with patients for Virtual Visits (Telemedicine).  Patients are able to view lab/test results, encounter notes, upcoming appointments, etc.  Non-urgent messages can be sent to your provider as well.   To learn more about what you can do with MyChart, go to [email or web address].    Your next appointment:   8 week(s)  Provider:   You may see Dina Rich, MD or one of the following Advanced Practice Providers on your designated Care Team:   Randall An, PA-C  Jacolyn Reedy, [place]     Other Instructions  May 6 at 11am with the Advanced Heart Failure clinic.   Thank you for choosing  [facility]!

## 2022-05-05 ENCOUNTER — Telehealth: Payer: Self-pay

## 2022-05-05 NOTE — Telephone Encounter (Signed)
Called [facility] who stated that patient had a new patient appt scheduled for 04/07/22 for which the patient no showed. [facility] will reach back out to the patient and reschedule him.

## 2022-05-05 NOTE — Telephone Encounter (Signed)
-----   Message from Laurann Montana, [place] sent at 05/05/2022  8:15 AM EDT ----- Please let pt know labs are overall relatively stable. Kidney function is still abnormal at 2.24 but improved from 3.11. Looking back to trends, although 1.91 at discharge from the hospital (prior to [facility]), he had settled to the 2.3-2.4 range earlier this year so this may reflect his new baseline. I reviewed with CHF pharmD who felt that since CrCl 79ml/min, OK to continue Entresto as this may actually help kidneys in the long run, but we do need to continue to follow closely. Can we check on the nephrology referral to Dr. Wolfgang Phoenix? Medicine team placed this earlier this year. VERY important to keep follow-up with advanced HF clinic as scheduled at which time this can be trended further.  Remind patient to avoid OTC NSAIDS - ie no ibuprofen, motrin, Aleve, naproxen.  Mild anemia noted with Hgb 11.9 - was 14.3 on 4/5 which seemed unusual since all other Hgb have run in the 11 range, so seems stable. Notify for any unusual bleeding or dark stools.

## 2022-05-05 NOTE — Telephone Encounter (Signed)
Patient notified and verbalized understanding. Patient stated he had not received a call from Dr. Lucio Edward office for an appointment. Will follow up with nephrology office about referral. Patient had no questions or concerns at this time.

## 2022-05-17 ENCOUNTER — Telehealth (HOSPITAL_COMMUNITY): Payer: Self-pay

## 2022-05-17 NOTE — Telephone Encounter (Signed)
Called both phone numbers in patient's chart and was unable to leave a voice message to confirm/remind patient of their appointment at the Advanced [facility] Failure Clinic on 05/18/22.

## 2022-05-18 ENCOUNTER — Encounter (HOSPITAL_COMMUNITY): Payer: Medicare HMO

## 2022-05-24 ENCOUNTER — Encounter (HOSPITAL_COMMUNITY): Payer: Medicare HMO

## 2022-05-31 ENCOUNTER — Ambulatory Visit: Payer: Medicare HMO | Admitting: Family Medicine

## 2022-06-02 ENCOUNTER — Ambulatory Visit: Payer: Medicare HMO | Admitting: Family Medicine

## 2022-06-02 ENCOUNTER — Other Ambulatory Visit (HOSPITAL_COMMUNITY): Payer: Self-pay | Admitting: Cardiology

## 2022-06-03 ENCOUNTER — Telehealth (HOSPITAL_COMMUNITY): Payer: Self-pay

## 2022-06-03 NOTE — Telephone Encounter (Signed)
Called both patient's number's and was unable to leave a voice message to confirm/remind patient of their appointment at the [facility] Clinic on 06/04/22.

## 2022-06-04 ENCOUNTER — Encounter (HOSPITAL_COMMUNITY): Payer: Medicare HMO

## 2022-06-07 ENCOUNTER — Ambulatory Visit: Payer: Medicare HMO | Admitting: Family Medicine

## 2022-06-09 ENCOUNTER — Ambulatory Visit: Payer: Medicare HMO

## 2022-06-09 ENCOUNTER — Ambulatory Visit: Payer: Medicare HMO | Admitting: Family Medicine

## 2022-06-09 ENCOUNTER — Ambulatory Visit (HOSPITAL_COMMUNITY): Payer: Medicare HMO

## 2022-06-09 ENCOUNTER — Ambulatory Visit (HOSPITAL_COMMUNITY): Payer: Medicare HMO | Attending: Vascular Surgery

## 2022-06-17 ENCOUNTER — Telehealth (HOSPITAL_COMMUNITY): Payer: Self-pay

## 2022-06-17 ENCOUNTER — Encounter: Payer: Self-pay | Admitting: Student

## 2022-06-17 ENCOUNTER — Ambulatory Visit: Payer: Medicare HMO | Attending: Student | Admitting: Student

## 2022-06-17 NOTE — Telephone Encounter (Signed)
Called and spoke to patient's wife to confirm/remind patient of their appointment at the [facility] Clinic on 06/18/22.   Patient reminded to bring all medications and/or complete list.  Confirmed patient has transportation. Gave directions, instructed to utilize valet parking.  Confirmed appointment prior to ending call.

## 2022-06-17 NOTE — Progress Notes (Deleted)
   Cardiology Office Note    Date:  06/17/2022  ID:  ZEID RUOFF, DOB 05-01-1955, MRN [id number] Cardiologist: Dina Rich, MD    History of Present Illness:    [name] [name] is a 67 y.o. male with past medical history of HFrEF/NICM (EF 15% in 2013 with cath showing normal cors, EF 30-35% by repeat echo in 2015 with no outpatient follow-up from 2016 to 04/2019, EF at 25-30% by echo in 04/2019 and repeat cath in 02/2020 showing normal cors, EF < 20% by echo in 01/2022), apical thrombus (diagnosed by echocardiogram in 04/2019), paroxysmal atrial fibrillation, PAD (s/p right lower extremity thromboembolectomy/right tibioperoneal trunk endarterectomy in 01/2022), HTN, HLD, Type 2 DM and Stage 3 CKD who presents to the office today for 6-week follow-up.  He was examined by Ronie Spies, PA-C in 04/2022 following a recent hospitalization for an acute CHF exacerbation. He reported overall doing well at that time and his weight was stable at 174 lbs. Follow-up labs were recommended given his variable renal function he was not on a beta-blocker due to severe LV dysfunction. He was also not felt to be a good candidate for Spironolactone given his renal issues. He was referred back to the Advanced Heart Failure clinic. Notes in the chart mention that he was referred to Nephrology but no showed for the appointment and he no showed for his appointment with AHF on 06/09/2022.  ROS: ***  Studies Reviewed:   EKG: EKG is*** ordered today and demonstrates ***  ***  Risk Assessment/Calculations:   {Does this patient have ATRIAL FIBRILLATION?:[phone]} No BP recorded.  {Refresh Note OR Click here to enter BP  :1}***         Physical Exam:   VS:  There were no vitals taken for this visit.   Wt Readings from Last 3 Encounters:  05/04/22 174 lb 12.8 oz (79.3 kg)  04/30/22 179 lb (81.2 kg)  04/24/22 168 lb 3.4 oz (76.3 kg)     GEN: Well nourished, well developed in no acute distress NECK: No  JVD; No carotid bruits CARDIAC: ***RRR, no murmurs, rubs, gallops RESPIRATORY:  Clear to auscultation without rales, wheezing or rhonchi  ABDOMEN: Appears non-distended. No obvious abdominal masses. EXTREMITIES: No clubbing or cyanosis. No edema.  Distal pedal pulses are 2+ bilaterally.   Assessment and Plan:      {Are you ordering a CV Procedure (e.g. stress test, cath, DCCV, TEE, etc)?   Press F2        :[id number]}   Signed, Ellsworth Lennox, PA-C

## 2022-06-18 ENCOUNTER — Encounter: Payer: Self-pay | Admitting: Student

## 2022-06-18 ENCOUNTER — Encounter (HOSPITAL_COMMUNITY): Payer: Medicare HMO

## 2022-06-21 ENCOUNTER — Encounter: Payer: Self-pay | Admitting: Student

## 2022-06-30 ENCOUNTER — Ambulatory Visit: Payer: Medicare HMO | Admitting: Physician Assistant

## 2022-06-30 ENCOUNTER — Ambulatory Visit (INDEPENDENT_AMBULATORY_CARE_PROVIDER_SITE_OTHER)
Admission: RE | Admit: 2022-06-30 | Discharge: 2022-06-30 | Disposition: A | Discharge status: Home / Self Care | Payer: Medicare HMO | Source: Ambulatory Visit | Attending: Vascular Surgery | Admitting: Vascular Surgery

## 2022-06-30 ENCOUNTER — Ambulatory Visit (HOSPITAL_COMMUNITY)
Admission: RE | Admit: 2022-06-30 | Discharge: 2022-06-30 | Disposition: A | Discharge status: Home / Self Care | Payer: Medicare HMO | Source: Ambulatory Visit | Attending: Vascular Surgery | Admitting: Vascular Surgery

## 2022-06-30 VITALS — BP 107/71 | HR 84 | Temp 97.6°F | Resp 18 | Ht 72.0 in | Wt 171.0 lb

## 2022-06-30 DIAGNOSIS — I998 Other disorder of circulatory system: Secondary | ICD-10-CM

## 2022-06-30 NOTE — Progress Notes (Signed)
[facility] [place] HISTORY AND PHYSICAL   History of Present Illness:  Patient is a 67 y.o. year old male who presents for evaluation of right LE.   s/p right leg thrombectomy via below the knee popliteal approach with right TP trunk endarterectomy and vein patch angioplasty by Dr. Randie Heinz on 02/08/2022 due to acute right lower extremity ischemia with sensory loss secondary to cardiac thrombus.   He continues to complaint of numbness in the right foot and toes, he feels like he has blister, or pockets of fluid in the plantar foot and toes.  He continues to have edema if he sits to long in a dependent position.   He is medically managed on Eliquis, Crestor and ASA daily.  Past Medical History:  Diagnosis Date   Anxiety    Apical mural thrombus    Ascending aorta dilatation (HCC)    Chronic HFrEF (heart failure with reduced ejection fraction) (HCC)    a. EF 15% in 2013 with cath showing normal cors b. EF 30-35% by repeat echo in 2015   CKD stage 3b, GFR 30-44 ml/min (HCC)    Cocaine use    Diabetes mellitus    History of noncompliance with medical treatment, presenting hazards to health    Hyperlipidemia    Hypertension    Mitral regurgitation    NICM (nonischemic cardiomyopathy) (HCC)    PAD (peripheral artery disease) (HCC)    PAF (paroxysmal atrial fibrillation) (HCC)     Past Surgical History:  Procedure Laterality Date   COLONOSCOPY  01/04/2012   Procedure: COLONOSCOPY;  Surgeon: West Bali, MD;  Location[facility];  Service: Endoscopy;  Laterality: N/A;  1:30 PM   ENDARTERECTOMY POPLITEAL Right 02/08/2022   Procedure: TIBIAL PERONEAL ENDARTERECTOMY;  Surgeon: Maeola Harman, MD;  Location: [facility] OR;  Service: Vascular;  Laterality: Right;   HERNIA REPAIR     LEFT AND RIGHT HEART CATHETERIZATION WITH CORONARY ANGIOGRAM N/A 07/09/2011   Procedure: LEFT AND RIGHT HEART CATHETERIZATION WITH CORONARY ANGIOGRAM;  Surgeon: Peter M Swaziland, MD;  Location:  [facility] CATH LAB;  Service: Cardiovascular;  Laterality: N/A;   RIGHT/LEFT HEART CATH AND CORONARY ANGIOGRAPHY N/A 02/25/2020   Procedure: RIGHT/LEFT HEART CATH AND CORONARY ANGIOGRAPHY;  Surgeon: Laurey Morale, MD;  Location: [facility] INVASIVE CV LAB;  Service: Cardiovascular;  Laterality: N/A;   THROMBECTOMY FEMORAL ARTERY Right 02/08/2022   Procedure: RIGHT LOWER THROMBECTOMY WITH VEIN PATCH;  Surgeon: Maeola Harman, MD;  Location: [facility] OR;  Service: Vascular;  Laterality: Right;   VEIN HARVEST Right 02/08/2022   Procedure: RIGHT LOWER SAPHENOUS VEIN HARVEST;  Surgeon: Maeola Harman, MD;  Location: [facility] OR;  Service: Vascular;  Laterality: Right;    ROS:   General:  No weight loss, Fever, chills  HEENT: No recent headaches, no nasal bleeding, no visual changes, no sore throat  Neurologic: No dizziness, blackouts, seizures. No recent symptoms of stroke or mini- stroke. No recent episodes of slurred speech, or temporary blindness.  Cardiac: No recent episodes of chest pain/pressure, no shortness of breath at rest.  No shortness of breath with exertion.  Denies history of atrial fibrillation or irregular heartbeat  Vascular: No history of rest pain in feet.  No history of claudication.  No history of non-healing ulcer, No history of DVT   Pulmonary: No home oxygen, no productive cough, no hemoptysis,  No asthma or wheezing  Musculoskeletal:  [ ]  Arthritis, [ ]  Low back pain,  [ ]   Joint pain  Hematologic:No history of hypercoagulable state.  No history of easy bleeding.  No history of anemia  Gastrointestinal: No hematochezia or melena,  No gastroesophageal reflux, no trouble swallowing  Urinary: [ ]  chronic Kidney disease, [ ]  on HD - [ ]  MWF or [ ]  TTHS, [ ]  Burning with urination, [ ]  Frequent urination, [ ]  Difficulty urinating;   Skin: No rashes  Psychological: No history of anxiety,  No history of depression  Social History Social History   Tobacco Use   Smoking  status: Some Days    Packs/day: .25    Types: Cigarettes, E-cigarettes    Start date: 10/13/1982    Last attempt to quit: 09/19/2011    Years since quitting: 10.7    Passive exposure: Never   Smokeless tobacco: Never   Tobacco comments:    electronic cig for 4 months   Vaping Use   Vaping Use: Some days   Substances: Flavoring  Substance Use Topics   Alcohol use: Not Currently    Comment: weekends   Drug use: No    Family History Family History  Problem Relation Age of Onset   Heart attack Father    Heart failure Father    Cancer Mother        Multiple myeloma   Heart disease Sister     Allergies  Allergies  Allergen Reactions   Bee Pollen Swelling     Current Outpatient Medications  Medication Sig Dispense Refill   albuterol (VENTOLIN HFA) 108 (90 Base) MCG/ACT inhaler Inhale 2 puffs into the lungs every 6 (six) hours as needed for wheezing or shortness of breath. 8 g 2   amiodarone (PACERONE) 200 MG tablet TAKE 1/2 TABLET EVERY DAY 45 tablet 3   apixaban (ELIQUIS) 5 MG TABS tablet Take 1 tablet (5 mg total) by mouth 2 (two) times daily. 60 tablet 0   aspirin EC 81 MG tablet Take 1 tablet (81 mg total) by mouth daily at 6 (six) AM. Swallow whole. 30 tablet 12   dapagliflozin propanediol (FARXIGA) 10 MG TABS tablet Take 1 tablet (10 mg total) by mouth daily. 30 tablet 11   furosemide (LASIX) 40 MG tablet Take 1 tablet (40 mg total) by mouth 2 (two) times daily. 30 tablet 11   levothyroxine (SYNTHROID) 137 MCG tablet Take 137 mcg by mouth every morning.     Magnesium 250 MG TABS Take 250 mg by mouth daily.     oxyCODONE (OXY IR/ROXICODONE) 5 MG immediate release tablet Take 1 tablet (5 mg total) by mouth every 4 (four) hours as needed for moderate pain, severe pain or breakthrough pain. 10 tablet 0   pantoprazole (PROTONIX) 40 MG tablet Take 1 tablet (40 mg total) by mouth daily. 30 tablet 0   polyethylene glycol powder (GLYCOLAX/MIRALAX) 17 GM/SCOOP powder Take 17 g by  mouth daily. 500 g 0   rosuvastatin (CRESTOR) 20 MG tablet Take 1 tablet (20 mg total) by mouth daily. 30 tablet 0   senna-docusate (SENOKOT-S) 8.6-50 MG tablet Take 1 tablet by mouth at bedtime as needed for mild constipation. 30 tablet 0   tamsulosin (FLOMAX) 0.4 MG CAPS capsule Take 1 capsule (0.4 mg total) by mouth daily. 30 capsule 3   potassium chloride SA (KLOR-CON M) 20 MEQ tablet Take 1 tablet (20 mEq total) by mouth daily. 30 tablet 0   No current facility-administered medications for this visit.    Physical Examination  Vitals:   06/30/22 1229  BP:  107/71  Pulse: 84  Resp: 18  Temp: 97.6 F (36.4 C)  TempSrc: Temporal  SpO2: 99%  Weight: 171 lb (77.6 kg)  Height: 6' (1.829 m)    Body mass index is 23.19 kg/m.  General:  Alert and oriented, no acute distress HEENT: Normal Neck: No bruit or JVD Pulmonary: Clear to auscultation bilaterally Cardiac: Regular Rate and Rhythm without murmur Abdomen: Soft, non-tender, non-distended, no mass, no scars Skin: No rash, peeling, dry skin, no open wounds Extremity Pulses:  2+ radial, brachial, femoral, palpable/ multiphasic doppler signals dorsalis pedis, posterior tibial pulses bilaterally Musculoskeletal:very minimal edema  Neurologic: Upper and lower extremity motor 5/5 and symmetric  DATA:   ABI Findings:  +---------+------------------+-----+-----------+--------+  Right   Rt Pressure (mmHg)IndexWaveform   Comment   +---------+------------------+-----+-----------+--------+  Brachial 101                                         +---------+------------------+-----+-----------+--------+  PTA     87                0.86 multiphasic          +---------+------------------+-----+-----------+--------+  DP      105               1.04 multiphasic          +---------+------------------+-----+-----------+--------+  Great Toe55                0.54 Normal                +---------+------------------+-----+-----------+--------+   +---------+------------------+-----+-----------+-------+  Left    Lt Pressure (mmHg)IndexWaveform   Comment  +---------+------------------+-----+-----------+-------+  Brachial 94                                         +---------+------------------+-----+-----------+-------+  PTA     123               1.22 multiphasic         +---------+------------------+-----+-----------+-------+  DP      103               1.02 multiphasic         +---------+------------------+-----+-----------+-------+  Great Toe36                0.36 Normal              +---------+------------------+-----+-----------+-------+   +-------+-----------+-----------+------------+------------+  ABI/TBIToday's ABIToday's TBIPrevious ABIPrevious TBI  +-------+-----------+-----------+------------+------------+  Right 1.04       0.54       0.36        0             +-------+-----------+-----------+------------+------------+  Left  1.22       0.36       1.33                      +-------+-----------+-----------+------------+------------+     Arterial wall calcification precludes accurate ankle pressures and ABIs.  PPG tracings display appropriate pulsatility.  Right ABIs appear increased. Left ABIs appear decreased. Left ABI is  decreased from Noncompressible category to normal range.    Summary:  Right: Resting right ankle-brachial index is within normal range. The  right toe-brachial index is abnormal.   Left: Resting left ankle-brachial index is within  normal range. The left  toe-brachial index is abnormal.   +-----------+--------+-----+--------+---------+--------+  RIGHT     PSV cm/sRatioStenosisWaveform Comments  +-----------+--------+-----+--------+---------+--------+  CFA Distal 65                   triphasic          +-----------+--------+-----+--------+---------+--------+  DFA       44                    triphasic          +-----------+--------+-----+--------+---------+--------+  SFA Prox   51                   triphasic          +-----------+--------+-----+--------+---------+--------+  SFA Mid    71                   triphasic          +-----------+--------+-----+--------+---------+--------+  SFA Distal 70                   triphasic          +-----------+--------+-----+--------+---------+--------+  POP Prox   57                   triphasic          +-----------+--------+-----+--------+---------+--------+  POP Distal 57                   triphasic          +-----------+--------+-----+--------+---------+--------+  ATA Distal 53                   triphasic          +-----------+--------+-----+--------+---------+--------+  PTA Distal 81                   triphasic          +-----------+--------+-----+--------+---------+--------+  PERO Distal33                   triphasic          +-----------+--------+-----+--------+---------+--------+     Summary:  Right: Right lower extremity arteries are patent.     ASSESSMENT/PLAN:   He has palpable/doppler triphasic flow B LE He is  s/p right leg thrombectomy via below the knee popliteal approach with right TP trunk endarterectomy and vein patch angioplasty by Dr. Randie Heinz on 02/08/2022 due to acute right lower extremity ischemia with sensory loss secondary to cardiac thrombus.    The duplex and the ABI's show normal triphasic arterial flow.  He has numbness in the right foot and toes.  He also has occasional edema.  I gave a handout to demonstrate proper elevation of the right LE as well as mild knee high compression.  He will continue to increase his activity and alternate with elevation to help with the edema.   Continue medical management and f/u in 6 months for repeat studies.    The numbness is likely from nerve damage due to ischemia.  He may regain some sensation and  then again he may not.         Mosetta Pigeon PA-C [facility] [place] Office: [phone]  MD in clinic [place]

## 2022-07-01 LAB — VAS US ABI WITH/WO TBI
Left ABI: 1.22
Right ABI: 1.04

## 2022-07-12 ENCOUNTER — Other Ambulatory Visit: Payer: Self-pay

## 2022-07-12 DIAGNOSIS — I998 Other disorder of circulatory system: Secondary | ICD-10-CM

## 2022-07-15 ENCOUNTER — Telehealth (HOSPITAL_COMMUNITY): Payer: Self-pay

## 2022-07-15 NOTE — Telephone Encounter (Signed)
Called and was unable to leave patient a voice message to confirm/remind patient of their appointment at the [facility] on 07/16/22.   .   

## 2022-07-16 ENCOUNTER — Encounter (HOSPITAL_COMMUNITY): Payer: Medicare HMO

## 2022-07-17 ENCOUNTER — Other Ambulatory Visit: Payer: Self-pay | Admitting: Cardiology

## 2022-07-26 ENCOUNTER — Other Ambulatory Visit: Payer: Self-pay | Admitting: Cardiology

## 2022-07-26 NOTE — Telephone Encounter (Signed)
Prescription refill request for Eliquis received. Indication: PAF/LV Thrombus Last office visit: 05/04/22  D Dunn PA-C Scr: 2.24 on 05/04/22  Epic Age: 67 Weight: 79.3kg  Based on above findings Eliquis 5mg  twice daily is the appropriate dose.  Refill approved.

## 2022-08-08 ENCOUNTER — Encounter (HOSPITAL_COMMUNITY): Payer: Self-pay

## 2022-08-08 ENCOUNTER — Other Ambulatory Visit: Payer: Self-pay

## 2022-08-08 ENCOUNTER — Emergency Department (HOSPITAL_COMMUNITY): Payer: Medicare HMO

## 2022-08-08 ENCOUNTER — Inpatient Hospital Stay (HOSPITAL_COMMUNITY)
Admission: EM | Admit: 2022-08-08 | Discharge: 2022-08-19 | DRG: 286 | Disposition: A | Discharge status: Home / Self Care | Payer: Medicare HMO | Attending: Internal Medicine | Admitting: Internal Medicine

## 2022-08-08 DIAGNOSIS — Z91199 Patient's noncompliance with other medical treatment and regimen due to unspecified reason: Secondary | ICD-10-CM

## 2022-08-08 DIAGNOSIS — E1121 Type 2 diabetes mellitus with diabetic nephropathy: Secondary | ICD-10-CM | POA: Diagnosis not present

## 2022-08-08 DIAGNOSIS — E039 Hypothyroidism, unspecified: Secondary | ICD-10-CM | POA: Diagnosis not present

## 2022-08-08 DIAGNOSIS — Z7189 Other specified counseling: Secondary | ICD-10-CM | POA: Diagnosis not present

## 2022-08-08 DIAGNOSIS — I472 Ventricular tachycardia, unspecified: Secondary | ICD-10-CM | POA: Diagnosis present

## 2022-08-08 DIAGNOSIS — I5082 Biventricular heart failure: Secondary | ICD-10-CM | POA: Diagnosis present

## 2022-08-08 DIAGNOSIS — R918 Other nonspecific abnormal finding of lung field: Secondary | ICD-10-CM | POA: Diagnosis not present

## 2022-08-08 DIAGNOSIS — I5084 End stage heart failure: Secondary | ICD-10-CM | POA: Diagnosis present

## 2022-08-08 DIAGNOSIS — E871 Hypo-osmolality and hyponatremia: Secondary | ICD-10-CM | POA: Diagnosis not present

## 2022-08-08 DIAGNOSIS — R001 Bradycardia, unspecified: Secondary | ICD-10-CM | POA: Diagnosis present

## 2022-08-08 DIAGNOSIS — E1140 Type 2 diabetes mellitus with diabetic neuropathy, unspecified: Secondary | ICD-10-CM | POA: Diagnosis present

## 2022-08-08 DIAGNOSIS — I110 Hypertensive heart disease with heart failure: Secondary | ICD-10-CM | POA: Diagnosis not present

## 2022-08-08 DIAGNOSIS — Z1152 Encounter for screening for COVID-19: Secondary | ICD-10-CM

## 2022-08-08 DIAGNOSIS — N189 Chronic kidney disease, unspecified: Secondary | ICD-10-CM | POA: Diagnosis not present

## 2022-08-08 DIAGNOSIS — Z7982 Long term (current) use of aspirin: Secondary | ICD-10-CM

## 2022-08-08 DIAGNOSIS — N1831 Chronic kidney disease, stage 3a: Secondary | ICD-10-CM | POA: Diagnosis not present

## 2022-08-08 DIAGNOSIS — Z8249 Family history of ischemic heart disease and other diseases of the circulatory system: Secondary | ICD-10-CM

## 2022-08-08 DIAGNOSIS — Z7901 Long term (current) use of anticoagulants: Secondary | ICD-10-CM

## 2022-08-08 DIAGNOSIS — E876 Hypokalemia: Secondary | ICD-10-CM | POA: Diagnosis present

## 2022-08-08 DIAGNOSIS — I509 Heart failure, unspecified: Secondary | ICD-10-CM | POA: Diagnosis not present

## 2022-08-08 DIAGNOSIS — E1165 Type 2 diabetes mellitus with hyperglycemia: Secondary | ICD-10-CM | POA: Diagnosis present

## 2022-08-08 DIAGNOSIS — G4733 Obstructive sleep apnea (adult) (pediatric): Secondary | ICD-10-CM | POA: Diagnosis present

## 2022-08-08 DIAGNOSIS — E782 Mixed hyperlipidemia: Secondary | ICD-10-CM

## 2022-08-08 DIAGNOSIS — J9601 Acute respiratory failure with hypoxia: Secondary | ICD-10-CM | POA: Diagnosis not present

## 2022-08-08 DIAGNOSIS — I428 Other cardiomyopathies: Secondary | ICD-10-CM | POA: Diagnosis not present

## 2022-08-08 DIAGNOSIS — E875 Hyperkalemia: Secondary | ICD-10-CM | POA: Diagnosis present

## 2022-08-08 DIAGNOSIS — Z79890 Hormone replacement therapy (postmenopausal): Secondary | ICD-10-CM

## 2022-08-08 DIAGNOSIS — N179 Acute kidney failure, unspecified: Secondary | ICD-10-CM | POA: Diagnosis not present

## 2022-08-08 DIAGNOSIS — Z86718 Personal history of other venous thrombosis and embolism: Secondary | ICD-10-CM | POA: Diagnosis not present

## 2022-08-08 DIAGNOSIS — I4510 Unspecified right bundle-branch block: Secondary | ICD-10-CM | POA: Diagnosis present

## 2022-08-08 DIAGNOSIS — E1122 Type 2 diabetes mellitus with diabetic chronic kidney disease: Secondary | ICD-10-CM | POA: Diagnosis present

## 2022-08-08 DIAGNOSIS — Z7984 Long term (current) use of oral hypoglycemic drugs: Secondary | ICD-10-CM

## 2022-08-08 DIAGNOSIS — Z807 Family history of other malignant neoplasms of lymphoid, hematopoietic and related tissues: Secondary | ICD-10-CM

## 2022-08-08 DIAGNOSIS — Z91030 Bee allergy status: Secondary | ICD-10-CM

## 2022-08-08 DIAGNOSIS — I480 Paroxysmal atrial fibrillation: Secondary | ICD-10-CM | POA: Diagnosis not present

## 2022-08-08 DIAGNOSIS — N1832 Chronic kidney disease, stage 3b: Secondary | ICD-10-CM | POA: Diagnosis not present

## 2022-08-08 DIAGNOSIS — I272 Pulmonary hypertension, unspecified: Secondary | ICD-10-CM | POA: Diagnosis not present

## 2022-08-08 DIAGNOSIS — Z4682 Encounter for fitting and adjustment of non-vascular catheter: Secondary | ICD-10-CM | POA: Diagnosis not present

## 2022-08-08 DIAGNOSIS — E785 Hyperlipidemia, unspecified: Secondary | ICD-10-CM | POA: Diagnosis not present

## 2022-08-08 DIAGNOSIS — I5023 Acute on chronic systolic (congestive) heart failure: Secondary | ICD-10-CM | POA: Diagnosis not present

## 2022-08-08 DIAGNOSIS — Z515 Encounter for palliative care: Secondary | ICD-10-CM | POA: Diagnosis not present

## 2022-08-08 DIAGNOSIS — Z91148 Patient's other noncompliance with medication regimen for other reason: Secondary | ICD-10-CM

## 2022-08-08 DIAGNOSIS — D631 Anemia in chronic kidney disease: Secondary | ICD-10-CM | POA: Diagnosis not present

## 2022-08-08 DIAGNOSIS — J90 Pleural effusion, not elsewhere classified: Secondary | ICD-10-CM | POA: Diagnosis not present

## 2022-08-08 DIAGNOSIS — R0989 Other specified symptoms and signs involving the circulatory and respiratory systems: Secondary | ICD-10-CM | POA: Diagnosis not present

## 2022-08-08 DIAGNOSIS — Z79899 Other long term (current) drug therapy: Secondary | ICD-10-CM

## 2022-08-08 DIAGNOSIS — I130 Hypertensive heart and chronic kidney disease with heart failure and stage 1 through stage 4 chronic kidney disease, or unspecified chronic kidney disease: Principal | ICD-10-CM | POA: Diagnosis present

## 2022-08-08 DIAGNOSIS — I081 Rheumatic disorders of both mitral and tricuspid valves: Secondary | ICD-10-CM | POA: Diagnosis present

## 2022-08-08 DIAGNOSIS — R570 Cardiogenic shock: Secondary | ICD-10-CM | POA: Diagnosis not present

## 2022-08-08 DIAGNOSIS — I2510 Atherosclerotic heart disease of native coronary artery without angina pectoris: Secondary | ICD-10-CM | POA: Diagnosis present

## 2022-08-08 DIAGNOSIS — F17200 Nicotine dependence, unspecified, uncomplicated: Secondary | ICD-10-CM | POA: Diagnosis not present

## 2022-08-08 DIAGNOSIS — E1169 Type 2 diabetes mellitus with other specified complication: Secondary | ICD-10-CM | POA: Diagnosis present

## 2022-08-08 DIAGNOSIS — I517 Cardiomegaly: Secondary | ICD-10-CM | POA: Diagnosis not present

## 2022-08-08 DIAGNOSIS — F17210 Nicotine dependence, cigarettes, uncomplicated: Secondary | ICD-10-CM | POA: Diagnosis present

## 2022-08-08 DIAGNOSIS — R0600 Dyspnea, unspecified: Secondary | ICD-10-CM | POA: Diagnosis not present

## 2022-08-08 DIAGNOSIS — R0602 Shortness of breath: Secondary | ICD-10-CM | POA: Diagnosis not present

## 2022-08-08 DIAGNOSIS — Z452 Encounter for adjustment and management of vascular access device: Secondary | ICD-10-CM | POA: Diagnosis not present

## 2022-08-08 LAB — CBC WITH DIFFERENTIAL/PLATELET
Abs Immature Granulocytes: 0.01 10*3/uL (ref 0.00–0.07)
Basophils Absolute: 0 10*3/uL (ref 0.0–0.1)
Basophils Relative: 0 %
Eosinophils Absolute: 0 10*3/uL (ref 0.0–0.5)
Eosinophils Relative: 0 %
HCT: 38 % — ABNORMAL LOW (ref 39.0–52.0)
Hemoglobin: 12.4 g/dL — ABNORMAL LOW (ref 13.0–17.0)
Immature Granulocytes: 0 %
Lymphocytes Relative: 16 %
Lymphs Abs: 1.4 10*3/uL (ref 0.7–4.0)
MCH: 32.6 pg (ref 26.0–34.0)
MCHC: 32.6 g/dL (ref 30.0–36.0)
MCV: 100 fL (ref 80.0–100.0)
Monocytes Absolute: 0.6 10*3/uL (ref 0.1–1.0)
Monocytes Relative: 8 %
Neutro Abs: 6.4 10*3/uL (ref 1.7–7.7)
Neutrophils Relative %: 76 %
Platelets: 264 10*3/uL (ref 150–400)
RBC: 3.8 MIL/uL — ABNORMAL LOW (ref 4.22–5.81)
RDW: 15.9 % — ABNORMAL HIGH (ref 11.5–15.5)
WBC: 8.4 10*3/uL (ref 4.0–10.5)
nRBC: 0 % (ref 0.0–0.2)

## 2022-08-08 LAB — COMPREHENSIVE METABOLIC PANEL
ALT: 14 U/L (ref 0–44)
AST: 18 U/L (ref 15–41)
Albumin: 3.4 g/dL — ABNORMAL LOW (ref 3.5–5.0)
Alkaline Phosphatase: 104 U/L (ref 38–126)
Anion gap: 11 (ref 5–15)
BUN: 47 mg/dL — ABNORMAL HIGH (ref 8–23)
CO2: 22 mmol/L (ref 22–32)
Calcium: 8.4 mg/dL — ABNORMAL LOW (ref 8.9–10.3)
Chloride: 102 mmol/L (ref 98–111)
Creatinine, Ser: 2.45 mg/dL — ABNORMAL HIGH (ref 0.61–1.24)
GFR, Estimated: 28 mL/min — ABNORMAL LOW (ref >60)
Glucose, Bld: 122 mg/dL — ABNORMAL HIGH (ref 70–99)
Potassium: 3.2 mmol/L — ABNORMAL LOW (ref 3.5–5.1)
Sodium: 135 mmol/L (ref 135–145)
Total Bilirubin: 1.6 mg/dL — ABNORMAL HIGH (ref 0.3–1.2)
Total Protein: 7.8 g/dL (ref 6.5–8.1)

## 2022-08-08 LAB — BLOOD GAS, VENOUS
Acid-Base Excess: 3.7 mmol/L — ABNORMAL HIGH (ref 0.0–2.0)
Bicarbonate: 25.9 mmol/L (ref 20.0–28.0)
Drawn by: 1517
O2 Saturation: 98.5 %
Patient temperature: 37.2
pCO2, Ven: 31 mmHg — ABNORMAL LOW (ref 44–60)
pH, Ven: 7.53 — ABNORMAL HIGH (ref 7.25–7.43)
pO2, Ven: 88 mmHg — ABNORMAL HIGH (ref 32–45)

## 2022-08-08 LAB — TROPONIN I (HIGH SENSITIVITY)
Troponin I (High Sensitivity): 37 ng/L — ABNORMAL HIGH (ref <18)
Troponin I (High Sensitivity): 31 ng/L — ABNORMAL HIGH (ref <18)

## 2022-08-08 LAB — BASIC METABOLIC PANEL
Anion gap: 9 (ref 5–15)
BUN: 49 mg/dL — ABNORMAL HIGH (ref 8–23)
CO2: 23 mmol/L (ref 22–32)
Calcium: 8 mg/dL — ABNORMAL LOW (ref 8.9–10.3)
Chloride: 103 mmol/L (ref 98–111)
Creatinine, Ser: 2.52 mg/dL — ABNORMAL HIGH (ref 0.61–1.24)
GFR, Estimated: 28 mL/min — ABNORMAL LOW (ref >60)
Glucose, Bld: 183 mg/dL — ABNORMAL HIGH (ref 70–99)
Potassium: 3.5 mmol/L (ref 3.5–5.1)
Sodium: 135 mmol/L (ref 135–145)

## 2022-08-08 LAB — BLOOD GAS, ARTERIAL
Acid-Base Excess: 5 mmol/L — ABNORMAL HIGH (ref 0.0–2.0)
Bicarbonate: 27.1 mmol/L (ref 20.0–28.0)
Drawn by: 22179
O2 Saturation: 97.3 %
Patient temperature: 36.8
pCO2 arterial: 31 mmHg — ABNORMAL LOW (ref 32–48)
pH, Arterial: 7.55 — ABNORMAL HIGH (ref 7.35–7.45)
pO2, Arterial: 78 mmHg — ABNORMAL LOW (ref 83–108)

## 2022-08-08 LAB — RESP PANEL BY RT-PCR (RSV, FLU A&B, COVID)  RVPGX2
Influenza A by PCR: NEGATIVE
Influenza B by PCR: NEGATIVE
Resp Syncytial Virus by PCR: NEGATIVE
SARS Coronavirus 2 by RT PCR: NEGATIVE

## 2022-08-08 LAB — PHOSPHORUS: Phosphorus: 4.2 mg/dL (ref 2.5–4.6)

## 2022-08-08 LAB — MAGNESIUM
Magnesium: 2.6 mg/dL — ABNORMAL HIGH (ref 1.7–2.4)
Magnesium: 2.5 mg/dL — ABNORMAL HIGH (ref 1.7–2.4)

## 2022-08-08 LAB — TSH: TSH: 2.436 u[IU]/mL (ref 0.350–4.500)

## 2022-08-08 LAB — GLUCOSE, CAPILLARY: Glucose-Capillary: 178 mg/dL — ABNORMAL HIGH (ref 70–99)

## 2022-08-08 LAB — BRAIN NATRIURETIC PEPTIDE: B Natriuretic Peptide: >4500 pg/mL — ABNORMAL HIGH (ref 0.0–100.0)

## 2022-08-08 MED ORDER — LEVOTHYROXINE SODIUM 25 MCG PO TABS
137.0000 ug | ORAL_TABLET | Freq: Every morning | ORAL | Status: DC
  Administered 2022-08-09 – 2022-08-19 (×11): 137 ug via ORAL
  Filled 2022-08-08 (×12): qty 1

## 2022-08-08 MED ORDER — ACETAMINOPHEN 650 MG RE SUPP: 650.0000 mg | Freq: Four times a day (QID) | RECTAL | Status: DC | PRN

## 2022-08-08 MED ORDER — METOLAZONE 5 MG PO TABS
2.5000 mg | ORAL_TABLET | Freq: Once | ORAL | Status: AC
  Administered 2022-08-08: 2.5 mg via ORAL
  Filled 2022-08-08: qty 1

## 2022-08-08 MED ORDER — MIDODRINE HCL 5 MG PO TABS
5.0000 mg | ORAL_TABLET | Freq: Three times a day (TID) | ORAL | Status: DC
  Administered 2022-08-09 – 2022-08-10 (×6): 5 mg via ORAL
  Filled 2022-08-08 (×6): qty 1

## 2022-08-08 MED ORDER — FUROSEMIDE 10 MG/ML IJ SOLN
40.0000 mg | Freq: Once | INTRAMUSCULAR | Status: AC
  Administered 2022-08-08: 40 mg via INTRAVENOUS
  Filled 2022-08-08: qty 4

## 2022-08-08 MED ORDER — POTASSIUM CHLORIDE 10 MEQ/100ML IV SOLN
10.0000 meq | INTRAVENOUS | Status: AC
  Administered 2022-08-08 (×2): 10 meq via INTRAVENOUS
  Filled 2022-08-08 (×2): qty 100

## 2022-08-08 MED ORDER — SACUBITRIL-VALSARTAN 24-26 MG PO TABS
1.0000 | ORAL_TABLET | Freq: Two times a day (BID) | ORAL | Status: DC
  Administered 2022-08-08: 1 via ORAL
  Filled 2022-08-08: qty 1

## 2022-08-08 MED ORDER — SODIUM CHLORIDE 0.9% FLUSH
3.0000 mL | Freq: Two times a day (BID) | INTRAVENOUS | Status: DC
  Administered 2022-08-08 – 2022-08-16 (×14): 3 mL via INTRAVENOUS

## 2022-08-08 MED ORDER — FUROSEMIDE 10 MG/ML IJ SOLN
40.0000 mg | Freq: Two times a day (BID) | INTRAMUSCULAR | Status: DC
  Administered 2022-08-09 – 2022-08-10 (×2): 40 mg via INTRAVENOUS
  Filled 2022-08-08 (×3): qty 4

## 2022-08-08 MED ORDER — INSULIN ASPART 100 UNIT/ML IJ SOLN
0.0000 [IU] | Freq: Every day | INTRAMUSCULAR | Status: DC
  Administered 2022-08-09: 3 [IU] via SUBCUTANEOUS
  Administered 2022-08-15 – 2022-08-18 (×2): 2 [IU] via SUBCUTANEOUS

## 2022-08-08 MED ORDER — INSULIN ASPART 100 UNIT/ML IJ SOLN
0.0000 [IU] | Freq: Three times a day (TID) | INTRAMUSCULAR | Status: DC
  Administered 2022-08-09: 1 [IU] via SUBCUTANEOUS
  Administered 2022-08-09: 2 [IU] via SUBCUTANEOUS
  Administered 2022-08-09 – 2022-08-10 (×2): 1 [IU] via SUBCUTANEOUS
  Administered 2022-08-10: 3 [IU] via SUBCUTANEOUS
  Administered 2022-08-10 – 2022-08-11 (×3): 2 [IU] via SUBCUTANEOUS
  Administered 2022-08-11: 1 [IU] via SUBCUTANEOUS
  Administered 2022-08-12: 3 [IU] via SUBCUTANEOUS
  Administered 2022-08-12 – 2022-08-14 (×4): 2 [IU] via SUBCUTANEOUS
  Administered 2022-08-14 (×2): 5 [IU] via SUBCUTANEOUS
  Administered 2022-08-15: 1 [IU] via SUBCUTANEOUS
  Administered 2022-08-15 – 2022-08-17 (×3): 3 [IU] via SUBCUTANEOUS
  Administered 2022-08-18 – 2022-08-19 (×4): 2 [IU] via SUBCUTANEOUS

## 2022-08-08 MED ORDER — APIXABAN 5 MG PO TABS
5.0000 mg | ORAL_TABLET | Freq: Two times a day (BID) | ORAL | Status: DC
  Administered 2022-08-08 – 2022-08-10 (×4): 5 mg via ORAL
  Filled 2022-08-08 (×5): qty 1

## 2022-08-08 MED ORDER — SODIUM CHLORIDE 0.9 % IV SOLN: 250.0000 mL | INTRAVENOUS | Status: DC | PRN

## 2022-08-08 MED ORDER — ROSUVASTATIN CALCIUM 20 MG PO TABS
20.0000 mg | ORAL_TABLET | Freq: Every day | ORAL | Status: DC
  Administered 2022-08-09 – 2022-08-19 (×11): 20 mg via ORAL
  Filled 2022-08-08 (×11): qty 1

## 2022-08-08 MED ORDER — TAMSULOSIN HCL 0.4 MG PO CAPS
0.4000 mg | ORAL_CAPSULE | Freq: Every day | ORAL | Status: DC
  Administered 2022-08-09 – 2022-08-19 (×11): 0.4 mg via ORAL
  Filled 2022-08-08 (×11): qty 1

## 2022-08-08 MED ORDER — ACETAMINOPHEN 325 MG PO TABS: 650.0000 mg | ORAL_TABLET | Freq: Four times a day (QID) | ORAL | Status: DC | PRN

## 2022-08-08 MED ORDER — PANTOPRAZOLE SODIUM 40 MG PO TBEC
40.0000 mg | DELAYED_RELEASE_TABLET | Freq: Every day | ORAL | Status: DC
  Administered 2022-08-09 – 2022-08-19 (×11): 40 mg via ORAL
  Filled 2022-08-08 (×11): qty 1

## 2022-08-08 MED ORDER — POTASSIUM CHLORIDE CRYS ER 20 MEQ PO TBCR
20.0000 meq | EXTENDED_RELEASE_TABLET | Freq: Every day | ORAL | Status: DC
  Administered 2022-08-09 – 2022-08-10 (×2): 20 meq via ORAL
  Filled 2022-08-08 (×2): qty 1

## 2022-08-08 MED ORDER — POTASSIUM CHLORIDE CRYS ER 20 MEQ PO TBCR
40.0000 meq | EXTENDED_RELEASE_TABLET | Freq: Once | ORAL | Status: AC
  Administered 2022-08-08: 40 meq via ORAL
  Filled 2022-08-08: qty 2

## 2022-08-08 MED ORDER — NOREPINEPHRINE 4 MG/250ML-% IV SOLN
2.0000 ug/min | INTRAVENOUS | Status: DC
  Filled 2022-08-08: qty 250

## 2022-08-08 MED ORDER — SODIUM CHLORIDE 0.9% FLUSH: 3.0000 mL | INTRAVENOUS | Status: DC | PRN

## 2022-08-08 MED ORDER — FUROSEMIDE 10 MG/ML IJ SOLN
60.0000 mg | Freq: Once | INTRAMUSCULAR | Status: AC
  Administered 2022-08-08: 60 mg via INTRAVENOUS
  Filled 2022-08-08: qty 6

## 2022-08-08 MED ORDER — NICOTINE 14 MG/24HR TD PT24
14.0000 mg | MEDICATED_PATCH | Freq: Every day | TRANSDERMAL | Status: DC
  Administered 2022-08-08 – 2022-08-19 (×12): 14 mg via TRANSDERMAL
  Filled 2022-08-08 (×12): qty 1

## 2022-08-08 MED ORDER — ONDANSETRON HCL 4 MG PO TABS: 4.0000 mg | ORAL_TABLET | Freq: Four times a day (QID) | ORAL | Status: DC | PRN

## 2022-08-08 MED ORDER — CHLORHEXIDINE GLUCONATE CLOTH 2 % EX PADS
6.0000 | MEDICATED_PAD | Freq: Every day | CUTANEOUS | Status: DC
  Administered 2022-08-09 – 2022-08-19 (×11): 6 via TOPICAL

## 2022-08-08 MED ORDER — ONDANSETRON HCL 4 MG/2ML IJ SOLN
4.0000 mg | Freq: Four times a day (QID) | INTRAMUSCULAR | Status: DC | PRN
  Administered 2022-08-14: 4 mg via INTRAVENOUS
  Filled 2022-08-08: qty 2

## 2022-08-08 MED ORDER — AMIODARONE HCL 200 MG PO TABS: 100.0000 mg | ORAL_TABLET | Freq: Two times a day (BID) | ORAL | Status: DC

## 2022-08-08 MED ORDER — AMIODARONE HCL 200 MG PO TABS: 100.0000 mg | ORAL_TABLET | Freq: Every day | ORAL | Status: DC

## 2022-08-08 MED ORDER — ASPIRIN 81 MG PO TBEC
81.0000 mg | DELAYED_RELEASE_TABLET | Freq: Every day | ORAL | Status: DC
  Administered 2022-08-09 – 2022-08-19 (×11): 81 mg via ORAL
  Filled 2022-08-08 (×11): qty 1

## 2022-08-08 MED ORDER — MAGNESIUM OXIDE -MG SUPPLEMENT 400 (240 MG) MG PO TABS
400.0000 mg | ORAL_TABLET | Freq: Every day | ORAL | Status: DC
  Administered 2022-08-09 – 2022-08-19 (×11): 400 mg via ORAL
  Filled 2022-08-08 (×11): qty 1

## 2022-08-08 MED ORDER — OXYCODONE HCL 5 MG PO TABS
5.0000 mg | ORAL_TABLET | Freq: Four times a day (QID) | ORAL | Status: DC | PRN
  Administered 2022-08-09 – 2022-08-19 (×15): 5 mg via ORAL
  Filled 2022-08-08 (×16): qty 1

## 2022-08-08 NOTE — Assessment & Plan Note (Signed)
-  Cessation counseling provided -Nicotine patch has been ordered. 

## 2022-08-08 NOTE — Assessment & Plan Note (Signed)
Continue statin 

## 2022-08-08 NOTE — Plan of Care (Signed)
  Problem: Education: Goal: Ability to verbalize understanding of medication therapies will improve Outcome: Progressing   Problem: Cardiac: Goal: Ability to achieve and maintain adequate cardiopulmonary perfusion will improve Outcome: Progressing   Problem: Education: Goal: Knowledge of General Education information will improve Description: Including pain rating scale, medication(s)/side effects and non-pharmacologic comfort measures Outcome: Progressing   Problem: Health Behavior/Discharge Planning: Goal: Ability to manage health-related needs will improve Outcome: Progressing

## 2022-08-08 NOTE — Assessment & Plan Note (Deleted)
-  Recent echocardiogram in January 2024 demonstrating EF less than 20% -Elevated BNP, short winded sensation with activity and positive orthopnea -Patient also expressed increased lower extremity swelling -Chest x-ray with vascular congestion and bilateral pleural effusion; BNP > 4500 -continue daily weights, low sodium diet and strict I's and O's -will continue IV lasix; BNP has trended down. -therapy limited by soft BP and renal failure -holding farxiga and entresto currently. -following cardiology service recommendations Central line has been placed in morning to monitor CVP and given elevated lactic acid with poor Coox panel milrinone has been started.  Advanced heart failure service has been consulted to receive further guidance and determine necessity for patient to be transferred to [facility]. -Continue to follow clinical response -long term prognosis is poor.

## 2022-08-08 NOTE — Assessment & Plan Note (Signed)
-  Patient with acute kidney injury on chronic renal failure; creatinine is close to baseline, but higher; GFR at baseline. -continue close monitoring  -Continue to follow electrolytes trend and replete as needed. -continue to minimize nephrotoxic agents, avoid hypotension and the use of contrast.

## 2022-08-08 NOTE — H&P (Signed)
History and Physical    Patient: [name] [name]:[id number] DOB: 08-26-1955 DOA: 08/08/2022 DOS: the patient was seen and examined on 08/08/2022 PCP: Rica Records, FNP  Patient coming from: Home  Chief Complaint:  Chief Complaint  Patient presents with   Shortness of Breath   HPI: [name] [name] is a 67 y.o. male with medical history significant of type 2 diabetes with nephropathy, hypertension, hyperlipidemia, paroxysmal atrial fibrillation on chronic Eliquis, chronic systolic heart failure and chronic kidney disease stage IIIa; who presented to the emergency department secondary to increased shortness of breath and lower extremity swelling.  Patient reports missing couple doses of his Lasix and over the last week prior to admission has noticed increased shortness of breath with exertion and also orthopnea.  Patient expressed dry coughing spells and has noticed increase in his abdominal girth.  No chest pain, no nausea, no vomiting, no dysuria, no hematuria, no focal weaknesses, no sick contacts or any other complaints. Workup in the ED with a chest x-ray demonstrating vascular congestion and chronic bilateral pleural effusion suggesting congestive heart failure; elevated BNP (>4500) and positive crackles on physical exam.  COVID PCR negative.  IV Lasix has been provided and [facility] contacted to place patient in the hospital for further evaluation and management of acute on chronic heart failure exacerbation.   Review of Systems: As mentioned in the history of present illness. All other systems reviewed and are negative.  Past Medical History:  Diagnosis Date   Anxiety    Apical mural thrombus    Ascending aorta dilatation (HCC)    Chronic HFrEF (heart failure with reduced ejection fraction) (HCC)    a. EF 15% in 2013 with cath showing normal cors b. EF 30-35% by repeat echo in 2015   CKD stage 3b, GFR 30-44 ml/min (HCC)    Cocaine use    Diabetes mellitus    History of  noncompliance with medical treatment, presenting hazards to health    Hyperlipidemia    Hypertension    Mitral regurgitation    NICM (nonischemic cardiomyopathy) (HCC)    PAD (peripheral artery disease) (HCC)    PAF (paroxysmal atrial fibrillation) (HCC)    Past Surgical History:  Procedure Laterality Date   COLONOSCOPY  01/04/2012   Procedure: COLONOSCOPY;  Surgeon: West Bali, MD;  Location: [facility];  Service: Endoscopy;  Laterality: N/A;  1:30 PM   ENDARTERECTOMY POPLITEAL Right 02/08/2022   Procedure: TIBIAL PERONEAL ENDARTERECTOMY;  Surgeon: Maeola Harman, MD;  Location: [facility] OR;  Service: Vascular;  Laterality: Right;   HERNIA REPAIR     LEFT AND RIGHT HEART CATHETERIZATION WITH CORONARY ANGIOGRAM N/A 07/09/2011   Procedure: LEFT AND RIGHT HEART CATHETERIZATION WITH CORONARY ANGIOGRAM;  Surgeon: Peter M Swaziland, MD;  Location: [facility] CATH LAB;  Service: Cardiovascular;  Laterality: N/A;   RIGHT/LEFT HEART CATH AND CORONARY ANGIOGRAPHY N/A 02/25/2020   Procedure: RIGHT/LEFT HEART CATH AND CORONARY ANGIOGRAPHY;  Surgeon: Laurey Morale, MD;  Location: [facility] INVASIVE CV LAB;  Service: Cardiovascular;  Laterality: N/A;   THROMBECTOMY FEMORAL ARTERY Right 02/08/2022   Procedure: RIGHT LOWER THROMBECTOMY WITH VEIN PATCH;  Surgeon: Maeola Harman, MD;  Location: [facility] OR;  Service: Vascular;  Laterality: Right;   VEIN HARVEST Right 02/08/2022   Procedure: RIGHT LOWER SAPHENOUS VEIN HARVEST;  Surgeon: Maeola Harman, MD;  Location: [facility] OR;  Service: Vascular;  Laterality: Right;   Social History:  reports that he has been smoking cigarettes and e-cigarettes.  He started smoking about 39 years ago. He has a 7.2 pack-year smoking history. He has never been exposed to tobacco smoke. He has never used smokeless tobacco. He reports current alcohol use. He reports that he does not use drugs.  Allergies  Allergen Reactions   Bee Pollen Swelling    Family History   Problem Relation Age of Onset   Heart attack Father    Heart failure Father    Cancer Mother        Multiple myeloma   Heart disease Sister     Prior to Admission medications   Medication Sig Start Date End Date Taking? Authorizing Provider  amiodarone (PACERONE) 200 MG tablet TAKE 1/2 TABLET EVERY DAY 06/02/22  Yes Laurey Morale, MD  aspirin EC 81 MG tablet Take 1 tablet (81 mg total) by mouth daily at 6 (six) AM. Swallow whole. 02/11/22  Yes Vonna Drafts, MD  dapagliflozin propanediol (FARXIGA) 10 MG TABS tablet Take 1 tablet (10 mg total) by mouth daily. 02/11/22  Yes McDiarmid, Leighton Roach, MD  ELIQUIS 5 MG TABS tablet TAKE ONE TABLET BY MOUTH 2 TIMES A DAY 07/26/22  Yes Branch, Dorothe Pea, MD  furosemide (LASIX) 40 MG tablet Take 1 tablet (40 mg total) by mouth 2 (two) times daily. 04/25/22 04/25/23 Yes Shah, Pratik D, DO  levothyroxine (SYNTHROID) 137 MCG tablet Take 137 mcg by mouth every morning. 11/09/20  Yes [provider]  MAGNESIUM OXIDE PO Take 1 tablet by mouth daily.   Yes [provider]  polyethylene glycol powder (GLYCOLAX/MIRALAX) 17 GM/SCOOP powder Take 17 g by mouth daily. Patient taking differently: Take 17 g by mouth daily as needed (constipation). 02/10/22  Yes Erick Alley, DO  potassium chloride SA (KLOR-CON M) 20 MEQ tablet Take 1 tablet (20 mEq total) by mouth daily. 04/24/22 09/25/22 Yes Shah, Pratik D, DO  sacubitril-valsartan (ENTRESTO) 24-26 MG TAKE (1) TABLET BY MOUTH TWICE DAILY. 07/19/22  Yes Antoine Poche, MD    Physical Exam: Vitals:   08/08/22 1632 08/08/22 1636 08/08/22 1707 08/08/22 1756  BP: (!) 89/72 94/78 94/81  90/68  Pulse:  (!) 116 (!) 115 (!) 113  Resp:  (!) 28    Temp:  98.2 F (36.8 C)    TempSrc:      SpO2: (!) 79% 100% 100% 91%  Weight:      Height:       General exam: Alert, awake, oriented x 3; demonstrating mild tachypnea and expressing short winded sensation and orthopnea.  No chest pain. Respiratory system: Pulm fine  crackles at the bases, no using accessory muscles; good saturation on room air while awake; if patient fully sleep saturation drop to low 80s and he ended up requiring 2 L nasal cannula supplementation to keep it above mid 90s. Cardiovascular system: Sinus tachycardia appreciated, no rubs, no gallops, mild JVD appreciated on exam. Gastrointestinal system: Abdomen is nondistended, soft and nontender.  Increased abdominal girth appreciated; positive bowel sounds. Central nervous system: Alert and oriented. No focal neurological deficits. Extremities: No cyanosis or clubbing; 1+ edema appreciated bilaterally. Skin: No petechiae. Psychiatry: Judgement and insight appear normal. Mood & affect appropriate.   Data Reviewed: Respiratory panel: Negative for COVID, influenza and RSV -CBC: White blood cells 8.4, hemoglobin 12.4 and platelet count 264 K Comprehensive metabolic panel:Sodium 135, potassium 3.2, chloride 102, bicarb 22, BUN 47, creatinine 2.4, normal LFTs and GFR 32 Magnesium: 2.6  Assessment and Plan: * Acute on chronic systolic HF (heart failure) (HCC) -  Recent echocardiogram in January 2024 demonstrating EF less than 20% -Elevated BNP, short winded sensation with activity and positive orthopnea -Patient also expressed increased lower extremity swelling -Chest x-ray with vascular congestion and bilateral pleural effusion -Follow daily weights, low-sodium diet, strict I's and O's and Reds clip measurement -Metolazone x 1 dose will be provided; continue Entresto and use IV Lasix. -Holding Farxiga in the setting of acute exacerbation. -Patient is not a candidate for spironolactone secondary to chronic renal failure. -Cardiology service has been consulted and will follow further recommendations regarding GDMT.  Hypothyroidism -Update TSH -Continue Synthroid.  History of DVT (deep vein thrombosis) -Continue chronic anticoagulation with the use of Eliquis -Patient reports no redness,  pain or uneven swelling on his legs.  Tobacco use disorder -Cessation counseling provided. -Nicotine patch has been ordered.  PAF (paroxysmal atrial fibrillation) (HCC) -Continue the use of amiodarone and Eliquis for secondary prevention -Telemetry monitoring -Follow electrolytes and replete as needed; plan is for magnesium above 2 and potassium above 4 as much as possible.  Chronic renal failure, stage 3a (HCC) -Close to baseline -Continue close monitoring of patient renal function with acute diuresis -Advised to maintain adequate oral hydration -Follow strict I's and O's -Minimize nephrotoxic agents, avoid hypotension and the use of contrast.  Hyperlipidemia -Continue statin -Heart healthy diet discussed with patient.  Type 2 diabetes with nephropathy (HCC) -Follow CBGs fluctuation -Sliding scale insulin has been ordered. -Modified carbohydrate diet discussed with patient. -Patient with nephropathy and hyperlipidemia associated with diabetes.  Hypokalemia -Continue to follow electrolytes and further replete as needed -Telemetry monitoring in place -Magnesium level 2.5.    Advance Care Planning:   Code Status: Full Code   Consults: Cardiology service  Family Communication: No family at bedside  Severity of Illness: The appropriate patient status for this patient is INPATIENT. Inpatient status is judged to be reasonable and necessary in order to provide the required intensity of service to ensure the patient's safety. The patient's presenting symptoms, physical exam findings, and initial radiographic and laboratory data in the context of their chronic comorbidities is felt to place them at high risk for further clinical deterioration. Furthermore, it is not anticipated that the patient will be medically stable for discharge from the hospital within 2 midnights of admission.   * I certify that at the point of admission it is my clinical judgment that the patient will require  inpatient hospital care spanning beyond 2 midnights from the point of admission due to high intensity of service, high risk for further deterioration and high frequency of surveillance required.*  Author: Vassie Loll, MD 08/08/2022 6:21 PM  For on call review [email or web address].

## 2022-08-08 NOTE — ED Triage Notes (Signed)
SOB x1 week Hx of CHF Non productive cough Denies fevers 1/3 PPD smoker

## 2022-08-08 NOTE — Progress Notes (Signed)
eLink Physician-Brief Progress Note Patient Name: [name] [name] DOB: [birth year] MRN: [id number]   Date of Service  08/08/2022  HPI/Events of Note  67 year old male with a history of type 2 diabetes, nephropathy, A-fib on Eliquis, and heart failure who presented with dyspnea and lower extremity swelling found to be in heart failure exacerbation.   On presentation, he is tachypneic, tachycardic, and saturating 100% on 2 L of oxygen.  Metabolic panel consistent with reduced renal function, hyperglycemia, and blood counts consistent with anemia.  Comfortable appearing on exam without evidence of respiratory distress.  Persistent tachycardia.  Chest radiograph with increased vascular markings with fluid overload.  eICU Interventions  Maintain scheduled Lasix.  Goal-directed medical therapy per cardiology. DICTATE-AHF suggests likely acceptable to maintain Farxiga in the setting of acute heart failure exacerbation.  Could consider nighttime BiPAP therapy.  GI prophylaxis with chronic PPI DVT prophylaxis with chronic Eliquis.     Intervention Category Evaluation Type: New Patient Evaluation  [name] 08/08/2022, 10:10 PM

## 2022-08-08 NOTE — Progress Notes (Signed)
   08/08/22 1632  Vitals  BP (!) 89/72  MAP (mmHg) 79  MEWS COLOR  MEWS Score Color Yellow  Oxygen Therapy  SpO2 (!) 79 %  O2 Device Nasal Cannula  O2 Flow Rate (L/min) 1 L/min  MEWS Score  MEWS Temp 0  MEWS Systolic 1  MEWS Pulse 0  MEWS RR 2  MEWS LOC 0  MEWS Score 3   MD notified

## 2022-08-08 NOTE — Assessment & Plan Note (Signed)
TSH WNL  Continue Synthroid

## 2022-08-08 NOTE — Assessment & Plan Note (Addendum)
Episodic NSVT.  Transitioned to po amiodarone.  Anticoagulation with apixaban.

## 2022-08-08 NOTE — Assessment & Plan Note (Addendum)
Anticoagulation with apixaban.  Neuropathy symptoms have improved with gabapentin.

## 2022-08-08 NOTE — Assessment & Plan Note (Addendum)
Uncontrolled T2DM with hyperglycemia.   Continue insulin sliding scale for glucose cover and monitoring.  On gabapentin for neuropathy. Continue with statin therapy.

## 2022-08-08 NOTE — Progress Notes (Signed)
   08/08/22 1636  Vitals  Temp 98.2 F (36.8 C)  BP 94/78  MAP (mmHg) 84  Pulse Rate (!) 116  Resp (!) 28  MEWS COLOR  MEWS Score Color Red  Oxygen Therapy  SpO2 100 %  O2 Device Nasal Cannula  O2 Flow Rate (L/min) 2 L/min  MEWS Score  MEWS Temp 0  MEWS Systolic 1  MEWS Pulse 2  MEWS RR 2  MEWS LOC 0  MEWS Score 5  Provider Notification  Provider Name/Title Vassie Loll MD  Date Provider Notified 08/08/22  Time Provider Notified 1639  Method of Notification  (Chat\)  Notification Reason Other (Comment);Critical Result;Change in status (RED MEWs)  Provider response At bedside  Date of Provider Response 08/08/22  Time of Provider Response 1640   MD bedside

## 2022-08-08 NOTE — Progress Notes (Signed)
   08/08/22 1415  Vitals  Temp 97.7 F (36.5 C)  Temp Source Oral  BP 107/82  MAP (mmHg) 91  Pulse Rate (!) 116  Pulse Rate Source Dinamap  Resp (!) 28  Level of Consciousness  Level of Consciousness Alert  MEWS COLOR  MEWS Score Color Red  Oxygen Therapy  SpO2 100 %  O2 Device Nasal Cannula  O2 Flow Rate (L/min) 1 L/min  Pain Assessment  Pain Scale 0-10  Pain Score 0  Height and Weight  Weight 82.2 kg  BMI (Calculated) 24.57  MEWS Score  MEWS Temp 0  MEWS Systolic 0  MEWS Pulse 2  MEWS RR 2  MEWS LOC 0  MEWS Score 4  Provider Notification  Provider Name/Title Vassie Loll MD  Date Provider Notified 08/08/22  Time Provider Notified 1415  Method of Notification  (Chat)  Notification Reason Other (Comment) (RED MEWS)  Provider response See new orders  Date of Provider Response 08/08/22  Time of Provider Response 1425   Charge and MD notified that pt is RED MEWS. Pt is not in distress at this time.

## 2022-08-08 NOTE — ED Provider Notes (Signed)
[place] EMERGENCY DEPARTMENT AT [facility] Provider Note   CSN: [id number] Arrival date & time: 08/08/22  1137     History  Chief Complaint  Patient presents with   Shortness of Breath    [name] [name] is a 67 y.o. male.   Shortness of Breath   68 year old male presents emergency department complaints of shortness of breath.  Patient reports shortness of breath over the past several days with acute worsening over the past 2 days.  Reports history of heart failure and feels as if he is having exacerbation.  Reports compliance with his Lasix up until yesterday where he accidentally missed his dose.  Reports cough over the same duration of time nonproductive in nature.  Denies any fever, chest pain, abdominal pain, nausea, vomiting, urinary symptoms, change in bowel habits.  Reports breathing worsening with laying flat and is also noted some swelling in his legs that is worsened.  Past medical history significant for CHF with left ventricular ejection fraction less than 20% on 1/24, CKD 3B, hyperlipidemia, hypertension, mitral regurgitation, nonischemic cardiomyopathy, PAD, paroxysmal atrial fibrillation on Eliquis, DVT  Home Medications Prior to Admission medications   Medication Sig Start Date End Date Taking? Authorizing Provider  albuterol (VENTOLIN HFA) 108 (90 Base) MCG/ACT inhaler Inhale 2 puffs into the lungs every 6 (six) hours as needed for wheezing or shortness of breath. 03/01/22   Del Newman Nip, Tenna Child, FNP  amiodarone (PACERONE) 200 MG tablet TAKE 1/2 TABLET EVERY DAY 06/02/22   Laurey Morale, MD  aspirin EC 81 MG tablet Take 1 tablet (81 mg total) by mouth daily at 6 (six) AM. Swallow whole. 02/11/22   Vonna Drafts, MD  dapagliflozin propanediol (FARXIGA) 10 MG TABS tablet Take 1 tablet (10 mg total) by mouth daily. 02/11/22   McDiarmid, Leighton Roach, MD  ELIQUIS 5 MG TABS tablet TAKE ONE TABLET BY MOUTH 2 TIMES A DAY 07/26/22   Antoine Poche, MD   furosemide (LASIX) 40 MG tablet Take 1 tablet (40 mg total) by mouth 2 (two) times daily. 04/25/22 04/25/23  Sherryll Burger, Pratik D, DO  levothyroxine (SYNTHROID) 137 MCG tablet Take 137 mcg by mouth every morning. 11/09/20   [provider]  Magnesium 250 MG TABS Take 250 mg by mouth daily.    [provider]  oxyCODONE (OXY IR/ROXICODONE) 5 MG immediate release tablet Take 1 tablet (5 mg total) by mouth every 4 (four) hours as needed for moderate pain, severe pain or breakthrough pain. 04/24/22   Sherryll Burger, Pratik D, DO  pantoprazole (PROTONIX) 40 MG tablet Take 1 tablet (40 mg total) by mouth daily. 02/11/22   Erick Alley, DO  polyethylene glycol powder (GLYCOLAX/MIRALAX) 17 GM/SCOOP powder Take 17 g by mouth daily. 02/10/22   Erick Alley, DO  potassium chloride SA (KLOR-CON M) 20 MEQ tablet Take 1 tablet (20 mEq total) by mouth daily. 04/24/22 05/24/22  Sherryll Burger, Pratik D, DO  rosuvastatin (CRESTOR) 20 MG tablet Take 1 tablet (20 mg total) by mouth daily. 02/11/22   Erick Alley, DO  sacubitril-valsartan (ENTRESTO) 24-26 MG TAKE (1) TABLET BY MOUTH TWICE DAILY. 07/19/22   Antoine Poche, MD  senna-docusate (SENOKOT-S) 8.6-50 MG tablet Take 1 tablet by mouth at bedtime as needed for mild constipation. 02/10/22   Erick Alley, DO  tamsulosin (FLOMAX) 0.4 MG CAPS capsule Take 1 capsule (0.4 mg total) by mouth daily. 04/30/22   Del Nigel Berthold, FNP      Allergies  Bee pollen    Review of Systems   Review of Systems  Respiratory:  Positive for shortness of breath.   All other systems reviewed and are negative.   Physical Exam Updated Vital Signs BP 117/85   Pulse 83   Temp 98.6 F (37 C)   Resp (!) 30   Ht 6' (1.829 m)   Wt 81.6 kg   SpO2 95%   BMI 24.41 kg/m  Physical Exam Vitals and nursing note reviewed.  Constitutional:      General: He is not in acute distress.    Appearance: He is well-developed.  HENT:     Head: Normocephalic and atraumatic.  Eyes:      Conjunctiva/sclera: Conjunctivae normal.  Cardiovascular:     Rate and Rhythm: Normal rate and regular rhythm.     Heart sounds: No murmur heard. Pulmonary:     Effort: Pulmonary effort is normal. Tachypnea present. No respiratory distress.     Breath sounds: Normal breath sounds.     Comments: Rales auscultated bilateral lower lung fields. Abdominal:     Palpations: Abdomen is soft.     Tenderness: There is no abdominal tenderness.  Musculoskeletal:        General: No swelling.     Cervical back: Neck supple.     Right lower leg: Edema present.     Left lower leg: Edema present.  Skin:    General: Skin is warm and dry.     Capillary Refill: Capillary refill takes less than 2 seconds.  Neurological:     Mental Status: He is alert.  Psychiatric:        Mood and Affect: Mood normal.     ED Results / Procedures / Treatments   Labs (all labs ordered are listed, but only abnormal results are displayed) Labs Reviewed  CBC WITH DIFFERENTIAL/PLATELET - Abnormal; Notable for the following components:      Result Value   RBC 3.80 (*)    Hemoglobin 12.4 (*)    HCT 38.0 (*)    RDW 15.9 (*)    All other components within normal limits  COMPREHENSIVE METABOLIC PANEL - Abnormal; Notable for the following components:   Potassium 3.2 (*)    Glucose, Bld 122 (*)    BUN 47 (*)    Creatinine, Ser 2.45 (*)    Calcium 8.4 (*)    Albumin 3.4 (*)    Total Bilirubin 1.6 (*)    GFR, Estimated 28 (*)    All other components within normal limits  MAGNESIUM - Abnormal; Notable for the following components:   Magnesium 2.6 (*)    All other components within normal limits  BRAIN NATRIURETIC PEPTIDE - Abnormal; Notable for the following components:   B Natriuretic Peptide >4,500.0 (*)    All other components within normal limits  TROPONIN I (HIGH SENSITIVITY) - Abnormal; Notable for the following components:   Troponin I (High Sensitivity) 37 (*)    All other components within normal limits   RESP PANEL BY RT-PCR (RSV, FLU A&B, COVID)  RVPGX2  MAGNESIUM  PHOSPHORUS  TSH  I-STAT CHEM 8, ED  TROPONIN I (HIGH SENSITIVITY)    EKG EKG Interpretation Date/Time:  Sunday August 08 2022 11:48:34 EDT Ventricular Rate:  118 PR Interval:  148 QRS Duration:  138 QT Interval:  482 QTC Calculation: 675 R Axis:   267  Text Interpretation: Sinus tachycardia Right bundle branch block Inferior infarct , age undetermined Anterolateral infarct , age undetermined Abnormal  ECG Confirmed by Gloris Manchester [phone]) on 08/08/2022 1:20:05 PM  Radiology DG Chest Port 1 View  Result Date: 08/08/2022 CLINICAL DATA:  Shortness of breath with nonproductive cough. History of congestive heart failure. EXAM: PORTABLE CHEST 1 VIEW COMPARISON:  Radiographs 04/19/2022 and 02/08/2022.  CT 03/12/2020. FINDINGS: 1213 hours. Stable cardiomegaly, vascular congestion and right greater than left pleural effusions. Bibasilar airspace opacities appear unchanged. There is no pneumothorax. The bones appear unchanged. Multiple telemetry leads overlie the chest. IMPRESSION: Unchanged appearance of the chest with cardiomegaly, vascular congestion and bilateral pleural effusions likely due to chronic congestive heart failure. Electronically Signed   By: Carey Bullocks M.D.   On: 08/08/2022 12:27    Procedures Procedures    Medications Ordered in ED Medications  potassium chloride 10 mEq in 100 mL IVPB ( Intravenous Infusion Verify 08/08/22 1326)  apixaban (ELIQUIS) tablet 5 mg (has no administration in time range)  furosemide (LASIX) injection 40 mg (has no administration in time range)  metolazone (ZAROXOLYN) tablet 2.5 mg (has no administration in time range)  sodium chloride flush (NS) 0.9 % injection 3 mL (has no administration in time range)  sodium chloride flush (NS) 0.9 % injection 3 mL (has no administration in time range)  0.9 %  sodium chloride infusion (has no administration in time range)  acetaminophen (TYLENOL)  tablet 650 mg (has no administration in time range)    Or  acetaminophen (TYLENOL) suppository 650 mg (has no administration in time range)  ondansetron (ZOFRAN) tablet 4 mg (has no administration in time range)    Or  ondansetron (ZOFRAN) injection 4 mg (has no administration in time range)  amiodarone (PACERONE) tablet 100 mg (has no administration in time range)  aspirin EC tablet 81 mg (has no administration in time range)  levothyroxine (SYNTHROID) tablet 137 mcg (has no administration in time range)  Magnesium TABS 250 mg (has no administration in time range)  oxyCODONE (Oxy IR/ROXICODONE) immediate release tablet 5 mg (has no administration in time range)  pantoprazole (PROTONIX) EC tablet 40 mg (has no administration in time range)  potassium chloride SA (KLOR-CON M) CR tablet 20 mEq (has no administration in time range)  rosuvastatin (CRESTOR) tablet 20 mg (has no administration in time range)  sacubitril-valsartan (ENTRESTO) 24-26 mg per tablet (has no administration in time range)  tamsulosin (FLOMAX) capsule 0.4 mg (has no administration in time range)  potassium chloride SA (KLOR-CON M) CR tablet 40 mEq (40 mEq Oral Given 08/08/22 1305)  furosemide (LASIX) injection 40 mg (40 mg Intravenous Given 08/08/22 1305)    ED Course/ Medical Decision Making/ A&P Clinical Course as of 08/08/22 1332  Sun Aug 08, 2022  1329 Hospitalist consulted Dr. Gwenlyn Perking who agreed with admission and send further treatment/care. [CR]    Clinical Course User Index [CR] Peter Garter, PA                             Medical Decision Making Amount and/or Complexity of Data Reviewed Labs: ordered. Radiology: ordered.  Risk Prescription drug management. Decision regarding hospitalization.   This patient presents to the ED for concern of shortness of breath, this involves an extensive number of treatment options, and is a complaint that carries with it a high risk of complications and morbidity.   The differential diagnosis includes The causes for shortness of breath include but are not limited to Cardiac (AHF, pericardial effusion and tamponade, arrhythmias, ischemia, etc) Respiratory (  COPD, asthma, pneumonia, pneumothorax, primary pulmonary hypertension, PE/VQ mismatch) Hematological (anemia)  Co morbidities that complicate the patient evaluation  See HPI   Additional history obtained:  Additional history obtained from EMR External records from outside source obtained and reviewed including hospital records   Lab Tests:  I Ordered, and personally interpreted labs.  The pertinent results include: No leukocytosis.  Mild evidence anemia with a hemoglobin of 12.4 of which is near patient's baseline.  Hypokalemia 3.2 as well as hypocalcemia of 8.4 otherwise, electrolytes within normal limits.  No transaminitis.  Patient with baseline renal dysfunction with creatinine 2.45, BUN of 47, GFR 28.  BNP elevated greater than 4500, initial troponin 37.  Respiratory viral panel negative.   Imaging Studies ordered:  I ordered imaging studies including chest x-ray I independently visualized and interpreted imaging which showed cardiomegaly with pulmonary vascular congestion bilateral pleural effusion I agree with the radiologist interpretation   Cardiac Monitoring: / EKG:  The patient was maintained on a cardiac monitor.  I personally viewed and interpreted the cardiac monitored which showed an underlying rhythm of: Sinus tachycardia, right bundle branch block, inferior infarct.  Anterolateral infarct. Sinus tachycardia Right bundle branch block Inferior infarct , age undetermined Anterolateral infarct , age undetermined Abnormal ECG  Consultations Obtained:  See ED course  Problem List / ED Course / Critical interventions / Medication management  CHF exacerbation I ordered medication including potassium chloride, Lasix  Reevaluation of the patient after these medicines showed  that the patient improved I have reviewed the patients home medicines and have made adjustments as needed   Social Determinants of Health:  Some cigarette use.  Denies illicit drug use.   Test / Admission - Considered:  CHF exacerbation Vitals signs significant for tachypnea with respiratory rate greater than 20s. Otherwise within normal range and stable throughout visit. Laboratory/imaging studies significant for: See above 67 year old male presents emergency department with shortness of breath over the past week with acute worsening over the past 2 days.  Patient found with evidence of acute on chronic heart failure as most likely cause of patient's symptoms given elevation of BNP greater than 4500, pulmonary vascular congestion/pleural effusion on chest x-ray and systemically observable for volume overload.  Low suspicion for ACS given only mild elevated troponin without obvious acute ischemic changes on EKG.  Elevation troponin most likely secondary to demand ischemia.  Doubt pneumonia, anemia, COPD/asthma.  Doubt PE given the patient is anticoagulated on Eliquis, without significant tachycardia, hypoxia, pleuritic chest pain/chest pain.  Given patient's poor heart function on prior echo within LVEF of less than 20% as well as failure of outpatient medications, admission to the hospital team most appropriate for IV diuresis.   Treatment plan were discussed at length with patient and they knowledge understanding was agreeable to said plan.  Appropriate consultations were made as described in the ED course.  Patient was stable upon admission to the hospital.         Final Clinical Impression(s) / ED Diagnoses Final diagnoses:  Acute on chronic congestive heart failure, unspecified heart failure type [facility])    Rx / DC Orders ED Discharge Orders     None         Peter Garter, [place] 08/08/22 1333    Gloris Manchester, MD 08/09/22 [phone]

## 2022-08-08 NOTE — Progress Notes (Signed)
   08/08/22 1756  Vitals  BP 90/68  MAP (mmHg) 77  Pulse Rate (!) 113  MEWS COLOR  MEWS Score Color Red  Oxygen Therapy  SpO2 91 %  O2 Device Nasal Cannula  O2 Flow Rate (L/min) 2 L/min  MEWS Score  MEWS Temp 0  MEWS Systolic 1  MEWS Pulse 2  MEWS RR 2  MEWS LOC 0  MEWS Score 5   Pt remains a red MEWS due to HR and respirations MD aware

## 2022-08-09 ENCOUNTER — Inpatient Hospital Stay (HOSPITAL_COMMUNITY): Payer: Medicare HMO

## 2022-08-09 DIAGNOSIS — I5023 Acute on chronic systolic (congestive) heart failure: Secondary | ICD-10-CM

## 2022-08-09 DIAGNOSIS — N1831 Chronic kidney disease, stage 3a: Secondary | ICD-10-CM | POA: Diagnosis not present

## 2022-08-09 DIAGNOSIS — E1121 Type 2 diabetes mellitus with diabetic nephropathy: Secondary | ICD-10-CM | POA: Diagnosis not present

## 2022-08-09 DIAGNOSIS — F17200 Nicotine dependence, unspecified, uncomplicated: Secondary | ICD-10-CM | POA: Diagnosis not present

## 2022-08-09 LAB — ECHOCARDIOGRAM COMPLETE
Area-P 1/2: 4.49 cm2
Est EF: <20
Height: 72 in
S' Lateral: 5.7 cm
Weight: 2899.49 oz

## 2022-08-09 LAB — BASIC METABOLIC PANEL
Anion gap: 11 (ref 5–15)
BUN: 49 mg/dL — ABNORMAL HIGH (ref 8–23)
CO2: 23 mmol/L (ref 22–32)
Calcium: 8.4 mg/dL — ABNORMAL LOW (ref 8.9–10.3)
Chloride: 101 mmol/L (ref 98–111)
Creatinine, Ser: 2.36 mg/dL — ABNORMAL HIGH (ref 0.61–1.24)
GFR, Estimated: 30 mL/min — ABNORMAL LOW (ref >60)
Glucose, Bld: 132 mg/dL — ABNORMAL HIGH (ref 70–99)
Potassium: 3.3 mmol/L — ABNORMAL LOW (ref 3.5–5.1)
Sodium: 135 mmol/L (ref 135–145)

## 2022-08-09 LAB — LACTIC ACID, PLASMA: Lactic Acid, Venous: 1.9 mmol/L (ref 0.5–1.9)

## 2022-08-09 LAB — GLUCOSE, CAPILLARY
Glucose-Capillary: 121 mg/dL — ABNORMAL HIGH (ref 70–99)
Glucose-Capillary: 163 mg/dL — ABNORMAL HIGH (ref 70–99)
Glucose-Capillary: 149 mg/dL — ABNORMAL HIGH (ref 70–99)
Glucose-Capillary: 252 mg/dL — ABNORMAL HIGH (ref 70–99)

## 2022-08-09 LAB — HEMOGLOBIN A1C
Hgb A1c MFr Bld: 6.6 % — ABNORMAL HIGH (ref 4.8–5.6)
Mean Plasma Glucose: 143 mg/dL

## 2022-08-09 LAB — MRSA NEXT GEN BY PCR, NASAL: MRSA by PCR Next Gen: NOT DETECTED

## 2022-08-09 MED ORDER — AMIODARONE LOAD VIA INFUSION
150.0000 mg | Freq: Once | INTRAVENOUS | Status: AC
  Administered 2022-08-09: 150 mg via INTRAVENOUS
  Filled 2022-08-09: qty 83.34

## 2022-08-09 MED ORDER — AMIODARONE HCL IN DEXTROSE 360-4.14 MG/200ML-% IV SOLN
30.0000 mg/h | INTRAVENOUS | Status: DC
  Administered 2022-08-09 – 2022-08-13 (×6): 30 mg/h via INTRAVENOUS
  Filled 2022-08-09 (×10): qty 200

## 2022-08-09 MED ORDER — POTASSIUM CHLORIDE CRYS ER 20 MEQ PO TBCR
20.0000 meq | EXTENDED_RELEASE_TABLET | Freq: Once | ORAL | Status: AC
  Administered 2022-08-09: 20 meq via ORAL
  Filled 2022-08-09: qty 1

## 2022-08-09 MED ORDER — ORAL CARE MOUTH RINSE: 15.0000 mL | OROMUCOSAL | Status: DC | PRN

## 2022-08-09 MED ORDER — PERFLUTREN LIPID MICROSPHERE
1.0000 mL | INTRAVENOUS | Status: AC | PRN
  Administered 2022-08-09: 2 mL via INTRAVENOUS

## 2022-08-09 MED ORDER — AMIODARONE HCL IN DEXTROSE 360-4.14 MG/200ML-% IV SOLN
60.0000 mg/h | INTRAVENOUS | Status: AC
  Administered 2022-08-09 (×2): 60 mg/h via INTRAVENOUS
  Filled 2022-08-09 (×2): qty 200

## 2022-08-09 NOTE — Progress Notes (Signed)
   08/09/22 0703  ReDS Vest / Clip  Station Marker C  Ruler Value 32  ReDS Value Range < 36  ReDS Actual Value 23

## 2022-08-09 NOTE — Plan of Care (Signed)

## 2022-08-09 NOTE — TOC Initial Note (Signed)
Transition of Care [facility]) - Initial/Assessment Note    Patient Details  Name: [name] [name] MRN: [id number] Date of Birth: 1956/01/13  [facility]) CM/SW Contact:    Karn Cassis, LCSW Phone Number: 08/09/2022, 8:26 AM  Clinical Narrative: Pt admitted for acute on chronic systolic heart failure. Assessment completed due to high risk readmission score and consult for CHF screening. Pt reports he lives with his wife and step-son. He is independent with ADLs and occasionally uses a walker. He drives himself to appointments. Pt plans to return home when medically stable.  CHF screening completed. Pt indicates he was diagnosed 10+ years ago. He states he tries to weigh himself daily. He "pretty much" follows a heart healthy diet. Pt said he is on many medications and does his best with taking them as prescribed. Discussed home health RN. Pt declines. Will add CHF education to AVS.                   Expected Discharge Plan: Home/Self Care Barriers to Discharge: Continued Medical Work up   Patient Goals and CMS Choice Patient states their goals for this hospitalization and ongoing recovery are:: return home   Choice offered to / list presented to : Patient [place] ownership interest in [facility].provided to::  (n/a)    Expected Discharge Plan and Services In-house Referral: Clinical Social Work     Living arrangements for the past 2 months: Single Family Home                           HH Arranged: Patient Refused HH          Prior Living Arrangements/Services Living arrangements for the past 2 months: Single Family Home Lives with:: Spouse, Adult Children Patient language and need for interpreter reviewed:: Yes Do you feel safe going back to the place where you live?: Yes      Need for Family Participation in Patient Care: No (Comment)   Current home services: DME (walker) Criminal Activity/Legal Involvement Pertinent to Current  Situation/Hospitalization: No - Comment as needed  Activities of Daily Living      Permission Sought/Granted                  Emotional Assessment     Affect (typically observed): Appropriate Orientation: : Oriented to Self, Oriented to Place, Oriented to  Time, Oriented to Situation Alcohol / Substance Use: Not Applicable Psych Involvement: No (comment)  Admission diagnosis:  Acute on chronic systolic HF (heart failure) (HCC) [I50.23] Acute on chronic congestive heart failure, unspecified heart failure type (HCC) [I50.9] Patient Active Problem List   Diagnosis Date Noted   Acute on chronic systolic HF (heart failure) (HCC) 08/08/2022   Urinary incontinence, functional 04/30/2022   PAD (peripheral artery disease) (HCC) 04/21/2022   Nonischemic cardiomyopathy (HCC) 04/21/2022   Tobacco use disorder 04/19/2022   History of DVT (deep vein thrombosis) 04/19/2022   Hypothyroidism 04/19/2022   Cervicalgia 03/01/2022   Dorsalgia 03/01/2022   Paresthesia of skin 03/01/2022   Other cervical disc degeneration, unspecified cervical region 03/01/2022   Other intervertebral disc degeneration, lumbar region 03/01/2022   Spondylosis of lumbar region without myelopathy or radiculopathy 03/01/2022   Other spondylosis with radiculopathy, cervical region 03/01/2022   Encounter for routine adult physical exam with abnormal findings 03/01/2022   Macrocytic anemia 02/09/2022   Ischemic leg 02/08/2022   Elevated troponin 02/08/2022   CHF (congestive heart  failure) (HCC) 02/08/2022   PAF (paroxysmal atrial fibrillation) (HCC) 02/08/2022   Acute HFrEF (heart failure with reduced ejection fraction) (HCC) 10/17/2021   Hypokalemia 10/17/2021   LV (left ventricular) mural thrombus 04/30/2019   Acute respiratory failure with hypoxia (HCC) 04/25/2019   Acute on chronic combined systolic and diastolic CHF (congestive heart failure) (HCC) 04/23/2019   Chronic pain 04/23/2019   Chronic renal  failure, stage 3a (HCC) 04/23/2019   Generalized anxiety disorder 08/14/2014   Diabetes mellitus type 2, uncontrolled 08/14/2014   Hyperlipidemia 08/14/2014   Onychomycosis 08/14/2014   Hypertrophic toenail 08/14/2014   Tinea versicolor 08/14/2014   Hepatomegaly 08/14/2014   Pain in joint, upper arm 03/06/2014   Body mass index (BMI) of 28.0-28.9 in adult 02/15/2014   Cardiomyopathy, dilated, nonischemic (HCC) 10/12/2013   Chronic systolic heart failure (HCC) 07/08/2011   Type 2 diabetes with nephropathy (HCC) 07/08/2011   Hypertension 07/08/2011   PCP:  Rica Records, FNP Pharmacy:   [facility] - [place], [place] - [address]  [place] [address] Phone: [phone] Fax: [phone]  [facility] - [place], [place] - [address] [place] [address] Phone: [phone] Fax: [phone]  [facility] [place], [place] - [address] [place] [address] Phone: [phone] Fax: [phone]     Social Determinants of Health (SDOH) Social History: SDOH Screenings   Food Insecurity: No Food Insecurity (04/20/2022)  Housing: Low Risk  (04/20/2022)  Transportation Needs: No Transportation Needs (04/20/2022)  Utilities: Not At Risk (04/20/2022)  Alcohol Screen: Low Risk  (04/07/2022)  Depression (PHQ2-9): High Risk (04/30/2022)  Financial Resource Strain: Medium Risk (04/07/2022)  Physical Activity: Inactive (04/07/2022)  Social Connections: Moderately Isolated (04/07/2022)  Stress: No Stress Concern Present (04/07/2022)  Tobacco Use: High Risk (08/08/2022)   SDOH Interventions:     Readmission Risk Interventions    08/09/2022    8:24 AM 04/20/2022   11:07 AM 02/10/2022    2:42 PM  Readmission Risk Prevention Plan  Transportation Screening Complete Complete Complete  PCP or Specialist Appt within 3-5 Days   --  HRI or Home Care Consult  Complete Complete   Social Work Consult for Recovery Care Planning/Counseling  Complete Complete  Palliative Care Screening  Not Applicable Not Applicable  Medication Review Oceanographer) Complete Complete Complete  HRI or Home Care Consult Complete    SW Recovery Care/Counseling Consult Complete    Palliative Care Screening Not Applicable    Skilled Nursing Facility Not Applicable

## 2022-08-09 NOTE — Progress Notes (Signed)
Patient doing well without BIPAP at this time. 96% on 2.5 lpm nasal cannula with a respiratory rate of 19. Patient reports no distress or trouble breathing and is resting comfortably asking for an [place] ice.

## 2022-08-09 NOTE — Progress Notes (Signed)
  Echocardiogram 2D Echocardiogram has been performed.  Janalyn Harder 08/09/2022, 11:28 AM

## 2022-08-09 NOTE — Progress Notes (Signed)
Progress Note   Patient: [name] [name] DOB: 02-13-1955 DOA: 08/08/2022     1 DOS: the patient was seen and examined on 08/09/2022   Brief hospital admission narrative: [name] [name] is a 67 y.o. male with medical history significant of type 2 diabetes with nephropathy, hypertension, hyperlipidemia, paroxysmal atrial fibrillation on chronic Eliquis, chronic systolic heart failure and chronic kidney disease stage IIIa; who presented to the emergency department secondary to increased shortness of breath and lower extremity swelling.  Patient reports missing couple doses of his Lasix and over the last week prior to admission has noticed increased shortness of breath with exertion and also orthopnea.  Patient expressed dry coughing spells and has noticed increase in his abdominal girth.  No chest pain, no nausea, no vomiting, no dysuria, no hematuria, no focal weaknesses, no sick contacts or any other complaints. Workup in the ED with a chest x-ray demonstrating vascular congestion and chronic bilateral pleural effusion suggesting congestive heart failure; elevated BNP (>4500) and positive crackles on physical exam.  COVID PCR negative.   IV Lasix has been provided and [facility] contacted to place patient in the hospital for further evaluation and management of acute on chronic heart failure exacerbation.  Assessment and Plan: * Acute on chronic systolic HF (heart failure) (HCC) -Recent echocardiogram in January 2024 demonstrating EF less than 20% -Elevated BNP, short winded sensation with activity and positive orthopnea -Patient also expressed increased lower extremity swelling -Chest x-ray with vascular congestion and bilateral pleural effusion; BNP > 4500 -continue daily weights, low sodium diet and strict I's and O's -will continue IV lasix -therapy limited by soft BP and renal failure -holding farxiga and entresto currently -follow cardiology service regarding need for repeat echo  and further GDMT. -follow clinical response -long term prognosis is poor.  Hypothyroidism -TSH WNL. -Continue Synthroid.  History of DVT (deep vein thrombosis) -Continue chronic anticoagulation with the use of Eliquis -Patient reports no redness, pain or uneven swelling on his legs.  Tobacco use disorder -Cessation counseling provided. -Nicotine patch has been ordered.  PAF (paroxysmal atrial fibrillation) (HCC) -Continue the use of amiodarone, but given component of RVR will do amiodarone drip instead of oral meds. -continue Eliquis for secondary prevention -continue Telemetry monitoring -Follow electrolytes and replete as needed; plan is for magnesium above 2 and potassium above 4 as much as possible. -will follow cardiology service rec's.    Chronic renal failure, stage 3a (HCC) -Close to baseline and improving with diuresis -continue close monitoring  -follow electrolytes trend and replete as needed. -continue to minimize nephrotoxic agents, avoid hypotension and the use of contrast.  Hyperlipidemia -Continue statin -Heart healthy diet discussed with patient.  Type 2 diabetes with nephropathy (HCC) -continue to Follow CBGs fluctuation -continue Sliding scale insulin  -modified carbohydrates diet encouraged..   Subjective:  Afebrile, no CP, no nausea or vomiting; expressing orthopnea and short winded sensation.  Physical Exam: Vitals:   08/09/22 0600 08/09/22 0730 08/09/22 0800 08/09/22 0801  BP: 105/86  102/75   Pulse: (!) 119  (!) 117   Resp: (!) 33   16  Temp:  (!) 96.6 F (35.9 C)    TempSrc:  Axillary    SpO2: 96%  92%   Weight:      Height:       General exam: Alert, awake, oriented x 3, in not major distress. Transfer to the unit overnight with concerns for future requirement of amiodarone drip and/or pressors. Respiratory system: fine crackles, positive  tachypnea, no wheezing. Cardiovascular system:irregular, no rubs, no gallops. Gastrointestinal  system: Abdomen is nondistended, soft and nontender. No organomegaly or masses felt. Normal bowel sounds heard. Central nervous system: Alert and oriented. No focal neurological deficits. Extremities: No cyanosis, no clubbing.  Skin: No petechiae. Psychiatry: Judgement and insight appear normal. Mood & affect appropriate.   Data Reviewed: Basic metabolic panel: Sodium 135, potassium 3.3, chloride 101, bicarb 23, BUN 49, creatinine 2.36 and GFR 30 A1c: Pending Lactic acid:1.9  Family Communication: no family at Bedside.  Disposition: Status is: Inpatient Remains inpatient appropriate because: continue IV diuresis and rate controlling agents.    Planned Discharge Destination: Home   Time spent: 50 minutes  Author: Vassie Loll, MD 08/09/2022 8:48 AM  For on call review [email or web address].

## 2022-08-09 NOTE — Consult Note (Addendum)
Cardiology Consultation   Patient ID: MYCAH FORMICA MRN: [id number]; DOB: 09-Mar-1955  Admit date: 08/08/2022 Date of Consult: 08/09/2022  PCP:  Rica Records, FNP    [facility]Care Providers Cardiologist:  Dina Rich, MD  Advanced Heart Failure:  Marca Ancona, MD       Patient Profile:   [name] [name] is a 67 y.o. male with a hx of NICM who is being seen 08/09/2022 for the evaluation of Acute CHF at the request of Dr. Gwenlyn Perking.  History of Present Illness:   Mr. Guarisco with history of chronic HFrEF/NICM (dx with EF 15% in 2013, no CAD by cath 2013, minor RCA irregularities in 02/2020, last EF 12% in 01/2022), apical thrombus (dx by echocardiogram 04/2019), PAD (s/p RLE thromboembolectomy and right tibioperoneal trunk endarterectomy in 01/2022), poor compliance, paroxysmal atrial fibrillation (dx 04/2019), atypical RBBB, HTN, HLD, Type 2 DM, Stage 3b CKD, mild MR, mild dilation of ascending aorta, prior cocaine use (UDS 2008).  The patient was hospitalized 01/2022 for right lower extremity ischemia due to occlusion of the right SFA and popliteal artery with ultimate right lower extremity thrombectomy and right tibial peroneal trunk endarterectomy by vascular. Cardiology followed due to echo with EF less than 20% with large mobile apical thrombus, also with mild MR and mild dilation of ascending aorta.  He did report noncompliance with his medications prior to admission and had not been taking Eliquis or Entresto. Entresto and spirononlactone were held given hypotension and variable renal function.  He was readmitted 4/1-04/24/22 with hypoxemic respiratory failure secondary to acute on chronic combined CHF and seen by our team. He required Lasix drip. Metoprolol was discontinued due to severe LV dysfunction. He was started on Entresto at discharge.  Patient presents with recurrent CHF after missing several lasix doses-says he was taking all his other meds but didn't  have money for lasix. BNP >4500. He complains mostly of fatigue, lack of energy. Can't walk across a room without having to rest.   Past Medical History:  Diagnosis Date   Anxiety    Apical mural thrombus    Ascending aorta dilatation (HCC)    Chronic HFrEF (heart failure with reduced ejection fraction) (HCC)    a. EF 15% in 2013 with cath showing normal cors b. EF 30-35% by repeat echo in 2015   CKD stage 3b, GFR 30-44 ml/min (HCC)    Cocaine use    Diabetes mellitus    History of noncompliance with medical treatment, presenting hazards to health    Hyperlipidemia    Hypertension    Mitral regurgitation    NICM (nonischemic cardiomyopathy) (HCC)    PAD (peripheral artery disease) (HCC)    PAF (paroxysmal atrial fibrillation) (HCC)     Past Surgical History:  Procedure Laterality Date   COLONOSCOPY  01/04/2012   Procedure: COLONOSCOPY;  Surgeon: West Bali, MD;  Location: [facility];  Service: Endoscopy;  Laterality: N/A;  1:30 PM   ENDARTERECTOMY POPLITEAL Right 02/08/2022   Procedure: TIBIAL PERONEAL ENDARTERECTOMY;  Surgeon: Maeola Harman, MD;  Location: [facility] OR;  Service: Vascular;  Laterality: Right;   HERNIA REPAIR     LEFT AND RIGHT HEART CATHETERIZATION WITH CORONARY ANGIOGRAM N/A 07/09/2011   Procedure: LEFT AND RIGHT HEART CATHETERIZATION WITH CORONARY ANGIOGRAM;  Surgeon: Peter M Swaziland, MD;  Location: [facility] CATH LAB;  Service: Cardiovascular;  Laterality: N/A;   RIGHT/LEFT HEART CATH AND CORONARY ANGIOGRAPHY N/A 02/25/2020   Procedure: RIGHT/LEFT HEART CATH AND  CORONARY ANGIOGRAPHY;  Surgeon: Laurey Morale, MD;  Location: [facility] INVASIVE CV LAB;  Service: Cardiovascular;  Laterality: N/A;   THROMBECTOMY FEMORAL ARTERY Right 02/08/2022   Procedure: RIGHT LOWER THROMBECTOMY WITH VEIN PATCH;  Surgeon: Maeola Harman, MD;  Location: [facility] OR;  Service: Vascular;  Laterality: Right;   VEIN HARVEST Right 02/08/2022   Procedure: RIGHT LOWER SAPHENOUS VEIN  HARVEST;  Surgeon: Maeola Harman, MD;  Location: [facility] OR;  Service: Vascular;  Laterality: Right;     Home Medications:  Prior to Admission medications   Medication Sig Start Date End Date Taking? Authorizing Provider  amiodarone (PACERONE) 200 MG tablet TAKE 1/2 TABLET EVERY DAY 06/02/22  Yes Laurey Morale, MD  aspirin EC 81 MG tablet Take 1 tablet (81 mg total) by mouth daily at 6 (six) AM. Swallow whole. 02/11/22  Yes Vonna Drafts, MD  dapagliflozin propanediol (FARXIGA) 10 MG TABS tablet Take 1 tablet (10 mg total) by mouth daily. 02/11/22  Yes McDiarmid, Leighton Roach, MD  ELIQUIS 5 MG TABS tablet TAKE ONE TABLET BY MOUTH 2 TIMES A DAY 07/26/22  Yes Jermall Isaacson, Dorothe Pea, MD  furosemide (LASIX) 40 MG tablet Take 1 tablet (40 mg total) by mouth 2 (two) times daily. 04/25/22 04/25/23 Yes Shah, Pratik D, DO  levothyroxine (SYNTHROID) 137 MCG tablet Take 137 mcg by mouth every morning. 11/09/20  Yes [provider]  MAGNESIUM OXIDE PO Take 1 tablet by mouth daily.   Yes [provider]  polyethylene glycol powder (GLYCOLAX/MIRALAX) 17 GM/SCOOP powder Take 17 g by mouth daily. Patient taking differently: Take 17 g by mouth daily as needed (constipation). 02/10/22  Yes Erick Alley, DO  potassium chloride SA (KLOR-CON M) 20 MEQ tablet Take 1 tablet (20 mEq total) by mouth daily. 04/24/22 09/25/22 Yes Shah, Pratik D, DO  sacubitril-valsartan (ENTRESTO) 24-26 MG TAKE (1) TABLET BY MOUTH TWICE DAILY. 07/19/22  Yes BranchDorothe Pea, MD    Inpatient Medications: Scheduled Meds:  amiodarone  100 mg Oral BID   apixaban  5 mg Oral BID   aspirin EC  81 mg Oral Q0600   Chlorhexidine Gluconate Cloth  6 each Topical Q0600   furosemide  40 mg Intravenous Q12H   insulin aspart  0-5 Units Subcutaneous QHS   insulin aspart  0-9 Units Subcutaneous TID WC   levothyroxine  137 mcg Oral q morning   magnesium oxide  400 mg Oral Daily   midodrine  5 mg Oral TID WC   nicotine  14 mg Transdermal Daily    pantoprazole  40 mg Oral Daily   potassium chloride SA  20 mEq Oral Daily   rosuvastatin  20 mg Oral Daily   sacubitril-valsartan  1 tablet Oral BID   sodium chloride flush  3 mL Intravenous Q12H   tamsulosin  0.4 mg Oral Daily   Continuous Infusions:  sodium chloride     norepinephrine (LEVOPHED) Adult infusion Stopped (08/08/22 2158)   PRN Meds: sodium chloride, acetaminophen **OR** acetaminophen, ondansetron **OR** ondansetron (ZOFRAN) IV, mouth rinse, oxyCODONE, sodium chloride flush  Allergies:    Allergies  Allergen Reactions   Bee Pollen Swelling    Social History:   Social History   Socioeconomic History   Marital status: Married    Spouse name: Not on file   Number of children: 0   Years of education: Not on file   Highest education level: Not on file  Occupational History   Occupation: disabled  Tobacco Use  Smoking status: Some Days    Current packs/day: 0.00    Average packs/day: 0.3 packs/day for 28.9 years (7.2 ttl pk-yrs)    Types: Cigarettes, E-cigarettes    Start date: 10/13/1982    Last attempt to quit: 09/19/2011    Years since quitting: 10.8    Passive exposure: Never   Smokeless tobacco: Never   Tobacco comments:    electronic cig for 4 months   Vaping Use   Vaping status: Some Days   Substances: Flavoring  Substance and Sexual Activity   Alcohol use: Yes    Comment: 1-2 beers per week   Drug use: No   Sexual activity: Not on file  Other Topics Concern   Not on file  Social History Narrative   Patient is right handed.   Patient seldom drinks caffeine.   Social Determinants of Health   Financial Resource Strain: Medium Risk (04/07/2022)   Overall Financial Resource Strain (CARDIA)    Difficulty of Paying Living Expenses: Somewhat hard  Food Insecurity: No Food Insecurity (04/20/2022)   Hunger Vital Sign    Worried About Running Out of Food in the Last Year: Never true    Ran Out of Food in the Last Year: Never true  Transportation  Needs: No Transportation Needs (04/20/2022)   PRAPARE - Administrator, Civil Service (Medical): No    Lack of Transportation (Non-Medical): No  Physical Activity: Inactive (04/07/2022)   Exercise Vital Sign    Days of Exercise per Week: 0 days    Minutes of Exercise per Session: 0 min  Stress: No Stress Concern Present (04/07/2022)   [employer] of Occupational Health - Occupational Stress Questionnaire    Feeling of Stress : Not at all  Social Connections: Moderately Isolated (04/07/2022)   Social Connection and Isolation Panel [NHANES]    Frequency of Communication with Friends and Family: Twice a week    Frequency of Social Gatherings with Friends and Family: Never    Attends Religious Services: 1 to 4 times per year    Active Member of [employer] or Organizations: No    Attends Banker Meetings: Never    Marital Status: Married  Catering manager Violence: Not At Risk (04/20/2022)   Humiliation, Afraid, Rape, and Kick questionnaire    Fear of Current or Ex-Partner: No    Emotionally Abused: No    Physically Abused: No    Sexually Abused: No    Family History:     Family History  Problem Relation Age of Onset   Heart attack Father    Heart failure Father    Cancer Mother        Multiple myeloma   Heart disease Sister      ROS:  Please see the history of present illness.  Review of Systems  Constitutional: Positive for malaise/fatigue.  HENT: Negative.    Cardiovascular:  Positive for dyspnea on exertion and leg swelling.  Respiratory:  Positive for shortness of breath.   Endocrine: Negative.   Hematologic/Lymphatic: Negative.   Musculoskeletal: Negative.   Gastrointestinal: Negative.   Genitourinary: Negative.   Neurological:  Positive for weakness.    All other ROS reviewed and negative.     Physical Exam/Data:   Vitals:   08/09/22 0600 08/09/22 0730 08/09/22 0800 08/09/22 0801  BP: 105/86  102/75   Pulse: (!) 119  (!) 117   Resp: (!)  33   16  Temp:  (!) 96.6 F (35.9 C)  TempSrc:  Axillary    SpO2: 96%  92%   Weight:      Height:        Intake/Output Summary (Last 24 hours) at 08/09/2022 0827 Last data filed at 08/09/2022 0300 Gross per 24 hour  Intake 531.47 ml  Output 1750 ml  Net -1218.53 ml      08/09/2022    4:24 AM 08/08/2022   10:00 PM 08/08/2022    2:15 PM  Last 3 Weights  Weight (lbs) 181 lb 3.5 oz 180 lb 12.4 oz 181 lb 3.5 oz  Weight (kg) 82.2 kg 82 kg 82.2 kg     Body mass index is 24.58 kg/m.  General:  Well nourished, well developed, in no acute distress  HEENT: normal Neck: no JVD Vascular: No carotid bruits; Distal pulses 2+ bilaterally Cardiac:  normal S1, S2; irreg 118/m with 1/6 systolic murmur LSB Lungs: a few basilar crackles otherwise clear Abd: soft, nontender, no hepatomegaly  Ext: no edema Musculoskeletal:  No deformities, BUE and BLE strength normal and equal Skin: warm and dry  Neuro:  CNs 2-12 intact, no focal abnormalities noted Psych:  Normal affect   EKG:  The EKG was personally reviewed and demonstrates:  sinus tachycardia with RBBB unchanged  Telemetry:  Telemetry was personally reviewed and demonstrates:  sinus tachycardia/afib/PVC's  Relevant CV Studies: Echo 01/2022 IMPRESSIONS     1. Large, mobile apical thrombus measuring 1.9 cm x 0.96 cm. Left  ventricular ejection fraction, by estimation, is <20%. Left ventricular  ejection fraction by PLAX is 12 %. The left ventricle has severely  decreased function. The left ventricle  demonstrates global hypokinesis. There is mild concentric left ventricular  hypertrophy. Left ventricular diastolic parameters are consistent with  Grade III diastolic dysfunction (restrictive). Elevated left ventricular  end-diastolic pressure.   2. Right ventricular systolic function is mildly reduced. The right  ventricular size is normal. There is moderately elevated pulmonary artery  systolic pressure.   3. Left atrial size was  severely dilated.   4. Right atrial size was severely dilated.   5. The mitral valve is normal in structure. Mild mitral valve  regurgitation. No evidence of mitral stenosis.   6. The aortic valve is normal in structure. Aortic valve regurgitation is  not visualized. No aortic stenosis is present.   7. Aortic dilatation noted. There is mild dilatation of the ascending  aorta, measuring 42 mm.   8. The inferior vena cava is normal in size with greater than 50%  respiratory variability, suggesting right atrial pressure of 3 mmHg.   Laboratory Data:  High Sensitivity Troponin:   Recent Labs  Lab 08/08/22 1210 08/08/22 1332  TROPONINIHS 37* 31*     Chemistry Recent Labs  Lab 08/08/22 1210 08/08/22 1332 08/08/22 2004 08/09/22 0322  NA 135  --  135 135  K 3.2*  --  3.5 3.3*  CL 102  --  103 101  CO2 22  --  23 23  GLUCOSE 122*  --  183* 132*  BUN 47*  --  49* 49*  CREATININE 2.45*  --  2.52* 2.36*  CALCIUM 8.4*  --  8.0* 8.4*  MG 2.6* 2.5*  --   --   GFRNONAA 28*  --  28* 30*  ANIONGAP 11  --  9 11    Recent Labs  Lab 08/08/22 1210  PROT 7.8  ALBUMIN 3.4*  AST 18  ALT 14  ALKPHOS 104  BILITOT 1.6*   Lipids No  results for input(s): "CHOL", "TRIG", "HDL", "LABVLDL", "LDLCALC", "CHOLHDL" in the last 168 hours.  Hematology Recent Labs  Lab 08/08/22 1210  WBC 8.4  RBC 3.80*  HGB 12.4*  HCT 38.0*  MCV 100.0  MCH 32.6  MCHC 32.6  RDW 15.9*  PLT 264   Thyroid  Recent Labs  Lab 08/08/22 1332  TSH 2.436    BNP Recent Labs  Lab 08/08/22 1211  BNP >4,500.0*    DDimer No results for input(s): "DDIMER" in the last 168 hours.   Radiology/Studies:  DG Chest Port 1 View  Result Date: 08/08/2022 CLINICAL DATA:  Shortness of breath with nonproductive cough. History of congestive heart failure. EXAM: PORTABLE CHEST 1 VIEW COMPARISON:  Radiographs 04/19/2022 and 02/08/2022.  CT 03/12/2020. FINDINGS: 1213 hours. Stable cardiomegaly, vascular congestion and right  greater than left pleural effusions. Bibasilar airspace opacities appear unchanged. There is no pneumothorax. The bones appear unchanged. Multiple telemetry leads overlie the chest. IMPRESSION: Unchanged appearance of the chest with cardiomegaly, vascular congestion and bilateral pleural effusions likely due to chronic congestive heart failure. Electronically Signed   By: Carey Bullocks M.D.   On: 08/08/2022 12:27     Assessment and Plan:   Acute on chronic systolic CHF/NICM EF <20% echo 01/2022 -I/O -1218 received IV lasix 60 mg and 40 mg yest and Z 2.5 mg now on 40 mg IV BID -levophed never started -ReDS 23 today -tachycardia may be driving some of this. Main complaint is fatigue -BP limits treatment options -repeat echo  PAF on amiodarone 100 mg daily PTA now increased bid and eliquis-says he's taking meds ? IV amio to try to slow him down  LV thrombus 01/2022 on eliquis-not coumadin candidate due to compliance issues  PAD followed by [facility]  HLD  AKI/CKD  Crt 2.45  DM2-per primary team  Tobacco abuse-has cut back to 1 cigarette daily, more when watching sports   Risk Assessment/Risk Scores:        New York Heart Association (NYHA) Functional Class NYHA Class IV  CHA2DS2-VASc Score = 3   This indicates a 3.2% annual risk of stroke. The patient's score is based upon: CHF History: 1 HTN History: 0 Diabetes History: 1 Stroke History: 0 Vascular Disease History: 0 Age Score: 1 Gender Score: 0         For questions or updates, please contact [facility] Please consult [email or web address] for contact info under    Signed, Jacolyn Reedy, PA-C  08/09/2022 8:27 AM  Attending note  Patient seen and discussed with PA Geni Bers, I agree with her documentation. 67 yo male history of chronic HFrEF, NICM, LV thrombus on eleiquis, afib, CKD 3, NSVT, medication noncompliance, PAD with prior thromboembolectomy, presented with SOB, LE edema, and orthopnea. Reports medication  compliance.      WBC 8.4 Hgb 12.4 Plt 264 K 3.2 BUN 47 Cr 2.45 GFR 28 Mg 2.6 BNP >4500 Mg 2.5 TSH 2.4 Trop 37-->31 ABG: 7.55/31/78/27 CXR: vacular congestion, right>left pleural effusions EKG sinus tach, IVCD  Jan 2024 echo: LVEF <20%, grade III dd, mild RV dysfunction, severe BAE, + apical thrombus 02/2020 RHC/LHC: no significant CAD. CI 2.04, mean PA 14, PCWP 10 cMRI suggetion of possible sarcoid, subsequent PET was negative    1.Acute on chronic HFrEF -Jan 2024 echo: LVEF <20%, grade III dd, mild RV dysfunction, severe BAE, + apical thrombus - CXR pulm congestion, bilateral plerual effusion. BNP >4500 - received IV lasix yesterday 40mg  then 60mg  along with oral metolazone 2.5mg ,  scheduled for 40mg  bid today - he is negative 1.2 L, downtrend in Cr with diuresis consistent with venous congestion and HF. Continue IV lasix, despite reds vest continues to have JVD and significant crackles on exam.   - medical therapy limited by renal dysfunction, soft bp's. Would d/c his entersto for now, avoid MRAs. GFR is borderline for SGLT2i, hold for now in case procedures are indicated during admission. Has not been on beta blocker due to  low CI by prior RHC   2.History of apical thrombus - he is on eliquis   3. PAF - he is on amio, eliquis - has had afib with RVR this admission, hold his oral amio and start amio gtt.   4. PAD - ASA added to eliquis  it appears started during Jan 2024 admissionLE thromboembolectomy, defer ongoing ASA use to vascular. Needs eliquis long term for his PAF as well as apical thrombus   Dina Rich MD

## 2022-08-09 NOTE — Progress Notes (Signed)
Last few SBP's have been in the 80's with map >65. Patient resting comfortably in bed with no complaints. Dr Gwenlyn Perking made aware of blood pressures and ok with not running Levo at this time since maps are >65.

## 2022-08-09 NOTE — Plan of Care (Signed)
  Problem: Education: Goal: Ability to demonstrate management of disease process will improve Outcome: Progressing   Problem: Activity: Goal: Capacity to carry out activities will improve Outcome: Progressing   Problem: Clinical Measurements: Goal: Will remain free from infection Outcome: Progressing   Problem: Clinical Measurements: Goal: Diagnostic test results will improve Outcome: Progressing

## 2022-08-10 ENCOUNTER — Inpatient Hospital Stay (HOSPITAL_COMMUNITY): Payer: Medicare HMO

## 2022-08-10 DIAGNOSIS — I5023 Acute on chronic systolic (congestive) heart failure: Secondary | ICD-10-CM | POA: Diagnosis not present

## 2022-08-10 DIAGNOSIS — N1832 Chronic kidney disease, stage 3b: Secondary | ICD-10-CM

## 2022-08-10 DIAGNOSIS — I480 Paroxysmal atrial fibrillation: Secondary | ICD-10-CM | POA: Diagnosis not present

## 2022-08-10 DIAGNOSIS — R570 Cardiogenic shock: Secondary | ICD-10-CM | POA: Diagnosis not present

## 2022-08-10 DIAGNOSIS — N179 Acute kidney failure, unspecified: Secondary | ICD-10-CM | POA: Diagnosis not present

## 2022-08-10 DIAGNOSIS — N1831 Chronic kidney disease, stage 3a: Secondary | ICD-10-CM | POA: Diagnosis not present

## 2022-08-10 DIAGNOSIS — F17200 Nicotine dependence, unspecified, uncomplicated: Secondary | ICD-10-CM | POA: Diagnosis not present

## 2022-08-10 DIAGNOSIS — E1121 Type 2 diabetes mellitus with diabetic nephropathy: Secondary | ICD-10-CM | POA: Diagnosis not present

## 2022-08-10 LAB — MAGNESIUM: Magnesium: 2.4 mg/dL (ref 1.7–2.4)

## 2022-08-10 LAB — COOXEMETRY PANEL
Carboxyhemoglobin: 0.8 % (ref 0.5–1.5)
Carboxyhemoglobin: 0.5 % (ref 0.5–1.5)
Methemoglobin: <0.7 % (ref 0.0–1.5)
Methemoglobin: <0.7 % (ref 0.0–1.5)
O2 Saturation: 33.7 %
O2 Saturation: 86.6 %
Total hemoglobin: 12.1 g/dL (ref 12.0–16.0)
Total hemoglobin: 11.5 g/dL — ABNORMAL LOW (ref 12.0–16.0)

## 2022-08-10 LAB — BASIC METABOLIC PANEL
Anion gap: 14 (ref 5–15)
BUN: 52 mg/dL — ABNORMAL HIGH (ref 8–23)
CO2: 23 mmol/L (ref 22–32)
Calcium: 8.5 mg/dL — ABNORMAL LOW (ref 8.9–10.3)
Chloride: 97 mmol/L — ABNORMAL LOW (ref 98–111)
Creatinine, Ser: 2.38 mg/dL — ABNORMAL HIGH (ref 0.61–1.24)
GFR, Estimated: 30 mL/min — ABNORMAL LOW (ref >60)
Glucose, Bld: 170 mg/dL — ABNORMAL HIGH (ref 70–99)
Potassium: 3.3 mmol/L — ABNORMAL LOW (ref 3.5–5.1)
Sodium: 134 mmol/L — ABNORMAL LOW (ref 135–145)

## 2022-08-10 LAB — GLUCOSE, CAPILLARY
Glucose-Capillary: 193 mg/dL — ABNORMAL HIGH (ref 70–99)
Glucose-Capillary: 213 mg/dL — ABNORMAL HIGH (ref 70–99)
Glucose-Capillary: 96 mg/dL (ref 70–99)
Glucose-Capillary: 121 mg/dL — ABNORMAL HIGH (ref 70–99)
Glucose-Capillary: 174 mg/dL — ABNORMAL HIGH (ref 70–99)

## 2022-08-10 LAB — COMPREHENSIVE METABOLIC PANEL
ALT: 15 U/L (ref 0–44)
AST: 13 U/L — ABNORMAL LOW (ref 15–41)
Albumin: 2.7 g/dL — ABNORMAL LOW (ref 3.5–5.0)
Alkaline Phosphatase: 92 U/L (ref 38–126)
Anion gap: 9 (ref 5–15)
BUN: 48 mg/dL — ABNORMAL HIGH (ref 8–23)
CO2: 28 mmol/L (ref 22–32)
Calcium: 8.5 mg/dL — ABNORMAL LOW (ref 8.9–10.3)
Chloride: 100 mmol/L (ref 98–111)
Creatinine, Ser: 2.59 mg/dL — ABNORMAL HIGH (ref 0.61–1.24)
GFR, Estimated: 27 mL/min — ABNORMAL LOW (ref >60)
Glucose, Bld: 170 mg/dL — ABNORMAL HIGH (ref 70–99)
Potassium: 3.4 mmol/L — ABNORMAL LOW (ref 3.5–5.1)
Sodium: 137 mmol/L (ref 135–145)
Total Bilirubin: 0.2 mg/dL — ABNORMAL LOW (ref 0.3–1.2)
Total Protein: 6.3 g/dL — ABNORMAL LOW (ref 6.5–8.1)

## 2022-08-10 LAB — LACTIC ACID, PLASMA
Lactic Acid, Venous: 2.1 mmol/L (ref 0.5–1.9)
Lactic Acid, Venous: 1 mmol/L (ref 0.5–1.9)

## 2022-08-10 LAB — BRAIN NATRIURETIC PEPTIDE: B Natriuretic Peptide: 2475 pg/mL — ABNORMAL HIGH (ref 0.0–100.0)

## 2022-08-10 MED ORDER — POTASSIUM CHLORIDE CRYS ER 20 MEQ PO TBCR: 40.0000 meq | EXTENDED_RELEASE_TABLET | Freq: Once | ORAL | Status: DC

## 2022-08-10 MED ORDER — METOLAZONE 2.5 MG PO TABS
2.5000 mg | ORAL_TABLET | Freq: Once | ORAL | Status: AC
  Administered 2022-08-10: 2.5 mg via ORAL
  Filled 2022-08-10: qty 1

## 2022-08-10 MED ORDER — FUROSEMIDE 10 MG/ML IJ SOLN: 80.0000 mg | Freq: Two times a day (BID) | INTRAMUSCULAR | Status: DC

## 2022-08-10 MED ORDER — FUROSEMIDE 10 MG/ML IJ SOLN
80.0000 mg | Freq: Two times a day (BID) | INTRAMUSCULAR | Status: DC
  Administered 2022-08-10: 80 mg via INTRAVENOUS

## 2022-08-10 MED ORDER — MILRINONE LACTATE IN DEXTROSE 20-5 MG/100ML-% IV SOLN
0.2500 ug/kg/min | INTRAVENOUS | Status: DC
  Administered 2022-08-10: 0.125 ug/kg/min via INTRAVENOUS
  Administered 2022-08-11 (×2): 0.25 ug/kg/min via INTRAVENOUS
  Filled 2022-08-10 (×3): qty 100

## 2022-08-10 MED ORDER — HEPARIN (PORCINE) 25000 UT/250ML-% IV SOLN
1250.0000 [IU]/h | INTRAVENOUS | Status: DC
  Administered 2022-08-10: 1050 [IU]/h via INTRAVENOUS
  Administered 2022-08-11: 1250 [IU]/h via INTRAVENOUS
  Filled 2022-08-10 (×2): qty 250

## 2022-08-10 MED ORDER — POTASSIUM CHLORIDE CRYS ER 20 MEQ PO TBCR
40.0000 meq | EXTENDED_RELEASE_TABLET | Freq: Once | ORAL | Status: AC
  Administered 2022-08-10: 40 meq via ORAL
  Filled 2022-08-10: qty 2

## 2022-08-10 MED ORDER — FUROSEMIDE 10 MG/ML IJ SOLN
INTRAMUSCULAR | Status: AC
  Filled 2022-08-10: qty 4

## 2022-08-10 MED ORDER — POTASSIUM CHLORIDE CRYS ER 20 MEQ PO TBCR
40.0000 meq | EXTENDED_RELEASE_TABLET | Freq: Every day | ORAL | Status: DC
  Administered 2022-08-11 – 2022-08-13 (×3): 40 meq via ORAL
  Filled 2022-08-10 (×4): qty 2

## 2022-08-10 NOTE — Progress Notes (Signed)
Right internal jugular placed by surgery. CVP 20, coox 34%, lactic acid 2.1. Will initiate milrinone at 0.125 mcg/kg/min. I have reached out to advanced heart failure team at [facility], working to arrange transfer after discussion with heart failure team   Dominga Ferry MD

## 2022-08-10 NOTE — Procedures (Signed)
Procedure Note  08/10/22   Preoperative Diagnosis: CHF   Postoperative Diagnosis: Same   Procedure(s) Performed: Right internal jugular Central Line placement with ultrasound guidance   Surgeon: Theophilus Kinds, DO   Assistants: None   Anesthesia: 1% lidocaine    Complications: None    Indications: [name] [name] is a 67 y.o. who was admitted with shortness of breath and CHF exacerbation.  He needs central venous catheter insertion for CVP monitoring. I discussed the risk and benefits of placement of the central line with the patient, including but not limited to bleeding, infection, and risk of pneumothorax.  The patient has given verbal consent for the procedure.    Procedure: The patient placed supine. The right chest and neck was prepped and draped in the usual sterile fashion.  Wearing full gown and gloves, I performed the procedure.  A time-out was completed verifying correct patient, procedure, site, positioning, and implant(s) and/or special equipment prior to beginning this procedure.  One percent lidocaine was used for local anesthesia. An ultrasound was utilized to localize above the jugular vein.  Ultrasound was then again used to access the jugular vein.  The needle with syringe was advanced into the jugular vein with dark venous return, and a wire was placed using the Seldinger technique without difficulty.  Ectopia was not noted.  The wire was noted to be within the lumen of the vein on ultrasound.  The skin was knicked and a dilator was placed, and the three lumen catheter was placed over the wire with continued control of the wire.  There was good draw back of blood from all three lumens and each flushed easily with saline.  The catheter was secured in 3 points with 2-0 silk and a biopatch and dressing was placed.     The patient tolerated the procedure well, and the CXR was ordered to confirm position of the central line.   Theophilus Kinds, DO [facility] [address] [place], [place] [address] [phone] (office)

## 2022-08-10 NOTE — Progress Notes (Signed)
ANTICOAGULATION CONSULT NOTE - Initial Consult  Pharmacy Consult for Heparin (Eliquis on hold) Indication: atrial fibrillation  Allergies  Allergen Reactions   Bee Pollen Swelling    Patient Measurements: Height: 6' (182.9 cm) Weight: 82.1 kg (181 lb) IBW/kg (Calculated) : 77.6 Heparin Dosing Weight: total weight  Vital Signs: Temp: 97.6 F (36.4 C) (07/23 2000) Temp Source: Oral (07/23 2000) BP: 100/72 (07/23 2000) Pulse Rate: 74 (07/23 2000)  Labs: Recent Labs    08/08/22 1210 08/08/22 1332 08/08/22 2004 08/09/22 0322 08/10/22 0538  HGB 12.4*  --   --   --   --   HCT 38.0*  --   --   --   --   PLT 264  --   --   --   --   CREATININE 2.45*  --  2.52* 2.36* 2.38*  TROPONINIHS 37* 31*  --   --   --     Estimated Creatinine Clearance: 34 mL/min (A) (by C-G formula based on SCr of 2.38 mg/dL (H)).   Medical History: Past Medical History:  Diagnosis Date   Anxiety    Apical mural thrombus    Ascending aorta dilatation (HCC)    Chronic HFrEF (heart failure with reduced ejection fraction) (HCC)    a. EF 15% in 2013 with cath showing normal cors b. EF 30-35% by repeat echo in 2015   CKD stage 3b, GFR 30-44 ml/min (HCC)    Cocaine use    Diabetes mellitus    History of noncompliance with medical treatment, presenting hazards to health    Hyperlipidemia    Hypertension    Mitral regurgitation    NICM (nonischemic cardiomyopathy) (HCC)    PAD (peripheral artery disease) (HCC)    PAF (paroxysmal atrial fibrillation) (HCC)     Medications:  Scheduled:   aspirin EC  81 mg Oral Q0600   Chlorhexidine Gluconate Cloth  6 each Topical Q0600   furosemide       furosemide  80 mg Intravenous Q12H   insulin aspart  0-5 Units Subcutaneous QHS   insulin aspart  0-9 Units Subcutaneous TID WC   levothyroxine  137 mcg Oral q morning   magnesium oxide  400 mg Oral Daily   metolazone  2.5 mg Oral Once   nicotine  14 mg Transdermal Daily   pantoprazole  40 mg Oral Daily    [START ON 08/11/2022] potassium chloride SA  40 mEq Oral Daily   rosuvastatin  20 mg Oral Daily   sodium chloride flush  3 mL Intravenous Q12H   tamsulosin  0.4 mg Oral Daily   Infusions:   sodium chloride     amiodarone 30 mg/hr (08/10/22 2049)   heparin     milrinone 0.25 mcg/kg/min (08/10/22 2108)   PRN: sodium chloride, acetaminophen **OR** acetaminophen, furosemide, ondansetron **OR** ondansetron (ZOFRAN) IV, mouth rinse, oxyCODONE, sodium chloride flush  Assessment: 67 yo male on chronic apixaban for hx afib and LV apical thrombus. Transferred from [facility] for evaluation by advanced HF team. Pharmacy consulted to transition to heparin in case cath needed.  Last dose of apixaban was today at 09:00. Hgb 12.4, Plts 264  Goal of Therapy:  aPTT 66-102 sec Heparin level 0.3-0.7 Monitor platelets by anticoagulation protocol: Yes   Plan:  Start heparin infusion at 1050 units/hr Continue to monitor H&H and platelets Check aPTT in 8hrs - monitor using aPTT for now as HL will be falsely elevated due to recent apixaban Daily aPTT, HL and CBC  Loralee Pacas, PharmD, BCPS 08/10/2022,9:35 PM  Please check AMION for all [facility] phone numbers After 10:00 PM, call Main Pharmacy [phone]

## 2022-08-10 NOTE — Progress Notes (Signed)
Rounding Note    Patient Name: [name] [name] Date of Encounter: 08/10/2022  [facility] Cardiologist: Dina Rich, MD   Subjective   Some ongoing SOB  Inpatient Medications    Scheduled Meds:  apixaban  5 mg Oral BID   aspirin EC  81 mg Oral Q0600   Chlorhexidine Gluconate Cloth  6 each Topical Q0600   furosemide  40 mg Intravenous Q12H   insulin aspart  0-5 Units Subcutaneous QHS   insulin aspart  0-9 Units Subcutaneous TID WC   levothyroxine  137 mcg Oral q morning   magnesium oxide  400 mg Oral Daily   midodrine  5 mg Oral TID WC   nicotine  14 mg Transdermal Daily   pantoprazole  40 mg Oral Daily   potassium chloride SA  20 mEq Oral Daily   rosuvastatin  20 mg Oral Daily   sodium chloride flush  3 mL Intravenous Q12H   tamsulosin  0.4 mg Oral Daily   Continuous Infusions:  sodium chloride     amiodarone 30 mg/hr (08/10/22 0724)   norepinephrine (LEVOPHED) Adult infusion Stopped (08/08/22 2158)   PRN Meds: sodium chloride, acetaminophen **OR** acetaminophen, ondansetron **OR** ondansetron (ZOFRAN) IV, mouth rinse, oxyCODONE, sodium chloride flush   Vital Signs    Vitals:   08/10/22 0400 08/10/22 0500 08/10/22 0627 08/10/22 0802  BP: 103/66 102/80 (!) 86/64   Pulse: 74 (!) 109 67   Resp: (!) 44 14 (!) 34   Temp: 97.7 F (36.5 C)   (!) 97.5 F (36.4 C)  TempSrc: Oral   Oral  SpO2: (!) 83% 96% (!) 88%   Weight:  82.1 kg    Height:        Intake/Output Summary (Last 24 hours) at 08/10/2022 0857 Last data filed at 08/09/2022 1739 Gross per 24 hour  Intake 785.66 ml  Output 550 ml  Net 235.66 ml      08/10/2022    5:00 AM 08/09/2022    4:24 AM 08/08/2022   10:00 PM  Last 3 Weights  Weight (lbs) 181 lb 181 lb 3.5 oz 180 lb 12.4 oz  Weight (kg) 82.1 kg 82.2 kg 82 kg      Telemetry    SR, short runs NSVT and afib - Personally Reviewed  ECG    N/a - Personally Reviewed  Physical Exam   GEN: No acute distress.   Neck: +  JVD Cardiac: RRR, no murmurs, rubs, or gallops.  Respiratory: faint crackles bilateral bases GI: Soft, nontender, non-distended  MS: No edema; No deformity. Neuro:  Nonfocal  Psych: Normal affect   Labs    High Sensitivity Troponin:   Recent Labs  Lab 08/08/22 1210 08/08/22 1332  TROPONINIHS 37* 31*     Chemistry Recent Labs  Lab 08/08/22 1210 08/08/22 1332 08/08/22 2004 08/09/22 0322 08/10/22 0538  NA 135  --  135 135 134*  K 3.2*  --  3.5 3.3* 3.3*  CL 102  --  103 101 97*  CO2 22  --  23 23 23   GLUCOSE 122*  --  183* 132* 170*  BUN 47*  --  49* 49* 52*  CREATININE 2.45*  --  2.52* 2.36* 2.38*  CALCIUM 8.4*  --  8.0* 8.4* 8.5*  MG 2.6* 2.5*  --   --   --   PROT 7.8  --   --   --   --   ALBUMIN 3.4*  --   --   --   --  AST 18  --   --   --   --   ALT 14  --   --   --   --   ALKPHOS 104  --   --   --   --   BILITOT 1.6*  --   --   --   --   GFRNONAA 28*  --  28* 30* 30*  ANIONGAP 11  --  9 11 14     Lipids No results for input(s): "CHOL", "TRIG", "HDL", "LABVLDL", "LDLCALC", "CHOLHDL" in the last 168 hours.  Hematology Recent Labs  Lab 08/08/22 1210  WBC 8.4  RBC 3.80*  HGB 12.4*  HCT 38.0*  MCV 100.0  MCH 32.6  MCHC 32.6  RDW 15.9*  PLT 264   Thyroid  Recent Labs  Lab 08/08/22 1332  TSH 2.436    BNP Recent Labs  Lab 08/08/22 1211  BNP >4,500.0*    DDimer No results for input(s): "DDIMER" in the last 168 hours.   Radiology    ECHOCARDIOGRAM COMPLETE  Result Date: 08/09/2022    ECHOCARDIOGRAM REPORT   Patient Name:   AEDEN MATRANGA Sinopoli Date of Exam: 08/09/2022 Medical Rec #:  [id number]        Height:       72.0 in Accession #:    [id number]       Weight:       181.2 lb Date of Birth:  1955-08-28       BSA:          2.043 m Patient Age:    66 years         BP:           94/76 mmHg Patient Gender: M                HR:           112 bpm. Exam Location:  Inpatient Procedure: 2D Echo, 3D Echo, Cardiac Doppler, Color Doppler and Intracardiac             Opacification Agent Indications:    I50.40* Unspecified combined systolic (congestive) and diastolic                 (congestive) heart failure  History:        Patient has prior history of Echocardiogram examinations, most                 recent 02/08/2022. CHF and Cardiomyopathy, Abnormal ECG,                 Arrythmias:Atrial Fibrillation; Risk Factors:Current Smoker and                 Hypertension. Apical thrombus.  Sonographer:    Sheralyn Boatman RDCS Referring Phys: [id number] MICHELE M LENZE IMPRESSIONS  1. Left ventricular ejection fraction, by estimation, is <20%. The left ventricle has severely decreased function. The left ventricle demonstrates global hypokinesis. The left ventricular internal cavity size was moderately to severely dilated. Left ventricular diastolic parameters are indeterminate.  2. Right ventricular systolic function is severely reduced. The right ventricular size is moderately enlarged. There is moderately elevated pulmonary artery systolic pressure.  3. Left atrial size was moderately dilated.  4. Right atrial size was moderately dilated.  5. The mitral valve is normal in structure. Moderate to severe mitral valve regurgitation.  6. Tricuspid valve regurgitation is moderate to severe.  7. The aortic valve is tricuspid. Aortic valve regurgitation is not visualized. Aortic valve sclerosis/calcification  is present, without any evidence of aortic stenosis.  8. The inferior vena cava is dilated in size with <50% respiratory variability, suggesting right atrial pressure of 15 mmHg. FINDINGS  Left Ventricle: Left ventricular ejection fraction, by estimation, is <20%. The left ventricle has severely decreased function. The left ventricle demonstrates global hypokinesis. Definity contrast agent was given IV to delineate the left ventricular endocardial borders. The left ventricular internal cavity size was moderately to severely dilated. There is no left ventricular hypertrophy. Left ventricular  diastolic parameters are indeterminate. Right Ventricle: The right ventricular size is moderately enlarged. Right vetricular wall thickness was not assessed. Right ventricular systolic function is severely reduced. There is moderately elevated pulmonary artery systolic pressure. The tricuspid regurgitant velocity is 2.96 m/s, and with an assumed right atrial pressure of 15 mmHg, the estimated right ventricular systolic pressure is 50.0 mmHg. Left Atrium: Left atrial size was moderately dilated. Right Atrium: Right atrial size was moderately dilated. Pericardium: Trivial pericardial effusion is present. Mitral Valve: The mitral valve is normal in structure. Moderate to severe mitral valve regurgitation. Tricuspid Valve: The tricuspid valve is normal in structure. Tricuspid valve regurgitation is moderate to severe. Aortic Valve: The aortic valve is tricuspid. Aortic valve regurgitation is not visualized. Aortic valve sclerosis/calcification is present, without any evidence of aortic stenosis. Pulmonic Valve: The pulmonic valve was normal in structure. Pulmonic valve regurgitation is mild. Aorta: The aortic root and ascending aorta are structurally normal, with no evidence of dilitation. Venous: The inferior vena cava is dilated in size with less than 50% respiratory variability, suggesting right atrial pressure of 15 mmHg. IAS/Shunts: No atrial level shunt detected by color flow Doppler.  LEFT VENTRICLE PLAX 2D LVIDd:         6.00 cm   Diastology LVIDs:         5.70 cm   LV e' medial:    2.54 cm/s LV PW:         1.10 cm   LV E/e' medial:  53.9 LV IVS:        1.20 cm   LV e' lateral:   7.05 cm/s LVOT diam:     2.50 cm   LV E/e' lateral: 19.4 LV SV:         34 LV SV Index:   17 LVOT Area:     4.91 cm  RIGHT VENTRICLE            IVC RV S prime:     5.51 cm/s  IVC diam: 2.40 cm TAPSE (M-mode): 1.0 cm LEFT ATRIUM           Index        RIGHT ATRIUM           Index LA diam:      4.70 cm 2.30 cm/m   RA Area:     22.10 cm  LA Vol (A2C): 85.8 ml 41.99 ml/m  RA Volume:   77.70 ml  38.03 ml/m LA Vol (A4C): 75.7 ml 37.05 ml/m  AORTIC VALVE             PULMONIC VALVE LVOT Vmax:   64.90 cm/s  PR End Diast Vel: 2.08 msec LVOT Vmean:  38.600 cm/s LVOT VTI:    0.070 m  AORTA Ao Root diam: 3.00 cm Ao Asc diam:  3.70 cm MITRAL VALVE                TRICUSPID VALVE MV Area (PHT): 4.49 cm     TR  Peak grad:   35.0 mmHg MV Decel Time: 169 msec     TR Vmax:        296.00 cm/s MV E velocity: 137.00 cm/s                             SHUNTS                             Systemic VTI:  0.07 m                             Systemic Diam: 2.50 cm Dietrich Pates MD Electronically signed by Dietrich Pates MD Signature Date/Time: 08/09/2022/4:46:15 PM    Final    DG Chest Port 1 View  Result Date: 08/08/2022 CLINICAL DATA:  Shortness of breath with nonproductive cough. History of congestive heart failure. EXAM: PORTABLE CHEST 1 VIEW COMPARISON:  Radiographs 04/19/2022 and 02/08/2022.  CT 03/12/2020. FINDINGS: 1213 hours. Stable cardiomegaly, vascular congestion and right greater than left pleural effusions. Bibasilar airspace opacities appear unchanged. There is no pneumothorax. The bones appear unchanged. Multiple telemetry leads overlie the chest. IMPRESSION: Unchanged appearance of the chest with cardiomegaly, vascular congestion and bilateral pleural effusions likely due to chronic congestive heart failure. Electronically Signed   By: Carey Bullocks M.D.   On: 08/08/2022 12:27    Cardiac Studies    Patient Profile     AKRAM KISSICK is a 67 y.o. male with a hx of NICM who is being seen 08/09/2022 for the evaluation of Acute CHF at the request of Dr. Gwenlyn Perking.   Assessment & Plan    1.Acute on chronic HFrEF -Jan 2024 echo: LVEF <20%, grade III dd, mild RV dysfunction, severe BAE, + apical thrombus - CXR pulm congestion, bilateral plerual effusion. BNP >4500  07/2022 echo: LVEF <20%, indet diastolic, severe RV dysfunction, mod to severe MR, mod to  severe TR, RA pressure 15  I/Os incomplete. He is on IV lasix 40mg  bid, PM dose was skipped due to low bp's. Reported weights 180-181 without significant decline, he was 174 lbs 04/2022 clinic visit. Reds vest 20%.  Mild variations in Cr without clear trend. - JVD remains elevated, some crackles on exam, extremities somewhat cool. He reports SOB remains moderate, not back to his baseline.  - repeat CXR F/u repeat studies today, may warrant advanced HF evaluation, low output may be playing a role.  - will discuss with primary team having surgery play a central line for coox and CVP   - medical therapy limited by renal dysfunction, soft bp's. Would have held entersto for now, avoid MRAs. GFR is borderline for SGLT2i, hold for now in case procedures are indicated during admission. Has not been on beta blocker due to  low CI by prior RHC. CUrrentlyt on low dose midodrine for soft bp's.      2.History of apical thrombus - he is on eliquis - thrombus not noted on echo this admit     3. PAF - he is on amio, eliquis - has had intermittent afib with RVR this admission on tele, held his oral amio and started   amio gtt.  - afib much improved on amio gtt, some rare paroxysms early AM, continue amio gtt today and likely transition to oral tomorrow   4. PAD - ASA added to eliquis  it appears started  during Jan 2024 admissionLE thromboembolectomy, defer ongoing ASA use to vascular. Needs eliquis long term for his PAF as well as apical thrombus  5. NSVT -short rare runs. K 3.3 we are replacing. Check Mg.     For questions or updates, please contact  [facility] Please consult [email or web address] for contact info under        Signed, Dina Rich, MD  08/10/2022, 8:57 AM

## 2022-08-10 NOTE — Consult Note (Signed)
Advanced Heart Failure Team Consult Note   Primary Physician: Rica Records, FNP PCP-Cardiologist:  Dina Rich, MD  Reason for Consultation: Cardiogenic Shock  HPI:    [name] [name] is seen today for evaluation of low output state in acute on chronic systolic heart failure at the request of Dr. Wyline Mood.   67 y/o male with PMH of 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 Admitted for hypoxemic respiratory failure 2/2 acute HFrEF exacerbation and transferred to [facility] for progressive BiV systolic heart failure and low output state.   He had been out of lasix for over 1 week. CXR showed pulmonary vascular congestion and bilateral pleural effusions which appear worse (especially on the right side) on repeat CXR today. BNP >4500, hsTrop 37-31, sCr 2.45 (BL 1.8), low O2 on ABG, and EKG unremarkable. He was started on IV diuresis without significant improvement. Repeat TTE w/ severe biventricular failure and at least moderate MR. No apical thrombus noted. Due to no significant improvement on IV diuretic a central line was placed. CVP 20, Co-ox 33.7, sCR stable at 2.38, and lactic acid 2.1. He was started on milrinone 0.125 mcg/kg/min and transferred to [facility].   Patient was diagnosed with CHF in 2013. At that time, EF was 15%. 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 Since that time, repeat echoes have shown EF < 35%. Most recent study was a cardiac MRI in 1/22. This showed LV EF 26%. 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 FU cardiac PET that did not show active cardiac sarcoidosis   The patient was hospitalized 01/2022 for right lower extremity ischemia due to occlusion of the right SFA  and popliteal artery with ultimate right lower extremity thrombectomy and right tibial peroneal trunk endarterectomy by vascular. Cardiology followed due to echo with EF less than 20% with large mobile apical thrombus, also with mild MR and mild dilation of ascending aorta.  He did report noncompliance with his medications prior to admission and had not been taking Eliquis or Entresto. Entresto and spirononlactone were held given hypotension and variable renal function.   He was readmitted 4/1-04/24/22 with hypoxemic respiratory failure secondary to acute on chronic combined CHF and seen by our team. He required Lasix drip. Metoprolol was discontinued due to severe LV dysfunction. He was started on Entresto at discharge.  Home Medications Prior to Admission medications   Medication Sig Start Date End Date Taking? Authorizing Provider  amiodarone (PACERONE) 200 MG tablet TAKE 1/2 TABLET EVERY DAY 06/02/22  Yes Laurey Morale, MD  aspirin EC 81 MG tablet Take 1 tablet (81 mg total) by mouth daily at 6 (six) AM. Swallow whole. 02/11/22  Yes Vonna Drafts, MD  dapagliflozin propanediol (FARXIGA) 10 MG TABS tablet Take 1 tablet (10 mg total) by mouth daily. 02/11/22  Yes McDiarmid, Leighton Roach, MD  ELIQUIS 5 MG TABS tablet TAKE ONE TABLET BY MOUTH 2 TIMES A DAY 07/26/22  Yes Branch, Dorothe Pea, MD  furosemide (LASIX) 40 MG tablet Take 1 tablet (40 mg total) by mouth 2 (two) times daily. 04/25/22 04/25/23 Yes Shah, Pratik D, DO  levothyroxine (SYNTHROID) 137 MCG tablet Take 137 mcg by mouth every morning. 11/09/20  Yes [provider]  MAGNESIUM  OXIDE PO Take 1 tablet by mouth daily.   Yes [provider]  polyethylene glycol powder (GLYCOLAX/MIRALAX) 17 GM/SCOOP powder Take 17 g by mouth daily. Patient taking differently: Take 17 g by mouth daily as needed (constipation). 02/10/22  Yes Erick Alley, DO  potassium chloride SA (KLOR-CON M) 20 MEQ tablet Take 1 tablet (20 mEq total) by mouth daily. 04/24/22  09/25/22 Yes Shah, Pratik D, DO  sacubitril-valsartan (ENTRESTO) 24-26 MG TAKE (1) TABLET BY MOUTH TWICE DAILY. 07/19/22  Yes BranchDorothe Pea, MD    Past Medical History: Past Medical History:  Diagnosis Date   Anxiety    Apical mural thrombus    Ascending aorta dilatation (HCC)    Chronic HFrEF (heart failure with reduced ejection fraction) (HCC)    a. EF 15% in 2013 with cath showing normal cors b. EF 30-35% by repeat echo in 2015   CKD stage 3b, GFR 30-44 ml/min (HCC)    Cocaine use    Diabetes mellitus    History of noncompliance with medical treatment, presenting hazards to health    Hyperlipidemia    Hypertension    Mitral regurgitation    NICM (nonischemic cardiomyopathy) (HCC)    PAD (peripheral artery disease) (HCC)    PAF (paroxysmal atrial fibrillation) (HCC)     Past Surgical History: Past Surgical History:  Procedure Laterality Date   COLONOSCOPY  01/04/2012   Procedure: COLONOSCOPY;  Surgeon: West Bali, MD;  Location: [facility] SUITE;  Service: Endoscopy;  Laterality: N/A;  1:30 PM   ENDARTERECTOMY POPLITEAL Right 02/08/2022   Procedure: TIBIAL PERONEAL ENDARTERECTOMY;  Surgeon: Maeola Harman, MD;  Location[facility] OR;  Service: Vascular;  Laterality: Right;   HERNIA REPAIR     LEFT AND RIGHT HEART CATHETERIZATION WITH CORONARY ANGIOGRAM N/A 07/09/2011   Procedure: LEFT AND RIGHT HEART CATHETERIZATION WITH CORONARY ANGIOGRAM;  Surgeon: Peter M Swaziland, MD;  Location: [facility] CATH LAB;  Service: Cardiovascular;  Laterality: N/A;   RIGHT/LEFT HEART CATH AND CORONARY ANGIOGRAPHY N/A 02/25/2020   Procedure: RIGHT/LEFT HEART CATH AND CORONARY ANGIOGRAPHY;  Surgeon: Laurey Morale, MD;  Location: [facility] INVASIVE CV LAB;  Service: Cardiovascular;  Laterality: N/A;   THROMBECTOMY FEMORAL ARTERY Right 02/08/2022   Procedure: RIGHT LOWER THROMBECTOMY WITH VEIN PATCH;  Surgeon: Maeola Harman, MD;  Location: [facility] OR;  Service: Vascular;  Laterality: Right;   VEIN  HARVEST Right 02/08/2022   Procedure: RIGHT LOWER SAPHENOUS VEIN HARVEST;  Surgeon: Maeola Harman, MD;  Location: [facility] OR;  Service: Vascular;  Laterality: Right;    Family History: Family History  Problem Relation Age of Onset   Heart attack Father    Heart failure Father    Cancer Mother        Multiple myeloma   Heart disease Sister     Social History: Social History   Socioeconomic History   Marital status: Married    Spouse name: Not on file   Number of children: 0   Years of education: Not on file   Highest education level: Not on file  Occupational History   Occupation: disabled  Tobacco Use   Smoking status: Some Days    Current packs/day: 0.00    Average packs/day: 0.3 packs/day for 28.9 years (7.2 ttl pk-yrs)    Types: Cigarettes, E-cigarettes    Start date: 10/13/1982    Last attempt to quit: 09/19/2011    Years since quitting: 10.8    Passive exposure: Never   Smokeless tobacco: Never  Tobacco comments:    electronic cig for 4 months   Vaping Use   Vaping status: Some Days   Substances: Flavoring  Substance and Sexual Activity   Alcohol use: Yes    Comment: 1-2 beers per week   Drug use: No   Sexual activity: Not on file  Other Topics Concern   Not on file  Social History Narrative   Patient is right handed.   Patient seldom drinks caffeine.   Social Determinants of Health   Financial Resource Strain: Medium Risk (04/07/2022)   Overall Financial Resource Strain (CARDIA)    Difficulty of Paying Living Expenses: Somewhat hard  Food Insecurity: No Food Insecurity (04/20/2022)   Hunger Vital Sign    Worried About Running Out of Food in the Last Year: Never true    Ran Out of Food in the Last Year: Never true  Transportation Needs: No Transportation Needs (04/20/2022)   PRAPARE - Administrator, Civil Service (Medical): No    Lack of Transportation (Non-Medical): No  Physical Activity: Inactive (04/07/2022)   Exercise Vital Sign     Days of Exercise per Week: 0 days    Minutes of Exercise per Session: 0 min  Stress: No Stress Concern Present (04/07/2022)   [employer] of Occupational Health - Occupational Stress Questionnaire    Feeling of Stress : Not at all  Social Connections: Moderately Isolated (04/07/2022)   Social Connection and Isolation Panel [NHANES]    Frequency of Communication with Friends and Family: Twice a week    Frequency of Social Gatherings with Friends and Family: Never    Attends Religious Services: 1 to 4 times per year    Active Member of [employer] or Organizations: No    Attends Banker Meetings: Never    Marital Status: Married    Allergies:  Allergies  Allergen Reactions   Bee Pollen Swelling    Objective:    Vital Signs:   Temp:  [97.5 F (36.4 C)-98 F (36.7 C)] 98 F (36.7 C) (07/23 1100) Pulse Rate:  [43-110] 80 (07/23 1300) Resp:  [10-44] 25 (07/23 1300) BP: (80-164)/(52-149) 93/59 (07/23 1300) SpO2:  [83 %-100 %] 99 % (07/23 1300) Weight:  [82.1 kg] 82.1 kg (07/23 0500) Last BM Date : 08/07/22  Weight change: Filed Weights   08/08/22 2200 08/09/22 0424 08/10/22 0500  Weight: 82 kg 82.2 kg 82.1 kg    Intake/Output:   Intake/Output Summary (Last 24 hours) at 08/10/2022 1425 Last data filed at 08/10/2022 1300 Gross per 24 hour  Intake 1214.72 ml  Output 1350 ml  Net -135.28 ml      Physical Exam    General:  chronically ill appearing AAM HEENT: normal Neck: supple. JVP to mandible . Carotids 2+ bilat; no bruits. No lymphadenopathy or thyromegaly appreciated. Cor: PMI nondisplaced. Regular rate & rhythm. No rubs, gallops ; +2/6 systolic murmur at LSB and apex.  Lungs: decreased lung sounds b/l; mild inspiratory crackles.  Abdomen: soft, nontender, nondistended. No hepatosplenomegaly. No bruits or masses. Good bowel sounds. Extremities: cool to touch with +2 edema to the knees Neuro: alert & orientedx3, cranial nerves grossly intact. moves all 4  extremities w/o difficulty. Affect pleasant   Telemetry   Atrial fibrillation, rate controlled.   EKG    08/09/2022- Atrial fibrillation with rate 117. RBBB. Faster but stable from prior.  Labs   Basic Metabolic Panel: Recent Labs  Lab 08/08/22 1210 08/08/22 1332 08/08/22 2004 08/09/22 2841 08/10/22 3244  NA 135  --  135 135 134*  K 3.2*  --  3.5 3.3* 3.3*  CL 102  --  103 101 97*  CO2 22  --  23 23 23   GLUCOSE 122*  --  183* 132* 170*  BUN 47*  --  49* 49* 52*  CREATININE 2.45*  --  2.52* 2.36* 2.38*  CALCIUM 8.4*  --  8.0* 8.4* 8.5*  MG 2.6* 2.5*  --   --  2.4  PHOS  --  4.2  --   --   --     Liver Function Tests: Recent Labs  Lab 08/08/22 1210  AST 18  ALT 14  ALKPHOS 104  BILITOT 1.6*  PROT 7.8  ALBUMIN 3.4*   No results for input(s): "LIPASE", "AMYLASE" in the last 168 hours. No results for input(s): "AMMONIA" in the last 168 hours.  CBC: Recent Labs  Lab 08/08/22 1210  WBC 8.4  NEUTROABS 6.4  HGB 12.4*  HCT 38.0*  MCV 100.0  PLT 264    Cardiac Enzymes: No results for input(s): "CKTOTAL", "CKMB", "CKMBINDEX", "TROPONINI" in the last 168 hours.  BNP: BNP (last 3 results) Recent Labs    04/23/22 0638 08/08/22 1211 08/10/22 0926  BNP 1,260.0* >4,500.0* 2,475.0*    ProBNP (last 3 results) No results for input(s): "PROBNP" in the last 8760 hours.   CBG: Recent Labs  Lab 08/09/22 1141 08/09/22 1600 08/09/22 2120 08/10/22 0742 08/10/22 1105  GLUCAP 163* 149* 252* 193* 213*    Coagulation Studies: No results for input(s): "LABPROT", "INR" in the last 72 hours.   Imaging   No results found.   Medications:     Current Medications:  apixaban  5 mg Oral BID   aspirin EC  81 mg Oral Q0600   Chlorhexidine Gluconate Cloth  6 each Topical Q0600   furosemide  40 mg Intravenous Q12H   insulin aspart  0-5 Units Subcutaneous QHS   insulin aspart  0-9 Units Subcutaneous TID WC   levothyroxine  137 mcg Oral q morning   magnesium  oxide  400 mg Oral Daily   midodrine  5 mg Oral TID WC   nicotine  14 mg Transdermal Daily   pantoprazole  40 mg Oral Daily   potassium chloride SA  20 mEq Oral Daily   rosuvastatin  20 mg Oral Daily   sodium chloride flush  3 mL Intravenous Q12H   tamsulosin  0.4 mg Oral Daily    Infusions:  sodium chloride     amiodarone 30 mg/hr (08/10/22 1100)   milrinone 0.125 mcg/kg/min (08/10/22 1238)      Patient Profile   67 y/o male with PMH of 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 Admitted for hypoxemic respiratory failure 2/2 acute HFrEF exacerbation and transferred to [facility] for progressive BiV systolic heart failure and low output state.   Assessment/Plan   SCAI C Cardiogenic Shock Severe biventricular failure on TTE from 08/09/22; likely exacerbated by medication non-compliance & progressive low output HF.  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  Increase milrinone to 0.45mcg/kg/min; repeat mixed venous. Will likely need to escalate to 0.344mcg/kg/min. Repeat lactic acid pending.  Increase lasix to 80mg  IV BID, CVP 15 on my exam.  Check Co-ox on arrival, serial  CVP, daily co-ox/BMP/Mg/weights, strict I/O's, 1200 mL fluid restriction Not a candidate for advanced therapies at this time. RV failure and multiple comorbid conditions preclude him from LVAD or transplant. Low threshold for RHC tomorrow AM.  Paroxysmal A-fib/RBBB/NSVT Been in afib since admission, changed from po amio to amio gtt, short intermittent runs of NSVT Continue amio gtt for rate control D/C apixaban; start hep gtt until lactic acid normalizes. May need RHC +/- RHC.  AKI on CKD stage IIIb Cr peak 2.52 and down to 2.38 today. Baseline 1.8. 800 mL UOP today.  Monitor BMP and urine output  closely Progressive mod/severe MR and new mod/severe TR Plan for repeat TTE once stable; may benefit from TEER in the future.  Hx of apical thrombus first seen in 2021 Most recent TTE yesterday without thrombus, he will continue on anticoagulation for atrial fibrillation anyway Hep gtt OSA/HTN Severe OSA on outpatient sleep study CPAP nightly Holding antihypertensives for low BP Non-obstructive CAD/PAD/HLD No signs of ACS Continue aspirin 81 mg daily Tobacco use disorder Encourage smoking cessation T2DM/Hypothyroidism Per primary  Length of Stay: 2  Jenetta Wease 9:32 PM   Advanced Heart Failure Team Pager [phone] (M-F; 7a - 5p)  Please contact [facility] for night-coverage after hours (4p -7a ) and weekends on [email or web address]

## 2022-08-10 NOTE — Progress Notes (Signed)
Progress Note   Patient: [name] [name]:[id number] DOB: 1955/09/18 DOA: 08/08/2022     2 DOS: the patient was seen and examined on 08/10/2022   Brief hospital admission narrative: [name] [name] is a 67 y.o. male with medical history significant of type 2 diabetes with nephropathy, hypertension, hyperlipidemia, paroxysmal atrial fibrillation on chronic Eliquis, chronic systolic heart failure and chronic kidney disease stage IIIa; who presented to the emergency department secondary to increased shortness of breath and lower extremity swelling.  Patient reports missing couple doses of his Lasix and over the last week prior to admission has noticed increased shortness of breath with exertion and also orthopnea.  Patient expressed dry coughing spells and has noticed increase in his abdominal girth.  No chest pain, no nausea, no vomiting, no dysuria, no hematuria, no focal weaknesses, no sick contacts or any other complaints. Workup in the ED with a chest x-ray demonstrating vascular congestion and chronic bilateral pleural effusion suggesting congestive heart failure; elevated BNP (>4500) and positive crackles on physical exam.  COVID PCR negative.   IV Lasix has been provided and [facility] contacted to place patient in the hospital for further evaluation and management of acute on chronic heart failure exacerbation.  Assessment and Plan: * Acute on chronic systolic HF (heart failure) (HCC) -Recent echocardiogram in January 2024 demonstrating EF less than 20% -Elevated BNP, short winded sensation with activity and positive orthopnea -Patient also expressed increased lower extremity swelling -Chest x-ray with vascular congestion and bilateral pleural effusion; BNP > 4500 -continue daily weights, low sodium diet and strict I's and O's -will continue IV lasix; BNP has trended down. -therapy limited by soft BP and renal failure -holding farxiga and entresto currently. -following cardiology service  recommendations Central line has been placed in morning to monitor CVP and given elevated lactic acid with poor Coox panel milrinone has been started.  Advanced heart failure service has been consulted to receive further guidance and determine necessity for patient to be transferred to [facility]. -Continue to follow clinical response -long term prognosis is poor.  Hypothyroidism -TSH WNL. -Continue Synthroid.  History of DVT (deep vein thrombosis) -Continue chronic anticoagulation with the use of Eliquis -Patient reports no redness, pain or uneven swelling on his legs.  Tobacco use disorder -Cessation counseling provided. -Nicotine patch has been ordered.  PAF (paroxysmal atrial fibrillation) (HCC) -Continue the use of amiodarone, but given component of RVR will do amiodarone drip instead of oral meds. -continue Eliquis for secondary prevention -continue Telemetry monitoring -Follow electrolytes and replete as needed; plan is for magnesium above 2 and potassium above 4 as much as possible. -will follow cardiology service rec's.    AKI on Chronic renal failure, stage 3a (HCC) -Patient with acute kidney injury on chronic renal failure; creatinine is close to baseline, but higher; GFR at baseline. -continue close monitoring  -Continue to follow electrolytes trend and replete as needed. -continue to minimize nephrotoxic agents, avoid hypotension and the use of contrast.  Hyperlipidemia -Continue statin -Heart healthy diet discussed with patient.  Type 2 diabetes with nephropathy (HCC) -continue to Follow CBGs fluctuation -continue Sliding scale insulin  -modified carbohydrates diet encouraged..   Subjective:  No fever, no chest pain, no nausea or vomiting; reporting short winded sensation with minimal activity and positive orthopnea.  Patient is afebrile.  Blood pressure on the soft to low side.  Physical Exam: Vitals:   08/10/22 0802 08/10/22 0900 08/10/22 1100  08/10/22 1200  BP: (!) 93/52 94/61 103/69  91/65  Pulse: 80 81 80   Resp: 18 18 18 20   Temp: (!) 97.5 F (36.4 C)  98 F (36.7 C)   TempSrc: Oral  Oral   SpO2: 93% 93% 100% 98%  Weight:      Height:       General exam: Alert, awake, oriented x 3; reports feeling slightly better; no chest pain, no nausea vomiting.  Still reporting shortness of breath, orthopnea and having signs of fluid overload Respiratory system: Decreased breath sounds at the bases; positive fine crackles. Cardiovascular system: Rate controlled, no rubs, no gallops. Gastrointestinal system: Abdomen is nondistended, soft and nontender.  Increased abdominal girth; positive bowel sounds. Central nervous system: Alert and oriented. No focal neurological deficits. Extremities: No cyanosis; no clubbing.  Trace edema appreciated bilaterally. Skin: No petechiae. Psychiatry: Judgement and insight appear normal. Mood & affect appropriate.   Data Reviewed: Basic metabolic panel: 134, potassium 3.3, chloride 97, bicarb 23, glucose 170, BUN 52, creatinine 2.38 and GFR 30 Magnesium: 2.4 Lactic acid: 2.1 BNP: 2475 COOX panel: Total hemoglobin 12.1, O2 saturation 33.7%; carboxyhemoglobin 0.8 and methemoglobin <  0.7   Family Communication: no family at Bedside.  Disposition: Status is: Inpatient Remains inpatient appropriate because: continue IV diuresis and rate controlling agents.    Planned Discharge Destination: Home    Time spent: 50 minutes  Author: Vassie Loll, MD 08/10/2022 12:34 PM  For on call review [email or web address].

## 2022-08-10 NOTE — Consult Note (Signed)
[facility] Consult  Reason for Consult: Central venous catheter insertion Referring Physician: Dr. Gwenlyn Perking  Chief Complaint   Shortness of Breath     HPI: [name] [name] is a 67 y.o. male who was admitted with shortness of breath and concern for an acute on chronic CHF exacerbation.  He has a past medical history significant for anxiety, CHF, CKD, diabetes, hypertension, hyperlipidemia, atrial fibrillation, and PAD.  Patient was evaluated by cardiology, who wanted to proceed with heart failure workup.  For this reason, they requested general surgery consultation for central line placement to obtain CVP.  Patient denies ever having any procedures on his neck in the past.  His surgical history is significant for a hernia repair, heart catheterizations, and femoral artery thrombectomy.  He is currently on Eliquis.  Denies fevers and chills.  His shortness of breath has slightly improved since being in the hospital.  Past Medical History:  Diagnosis Date   Anxiety    Apical mural thrombus    Ascending aorta dilatation (HCC)    Chronic HFrEF (heart failure with reduced ejection fraction) (HCC)    a. EF 15% in 2013 with cath showing normal cors b. EF 30-35% by repeat echo in 2015   CKD stage 3b, GFR 30-44 ml/min (HCC)    Cocaine use    Diabetes mellitus    History of noncompliance with medical treatment, presenting hazards to health    Hyperlipidemia    Hypertension    Mitral regurgitation    NICM (nonischemic cardiomyopathy) (HCC)    PAD (peripheral artery disease) (HCC)    PAF (paroxysmal atrial fibrillation) (HCC)     Past Surgical History:  Procedure Laterality Date   COLONOSCOPY  01/04/2012   Procedure: COLONOSCOPY;  Surgeon: West Bali, MD;  [facility];  Service: Endoscopy;  Laterality: N/A;  1:30 PM   ENDARTERECTOMY POPLITEAL Right 02/08/2022   Procedure: TIBIAL PERONEAL ENDARTERECTOMY;  Surgeon: Maeola Harman, MD;  Location: [facility]  OR;  Service: Vascular;  Laterality: Right;   HERNIA REPAIR     LEFT AND RIGHT HEART CATHETERIZATION WITH CORONARY ANGIOGRAM N/A 07/09/2011   Procedure: LEFT AND RIGHT HEART CATHETERIZATION WITH CORONARY ANGIOGRAM;  Surgeon: Peter M Swaziland, MD;  Location: [facility] CATH LAB;  Service: Cardiovascular;  Laterality: N/A;   RIGHT/LEFT HEART CATH AND CORONARY ANGIOGRAPHY N/A 02/25/2020   Procedure: RIGHT/LEFT HEART CATH AND CORONARY ANGIOGRAPHY;  Surgeon: Laurey Morale, MD;  Location: [facility] INVASIVE CV LAB;  Service: Cardiovascular;  Laterality: N/A;   THROMBECTOMY FEMORAL ARTERY Right 02/08/2022   Procedure: RIGHT LOWER THROMBECTOMY WITH VEIN PATCH;  Surgeon: Maeola Harman, MD;  Location: [facility] OR;  Service: Vascular;  Laterality: Right;   VEIN HARVEST Right 02/08/2022   Procedure: RIGHT LOWER SAPHENOUS VEIN HARVEST;  Surgeon: Maeola Harman, MD;  Location: [facility] OR;  Service: Vascular;  Laterality: Right;    Family History  Problem Relation Age of Onset   Heart attack Father    Heart failure Father    Cancer Mother        Multiple myeloma   Heart disease Sister     Social History   Tobacco Use   Smoking status: Some Days    Current packs/day: 0.00    Average packs/day: 0.3 packs/day for 28.9 years (7.2 ttl pk-yrs)    Types: Cigarettes, E-cigarettes    Start date: 10/13/1982    Last attempt to quit: 09/19/2011    Years since quitting: 10.8    Passive  exposure: Never   Smokeless tobacco: Never   Tobacco comments:    electronic cig for 4 months   Vaping Use   Vaping status: Some Days   Substances: Flavoring  Substance Use Topics   Alcohol use: Yes    Comment: 1-2 beers per week   Drug use: No    Medications: I have reviewed the patient's current medications. Prior to Admission:  Medications Prior to Admission  Medication Sig Dispense Refill Last Dose   amiodarone (PACERONE) 200 MG tablet TAKE 1/2 TABLET EVERY DAY 45 tablet 3 08/08/2022   aspirin EC 81 MG tablet Take 1  tablet (81 mg total) by mouth daily at 6 (six) AM. Swallow whole. 30 tablet 12 08/08/2022   dapagliflozin propanediol (FARXIGA) 10 MG TABS tablet Take 1 tablet (10 mg total) by mouth daily. 30 tablet 11 08/08/2022   ELIQUIS 5 MG TABS tablet TAKE ONE TABLET BY MOUTH 2 TIMES A DAY 60 tablet 0 08/08/2022 at 0830   furosemide (LASIX) 40 MG tablet Take 1 tablet (40 mg total) by mouth 2 (two) times daily. 30 tablet 11 08/08/2022   levothyroxine (SYNTHROID) 137 MCG tablet Take 137 mcg by mouth every morning.   08/08/2022   MAGNESIUM OXIDE PO Take 1 tablet by mouth daily.   08/08/2022   polyethylene glycol powder (GLYCOLAX/MIRALAX) 17 GM/SCOOP powder Take 17 g by mouth daily. (Patient taking differently: Take 17 g by mouth daily as needed (constipation).) 500 g 0 UNK   potassium chloride SA (KLOR-CON M) 20 MEQ tablet Take 1 tablet (20 mEq total) by mouth daily. 30 tablet 0 08/08/2022   sacubitril-valsartan (ENTRESTO) 24-26 MG TAKE (1) TABLET BY MOUTH TWICE DAILY. 60 tablet 3 08/08/2022   Scheduled:  apixaban  5 mg Oral BID   aspirin EC  81 mg Oral Q0600   Chlorhexidine Gluconate Cloth  6 each Topical Q0600   furosemide  40 mg Intravenous Q12H   insulin aspart  0-5 Units Subcutaneous QHS   insulin aspart  0-9 Units Subcutaneous TID WC   levothyroxine  137 mcg Oral q morning   magnesium oxide  400 mg Oral Daily   midodrine  5 mg Oral TID WC   nicotine  14 mg Transdermal Daily   pantoprazole  40 mg Oral Daily   potassium chloride SA  20 mEq Oral Daily   rosuvastatin  20 mg Oral Daily   sodium chloride flush  3 mL Intravenous Q12H   tamsulosin  0.4 mg Oral Daily   Continuous:  sodium chloride     amiodarone 30 mg/hr (08/10/22 1100)   WUJ:WJXBJY chloride, acetaminophen **OR** acetaminophen, ondansetron **OR** ondansetron (ZOFRAN) IV, mouth rinse, oxyCODONE, sodium chloride flush  Allergies  Allergen Reactions   Bee Pollen Swelling     ROS:  Pertinent items are noted in HPI.  Blood pressure  94/61, pulse 81, temperature (!) 97.5 F (36.4 C), temperature source Oral, resp. rate 18, height 6' (1.829 m), weight 82.1 kg, SpO2 93%. Physical Exam Vitals reviewed.  Constitutional:      Appearance: He is well-developed.  HENT:     Head: Normocephalic and atraumatic.  Eyes:     Extraocular Movements: Extraocular movements intact.     Pupils: Pupils are equal, round, and reactive to light.  Neck:     Vascular: JVD present.  Cardiovascular:     Rate and Rhythm: Normal rate.  Pulmonary:     Effort: Pulmonary effort is normal.  Abdominal:     Palpations: Abdomen is  soft.     Tenderness: There is no abdominal tenderness.  Musculoskeletal:        General: Normal range of motion.     Cervical back: Normal range of motion.  Skin:    General: Skin is warm.  Neurological:     General: No focal deficit present.     Mental Status: He is alert and oriented to person, place, and time.  Psychiatric:        Mood and Affect: Mood normal.        Behavior: Behavior normal.     Results: Results for orders placed or performed during the hospital encounter of 08/08/22 (from the past 48 hour(s))  Resp panel by RT-PCR (RSV, Flu A&B, Covid) Anterior Nasal Swab     Status: None   Collection Time: 08/08/22 12:00 PM   Specimen: Anterior Nasal Swab  Result Value Ref Range   SARS Coronavirus 2 by RT PCR NEGATIVE NEGATIVE    Comment: (NOTE) SARS-CoV-2 target nucleic acids are NOT DETECTED.  The SARS-CoV-2 RNA is generally detectable in upper respiratory specimens during the acute phase of infection. The lowest concentration of SARS-CoV-2 viral copies this assay can detect is 138 copies/mL. A negative result does not preclude SARS-Cov-2 infection and should not be used as the sole basis for treatment or other patient management decisions. A negative result may occur with  improper specimen collection/handling, submission of specimen other than nasopharyngeal swab, presence of viral mutation(s)  within the areas targeted by this assay, and inadequate number of viral copies(<138 copies/mL). A negative result must be combined with clinical observations, patient history, and epidemiological information. The expected result is Negative.  Fact Sheet for Patients:  BloggerCourse.com  Fact Sheet for Healthcare Providers:  SeriousBroker.it  This test is no t yet approved or cleared by the [place] FDA and  has been authorized for detection and/or diagnosis of SARS-CoV-2 by FDA under an Emergency Use Authorization (EUA). This EUA will remain  in effect (meaning this test can be used) for the duration of the COVID-19 declaration under Section 564(b)(1) of the Act, 21 U.S.C.section 360bbb-3(b)(1), unless the authorization is terminated  or revoked sooner.       Influenza A by PCR NEGATIVE NEGATIVE   Influenza B by PCR NEGATIVE NEGATIVE    Comment: (NOTE) The Xpert Xpress SARS-CoV-2/FLU/RSV plus assay is intended as an aid in the diagnosis of influenza from Nasopharyngeal swab specimens and should not be used as a sole basis for treatment. Nasal washings and aspirates are unacceptable for Xpert Xpress SARS-CoV-2/FLU/RSV testing.  Fact Sheet for Patients: BloggerCourse.com  Fact Sheet for Healthcare Providers: SeriousBroker.it  This test is not yet approved or cleared by the [place] FDA and has been authorized for detection and/or diagnosis of SARS-CoV-2 by FDA under an Emergency Use Authorization (EUA). This EUA will remain in effect (meaning this test can be used) for the duration of the COVID-19 declaration under Section 564(b)(1) of the Act, 21 U.S.C. section 360bbb-3(b)(1), unless the authorization is terminated or revoked.     Resp Syncytial Virus by PCR NEGATIVE NEGATIVE    Comment: (NOTE) Fact Sheet for  Patients: BloggerCourse.com  Fact Sheet for Healthcare Providers: SeriousBroker.it  This test is not yet approved or cleared by the [place] FDA and has been authorized for detection and/or diagnosis of SARS-CoV-2 by FDA under an Emergency Use Authorization (EUA). This EUA will remain in effect (meaning this test can be used) for the duration of the COVID-19 declaration  under Section 564(b)(1) of the Act, 21 U.S.C. section 360bbb-3(b)(1), unless the authorization is terminated or revoked.  Performed at [facility], [address], [place], [place] [address]   CBC with Differential     Status: Abnormal   Collection Time: 08/08/22 12:10 PM  Result Value Ref Range   WBC 8.4 4.0 - 10.5 K/uL   RBC 3.80 (L) 4.22 - 5.81 MIL/uL   Hemoglobin 12.4 (L) 13.0 - 17.0 g/dL   HCT 60.4 (L) 54.0 - 98.1 %   MCV 100.0 80.0 - 100.0 fL   MCH 32.6 26.0 - 34.0 pg   MCHC 32.6 30.0 - 36.0 g/dL   RDW 19.1 (H) 47.8 - 29.5 %   Platelets 264 150 - 400 K/uL   nRBC 0.0 0.0 - 0.2 %   Neutrophils Relative % 76 %   Neutro Abs 6.4 1.7 - 7.7 K/uL   Lymphocytes Relative 16 %   Lymphs Abs 1.4 0.7 - 4.0 K/uL   Monocytes Relative 8 %   Monocytes Absolute 0.6 0.1 - 1.0 K/uL   Eosinophils Relative 0 %   Eosinophils Absolute 0.0 0.0 - 0.5 K/uL   Basophils Relative 0 %   Basophils Absolute 0.0 0.0 - 0.1 K/uL   Immature Granulocytes 0 %   Abs Immature Granulocytes 0.01 0.00 - 0.07 K/uL    Comment: Performed at [facility], [address] [place], [place] [address]  Comprehensive metabolic panel     Status: Abnormal   Collection Time: 08/08/22 12:10 PM  Result Value Ref Range   Sodium 135 135 - 145 mmol/L   Potassium 3.2 (L) 3.5 - 5.1 mmol/L   Chloride 102 98 - 111 mmol/L   CO2 22 22 - 32 mmol/L   Glucose, Bld 122 (H) 70 - 99 mg/dL    Comment: Glucose reference range applies only to [name] taken after fasting for at least 8 hours.   BUN 47 (H) 8 - 23 mg/dL    Creatinine, Ser 8.65 (H) 0.61 - 1.24 mg/dL   Calcium 8.4 (L) 8.9 - 10.3 mg/dL   Total Protein 7.8 6.5 - 8.1 g/dL   Albumin 3.4 (L) 3.5 - 5.0 g/dL   AST 18 15 - 41 U/L   ALT 14 0 - 44 U/L   Alkaline Phosphatase 104 38 - 126 U/L   Total Bilirubin 1.6 (H) 0.3 - 1.2 mg/dL   GFR, Estimated 28 (L) >60 mL/min    Comment: (NOTE) Calculated using the CKD-EPI Creatinine Equation (2021)    Anion gap 11 5 - 15    Comment: Performed at [facility], [address], [place], [place] [address]  Magnesium     Status: Abnormal   Collection Time: 08/08/22 12:10 PM  Result Value Ref Range   Magnesium 2.6 (H) 1.7 - 2.4 mg/dL    Comment: Performed at [facility], [address], [place], [place] [address]  Troponin I (High Sensitivity)     Status: Abnormal   Collection Time: 08/08/22 12:10 PM  Result Value Ref Range   Troponin I (High Sensitivity) 37 (H) <18 ng/L    Comment: (NOTE) Elevated high sensitivity troponin I (hsTnI) values and significant  changes across serial measurements may suggest ACS but many other  chronic and acute conditions are known to elevate hsTnI results.  Refer to the "Links" section for chest pain algorithms and additional  guidance. Performed at [facility], [address] [place], [place] [address]   Brain natriuretic peptide  Status: Abnormal   Collection Time: 08/08/22 12:11 PM  Result Value Ref Range   B Natriuretic Peptide >4,500.0 (H) 0.0 - 100.0 pg/mL    Comment: Performed at [facility], [address], [place], [place] [address]  Troponin I (High Sensitivity)     Status: Abnormal   Collection Time: 08/08/22  1:32 PM  Result Value Ref Range   Troponin I (High Sensitivity) 31 (H) <18 ng/L    Comment: (NOTE) Elevated high sensitivity troponin I (hsTnI) values and significant  changes across serial measurements may suggest ACS but many other  chronic and acute conditions are known to elevate hsTnI results.  Refer to the "Links" section for chest pain  algorithms and additional  guidance. Performed at [facility], [address], [place], [place] [address]   Magnesium     Status: Abnormal   Collection Time: 08/08/22  1:32 PM  Result Value Ref Range   Magnesium 2.5 (H) 1.7 - 2.4 mg/dL    Comment: Performed at [facility], [address] [place], [place] [address]  Phosphorus     Status: None   Collection Time: 08/08/22  1:32 PM  Result Value Ref Range   Phosphorus 4.2 2.5 - 4.6 mg/dL    Comment: Performed at [facility], [address], [place], [place] [address]  TSH     Status: None   Collection Time: 08/08/22  1:32 PM  Result Value Ref Range   TSH 2.436 0.350 - 4.500 uIU/mL    Comment: Performed by a 3rd Generation assay with a functional sensitivity of <=0.01 uIU/mL. Performed at [facility], [address] [place], [place] [address]   Blood gas, arterial     Status: Abnormal   Collection Time: 08/08/22  5:27 PM  Result Value Ref Range   pH, Arterial 7.55 (H) 7.35 - 7.45   pCO2 arterial 31 (L) 32 - 48 mmHg   pO2, Arterial 78 (L) 83 - 108 mmHg   Bicarbonate 27.1 20.0 - 28.0 mmol/L   Acid-Base Excess 5.0 (H) 0.0 - 2.0 mmol/L   O2 Saturation 97.3 %   Patient temperature 36.8    Collection site RIGHT RADIAL    Drawn by [id number]    [place] test (pass/fail) PASS PASS    Comment: Performed at [facility], [address] [place], [place] [address]  Glucose, capillary     Status: Abnormal   Collection Time: 08/08/22  7:47 PM  Result Value Ref Range   Glucose-Capillary 178 (H) 70 - 99 mg/dL    Comment: Glucose reference range applies only to [name] taken after fasting for at least 8 hours.   Comment 1 Notify RN    Comment 2 Document in Chart   Basic metabolic panel     Status: Abnormal   Collection Time: 08/08/22  8:04 PM  Result Value Ref Range   Sodium 135 135 - 145 mmol/L   Potassium 3.5 3.5 - 5.1 mmol/L   Chloride 103 98 - 111 mmol/L   CO2 23 22 - 32 mmol/L   Glucose, Bld 183 (H) 70 - 99 mg/dL    Comment: Glucose  reference range applies only to [name] taken after fasting for at least 8 hours.   BUN 49 (H) 8 - 23 mg/dL   Creatinine, Ser 2.72 (H) 0.61 - 1.24 mg/dL   Calcium 8.0 (L) 8.9 - 10.3 mg/dL   GFR, Estimated 28 (L) >60 mL/min    Comment: (NOTE) Calculated using the CKD-EPI Creatinine Equation (2021)  Anion gap 9 5 - 15    Comment: Performed at [facility], [address] [place], [place] [address]  Blood gas, venous     Status: Abnormal   Collection Time: 08/08/22  8:04 PM  Result Value Ref Range   pH, Ven 7.53 (H) 7.25 - 7.43   pCO2, Ven 31 (L) 44 - 60 mmHg   pO2, Ven 88 (H) 32 - 45 mmHg   Bicarbonate 25.9 20.0 - 28.0 mmol/L   Acid-Base Excess 3.7 (H) 0.0 - 2.0 mmol/L   O2 Saturation 98.5 %   Patient temperature 37.2    Collection site LEFT ANTECUBITAL    Drawn by [id number]     Comment: Performed at [facility], [address], [place], [place] [address]  MRSA Next Gen by PCR, Nasal     Status: None   Collection Time: 08/08/22 10:50 PM   Specimen: Nasal Mucosa; Nasal Swab  Result Value Ref Range   MRSA by PCR Next Gen NOT DETECTED NOT DETECTED    Comment: (NOTE) The GeneXpert MRSA Assay (FDA approved for NASAL specimens only), is one component of a comprehensive MRSA colonization surveillance program. It is not intended to diagnose MRSA infection nor to guide or monitor treatment for MRSA infections. Test performance is not FDA approved in patients less than 71 years old. Performed at [facility], [address] [place], [place] [address]   Basic metabolic panel     Status: Abnormal   Collection Time: 08/09/22  3:22 AM  Result Value Ref Range   Sodium 135 135 - 145 mmol/L   Potassium 3.3 (L) 3.5 - 5.1 mmol/L   Chloride 101 98 - 111 mmol/L   CO2 23 22 - 32 mmol/L   Glucose, Bld 132 (H) 70 - 99 mg/dL    Comment: Glucose reference range applies only to [name] taken after fasting for at least 8 hours.   BUN 49 (H) 8 - 23 mg/dL   Creatinine, Ser 6.96 (H) 0.61 - 1.24 mg/dL    Calcium 8.4 (L) 8.9 - 10.3 mg/dL   GFR, Estimated 30 (L) >60 mL/min    Comment: (NOTE) Calculated using the CKD-EPI Creatinine Equation (2021)    Anion gap 11 5 - 15    Comment: Performed at [facility], [address], [place], [place] [address]  Hemoglobin A1c     Status: Abnormal   Collection Time: 08/09/22  3:22 AM  Result Value Ref Range   Hgb A1c MFr Bld 6.6 (H) 4.8 - 5.6 %    Comment: (NOTE)         Prediabetes: 5.7 - 6.4         Diabetes: >6.4         Glycemic control for adults with diabetes: <7.0    Mean Plasma Glucose 143 mg/dL    Comment: (NOTE) Performed At: [facility] [address] [place], [place] [id number] Jolene Schimke MD UV:[id number]   Glucose, capillary     Status: Abnormal   Collection Time: 08/09/22  7:32 AM  Result Value Ref Range   Glucose-Capillary 121 (H) 70 - 99 mg/dL    Comment: Glucose reference range applies only to [name] taken after fasting for at least 8 hours.  Lactic acid, plasma     Status: None   Collection Time: 08/09/22  9:23 AM  Result Value Ref Range   Lactic Acid, Venous 1.9 0.5 - 1.9 mmol/L    Comment: Performed at [facility], [address] [place],  [place] [address]  Glucose, capillary     Status: Abnormal   Collection Time: 08/09/22 11:41 AM  Result Value Ref Range   Glucose-Capillary 163 (H) 70 - 99 mg/dL    Comment: Glucose reference range applies only to [name] taken after fasting for at least 8 hours.  Glucose, capillary     Status: Abnormal   Collection Time: 08/09/22  4:00 PM  Result Value Ref Range   Glucose-Capillary 149 (H) 70 - 99 mg/dL    Comment: Glucose reference range applies only to [name] taken after fasting for at least 8 hours.  Glucose, capillary     Status: Abnormal   Collection Time: 08/09/22  9:20 PM  Result Value Ref Range   Glucose-Capillary 252 (H) 70 - 99 mg/dL    Comment: Glucose reference range applies only to [name] taken after fasting for at least 8 hours.  Basic metabolic  panel     Status: Abnormal   Collection Time: 08/10/22  5:38 AM  Result Value Ref Range   Sodium 134 (L) 135 - 145 mmol/L   Potassium 3.3 (L) 3.5 - 5.1 mmol/L   Chloride 97 (L) 98 - 111 mmol/L   CO2 23 22 - 32 mmol/L   Glucose, Bld 170 (H) 70 - 99 mg/dL    Comment: Glucose reference range applies only to [name] taken after fasting for at least 8 hours.   BUN 52 (H) 8 - 23 mg/dL   Creatinine, Ser 7.82 (H) 0.61 - 1.24 mg/dL   Calcium 8.5 (L) 8.9 - 10.3 mg/dL   GFR, Estimated 30 (L) >60 mL/min    Comment: (NOTE) Calculated using the CKD-EPI Creatinine Equation (2021)    Anion gap 14 5 - 15    Comment: Performed at [facility], [address], [place], [place] [address]  Magnesium     Status: None   Collection Time: 08/10/22  5:38 AM  Result Value Ref Range   Magnesium 2.4 1.7 - 2.4 mg/dL    Comment: Performed at [facility], [address] [place], [place] [address]  Glucose, capillary     Status: Abnormal   Collection Time: 08/10/22  7:42 AM  Result Value Ref Range   Glucose-Capillary 193 (H) 70 - 99 mg/dL    Comment: Glucose reference range applies only to [name] taken after fasting for at least 8 hours.  Lactic acid, plasma     Status: Abnormal   Collection Time: 08/10/22  9:26 AM  Result Value Ref Range   Lactic Acid, Venous 2.1 (HH) 0.5 - 1.9 mmol/L    Comment: CRITICAL RESULT CALLED TO, READ BACK BY AND VERIFIED WITH ASHLEY SHELTON 08/10/22 0954 NMN Performed at [facility], [address] [place], [place] [address]   Brain natriuretic peptide     Status: Abnormal   Collection Time: 08/10/22  9:26 AM  Result Value Ref Range   B Natriuretic Peptide 2,475.0 (H) 0.0 - 100.0 pg/mL    Comment: Performed at [facility], [address], [place], [place] [address]    ECHOCARDIOGRAM COMPLETE  Result Date: 08/09/2022    ECHOCARDIOGRAM REPORT   Patient Name:   DAJAUN GOLDRING Wanek Date of Exam: 08/09/2022 Medical Rec #:  [id number]        Height:       72.0 in Accession #:     [id number]       Weight:       181.2 lb Date of Birth:  June 25, 1955  BSA:          2.043 m Patient Age:    66 years         BP:           94/76 mmHg Patient Gender: M                HR:           112 bpm. Exam Location:  Inpatient Procedure: 2D Echo, 3D Echo, Cardiac Doppler, Color Doppler and Intracardiac            Opacification Agent Indications:    I50.40* Unspecified combined systolic (congestive) and diastolic                 (congestive) heart failure  History:        Patient has prior history of Echocardiogram examinations, most                 recent 02/08/2022. CHF and Cardiomyopathy, Abnormal ECG,                 Arrythmias:Atrial Fibrillation; Risk Factors:Current Smoker and                 Hypertension. Apical thrombus.  Sonographer:    Sheralyn Boatman RDCS Referring Phys: [id number] MICHELE M LENZE IMPRESSIONS  1. Left ventricular ejection fraction, by estimation, is <20%. The left ventricle has severely decreased function. The left ventricle demonstrates global hypokinesis. The left ventricular internal cavity size was moderately to severely dilated. Left ventricular diastolic parameters are indeterminate.  2. Right ventricular systolic function is severely reduced. The right ventricular size is moderately enlarged. There is moderately elevated pulmonary artery systolic pressure.  3. Left atrial size was moderately dilated.  4. Right atrial size was moderately dilated.  5. The mitral valve is normal in structure. Moderate to severe mitral valve regurgitation.  6. Tricuspid valve regurgitation is moderate to severe.  7. The aortic valve is tricuspid. Aortic valve regurgitation is not visualized. Aortic valve sclerosis/calcification is present, without any evidence of aortic stenosis.  8. The inferior vena cava is dilated in size with <50% respiratory variability, suggesting right atrial pressure of 15 mmHg. FINDINGS  Left Ventricle: Left ventricular ejection fraction, by estimation, is <20%. The left ventricle  has severely decreased function. The left ventricle demonstrates global hypokinesis. Definity contrast agent was given IV to delineate the left ventricular endocardial borders. The left ventricular internal cavity size was moderately to severely dilated. There is no left ventricular hypertrophy. Left ventricular diastolic parameters are indeterminate. Right Ventricle: The right ventricular size is moderately enlarged. Right vetricular wall thickness was not assessed. Right ventricular systolic function is severely reduced. There is moderately elevated pulmonary artery systolic pressure. The tricuspid regurgitant velocity is 2.96 m/s, and with an assumed right atrial pressure of 15 mmHg, the estimated right ventricular systolic pressure is 50.0 mmHg. Left Atrium: Left atrial size was moderately dilated. Right Atrium: Right atrial size was moderately dilated. Pericardium: Trivial pericardial effusion is present. Mitral Valve: The mitral valve is normal in structure. Moderate to severe mitral valve regurgitation. Tricuspid Valve: The tricuspid valve is normal in structure. Tricuspid valve regurgitation is moderate to severe. Aortic Valve: The aortic valve is tricuspid. Aortic valve regurgitation is not visualized. Aortic valve sclerosis/calcification is present, without any evidence of aortic stenosis. Pulmonic Valve: The pulmonic valve was normal in structure. Pulmonic valve regurgitation is mild. Aorta: The aortic root and ascending aorta are structurally normal, with no evidence of dilitation. Venous:  The inferior vena cava is dilated in size with less than 50% respiratory variability, suggesting right atrial pressure of 15 mmHg. IAS/Shunts: No atrial level shunt detected by color flow Doppler.  LEFT VENTRICLE PLAX 2D LVIDd:         6.00 cm   Diastology LVIDs:         5.70 cm   LV e' medial:    2.54 cm/s LV PW:         1.10 cm   LV E/e' medial:  53.9 LV IVS:        1.20 cm   LV e' lateral:   7.05 cm/s LVOT diam:      2.50 cm   LV E/e' lateral: 19.4 LV SV:         34 LV SV Index:   17 LVOT Area:     4.91 cm  RIGHT VENTRICLE            IVC RV S prime:     5.51 cm/s  IVC diam: 2.40 cm TAPSE (M-mode): 1.0 cm LEFT ATRIUM           Index        RIGHT ATRIUM           Index LA diam:      4.70 cm 2.30 cm/m   RA Area:     22.10 cm LA Vol (A2C): 85.8 ml 41.99 ml/m  RA Volume:   77.70 ml  38.03 ml/m LA Vol (A4C): 75.7 ml 37.05 ml/m  AORTIC VALVE             PULMONIC VALVE LVOT Vmax:   64.90 cm/s  PR End Diast Vel: 2.08 msec LVOT Vmean:  38.600 cm/s LVOT VTI:    0.070 m  AORTA Ao Root diam: 3.00 cm Ao Asc diam:  3.70 cm MITRAL VALVE                TRICUSPID VALVE MV Area (PHT): 4.49 cm     TR Peak grad:   35.0 mmHg MV Decel Time: 169 msec     TR Vmax:        296.00 cm/s MV E velocity: 137.00 cm/s                             SHUNTS                             Systemic VTI:  0.07 m                             Systemic Diam: 2.50 cm Dietrich Pates MD Electronically signed by Dietrich Pates MD Signature Date/Time: 08/09/2022/4:46:15 PM    Final    DG Chest Port 1 View  Result Date: 08/08/2022 CLINICAL DATA:  Shortness of breath with nonproductive cough. History of congestive heart failure. EXAM: PORTABLE CHEST 1 VIEW COMPARISON:  Radiographs 04/19/2022 and 02/08/2022.  CT 03/12/2020. FINDINGS: 1213 hours. Stable cardiomegaly, vascular congestion and right greater than left pleural effusions. Bibasilar airspace opacities appear unchanged. There is no pneumothorax. The bones appear unchanged. Multiple telemetry leads overlie the chest. IMPRESSION: Unchanged appearance of the chest with cardiomegaly, vascular congestion and bilateral pleural effusions likely due to chronic congestive heart failure. Electronically Signed   By: Carey Bullocks M.D.   On: 08/08/2022 12:27     Assessment &  Plan:  [name] [name] is a 67 y.o. male who was admitted with acute CHF exacerbation.  General surgery consulted for central line placement to obtain  CBP.  -I discussed with the patient that cardiology is recommending central line placement so that they can get adequate hemodynamic monitoring to evaluate his heart -The risk and benefits of central venous catheter insertion were discussed including but not limited to bleeding, infection, injury to surrounding structures, pneumothorax, and need for additional procedures.  After careful consideration, [name] [name] has decided to proceed with catheter insertion. -Please refer to separate procedure note for details of procedure -Postoperative chest x-ray demonstrates appropriate position of catheter without pneumothorax -Okay to use catheter -Care per primary team and cardiology  All questions were answered to the satisfaction of the patient.  -- Theophilus Kinds, DO [facility] [address] [place] [place], [place] [address] [phone] (office)

## 2022-08-11 DIAGNOSIS — E1121 Type 2 diabetes mellitus with diabetic nephropathy: Secondary | ICD-10-CM | POA: Diagnosis not present

## 2022-08-11 DIAGNOSIS — N179 Acute kidney failure, unspecified: Secondary | ICD-10-CM

## 2022-08-11 DIAGNOSIS — N189 Chronic kidney disease, unspecified: Secondary | ICD-10-CM

## 2022-08-11 DIAGNOSIS — I5023 Acute on chronic systolic (congestive) heart failure: Secondary | ICD-10-CM | POA: Diagnosis not present

## 2022-08-11 DIAGNOSIS — R570 Cardiogenic shock: Secondary | ICD-10-CM | POA: Diagnosis not present

## 2022-08-11 DIAGNOSIS — I480 Paroxysmal atrial fibrillation: Secondary | ICD-10-CM | POA: Diagnosis not present

## 2022-08-11 LAB — GLUCOSE, CAPILLARY
Glucose-Capillary: 172 mg/dL — ABNORMAL HIGH (ref 70–99)
Glucose-Capillary: 150 mg/dL — ABNORMAL HIGH (ref 70–99)
Glucose-Capillary: 158 mg/dL — ABNORMAL HIGH (ref 70–99)
Glucose-Capillary: 173 mg/dL — ABNORMAL HIGH (ref 70–99)

## 2022-08-11 LAB — COOXEMETRY PANEL
Carboxyhemoglobin: 1.3 % (ref 0.5–1.5)
Carboxyhemoglobin: 1.6 % — ABNORMAL HIGH (ref 0.5–1.5)
Methemoglobin: <0.7 % (ref 0.0–1.5)
Methemoglobin: <0.7 % (ref 0.0–1.5)
O2 Saturation: 63.7 %
O2 Saturation: 50.8 %
Total hemoglobin: 12.9 g/dL (ref 12.0–16.0)
Total hemoglobin: 12.4 g/dL (ref 12.0–16.0)

## 2022-08-11 LAB — CBC
HCT: 36.5 % — ABNORMAL LOW (ref 39.0–52.0)
Hemoglobin: 11.8 g/dL — ABNORMAL LOW (ref 13.0–17.0)
MCH: 31.7 pg (ref 26.0–34.0)
MCHC: 32.3 g/dL (ref 30.0–36.0)
MCV: 98.1 fL (ref 80.0–100.0)
Platelets: 253 10*3/uL (ref 150–400)
RBC: 3.72 MIL/uL — ABNORMAL LOW (ref 4.22–5.81)
RDW: 15.3 % (ref 11.5–15.5)
WBC: 6.6 10*3/uL (ref 4.0–10.5)
nRBC: 0 % (ref 0.0–0.2)

## 2022-08-11 LAB — BASIC METABOLIC PANEL
Anion gap: 16 — ABNORMAL HIGH (ref 5–15)
Anion gap: 9 (ref 5–15)
BUN: 47 mg/dL — ABNORMAL HIGH (ref 8–23)
BUN: 46 mg/dL — ABNORMAL HIGH (ref 8–23)
CO2: 30 mmol/L (ref 22–32)
CO2: 29 mmol/L (ref 22–32)
Calcium: 8.9 mg/dL (ref 8.9–10.3)
Calcium: 8.4 mg/dL — ABNORMAL LOW (ref 8.9–10.3)
Chloride: 93 mmol/L — ABNORMAL LOW (ref 98–111)
Chloride: 98 mmol/L (ref 98–111)
Creatinine, Ser: 2.49 mg/dL — ABNORMAL HIGH (ref 0.61–1.24)
Creatinine, Ser: 2.42 mg/dL — ABNORMAL HIGH (ref 0.61–1.24)
GFR, Estimated: 28 mL/min — ABNORMAL LOW (ref >60)
GFR, Estimated: 29 mL/min — ABNORMAL LOW (ref >60)
Glucose, Bld: 124 mg/dL — ABNORMAL HIGH (ref 70–99)
Glucose, Bld: 149 mg/dL — ABNORMAL HIGH (ref 70–99)
Potassium: 3.2 mmol/L — ABNORMAL LOW (ref 3.5–5.1)
Potassium: 3.1 mmol/L — ABNORMAL LOW (ref 3.5–5.1)
Sodium: 139 mmol/L (ref 135–145)
Sodium: 136 mmol/L (ref 135–145)

## 2022-08-11 LAB — APTT
aPTT: 56 seconds — ABNORMAL HIGH (ref 24–36)
aPTT: 67 seconds — ABNORMAL HIGH (ref 24–36)

## 2022-08-11 LAB — HEPARIN LEVEL (UNFRACTIONATED): Heparin Unfractionated: >1.1 IU/mL — ABNORMAL HIGH (ref 0.30–0.70)

## 2022-08-11 MED ORDER — GABAPENTIN 100 MG PO CAPS
100.0000 mg | ORAL_CAPSULE | Freq: Three times a day (TID) | ORAL | Status: DC
  Administered 2022-08-11 – 2022-08-19 (×24): 100 mg via ORAL
  Filled 2022-08-11 (×25): qty 1

## 2022-08-11 MED ORDER — POTASSIUM CHLORIDE CRYS ER 20 MEQ PO TBCR
40.0000 meq | EXTENDED_RELEASE_TABLET | Freq: Once | ORAL | Status: AC
  Administered 2022-08-11: 40 meq via ORAL
  Filled 2022-08-11: qty 2

## 2022-08-11 NOTE — Progress Notes (Signed)
Progress Note   Patient: [name] [name] NWG:[id number] DOB: 1955-06-17 DOA: 08/08/2022     3 DOS: the patient was seen and examined on 08/11/2022   Brief hospital course: Mr. Baugh was admitted to the hospital with the working diagnosis of heart failure exacerbation.   67 y.o. male with medical history significant of type 2 diabetes with nephropathy, hypertension, hyperlipidemia, paroxysmal atrial fibrillation on chronic Eliquis, chronic systolic heart failure and chronic kidney disease stage IIIa; who presented to the emergency department secondary to increased shortness of breath and lower extremity swelling.  Patient reports missing couple doses of his Lasix and over the last week prior to admission has noticed increased shortness of breath with exertion and also orthopnea.  Patient expressed dry coughing spells and has noticed increase in his abdominal girth.  No chest pain, no nausea, no vomiting, no dysuria, no hematuria, no focal weaknesses, no sick contacts or any other complaints. Workup in the ED with a chest x-ray demonstrating vascular congestion and chronic bilateral pleural effusion suggesting congestive heart failure; elevated BNP (>4500) and positive crackles on physical exam.  COVID PCR negative.   IV Lasix has been provided and [facility] contacted to place patient in the hospital for further evaluation and management of acute on chronic heart failure exacerbation.  Patient with low output heart failure/ cardiogenic shock.  07/23 Central line was placed and was started on milrinone infusion. Transferred [facility] from [facility].     Assessment and Plan: * Acute on chronic systolic HF (heart failure) (HCC) Echocardiogram with reduced LV systolic function with EF <20%, global hypokinesis, moderate to severe dilatation of LV cavity, no LVH, RV systolic function with severe reduction, moderate enlargement of RV cavity, RVSP 50.0, LA and RA with moderate dilatation, moderate to severe mitral  regurgitation, moderate to severe tricuspid regurgitation.  Biventricular failure. Acute on chronic core pulmonale. Pulmonary hypertension.  Low output cardiac failure, cardiogenic shock.  Urine output is 5,550 ml Systolic blood pressure 95 to 578 mmHg.  SV02 50.8   Plan to continue milrinone infusion at 0,25 mcg/kg/min. Holding loop diuretic therapy for now, his volume has improved.  Possible right heart catheterization in am.   PAF (paroxysmal atrial fibrillation) (HCC) Episodic NSVT.  Continue amiodarone IV for rate and rhythm control. Anticoagulation with heparin IV.   Acute kidney injury superimposed on chronic kidney disease (HCC) CKD stage 3b. Hypokalemia   Renal function with serum cr at 2.42 with K at 3.1 and serum bicarbonate at 29. Na 136.   Plan continue hemodynamic support with milrinone and follow up renal function in am.   Continue K correction with Kcl.   Type 2 diabetes with nephropathy (HCC) Glucose has been controlled.  Continue insulin sliding scale for glucose cover and monitoring.   Hyperlipidemia Continue statin therapy.  Tobacco use disorder -Cessation counseling provided. -Nicotine patch has been ordered.  History of DVT (deep vein thrombosis) Continue anticoagulation with IV heparin. Patient having neuropathy symptoms on his right foot. Will do a trial of gabapentin.   Hypothyroidism -TSH WNL. -Continue Synthroid.        Subjective: Patient is feeling better, dyspnea and edema have improved. Positive pain, burning sensation on his right foot at the sole forefoot.   Physical Exam: Vitals:   08/11/22 0300 08/11/22 0500 08/11/22 0700 08/11/22 1151  BP: 105/75  109/74 (!) 84/57  Pulse: 80  90   Resp: 16  (!) 21 17  Temp: 98 F (36.7 C)  98 F (36.7 C)  98 F (36.7 C)  TempSrc: Oral   Oral  SpO2: 99%  96% 97%  Weight:  79.4 kg    Height:       Neurology awake and alert ENT with no pallor Cardiovascular with S1 and S2  present, irregularly irregular with no gallops, rubs or murmurs No JVD No lower extremity edema Respiratory with no rales or wheezing Abdomen with no distention  Data Reviewed:    Family Communication: no family at the bedside   Disposition: Status is: Inpatient Remains inpatient appropriate because: IV inotropic therapy   Planned Discharge Destination: Home      Author: Coralie Keens, MD 08/11/2022 4:15 PM  For on call review [email or web address].

## 2022-08-11 NOTE — Assessment & Plan Note (Deleted)
Continue statin therapy.

## 2022-08-11 NOTE — Hospital Course (Addendum)
[name] [name] was admitted to the hospital with the working diagnosis of heart failure exacerbation.   67 y.o. male with medical history significant of type 2 diabetes with nephropathy, hypertension, hyperlipidemia, paroxysmal atrial fibrillation on chronic Eliquis, chronic systolic heart failure and chronic kidney disease stage IIIa; who presented to the emergency department secondary to increased shortness of breath and lower extremity swelling.  Patient reports missing couple doses of his Lasix and over the last week prior to admission has noticed increased shortness of breath with exertion and also orthopnea.  Patient expressed dry coughing spells and has noticed increase in his abdominal girth.  No chest pain, no nausea, no vomiting, no dysuria, no hematuria, no focal weaknesses, no sick contacts or any other complaints. Workup in the ED with a chest x-ray demonstrating vascular congestion and chronic bilateral pleural effusion suggesting congestive heart failure; elevated BNP (>4500) and positive crackles on physical exam.  COVID PCR negative.   IV Lasix has been provided and [facility] contacted to place patient in the hospital for further evaluation and management of acute on chronic heart failure exacerbation.  Patient with low output heart failure/ cardiogenic shock.  07/23 Central line was placed and was started on milrinone infusion. Transferred to [facility] from [facility].    07/26 stopped milrinone.  07/27 patient with worsening perfusion, back on milrinone. Transferred to ICU. 07/28 improved hemodynamics on milrinone 0.25  07/30 milrinone down to 0,125 mcg/ kg/min. Telemetry on sinus rhythm.

## 2022-08-11 NOTE — Progress Notes (Addendum)
Advanced Heart Failure Rounding Note  PCP-Cardiologist: Dina Rich, MD   Subjective:   Admitted from [facility] with cardiogenic shock. Lactic acid 2.1 CO-OX < 40%. Started on milrinone 0.125 mcg. Milrinone increased to 0.25 mcg. Started on IV lasix. Negative 4.5 liters.   CVP down 4-5. Weight down 6 pounds.  CO-OX 64% on Milrinone 0.25 mcg.   Feeling  better. Denies SOB  Objective:   Weight Range: 79.4 kg Body mass index is 23.74 kg/m.   Vital Signs:   Temp:  [97.6 F (36.4 C)-98.3 F (36.8 C)] 98 F (36.7 C) (07/24 0700) Pulse Rate:  [74-100] 90 (07/24 0700) Resp:  [12-29] 21 (07/24 0700) BP: (91-111)/(59-83) 109/74 (07/24 0700) SpO2:  [92 %-100 %] 96 % (07/24 0700) Weight:  [79.4 kg] 79.4 kg (07/24 0500) Last BM Date : 08/07/22  Weight change: Filed Weights   08/09/22 0424 08/10/22 0500 08/11/22 0500  Weight: 82.2 kg 82.1 kg 79.4 kg    Intake/Output:   Intake/Output Summary (Last 24 hours) at 08/11/2022 0907 Last data filed at 08/11/2022 0700 Gross per 24 hour  Intake 787.74 ml  Output 5150 ml  Net -4362.26 ml     CVP 4-5  Physical Exam    General:   No resp difficulty HEENT: Normal Neck: Supple. JVP flat . Carotids 2+ bilat; no bruits. No lymphadenopathy or thyromegaly appreciated. Cor: PMI nondisplaced. Regular rate & rhythm. No rubs, gallops or murmurs. Lungs: Clear Abdomen: Soft, nontender, nondistended. No hepatosplenomegaly. No bruits or masses. Good bowel sounds. Extremities: No cyanosis, clubbing, rash, edema. RUE PICC Neuro: Alert & orientedx3, cranial nerves grossly intact. moves all 4 extremities w/o difficulty. Affect pleasant   Telemetry   SR 80-90s   EKG    N/A   Labs    CBC Recent Labs    08/08/22 1210 08/11/22 0515  WBC 8.4 6.6  NEUTROABS 6.4  --   HGB 12.4* 11.8*  HCT 38.0* 36.5*  MCV 100.0 98.1  PLT 264 253   Basic Metabolic Panel Recent Labs    16/10/96 1332 08/08/22 2004 08/10/22 0538 08/10/22 2120  08/11/22 0515  NA  --    < > 134* 137 139  K  --    < > 3.3* 3.4* 3.2*  CL  --    < > 97* 100 93*  CO2  --    < > 23 28 30   GLUCOSE  --    < > 170* 170* 124*  BUN  --    < > 52* 48* 47*  CREATININE  --    < > 2.38* 2.59* 2.49*  CALCIUM  --    < > 8.5* 8.5* 8.9  MG 2.5*  --  2.4  --   --   PHOS 4.2  --   --   --   --    < > = values in this interval not displayed.   Liver Function Tests Recent Labs    08/08/22 1210 08/10/22 2120  AST 18 13*  ALT 14 15  ALKPHOS 104 92  BILITOT 1.6* 0.2*  PROT 7.8 6.3*  ALBUMIN 3.4* 2.7*   No results for input(s): "LIPASE", "AMYLASE" in the last 72 hours. Cardiac Enzymes No results for input(s): "CKTOTAL", "CKMB", "CKMBINDEX", "TROPONINI" in the last 72 hours.  BNP: BNP (last 3 results) Recent Labs    04/23/22 0638 08/08/22 1211 08/10/22 0926  BNP 1,260.0* >4,500.0* 2,475.0*    ProBNP (last 3 results) No results for input(s): "PROBNP" in  the last 8760 hours.   D-Dimer No results for input(s): "DDIMER" in the last 72 hours. Hemoglobin A1C Recent Labs    08/09/22 0322  HGBA1C 6.6*   Fasting Lipid Panel No results for input(s): "CHOL", "HDL", "LDLCALC", "TRIG", "CHOLHDL", "LDLDIRECT" in the last 72 hours. Thyroid Function Tests Recent Labs    08/08/22 1332  TSH 2.436    Other results:   Imaging    DG Chest Port 1 View  Result Date: 08/10/2022 CLINICAL DATA:  Dyspnea, central line placement EXAM: PORTABLE CHEST 1 VIEW COMPARISON:  Previous studies including the examination of 08/08/2022 FINDINGS: Transverse diameter of heart is increased. Central pulmonary vessels are slightly less prominent. Small to moderate bilateral pleural effusions are seen, more so on the right side. There is placement of right IJ central venous catheter with its tip in the course of superior vena cava. There is no pneumothorax. IMPRESSION: Tip of right IJ central venous catheter is seen in superior vena cava. There is no pneumothorax.  Cardiomegaly. There is decrease in pulmonary vascular congestion. Small to moderate bilateral pleural effusions, more so on the right side. Electronically Signed   By: Ernie Avena M.D.   On: 08/10/2022 14:38     Medications:     Scheduled Medications:  aspirin EC  81 mg Oral Q0600   Chlorhexidine Gluconate Cloth  6 each Topical Q0600   furosemide       insulin aspart  0-5 Units Subcutaneous QHS   insulin aspart  0-9 Units Subcutaneous TID WC   levothyroxine  137 mcg Oral q morning   magnesium oxide  400 mg Oral Daily   nicotine  14 mg Transdermal Daily   pantoprazole  40 mg Oral Daily   potassium chloride SA  40 mEq Oral Daily   rosuvastatin  20 mg Oral Daily   sodium chloride flush  3 mL Intravenous Q12H   tamsulosin  0.4 mg Oral Daily    Infusions:  sodium chloride     amiodarone 30 mg/hr (08/10/22 2049)   heparin 1,200 Units/hr (08/11/22 0628)   milrinone 0.25 mcg/kg/min (08/11/22 0456)    PRN Medications: sodium chloride, acetaminophen **OR** acetaminophen, furosemide, ondansetron **OR** ondansetron (ZOFRAN) IV, mouth rinse, oxyCODONE, sodium chloride flush    Patient Profile   67 y/o male with PMH of 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 Admitted for hypoxemic respiratory failure 2/2 acute HFrEF exacerbation and transferred to [facility] for progressive BiV systolic heart failure and low output state.   Assessment/Plan    SCAI C Cardiogenic Shock Severe biventricular failure on TTE from 08/09/22; likely exacerbated by medication non-compliance & progressive low output HF.  Initial mixed venous 33.7% at [facility], [facility] CI of 1.2 L/min/m2.  Currently on milrinone to 0.71mcg/kg/min. CO-OX now stable. Continue today then start to wean.  - Lactic acid cleared 2.1>1.    CVP 4-5. Hold IV lasix. Supp K  Renal function stable.  Not a candidate for advanced therapies at this time. RV failure and multiple comorbid conditions preclude him from LVAD or transplant. Low threshold for RHC tomorrow AM.  Paroxysmal A-fib/RBBB/NSVT Been in afib since admission, changed from po amio to amio gtt, short intermittent runs of NSVT Continue amio gtt for rate control Off apixaban; continue hep gtt.  Plan RHC 24-48 hours.  AKI on CKD stage IIIb Cr peak 2.59 and down to 2.49 today. Baseline 1.8.  Monitor BMP and urine output closely Progressive mod/severe MR and new mod/severe TR Plan for repeat TTE once stable; may benefit from TEER in the future.  Timing TEE per MD Hx of apical thrombus first seen in 2021 Most recent TTE without thrombus, he will continue on anticoagulation for atrial fibrillation anyway Hep gtt OSA/HTN Severe OSA on outpatient sleep study CPAP nightly Holding antihypertensives for low BP Non-obstructive CAD/PAD/HLD No signs of ACS Continue aspirin 81 mg daily Tobacco use disorder Encourage smoking cessation T2DM/Hypothyroidism Per primary   Improving. OOB. Check BMET and CO-OX now   Length of Stay: 3  Amy Clegg, NP  08/11/2022, 9:07 AM  Advanced Heart Failure Team Pager [phone] (M-F; 7a - 5p)  Please contact [facility] for night-coverage after hours (5p -7a ) and weekends on [email or web address]  Agree with above  Now on milrinone 0.25. Has diuresed well. Denies SOB but very weak. Repeat co-ox 51% Lactic acid cleared  General:  Weak appearing. No resp difficulty HEENT: normal Neck: supple. no JVD. Carotids 2+ bilat; no bruits. No lymphadenopathy or thryomegaly appreciated. Cor:  Regular rate & rhythm. +s3 Lungs: clear Abdomen: soft, nontender, nondistended. No hepatosplenomegaly. No bruits or masses. Good bowel sounds. Extremities: no cyanosis, clubbing, rash, edema Neuro: alert & orientedx3, cranial nerves grossly intact. moves all  4 extremities w/o difficulty. Affect pleasant  He has end-stage HF in setting of severe biventricular failure. Given renal failure and social factors including poor compliance, likely not candidate for advanced therapies. Will plan RHC tomorrow for further evaluation but suspect we may be heading toward a more palliative approach.   CRITICAL CARE Performed by: Arvilla Meres  Total critical care time: 40 minutes  Critical care time was exclusive of separately billable procedures and treating other patients.  Critical care was necessary to treat or prevent imminent or life-threatening deterioration.  Critical care was time spent personally by me (independent of midlevel providers or residents) on the following activities: development of treatment plan with patient and/or surrogate as well as nursing, discussions with consultants, evaluation of patient's response to treatment, examination of patient, obtaining history from patient or surrogate, ordering and performing treatments and interventions, ordering and review of laboratory studies, ordering and review of radiographic studies, pulse oximetry and re-evaluation of patient's condition.  Arvilla Meres, MD  6:14 PM

## 2022-08-11 NOTE — Plan of Care (Signed)

## 2022-08-11 NOTE — Progress Notes (Signed)
Mobility Specialist Progress Note:   08/11/22 1125  Mobility  Activity Ambulated with assistance in hallway  Level of Assistance Contact guard assist, steadying assist  Assistive Device  (IV Pole)  Distance Ambulated (ft) 310 ft  Activity Response Tolerated well  Mobility Referral Yes  $Mobility charge 1 Mobility  Mobility Specialist Start Time (ACUTE ONLY) 1020  Mobility Specialist Stop Time (ACUTE ONLY) 1045  Mobility Specialist Time Calculation (min) (ACUTE ONLY) 25 min    Pre Mobility: 139/91 BP  During Mobility: 101 HR , 92% SpO2 2 L  Post Mobility: 90 HR ,111/80 BP, 97% SpO2 2 L   Pt received in bed, agreeable to mobility. C/o R foot pain when ambulating, otherwise asymptomatic throughout. Pt returned to bed with call bell in hand and all needs met.   Leory Plowman  Mobility Specialist Please contact via [employer] office at [phone]

## 2022-08-11 NOTE — Progress Notes (Addendum)
ANTICOAGULATION CONSULT NOTE - Follow Up  Pharmacy Consult for Heparin (Eliquis on hold) Indication: atrial fibrillation  Allergies  Allergen Reactions   Bee Pollen Swelling    Patient Measurements: Height: 6' (182.9 cm) Weight: 79.4 kg (175 lb 0.7 oz) IBW/kg (Calculated) : 77.6 Heparin Dosing Weight: total weight  Vital Signs: Temp: 98 F (36.7 C) (07/24 0300) Temp Source: Oral (07/24 0300) BP: 105/75 (07/24 0300) Pulse Rate: 80 (07/24 0300)  Labs: Recent Labs    08/08/22 1210 08/08/22 1332 08/08/22 2004 08/09/22 0322 08/10/22 0538 08/10/22 2120 08/11/22 0515  HGB 12.4*  --   --   --   --   --  11.8*  HCT 38.0*  --   --   --   --   --  36.5*  PLT 264  --   --   --   --   --  253  APTT  --   --   --   --   --   --  56*  CREATININE 2.45*  --    < > 2.36* 2.38* 2.59*  --   TROPONINIHS 37* 31*  --   --   --   --   --    < > = values in this interval not displayed.    Estimated Creatinine Clearance: 31.2 mL/min (A) (by C-G formula based on SCr of 2.59 mg/dL (H)).   Medical History: Past Medical History:  Diagnosis Date   Anxiety    Apical mural thrombus    Ascending aorta dilatation (HCC)    Chronic HFrEF (heart failure with reduced ejection fraction) (HCC)    a. EF 15% in 2013 with cath showing normal cors b. EF 30-35% by repeat echo in 2015   CKD stage 3b, GFR 30-44 ml/min (HCC)    Cocaine use    Diabetes mellitus    History of noncompliance with medical treatment, presenting hazards to health    Hyperlipidemia    Hypertension    Mitral regurgitation    NICM (nonischemic cardiomyopathy) (HCC)    PAD (peripheral artery disease) (HCC)    PAF (paroxysmal atrial fibrillation) (HCC)     Medications:  Scheduled:   aspirin EC  81 mg Oral Q0600   Chlorhexidine Gluconate Cloth  6 each Topical Q0600   furosemide       furosemide  80 mg Intravenous Q12H   insulin aspart  0-5 Units Subcutaneous QHS   insulin aspart  0-9 Units Subcutaneous TID WC    levothyroxine  137 mcg Oral q morning   magnesium oxide  400 mg Oral Daily   nicotine  14 mg Transdermal Daily   pantoprazole  40 mg Oral Daily   potassium chloride SA  40 mEq Oral Daily   rosuvastatin  20 mg Oral Daily   sodium chloride flush  3 mL Intravenous Q12H   tamsulosin  0.4 mg Oral Daily   Infusions:   sodium chloride     amiodarone 30 mg/hr (08/10/22 2049)   heparin 1,050 Units/hr (08/10/22 2210)   milrinone 0.25 mcg/kg/min (08/11/22 0456)   PRN: sodium chloride, acetaminophen **OR** acetaminophen, furosemide, ondansetron **OR** ondansetron (ZOFRAN) IV, mouth rinse, oxyCODONE, sodium chloride flush  Assessment: 67 yo male on chronic apixaban for hx afib and LV apical thrombus. Transferred from [facility] for evaluation by advanced HF team. Pharmacy consulted to transition to heparin in case cath needed.  Last dose of apixaban was today at 09:00. Hgb 12.4, Plts 264  7/24 AM: aPTT 56  on 1050 units/hr (subtherapeutic). Per RN, no issues with the heparin infusion running continuously or s/sx of bleeding. CBC stable  Goal of Therapy:  aPTT 66-102 sec Heparin level 0.3-0.7 Monitor platelets by anticoagulation protocol: Yes   Plan:  Increase heparin infusion to 1200 units/hr Continue to monitor H&H and platelets Check aPTT in 8hrs - monitor using aPTT for now as HL will be falsely elevated due to recent apixaban Daily aPTT, HL and CBC  Arabella Merles, PharmD. Clinical Pharmacist 08/11/2022 5:50 AM

## 2022-08-11 NOTE — Assessment & Plan Note (Addendum)
Echocardiogram with reduced LV systolic function with EF <20%, global hypokinesis, moderate to severe dilatation of LV cavity, no LVH, RV systolic function with severe reduction, moderate enlargement of RV cavity, RVSP 50.0, LA and RA with moderate dilatation, moderate to severe mitral regurgitation, moderate to severe tricuspid regurgitation.  Biventricular failure. Acute on chronic core pulmonale. Pulmonary hypertension.  Low output cardiac failure, cardiogenic shock.  07/25 cardiac catheterization. PA 58/32 mean 42 PCWP 34  Cardiac output 5,3 and index 2.6 (Fick). PVR 1.5 W  Urine output is 700  ml Systolic blood pressure 97 to 161 mmHg.  SV02 down to 46 and 33.   Today patient not feeling well, cold lower extremities.   Resumed milrinone 0.125 mcg per Kg/min  Furosemide 80 mg IV  Limited pharmacologic therapy due to risk of hypotension.

## 2022-08-11 NOTE — Progress Notes (Signed)
ANTICOAGULATION CONSULT NOTE - Follow Up  Pharmacy Consult for Heparin (Eliquis on hold) Indication: atrial fibrillation  Allergies  Allergen Reactions   Bee Pollen Swelling    Patient Measurements: Height: 6' (182.9 cm) Weight: 79.4 kg (175 lb 0.7 oz) IBW/kg (Calculated) : 77.6 Heparin Dosing Weight: total weight  Vital Signs: Temp: 98 F (36.7 C) (07/24 1500) Temp Source: Oral (07/24 1500) BP: 104/67 (07/24 1500)  Labs: Recent Labs    08/10/22 2120 08/11/22 0515 08/11/22 1150 08/11/22 1722  HGB  --  11.8*  --   --   HCT  --  36.5*  --   --   PLT  --  253  --   --   APTT  --  56*  --  67*  HEPARINUNFRC  --  >1.10*  --   --   CREATININE 2.59* 2.49* 2.42*  --     Estimated Creatinine Clearance: 33.4 mL/min (A) (by C-G formula based on SCr of 2.42 mg/dL (H)).   Medical History: Past Medical History:  Diagnosis Date   Anxiety    Apical mural thrombus    Ascending aorta dilatation (HCC)    Chronic HFrEF (heart failure with reduced ejection fraction) (HCC)    a. EF 15% in 2013 with cath showing normal cors b. EF 30-35% by repeat echo in 2015   CKD stage 3b, GFR 30-44 ml/min (HCC)    Cocaine use    Diabetes mellitus    History of noncompliance with medical treatment, presenting hazards to health    Hyperlipidemia    Hypertension    Mitral regurgitation    NICM (nonischemic cardiomyopathy) (HCC)    PAD (peripheral artery disease) (HCC)    PAF (paroxysmal atrial fibrillation) (HCC)     Medications:  Scheduled:   aspirin EC  81 mg Oral Q0600   Chlorhexidine Gluconate Cloth  6 each Topical Q0600   gabapentin  100 mg Oral TID   insulin aspart  0-5 Units Subcutaneous QHS   insulin aspart  0-9 Units Subcutaneous TID WC   levothyroxine  137 mcg Oral q morning   magnesium oxide  400 mg Oral Daily   nicotine  14 mg Transdermal Daily   pantoprazole  40 mg Oral Daily   potassium chloride SA  40 mEq Oral Daily   rosuvastatin  20 mg Oral Daily   sodium chloride  flush  3 mL Intravenous Q12H   tamsulosin  0.4 mg Oral Daily   Infusions:   sodium chloride     amiodarone 30 mg/hr (08/10/22 2049)   heparin 1,200 Units/hr (08/11/22 0628)   milrinone 0.25 mcg/kg/min (08/11/22 0456)   PRN: sodium chloride, acetaminophen **OR** acetaminophen, ondansetron **OR** ondansetron (ZOFRAN) IV, mouth rinse, oxyCODONE, sodium chloride flush  Assessment: 67 yo male on chronic apixaban for hx afib and LV apical thrombus. [facility] for evaluation by advanced HF team. Pharmacy consulted to transition to heparin in case cath needed.  Last dose of apixaban was today at 09:00. Hgb 12.4, Plts 264  Repeat aPTT is therapeutic at 67 seconds but on lower end of range.  Goal of Therapy:  aPTT 66-102 sec Heparin level 0.3-0.7 Monitor platelets by anticoagulation protocol: Yes   Plan:  Increase heparin infusion to 1250 units/h Recheck aPTT and heparin level with am labs  Fredonia Highland, PharmD, BCPS, [facility] Clinical Pharmacist [phone] Please check AMION for all [facility] Pharmacy numbers 08/11/2022

## 2022-08-11 NOTE — Care Management Important Message (Signed)
Important Message  Patient Details  Name: [name] [name] MRN: [id number] Date of Birth: 02-25-1955   Medicare Important Message Given:  Yes     [name] 08/11/2022, 2:28 PM

## 2022-08-11 NOTE — Care Management Important Message (Signed)
Important Message  Patient Details  Name: [name] [name] MRN: [id number] Date of Birth: [birth year]   Medicare Important Message Given:  Yes     [name] 08/11/2022, 12:17 PM

## 2022-08-11 NOTE — Assessment & Plan Note (Addendum)
CKD stage 3b. Hypokalemia, hyponatremia.  Renal function with serum cr at 2,58, K is 3,4 and serum bicarbonate at 32. Na 140. Mg 2.3   Continue K correction with Kcl. Diuresis with furosemide 80 mg IV q12 Follow up renal function and electrolytes in am.

## 2022-08-12 ENCOUNTER — Encounter (HOSPITAL_COMMUNITY)
Admission: EM | Disposition: A | Discharge status: Home / Self Care | Payer: Self-pay | Source: Home / Self Care | Attending: Internal Medicine

## 2022-08-12 DIAGNOSIS — I480 Paroxysmal atrial fibrillation: Secondary | ICD-10-CM | POA: Diagnosis not present

## 2022-08-12 DIAGNOSIS — E1121 Type 2 diabetes mellitus with diabetic nephropathy: Secondary | ICD-10-CM | POA: Diagnosis not present

## 2022-08-12 DIAGNOSIS — I5023 Acute on chronic systolic (congestive) heart failure: Secondary | ICD-10-CM | POA: Diagnosis not present

## 2022-08-12 DIAGNOSIS — N179 Acute kidney failure, unspecified: Secondary | ICD-10-CM | POA: Diagnosis not present

## 2022-08-12 HISTORY — PX: RIGHT HEART CATH: CATH118263

## 2022-08-12 LAB — COOXEMETRY PANEL
Carboxyhemoglobin: 2.1 % — ABNORMAL HIGH (ref 0.5–1.5)
Carboxyhemoglobin: 1.5 % (ref 0.5–1.5)
Methemoglobin: <0.7 % (ref 0.0–1.5)
Methemoglobin: <0.7 % (ref 0.0–1.5)
O2 Saturation: 63.6 %
O2 Saturation: 50.8 %
Total hemoglobin: 11.9 g/dL — ABNORMAL LOW (ref 12.0–16.0)
Total hemoglobin: 12.2 g/dL (ref 12.0–16.0)

## 2022-08-12 LAB — BASIC METABOLIC PANEL
Anion gap: 11 (ref 5–15)
BUN: 38 mg/dL — ABNORMAL HIGH (ref 8–23)
Calcium: 8.5 mg/dL — ABNORMAL LOW (ref 8.9–10.3)
Chloride: 96 mmol/L — ABNORMAL LOW (ref 98–111)
Creatinine, Ser: 2.14 mg/dL — ABNORMAL HIGH (ref 0.61–1.24)
GFR, Estimated: 34 mL/min — ABNORMAL LOW (ref >60)
Glucose, Bld: 160 mg/dL — ABNORMAL HIGH (ref 70–99)
Potassium: 3.2 mmol/L — ABNORMAL LOW (ref 3.5–5.1)
Sodium: 134 mmol/L — ABNORMAL LOW (ref 135–145)

## 2022-08-12 LAB — POCT I-STAT EG7
Acid-Base Excess: 3 mmol/L — ABNORMAL HIGH (ref 0.0–2.0)
Acid-Base Excess: 4 mmol/L — ABNORMAL HIGH (ref 0.0–2.0)
Bicarbonate: 27.6 mmol/L (ref 20.0–28.0)
Bicarbonate: 29.3 mmol/L — ABNORMAL HIGH (ref 20.0–28.0)
Calcium, Ion: 1.14 mmol/L — ABNORMAL LOW (ref 1.15–1.40)
Calcium, Ion: 1.15 mmol/L (ref 1.15–1.40)
HCT: 36 % — ABNORMAL LOW (ref 39.0–52.0)
HCT: 38 % — ABNORMAL LOW (ref 39.0–52.0)
Hemoglobin: 12.2 g/dL — ABNORMAL LOW (ref 13.0–17.0)
Hemoglobin: 12.9 g/dL — ABNORMAL LOW (ref 13.0–17.0)
O2 Saturation: 62 %
O2 Saturation: 63 %
Potassium: 3.8 mmol/L (ref 3.5–5.1)
Potassium: 4 mmol/L (ref 3.5–5.1)
Sodium: 136 mmol/L (ref 135–145)
Sodium: 137 mmol/L (ref 135–145)
TCO2: 29 mmol/L (ref 22–32)
TCO2: 31 mmol/L (ref 22–32)
pCO2, Ven: 42.5 mmHg — ABNORMAL LOW (ref 44–60)
pCO2, Ven: 44.9 mmHg (ref 44–60)
pH, Ven: 7.421 (ref 7.25–7.43)
pH, Ven: 7.422 (ref 7.25–7.43)
pO2, Ven: 32 mmHg (ref 32–45)
pO2, Ven: 32 mmHg (ref 32–45)

## 2022-08-12 LAB — GLUCOSE, CAPILLARY
Glucose-Capillary: 185 mg/dL — ABNORMAL HIGH (ref 70–99)
Glucose-Capillary: 151 mg/dL — ABNORMAL HIGH (ref 70–99)
Glucose-Capillary: 239 mg/dL — ABNORMAL HIGH (ref 70–99)
Glucose-Capillary: 115 mg/dL — ABNORMAL HIGH (ref 70–99)

## 2022-08-12 LAB — CBC
HCT: 36.3 % — ABNORMAL LOW (ref 39.0–52.0)
Hemoglobin: 11.9 g/dL — ABNORMAL LOW (ref 13.0–17.0)
MCH: 32.6 pg (ref 26.0–34.0)
MCHC: 32.8 g/dL (ref 30.0–36.0)
MCV: 99.5 fL (ref 80.0–100.0)
Platelets: 232 10*3/uL (ref 150–400)
RBC: 3.65 MIL/uL — ABNORMAL LOW (ref 4.22–5.81)
RDW: 14.9 % (ref 11.5–15.5)
WBC: 7 10*3/uL (ref 4.0–10.5)
nRBC: 0 % (ref 0.0–0.2)

## 2022-08-12 LAB — APTT: aPTT: 39 seconds — ABNORMAL HIGH (ref 24–36)

## 2022-08-12 LAB — HEPARIN LEVEL (UNFRACTIONATED): Heparin Unfractionated: 0.88 IU/mL — ABNORMAL HIGH (ref 0.30–0.70)

## 2022-08-12 LAB — MAGNESIUM: Magnesium: 2.1 mg/dL (ref 1.7–2.4)

## 2022-08-12 SURGERY — RIGHT HEART CATH
Anesthesia: LOCAL

## 2022-08-12 MED ORDER — SODIUM CHLORIDE 0.9 % IV SOLN: 250.0000 mL | INTRAVENOUS | Status: DC | PRN

## 2022-08-12 MED ORDER — ONDANSETRON HCL 4 MG/2ML IJ SOLN: 4.0000 mg | Freq: Four times a day (QID) | INTRAMUSCULAR | Status: DC | PRN

## 2022-08-12 MED ORDER — DM-GUAIFENESIN ER 30-600 MG PO TB12
1.0000 | ORAL_TABLET | Freq: Two times a day (BID) | ORAL | Status: DC
  Administered 2022-08-12 – 2022-08-19 (×15): 1 via ORAL
  Filled 2022-08-12 (×15): qty 1

## 2022-08-12 MED ORDER — LABETALOL HCL 5 MG/ML IV SOLN: 10.0000 mg | INTRAVENOUS | Status: AC | PRN

## 2022-08-12 MED ORDER — SODIUM CHLORIDE 0.9 % IV SOLN: INTRAVENOUS | Status: DC

## 2022-08-12 MED ORDER — ACETAMINOPHEN 325 MG PO TABS: 650.0000 mg | ORAL_TABLET | ORAL | Status: DC | PRN

## 2022-08-12 MED ORDER — SODIUM CHLORIDE 0.9% FLUSH
3.0000 mL | Freq: Two times a day (BID) | INTRAVENOUS | Status: DC
  Administered 2022-08-14 – 2022-08-16 (×5): 3 mL via INTRAVENOUS

## 2022-08-12 MED ORDER — FUROSEMIDE 10 MG/ML IJ SOLN
80.0000 mg | Freq: Once | INTRAMUSCULAR | Status: AC
  Administered 2022-08-12: 80 mg via INTRAVENOUS
  Filled 2022-08-12: qty 8

## 2022-08-12 MED ORDER — APIXABAN 5 MG PO TABS
5.0000 mg | ORAL_TABLET | Freq: Two times a day (BID) | ORAL | Status: DC
  Administered 2022-08-12 – 2022-08-19 (×14): 5 mg via ORAL
  Filled 2022-08-12 (×14): qty 1

## 2022-08-12 MED ORDER — FUROSEMIDE 10 MG/ML IJ SOLN
80.0000 mg | Freq: Two times a day (BID) | INTRAMUSCULAR | Status: DC
  Administered 2022-08-12 – 2022-08-13 (×2): 80 mg via INTRAVENOUS
  Filled 2022-08-12 (×2): qty 8

## 2022-08-12 MED ORDER — HYDRALAZINE HCL 20 MG/ML IJ SOLN: 10.0000 mg | INTRAMUSCULAR | Status: AC | PRN

## 2022-08-12 MED ORDER — SODIUM CHLORIDE 0.9% FLUSH: 3.0000 mL | INTRAVENOUS | Status: DC | PRN

## 2022-08-12 MED ORDER — HEPARIN (PORCINE) IN NACL 1000-0.9 UT/500ML-% IV SOLN
INTRAVENOUS | Status: DC | PRN
  Administered 2022-08-12: 500 mL

## 2022-08-12 MED ORDER — MILRINONE LACTATE IN DEXTROSE 20-5 MG/100ML-% IV SOLN
0.1250 ug/kg/min | INTRAVENOUS | Status: DC
  Administered 2022-08-12: 0.125 ug/kg/min via INTRAVENOUS
  Filled 2022-08-12: qty 100

## 2022-08-12 MED ORDER — POTASSIUM CHLORIDE CRYS ER 20 MEQ PO TBCR
40.0000 meq | EXTENDED_RELEASE_TABLET | Freq: Once | ORAL | Status: AC
  Administered 2022-08-12: 40 meq via ORAL
  Filled 2022-08-12: qty 2

## 2022-08-12 MED ORDER — LIDOCAINE HCL (PF) 1 % IJ SOLN
INTRAMUSCULAR | Status: AC
  Filled 2022-08-12: qty 30

## 2022-08-12 MED ORDER — LIDOCAINE HCL (PF) 1 % IJ SOLN
INTRAMUSCULAR | Status: DC | PRN
  Administered 2022-08-12: 2 mL

## 2022-08-12 SURGICAL SUPPLY — 5 items
CATH BALLN WEDGE 5F 110CM (CATHETERS) IMPLANT
GUIDEWIRE .025 260CM (WIRE) IMPLANT
PACK CARDIAC CATHETERIZATION (CUSTOM PROCEDURE TRAY) ×2 IMPLANT
SHEATH GLIDE SLENDER 4/5FR (SHEATH) IMPLANT
TRANSDUCER W/STOPCOCK (MISCELLANEOUS) ×2 IMPLANT

## 2022-08-12 NOTE — Progress Notes (Addendum)
Advanced Heart Failure Rounding Note  PCP-Cardiologist: Dina Rich, MD   Subjective:   Admitted from [facility] with cardiogenic shock. Lactic acid 2.1 CO-OX < 40%. Started on milrinone 0.125 mcg. Milrinone increased to 0.25 mcg. Started on IV lasix. Negative 4.5 liters.   CVP 2 this morning. Weigh up 2 lbs.   CO-OX 64% on Milrinone 0.25 mcg.   Feels better, breathing improving. Eating breakfast.   Objective:   Weight Range: 80.6 kg Body mass index is 24.1 kg/m.   Vital Signs:   Temp:  [97.8 F (36.6 C)-98.6 F (37 C)] 98.2 F (36.8 C) (07/25 0401) Pulse Rate:  [82-91] 91 (07/25 0401) Resp:  [15-20] 17 (07/25 0401) BP: (84-107)/(57-67) 92/64 (07/25 0401) SpO2:  [91 %-97 %] 91 % (07/25 0401) Weight:  [80.6 kg] 80.6 kg (07/25 0401) Last BM Date : 08/07/22  Weight change: Filed Weights   08/10/22 0500 08/11/22 0500 08/12/22 0401  Weight: 82.1 kg 79.4 kg 80.6 kg    Intake/Output:   Intake/Output Summary (Last 24 hours) at 08/12/2022 0717 Last data filed at 08/12/2022 0634 Gross per 24 hour  Intake 360 ml  Output 1500 ml  Net -1140 ml     CVP 2 Physical Exam  General:  well appearing.  No respiratory difficulty HEENT: normal Neck: supple. JVD ~5 cm. Carotids 2+ bilat; no bruits. No lymphadenopathy or thyromegaly appreciated. RIJ CVC Cor: PMI nondisplaced. Regular rate & rhythm. No rubs, gallops or murmurs. Lungs: clear, diminished bases Abdomen: soft, nontender, nondistended. No hepatosplenomegaly. No bruits or masses. Good bowel sounds. Extremities: no cyanosis, clubbing, rash, edema  Neuro: alert & oriented x 3, cranial nerves grossly intact. moves all 4 extremities w/o difficulty. Affect pleasant.   Telemetry   NSR 90s (Personally reviewed)    EKG    N/A   Labs    CBC Recent Labs    08/11/22 0515  WBC 6.6  HGB 11.8*  HCT 36.5*  MCV 98.1  PLT 253   Basic Metabolic Panel Recent Labs    60/45/40 0538 08/10/22 2120 08/11/22 0515  08/11/22 1150  NA 134*   < > 139 136  K 3.3*   < > 3.2* 3.1*  CL 97*   < > 93* 98  CO2 23   < > 30 29  GLUCOSE 170*   < > 124* 149*  BUN 52*   < > 47* 46*  CREATININE 2.38*   < > 2.49* 2.42*  CALCIUM 8.5*   < > 8.9 8.4*  MG 2.4  --   --   --    < > = values in this interval not displayed.   Liver Function Tests Recent Labs    08/10/22 2120  AST 13*  ALT 15  ALKPHOS 92  BILITOT 0.2*  PROT 6.3*  ALBUMIN 2.7*   No results for input(s): "LIPASE", "AMYLASE" in the last 72 hours. Cardiac Enzymes No results for input(s): "CKTOTAL", "CKMB", "CKMBINDEX", "TROPONINI" in the last 72 hours.  BNP: BNP (last 3 results) Recent Labs    04/23/22 0638 08/08/22 1211 08/10/22 0926  BNP 1,260.0* >4,500.0* 2,475.0*    ProBNP (last 3 results) No results for input(s): "PROBNP" in the last 8760 hours.   D-Dimer No results for input(s): "DDIMER" in the last 72 hours. Hemoglobin A1C No results for input(s): "HGBA1C" in the last 72 hours.  Fasting Lipid Panel No results for input(s): "CHOL", "HDL", "LDLCALC", "TRIG", "CHOLHDL", "LDLDIRECT" in the last 72 hours. Thyroid Function Tests No  results for input(s): "TSH", "T4TOTAL", "T3FREE", "THYROIDAB" in the last 72 hours.  Invalid input(s): "FREET3"   Other results:   Imaging    No results found.   Medications:     Scheduled Medications:  aspirin EC  81 mg Oral Q0600   Chlorhexidine Gluconate Cloth  6 each Topical Q0600   dextromethorphan-guaiFENesin  1 tablet Oral BID   gabapentin  100 mg Oral TID   insulin aspart  0-5 Units Subcutaneous QHS   insulin aspart  0-9 Units Subcutaneous TID WC   levothyroxine  137 mcg Oral q morning   magnesium oxide  400 mg Oral Daily   nicotine  14 mg Transdermal Daily   pantoprazole  40 mg Oral Daily   potassium chloride SA  40 mEq Oral Daily   rosuvastatin  20 mg Oral Daily   sodium chloride flush  3 mL Intravenous Q12H   tamsulosin  0.4 mg Oral Daily    Infusions:  sodium  chloride     amiodarone 30 mg/hr (08/11/22 1946)   heparin 1,250 Units/hr (08/11/22 1957)   milrinone 0.25 mcg/kg/min (08/11/22 1945)    PRN Medications: sodium chloride, acetaminophen **OR** acetaminophen, ondansetron **OR** ondansetron (ZOFRAN) IV, mouth rinse, oxyCODONE, sodium chloride flush    Patient Profile   67 y/o male with PMH of 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 Admitted for hypoxemic respiratory failure 2/2 acute HFrEF exacerbation and transferred to [facility] for progressive BiV systolic heart failure and low output state.   Assessment/Plan   SCAI C Cardiogenic Shock Severe biventricular failure on TTE from 08/09/22; likely exacerbated by medication non-compliance & progressive low output HF.  Initial mixed venous 33.7% at [facility], Fick CI of 1.2 L/min/m2.  Decrease milrinone to 0.25>0.125 mcg/kg/min. CO-OX 64%.  - Lactic acid cleared 2.1>1.   CVP 2.Continue to hold IV lasix. CVP low but weight trending up, will reevaluate after RHC. Renal function stable.  Not a candidate for advanced therapies at this time. RV failure and multiple comorbid conditions preclude him from LVAD or transplant. For RHC this afternoon. The patient understands that risks included but are not limited to stroke (1 in 1000), death (1 in 1000), kidney failure [usually temporary] (1 in 500), bleeding (1 in 200), allergic reaction [possibly serious] (1 in 200).  The patient understands and agrees to proceed.   Paroxysmal A-fib/RBBB/NSVT Been in afib since admission, changed from po amio to amio gtt, short intermittent runs of NSVT Continue amio gtt for rate control Off apixaban; continue hep gtt.  RHC later today AKI on CKD stage IIIb Cr peak 2.59 and down to 2.49, pending today. Baseline 1.8.   Monitor BMP and urine output closely Progressive mod/severe MR and new mod/severe TR Plan for repeat TTE once stable; may benefit from TEER in the future.  Timing TEE per MD Hx of apical thrombus first seen in 2021 Most recent TTE without thrombus, he will continue on anticoagulation for atrial fibrillation anyway Hep gtt OSA/HTN Severe OSA on outpatient sleep study CPAP nightly Holding antihypertensives for low BP Non-obstructive CAD/PAD/HLD No signs of ACS Continue aspirin 81 mg daily Tobacco use disorder Encourage smoking cessation T2DM/Hypothyroidism Per primary   Length of Stay: 4  Alen Bleacher, NP  08/12/2022, 7:17 AM  Advanced Heart Failure Team Pager [phone] (  M-F; 7a - 5p)  Please contact [facility] for night-coverage after hours (5p -7a ) and weekends on [email or web address]  Patient seen and examined with the above-signed Advanced Practice Provider and/or Housestaff. I personally reviewed laboratory data, imaging studies and relevant notes. I independently examined the patient and formulated the important aspects of the plan. I have edited the note to reflect any of my changes or salient points. I have personally discussed the plan with the patient and/or family.  Remains on milrinone 0.25. Co-ox 64% CVP 2 Feeling better.   General:  Frail appearing. No resp difficulty HEENT: normal Neck: supple. no JVD. Carotids 2+ bilat; no bruits. No lymphadenopathy or thryomegaly appreciated. Cor: Irregular rate & rhythm. No rubs, gallops or murmurs. Lungs: clear Abdomen: soft, nontender, nondistended. No hepatosplenomegaly. No bruits or masses. Good bowel sounds. Extremities: no cyanosis, clubbing, rash, edema Neuro: alert & orientedx3, cranial nerves grossly intact. moves all 4 extremities w/o difficulty. Affect pleasant  Improved with inotropic support. Cut milrinone down to 0.125. Plan RHC to assess full hemodynamics. Currently not candidate for advanced therapies with CKD,  social hrudles and noncompliance.   Suspect AF may have contributed to decompensation. Continue IV amio. If doesn't convert will need TEE/DCCV. Switch heparin back to [place] after RHC.   Arvilla Meres, MD  12:42 PM

## 2022-08-12 NOTE — Progress Notes (Signed)
Mobility Specialist Progress Note:   08/12/22 1100  Oxygen Therapy  O2 Device Nasal Cannula  O2 Flow Rate (L/min) 2 L/min  Mobility  Activity Ambulated with assistance in hallway  Level of Assistance Contact guard assist, steadying assist  Assistive Device Other (Comment) (IV Pole)  Distance Ambulated (ft) 700 ft  Activity Response Tolerated well  Mobility Referral Yes  $Mobility charge 1 Mobility  Mobility Specialist Start Time (ACUTE ONLY) 1051  Mobility Specialist Stop Time (ACUTE ONLY) 1115  Mobility Specialist Time Calculation (min) (ACUTE ONLY) 24 min    Pre Mobility: 88 HR,  95% SpO2 During Mobility: 100 HR, 91% SpO2 Post Mobility:  96 HR, 94% SpO2  Pt received in bed, agreeable to mobility. C/o of mild BLE pain, otherwise asymptomatic. Pt left on EOB with call bell and RN present.  D'Vante Earlene Plater Mobility Specialist Please contact via Special educational needs teacher or Rehab office at [phone]

## 2022-08-12 NOTE — H&P (View-Only) (Signed)
Advanced Heart Failure Rounding Note  PCP-Cardiologist: Dina Rich, MD   Subjective:   Admitted from [facility] with cardiogenic shock. Lactic acid 2.1 CO-OX < 40%. Started on milrinone 0.125 mcg. Milrinone increased to 0.25 mcg. Started on IV lasix. Negative 4.5 liters.   CVP 2 this morning. Weigh up 2 lbs.   CO-OX 64% on Milrinone 0.25 mcg.   Feels better, breathing improving. Eating breakfast.   Objective:   Weight Range: 80.6 kg Body mass index is 24.1 kg/m.   Vital Signs:   Temp:  [97.8 F (36.6 C)-98.6 F (37 C)] 98.2 F (36.8 C) (07/25 0401) Pulse Rate:  [82-91] 91 (07/25 0401) Resp:  [15-20] 17 (07/25 0401) BP: (84-107)/(57-67) 92/64 (07/25 0401) SpO2:  [91 %-97 %] 91 % (07/25 0401) Weight:  [80.6 kg] 80.6 kg (07/25 0401) Last BM Date : 08/07/22  Weight change: Filed Weights   08/10/22 0500 08/11/22 0500 08/12/22 0401  Weight: 82.1 kg 79.4 kg 80.6 kg    Intake/Output:   Intake/Output Summary (Last 24 hours) at 08/12/2022 0717 Last data filed at 08/12/2022 0634 Gross per 24 hour  Intake 360 ml  Output 1500 ml  Net -1140 ml     CVP 2 Physical Exam  General:  well appearing.  No respiratory difficulty HEENT: normal Neck: supple. JVD ~5 cm. Carotids 2+ bilat; no bruits. No lymphadenopathy or thyromegaly appreciated. RIJ CVC Cor: PMI nondisplaced. Regular rate & rhythm. No rubs, gallops or murmurs. Lungs: clear, diminished bases Abdomen: soft, nontender, nondistended. No hepatosplenomegaly. No bruits or masses. Good bowel sounds. Extremities: no cyanosis, clubbing, rash, edema  Neuro: alert & oriented x 3, cranial nerves grossly intact. moves all 4 extremities w/o difficulty. Affect pleasant.   Telemetry   NSR 90s (Personally reviewed)    EKG    N/A   Labs    CBC Recent Labs    08/11/22 0515  WBC 6.6  HGB 11.8*  HCT 36.5*  MCV 98.1  PLT 253   Basic Metabolic Panel Recent Labs    09/81/19 0538 08/10/22 2120 08/11/22 0515  08/11/22 1150  NA 134*   < > 139 136  K 3.3*   < > 3.2* 3.1*  CL 97*   < > 93* 98  CO2 23   < > 30 29  GLUCOSE 170*   < > 124* 149*  BUN 52*   < > 47* 46*  CREATININE 2.38*   < > 2.49* 2.42*  CALCIUM 8.5*   < > 8.9 8.4*  MG 2.4  --   --   --    < > = values in this interval not displayed.   Liver Function Tests Recent Labs    08/10/22 2120  AST 13*  ALT 15  ALKPHOS 92  BILITOT 0.2*  PROT 6.3*  ALBUMIN 2.7*   No results for input(s): "LIPASE", "AMYLASE" in the last 72 hours. Cardiac Enzymes No results for input(s): "CKTOTAL", "CKMB", "CKMBINDEX", "TROPONINI" in the last 72 hours.  BNP: BNP (last 3 results) Recent Labs    04/23/22 0638 08/08/22 1211 08/10/22 0926  BNP 1,260.0* >4,500.0* 2,475.0*    ProBNP (last 3 results) No results for input(s): "PROBNP" in the last 8760 hours.   D-Dimer No results for input(s): "DDIMER" in the last 72 hours. Hemoglobin A1C No results for input(s): "HGBA1C" in the last 72 hours.  Fasting Lipid Panel No results for input(s): "CHOL", "HDL", "LDLCALC", "TRIG", "CHOLHDL", "LDLDIRECT" in the last 72 hours. Thyroid Function Tests No  results for input(s): "TSH", "T4TOTAL", "T3FREE", "THYROIDAB" in the last 72 hours.  Invalid input(s): "FREET3"   Other results:   Imaging    No results found.   Medications:     Scheduled Medications:  aspirin EC  81 mg Oral Q0600   Chlorhexidine Gluconate Cloth  6 each Topical Q0600   dextromethorphan-guaiFENesin  1 tablet Oral BID   gabapentin  100 mg Oral TID   insulin aspart  0-5 Units Subcutaneous QHS   insulin aspart  0-9 Units Subcutaneous TID WC   levothyroxine  137 mcg Oral q morning   magnesium oxide  400 mg Oral Daily   nicotine  14 mg Transdermal Daily   pantoprazole  40 mg Oral Daily   potassium chloride SA  40 mEq Oral Daily   rosuvastatin  20 mg Oral Daily   sodium chloride flush  3 mL Intravenous Q12H   tamsulosin  0.4 mg Oral Daily    Infusions:  sodium  chloride     amiodarone 30 mg/hr (08/11/22 1946)   heparin 1,250 Units/hr (08/11/22 1957)   milrinone 0.25 mcg/kg/min (08/11/22 1945)    PRN Medications: sodium chloride, acetaminophen **OR** acetaminophen, ondansetron **OR** ondansetron (ZOFRAN) IV, mouth rinse, oxyCODONE, sodium chloride flush    Patient Profile   67 y/o male with PMH of 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 Admitted for hypoxemic respiratory failure 2/2 acute HFrEF exacerbation and transferred to [facility] for progressive BiV systolic heart failure and low output state.   Assessment/Plan   SCAI C Cardiogenic Shock Severe biventricular failure on TTE from 08/09/22; likely exacerbated by medication non-compliance & progressive low output HF.  Initial mixed venous 33.7% at [facility], [place] CI of 1.2 L/min/m2.  Decrease milrinone to 0.25>0.125 mcg/kg/min. CO-OX 64%.  - Lactic acid cleared 2.1>1.   CVP 2.Continue to hold IV lasix. CVP low but weight trending up, will reevaluate after RHC. Renal function stable.  Not a candidate for advanced therapies at this time. RV failure and multiple comorbid conditions preclude him from LVAD or transplant. For RHC this afternoon. The patient understands that risks included but are not limited to stroke (1 in 1000), death (1 in 1000), kidney failure [usually temporary] (1 in 500), bleeding (1 in 200), allergic reaction [possibly serious] (1 in 200).  The patient understands and agrees to proceed.   Paroxysmal A-fib/RBBB/NSVT Been in afib since admission, changed from po amio to amio gtt, short intermittent runs of NSVT Continue amio gtt for rate control Off apixaban; continue hep gtt.  RHC later today AKI on CKD stage IIIb Cr peak 2.59 and down to 2.49, pending today. Baseline 1.8.   Monitor BMP and urine output closely Progressive mod/severe MR and new mod/severe TR Plan for repeat TTE once stable; may benefit from TEER in the future.  Timing TEE per MD Hx of apical thrombus first seen in 2021 Most recent TTE without thrombus, he will continue on anticoagulation for atrial fibrillation anyway Hep gtt OSA/HTN Severe OSA on outpatient sleep study CPAP nightly Holding antihypertensives for low BP Non-obstructive CAD/PAD/HLD No signs of ACS Continue aspirin 81 mg daily Tobacco use disorder Encourage smoking cessation T2DM/Hypothyroidism Per primary   Length of Stay: 4  Alen Bleacher, NP  08/12/2022, 7:17 AM  Advanced Heart Failure Team Pager [phone] (  M-F; 7a - 5p)  Please contact [facility] for night-coverage after hours (5p -7a ) and weekends on [email or web address]  Patient seen and examined with the above-signed Advanced Practice Provider and/or Housestaff. I personally reviewed laboratory data, imaging studies and relevant notes. I independently examined the patient and formulated the important aspects of the plan. I have edited the note to reflect any of my changes or salient points. I have personally discussed the plan with the patient and/or family.  Remains on milrinone 0.25. Co-ox 64% CVP 2 Feeling better.   General:  Frail appearing. No resp difficulty HEENT: normal Neck: supple. no JVD. Carotids 2+ bilat; no bruits. No lymphadenopathy or thryomegaly appreciated. Cor: Regular rate & rhythm. No rubs, gallops or murmurs. Lungs: clear Abdomen: soft, nontender, nondistended. No hepatosplenomegaly. No bruits or masses. Good bowel sounds. Extremities: no cyanosis, clubbing, rash, edema Neuro: alert & orientedx3, cranial nerves grossly intact. moves all 4 extremities w/o difficulty. Affect pleasant  Improved with inotropic support. Cut milrinone down to 0.125. Plan RHC to assess full hemodynamics. Currently not candidate for advanced therapies with CKD, social  hrudles and noncompliance.   Arvilla Meres, MD  12:42 PM

## 2022-08-12 NOTE — Progress Notes (Signed)
Progress Note   Patient: [name] [name] ZOX:[id number] DOB: 1955/04/08 DOA: 08/08/2022     4 DOS: the patient was seen and examined on 08/12/2022   Brief hospital course: Mr. Coll was admitted to the hospital with the working diagnosis of heart failure exacerbation.   67 y.o. male with medical history significant of type 2 diabetes with nephropathy, hypertension, hyperlipidemia, paroxysmal atrial fibrillation on chronic Eliquis, chronic systolic heart failure and chronic kidney disease stage IIIa; who presented to the emergency department secondary to increased shortness of breath and lower extremity swelling.  Patient reports missing couple doses of his Lasix and over the last week prior to admission has noticed increased shortness of breath with exertion and also orthopnea.  Patient expressed dry coughing spells and has noticed increase in his abdominal girth.  No chest pain, no nausea, no vomiting, no dysuria, no hematuria, no focal weaknesses, no sick contacts or any other complaints. Workup in the ED with a chest x-ray demonstrating vascular congestion and chronic bilateral pleural effusion suggesting congestive heart failure; elevated BNP (>4500) and positive crackles on physical exam.  COVID PCR negative.   IV Lasix has been provided and TR[facility] contacted to place patient in the hospital for further evaluation and management of acute on chronic heart failure exacerbation.  Patient with low output heart failure/ cardiogenic shock.  07/23 Central line was placed and was started on milrinone infusion. Transferred to [facility] from [facility].     Assessment and Plan: * Acute on chronic systolic HF (heart failure) (HCC) Echocardiogram with reduced LV systolic function with EF <20%, global hypokinesis, moderate to severe dilatation of LV cavity, no LVH, RV systolic function with severe reduction, moderate enlargement of RV cavity, RVSP 50.0, LA and RA with moderate dilatation, moderate to severe mitral  regurgitation, moderate to severe tricuspid regurgitation.  Biventricular failure. Acute on chronic core pulmonale. Pulmonary hypertension.  Low output cardiac failure, cardiogenic shock.  Urine output is 1,500 ml Systolic blood pressure 98 to 811 mmHg.  SV02 63.6  Plan to decrease milrinone infusion at 0,125 mcg/kg/min. Holding loop diuretic therapy for now.  PAF (paroxysmal atrial fibrillation) (HCC) Episodic NSVT.  Continue amiodarone IV for rate and rhythm control. Anticoagulation with heparin IV.   Acute kidney injury superimposed on chronic kidney disease (HCC) CKD stage 3b. Hypokalemia, hyponatremia.  Na 134, K 3,2, bicarbonate 27, cr 2,14   Plan continue hemodynamic support with milrinone and follow up renal function in am.   Plan for a total of 80 meq kcl divided in 2 doses.    Type 2 diabetes with nephropathy (HCC) Glucose has been controlled.  Continue insulin sliding scale for glucose cover and monitoring.   Hyperlipidemia Continue statin therapy.  Tobacco use disorder -Cessation counseling provided. -Nicotine patch has been ordered.  History of DVT (deep vein thrombosis) Continue anticoagulation with IV heparin. Neuropathy symptoms have improved with gabapentin.   Hypothyroidism Continue with levothyroxine.         Subjective: Patient with improvement in right foot paresthesias, no dyspnea, no orthopnea or PND. No edema.   Physical Exam: Vitals:   08/11/22 1927 08/11/22 2325 08/12/22 0401 08/12/22 0731  BP: 98/60 107/65 92/64 104/74  Pulse: 83 82 91 95  Resp: 17 20 17 19   Temp: 98.6 F (37 C) 97.8 F (36.6 C) 98.2 F (36.8 C)   TempSrc: Oral Oral Oral   SpO2: 91% 95% 91% 94%  Weight:   80.6 kg   Height:  Neurology awake and alert ENT with no pallor Cardiovascular with S1 and S2 present and rhythmic, no gallops or rubs, positive murmur at the right lower sternal border No JVD No peripheral edema Respiratory with no rales or  wheezing, no rhonchi Abdomen with no distention  Data Reviewed:    Family Communication: no family at the bedside   Disposition: Status is: Inpatient Remains inpatient appropriate because: heart failure with low cardiac output   Planned Discharge Destination: Home    Author: Coralie Keens, MD 08/12/2022 10:13 AM  For on call review [email or web address].

## 2022-08-12 NOTE — Interval H&P Note (Signed)
History and Physical Interval Note:  08/12/2022 12:43 PM  [name] [name]  has presented today for surgery, with the diagnosis of heart failure.  The various methods of treatment have been discussed with the patient and family. After consideration of risks, benefits and other options for treatment, the patient has consented to  Procedure(s): RIGHT HEART CATH (N/A) as a surgical intervention.  The patient's history has been reviewed, patient examined, no change in status, stable for surgery.  I have reviewed the patient's chart and labs.  Questions were answered to the patient's satisfaction.     Montavious Wierzba

## 2022-08-13 ENCOUNTER — Encounter (HOSPITAL_COMMUNITY): Payer: Self-pay | Admitting: Internal Medicine

## 2022-08-13 DIAGNOSIS — E1121 Type 2 diabetes mellitus with diabetic nephropathy: Secondary | ICD-10-CM | POA: Diagnosis not present

## 2022-08-13 DIAGNOSIS — I5023 Acute on chronic systolic (congestive) heart failure: Secondary | ICD-10-CM | POA: Diagnosis not present

## 2022-08-13 DIAGNOSIS — I480 Paroxysmal atrial fibrillation: Secondary | ICD-10-CM | POA: Diagnosis not present

## 2022-08-13 DIAGNOSIS — N179 Acute kidney failure, unspecified: Secondary | ICD-10-CM | POA: Diagnosis not present

## 2022-08-13 LAB — GLUCOSE, CAPILLARY
Glucose-Capillary: 145 mg/dL — ABNORMAL HIGH (ref 70–99)
Glucose-Capillary: 186 mg/dL — ABNORMAL HIGH (ref 70–99)
Glucose-Capillary: 166 mg/dL — ABNORMAL HIGH (ref 70–99)
Glucose-Capillary: 195 mg/dL — ABNORMAL HIGH (ref 70–99)

## 2022-08-13 LAB — BASIC METABOLIC PANEL
Anion gap: 12 (ref 5–15)
BUN: 40 mg/dL — ABNORMAL HIGH (ref 8–23)
CO2: 29 mmol/L (ref 22–32)
Calcium: 8.6 mg/dL — ABNORMAL LOW (ref 8.9–10.3)
Chloride: 93 mmol/L — ABNORMAL LOW (ref 98–111)
Creatinine, Ser: 2.17 mg/dL — ABNORMAL HIGH (ref 0.61–1.24)
GFR, Estimated: 33 mL/min — ABNORMAL LOW (ref >60)
Glucose, Bld: 172 mg/dL — ABNORMAL HIGH (ref 70–99)
Potassium: 3.7 mmol/L (ref 3.5–5.1)
Sodium: 134 mmol/L — ABNORMAL LOW (ref 135–145)

## 2022-08-13 NOTE — Progress Notes (Signed)
Mobility Specialist Progress Note:   08/13/22 1204  Mobility  Activity Ambulated with assistance in hallway  Level of Assistance Contact guard assist, steadying assist  Assistive Device  (IV Pole)  Distance Ambulated (ft) 800 ft  Activity Response Tolerated well  Mobility Referral Yes  $Mobility charge 1 Mobility  Mobility Specialist Start Time (ACUTE ONLY) 1130  Mobility Specialist Stop Time (ACUTE ONLY) 1155  Mobility Specialist Time Calculation (min) (ACUTE ONLY) 25 min   Pre Mobility: 92 HR ,  During Mobility: 102 HR   Post Mobility: 65 HR   Pt received in bed, agreeable to mobility. C/o slight RLE discomfort during session, otherwise asymptomatic throughout. Ambulated on 2 L was not able to get a good pleth. Pt returned to bed with call bell in hand and all needs met. Family present.    Leory Plowman  Mobility Specialist Please contact via [employer] office at [phone]

## 2022-08-13 NOTE — Progress Notes (Signed)
Advanced Heart Failure Rounding Note  PCP-Cardiologist: Dina Rich, MD   Subjective:   Admitted from [facility] with cardiogenic shock. Lactic acid 2.1 CO-OX < 40%. Started on milrinone 0.125 mcg. Milrinone increased to 0.25 mcg. Started on IV lasix. Negative 4.5 liters.   Underwent RHC on 7/25   On milrinone 0.125    RA = 12  RV = 52/12 PA = 58/32 (42) PCW = 34 (v = 45) Fick cardiac output/index = 5.3/2.6 PVR = 1.5 WU FA sat = 95% PA sat =62%, 63% PAPI =2.2   Remains on milrinone Diuresed well with IV lasix Weight down 15lb (?) Out -2.5L  CVP 6  Labs pending this am. Feels good. Denies CP or  SOB   Back in NSR on IV amio  Objective:   Weight Range: 73.5 kg Body mass index is 21.98 kg/m.   Vital Signs:   Temp:  [98.3 F (36.8 C)-98.4 F (36.9 C)] 98.3 F (36.8 C) (07/26 0343) Pulse Rate:  [80-95] 80 (07/26 0343) Resp:  [16-23] 16 (07/26 0343) BP: (92-104)/(61-75) 96/61 (07/26 0343) SpO2:  [93 %-98 %] 93 % (07/26 0343) Weight:  [73.5 kg] 73.5 kg (07/26 0412) Last BM Date : 08/07/22  Weight change: Filed Weights   08/11/22 0500 08/12/22 0401 08/13/22 0412  Weight: 79.4 kg 80.6 kg 73.5 kg    Intake/Output:   Intake/Output Summary (Last 24 hours) at 08/13/2022 0558 Last data filed at 08/13/2022 0344 Gross per 24 hour  Intake 1399.85 ml  Output 4225 ml  Net -2825.15 ml      Physical Exam   General:  Lying in bed  No resp difficulty HEENT: normal Neck: supple. no JVD. Carotids 2+ bilat; no bruits. No lymphadenopathy or thryomegaly appreciated. Cor: PMI nondisplaced. Regular rate & rhythm. 2/6 MR /TR Lungs: clear but decreased Abdomen: soft, nontender, nondistended. No hepatosplenomegaly. No bruits or masses. Good bowel sounds. Extremities: no cyanosis, clubbing, rash, edema Neuro: alert & orientedx3, cranial nerves grossly intact. moves all 4 extremities w/o difficulty. Affect pleasant  Telemetry   NSR 60-70s Personally reviewed   Labs     CBC Recent Labs    08/11/22 0515 08/12/22 0710 08/12/22 1304  WBC 6.6 7.0  --   HGB 11.8* 11.9* 12.9*  12.2*  HCT 36.5* 36.3* 38.0*  36.0*  MCV 98.1 99.5  --   PLT 253 232  --    Basic Metabolic Panel Recent Labs    36/64/40 1150 08/12/22 0700 08/12/22 1304  NA 136 134* 136  137  K 3.1* 3.2* 4.0  3.8  CL 98 96*  --   CO2 29 27  --   GLUCOSE 149* 160*  --   BUN 46* 38*  --   CREATININE 2.42* 2.14*  --   CALCIUM 8.4* 8.5*  --   MG  --  2.1  --    Liver Function Tests Recent Labs    08/10/22 2120  AST 13*  ALT 15  ALKPHOS 92  BILITOT 0.2*  PROT 6.3*  ALBUMIN 2.7*   No results for input(s): "LIPASE", "AMYLASE" in the last 72 hours. Cardiac Enzymes No results for input(s): "CKTOTAL", "CKMB", "CKMBINDEX", "TROPONINI" in the last 72 hours.  BNP: BNP (last 3 results) Recent Labs    04/23/22 0638 08/08/22 1211 08/10/22 0926  BNP 1,260.0* >4,500.0* 2,475.0*    ProBNP (last 3 results) No results for input(s): "PROBNP" in the last 8760 hours.   D-Dimer No results for input(s): "DDIMER" in the  last 72 hours. Hemoglobin A1C No results for input(s): "HGBA1C" in the last 72 hours.  Fasting Lipid Panel No results for input(s): "CHOL", "HDL", "LDLCALC", "TRIG", "CHOLHDL", "LDLDIRECT" in the last 72 hours. Thyroid Function Tests No results for input(s): "TSH", "T4TOTAL", "T3FREE", "THYROIDAB" in the last 72 hours.  Invalid input(s): "FREET3"   Other results:   Imaging    CARDIAC CATHETERIZATION  Result Date: 08/12/2022 Findings: On milrinone 0.125 RA = 12 RV = 52/12 PA = 58/32 (42) PCW = 34 (v = 45) Fick cardiac output/index = 5.3/2.6 PVR = 1.5 WU FA sat = 95% PA sat =62%, 63% PAPI =2.2 Assessment: 1. Elevated filling pressures with normal cardiac output on milrinone 2. Borderline PAPi Plan/Discussion: Resume IV lasix Arvilla Meres, MD 1:15 PM    Medications:     Scheduled Medications:  apixaban  5 mg Oral BID   aspirin EC  81 mg Oral Q0600    Chlorhexidine Gluconate Cloth  6 each Topical Q0600   dextromethorphan-guaiFENesin  1 tablet Oral BID   furosemide  80 mg Intravenous BID   gabapentin  100 mg Oral TID   insulin aspart  0-5 Units Subcutaneous QHS   insulin aspart  0-9 Units Subcutaneous TID WC   levothyroxine  137 mcg Oral q morning   magnesium oxide  400 mg Oral Daily   nicotine  14 mg Transdermal Daily   pantoprazole  40 mg Oral Daily   potassium chloride SA  40 mEq Oral Daily   rosuvastatin  20 mg Oral Daily   sodium chloride flush  3 mL Intravenous Q12H   sodium chloride flush  3 mL Intravenous Q12H   tamsulosin  0.4 mg Oral Daily    Infusions:  sodium chloride     sodium chloride     amiodarone 30 mg/hr (08/12/22 2014)   heparin Stopped (08/12/22 1245)   milrinone 0.125 mcg/kg/min (08/12/22 0831)    PRN Medications: sodium chloride, sodium chloride, acetaminophen **OR** acetaminophen, acetaminophen, ondansetron **OR** ondansetron (ZOFRAN) IV, ondansetron (ZOFRAN) IV, mouth rinse, oxyCODONE, sodium chloride flush, sodium chloride flush    Patient Profile   67 y/o male with PMH of 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 Admitted for hypoxemic respiratory failure 2/2 acute HFrEF exacerbation and transferred to [facility] for progressive BiV systolic heart failure and low output state.   Assessment/Plan   1. Acute on chronic systolic HF -> SCAI C Cardiogenic Shock Severe biventricular failure on TTE from 08/09/22; likely exacerbated by medication non-compliance & recurrent AF Initial mixed venous 33.7% at [facility], Fick CI of 1.2 L/min/m2.  RHC yesterday with preserved CO on milrinone. Diuresed well Stop milrinone today. One more dose of IV lasix Home in am if co-ox stable Not a candidate for advanced  therapies at this time. Due to CKD and other barriers Paroxysmal A-fib/RBBB/NSVT Back in NSR this am on amio gtt. Will continue one more day Continue Eliquis AKI on CKD stage IIIb Cr peak 2.59 and down to 2.14, pending today. Baseline 1.8.  Monitor BMP and urine output closely Progressive mod/severe MR and new mod/severe TR In setting of recurrent AF. Repeat echo as outpatient. Possible mTEER Hx of apical thrombus first seen in 2021 Most recent TTE without thrombus, he will continue on anticoagulation for atrial fibrillation anyway Back on  Eliquis OSA/HTN Severe OSA on outpatient sleep study CPAP nightly Holding antihypertensives for low BP Non-obstructive CAD/PAD/HLD No signs of ACS Continue aspirin 81 mg daily Tobacco use disorder Encourage smoking cessation T2DM/Hypothyroidism Per primary   Length of Stay: 5  Arvilla Meres, MD  08/13/2022, 5:58 AM  Advanced Heart Failure Team Pager [phone] (M-F; 7a - 5p)  Please contact [facility] for night-coverage after hours (5p -7a ) and weekends on [email or web address]

## 2022-08-13 NOTE — TOC Progression Note (Addendum)
Transition of Care [facility]) - Progression Note    Patient Details  Name: [name] [name] MRN: [id number] Date of Birth: 02-20-1955  Transition of [facility]) CM/SW Contact  Elliot Cousin, RN Phone Number: [phone] 08/13/2022, 2:47 PM  Clinical Narrative:   HF TOC CM spoke to pt at bedside at home with family and dtr. Pt declines HH. Has scale at home for daily weights. Will request meds come up from [facility] pharmacy.   PCP appt arranged for 7/31 at 940 am    Expected Discharge Plan: Home/Self Care Barriers to Discharge: Continued Medical Work up  Expected Discharge Plan and Services In-house Referral: Clinical Social Work     Living arrangements for the past 2 months: Single Family Home                    HH Arranged: Patient Refused [facility]      Social Determinants of Health (SDOH) Interventions SDOH Screenings   Food Insecurity: No Food Insecurity (08/12/2022)  Housing: Low Risk  (04/20/2022)  Transportation Needs: No Transportation Needs (08/12/2022)  Utilities: Not At Risk (08/12/2022)  Alcohol Screen: Low Risk  (04/07/2022)  Depression (PHQ2-9): High Risk (04/30/2022)  Financial Resource Strain: Medium Risk (04/07/2022)  Physical Activity: Inactive (04/07/2022)  Social Connections: Moderately Isolated (04/07/2022)  Stress: No Stress Concern Present (04/07/2022)  Tobacco Use: High Risk (08/08/2022)    Readmission Risk Interventions    08/09/2022    8:24 AM 04/20/2022   11:07 AM 02/10/2022    2:42 PM  Readmission Risk Prevention Plan  Transportation Screening Complete Complete Complete  PCP or Specialist Appt within 3-5 Days   --  HRI or Home Care Consult  Complete Complete  Social Work Consult for Recovery Care Planning/Counseling  Complete Complete  Palliative Care Screening  Not Applicable Not Applicable  Medication Review [employer]) Complete Complete Complete  HRI or Home Care Consult Complete    SW Recovery Care/Counseling Consult Complete    Palliative  Care Screening Not Applicable    Skilled Nursing Facility Not Applicable

## 2022-08-13 NOTE — Progress Notes (Signed)
Progress Note   Patient: [name] [name] [id number]:[id number] DOB: 29-May-1955 DOA: 08/08/2022     5 DOS: the patient was seen and examined on 08/13/2022   Brief hospital course: [name] [name] was admitted to the hospital with the working diagnosis of heart failure exacerbation.   67 y.o. male with medical history significant of type 2 diabetes with nephropathy, hypertension, hyperlipidemia, paroxysmal atrial fibrillation on chronic Eliquis, chronic systolic heart failure and chronic kidney disease stage IIIa; who presented to the emergency department secondary to increased shortness of breath and lower extremity swelling.  Patient reports missing couple doses of his Lasix and over the last week prior to admission has noticed increased shortness of breath with exertion and also orthopnea.  Patient expressed dry coughing spells and has noticed increase in his abdominal girth.  No chest pain, no nausea, no vomiting, no dysuria, no hematuria, no focal weaknesses, no sick contacts or any other complaints. Workup in the ED with a chest x-ray demonstrating vascular congestion and chronic bilateral pleural effusion suggesting congestive heart failure; elevated BNP (>4500) and positive crackles on physical exam.  COVID PCR negative.   IV Lasix has been provided and TRH contacted to place patient in the hospital for further evaluation and management of acute on chronic heart failure exacerbation.  Patient with low output heart failure/ cardiogenic shock.  07/23 Central line was placed and was started on milrinone infusion. Transferred to [facility] from [facility].    07/26 stopped milrinone.   Assessment and Plan: * Acute on chronic systolic HF (heart failure) (HCC) Echocardiogram with reduced LV systolic function with EF <20%, global hypokinesis, moderate to severe dilatation of LV cavity, no LVH, RV systolic function with severe reduction, moderate enlargement of RV cavity, RVSP 50.0, LA and RA with moderate dilatation,  moderate to severe mitral regurgitation, moderate to severe tricuspid regurgitation.  Biventricular failure. Acute on chronic core pulmonale. Pulmonary hypertension.  Low output cardiac failure, cardiogenic shock.  07/25 cardiac catheterization. PA 58/32 mean 42 PCWP 34  Cardiac output 5,3 and index 2.6 (Fick). PVR 1.5 W  Urine output is 3,900  ml Systolic blood pressure 90 to 366 mmHg.  SV02 56.7   Stopped milrinone.  Had furosemide 80 mg IV yesterday.  Limited pharmacologic therapy due to risk of hypotension.   PAF (paroxysmal atrial fibrillation) (HCC) Episodic NSVT.  Continue amiodarone IV for rate and rhythm control. Anticoagulation with apixaban.    Acute kidney injury superimposed on chronic kidney disease (HCC) CKD stage 3b. Hypokalemia, hyponatremia.  Renal function with serum cr at 2,17, K is 3,7 and serum bicarbonate at 29.  Na 134.  Mg 2.2   Continue K correction. Follow up renal function in am.  Avoid hypotension and nephrotoxic medications.   Type 2 diabetes with nephropathy (HCC) Glucose has been controlled.  Continue insulin sliding scale for glucose cover and monitoring.   Hyperlipidemia Continue statin therapy.  Tobacco use disorder -Cessation counseling provided. -Nicotine patch has been ordered.  History of DVT (deep vein thrombosis) Anticoagulation with apixaban.  Neuropathy symptoms have improved with gabapentin.   Hypothyroidism Continue with levothyroxine.         Subjective: Patient with no chest pain, no PND or orthopnea. He has been ambulating with assistance.   Physical Exam: Vitals:   08/13/22 0343 08/13/22 0412 08/13/22 0734 08/13/22 1114  BP: 96/61  102/66 92/68  Pulse: 80  87 90  Resp: 16  20 20   Temp: 98.3 F (36.8 C)  99 F (37.2 C)  98.6 F (37 C)  TempSrc: Oral  Oral Oral  SpO2: 93%     Weight:  73.5 kg    Height:       Neurology awake and alert ENT with mild pallor Cardiovascular with S1 and S2 present  and regular with no gallops, or rubs, no murmurs No JVD Respiratory with no rales or wheezing Abdomen with no distention  No lower extremity edema  Data Reviewed:    Family Communication: his wife is at the bedside at the time of my visit.   Disposition: Status is: Inpatient Remains inpatient appropriate because: recovering from cardiogenic shock   Planned Discharge Destination: Home     Author: Coralie Keens, MD 08/13/2022 2:32 PM  For on call review [email or web address].

## 2022-08-14 ENCOUNTER — Inpatient Hospital Stay (HOSPITAL_COMMUNITY): Payer: Medicare HMO

## 2022-08-14 DIAGNOSIS — I480 Paroxysmal atrial fibrillation: Secondary | ICD-10-CM | POA: Diagnosis not present

## 2022-08-14 DIAGNOSIS — N179 Acute kidney failure, unspecified: Secondary | ICD-10-CM | POA: Diagnosis not present

## 2022-08-14 DIAGNOSIS — E785 Hyperlipidemia, unspecified: Secondary | ICD-10-CM

## 2022-08-14 DIAGNOSIS — I5023 Acute on chronic systolic (congestive) heart failure: Secondary | ICD-10-CM | POA: Diagnosis not present

## 2022-08-14 DIAGNOSIS — E1169 Type 2 diabetes mellitus with other specified complication: Secondary | ICD-10-CM

## 2022-08-14 LAB — BASIC METABOLIC PANEL
Anion gap: 12 (ref 5–15)
BUN: 61 mg/dL — ABNORMAL HIGH (ref 8–23)
CO2: 28 mmol/L (ref 22–32)
Calcium: 8.8 mg/dL — ABNORMAL LOW (ref 8.9–10.3)
Chloride: 91 mmol/L — ABNORMAL LOW (ref 98–111)
Creatinine, Ser: 3 mg/dL — ABNORMAL HIGH (ref 0.61–1.24)
GFR, Estimated: 22 mL/min — ABNORMAL LOW (ref >60)
Glucose, Bld: 113 mg/dL — ABNORMAL HIGH (ref 70–99)
Potassium: 4.3 mmol/L (ref 3.5–5.1)
Sodium: 131 mmol/L — ABNORMAL LOW (ref 135–145)

## 2022-08-14 LAB — GLUCOSE, CAPILLARY
Glucose-Capillary: 286 mg/dL — ABNORMAL HIGH (ref 70–99)
Glucose-Capillary: 273 mg/dL — ABNORMAL HIGH (ref 70–99)
Glucose-Capillary: 166 mg/dL — ABNORMAL HIGH (ref 70–99)
Glucose-Capillary: 103 mg/dL — ABNORMAL HIGH (ref 70–99)

## 2022-08-14 LAB — COOXEMETRY PANEL
Carboxyhemoglobin: <0.3 % — ABNORMAL LOW (ref 0.5–1.5)
Carboxyhemoglobin: 1 % (ref 0.5–1.5)
Methemoglobin: 1.5 % (ref 0.0–1.5)
Methemoglobin: <0.7 % (ref 0.0–1.5)
O2 Saturation: 33.4 %
O2 Saturation: 49.2 %
Total hemoglobin: 12.7 g/dL (ref 12.0–16.0)
Total hemoglobin: 11.4 g/dL — ABNORMAL LOW (ref 12.0–16.0)

## 2022-08-14 MED ORDER — MILRINONE LACTATE IN DEXTROSE 20-5 MG/100ML-% IV SOLN
0.1250 ug/kg/min | INTRAVENOUS | Status: DC
  Administered 2022-08-14: 0.125 ug/kg/min via INTRAVENOUS
  Administered 2022-08-15 (×2): 0.25 ug/kg/min via INTRAVENOUS
  Administered 2022-08-16 – 2022-08-17 (×2): 0.125 ug/kg/min via INTRAVENOUS
  Filled 2022-08-14 (×5): qty 100

## 2022-08-14 MED ORDER — NOREPINEPHRINE 4 MG/250ML-% IV SOLN
0.0000 ug/min | INTRAVENOUS | Status: DC
  Filled 2022-08-14: qty 250

## 2022-08-14 MED ORDER — SODIUM ZIRCONIUM CYCLOSILICATE 5 G PO PACK
5.0000 g | PACK | Freq: Once | ORAL | Status: AC
  Administered 2022-08-14: 5 g via ORAL
  Filled 2022-08-14: qty 1

## 2022-08-14 MED ORDER — FUROSEMIDE 10 MG/ML IJ SOLN
80.0000 mg | Freq: Every day | INTRAMUSCULAR | Status: DC
  Administered 2022-08-14 – 2022-08-15 (×2): 80 mg via INTRAVENOUS
  Filled 2022-08-14 (×2): qty 8

## 2022-08-14 NOTE — Progress Notes (Signed)
PT Cancellation Note  Patient Details Name: NADER IPPOLITO MRN: [id number] DOB: [birth year]   Cancelled Treatment:    Reason Eval/Treat Not Completed: Fatigue limiting ability to participate. Pt sleeping soundly on multiple attempts. Woke pt up and he reports he didn't sleep at all yesterday due to anxiety and needed to sleep. Will try again tomorrow.   Angelina Ok [facility] 08/14/2022, 3:39 PM Skip Mayer PT Acute [employer] [phone]

## 2022-08-14 NOTE — Significant Event (Signed)
Rapid Response Event Note   Reason for Call :  Second set of eyes  Initial Focused Assessment:  A&O x4 male lying in bed. States he has been restful today vs yesterday but feels okay. Minimal edema present, skin warm/dry. Lungs clear/diminished. Heart tones normal.  85/60 (70) HR 50 RR 30 O2 98%  Interventions/Plan of Care:  Noted slight downtrend in BP today. HR began dipping overnight. MD paged for further orders.   Event Summary:  MD Notified: M. Arrien MD Call Time: 1622 Arrival Time: 1625 End Time: 1640  Truddie Crumble, RN

## 2022-08-14 NOTE — Progress Notes (Signed)
Progress Note   Patient: [name] [name] CBJ:[id number] DOB: May 31, 1955 DOA: 08/08/2022     6 DOS: the patient was seen and examined on 08/14/2022   Brief hospital course: [name] [name] was admitted to the hospital with the working diagnosis of heart failure exacerbation.   67 y.o. male with medical history significant of type 2 diabetes with nephropathy, hypertension, hyperlipidemia, paroxysmal atrial fibrillation on chronic Eliquis, chronic systolic heart failure and chronic kidney disease stage IIIa; who presented to the emergency department secondary to increased shortness of breath and lower extremity swelling.  Patient reports missing couple doses of his Lasix and over the last week prior to admission has noticed increased shortness of breath with exertion and also orthopnea.  Patient expressed dry coughing spells and has noticed increase in his abdominal girth.  No chest pain, no nausea, no vomiting, no dysuria, no hematuria, no focal weaknesses, no sick contacts or any other complaints. Workup in the ED with a chest x-ray demonstrating vascular congestion and chronic bilateral pleural effusion suggesting congestive heart failure; elevated BNP (>4500) and positive crackles on physical exam.  COVID PCR negative.   IV Lasix has been provided and [facility] contacted to place patient in the hospital for further evaluation and management of acute on chronic heart failure exacerbation.  Patient with low output heart failure/ cardiogenic shock.  07/23 Central line was placed and was started on milrinone infusion. Transferred [facility] from [facility].    07/26 stopped milrinone.  07/27 patient with worsening perfusion, back on milrinone.   Assessment and Plan: * Acute on chronic systolic HF (heart failure) (HCC) Echocardiogram with reduced LV systolic function with EF <20%, global hypokinesis, moderate to severe dilatation of LV cavity, no LVH, RV systolic function with severe reduction, moderate enlargement of  RV cavity, RVSP 50.0, LA and RA with moderate dilatation, moderate to severe mitral regurgitation, moderate to severe tricuspid regurgitation.  Biventricular failure. Acute on chronic core pulmonale. Pulmonary hypertension.  Low output cardiac failure, cardiogenic shock.  07/25 cardiac catheterization. PA 58/32 mean 42 PCWP 34  Cardiac output 5,3 and index 2.6 (Fick). PVR 1.5 W  Urine output is 700  ml Systolic blood pressure 97 to 160 mmHg.  SV02 down to 46 and 33.   Today patient not feeling well, cold lower extremities.   Resumed milrinone 0.125 mcg per Kg/min  Furosemide 80 mg IV  Limited pharmacologic therapy due to risk of hypotension.   PAF (paroxysmal atrial fibrillation) (HCC) Episodic NSVT.  Continue amiodarone IV for rate and rhythm control. Anticoagulation with apixaban.    Acute kidney injury superimposed on chronic kidney disease (HCC) CKD stage 3b. Hypokalemia, hyponatremia.  Patient with signs of hypoperfusion, renal function today with serum cr at 2,53 with K at 5,8 and serum bicarbonate at 27. Na 129 Mg 2.3   Add sodium zirconium for hyperkalemia. Follow up renal function in am. Furosemide 80 mg IV  Type 2 diabetes mellitus with hyperlipidemia (HCC) Uncontrolled T2DM with hyperglycemia.   Continue insulin sliding scale for glucose cover and monitoring.  On gabapentin for neuropathy. Continue with statin therapy.   Tobacco use disorder -Cessation counseling provided. -Nicotine patch has been ordered.  History of DVT (deep vein thrombosis) Anticoagulation with apixaban.   Hypothyroidism Continue with levothyroxine.         Subjective: Patient is not feeling well today, feeling very weak and deconditioned  Physical Exam: Vitals:   08/14/22 0325 08/14/22 0441 08/14/22 0745 08/14/22 1119  BP: 109/72  111/86 97/69  Pulse: (!) 56  64 (!) 52  Resp: 17  15 14   Temp: 97.7 F (36.5 C)  97.7 F (36.5 C) 98.2 F (36.8 C)  TempSrc: Oral   Oral Oral  SpO2: 97%  97% 100%  Weight:  78.8 kg    Height:        Neurology awake and alert ENT with mild pallor Cardiovascular with S1 and S2 present and rhythmic with no gallops, or rubs Moderate JVD Lower extremities are cold, with trace edema Respiratory with no rales or wheezing, no rhonchi Abdomen with no distention  Data Reviewed:    Family Communication: no family at the bedside   Disposition: Status is: Inpatient Remains inpatient appropriate because: IV inotropic support   Planned Discharge Destination: Home     Author: Coralie Keens, MD 08/14/2022 2:57 PM  For on call review [email or web address].

## 2022-08-14 NOTE — Progress Notes (Addendum)
Patient has developed hypotension, with systolic blood pressure in the 80's, his heart rate is in the 50's sinus rhythm.  He continue to have cold lower extremities and reported not feeling well.   Suspected worsening low output heart failure, developing cardiogenic shock.  Plan to add norepinephrine to milrinone and transfer to the ICU, 2H. Discussed with Dr Elberta Fortis from Cardiology.  I have called [name] [name] wife and gave her an update about patient's condition and plan to transfer to ICU. All questions were addressed.

## 2022-08-14 NOTE — Progress Notes (Signed)
Cardiology brief note  Pt seen and examined bedside Laying flat- cannot see much JVD, his O2 is >94% without oxygen Lungs- does have crackles and diminished sounds at bases Normal s1, s2, no murmurs, curently in sinus rhythm Legs are cold but no edema  Overall- appears to be chronically low output state but does not appear to be overtly volume up. Cardiogenic shock, BIV failure, but PAPi was 2.1 Afib and NSVT- currently in NSR  Recommendations:  - transduce CVP again. He may have restrictive physiology   - recheck Cox. If continues to be low- increase Milrinone to 0.25. Can add norepi for time being  - d/c IV amiodarone given bradycardia. May need higher HR to get CO. Can transition to PO amiodarone from tomorrow.  Transfer to ICU  [name] [name]

## 2022-08-15 DIAGNOSIS — I5023 Acute on chronic systolic (congestive) heart failure: Secondary | ICD-10-CM | POA: Diagnosis not present

## 2022-08-15 DIAGNOSIS — I480 Paroxysmal atrial fibrillation: Secondary | ICD-10-CM | POA: Diagnosis not present

## 2022-08-15 DIAGNOSIS — E1169 Type 2 diabetes mellitus with other specified complication: Secondary | ICD-10-CM | POA: Diagnosis not present

## 2022-08-15 DIAGNOSIS — N179 Acute kidney failure, unspecified: Secondary | ICD-10-CM | POA: Diagnosis not present

## 2022-08-15 LAB — GLUCOSE, CAPILLARY
Glucose-Capillary: 133 mg/dL — ABNORMAL HIGH (ref 70–99)
Glucose-Capillary: 242 mg/dL — ABNORMAL HIGH (ref 70–99)
Glucose-Capillary: 144 mg/dL — ABNORMAL HIGH (ref 70–99)
Glucose-Capillary: 114 mg/dL — ABNORMAL HIGH (ref 70–99)
Glucose-Capillary: 243 mg/dL — ABNORMAL HIGH (ref 70–99)

## 2022-08-15 LAB — COOXEMETRY PANEL
Carboxyhemoglobin: 1.5 % (ref 0.5–1.5)
Methemoglobin: <0.7 % (ref 0.0–1.5)
O2 Saturation: 56.7 %
Total hemoglobin: 9.6 g/dL — ABNORMAL LOW (ref 12.0–16.0)

## 2022-08-15 LAB — BASIC METABOLIC PANEL
Anion gap: 15 (ref 5–15)
BUN: 62 mg/dL — ABNORMAL HIGH (ref 8–23)
CO2: 29 mmol/L (ref 22–32)
Calcium: 8.4 mg/dL — ABNORMAL LOW (ref 8.9–10.3)
Chloride: 91 mmol/L — ABNORMAL LOW (ref 98–111)
Creatinine, Ser: 2.86 mg/dL — ABNORMAL HIGH (ref 0.61–1.24)
GFR, Estimated: 24 mL/min — ABNORMAL LOW (ref >60)
Glucose, Bld: 120 mg/dL — ABNORMAL HIGH (ref 70–99)
Potassium: 3.8 mmol/L (ref 3.5–5.1)
Sodium: 135 mmol/L (ref 135–145)

## 2022-08-15 LAB — MAGNESIUM: Magnesium: 2.4 mg/dL (ref 1.7–2.4)

## 2022-08-15 LAB — CBC
HCT: 33.3 % — ABNORMAL LOW (ref 39.0–52.0)
Hemoglobin: 10.9 g/dL — ABNORMAL LOW (ref 13.0–17.0)
MCH: 31.7 pg (ref 26.0–34.0)
MCHC: 32.7 g/dL (ref 30.0–36.0)
MCV: 96.8 fL (ref 80.0–100.0)
Platelets: 225 10*3/uL (ref 150–400)
RBC: 3.44 MIL/uL — ABNORMAL LOW (ref 4.22–5.81)
RDW: 15 % (ref 11.5–15.5)
WBC: 8 10*3/uL (ref 4.0–10.5)
nRBC: 0 % (ref 0.0–0.2)

## 2022-08-15 LAB — BASIC METABOLIC PANEL WITH GFR
Anion gap: 13 (ref 5–15)
BUN: 61 mg/dL — ABNORMAL HIGH (ref 8–23)
CO2: 28 mmol/L (ref 22–32)
Calcium: 8.6 mg/dL — ABNORMAL LOW (ref 8.9–10.3)
Chloride: 91 mmol/L — ABNORMAL LOW (ref 98–111)
Creatinine, Ser: 2.75 mg/dL — ABNORMAL HIGH (ref 0.61–1.24)
GFR, Estimated: 25 mL/min — ABNORMAL LOW (ref >60)
Glucose, Bld: 136 mg/dL — ABNORMAL HIGH (ref 70–99)
Potassium: 3.7 mmol/L (ref 3.5–5.1)
Sodium: 132 mmol/L — ABNORMAL LOW (ref 135–145)

## 2022-08-15 MED ORDER — AMIODARONE HCL 200 MG PO TABS
200.0000 mg | ORAL_TABLET | Freq: Two times a day (BID) | ORAL | Status: DC
  Administered 2022-08-15 (×2): 200 mg via ORAL
  Filled 2022-08-15 (×2): qty 1

## 2022-08-15 MED ORDER — FUROSEMIDE 10 MG/ML IJ SOLN
80.0000 mg | Freq: Two times a day (BID) | INTRAMUSCULAR | Status: DC
  Administered 2022-08-15 – 2022-08-17 (×4): 80 mg via INTRAVENOUS
  Filled 2022-08-15 (×4): qty 8

## 2022-08-15 MED ORDER — POTASSIUM CHLORIDE CRYS ER 20 MEQ PO TBCR
30.0000 meq | EXTENDED_RELEASE_TABLET | Freq: Once | ORAL | Status: AC
  Administered 2022-08-15: 30 meq via ORAL
  Filled 2022-08-15: qty 1

## 2022-08-15 MED ORDER — FUROSEMIDE 10 MG/ML IJ SOLN: 80.0000 mg | Freq: Once | INTRAMUSCULAR | Status: DC

## 2022-08-15 NOTE — Plan of Care (Signed)
  Problem: Education: Goal: Ability to demonstrate management of disease process will improve Outcome: Progressing Goal: Ability to verbalize understanding of medication therapies will improve Outcome: Progressing Goal: Individualized Educational Video(s) Outcome: Progressing   Problem: Activity: Goal: Capacity to carry out activities will improve Outcome: Progressing   Problem: Cardiac: Goal: Ability to achieve and maintain adequate cardiopulmonary perfusion will improve Outcome: Progressing   Problem: Education: Goal: Knowledge of General Education information will improve Description: Including pain rating scale, medication(s)/side effects and non-pharmacologic comfort measures Outcome: Progressing   Problem: Health Behavior/Discharge Planning: Goal: Ability to manage health-related needs will improve Outcome: Progressing   Problem: Clinical Measurements: Goal: Ability to maintain clinical measurements within normal limits will improve Outcome: Progressing Goal: Will remain free from infection Outcome: Progressing Goal: Diagnostic test results will improve Outcome: Progressing Goal: Respiratory complications will improve Outcome: Progressing Goal: Cardiovascular complication will be avoided Outcome: Progressing   Problem: Activity: Goal: Risk for activity intolerance will decrease Outcome: Progressing   Problem: Nutrition: Goal: Adequate nutrition will be maintained Outcome: Progressing   Problem: Coping: Goal: Level of anxiety will decrease Outcome: Progressing   Problem: Elimination: Goal: Will not experience complications related to bowel motility Outcome: Progressing Goal: Will not experience complications related to urinary retention Outcome: Progressing   Problem: Pain Managment: Goal: General experience of comfort will improve Outcome: Progressing   Problem: Safety: Goal: Ability to remain free from injury will improve Outcome: Progressing    Problem: Skin Integrity: Goal: Risk for impaired skin integrity will decrease Outcome: Progressing   Problem: Education: Goal: Ability to describe self-care measures that may prevent or decrease complications (Diabetes Survival Skills Education) will improve Outcome: Progressing Goal: Individualized Educational Video(s) Outcome: Progressing   Problem: Coping: Goal: Ability to adjust to condition or change in health will improve Outcome: Progressing   Problem: Fluid Volume: Goal: Ability to maintain a balanced intake and output will improve Outcome: Progressing   Problem: Health Behavior/Discharge Planning: Goal: Ability to identify and utilize available resources and services will improve Outcome: Progressing Goal: Ability to manage health-related needs will improve Outcome: Progressing   Problem: Metabolic: Goal: Ability to maintain appropriate glucose levels will improve Outcome: Progressing   Problem: Nutritional: Goal: Maintenance of adequate nutrition will improve Outcome: Progressing Goal: Progress toward achieving an optimal weight will improve Outcome: Progressing   Problem: Skin Integrity: Goal: Risk for impaired skin integrity will decrease Outcome: Progressing   Problem: Tissue Perfusion: Goal: Adequacy of tissue perfusion will improve Outcome: Progressing   Problem: Education: Goal: Understanding of CV disease, CV risk reduction, and recovery process will improve Outcome: Progressing Goal: Individualized Educational Video(s) Outcome: Progressing   Problem: Activity: Goal: Ability to return to baseline activity level will improve Outcome: Progressing   Problem: Cardiovascular: Goal: Ability to achieve and maintain adequate cardiovascular perfusion will improve Outcome: Progressing Goal: Vascular access site(s) Level 0-1 will be maintained Outcome: Progressing   Problem: Health Behavior/Discharge Planning: Goal: Ability to safely manage  health-related needs after discharge will improve Outcome: Progressing   

## 2022-08-15 NOTE — Progress Notes (Signed)
Advanced Heart Failure Rounding Note  PCP-Cardiologist: Dina Rich, MD   Subjective:    [facility].  Right heart catheterization as below.  Feeling poor overnight did not sleep well.  Creatinine rising.    RA = 12  RV = 52/12 PA = 58/32 (42) PCW = 34 (v = 45) Fick cardiac output/index = 5.3/2.6 PVR = 1.5 WU FA sat = 95% PA sat =62%, 63% PAPI =2.2  Net -9.1 L  Objective:   Weight Range: 77.5 kg Body mass index is 23.17 kg/m.   Vital Signs:   Temp:  [97.6 F (36.4 C)-98.2 F (36.8 C)] 97.7 F (36.5 C) (07/28 0700) Pulse Rate:  [46-88] 71 (07/28 0630) Resp:  [0-34] 14 (07/28 0630) BP: (85-119)/(58-82) 101/61 (07/28 0630) SpO2:  [83 %-100 %] 91 % (07/28 0630) Weight:  [77.5 kg] 77.5 kg (07/28 0500) Last BM Date : 08/07/22  Weight change: Filed Weights   08/13/22 0412 08/14/22 0441 08/15/22 0500  Weight: 73.5 kg 78.8 kg 77.5 kg    Intake/Output:   Intake/Output Summary (Last 24 hours) at 08/15/2022 0746 Last data filed at 08/15/2022 0700 Gross per 24 hour  Intake 634.14 ml  Output 1050 ml  Net -415.86 ml      Physical Exam   GEN: Well nourished, well developed, in no acute distress  HEENT: normal  Neck: no JVD, carotid bruits, or masses Cardiac: RRR; 2 out of 6 MR/TR murmur Respiratory:  clear to auscultation bilaterally, normal work of breathing GI: soft, nontender, nondistended, + BS MS: no deformity or atrophy  Skin: warm and dry Neuro:  Strength and sensation are intact Psych: euthymic mood, full affect    Telemetry   Sinus rhythm-personally reviewed  Labs    CBC Recent Labs    08/14/22 0330 08/15/22 0445  WBC 7.7 8.0  HGB 12.2* 10.9*  HCT 38.4* 33.3*  MCV 98.0 96.8  PLT 238 225   Basic Metabolic Panel Recent Labs    82/95/62 0330 08/14/22 2002 08/15/22 0445  NA 129* 131* 132*  K 5.8* 4.3 3.7  CL 91* 91* 91*  CO2 27 28 28   GLUCOSE 274* 113* 136*  BUN 50* 61* 61*   CREATININE 2.53* 3.00* 2.75*  CALCIUM 8.8* 8.8* 8.6*  MG 2.3  --  2.4   Liver Function Tests No results for input(s): "AST", "ALT", "ALKPHOS", "BILITOT", "PROT", "ALBUMIN" in the last 72 hours.  No results for input(s): "LIPASE", "AMYLASE" in the last 72 hours. Cardiac Enzymes No results for input(s): "CKTOTAL", "CKMB", "CKMBINDEX", "TROPONINI" in the last 72 hours.  BNP: BNP (last 3 results) Recent Labs    04/23/22 0638 08/08/22 1211 08/10/22 0926  BNP 1,260.0* >4,500.0* 2,475.0*    ProBNP (last 3 results) No results for input(s): "PROBNP" in the last 8760 hours.   D-Dimer No results for input(s): "DDIMER" in the last 72 hours. Hemoglobin A1C No results for input(s): "HGBA1C" in the last 72 hours.  Fasting Lipid Panel No results for input(s): "CHOL", "HDL", "LDLCALC", "TRIG", "CHOLHDL", "LDLDIRECT" in the last 72 hours. Thyroid Function Tests No results for input(s): "TSH", "T4TOTAL", "T3FREE", "THYROIDAB" in the last 72 hours.  Invalid input(s): "FREET3"   Other results:   Imaging    DG CHEST PORT 1 VIEW  Result Date: 08/14/2022 CLINICAL DATA:  Dyspnea EXAM: PORTABLE CHEST 1 VIEW COMPARISON:  08/10/2022 FINDINGS: 2 frontal views of the chest demonstrate right internal jugular catheter tip overlying superior vena cava. The  cardiac silhouette is stable. There are bibasilar veiling opacities, right greater than left, consistent with pleural effusion and/or consolidation. No pneumothorax. No acute bony abnormality. IMPRESSION: 1. Stable bibasilar veiling opacities, right greater than left, consistent with consolidation and/or effusion. Electronically Signed   By: Sharlet Salina M.D.   On: 08/14/2022 19:59     Medications:     Scheduled Medications:  apixaban  5 mg Oral BID   aspirin EC  81 mg Oral Q0600   Chlorhexidine Gluconate Cloth  6 each Topical Q0600   dextromethorphan-guaiFENesin  1 tablet Oral BID   furosemide  80 mg Intravenous Daily   gabapentin   100 mg Oral TID   insulin aspart  0-5 Units Subcutaneous QHS   insulin aspart  0-9 Units Subcutaneous TID WC   levothyroxine  137 mcg Oral q morning   magnesium oxide  400 mg Oral Daily   nicotine  14 mg Transdermal Daily   pantoprazole  40 mg Oral Daily   rosuvastatin  20 mg Oral Daily   sodium chloride flush  3 mL Intravenous Q12H   sodium chloride flush  3 mL Intravenous Q12H   tamsulosin  0.4 mg Oral Daily    Infusions:  sodium chloride     sodium chloride     milrinone 0.25 mcg/kg/min (08/15/22 0700)   norepinephrine (LEVOPHED) Adult infusion      PRN Medications: sodium chloride, sodium chloride, acetaminophen **OR** acetaminophen, acetaminophen, ondansetron **OR** ondansetron (ZOFRAN) IV, ondansetron (ZOFRAN) IV, mouth rinse, oxyCODONE, sodium chloride flush, sodium chloride flush    Patient Profile   67 y/o male with PMH of 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 Admitted for hypoxemic respiratory failure 2/2 acute HFrEF exacerbation and transferred to [facility] for progressive BiV systolic heart failure and low output state.   Assessment/Plan   1.  Acute on chronic systolic heart failure: Presented in cardiogenic shock.  Has severe biventricular failure.  Milrinone was stopped yesterday.  Choloxin reduced and creatinine worsened.  [name] [name] restart milrinone today.  Continue current Lasix.  2.  Paroxysmal atrial fibrillation: Back in sinus rhythm.  Continue IV amiodarone and Eliquis.  3.  Acute renal failure on CKD stage IIIb.  Creatinine back up today.  Restarting milrinone.  4.  Moderate to severe mitral and tricuspid regurgitation: In the setting of recurrent atrial fibrillation and heart failure.  Possible mTEER.  5.  Apical thrombus: First seen in 2021.  Continue  Eliquis.  6.  Obstructive sleep apnea: CPAP compliance encouraged  7.  Nonobstructive coronary artery disease: Continue aspirin  8.  Tobacco abuse: Smoking cessation encouraged    Length of Stay: 7  Geoff Dacanay Jorja Loa, MD  08/15/2022, 7:46 AM  Advanced Heart Failure Team Pager [phone] (M-F; 7a - 5p)  Please contact [facility] for night-coverage after hours (5p -7a ) and weekends on [email or web address]

## 2022-08-15 NOTE — Progress Notes (Signed)
Advanced Heart Failure Rounding Note  PCP-Cardiologist: Dina Rich, MD   Subjective:    Admitted to the hospital with cardiogenic shock.  Milrinone restarted yesterday.  Patient became hypotensive and bradycardic yesterday.  Amiodarone was stopped and he was moved to the ICU.  Creatinine was elevated at 3.  Received IV Lasix and increase milrinone with stopping IV amiodarone.  Kidney function improving.    RA = 12  RV = 52/12 PA = 58/32 (42) PCW = 34 (v = 45) Fick cardiac output/index = 5.3/2.6 PVR = 1.5 WU FA sat = 95% PA sat =62%, 63% PAPI =2.2  Net -9.5 L CVP 18  Objective:   Weight Range: 77.5 kg Body mass index is 23.17 kg/m.   Vital Signs:   Temp:  [97.6 F (36.4 C)-98.2 F (36.8 C)] 97.7 F (36.5 C) (07/28 0700) Pulse Rate:  [46-88] 71 (07/28 0630) Resp:  [0-34] 14 (07/28 0630) BP: (85-119)/(58-82) 101/61 (07/28 0630) SpO2:  [83 %-100 %] 91 % (07/28 0630) Weight:  [77.5 kg] 77.5 kg (07/28 0500) Last BM Date : 08/07/22  Weight change: Filed Weights   08/13/22 0412 08/14/22 0441 08/15/22 0500  Weight: 73.5 kg 78.8 kg 77.5 kg    Intake/Output:   Intake/Output Summary (Last 24 hours) at 08/15/2022 0813 Last data filed at 08/15/2022 0700 Gross per 24 hour  Intake 634.14 ml  Output 1050 ml  Net -415.86 ml      Physical Exam   GEN: Well nourished, well developed, in no acute distress  HEENT: normal  Neck: no JVD, carotid bruits, or masses Cardiac: RRR; 2/6 MR/TR murmur Respiratory:  clear to auscultation bilaterally, normal work of breathing GI: soft, nontender, nondistended, + BS MS: no deformity or atrophy  Skin: warm and dry Neuro:  Strength and sensation are intact Psych: euthymic mood, full affect    Telemetry   Sinus rhythm with ventricular ectopy-personally reviewed  Labs    CBC Recent Labs    08/14/22 0330 08/15/22 0445  WBC 7.7 8.0  HGB 12.2* 10.9*  HCT 38.4* 33.3*  MCV 98.0 96.8  PLT 238 225   Basic Metabolic  Panel Recent Labs    08/14/22 0330 08/14/22 2002 08/15/22 0445  NA 129* 131* 132*  K 5.8* 4.3 3.7  CL 91* 91* 91*  CO2 27 28 28   GLUCOSE 274* 113* 136*  BUN 50* 61* 61*  CREATININE 2.53* 3.00* 2.75*  CALCIUM 8.8* 8.8* 8.6*  MG 2.3  --  2.4   Liver Function Tests No results for input(s): "AST", "ALT", "ALKPHOS", "BILITOT", "PROT", "ALBUMIN" in the last 72 hours.  No results for input(s): "LIPASE", "AMYLASE" in the last 72 hours. Cardiac Enzymes No results for input(s): "CKTOTAL", "CKMB", "CKMBINDEX", "TROPONINI" in the last 72 hours.  BNP: BNP (last 3 results) Recent Labs    04/23/22 0638 08/08/22 1211 08/10/22 0926  BNP 1,260.0* >4,500.0* 2,475.0*    ProBNP (last 3 results) No results for input(s): "PROBNP" in the last 8760 hours.   D-Dimer No results for input(s): "DDIMER" in the last 72 hours. Hemoglobin A1C No results for input(s): "HGBA1C" in the last 72 hours.  Fasting Lipid Panel No results for input(s): "CHOL", "HDL", "LDLCALC", "TRIG", "CHOLHDL", "LDLDIRECT" in the last 72 hours. Thyroid Function Tests No results for input(s): "TSH", "T4TOTAL", "T3FREE", "THYROIDAB" in the last 72 hours.  Invalid input(s): "FREET3"   Other results:   Imaging    DG CHEST PORT 1 VIEW  Result Date: 08/14/2022 CLINICAL DATA:  Dyspnea EXAM: PORTABLE CHEST 1 VIEW COMPARISON:  08/10/2022 FINDINGS: 2 frontal views of the chest demonstrate right internal jugular catheter tip overlying superior vena cava. The cardiac silhouette is stable. There are bibasilar veiling opacities, right greater than left, consistent with pleural effusion and/or consolidation. No pneumothorax. No acute bony abnormality. IMPRESSION: 1. Stable bibasilar veiling opacities, right greater than left, consistent with consolidation and/or effusion. Electronically Signed   By: Sharlet Salina M.D.   On: 08/14/2022 19:59     Medications:     Scheduled Medications:  amiodarone  200 mg Oral BID    apixaban  5 mg Oral BID   aspirin EC  81 mg Oral Q0600   Chlorhexidine Gluconate Cloth  6 each Topical Q0600   dextromethorphan-guaiFENesin  1 tablet Oral BID   furosemide  80 mg Intravenous Daily   furosemide  80 mg Intravenous Once   gabapentin  100 mg Oral TID   insulin aspart  0-5 Units Subcutaneous QHS   insulin aspart  0-9 Units Subcutaneous TID WC   levothyroxine  137 mcg Oral q morning   magnesium oxide  400 mg Oral Daily   nicotine  14 mg Transdermal Daily   pantoprazole  40 mg Oral Daily   potassium chloride  30 mEq Oral Once   potassium chloride  30 mEq Oral Once   rosuvastatin  20 mg Oral Daily   sodium chloride flush  3 mL Intravenous Q12H   sodium chloride flush  3 mL Intravenous Q12H   tamsulosin  0.4 mg Oral Daily    Infusions:  sodium chloride     sodium chloride     milrinone 0.25 mcg/kg/min (08/15/22 0700)   norepinephrine (LEVOPHED) Adult infusion      PRN Medications: sodium chloride, sodium chloride, acetaminophen **OR** acetaminophen, acetaminophen, ondansetron **OR** ondansetron (ZOFRAN) IV, ondansetron (ZOFRAN) IV, mouth rinse, oxyCODONE, sodium chloride flush, sodium chloride flush    Patient Profile   67 y/o male with PMH of 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 Admitted for hypoxemic respiratory failure 2/2 acute HFrEF exacerbation and transferred to [facility] for progressive BiV systolic heart failure and low output state.   Assessment/Plan   1.  Acute on chronic systolic heart failure: Presented to the hospital in cardiogenic shock.  Has severe biventricular failure.  Milrinone was restarted yesterday.  Patient became hypotensive and bradycardic and was moved to the ICU.  Milrinone was increased.  His kidney function is improving on higher dose  milrinone.  Jhayla Podgorski restart p.o. amiodarone.  CVP elevated at 18.  Kadesia Robel restart IV Lasix and potassium.  2.  Paroxysmal atrial fibrillation: Back in sinus rhythm.  Continue Eliquis.  Starting amiodarone 200 mg twice daily.  3.  Acute renal failure on CKD stage IIIb: Creatinine 3 yesterday.  Improving with milrinone.  Continue diuresis.  4.  Moderate to severe mitral and tricuspid regurgitation: possible mTEER.  5.  Apical thrombus: First seen in 2021.  On Eliquis.  6.  Obstructive sleep apnea: CPAP compliance encouraged  7.  Nonobstructive coronary artery disease: Continue aspirin  8.  Tobacco abuse: Smoking cessation encouraged   Length of Stay: 7  Lesbia Ottaway Jorja Loa, MD  08/15/2022, 8:13 AM  Advanced Heart Failure Team Pager [phone] (M-F; 7a - 5p)  Please contact [facility] for night-coverage  after hours (5p -7a ) and weekends on [email or web address]

## 2022-08-15 NOTE — Progress Notes (Signed)
Progress Note   Patient: [name] [name] NWG:[id number] DOB: 1955/11/01 DOA: 08/08/2022     7 DOS: the patient was seen and examined on 08/15/2022   Brief hospital course: [name] [name] was admitted to the hospital with the working diagnosis of heart failure exacerbation.   67 y.o. male with medical history significant of type 2 diabetes with nephropathy, hypertension, hyperlipidemia, paroxysmal atrial fibrillation on chronic Eliquis, chronic systolic heart failure and chronic kidney disease stage IIIa; who presented to the emergency department secondary to increased shortness of breath and lower extremity swelling.  Patient reports missing couple doses of his Lasix and over the last week prior to admission has noticed increased shortness of breath with exertion and also orthopnea.  Patient expressed dry coughing spells and has noticed increase in his abdominal girth.  No chest pain, no nausea, no vomiting, no dysuria, no hematuria, no focal weaknesses, no sick contacts or any other complaints. Workup in the ED with a chest x-ray demonstrating vascular congestion and chronic bilateral pleural effusion suggesting congestive heart failure; elevated BNP (>4500) and positive crackles on physical exam.  COVID PCR negative.   IV Lasix has been provided and TR[facility] contacted to place patient in the hospital for further evaluation and management of acute on chronic heart failure exacerbation.  Patient with low output heart failure/ cardiogenic shock.  07/23 Central line was placed and was started on milrinone infusion. Transferred to [facility] from [facility].    07/26 stopped milrinone.  07/27 patient with worsening perfusion, back on milrinone. Transferred to ICU. 07/28 improved hemodynamics on milrinone 0.25   Assessment and Plan: * Acute on chronic systolic HF (heart failure) (HCC) Echocardiogram with reduced LV systolic function with EF <20%, global hypokinesis, moderate to severe dilatation of LV cavity, no LVH, RV  systolic function with severe reduction, moderate enlargement of RV cavity, RVSP 50.0, LA and RA with moderate dilatation, moderate to severe mitral regurgitation, moderate to severe tricuspid regurgitation.  Biventricular failure. Acute on chronic core pulmonale. Pulmonary hypertension.  Low output cardiac failure, cardiogenic shock.  07/25 cardiac catheterization. PA 58/32 mean 42 PCWP 34  Cardiac output 5,3 and index 2.6 (Fick). PVR 1.5 W  Urine output is 1,050  ml Systolic blood pressure 97 to 578 mmHg.  SV02 down to 56.7  Warm lower extremities.   Continue with milrinone 0.25 mcg per Kg/min  Holding furosemide for now.  Limited pharmacologic therapy due to risk of hypotension.   PAF (paroxysmal atrial fibrillation) (HCC) Episodic NSVT.  Transitioned to po amiodarone.  Anticoagulation with apixaban.    Acute kidney injury superimposed on chronic kidney disease (HCC) CKD stage 3b. Hypokalemia, hyponatremia.  Renal function with serum cr at 2,76 with K at 3,7 and serum bicarbonate at 28, Na 132. Mg 2.4   Holding furosemide for now.  Follow up renal function in am, avoid further hypotension or nephrotoxic medications.   Type 2 diabetes mellitus with hyperlipidemia (HCC) Uncontrolled T2DM with hyperglycemia.   Continue insulin sliding scale for glucose cover and monitoring.  On gabapentin for neuropathy. Continue with statin therapy.   Tobacco use disorder -Cessation counseling provided. -Nicotine patch has been ordered.  History of DVT (deep vein thrombosis) Anticoagulation with apixaban.   Hypothyroidism Continue with levothyroxine.         Subjective: Patient with no chest pain, dyspnea has improved, along with mentation, no chest pain   Physical Exam: Vitals:   08/15/22 1300 08/15/22 1330 08/15/22 1400 08/15/22 1430  BP: 108/66 96/71 107/63 103/66  Pulse: 89 88 83 76  Resp: (!) 33 19 (!) 30 20  Temp:      TempSrc:      SpO2: 95% 90% 92% 92%   Weight:      Height:       Neurology awake and alert ENT with mild pallor Cardiovascular with S1 and S2 present and regular with no gallops or rubs, positive systolic murmur at the apex No JVD No lower extremity edema Respiratory with no rales or wheezing, no rhonchi Abdomen with no distention  Warm lower extremity edema  Data Reviewed:    Family Communication: no family at the bedside   Disposition: Status is: Inpatient Remains inpatient appropriate because: heart failure with low output, cardiogenic shock.   Planned Discharge Destination: Home     Author: Coralie Keens, MD 08/15/2022 3:13 PM  For on call review [email or web address].

## 2022-08-15 NOTE — Evaluation (Signed)
Physical Therapy Evaluation Patient Details Name: [name] [name] MRN: [id number] DOB: 1956/01/04 Today's Date: 08/15/2022  History of Present Illness  Pt is 67 year old presented to [facility] on  08/08/22 for acute on chronic heart failure and cardiogenic shock. PMH - CHF, CKD 3, HTN, LV thrombus, PAF, T2DM.  Clinical Impression  Pt presents to PT with slightl decr in mobility due to illness and inactivity. Expect pt will make good progress back to baseline with mobility. Will follow acutely but doubt pt will need PT after DC.          Assistance Recommended at Discharge PRN  If plan is discharge home, recommend the following:  Can travel by private vehicle           Equipment Recommendations None recommended by PT  Recommendations for Other Services       Functional Status Assessment Patient has had a recent decline in their functional status and demonstrates the ability to make significant improvements in function in a reasonable and predictable amount of time.     Precautions / Restrictions Restrictions Weight Bearing Restrictions: No      Mobility  Bed Mobility Overal bed mobility: Modified Independent             General bed mobility comments: Incr time due to lines/tubes    Transfers Overall transfer level: Needs assistance Equipment used: None Transfers: Sit to/from Stand Sit to Stand: Min guard           General transfer comment: Assist for safety and lines    Ambulation/Gait Ambulation/Gait assistance: Min guard Gait Distance (Feet): 750 Feet Assistive device: IV Pole, None Gait Pattern/deviations: Decreased stride length Gait velocity: decr Gait velocity interpretation: 1.31 - 2.62 ft/sec, indicative of limited community ambulator   General Gait Details: Assist for safety and lines  Stairs            Wheelchair Mobility     Tilt Bed    Modified Rankin (Stroke Patients Only)       Balance Overall balance assessment: Mild  deficits observed, not formally tested                                           Pertinent Vitals/Pain Pain Assessment Pain Assessment: No/denies pain    Home Living Family/patient expects to be discharged to:: Private residence Living Arrangements: Spouse/significant other Available Help at Discharge: Family;Available 24 hours/day Type of Home: House Home Access: Stairs to enter   [employer] of Steps: 1   Home Layout: One level Home Equipment: Agricultural consultant (2 wheels)      Prior Function Prior Level of Function : Independent/Modified Independent;Driving             Mobility Comments: no assistive device       Hand Dominance   Dominant Hand: Right    Extremity/Trunk Assessment   Upper Extremity Assessment Upper Extremity Assessment: Overall WFL for tasks assessed    Lower Extremity Assessment Lower Extremity Assessment: Generalized weakness       Communication   Communication: No difficulties  Cognition Arousal/Alertness: Awake/alert Behavior During Therapy: WFL for tasks assessed/performed Overall Cognitive Status: Within Functional Limits for tasks assessed  General Comments General comments (skin integrity, edema, etc.): VSS on RA    Exercises     Assessment/Plan    PT Assessment Patient needs continued PT services  PT Problem List Decreased strength;Decreased activity tolerance;Decreased mobility       PT Treatment Interventions DME instruction;Gait training;Functional mobility training;Therapeutic activities;Therapeutic exercise;Balance training;Patient/family education    PT Goals (Current goals can be found in the Care Plan section)  Acute Rehab PT Goals Patient Stated Goal: return home PT Goal Formulation: With patient Time For Goal Achievement: 08/29/22 Potential to Achieve Goals: Good    Frequency Min 1X/week     Co-evaluation                AM-PAC PT "6 Clicks" Mobility  Outcome Measure Help needed turning from your back to your side while in a flat bed without using bedrails?: None Help needed moving from lying on your back to sitting on the side of a flat bed without using bedrails?: None Help needed moving to and from a bed to a chair (including a wheelchair)?: A Little Help needed standing up from a chair using your arms (e.g., wheelchair or bedside chair)?: A Little Help needed to walk in hospital room?: A Little Help needed climbing 3-5 steps with a railing? : A Little 6 Click Score: 20    End of Session   Activity Tolerance: Patient tolerated treatment well Patient left: in chair;with call bell/phone within reach;with chair alarm set Nurse Communication: Mobility status PT Visit Diagnosis: Other abnormalities of gait and mobility (R26.89);Muscle weakness (generalized) (M62.81)    Time: 1027-2536 PT Time Calculation (min) (ACUTE ONLY): 22 min   Charges:   PT Evaluation $PT Eval Low Complexity: 1 Low   PT General Charges $$ ACUTE PT VISIT: 1 Visit         [facility] PT Acute Rehabilitation Services Office [phone]   Angelina Ok [facility] 08/15/2022, 9:57 AM

## 2022-08-16 ENCOUNTER — Other Ambulatory Visit (HOSPITAL_COMMUNITY): Payer: Medicare HMO

## 2022-08-16 ENCOUNTER — Encounter (HOSPITAL_COMMUNITY)
Admission: EM | Disposition: A | Discharge status: Home / Self Care | Payer: Self-pay | Source: Home / Self Care | Attending: Internal Medicine

## 2022-08-16 ENCOUNTER — Inpatient Hospital Stay (HOSPITAL_COMMUNITY): Payer: Medicare HMO

## 2022-08-16 DIAGNOSIS — E1169 Type 2 diabetes mellitus with other specified complication: Secondary | ICD-10-CM | POA: Diagnosis not present

## 2022-08-16 DIAGNOSIS — I5023 Acute on chronic systolic (congestive) heart failure: Secondary | ICD-10-CM | POA: Diagnosis not present

## 2022-08-16 DIAGNOSIS — I480 Paroxysmal atrial fibrillation: Secondary | ICD-10-CM | POA: Diagnosis not present

## 2022-08-16 DIAGNOSIS — Z515 Encounter for palliative care: Secondary | ICD-10-CM

## 2022-08-16 DIAGNOSIS — R570 Cardiogenic shock: Secondary | ICD-10-CM | POA: Diagnosis not present

## 2022-08-16 DIAGNOSIS — N179 Acute kidney failure, unspecified: Secondary | ICD-10-CM | POA: Diagnosis not present

## 2022-08-16 DIAGNOSIS — Z7189 Other specified counseling: Secondary | ICD-10-CM

## 2022-08-16 LAB — GLUCOSE, CAPILLARY
Glucose-Capillary: 112 mg/dL — ABNORMAL HIGH (ref 70–99)
Glucose-Capillary: 120 mg/dL — ABNORMAL HIGH (ref 70–99)
Glucose-Capillary: 127 mg/dL — ABNORMAL HIGH (ref 70–99)
Glucose-Capillary: 213 mg/dL — ABNORMAL HIGH (ref 70–99)

## 2022-08-16 SURGERY — ECHOCARDIOGRAM, TRANSESOPHAGEAL
Anesthesia: General

## 2022-08-16 MED ORDER — AMIODARONE HCL IN DEXTROSE 360-4.14 MG/200ML-% IV SOLN
30.0000 mg/h | INTRAVENOUS | Status: DC
  Administered 2022-08-16 – 2022-08-18 (×4): 30 mg/h via INTRAVENOUS
  Filled 2022-08-16 (×4): qty 200

## 2022-08-16 MED ORDER — AMIODARONE LOAD VIA INFUSION
150.0000 mg | Freq: Once | INTRAVENOUS | Status: AC
  Administered 2022-08-16: 150 mg via INTRAVENOUS
  Filled 2022-08-16: qty 83.34

## 2022-08-16 MED ORDER — POTASSIUM CHLORIDE CRYS ER 20 MEQ PO TBCR
40.0000 meq | EXTENDED_RELEASE_TABLET | Freq: Once | ORAL | Status: DC
  Filled 2022-08-16: qty 2

## 2022-08-16 MED ORDER — POTASSIUM CHLORIDE CRYS ER 20 MEQ PO TBCR
40.0000 meq | EXTENDED_RELEASE_TABLET | Freq: Once | ORAL | Status: AC
  Administered 2022-08-16: 40 meq via ORAL
  Filled 2022-08-16: qty 2

## 2022-08-16 MED ORDER — AMIODARONE HCL IN DEXTROSE 360-4.14 MG/200ML-% IV SOLN
60.0000 mg/h | INTRAVENOUS | Status: DC
  Administered 2022-08-16 (×2): 60 mg/h via INTRAVENOUS
  Filled 2022-08-16 (×2): qty 200

## 2022-08-16 NOTE — Progress Notes (Addendum)
Advanced Heart Failure Rounding Note  PCP-Cardiologist: Dina Rich, MD   Subjective:   Admitted from [facility] with cardiogenic shock. Lactic acid 2.1. CO-OX < 40%. Started on milrinone 0.125 mcg >> increased to 0.25 mcg.   Became hypotensive and bradycardic on 07/27. Amiodarone stopped. Moved to ICU.  CVP 2 this morning. Weigh up 2 lbs.   CO-OX 64% on Milrinone 0.25 mcg.   SR with PACs yesterday, back in Afib this am   Objective:   Weight Range: 77.8 kg Body mass index is 23.26 kg/m.   Vital Signs:   Temp:  [97.5 F (36.4 C)-99.7 F (37.6 C)] 97.5 F (36.4 C) (07/29 0700) Pulse Rate:  [72-94] 82 (07/29 0400) Resp:  [13-33] 15 (07/29 0400) BP: (85-122)/(48-93) 91/61 (07/29 0400) SpO2:  [70 %-97 %] 92 % (07/29 0400) Weight:  [77.8 kg] 77.8 kg (07/29 0500) Last BM Date : 08/14/22  Weight change: Filed Weights   08/14/22 0441 08/15/22 0500 08/16/22 0500  Weight: 78.8 kg 77.5 kg 77.8 kg    Intake/Output:   Intake/Output Summary (Last 24 hours) at 08/16/2022 0948 Last data filed at 08/16/2022 0830 Gross per 24 hour  Intake 374.68 ml  Output 3470 ml  Net -3095.32 ml     CVP 9 Physical Exam  General:  No distress. Chronically ill appearing. HEENT: normal Neck: supple. JVP 8-10. Carotids 2+ bilat; no bruits.  Cor: PMI nondisplaced. Irregular rhythm 80s-90s. No rubs, gallops or murmurs. Lungs: clear Abdomen: soft, nontender, nondistended.  Extremities: no cyanosis, clubbing, rash, edema Neuro: alert & orientedx3. Affect pleasant   Telemetry   Afib 80s-90s  EKG    N/A   Labs    CBC Recent Labs    08/15/22 0445 08/16/22 0404  WBC 8.0 7.0  HGB 10.9* 11.0*  HCT 33.3* 34.3*  MCV 96.8 98.3  PLT 225 217   Basic Metabolic Panel Recent Labs    16/10/96 0445 08/15/22 1525 08/16/22 0404  NA 132* 135 135  K 3.7 3.8 3.3*  CL 91* 91* 94*  CO2 28 29 31   GLUCOSE 136* 120* 123*  BUN 61* 62* 59*  CREATININE 2.75* 2.86* 2.71*  CALCIUM 8.6* 8.4*  8.4*  MG 2.4  --  2.2   Liver Function Tests No results for input(s): "AST", "ALT", "ALKPHOS", "BILITOT", "PROT", "ALBUMIN" in the last 72 hours.  No results for input(s): "LIPASE", "AMYLASE" in the last 72 hours. Cardiac Enzymes No results for input(s): "CKTOTAL", "CKMB", "CKMBINDEX", "TROPONINI" in the last 72 hours.  BNP: BNP (last 3 results) Recent Labs    04/23/22 0638 08/08/22 1211 08/10/22 0926  BNP 1,260.0* >4,500.0* 2,475.0*    ProBNP (last 3 results) No results for input(s): "PROBNP" in the last 8760 hours.   D-Dimer No results for input(s): "DDIMER" in the last 72 hours. Hemoglobin A1C No results for input(s): "HGBA1C" in the last 72 hours.  Fasting Lipid Panel No results for input(s): "CHOL", "HDL", "LDLCALC", "TRIG", "CHOLHDL", "LDLDIRECT" in the last 72 hours. Thyroid Function Tests No results for input(s): "TSH", "T4TOTAL", "T3FREE", "THYROIDAB" in the last 72 hours.  Invalid input(s): "FREET3"   Other results:   Imaging    No results found.   Medications:     Scheduled Medications:  apixaban  5 mg Oral BID   aspirin EC  81 mg Oral Q0600   Chlorhexidine Gluconate Cloth  6 each Topical Q0600   dextromethorphan-guaiFENesin  1 tablet Oral BID   furosemide  80 mg Intravenous BID  gabapentin  100 mg Oral TID   insulin aspart  0-5 Units Subcutaneous QHS   insulin aspart  0-9 Units Subcutaneous TID WC   levothyroxine  137 mcg Oral q morning   magnesium oxide  400 mg Oral Daily   nicotine  14 mg Transdermal Daily   pantoprazole  40 mg Oral Daily   potassium chloride  40 mEq Oral Once   rosuvastatin  20 mg Oral Daily   sodium chloride flush  3 mL Intravenous Q12H   sodium chloride flush  3 mL Intravenous Q12H   tamsulosin  0.4 mg Oral Daily    Infusions:  sodium chloride     sodium chloride     amiodarone 60 mg/hr (08/16/22 0931)   Followed by   amiodarone     milrinone 0.25 mcg/kg/min (08/16/22 0800)    PRN Medications: sodium  chloride, sodium chloride, acetaminophen **OR** acetaminophen, acetaminophen, ondansetron **OR** ondansetron (ZOFRAN) IV, ondansetron (ZOFRAN) IV, mouth rinse, oxyCODONE, sodium chloride flush, sodium chloride flush    Patient Profile   67 y/o male with PMH of 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 Admitted for hypoxemic respiratory failure 2/2 acute HFrEF exacerbation and transferred to [facility] for progressive BiV systolic heart failure and low output state.   Assessment/Plan   1. SCAI C Cardiogenic Shock Severe biventricular failure on TTE from 08/09/22; likely exacerbated by medication non-compliance & progressive low output HF. AF may have also contributed. Initial mixed venous 33.7% at [facility], Fick CI of 1.2 L/min/m2.  RHC 07/25: Elevated filling pressures with preserved CO on milrinone CO-OX 59% this am on milrinone 0.25 Lactic acid cleared 2.1>1.   CVP 9. Continue IV lasix. Supp k.  Not a candidate for advanced therapies at this time. RV failure and multiple comorbid conditions preclude him from LVAD or transplant. 2. Paroxysmal A-fib Appears to be in and out of Afib. Check ECG.  Restart amiodarone gtt Likely limited benefit to cardioversion. Continue amio load Heparin gtt 4. AKI on CKD stage IIIb Cr peak 3 and down to 2.7. Baseline 1.8.  Monitor with diuresis 5. Progressive mod/severe MR and new mod/severe TR Not sure if he would be a great candidate for mTEER Could consider TEE depending on his progress 6. Hx of apical thrombus first seen in 2021 Most recent TTE without thrombus, he will continue on anticoagulation for atrial fibrillation anyway Heparin gtt 7. OSA/HTN Severe OSA on outpatient sleep study CPAP nightly 8. Non-obstructive CAD/PAD/HLD No signs of  ACS Continue aspirin 81 mg daily 9. Tobacco use disorder Encourage smoking cessation 10. T2DM/Hypothyroidism Per primary   Would benefit from palliative care consult for GOC discussion.   Length of Stay: 8  Jacarra Bobak N, PA-C  08/16/2022, 9:48 AM  Advanced Heart Failure Team Pager [phone] (M-F; 7a - 5p)  Please contact [facility] for night-coverage after hours (5p -7a ) and weekends on [email or web address]

## 2022-08-16 NOTE — Progress Notes (Signed)
Progress Note   Patient: [name] [name] GLO:[id number] DOB: 1955-06-15 DOA: 08/08/2022     8 DOS: the patient was seen and examined on 08/16/2022   Brief hospital course: [name] was admitted to the hospital with the working diagnosis of heart failure exacerbation.   67 y.o. male with medical history significant of type 2 diabetes with nephropathy, hypertension, hyperlipidemia, paroxysmal atrial fibrillation on chronic Eliquis, chronic systolic heart failure and chronic kidney disease stage IIIa; who presented to the emergency department secondary to increased shortness of breath and lower extremity swelling.  Patient reports missing couple doses of his Lasix and over the last week prior to admission has noticed increased shortness of breath with exertion and also orthopnea.  Patient expressed dry coughing spells and has noticed increase in his abdominal girth.  No chest pain, no nausea, no vomiting, no dysuria, no hematuria, no focal weaknesses, no sick contacts or any other complaints. Workup in the ED with a chest x-ray demonstrating vascular congestion and chronic bilateral pleural effusion suggesting congestive heart failure; elevated BNP (>4500) and positive crackles on physical exam.  COVID PCR negative.   IV Lasix has been provided and TR[facility] contacted to place patient in the hospital for further evaluation and management of acute on chronic heart failure exacerbation.  Patient with low output heart failure/ cardiogenic shock.  07/23 Central line was placed and was started on milrinone infusion. Transferred to [facility] from [facility].    07/26 stopped milrinone.  07/27 patient with worsening perfusion, back on milrinone. Transferred to ICU. 07/28 improved hemodynamics on milrinone 0.25   Assessment and Plan: * Acute on chronic systolic HF (heart failure) (HCC) Echocardiogram with reduced LV systolic function with EF <20%, global hypokinesis, moderate to severe dilatation of LV cavity, no LVH, RV  systolic function with severe reduction, moderate enlargement of RV cavity, RVSP 50.0, LA and RA with moderate dilatation, moderate to severe mitral regurgitation, moderate to severe tricuspid regurgitation.  Biventricular failure. Acute on chronic core pulmonale. Pulmonary hypertension.  Low output cardiac failure, cardiogenic shock.  07/25 cardiac catheterization. PA 58/32 mean 42 PCWP 34  Cardiac output 5,3 and index 2.6 (Fick). PVR 1.5 W  Urine output is 3,050 ml Systolic blood pressure 113 to 127 mmHg.  SV02 down to 59.1  Warm lower extremities.   Continue with milrinone 0.25 mcg per Kg/min  Furosemide 80 mg IV q12 hrs  Limited pharmacologic therapy due to risk of hypotension.   PAF (paroxysmal atrial fibrillation) (HCC) Episodic NSVT.  Continue with amiodarone, possible direct current cardioversion.  Anticoagulation with apixaban.    Acute kidney injury superimposed on chronic kidney disease (HCC) CKD stage 3b. Hypokalemia, hyponatremia.  Improved volume status.  Renal function with serum cr at 2,71 with K at 3,3 and serum bicarbonate at 31.  Na 135  Mg 2,2   Continue K correction with Kcl. Diuresis with furosemide 80 mg IV q12 Follow up renal function and electrolytes in am.   Type 2 diabetes mellitus with hyperlipidemia (HCC) Uncontrolled T2DM with hyperglycemia.   Continue insulin sliding scale for glucose cover and monitoring.  On gabapentin for neuropathy. Continue with statin therapy.   Tobacco use disorder -Cessation counseling provided. -Nicotine patch has been ordered.  History of DVT (deep vein thrombosis) Anticoagulation with apixaban.   Hypothyroidism Continue with levothyroxine.         Subjective: Patient is feeling better, no chest pain, no dyspnea or edema   Physical Exam: Vitals:   08/16/22 0300 08/16/22 0400  08/16/22 0500 08/16/22 0700  BP: 101/74 91/61    Pulse: 73 82    Resp: 16 15    Temp:    (!) 97.5 F (36.4 C)   TempSrc:    Oral  SpO2: 90% 92%    Weight:   77.8 kg   Height:       Neurology awake and alert ENT with no pallor Cardiovascular with S1 and S2 present, irregularly irregular with no gallops or rubs, positive systolic murmur at the apex No JVD No lower extremities, warm temperature Respiratory with no rales or wheezing, no rhonchi Abdomen with no distention  Data Reviewed:    Family Communication: no family at the bedside   Disposition: Status is: Inpatient Remains inpatient appropriate because: recovering cardiogenic shock   Planned Discharge Destination: Home      Author: Coralie Keens, MD 08/16/2022 9:24 AM  For on call review [email or web address]

## 2022-08-16 NOTE — Consult Note (Signed)
Palliative Care Consult Note                                  Date: 08/16/2022   Patient Name: [name] [name]  DOB: 1955/10/27  MRN: [id number]  Age / Sex: 67 y.o., male  PCP: Rica Records, FNP Referring Physician: Coralie Keens  Reason for Consultation: Establishing goals of care  HPI/Patient Profile: 67 y.o. male  with past medical history of HFrEF, nonischemic cardiomyopathy, CKD stage IIIa, paroxysmal atrial fibrillation on Eliquis, type 2 diabetes with neuropathy, hypertension, and hyperlipidemia who presented to the ED on 08/08/2022 with increased shortness of breath and lower extremity swelling.  Patient reports missing several doses of Lasix over the past week.  Workup in the ED showed elevated BNP and chest x-ray with vascular congestion and chronic bilateral pleural effusion.  Patient was admitted with acute on chronic systolic heart failure.  Past Medical History:  Diagnosis Date   Anxiety    Apical mural thrombus    Ascending aorta dilatation (HCC)    Chronic HFrEF (heart failure with reduced ejection fraction) (HCC)    a. EF 15% in 2013 with cath showing normal cors b. EF 30-35% by repeat echo in 2015   CKD stage 3b, GFR 30-44 ml/min (HCC)    Cocaine use    Diabetes mellitus    History of noncompliance with medical treatment, presenting hazards to health    Hyperlipidemia    Hypertension    Mitral regurgitation    NICM (nonischemic cardiomyopathy) (HCC)    PAD (peripheral artery disease) (HCC)    PAF (paroxysmal atrial fibrillation) (HCC)     Subjective:   I have reviewed medical records including progress notes, labs and imaging, and received an update from the RN.  I met with patient at bedside to discuss diagnosis, prognosis, GOC, disposition, and options.  I introduced Palliative Medicine as specialized medical care for people living with serious illness. It focuses on providing relief  from the symptoms and stress of a serious illness.   We discussed patient's current illness and what it means in the larger context of his/her ongoing co-morbidities. Current clinical status was reviewed. Natural disease trajectory of *** was discussed.  Created space and opportunity for patient and family to explore thoughts and feelings regarding current medical situation. Values and goals of care were attempted to be elicited.  A discussion was had today regarding advanced directives. Concepts specific to code status, artifical feeding and hydration, continued IV antibiotics and rehospitalization was had.  The MOST form was introduced and discussed.  Questions and concerns addressed. Patient/family encouraged to call with questions or concerns.    Life Review: ***  Functional Status: ***  Patient/Family Understanding of Illness: Patient tells his "heart is weak" and that he has "too much fluid".   Goals: ***  GOC Discussion: ***  Review of Systems  Objective:   Primary Diagnoses: Present on Admission:  Acute on chronic systolic HF (heart failure) (HCC)  Acute kidney injury superimposed on chronic kidney disease (HCC)  Type 2 diabetes mellitus with hyperlipidemia (HCC)  PAF (paroxysmal atrial fibrillation) (HCC)  Hypothyroidism  Tobacco use disorder   Physical Exam  Vital Signs:  BP 118/70   Pulse 81   Temp 97.8 F (36.6 C) (Oral)   Resp 18   Ht 6' (1.829 m)   Wt 77.8 kg   SpO2 96%   BMI  23.26 kg/m   Palliative Assessment/Data: ***     Assessment & Plan:   SUMMARY OF RECOMMENDATIONS   Full code  Full scope PMT will continue to follow  Primary Decision Maker: {Primary Decision NUUVO:53664}  Code Status/Advance Care Planning: {Palliative Code status:23503}  Symptom Management:  ***  Prognosis:  {Palliative Care Prognosis:23504}  Discharge Planning:  {Palliative dispostion:23505}   Discussed with: ***    Thank you for allowing [place] to  participate in the care of Clearence Ped   Time Total: ***  Greater than 50%  of this time was spent counseling and coordinating care related to the above assessment and plan.  Signed by: Sherlean Foot, NP Palliative Medicine Team  Team Phone # [phone]  For individual providers, please see AMION

## 2022-08-16 NOTE — Plan of Care (Signed)
  Problem: Education: Goal: Ability to demonstrate management of disease process will improve Outcome: Progressing Goal: Ability to verbalize understanding of medication therapies will improve Outcome: Progressing   Problem: Activity: Goal: Capacity to carry out activities will improve Outcome: Progressing   Problem: Cardiac: Goal: Ability to achieve and maintain adequate cardiopulmonary perfusion will improve Outcome: Progressing   Problem: Education: Goal: Knowledge of General Education information will improve Description: Including pain rating scale, medication(s)/side effects and non-pharmacologic comfort measures Outcome: Progressing   Problem: Health Behavior/Discharge Planning: Goal: Ability to manage health-related needs will improve Outcome: Progressing   Problem: Clinical Measurements: Goal: Ability to maintain clinical measurements within normal limits will improve Outcome: Progressing Goal: Will remain free from infection Outcome: Progressing Goal: Diagnostic test results will improve Outcome: Progressing Goal: Respiratory complications will improve Outcome: Progressing Goal: Cardiovascular complication will be avoided Outcome: Progressing   Problem: Activity: Goal: Risk for activity intolerance will decrease Outcome: Progressing   Problem: Nutrition: Goal: Adequate nutrition will be maintained Outcome: Progressing   Problem: Coping: Goal: Level of anxiety will decrease Outcome: Progressing   Problem: Elimination: Goal: Will not experience complications related to bowel motility Outcome: Progressing Goal: Will not experience complications related to urinary retention Outcome: Progressing   Problem: Pain Managment: Goal: General experience of comfort will improve Outcome: Progressing   Problem: Safety: Goal: Ability to remain free from injury will improve Outcome: Progressing   Problem: Skin Integrity: Goal: Risk for impaired skin integrity  will decrease Outcome: Progressing   Problem: Education: Goal: Ability to describe self-care measures that may prevent or decrease complications (Diabetes Survival Skills Education) will improve Outcome: Progressing   Problem: Coping: Goal: Ability to adjust to condition or change in health will improve Outcome: Progressing   Problem: Fluid Volume: Goal: Ability to maintain a balanced intake and output will improve Outcome: Progressing   Problem: Health Behavior/Discharge Planning: Goal: Ability to identify and utilize available resources and services will improve Outcome: Progressing Goal: Ability to manage health-related needs will improve Outcome: Progressing   Problem: Metabolic: Goal: Ability to maintain appropriate glucose levels will improve Outcome: Progressing   Problem: Nutritional: Goal: Maintenance of adequate nutrition will improve Outcome: Progressing Goal: Progress toward achieving an optimal weight will improve Outcome: Progressing   Problem: Skin Integrity: Goal: Risk for impaired skin integrity will decrease Outcome: Progressing   Problem: Tissue Perfusion: Goal: Adequacy of tissue perfusion will improve Outcome: Progressing   Problem: Education: Goal: Understanding of CV disease, CV risk reduction, and recovery process will improve Outcome: Progressing Goal: Individualized Educational Video(s) Outcome: Progressing   Problem: Activity: Goal: Ability to return to baseline activity level will improve Outcome: Progressing   Problem: Cardiovascular: Goal: Ability to achieve and maintain adequate cardiovascular perfusion will improve Outcome: Progressing Goal: Vascular access site(s) Level 0-1 will be maintained Outcome: Progressing   Problem: Health Behavior/Discharge Planning: Goal: Ability to safely manage health-related needs after discharge will improve Outcome: Progressing

## 2022-08-16 NOTE — Progress Notes (Addendum)
1610: Attempted to page triad hospitalist  [phone]: Attempted to page triad hospitalist  [phone]: Spoke with pharmacist Misty Stanley on unit, to place orders for K+ replacement. 1200: Pt refused bath at this time  1700: Pt refused bath at this time.

## 2022-08-17 ENCOUNTER — Other Ambulatory Visit (HOSPITAL_COMMUNITY): Payer: Self-pay

## 2022-08-17 DIAGNOSIS — I5023 Acute on chronic systolic (congestive) heart failure: Secondary | ICD-10-CM | POA: Diagnosis not present

## 2022-08-17 DIAGNOSIS — N179 Acute kidney failure, unspecified: Secondary | ICD-10-CM | POA: Diagnosis not present

## 2022-08-17 DIAGNOSIS — E1169 Type 2 diabetes mellitus with other specified complication: Secondary | ICD-10-CM | POA: Diagnosis not present

## 2022-08-17 DIAGNOSIS — I480 Paroxysmal atrial fibrillation: Secondary | ICD-10-CM | POA: Diagnosis not present

## 2022-08-17 LAB — GLUCOSE, CAPILLARY
Glucose-Capillary: 209 mg/dL — ABNORMAL HIGH (ref 70–99)
Glucose-Capillary: 110 mg/dL — ABNORMAL HIGH (ref 70–99)
Glucose-Capillary: 188 mg/dL — ABNORMAL HIGH (ref 70–99)

## 2022-08-17 MED ORDER — POTASSIUM CHLORIDE CRYS ER 20 MEQ PO TBCR
40.0000 meq | EXTENDED_RELEASE_TABLET | Freq: Once | ORAL | Status: AC
  Administered 2022-08-17: 40 meq via ORAL
  Filled 2022-08-17: qty 2

## 2022-08-17 MED ORDER — POTASSIUM CHLORIDE CRYS ER 20 MEQ PO TBCR
40.0000 meq | EXTENDED_RELEASE_TABLET | Freq: Two times a day (BID) | ORAL | Status: DC
  Administered 2022-08-17 – 2022-08-19 (×5): 40 meq via ORAL
  Filled 2022-08-17 (×5): qty 2

## 2022-08-17 MED ORDER — FUROSEMIDE 10 MG/ML IJ SOLN
80.0000 mg | Freq: Two times a day (BID) | INTRAMUSCULAR | Status: AC
  Administered 2022-08-17: 80 mg via INTRAVENOUS
  Filled 2022-08-17: qty 8

## 2022-08-17 NOTE — Progress Notes (Addendum)
Advanced Heart Failure Rounding Note  PCP-Cardiologist: Dina Rich, MD   Subjective:    CO-OX 66% on milrinone 0.125  4.4L UOP last 24 hrs with IV lasix. CVP 7.   Converted to SR yesterday afternoon.   Feeling well. No concerns.  Objective:   Weight Range: 75.6 kg Body mass index is 22.6 kg/m.   Vital Signs:   Temp:  [97.8 F (36.6 C)-98.3 F (36.8 C)] 98.3 F (36.8 C) (07/30 0725) Pulse Rate:  [64-97] 88 (07/30 0800) Resp:  [0-34] 25 (07/30 0800) BP: (95-137)/(49-99) 128/89 (07/30 0800) SpO2:  [85 %-99 %] 96 % (07/30 0800) Weight:  [75.6 kg] 75.6 kg (07/30 0500) Last BM Date : 08/14/22  Weight change: Filed Weights   08/15/22 0500 08/16/22 0500 08/17/22 0500  Weight: 77.5 kg 77.8 kg 75.6 kg    Intake/Output:   Intake/Output Summary (Last 24 hours) at 08/17/2022 0828 Last data filed at 08/17/2022 0745 Gross per 24 hour  Intake 1949.59 ml  Output 4460 ml  Net -2510.41 ml     CVP 7 Physical Exam  General:  Chronically ill appearing HEENT: normal Neck: supple. JVP 7-8. Carotids 2+ bilat; no bruits.  Cor: PMI nondisplaced. Regular rate & rhythm. No rubs, gallops or murmurs. Lungs: clear Abdomen: soft, nontender, nondistended.  Extremities: no cyanosis, clubbing, rash, edema Neuro: alert & orientedx3. Affect pleasant    Telemetry   SR 80s  EKG    N/A   Labs    CBC Recent Labs    08/16/22 0404 08/17/22 0246  WBC 7.0 6.5  HGB 11.0* 11.3*  HCT 34.3* 35.9*  MCV 98.3 98.4  PLT 217 239   Basic Metabolic Panel Recent Labs    16/10/96 0404 08/17/22 0246  NA 135 140  K 3.3* 3.4*  CL 94* 97*  CO2 31 32  GLUCOSE 123* 150*  BUN 59* 57*  CREATININE 2.71* 2.58*  CALCIUM 8.4* 8.5*  MG 2.2 2.3   Liver Function Tests No results for input(s): "AST", "ALT", "ALKPHOS", "BILITOT", "PROT", "ALBUMIN" in the last 72 hours.  No results for input(s): "LIPASE", "AMYLASE" in the last 72 hours. Cardiac Enzymes No results for input(s):  "CKTOTAL", "CKMB", "CKMBINDEX", "TROPONINI" in the last 72 hours.  BNP: BNP (last 3 results) Recent Labs    04/23/22 0638 08/08/22 1211 08/10/22 0926  BNP 1,260.0* >4,500.0* 2,475.0*    ProBNP (last 3 results) No results for input(s): "PROBNP" in the last 8760 hours.   D-Dimer No results for input(s): "DDIMER" in the last 72 hours. Hemoglobin A1C No results for input(s): "HGBA1C" in the last 72 hours.  Fasting Lipid Panel No results for input(s): "CHOL", "HDL", "LDLCALC", "TRIG", "CHOLHDL", "LDLDIRECT" in the last 72 hours. Thyroid Function Tests No results for input(s): "TSH", "T4TOTAL", "T3FREE", "THYROIDAB" in the last 72 hours.  Invalid input(s): "FREET3"   Other results:   Imaging    DG CHEST PORT 1 VIEW  Result Date: 08/16/2022 CLINICAL DATA:  Evaluate central venous catheter placement EXAM: PORTABLE CHEST 1 VIEW COMPARISON:  08/14/2022 FINDINGS: There is a right IJ catheter with tip in the projection of the SVC. No pneumothorax visualized. Stable cardiomediastinal contours. Bilateral pleural effusions are identified, right greater than left. These appear similar to the previous exam. Mild interstitial prominence is also unchanged. IMPRESSION: 1. Right IJ catheter with tip in the projection of the SVC. No pneumothorax visualized. 2. No change in veil like opacification over bilateral lungs compatible with posterior layering pleural effusions. Electronically Signed  By: Signa Kell M.D.   On: 08/16/2022 12:16     Medications:     Scheduled Medications:  apixaban  5 mg Oral BID   aspirin EC  81 mg Oral Q0600   Chlorhexidine Gluconate Cloth  6 each Topical Q0600   dextromethorphan-guaiFENesin  1 tablet Oral BID   furosemide  80 mg Intravenous BID   gabapentin  100 mg Oral TID   insulin aspart  0-5 Units Subcutaneous QHS   insulin aspart  0-9 Units Subcutaneous TID WC   levothyroxine  137 mcg Oral q morning   magnesium oxide  400 mg Oral Daily   nicotine   14 mg Transdermal Daily   pantoprazole  40 mg Oral Daily   rosuvastatin  20 mg Oral Daily   tamsulosin  0.4 mg Oral Daily    Infusions:  amiodarone 30 mg/hr (08/17/22 0600)   milrinone 0.125 mcg/kg/min (08/17/22 0600)    PRN Medications: acetaminophen **OR** acetaminophen, acetaminophen, ondansetron **OR** ondansetron (ZOFRAN) IV, ondansetron (ZOFRAN) IV, mouth rinse, oxyCODONE    Patient Profile   67 y/o male with PMH of 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 Admitted for hypoxemic respiratory failure 2/2 acute HFrEF exacerbation and transferred to [facility] for progressive BiV systolic heart failure and low output state.   Assessment/Plan   1. SCAI C Cardiogenic Shock Severe biventricular failure on TTE from 08/09/22; likely exacerbated by medication non-compliance & progressive low output HF. AF may have also contributed. Initial mixed venous 33.7% at [facility], [place] CI of 1.2 L/min/m2.  RHC 07/25: Elevated filling pressures with preserved CO on milrinone CO-OX 66% on milrinone 0.125. Continue milrinone today to support CO while renal function recovering. Lactic acid cleared 2.1>1.  CVP 7. One more dose IV lasix this afternoon then switch to po tomorrow.  Not a candidate for advanced therapies at this time. RV failure and multiple comorbid conditions preclude him from LVAD or transplant. 2. Paroxysmal A-fib Appears to be in and out of Afib. Check ECG.  Converted to SR 07/29 with amiodarone gtt. Continue IV amio today. Switch to PO tomorrow Heparin gtt 4. AKI on CKD stage IIIb Cr peak 3 and down to 2.6. Baseline 1.8.  Monitor with diuresis 5. Progressive mod/severe MR and new mod/severe TR Not sure if he would be a great candidate for mTEER Could consider TEE depending on his  progress 6. Hx of apical thrombus first seen in 2021 Most recent TTE without thrombus, he will continue on anticoagulation for atrial fibrillation anyway Heparin gtt 7. OSA/HTN Severe OSA on outpatient sleep study CPAP nightly 8. Non-obstructive CAD/PAD/HLD No signs of ACS Continue aspirin 81 mg daily 9. Tobacco use disorder Encourage smoking cessation 10. T2DM/Hypothyroidism Per primary   Palliative care consulted for GOC discussion  SDOH:  -Med compliance will be an issue. Reports trouble affording some of his medications. PhamD reviewing patient assistance options. -Noncompliance with clinic f/u. Will have TOC CM assist with barriers. -Will see if paramedicine is available near his home   Length of Stay: 9  Liona Wengert N, PA-C  08/17/2022, 8:28 AM  Advanced Heart Failure Team Pager [phone] (M-F; 7a - 5p)  Please contact [facility] for night-coverage after hours (5p -7a ) and weekends on [email or web address]

## 2022-08-17 NOTE — Progress Notes (Signed)
Physical Therapy Treatment Patient Details Name: [name] [name] MRN: [id number] DOB: [birth year] Today's Date: 08/17/2022   History of Present Illness Pt is 67 year old presented to [facility] on  08/08/22 for acute on chronic heart failure and cardiogenic shock. PMH - CHF, CKD 3, HTN, LV thrombus, PAF, T2DM.    PT Comments  Pt progressing well despite transfer to ICU. Pt able to ambulate 1000' pushing IV pole. D/c recommendations remains appropriate. Acute PT to cont to follow but will also consult mobility team as well.    If plan is discharge home, recommend the following:     Can travel by private vehicle        Equipment Recommendations  None recommended by PT    Recommendations for Other Services       Precautions / Restrictions Restrictions Weight Bearing Restrictions: No     Mobility  Bed Mobility Overal bed mobility: Modified Independent             General bed mobility comments: Incr time due to lines/tubes    Transfers Overall transfer level: Needs assistance Equipment used: None Transfers: Sit to/from Stand Sit to Stand: Min guard           General transfer comment: Assist for safety and lines    Ambulation/Gait Ambulation/Gait assistance: Min guard Gait Distance (Feet): 1000 Feet Assistive device: IV Pole Gait Pattern/deviations: Decreased stride length, Wide base of support Gait velocity: decr Gait velocity interpretation: 1.31 - 2.62 ft/sec, indicative of limited community ambulator   General Gait Details: mild antalgia in R LE from previous surgery, denies SOB, reports itchiness and tingling sensation t/o LEs   Stairs             Wheelchair Mobility     Tilt Bed    Modified Rankin (Stroke Patients Only)       Balance Overall balance assessment: Mild deficits observed, not formally tested                                          Cognition Arousal/Alertness: Awake/alert Behavior During Therapy: WFL for  tasks assessed/performed Overall Cognitive Status: Within Functional Limits for tasks assessed                                          Exercises      General Comments General comments (skin integrity, edema, etc.): VSS on RA      Pertinent Vitals/Pain Pain Assessment Pain Assessment: No/denies pain (reports tingling and itchiness t/o bilat LEs s/p amb)    Home Living                          Prior Function            PT Goals (current goals can now be found in the care plan section) Acute Rehab PT Goals Patient Stated Goal: return home PT Goal Formulation: With patient Time For Goal Achievement: 08/29/22 Potential to Achieve Goals: Good Progress towards PT goals: Progressing toward goals    Frequency    Min 1X/week      PT Plan Current plan remains appropriate    Co-evaluation              AM-PAC PT "6 Clicks" Mobility  Outcome Measure  Help needed turning from your back to your side while in a flat bed without using bedrails?: None Help needed moving from lying on your back to sitting on the side of a flat bed without using bedrails?: None Help needed moving to and from a bed to a chair (including a wheelchair)?: A Little Help needed standing up from a chair using your arms (e.g., wheelchair or bedside chair)?: A Little Help needed to walk in hospital room?: A Little Help needed climbing 3-5 steps with a railing? : A Little 6 Click Score: 20    End of Session Equipment Utilized During Treatment: Gait belt Activity Tolerance: Patient tolerated treatment well Patient left: with call bell/phone within reach;in bed;with nursing/sitter in room Nurse Communication: Mobility status PT Visit Diagnosis: Other abnormalities of gait and mobility (R26.89);Muscle weakness (generalized) (M62.81)     Time: 4098-1191 PT Time Calculation (min) (ACUTE ONLY): 20 min  Charges:    $Gait Training: 8-22 mins PT General Charges $$  ACUTE PT VISIT: 1 Visit                     Lewis Shock, PT, DPT Acute Rehabilitation Services Secure chat preferred Office #: [phone]    Iona Hansen 08/17/2022, 2:28 PM

## 2022-08-17 NOTE — Progress Notes (Incomplete)
PROGRESS NOTE    [name] [name]  ZOX:[id number] DOB: 1955/08/20 DOA: 08/08/2022 PCP: [facility] Records, FNP  67 y/o male with PMH of 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 Admitted for hypoxemic respiratory failure 2/2 acute HFrEF exacerbation and transferred to [facility] for progressive BiV systolic heart failure and low output state, CHF team foll, diuresing on Iv lasix and milrinone  Subjective:   Assessment and Plan:  Acute on chronic systolic HF Severe BiV Failure, Cardiogenic shock Pulm HTN Mod to Severe MR -Echo with EF <20%, global hypokinesis, RV systolic function with severe reduction, moderate to severe mitral regurgitation, moderate to severe tricuspid regurgitation. -RHC 7/25  Elevated filling pressures with preserved CO on milrinone  -CHF team foll, on Iv lasix and milrinone -Not a candidate for advanced therapies -Being weaned off milrinone today -High risk of readmission, limited life expectancy, seen by palliative care, wishes to remain full code with full scope of Rx  PAF (paroxysmal atrial fibrillation) (HCC) Episodic NSVT.  On Amio, now back in NSR Anticoagulation with apixaban.   Acute kidney injury superimposed on chronic kidney disease (HCC) CKD stage 3b. Hypokalemia, hyponatremia. -baseline creat 1.8, peaked ar 3, now 2.6, on milrinone  Type 2 diabetes mellitus with hyperlipidemia (HCC) Uncontrolled T2DM with hyperglycemia.  -on SSI.   Hx of apical thrombus first seen in 2021 -recent TTE without thrombus, continue anticoagulation for atrial fibrillation anyway  Severe OSA/HTN -continue CPAP nightly  Tobacco use disorder -Cessation counseling provided. -Nicotine patch has been ordered.  History of DVT (deep vein thrombosis) Anticoagulation  with apixaban.   Hypothyroidism Continue with levothyroxine.    DVT prophylaxis: apixaban Code Status: Full Code Family Communication: None present Disposition Plan: Home likely 1 to 2 days  Consultants: CHF team   Procedures:   Antimicrobials:    Objective: Vitals:   08/17/22 1300 08/17/22 1400 08/17/22 1500 08/17/22 1715  BP: 114/68 121/72  115/78  Pulse: 95 76 92   Resp: (!) 32 (!) 30 (!) 25   Temp:    98 F (36.7 C)  TempSrc:    Oral  SpO2: 90% 98% 98%   Weight:      Height:        Intake/Output Summary (Last 24 hours) at 08/17/2022 1720 Last data filed at 08/17/2022 1500 Gross per 24 hour  Intake 1345.27 ml  Output 3375 ml  Net -2029.73 ml   Filed Weights   08/15/22 0500 08/16/22 0500 08/17/22 0500  Weight: 77.5 kg 77.8 kg 75.6 kg    Examination: e.     Data Reviewed:   CBC: Recent Labs  Lab 08/13/22 0540 08/14/22 0330 08/15/22 0445 08/16/22 0404 08/17/22 0246  WBC 6.8 7.7 8.0 7.0 6.5  HGB 11.9* 12.2* 10.9* 11.0* 11.3*  HCT 37.2* 38.4* 33.3* 34.3* 35.9*  MCV 98.7 98.0 96.8 98.3 98.4  PLT 242 238 225 217 239   Basic Metabolic Panel: Recent Labs  Lab 08/13/22 0540 08/14/22 0330 08/14/22 2002 08/15/22 0445 08/15/22 1525 08/16/22 0404 08/17/22 0246  NA 134* 129* 131* 132* 135 135 140  K 3.7 5.8* 4.3 3.7 3.8 3.3* 3.4*  CL 93* 91* 91* 91* 91* 94* 97*  CO2 29 27 28 28 29 31  32  GLUCOSE 172* 274* 113* 136* 120* 123* 150*  BUN 40*  50* 61* 61* 62* 59* 57*  CREATININE 2.17* 2.53* 3.00* 2.75* 2.86* 2.71* 2.58*  CALCIUM 8.6* 8.8* 8.8* 8.6* 8.4* 8.4* 8.5*  MG 2.2 2.3  --  2.4  --  2.2 2.3   GFR: Estimated Creatinine Clearance: 30.5 mL/min (A) (by C-G formula based on SCr of 2.58 mg/dL (H)). Liver Function Tests: Recent Labs  Lab 08/10/22 2120  AST 13*  ALT 15  ALKPHOS 92  BILITOT 0.2*  PROT 6.3*  ALBUMIN 2.7*   No results for input(s): "LIPASE", "AMYLASE" in the last 168 hours. No results for input(s): "AMMONIA" in the last 168  hours. Coagulation Profile: No results for input(s): "INR", "PROTIME" in the last 168 hours. Cardiac Enzymes: No results for input(s): "CKTOTAL", "CKMB", "CKMBINDEX", "TROPONINI" in the last 168 hours. BNP (last 3 results) No results for input(s): "PROBNP" in the last 8760 hours. HbA1C: No results for input(s): "HGBA1C" in the last 72 hours. CBG: Recent Labs  Lab 08/16/22 1117 08/16/22 1620 08/16/22 2355 08/17/22 0727 08/17/22 1120  GLUCAP 120* 213* 127* 209* 110*   Lipid Profile: No results for input(s): "CHOL", "HDL", "LDLCALC", "TRIG", "CHOLHDL", "LDLDIRECT" in the last 72 hours. Thyroid Function Tests: No results for input(s): "TSH", "T4TOTAL", "FREET4", "T3FREE", "THYROIDAB" in the last 72 hours. Anemia Panel: No results for input(s): "VITAMINB12", "FOLATE", "FERRITIN", "TIBC", "IRON", "RETICCTPCT" in the last 72 hours. Urine analysis:    Component Value Date/Time   COLORURINE YELLOW 06/28/2006 1102   APPEARANCEUR CLEAR 06/28/2006 1102   LABSPEC 1.020 06/28/2006 1102   PHURINE 6.0 06/28/2006 1102   GLUCOSEU NEGATIVE 06/28/2006 1102   HGBUR NEGATIVE 06/28/2006 1102   BILIRUBINUR NEGATIVE 06/28/2006 1102   KETONESUR NEGATIVE 06/28/2006 1102   PROTEINUR NEGATIVE 06/28/2006 1102   UROBILINOGEN 1.0 06/28/2006 1102   NITRITE NEGATIVE 06/28/2006 1102   LEUKOCYTESUR  06/28/2006 1102    NEGATIVE MICROSCOPIC NOT DONE ON URINES WITH NEGATIVE PROTEIN, BLOOD, LEUKOCYTES, NITRITE, OR GLUCOSE <1000 mg/dL.   Sepsis Labs: @LABRCNTIP (procalcitonin:4,lacticidven:4)  ) Recent Results (from the past 240 hour(s))  Resp panel by RT-PCR (RSV, Flu A&B, Covid) Anterior Nasal Swab     Status: None   Collection Time: 08/08/22 12:00 PM   Specimen: Anterior Nasal Swab  Result Value Ref Range Status   SARS Coronavirus 2 by RT PCR NEGATIVE NEGATIVE Final    Comment: (NOTE) SARS-CoV-2 target nucleic acids are NOT DETECTED.  The SARS-CoV-2 RNA is generally detectable in upper  respiratory specimens during the acute phase of infection. The lowest concentration of SARS-CoV-2 viral copies this assay can detect is 138 copies/mL. A negative result does not preclude SARS-Cov-2 infection and should not be used as the sole basis for treatment or other patient management decisions. A negative result may occur with  improper specimen collection/handling, submission of specimen other than nasopharyngeal swab, presence of viral mutation(s) within the areas targeted by this assay, and inadequate number of viral copies(<138 copies/mL). A negative result must be combined with clinical observations, patient history, and epidemiological information. The expected result is Negative.  Fact Sheet for Patients:  BloggerCourse.com  Fact Sheet for Healthcare Providers:  SeriousBroker.it  This test is no t yet approved or cleared by the [place] FDA and  has been authorized for detection and/or diagnosis of SARS-CoV-2 by FDA under an Emergency Use Authorization (EUA). This EUA will remain  in effect (meaning this test can be used) for the duration of the COVID-19 declaration under Section 564(b)(1) of the Act, 21 U.S.C.section 360bbb-3(b)(1), unless the authorization is  terminated  or revoked sooner.       Influenza A by PCR NEGATIVE NEGATIVE Final   Influenza B by PCR NEGATIVE NEGATIVE Final    Comment: (NOTE) The Xpert Xpress SARS-CoV-2/FLU/RSV plus assay is intended as an aid in the diagnosis of influenza from Nasopharyngeal swab specimens and should not be used as a sole basis for treatment. Nasal washings and aspirates are unacceptable for Xpert Xpress SARS-CoV-2/FLU/RSV testing.  Fact Sheet for Patients: BloggerCourse.com  Fact Sheet for Healthcare Providers: SeriousBroker.it  This test is not yet approved or cleared by the [place] FDA and has been  authorized for detection and/or diagnosis of SARS-CoV-2 by FDA under an Emergency Use Authorization (EUA). This EUA will remain in effect (meaning this test can be used) for the duration of the COVID-19 declaration under Section 564(b)(1) of the Act, 21 U.S.C. section 360bbb-3(b)(1), unless the authorization is terminated or revoked.     Resp Syncytial Virus by PCR NEGATIVE NEGATIVE Final    Comment: (NOTE) Fact Sheet for Patients: BloggerCourse.com  Fact Sheet for Healthcare Providers: SeriousBroker.it  This test is not yet approved or cleared by the [place] FDA and has been authorized for detection and/or diagnosis of SARS-CoV-2 by FDA under an Emergency Use Authorization (EUA). This EUA will remain in effect (meaning this test can be used) for the duration of the COVID-19 declaration under Section 564(b)(1) of the Act, 21 U.S.C. section 360bbb-3(b)(1), unless the authorization is terminated or revoked.  Performed at [facility], [address], [place], [place] [address]   MRSA Next Gen by PCR, Nasal     Status: None   Collection Time: 08/08/22 10:50 PM   Specimen: Nasal Mucosa; Nasal Swab  Result Value Ref Range Status   MRSA by PCR Next Gen NOT DETECTED NOT DETECTED Final    Comment: (NOTE) The GeneXpert MRSA Assay (FDA approved for NASAL specimens only), is one component of a comprehensive MRSA colonization surveillance program. It is not intended to diagnose MRSA infection nor to guide or monitor treatment for MRSA infections. Test performance is not FDA approved in patients less than 23 years old. Performed at [facility], [address], [place], [place] [address]      Radiology Studies: DG CHEST PORT 1 VIEW  Result Date: 08/16/2022 CLINICAL DATA:  Evaluate central venous catheter placement EXAM: PORTABLE CHEST 1 VIEW COMPARISON:  08/14/2022 FINDINGS: There is a right IJ catheter with tip in the projection of  the SVC. No pneumothorax visualized. Stable cardiomediastinal contours. Bilateral pleural effusions are identified, right greater than left. These appear similar to the previous exam. Mild interstitial prominence is also unchanged. IMPRESSION: 1. Right IJ catheter with tip in the projection of the SVC. No pneumothorax visualized. 2. No change in veil like opacification over bilateral lungs compatible with posterior layering pleural effusions. Electronically Signed   By: Signa Kell M.D.   On: 08/16/2022 12:16     Scheduled Meds:  apixaban  5 mg Oral BID   aspirin EC  81 mg Oral Q0600   Chlorhexidine Gluconate Cloth  6 each Topical Q0600   dextromethorphan-guaiFENesin  1 tablet Oral BID   furosemide  80 mg Intravenous BID   gabapentin  100 mg Oral TID   insulin aspart  0-5 Units Subcutaneous QHS   insulin aspart  0-9 Units Subcutaneous TID WC   levothyroxine  137 mcg Oral q morning   magnesium oxide  400 mg Oral Daily   nicotine  14 mg Transdermal Daily  pantoprazole  40 mg Oral Daily   potassium chloride  40 mEq Oral BID   rosuvastatin  20 mg Oral Daily   tamsulosin  0.4 mg Oral Daily   Continuous Infusions:  amiodarone 30 mg/hr (08/17/22 1500)   milrinone 0.125 mcg/kg/min (08/17/22 1500)     LOS: 9 days    Time spent:    Zannie Cove, MD [facility]   08/17/2022, 5:20 PM

## 2022-08-17 NOTE — TOC Benefit Eligibility Note (Signed)
Pharmacy Patient Advocate Encounter  Insurance verification completed.    The patient is insured through [employer] for [employer]. Currently a quantity of 60 is a 30 day supply and the co-pay is $161.76 .   Ran test claim for [employer]. Currently a quantity of 30 is a 30 day supply and the co-pay is $143.69 .   Ran test claim for [place]. Currently a quantity of 30 is a 30 day supply and the co-pay is $136.90 .  Patient is in the coverage gap  This test claim was processed through [facility]- copay amounts may vary at other pharmacies due to pharmacy/plan contracts, or as the patient moves through the different stages of their insurance plan.

## 2022-08-17 NOTE — Progress Notes (Signed)
Progress Note   Patient: [name] [name] CZY:[id number] DOB: Nov 22, 1955 DOA: 08/08/2022     9 DOS: the patient was seen and examined on 08/17/2022   Brief hospital course: Mr. Ketch was admitted to the hospital with the working diagnosis of heart failure exacerbation.   67 y.o. male with medical history significant of type 2 diabetes with nephropathy, hypertension, hyperlipidemia, paroxysmal atrial fibrillation on chronic Eliquis, chronic systolic heart failure and chronic kidney disease stage IIIa; who presented to the emergency department secondary to increased shortness of breath and lower extremity swelling.  Patient reports missing couple doses of his Lasix and over the last week prior to admission has noticed increased shortness of breath with exertion and also orthopnea.  Patient expressed dry coughing spells and has noticed increase in his abdominal girth.  No chest pain, no nausea, no vomiting, no dysuria, no hematuria, no focal weaknesses, no sick contacts or any other complaints. Workup in the ED with a chest x-ray demonstrating vascular congestion and chronic bilateral pleural effusion suggesting congestive heart failure; elevated BNP (>4500) and positive crackles on physical exam.  COVID PCR negative.   IV Lasix has been provided and [facility] contacted to place patient in the hospital for further evaluation and management of acute on chronic heart failure exacerbation.  Patient with low output heart failure/ cardiogenic shock.  07/23 Central line was placed and was started on milrinone infusion. Transferred to [facility] from [facility].    07/26 stopped milrinone.  07/27 patient with worsening perfusion, back on milrinone. Transferred to ICU. 07/28 improved hemodynamics on milrinone 0.25  07/30 milrinone down to 0,125 mcg/ kg/min. Telemetry on sinus rhythm.   Assessment and Plan: * Acute on chronic systolic HF (heart failure) (HCC) Echocardiogram with reduced LV systolic function with EF <20%,  global hypokinesis, moderate to severe dilatation of LV cavity, no LVH, RV systolic function with severe reduction, moderate enlargement of RV cavity, RVSP 50.0, LA and RA with moderate dilatation, moderate to severe mitral regurgitation, moderate to severe tricuspid regurgitation.  Biventricular failure. Acute on chronic core pulmonale. Pulmonary hypertension.  Low output cardiac failure, cardiogenic shock.  07/25 cardiac catheterization. PA 58/32 mean 42 PCWP 34  Cardiac output 5,3 and index 2.6 (Fick). PVR 1.5 W  Urine output is 4,460  ml Systolic blood pressure 114 to 128 mmHg.  SV02 down to 63.4  Telemetry personally reviewed, with sinus rhythm rate 80 bpm with frequent PAC.   Inotropic support with milrinone, now at  0.125 mcg per Kg/min  Furosemide 80 mg IV q12 hrs  Limited pharmacologic therapy due to risk of hypotension.   PAF (paroxysmal atrial fibrillation) (HCC) Episodic NSVT.  Telemetry back on sinus rhythm with frequent PAC. Continue amiodarone load.  Anticoagulation with apixaban.    Acute kidney injury superimposed on chronic kidney disease (HCC) CKD stage 3b. Hypokalemia, hyponatremia.  Renal function with serum cr at 2,58, K is 3,4 and serum bicarbonate at 32. Na 140. Mg 2.3   Continue K correction with Kcl. Diuresis with furosemide 80 mg IV q12 Follow up renal function and electrolytes in am.   Type 2 diabetes mellitus with hyperlipidemia (HCC) Uncontrolled T2DM with hyperglycemia.   Continue insulin sliding scale for glucose cover and monitoring.  Fasting glucose today is 150 mg/dl.  On gabapentin for neuropathy. Continue with statin therapy.   Tobacco use disorder -Cessation counseling provided. -Nicotine patch has been ordered.  History of DVT (deep vein thrombosis) Anticoagulation with apixaban.   Hypothyroidism Continue with levothyroxine.  Subjective: Patient is feeling better, no chest pain, no dyspnea, no PND or orthopnea.    Physical Exam: Vitals:   08/17/22 0600 08/17/22 0700 08/17/22 0725 08/17/22 0800  BP: (!) 130/99 137/84  128/89  Pulse: 79 80  88  Resp: (!) 22 19  (!) 25  Temp:   98.3 F (36.8 C)   TempSrc:   Oral   SpO2:  (!) 85%  96%  Weight:      Height:       Neurology awake and alert ENT with mild pallor Cardiovascular with S1 and S2 present and regular with no gallops, or rubs, positive systolic murmur at the apex.  No JVD No lower extremity edema.  Respiratory with no rales or wheezing, no rhonchi Abdomen with no distention  Data Reviewed:    Family Communication: no family at the bedside   Disposition: Status is: Inpatient Remains inpatient appropriate because: recovering cardiogenic shock   Planned Discharge Destination: Home      Author: Coralie Keens, MD 08/17/2022 8:50 AM  For on call review [email or web address].

## 2022-08-17 NOTE — Progress Notes (Signed)
Primary RN placed IV team consult that CVC dressing needed changing because she received received patient from another unit. Discussed with primary RN that 4E is a self-maintained unit, primary RN states she will change dressing. Also discussed that occluded port has already been addressed by different IV team member earlier in the day and unable to TPA that particular port.   At this point, if central acces remains an issue may consider PICC or other alternative access.

## 2022-08-17 NOTE — TOC Progression Note (Signed)
Transition of Care [facility]) - Progression Note    Patient Details  Name: [name] [name] MRN: [id number] Date of Birth: Sep 18, 1955  [facility] CM/SW Contact  Elliot Cousin, RN Phone Number: [phone] 08/17/2022, 10:21 AM  Clinical Narrative:   Referral sent for HF Paramedicine to follow up in community. Pt has declined HH several times. Will continue to follow for dc needs.     Expected Discharge Plan: Home/Self Care Barriers to Discharge: Continued Medical Work up  Expected Discharge Plan and Services In-house Referral: Clinical Social Work     Living arrangements for the past 2 months: Single Family Home                           HH Arranged: Patient Refused [facility]           Social Determinants of Health (SDOH) Interventions SDOH Screenings   Food Insecurity: No Food Insecurity (08/12/2022)  Housing: Low Risk  (04/20/2022)  Transportation Needs: No Transportation Needs (08/12/2022)  Utilities: Not At Risk (08/12/2022)  Alcohol Screen: Low Risk  (04/07/2022)  Depression (PHQ2-9): High Risk (04/30/2022)  Financial Resource Strain: Medium Risk (04/07/2022)  Physical Activity: Inactive (04/07/2022)  Social Connections: Moderately Isolated (04/07/2022)  Stress: No Stress Concern Present (04/07/2022)  Tobacco Use: High Risk (08/08/2022)    Readmission Risk Interventions    08/09/2022    8:24 AM 04/20/2022   11:07 AM 02/10/2022    2:42 PM  Readmission Risk Prevention Plan  Transportation Screening Complete Complete Complete  PCP or Specialist Appt within 3-5 Days   --  HRI or Home Care Consult  Complete Complete  Social Work Consult for Recovery Care Planning/Counseling  Complete Complete  Palliative Care Screening  Not Applicable Not Applicable  Medication Review Oceanographer) Complete Complete Complete  HRI or Home Care Consult Complete    SW Recovery Care/Counseling Consult Complete    Palliative Care Screening Not Applicable    Skilled Nursing  Facility Not Applicable

## 2022-08-18 ENCOUNTER — Other Ambulatory Visit (HOSPITAL_COMMUNITY): Payer: Self-pay

## 2022-08-18 ENCOUNTER — Ambulatory Visit: Payer: Medicare HMO | Admitting: Family Medicine

## 2022-08-18 DIAGNOSIS — I5023 Acute on chronic systolic (congestive) heart failure: Secondary | ICD-10-CM | POA: Diagnosis not present

## 2022-08-18 LAB — GLUCOSE, CAPILLARY
Glucose-Capillary: 195 mg/dL — ABNORMAL HIGH (ref 70–99)
Glucose-Capillary: 158 mg/dL — ABNORMAL HIGH (ref 70–99)
Glucose-Capillary: 197 mg/dL — ABNORMAL HIGH (ref 70–99)
Glucose-Capillary: 214 mg/dL — ABNORMAL HIGH (ref 70–99)

## 2022-08-18 MED ORDER — TORSEMIDE 20 MG PO TABS
60.0000 mg | ORAL_TABLET | Freq: Every day | ORAL | Status: DC
  Administered 2022-08-18 – 2022-08-19 (×2): 60 mg via ORAL
  Filled 2022-08-18 (×2): qty 3

## 2022-08-18 MED ORDER — EMPAGLIFLOZIN 10 MG PO TABS
10.0000 mg | ORAL_TABLET | Freq: Every day | ORAL | Status: DC
  Administered 2022-08-18 – 2022-08-19 (×2): 10 mg via ORAL
  Filled 2022-08-18 (×2): qty 1

## 2022-08-18 MED ORDER — POTASSIUM CHLORIDE CRYS ER 20 MEQ PO TBCR
40.0000 meq | EXTENDED_RELEASE_TABLET | Freq: Once | ORAL | Status: AC
  Administered 2022-08-18: 40 meq via ORAL
  Filled 2022-08-18: qty 2

## 2022-08-18 MED ORDER — AMIODARONE HCL 200 MG PO TABS
200.0000 mg | ORAL_TABLET | Freq: Two times a day (BID) | ORAL | Status: DC
  Administered 2022-08-18 – 2022-08-19 (×3): 200 mg via ORAL
  Filled 2022-08-18 (×3): qty 1

## 2022-08-18 NOTE — Progress Notes (Addendum)
Advanced Heart Failure Rounding Note  PCP-Cardiologist: Dina Rich, MD   Subjective:    CO-OX 55% on milrinone 0.125 with calculated Fick CI of 1.8.  3.4L UOP yesterday with IV lasix. CVP 5.  Scr further improved to 2.1  Maintaining SR on IV amio.  Feeling well. No concerns.  Objective:   Weight Range: 76.1 kg Body mass index is 22.74 kg/m.   Vital Signs:   Temp:  [97.6 F (36.4 C)-98.3 F (36.8 C)] 97.6 F (36.4 C) (07/31 0741) Pulse Rate:  [68-95] 77 (07/31 0741) Resp:  [13-32] 20 (07/31 0741) BP: (106-121)/(67-90) 107/67 (07/31 0741) SpO2:  [90 %-98 %] 94 % (07/31 0741) Weight:  [76.1 kg] 76.1 kg (07/31 0607) Last BM Date : 08/15/22  Weight change: Filed Weights   08/16/22 0500 08/17/22 0500 08/18/22 0607  Weight: 77.8 kg 75.6 kg 76.1 kg    Intake/Output:   Intake/Output Summary (Last 24 hours) at 08/18/2022 0959 Last data filed at 08/18/2022 0744 Gross per 24 hour  Intake 1156.09 ml  Output 3350 ml  Net -2193.91 ml     CVP 7 Physical Exam  General:  Chronically ill appearing. HEENT: normal Neck: supple. JVP 7-8. R internal jugular CVC. Carotids 2+ bilat; no bruits.  Cor: PMI nondisplaced. Regular rate & rhythm. No rubs, gallops or murmurs. Lungs: clear Abdomen: soft, nontender, nondistended.  Extremities: no cyanosis, clubbing, rash, edema Neuro: alert & orientedx3. Affect pleasant     Telemetry   SR 80s  EKG    N/A   Labs    CBC Recent Labs    08/16/22 0404 08/17/22 0246  WBC 7.0 6.5  HGB 11.0* 11.3*  HCT 34.3* 35.9*  MCV 98.3 98.4  PLT 217 239   Basic Metabolic Panel Recent Labs    32/44/01 0246 08/18/22 0411  NA 140 137  K 3.4* 3.4*  CL 97* 96*  CO2 32 30  GLUCOSE 150* 130*  BUN 57* 55*  CREATININE 2.58* 2.13*  CALCIUM 8.5* 8.6*  MG 2.3 2.3   Liver Function Tests No results for input(s): "AST", "ALT", "ALKPHOS", "BILITOT", "PROT", "ALBUMIN" in the last 72 hours.  No results for input(s): "LIPASE",  "AMYLASE" in the last 72 hours. Cardiac Enzymes No results for input(s): "CKTOTAL", "CKMB", "CKMBINDEX", "TROPONINI" in the last 72 hours.  BNP: BNP (last 3 results) Recent Labs    04/23/22 0638 08/08/22 1211 08/10/22 0926  BNP 1,260.0* >4,500.0* 2,475.0*    ProBNP (last 3 results) No results for input(s): "PROBNP" in the last 8760 hours.   D-Dimer No results for input(s): "DDIMER" in the last 72 hours. Hemoglobin A1C No results for input(s): "HGBA1C" in the last 72 hours.  Fasting Lipid Panel No results for input(s): "CHOL", "HDL", "LDLCALC", "TRIG", "CHOLHDL", "LDLDIRECT" in the last 72 hours. Thyroid Function Tests No results for input(s): "TSH", "T4TOTAL", "T3FREE", "THYROIDAB" in the last 72 hours.  Invalid input(s): "FREET3"   Other results:   Imaging    No results found.   Medications:     Scheduled Medications:  apixaban  5 mg Oral BID   aspirin EC  81 mg Oral Q0600   Chlorhexidine Gluconate Cloth  6 each Topical Q0600   dextromethorphan-guaiFENesin  1 tablet Oral BID   gabapentin  100 mg Oral TID   insulin aspart  0-5 Units Subcutaneous QHS   insulin aspart  0-9 Units Subcutaneous TID WC   levothyroxine  137 mcg Oral q morning   magnesium oxide  400 mg Oral  Daily   nicotine  14 mg Transdermal Daily   pantoprazole  40 mg Oral Daily   potassium chloride  40 mEq Oral BID   rosuvastatin  20 mg Oral Daily   tamsulosin  0.4 mg Oral Daily    Infusions:  amiodarone 30 mg/hr (08/18/22 0437)   milrinone 0.125 mcg/kg/min (08/18/22 0437)    PRN Medications: acetaminophen **OR** acetaminophen, acetaminophen, ondansetron **OR** ondansetron (ZOFRAN) IV, ondansetron (ZOFRAN) IV, mouth rinse, oxyCODONE    Patient Profile   67 y/o male with PMH of HFrEF 2/2 NICM (EF <20%), hx of LV apical thrombus, OSA, PAD, pAF, atypical RBBB, HTN, T2DM, HLD, CKD Stage IIIb, and MR who presented to [facility] on 08/08/2022 with progressive dyspnea, orthopnea, and peripheral  edema after missing doses of diuretic. Admitted for hypoxemic respiratory failure 2/2 acute HFrEF exacerbation and transferred to [facility] for progressive BiV systolic heart failure and low output state.   Assessment/Plan   1. SCAI C Cardiogenic Shock Severe biventricular failure on TTE from 08/09/22; likely exacerbated by medication non-compliance & progressive low output HF. AF may have also contributed. Initial mixed venous 33.7% at [facility], Fick CI of 1.2 L/min/m2.  RHC 07/25: Elevated filling pressures with preserved CO on milrinone Lactic acid cleared 2.1>1. CO-OX 55% on milrinone 0.125 with Fick CI 1.8. Will stop milrinone and see how he does. He would not be a candidate for home inotrope.  CVP 5. Start po Torsemide 60 mg daily (on lasix 40 BID prior to admission).  Start Jardiance 10 mg daily. Patient assistance paperwork started.  No BP room for other GDMT No beta blocker with low-output Not a candidate for advanced therapies at this time. RV failure and multiple comorbid conditions preclude him from LVAD or transplant. 2. Paroxysmal A-fib In and out of Afib this admit Converted to SR 07/29 with amiodarone gtt. Switch to PO amio 200 mg BID Back on Eliquis 5 mg BID 4. AKI on CKD stage IIIb Cr peak 3 and down to 2.1. Baseline 1.8.  Monitor 5. Progressive mod/severe MR and new mod/severe TR Not sure if he would be a great candidate for mTEER Could consider TEE as outpatient depending on his progress 6. Hx of apical thrombus first seen in 2021 Most recent TTE without thrombus, he will continue on anticoagulation for atrial fibrillation anyway 7. OSA/HTN Severe OSA on outpatient sleep study CPAP nightly 8. PAD Acute limb ischemia 01/24 in setting of noncompliance with eliquis. S/p RLE thrombectomy, R TP trunk endarterectomy On aspirin, eliquis and statin 9. Tobacco use disorder Encourage smoking cessation 10. T2DM/Hypothyroidism Per primary   Palliative care consulted for GOC  discussion. He wishes to remain full code.   SDOH:  -Med compliance will be an issue. Reports trouble affording some of his medications. PhamD reviewing patient assistance options. -Noncompliance with clinic f/u. TOC CM following to assist with barriers to f/u -Paramedicine referral submitted. Not sure he will agree to this.        Length of Stay: 10  [name] [name], [name] Garnet, PA-C  08/18/2022, 9:59 AM  Advanced Heart Failure Team Pager [phone] (M-F; 7a - 5p)  Please contact [facility] for night-coverage after hours (5p -7a ) and weekends on [email or web address]

## 2022-08-18 NOTE — Progress Notes (Signed)
Mobility Specialist Progress Note:   08/18/22 1215  Mobility  Activity Ambulated with assistance in hallway  Level of Assistance Contact guard assist, steadying assist  Assistive Device  (IV Pole)  Distance Ambulated (ft) 350 ft  Activity Response Tolerated well  Mobility Referral Yes  $Mobility charge 1 Mobility  Mobility Specialist Start Time (ACUTE ONLY) 1150  Mobility Specialist Stop Time (ACUTE ONLY) 1212  Mobility Specialist Time Calculation (min) (ACUTE ONLY) 22 min    Pre Mobility: 81 HR , 114/75 BP , 99% SpO2 During Mobility: 85 HR  Post Mobility: 88 HR   Pt received in bed, agreeable to mobility. C/o discomfort in R. Foot during ambulation, otherwise asymptomatic throughout. Pt returned to bed with all needs met. NT present in room.   Leory Plowman  Mobility Specialist Please contact via [employer] office at [phone]

## 2022-08-19 ENCOUNTER — Other Ambulatory Visit (HOSPITAL_COMMUNITY): Payer: Self-pay

## 2022-08-19 DIAGNOSIS — I5023 Acute on chronic systolic (congestive) heart failure: Secondary | ICD-10-CM | POA: Diagnosis not present

## 2022-08-19 LAB — GLUCOSE, CAPILLARY
Glucose-Capillary: 157 mg/dL — ABNORMAL HIGH (ref 70–99)
Glucose-Capillary: 111 mg/dL — ABNORMAL HIGH (ref 70–99)

## 2022-08-19 MED ORDER — EMPAGLIFLOZIN 10 MG PO TABS
10.0000 mg | ORAL_TABLET | Freq: Every day | ORAL | 0 refills | Status: DC
Start: 2022-08-20 — End: 2023-01-04
  Filled 2022-08-19: qty 30, 30d supply, fill #0

## 2022-08-19 MED ORDER — TORSEMIDE 20 MG PO TABS
ORAL_TABLET | ORAL | 0 refills | Status: DC
  Filled 2022-08-19 (×2): qty 150, 30d supply, fill #0

## 2022-08-19 MED ORDER — POTASSIUM CHLORIDE CRYS ER 20 MEQ PO TBCR
60.0000 meq | EXTENDED_RELEASE_TABLET | Freq: Two times a day (BID) | ORAL | 1 refills | Status: AC
  Filled 2022-08-19: qty 180, 30d supply, fill #0

## 2022-08-19 MED ORDER — POTASSIUM CHLORIDE CRYS ER 20 MEQ PO TBCR
60.0000 meq | EXTENDED_RELEASE_TABLET | Freq: Once | ORAL | Status: AC
  Administered 2022-08-19: 60 meq via ORAL
  Filled 2022-08-19: qty 3

## 2022-08-19 MED ORDER — TORSEMIDE 20 MG PO TABS: 20.0000 mg | ORAL_TABLET | Freq: Every day | ORAL | Status: DC

## 2022-08-19 MED ORDER — ROSUVASTATIN CALCIUM 20 MG PO TABS
20.0000 mg | ORAL_TABLET | Freq: Every day | ORAL | 0 refills | Status: DC
  Filled 2022-08-19: qty 30, 30d supply, fill #0

## 2022-08-19 MED ORDER — TORSEMIDE 20 MG PO TABS
60.0000 mg | ORAL_TABLET | Freq: Every day | ORAL | 0 refills | Status: DC
  Filled 2022-08-19: qty 90, 30d supply, fill #0

## 2022-08-19 MED ORDER — GABAPENTIN 100 MG PO CAPS
100.0000 mg | ORAL_CAPSULE | Freq: Three times a day (TID) | ORAL | 0 refills | Status: DC
  Filled 2022-08-19: qty 90, 30d supply, fill #0

## 2022-08-19 MED ORDER — AMIODARONE HCL 200 MG PO TABS
200.0000 mg | ORAL_TABLET | Freq: Two times a day (BID) | ORAL | 0 refills | Status: DC
  Filled 2022-08-19: qty 60, 30d supply, fill #0

## 2022-08-19 MED ORDER — TORSEMIDE 20 MG PO TABS: 80.0000 mg | ORAL_TABLET | Freq: Every day | ORAL | Status: DC

## 2022-08-19 MED ORDER — POTASSIUM CHLORIDE CRYS ER 20 MEQ PO TBCR: 40.0000 meq | EXTENDED_RELEASE_TABLET | Freq: Once | ORAL | Status: DC

## 2022-08-19 MED ORDER — FUROSEMIDE 10 MG/ML IJ SOLN
80.0000 mg | Freq: Once | INTRAMUSCULAR | Status: AC
  Administered 2022-08-19: 80 mg via INTRAVENOUS
  Filled 2022-08-19: qty 8

## 2022-08-19 NOTE — TOC Progression Note (Signed)
Transition of Care [facility]) - Progression Note    Patient Details  Name: [name] [name] MRN: [id number] Date of Birth: [birth year]  Transition of Care [facility]) CM/SW Contact  Nicanor Bake Phone Number: [phone]  08/19/2022, 11:14 AM  Clinical Narrative: CSW met wit pt at bedside to inform the pt that his PCP appointment has been rescheduled for Wednesday, August 14th at 10:20 am. CSW also discussed and went over substance abuse resources with pt. TOC will continue following.      Expected Discharge Plan: Home/Self Care Barriers to Discharge: Continued Medical Work up  Expected Discharge Plan and Services In-house Referral: Clinical Social Work     Living arrangements for the past 2 months: Single Family Home                           HH Arranged: Patient Refused [facility]           Social Determinants of Health (SDOH) Interventions SDOH Screenings   Food Insecurity: No Food Insecurity (08/12/2022)  Housing: Low Risk  (04/20/2022)  Transportation Needs: No Transportation Needs (08/12/2022)  Utilities: Not At Risk (08/12/2022)  Alcohol Screen: Low Risk  (04/07/2022)  Depression (PHQ2-9): High Risk (04/30/2022)  Financial Resource Strain: Medium Risk (04/07/2022)  Physical Activity: Inactive (04/07/2022)  Social Connections: Moderately Isolated (04/07/2022)  Stress: No Stress Concern Present (04/07/2022)  Tobacco Use: High Risk (08/08/2022)    Readmission Risk Interventions    08/09/2022    8:24 AM 04/20/2022   11:07 AM 02/10/2022    2:42 PM  Readmission Risk Prevention Plan  Transportation Screening Complete Complete Complete  PCP or Specialist Appt within 3-5 Days   --  HRI or Home Care Consult  Complete Complete  Social Work Consult for Recovery Care Planning/Counseling  Complete Complete  Palliative Care Screening  Not Applicable Not Applicable  Medication Review Oceanographer) Complete Complete Complete  HRI or Home Care Consult Complete    SW Recovery  Care/Counseling Consult Complete    Palliative Care Screening Not Applicable    Skilled Nursing Facility Not Applicable

## 2022-08-19 NOTE — Progress Notes (Signed)
Advanced Heart Failure Rounding Note  PCP-Cardiologist: Dina Rich, MD   Subjective:    CO-OX 60% off milrinone.  Scr stable at 2.2. Good UOP with PO torsemide. CVP 13.  BP soft 90s-low 100s  Denies dyspnea. Ambulated 350 ft with PT today.   Objective:   Weight Range: 75.2 kg Body mass index is 22.47 kg/m.   Vital Signs:   Temp:  [97.7 F (36.5 C)-98.4 F (36.9 C)] 98.4 F (36.9 C) (08/01 0732) Pulse Rate:  [67-84] 74 (08/01 0732) Resp:  [11-20] 14 (08/01 0732) BP: (94-107)/(69-80) 104/69 (08/01 0732) SpO2:  [92 %-99 %] 97 % (08/01 0732) Weight:  [75.2 kg] 75.2 kg (08/01 0602) Last BM Date : 08/18/22  Weight change: Filed Weights   08/17/22 0500 08/18/22 0607 08/19/22 0602  Weight: 75.6 kg 76.1 kg 75.2 kg    Intake/Output:   Intake/Output Summary (Last 24 hours) at 08/19/2022 1106 Last data filed at 08/19/2022 0734 Gross per 24 hour  Intake 1287.74 ml  Output 2700 ml  Net -1412.26 ml     CVP 13 Physical Exam  General:  Appears older than stated age HEENT: normal Neck: supple. JVP 12-14. Carotids 2+ bilat; no bruits.  Cor: PMI nondisplaced. Regular rate & rhythm. No rubs, gallops or murmurs. Lungs: clear Abdomen: soft, nontender, nondistended.  Extremities: no cyanosis, clubbing, rash, edema Neuro: alert & orientedx3. Affect pleasant   Telemetry   SR 70s, 30 beat run NSVT  EKG    N/A   Labs    CBC Recent Labs    08/17/22 0246  WBC 6.5  HGB 11.3*  HCT 35.9*  MCV 98.4  PLT 239   Basic Metabolic Panel Recent Labs    16/10/96 0246 08/18/22 0411 08/19/22 0355  NA 140 137 137  K 3.4* 3.4* 3.8  CL 97* 96* 98  CO2 32 30 28  GLUCOSE 150* 130* 109*  BUN 57* 55* 55*  CREATININE 2.58* 2.13* 2.23*  CALCIUM 8.5* 8.6* 8.7*  MG 2.3 2.3  --    Liver Function Tests No results for input(s): "AST", "ALT", "ALKPHOS", "BILITOT", "PROT", "ALBUMIN" in the last 72 hours.  No results for input(s): "LIPASE", "AMYLASE" in the last 72  hours. Cardiac Enzymes No results for input(s): "CKTOTAL", "CKMB", "CKMBINDEX", "TROPONINI" in the last 72 hours.  BNP: BNP (last 3 results) Recent Labs    04/23/22 0638 08/08/22 1211 08/10/22 0926  BNP 1,260.0* >4,500.0* 2,475.0*    ProBNP (last 3 results) No results for input(s): "PROBNP" in the last 8760 hours.   D-Dimer No results for input(s): "DDIMER" in the last 72 hours. Hemoglobin A1C No results for input(s): "HGBA1C" in the last 72 hours.  Fasting Lipid Panel No results for input(s): "CHOL", "HDL", "LDLCALC", "TRIG", "CHOLHDL", "LDLDIRECT" in the last 72 hours. Thyroid Function Tests No results for input(s): "TSH", "T4TOTAL", "T3FREE", "THYROIDAB" in the last 72 hours.  Invalid input(s): "FREET3"   Other results:   Imaging    No results found.   Medications:     Scheduled Medications:  amiodarone  200 mg Oral BID   apixaban  5 mg Oral BID   aspirin EC  81 mg Oral Q0600   Chlorhexidine Gluconate Cloth  6 each Topical Q0600   dextromethorphan-guaiFENesin  1 tablet Oral BID   empagliflozin  10 mg Oral Daily   gabapentin  100 mg Oral TID   insulin aspart  0-5 Units Subcutaneous QHS   insulin aspart  0-9 Units Subcutaneous TID WC  levothyroxine  137 mcg Oral q morning   magnesium oxide  400 mg Oral Daily   nicotine  14 mg Transdermal Daily   pantoprazole  40 mg Oral Daily   potassium chloride  40 mEq Oral BID   rosuvastatin  20 mg Oral Daily   tamsulosin  0.4 mg Oral Daily   torsemide  60 mg Oral Daily    Infusions:    PRN Medications: acetaminophen **OR** acetaminophen, acetaminophen, ondansetron **OR** ondansetron (ZOFRAN) IV, ondansetron (ZOFRAN) IV, mouth rinse, oxyCODONE    Patient Profile   67 y/o male with PMH of HFrEF 2/2 NICM (EF <20%), hx of LV apical thrombus, OSA, PAD, pAF, atypical RBBB, HTN, T2DM, HLD, CKD Stage IIIb, and MR who presented to [facility] on 08/08/2022 with progressive dyspnea, orthopnea, and peripheral edema after  missing doses of diuretic. Admitted for hypoxemic respiratory failure 2/2 acute HFrEF exacerbation and transferred to [facility] for progressive BiV systolic heart failure and low output state.   Assessment/Plan   1. SCAI C Cardiogenic Shock Severe biventricular failure on TTE from 08/09/22; likely exacerbated by medication non-compliance & progressive low output HF. AF may have also contributed. Initial mixed venous 33.7% at [facility], Fick CI of 1.2 L/min/m2.  RHC 07/25: Elevated filling pressures with preserved CO on milrinone Lactic acid cleared 2.1>1. CO-OX 60% off milrinone.  CVP 13.  Give 80 mg lasix IV X 1. Discharge on Torsemide 80 mg qpm and 20 mg qpm Continue Jardiance 10 mg daily. Patient assistance paperwork started.  No BP room for other GDMT No beta blocker with low-output Not a candidate for advanced therapies at this time. RV failure and multiple comorbid conditions preclude him from LVAD or transplant. Remove central line 2. Paroxysmal A-fib In and out of Afib this admit Converted to SR 07/29 with amiodarone gtt. Continue po amiodarone 200 mg BID Back on Eliquis 5 mg BID 4. AKI on CKD stage IIIb Cr peak 3 and down to 2.2. Baseline 1.8.  Monitor 5. Progressive mod/severe MR and new mod/severe TR Not sure if he would be a great candidate for mTEER Could consider TEE as outpatient depending on his progress 6. Hx of apical thrombus first seen in 2021 Most recent TTE without thrombus, he will continue on anticoagulation for atrial fibrillation anyway 7. OSA/HTN Severe OSA on outpatient sleep study CPAP nightly 8. PAD Acute limb ischemia 01/24 in setting of noncompliance with eliquis. S/p RLE thrombectomy, R TP trunk endarterectomy On aspirin, eliquis and statin 9. Tobacco use disorder Encourage smoking cessation 10. T2DM/Hypothyroidism Per primary 11. NSVT  a. 30 beat run on tele this am  b. Supp K and Mag  c. Continue amiodarone  Palliative care consulted for GOC  discussion. He wishes to remain full code.   SDOH:  -Med compliance will be an issue. Reports trouble affording some of his medications. PhamD reviewing patient assistance options. -Noncompliance with clinic f/u. TOC CM following to assist with barriers to f/u -Paramedicine referral submitted. Not sure he will agree to this.   Has HF follow-up scheduled.  HF medications at discharge: Eliquis 5 mg BID Aspirin 81 mg daily Amiodarone 200 mg BID (decrease to 200 mg daily at follow-up) Jardiance 10 mg daily Torsemide 80 mg q am and 20 mg q pm Potassium chloride 60 mEq BID Rosuvastatin 20 mg daily   Length of Stay: 11  Shye Doty N, PA-C  08/19/2022, 11:06 AM  Advanced Heart Failure Team Pager [phone] (M-F; 7a - 5p)  Please contact  [facility] for night-coverage after hours (5p -7a ) and weekends on [email or web address]

## 2022-08-19 NOTE — Progress Notes (Signed)
Patient given discharge instructions. PIV removed. Telemetry box removed, CCMD notified. Patient taken in wheelchair to vehicle by staff.  Kenard Gower, RN

## 2022-08-19 NOTE — Progress Notes (Signed)
Outpatient Heart and Vascular Care Navigation  08/19/2022  [name] [name] 02/08/1955 [id number]  Reason for Referral: Outpatient HF CSW consulted to assess pt for paramedicine program.  Pt lives in [place] so would need referral to [facility].    Engaged with patient face to face for initial visit for Heart and Vascular Care Coordination.                                                                                                   Assessment: CSW met with pt at bedside to discuss referral to [facility].  Pt at first hesitant about people coming out to his house but CSW explained the program and the goal og the program to help him stay out of the hospital and pt became agreeable to try it.  Pt reports he lives at home with his wife and his stepson.  Reports some concerns with getting sufficient food- makes too much for any meaningful food stamp benefit.  Pt interested in getting [place] food pantry information- inpatient TOC CSW to follow up.  Pt also reported concerns with accessing necessary dental and eye care.  CSW sent message to [place] resource to inquire about assistance with this.                                       HRT/VAS Care Coordination     Patients Home Cardiology Office Heart Failure Clinic   Outpatient Care Team [facility] Paramedic Name: referred to [facility] paramedicine Aug 2024   Living arrangements for the past 2 months Single Family Home   Lives with: Spouse; Adult Children   Patient Current Insurance Coverage Managed Medicare   Does Patient Have Prescription Coverage? Yes   Home Assistive Devices/Equipment Walker (specify type)   Current home services DME  walker       Social History:                                                                             SDOH Screenings   Food Insecurity: No Food Insecurity (08/12/2022)  Housing: Low Risk  (04/20/2022)   Transportation Needs: No Transportation Needs (08/12/2022)  Utilities: Not At Risk (08/12/2022)  Alcohol Screen: Low Risk  (04/07/2022)  Depression (PHQ2-9): High Risk (04/30/2022)  Financial Resource Strain: Medium Risk (04/07/2022)  Physical Activity: Inactive (04/07/2022)  Social Connections: Moderately Isolated (04/07/2022)  Stress: No Stress Concern Present (04/07/2022)  Tobacco Use: High Risk (08/08/2022)    SDOH Interventions: Financial Resources:    Social Security retirement income  patient- $2,514 spouse- $1,800, stepson is over 80 and independent financially  Food Insecurity:  Reports sometimes running low- interested in [employer]  options  Housing Insecurity:  None reported  Transportation:   Sometimes an issue- car is unreliable.   Follow-up plan:    Referral will be sent to [facility] upon patient DC.  CSW will follow pt in Outpatient setting to assist as needed  Burna Sis, LCSW Clinical Social Worker Advanced Heart Failure Clinic Desk#: [phone] Cell#: [phone]

## 2022-08-19 NOTE — Discharge Summary (Signed)
Physician Discharge Summary  [name] [name]:[id number] DOB: 26-Sep-1955 DOA: 08/08/2022  PCP: Rica Records, FNP  Admit date: 08/08/2022 Discharge date: 08/19/2022  Time spent: 45 minutes  Recommendations for Outpatient Follow-up:  Advanced heart failure clinic 8/12, please check BMP at follow-up PCP 8/14 Para medicine referral sent, continue goals of care discussions   Discharge Diagnoses:  Principal Problem:   Acute on chronic systolic HF (heart failure) (HCC) Severe biventricular failure   PAF (paroxysmal atrial fibrillation) (HCC)   Acute kidney injury superimposed on chronic kidney disease (HCC)   Type 2 diabetes mellitus with hyperlipidemia (HCC)   Tobacco use disorder   History of DVT (deep vein thrombosis)   Hypothyroidism   Discharge Condition: Improved  Diet recommendation:, Heart healthy  Filed Weights   08/17/22 0500 08/18/22 0607 08/19/22 0602  Weight: 75.6 kg 76.1 kg 75.2 kg    History of present illness:  67 y/o male with PMH of 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 Admitted for hypoxemic respiratory failure 2/2 acute HFrEF exacerbation and transferred to [facility] for progressive BiV systolic heart failure and low output state, CHF team foll, diuresing on Iv lasix and milrinone   Hospital Course:   Acute on chronic systolic HF Severe BiV Failure, Cardiogenic shock Pulm HTN Mod to Severe MR -Echo with EF <20%, global hypokinesis, RV systolic function with severe reduction, moderate to severe mitral regurgitation, moderate to severe tricuspid regurgitation. -RHC 7/25  Elevated filling pressures with preserved CO on milrinone  -Followed by advanced heart failure team, diuresed aggressively on IV Lasix and milrinone , after multiple attempts was  weaned off milrinone yesterday -Not a candidate for advanced therapies -High risk of readmission, limited life expectancy, seen by palliative care, wishes to remain full code with full scope of Rx -Follow-up in heart failure clinic 8/12, needs BMP at FU   PAF (paroxysmal atrial fibrillation) (HCC) Episodic NSVT.  On Amio, now back in NSR Anticoagulation with apixaban.    Acute kidney injury superimposed on chronic kidney disease (HCC) CKD stage 3b. Hypokalemia, hyponatremia. -baseline creat 1.8, peaked ar 3, now 2.2   Type 2 diabetes mellitus with hyperlipidemia (HCC) -Improved now stable, continue Jardiance   Hx of apical thrombus first seen in 2021 -recent TTE without thrombus, continue anticoagulation for atrial fibrillation anyway   Severe OSA/HTN -continue CPAP nightly   Tobacco use disorder -Cessation counseling provided. -Nicotine patch    History of DVT (deep vein thrombosis) Anticoagulation with apixaban.    Hypothyroidism Continue with levothyroxine.      Discharge Exam: Vitals:   08/19/22 0732 08/19/22 1122  BP: 104/69 109/80  Pulse: 74 80  Resp: 14 20  Temp: 98.4 F (36.9 C) 97.7 F (36.5 C)  SpO2: 97% 92%    Gen: Awake, Alert, Oriented X 3,  HEENT: no JVD Lungs: Good air movement bilaterally, CTAB CVS: S1S2/RRR Abd: soft, Non tender, non distended, BS present Extremities: No edema Skin: no new rashes on exposed skin   Discharge Instructions   Discharge Instructions     Diet - low sodium heart healthy   Complete by: As directed    Increase activity slowly   Complete by: As directed       Allergies as of 08/19/2022       Reactions  Bee Pollen Swelling        Medication List     STOP taking these medications    dapagliflozin propanediol 10 MG Tabs tablet Commonly known as: FARXIGA   Entresto 24-26 MG Generic drug: sacubitril-valsartan   furosemide 40 MG tablet Commonly known as: Lasix       TAKE these medications     amiodarone 200 MG tablet Commonly known as: PACERONE Take 1 tablet (200 mg total) by mouth 2 (two) times daily. What changed:  how much to take when to take this   Aspirin Low Dose 81 MG tablet Generic drug: aspirin EC Take 1 tablet (81 mg total) by mouth daily at 6 (six) AM. Swallow whole.   Eliquis 5 MG Tabs tablet Generic drug: apixaban TAKE ONE TABLET BY MOUTH 2 TIMES A DAY   empagliflozin 10 MG Tabs tablet Commonly known as: JARDIANCE Take 1 tablet (10 mg total) by mouth daily. Start taking on: August 20, 2022   gabapentin 100 MG capsule Commonly known as: NEURONTIN Take 1 capsule (100 mg total) by mouth 3 (three) times daily.   levothyroxine 137 MCG tablet Commonly known as: SYNTHROID Take 137 mcg by mouth every morning.   MAGNESIUM OXIDE PO Take 1 tablet by mouth daily.   polyethylene glycol powder 17 GM/SCOOP powder Commonly known as: GLYCOLAX/MIRALAX Take 17 g by mouth daily. What changed:  when to take this reasons to take this   potassium chloride SA 20 MEQ tablet Commonly known as: KLOR-CON M Take 3 tablets (60 mEq total) by mouth 2 (two) times daily. What changed:  how much to take when to take this   rosuvastatin 20 MG tablet Commonly known as: CRESTOR Take 1 tablet (20 mg total) by mouth daily.   Torsemide 60 MG Tabs Take 60 mg by mouth daily. Start taking on: August 20, 2022       Allergies  Allergen Reactions   Bee Pollen Swelling    Follow-up Information     [facility] Follow up on 08/30/2022.   Specialty: Cardiology Why: Follow up in the Advanced Heart Failure Clinic 08/30/22 at 1030 Entrance C, Free valet Please bring all medications with you Contact information: [address] [place] [address] [phone]        Rica Records, FNP Follow up in 12 day(s).   Specialty: Family Medicine Why: Patient appointment rescheduled for Wednesday, September 01, 2023 at 10:20 am. Contact information: [address] [place] [address] [phone]                  The results of significant diagnostics from this hospitalization (including imaging, microbiology, ancillary and laboratory) are listed below for reference.    Significant Diagnostic Studies: DG CHEST PORT 1 VIEW  Result Date: 08/16/2022 CLINICAL DATA:  Evaluate central venous catheter placement EXAM: PORTABLE CHEST 1 VIEW COMPARISON:  08/14/2022 FINDINGS: There is a right IJ catheter with tip in the projection of the SVC. No pneumothorax visualized. Stable cardiomediastinal contours. Bilateral pleural effusions are identified, right greater than left. These appear similar to the previous exam. Mild interstitial prominence is also unchanged. IMPRESSION: 1. Right IJ catheter with tip in the projection of the SVC. No pneumothorax visualized. 2. No change in veil like opacification over bilateral lungs compatible with posterior layering pleural effusions. Electronically Signed   By: Signa Kell M.D.   On: 08/16/2022 12:16   DG CHEST PORT  1 VIEW  Result Date: 08/14/2022 CLINICAL DATA:  Dyspnea EXAM: PORTABLE CHEST 1 VIEW COMPARISON:  08/10/2022 FINDINGS: 2 frontal views of the chest demonstrate right internal jugular catheter tip overlying superior vena cava. The cardiac silhouette is stable. There are bibasilar veiling opacities, right greater than left, consistent with pleural effusion and/or consolidation. No pneumothorax. No acute bony abnormality. IMPRESSION: 1. Stable bibasilar veiling opacities, right greater than left, consistent with consolidation and/or effusion. Electronically Signed   By: Sharlet Salina M.D.   On: 08/14/2022 19:59   CARDIAC CATHETERIZATION  Result Date: 08/12/2022 Findings: On milrinone 0.125 RA = 12 RV = 52/12 PA = 58/32 (42) PCW = 34 (v = 45) Fick cardiac output/index = 5.3/2.6 PVR = 1.5 WU FA sat = 95% PA sat =62%, 63% PAPI =2.2 Assessment:  1. Elevated filling pressures with normal cardiac output on milrinone 2. Borderline PAPi Plan/Discussion: Resume IV lasix Arvilla Meres, MD 1:15 PM  DG Chest Port 1 View  Result Date: 08/10/2022 CLINICAL DATA:  Dyspnea, central line placement EXAM: PORTABLE CHEST 1 VIEW COMPARISON:  Previous studies including the examination of 08/08/2022 FINDINGS: Transverse diameter of heart is increased. Central pulmonary vessels are slightly less prominent. Small to moderate bilateral pleural effusions are seen, more so on the right side. There is placement of right IJ central venous catheter with its tip in the course of superior vena cava. There is no pneumothorax. IMPRESSION: Tip of right IJ central venous catheter is seen in superior vena cava. There is no pneumothorax. Cardiomegaly. There is decrease in pulmonary vascular congestion. Small to moderate bilateral pleural effusions, more so on the right side. Electronically Signed   By: Ernie Avena M.D.   On: 08/10/2022 14:38   ECHOCARDIOGRAM COMPLETE  Result Date: 08/09/2022    ECHOCARDIOGRAM REPORT   Patient Name:   [name] [name] Date of Exam: 08/09/2022 Medical Rec #:  [id number]        Height:       72.0 in Accession #:    [id number]       Weight:       181.2 lb Date of Birth:  1955/05/14       BSA:          2.043 m Patient Age:    66 years         BP:           94/76 mmHg Patient Gender: M                HR:           112 bpm. Exam Location:  Inpatient Procedure: 2D Echo, 3D Echo, Cardiac Doppler, Color Doppler and Intracardiac            Opacification Agent Indications:    I50.40* Unspecified combined systolic (congestive) and diastolic                 (congestive) heart failure  History:        Patient has prior history of Echocardiogram examinations, most                 recent 02/08/2022. CHF and Cardiomyopathy, Abnormal ECG,                 Arrythmias:Atrial Fibrillation; Risk Factors:Current Smoker and                 Hypertension. Apical  thrombus.  Sonographer:    Sheralyn Boatman RDCS Referring Phys: [id number] Tarri Abernethy [facility] IMPRESSIONS  1. Left ventricular ejection fraction, by estimation, is <20%. The left ventricle has severely decreased function. The left ventricle demonstrates global hypokinesis. The left ventricular internal cavity size was moderately to severely dilated. Left ventricular diastolic parameters are indeterminate.  2. Right ventricular systolic function is severely reduced. The right ventricular size is moderately enlarged. There is moderately elevated pulmonary artery systolic pressure.  3. Left atrial size was moderately dilated.  4. Right atrial size was moderately dilated.  5. The mitral valve is normal in structure. Moderate to severe mitral valve regurgitation.  6. Tricuspid valve regurgitation is moderate to severe.  7. The aortic valve is tricuspid. Aortic valve regurgitation is not visualized. Aortic valve sclerosis/calcification is present, without any evidence of aortic stenosis.  8. The inferior vena cava is dilated in size with <50% respiratory variability, suggesting right atrial pressure of 15 mmHg. FINDINGS  Left Ventricle: Left ventricular ejection fraction, by estimation, is <20%. The left ventricle has severely decreased function. The left ventricle demonstrates global hypokinesis. Definity contrast agent was given IV to delineate the left ventricular endocardial borders. The left ventricular internal cavity size was moderately to severely dilated. There is no left ventricular hypertrophy. Left ventricular diastolic parameters are indeterminate. Right Ventricle: The right ventricular size is moderately enlarged. Right vetricular wall thickness was not assessed. Right ventricular systolic function is severely reduced. There is moderately elevated pulmonary artery systolic pressure. The tricuspid regurgitant velocity is 2.96 m/s, and with an assumed right atrial pressure of 15 mmHg, the estimated right ventricular systolic  pressure is 50.0 mmHg. Left Atrium: Left atrial size was moderately dilated. Right Atrium: Right atrial size was moderately dilated. Pericardium: Trivial pericardial effusion is present. Mitral Valve: The mitral valve is normal in structure. Moderate to severe mitral valve regurgitation. Tricuspid Valve: The tricuspid valve is normal in structure. Tricuspid valve regurgitation is moderate to severe. Aortic Valve: The aortic valve is tricuspid. Aortic valve regurgitation is not visualized. Aortic valve sclerosis/calcification is present, without any evidence of aortic stenosis. Pulmonic Valve: The pulmonic valve was normal in structure. Pulmonic valve regurgitation is mild. Aorta: The aortic root and ascending aorta are structurally normal, with no evidence of dilitation. Venous: The inferior vena cava is dilated in size with less than 50% respiratory variability, suggesting right atrial pressure of 15 mmHg. IAS/Shunts: No atrial level shunt detected by color flow Doppler.  LEFT VENTRICLE PLAX 2D LVIDd:         6.00 cm   Diastology LVIDs:         5.70 cm   LV e' medial:    2.54 cm/s LV PW:         1.10 cm   LV E/e' medial:  53.9 LV IVS:        1.20 cm   LV e' lateral:   7.05 cm/s LVOT diam:     2.50 cm   LV E/e' lateral: 19.4 LV SV:         34 LV SV Index:   17 LVOT Area:     4.91 cm  RIGHT VENTRICLE            IVC RV S prime:     5.51 cm/s  IVC diam: 2.40 cm TAPSE (M-mode): 1.0 cm LEFT ATRIUM           Index        RIGHT ATRIUM           Index LA diam:      4.70 cm 2.30 cm/m  RA Area:     22.10 cm LA Vol (A2C): 85.8 ml 41.99 ml/m  RA Volume:   77.70 ml  38.03 ml/m LA Vol (A4C): 75.7 ml 37.05 ml/m  AORTIC VALVE             PULMONIC VALVE LVOT Vmax:   64.90 cm/s  PR End Diast Vel: 2.08 msec LVOT Vmean:  38.600 cm/s LVOT VTI:    0.070 m  AORTA Ao Root diam: 3.00 cm Ao Asc diam:  3.70 cm MITRAL VALVE                TRICUSPID VALVE MV Area (PHT): 4.49 cm     TR Peak grad:   35.0 mmHg MV Decel Time: 169 msec      TR Vmax:        296.00 cm/s MV E velocity: 137.00 cm/s                             SHUNTS                             Systemic VTI:  0.07 m                             Systemic Diam: 2.50 cm Dietrich Pates MD Electronically signed by Dietrich Pates MD Signature Date/Time: 08/09/2022/4:46:15 PM    Final    DG Chest Port 1 View  Result Date: 08/08/2022 CLINICAL DATA:  Shortness of breath with nonproductive cough. History of congestive heart failure. EXAM: PORTABLE CHEST 1 VIEW COMPARISON:  Radiographs 04/19/2022 and 02/08/2022.  CT 03/12/2020. FINDINGS: 1213 hours. Stable cardiomegaly, vascular congestion and right greater than left pleural effusions. Bibasilar airspace opacities appear unchanged. There is no pneumothorax. The bones appear unchanged. Multiple telemetry leads overlie the chest. IMPRESSION: Unchanged appearance of the chest with cardiomegaly, vascular congestion and bilateral pleural effusions likely due to chronic congestive heart failure. Electronically Signed   By: Carey Bullocks M.D.   On: 08/08/2022 12:27    Microbiology: No results found for this or any previous visit (from the past 240 hour(s)).   Labs: Basic Metabolic Panel: Recent Labs  Lab 08/14/22 0330 08/14/22 2002 08/15/22 0445 08/15/22 1525 08/16/22 0404 08/17/22 0246 08/18/22 0411 08/19/22 0355  NA 129*   < > 132* 135 135 140 137 137  K 5.8*   < > 3.7 3.8 3.3* 3.4* 3.4* 3.8  CL 91*   < > 91* 91* 94* 97* 96* 98  CO2 27   < > 28 29 31  32 30 28  GLUCOSE 274*   < > 136* 120* 123* 150* 130* 109*  BUN 50*   < > 61* 62* 59* 57* 55* 55*  CREATININE 2.53*   < > 2.75* 2.86* 2.71* 2.58* 2.13* 2.23*  CALCIUM 8.8*   < > 8.6* 8.4* 8.4* 8.5* 8.6* 8.7*  MG 2.3  --  2.4  --  2.2 2.3 2.3  --    < > = values in this interval not displayed.   Liver Function Tests: No results for input(s): "AST", "ALT", "ALKPHOS", "BILITOT", "PROT", "ALBUMIN" in the last 168 hours. No results for input(s): "LIPASE", "AMYLASE" in the last 168  hours. No results for input(s): "AMMONIA" in the last 168 hours. CBC: Recent Labs  Lab 08/13/22 0540 08/14/22 0330 08/15/22 0445 08/16/22 0404 08/17/22 0246  WBC 6.8 7.7 8.0 7.0 6.5  HGB 11.9* 12.2* 10.9* 11.0* 11.3*  HCT 37.2* 38.4* 33.3* 34.3* 35.9*  MCV 98.7 98.0 96.8 98.3 98.4  PLT 242 238 225 217 239   Cardiac Enzymes: No results for input(s): "CKTOTAL", "CKMB", "CKMBINDEX", "TROPONINI" in the last 168 hours. BNP: BNP (last 3 results) Recent Labs    04/23/22 0638 08/08/22 1211 08/10/22 0926  BNP 1,260.0* >4,500.0* 2,475.0*    ProBNP (last 3 results) No results for input(s): "PROBNP" in the last 8760 hours.  CBG: Recent Labs  Lab 08/18/22 1131 08/18/22 1616 08/18/22 2103 08/19/22 0559 08/19/22 1121  GLUCAP 158* 197* 214* 157* 111*       Signed:  Zannie Cove MD.  [facility] 08/19/2022, 12:01 PM

## 2022-08-19 NOTE — Care Management Important Message (Signed)
Important Message  Patient Details  Name: [name] [name] MRN: [id number] Date of Birth: 02/08/1955   Medicare Important Message Given:  Yes     Renie Ora 08/19/2022, 12:11 PM

## 2022-08-19 NOTE — TOC Progression Note (Signed)
Transition of Care [facility]) - Progression Note    Patient Details  Name: [name] [name] MRN: [id number] Date of Birth: 1955/10/02  Transition of Care [facility]) CM/SW Contact  Reva Bores, LCSWA Phone Number: 08/19/2022, 10:12 AM  Clinical Narrative:  CSW met with the pt at the bedside. CSW provided a [facility] [employer] list to pt. Pt stated that he is also concerned about being able to afford his medications. CSW explained that she would reach out to the HF team and pharmacist to see if there is any assistance available for pts medication. TOC will continue to follow.     Expected Discharge Plan: Home/Self Care Barriers to Discharge: Continued Medical Work up  Expected Discharge Plan and Services In-house Referral: Clinical Social Work     Living arrangements for the past 2 months: Single Family Home                           HH Arranged: Patient Refused [facility]           Social Determinants of Health (SDOH) Interventions SDOH Screenings   Food Insecurity: No Food Insecurity (08/12/2022)  Housing: Low Risk  (04/20/2022)  Transportation Needs: No Transportation Needs (08/12/2022)  Utilities: Not At Risk (08/12/2022)  Alcohol Screen: Low Risk  (04/07/2022)  Depression (PHQ2-9): High Risk (04/30/2022)  Financial Resource Strain: Medium Risk (04/07/2022)  Physical Activity: Inactive (04/07/2022)  Social Connections: Moderately Isolated (04/07/2022)  Stress: No Stress Concern Present (04/07/2022)  Tobacco Use: High Risk (08/08/2022)    Readmission Risk Interventions    08/09/2022    8:24 AM 04/20/2022   11:07 AM 02/10/2022    2:42 PM  Readmission Risk Prevention Plan  Transportation Screening Complete Complete Complete  PCP or Specialist Appt within 3-5 Days   --  HRI or Home Care Consult  Complete Complete  Social Work Consult for Recovery Care Planning/Counseling  Complete Complete  Palliative Care Screening  Not Applicable Not Applicable  Medication Review Furniture conservator/restorer) Complete Complete Complete  HRI or Home Care Consult Complete    SW Recovery Care/Counseling Consult Complete    Palliative Care Screening Not Applicable    Skilled Nursing Facility Not Applicable

## 2022-08-19 NOTE — Progress Notes (Signed)
Physical Therapy Treatment Patient Details Name: [name] [name] MRN: [id number] DOB: 30-Sep-1955 Today's Date: 08/19/2022   History of Present Illness Pt is 67 year old presented to [facility] on  08/08/22 for acute on chronic heart failure and cardiogenic shock. PMH - CHF, CKD 3, HTN, LV thrombus, PAF, T2DM.    PT Comments  Patient is agreeable to PT session. He was seated at the sink shaving on arrival to the room. Mild staggering to the right initially when walking in the hallway. Patient reports leg fatigue with ambulation with no standing rest breaks required with hallway walking. Patient educated on energy conservation techniques to use in home setting. PT will continue to follow.    If plan is discharge home, recommend the following: Assist for transportation   Can travel by private vehicle        Equipment Recommendations  None recommended by PT    Recommendations for Other Services       Precautions / Restrictions Precautions Precautions: Fall Restrictions Weight Bearing Restrictions: No     Mobility  Bed Mobility Overal bed mobility: Modified Independent                  Transfers Overall transfer level: Needs assistance Equipment used: None Transfers: Sit to/from Stand Sit to Stand: Supervision           General transfer comment: increased time, supervision for line management    Ambulation/Gait Ambulation/Gait assistance: Min guard, Supervision Gait Distance (Feet): 350 Feet Assistive device: None Gait Pattern/deviations: Decreased stride length, Staggering right Gait velocity: decreased     General Gait Details: mild staggering to the right when initially getting into the hallway. Min gaurd to supervision provided for safety. patient reports generalized weakness with cues to take rest breaks as needed, which patient declined the need for. educated patient on energy conservation strategies to use in home setting   Stairs              Wheelchair Mobility     Tilt Bed    Modified Rankin (Stroke Patients Only)       Balance Overall balance assessment: Needs assistance Sitting-balance support: Feet supported Sitting balance-Leahy Scale: Good     Standing balance support: No upper extremity supported Standing balance-Leahy Scale: Fair                              Cognition Arousal/Alertness: Awake/alert Behavior During Therapy: WFL for tasks assessed/performed Overall Cognitive Status: Within Functional Limits for tasks assessed                                          Exercises      General Comments        Pertinent Vitals/Pain Pain Assessment Pain Assessment: No/denies pain    Home Living                          Prior Function            PT Goals (current goals can now be found in the care plan section) Acute Rehab PT Goals Patient Stated Goal: to return home PT Goal Formulation: With patient Time For Goal Achievement: 08/29/22 Potential to Achieve Goals: Good Progress towards PT goals: Progressing toward goals    Frequency    Min 1X/week  PT Plan Current plan remains appropriate    Co-evaluation              AM-PAC PT "6 Clicks" Mobility   Outcome Measure  Help needed turning from your back to your side while in a flat bed without using bedrails?: None Help needed moving from lying on your back to sitting on the side of a flat bed without using bedrails?: None Help needed moving to and from a bed to a chair (including a wheelchair)?: A Little Help needed standing up from a chair using your arms (e.g., wheelchair or bedside chair)?: A Little Help needed to walk in hospital room?: A Little Help needed climbing 3-5 steps with a railing? : A Little 6 Click Score: 20    End of Session   Activity Tolerance: Patient tolerated treatment well Patient left: in bed;with call bell/phone within reach;with SCD's reapplied Nurse  Communication: Mobility status PT Visit Diagnosis: Other abnormalities of gait and mobility (R26.89);Muscle weakness (generalized) (M62.81)     Time: 7824-2353 PT Time Calculation (min) (ACUTE ONLY): 25 min  Charges:    $Therapeutic Activity: 23-37 mins PT General Charges $$ ACUTE PT VISIT: 1 Visit                     Donna Bernard, PT, MPT    Ina Homes 08/19/2022, 12:16 PM

## 2022-08-19 NOTE — Plan of Care (Signed)
Nutrition Education Note  RD consulted for nutrition education regarding CHF and diabetes.  Lab Results  Component Value Date   HGBA1C 6.6 (H) 08/09/2022     RD provided "Heart Healthy, Consistent Carbohydrate Nutrition Therapy" handout from the Academy of Nutrition and Dietetics. Reviewed patient's dietary recall. Provided examples on ways to decrease sodium intake in diet. Discouraged intake of processed foods and use of salt shaker. Encouraged fresh fruits and vegetables as well as whole grain sources of carbohydrates to maximize fiber intake.   RD discussed why it is important for patient to adhere to diet recommendations, and emphasized the role of fluids, foods to avoid, and importance of weighing self daily.   Discussed different food groups and their effects on blood sugar, emphasizing carbohydrate-containing foods. Provided list of carbohydrates and recommended serving sizes of common foods.  Discussed importance of controlled and consistent carbohydrate intake throughout the day. Provided examples of ways to balance meals/snacks and encouraged intake of high-fiber, whole grain complex carbohydrates. Teach back method used.  Expect moderate compliance.  Body mass index is 22.47 kg/m. Pt meets criteria for normal based on current BMI.  Current diet order is Healthy Heart with 1500 ml FR, patient is consuming approximately 75-100% of meals at this time. Labs and medications reviewed. No further nutrition interventions warranted at this time. RD contact information provided. If additional nutrition issues arise, please re-consult RD.   Leodis Rains, RDN, LDN  Clinical Nutrition

## 2022-08-20 ENCOUNTER — Telehealth: Payer: Self-pay | Admitting: Family Medicine

## 2022-08-20 ENCOUNTER — Encounter: Payer: Self-pay | Admitting: *Deleted

## 2022-08-20 ENCOUNTER — Telehealth: Payer: Self-pay | Admitting: *Deleted

## 2022-08-20 ENCOUNTER — Other Ambulatory Visit: Payer: Self-pay

## 2022-08-20 ENCOUNTER — Telehealth (HOSPITAL_COMMUNITY): Payer: Self-pay

## 2022-08-20 DIAGNOSIS — R0602 Shortness of breath: Secondary | ICD-10-CM

## 2022-08-20 MED ORDER — ALBUTEROL SULFATE HFA 108 (90 BASE) MCG/ACT IN AERS: 2.0000 | INHALATION_SPRAY | Freq: Four times a day (QID) | RESPIRATORY_TRACT | 2 refills | Status: DC | PRN

## 2022-08-20 NOTE — Transitions of Care (Post Inpatient/ED Visit) (Signed)
   08/20/2022  Name: GALAN GHEE MRN: [id number] DOB: Jun 02, 1955  Today's TOC FU Call Status: Today's TOC FU Call Status:: Unsuccessful Call (1st Attempt) Unsuccessful Call (1st Attempt) Date: 08/20/22  Attempted to reach the patient regarding the most recent Inpatient visit; left HIPAA compliant voice message requesting call back  Follow Up Plan: Additional outreach attempts will be made to reach the patient to complete the Transitions of Care (Post Inpatient visit) call.   Caryl Pina, RN, BSN, CCRN Alumnus RN CM Care Coordination/ Transition of Care- [facility] Care Management [phone]: direct office

## 2022-08-20 NOTE — Telephone Encounter (Signed)
Spoke to pt, states he used to be on albuterol inhaler, noted on historical medication, ok per iliana to send.

## 2022-08-20 NOTE — Telephone Encounter (Signed)
Case worker Lupita Leash from [facility] calling states patient has COPD recommending a rescue inhaler. Please advise, thank you

## 2022-08-20 NOTE — Telephone Encounter (Signed)
Lupita Leash from [facility] called and left message on the answering machine stating that patient had a post discharge medication reconciliation and they would like to verify one medication that patient should not be taking.    Attempted to call but unable to reach. Left message to call back to office.

## 2022-08-22 ENCOUNTER — Other Ambulatory Visit: Payer: Self-pay | Admitting: Family Medicine

## 2022-08-23 ENCOUNTER — Telehealth: Payer: Self-pay | Admitting: *Deleted

## 2022-08-23 ENCOUNTER — Telehealth: Payer: Self-pay | Admitting: Family Medicine

## 2022-08-23 ENCOUNTER — Encounter: Payer: Self-pay | Admitting: *Deleted

## 2022-08-23 NOTE — Transitions of Care (Post Inpatient/ED Visit) (Signed)
   08/23/2022  Name: [name] [name] MRN: [id number] DOB: 19-Nov-1955  Today's TOC FU Call Status: Today's TOC FU Call Status:: Successful TOC FU Call Completed TOC FU Call Complete Date: 08/23/22  Transition Care Management Follow-up Telephone Call  Spoke with patient's nephew Claiborne Billings-- he reports patient passed away yesterday while at home - condolences offered, PCP team made aware  Items Reviewed:  Interventions Today    Flowsheet Row Most Recent Value  Chronic Disease   Chronic disease during today's visit Congestive Heart Failure (CHF)  General Interventions   General Interventions Discussed/Reviewed Communication with  Communication with PCP/Specialists        Medications Reviewed Today: Medications Reviewed Today     Reviewed by Michaela Corner, RN (Registered Nurse) on 08/23/22 at 1007  Med List Status: <None>   Medication Order Taking? Sig Documenting Provider Last Dose Status Informant  albuterol (VENTOLIN HFA) 108 (90 Base) MCG/ACT inhaler [id number]  Inhale 2 puffs into the lungs every 6 (six) hours as needed for wheezing or shortness of breath. Del Newman Nip, Tenna Child, FNP  Active   amiodarone (PACERONE) 200 MG tablet [id number]  Take 1 tablet (200 mg total) by mouth 2 (two) times daily. Zannie Cove, MD  Active   aspirin EC 81 MG tablet [id number] No Take 1 tablet (81 mg total) by mouth daily at 6 (six) AM. Swallow whole. Vonna Drafts, MD 08/08/2022 Active Pharmacy Records, Self  ELIQUIS 5 MG TABS tablet [id number] No TAKE ONE TABLET BY MOUTH 2 TIMES A DAY Antoine Poche, MD 08/08/2022 0830 Active Self, Pharmacy Records  empagliflozin (JARDIANCE) 10 MG TABS tablet [id number]  Take 1 tablet (10 mg total) by mouth daily. Zannie Cove, MD  Active   gabapentin (NEURONTIN) 100 MG capsule [id number]  Take 1 capsule (100 mg total) by mouth 3 (three) times daily. Zannie Cove, MD  Active   levothyroxine (SYNTHROID) 137 MCG tablet [id number] No Take 137 mcg by  mouth every morning. [provider] 08/08/2022 Active Pharmacy Records, Self  MAGNESIUM OXIDE PO [id number] No Take 1 tablet by mouth daily. [provider] 08/08/2022 Active Self, Pharmacy Records  polyethylene glycol powder (GLYCOLAX/MIRALAX) 17 GM/SCOOP powder [id number] No Take 17 g by mouth daily.  Patient taking differently: Take 17 g by mouth daily as needed (constipation).   Erick Alley, DO [facility] Active Pharmacy Records, Self  potassium chloride SA (KLOR-CON M) 20 MEQ tablet [id number]  Take 3 tablets (60 mEq total) by mouth 2 (two) times daily. Zannie Cove, MD  Active   rosuvastatin (CRESTOR) 20 MG tablet [id number]  Take 1 tablet (20 mg total) by mouth daily. Zannie Cove, MD  Active   torsemide (DEMADEX) 20 MG tablet [id number]  Take 80mg  (4tabs) in am and 20mg  (1tab) at dinner time Zannie Cove, MD  Active             Home Care and Equipment/Supplies:    Functional Questionnaire:    Follow up appointments reviewed:   Caryl Pina, RN, BSN, CCRN Alumnus RN CM Care Coordination/ Transition of Care- [facility] Care Management [phone]: direct office

## 2022-08-23 NOTE — Telephone Encounter (Signed)
[facility] calling says [name] [name] is not coming up in their system. Please advise Thank you

## 2022-08-23 NOTE — Telephone Encounter (Signed)
Received phone call from [facility] patient passed away at his resident on 08.04.2024 and needs Henreitta Leber to sign death certificate. [facility] call back # [phone]

## 2022-08-23 NOTE — Telephone Encounter (Signed)
Left message for Lupita Leash from [facility] to call the office back

## 2022-08-23 NOTE — Telephone Encounter (Signed)
Contacted funeral home to retrieve case ID. Director states this case has not been routed to [place] for sign off. CASE ID [id number]

## 2022-08-24 ENCOUNTER — Encounter (INDEPENDENT_AMBULATORY_CARE_PROVIDER_SITE_OTHER): Payer: Self-pay | Admitting: *Deleted

## 2022-08-24 NOTE — Telephone Encounter (Signed)
Spoke with Boris Lown and left message for Director to process DC under supervising MD of APP. Direct contact # for myself has been provided.

## 2022-08-25 NOTE — Telephone Encounter (Signed)
[facility] CASE ID [id number]  [facility] could not locate Polanco, FNP in the system and assigned cert to Dr. Lodema Hong.  Case is ready to be certified by physician.

## 2022-08-25 NOTE — Telephone Encounter (Signed)
Per chart review patient passed away on 08-28-22

## 2022-08-25 NOTE — Telephone Encounter (Signed)
Called funeral home to gather information for death cert. Awaiting call back from Director regarding cause of death.

## 2022-08-27 ENCOUNTER — Telehealth (HOSPITAL_COMMUNITY): Payer: Self-pay

## 2022-08-27 NOTE — Telephone Encounter (Signed)
Called and left patient a detailed voice message to confirm/remind patient of their appointment [facility] on [facility].   And to bring in all medications and/or complete list.

## 2022-08-30 ENCOUNTER — Encounter (HOSPITAL_COMMUNITY): Payer: Medicare HMO

## 2022-09-01 ENCOUNTER — Inpatient Hospital Stay: Payer: Medicare HMO | Admitting: Family Medicine

## 2022-09-19 DEATH — deceased

## 2022-10-22 ENCOUNTER — Other Ambulatory Visit: Payer: Self-pay | Admitting: Family Medicine

## 2022-10-22 DIAGNOSIS — Z1212 Encounter for screening for malignant neoplasm of rectum: Secondary | ICD-10-CM

## 2022-10-22 DIAGNOSIS — Z1211 Encounter for screening for malignant neoplasm of colon: Secondary | ICD-10-CM

## 2023-01-05 ENCOUNTER — Encounter (HOSPITAL_COMMUNITY): Payer: Medicare HMO

## 2023-01-05 ENCOUNTER — Ambulatory Visit: Payer: Medicare HMO

## 2023-10-12 ENCOUNTER — Other Ambulatory Visit (HOSPITAL_COMMUNITY): Payer: Self-pay
